# Patient Record
Sex: Female | Born: 1960
Health system: Southern US, Community
[De-identification: ages and names within clinical notes are randomized; demographics above are authoritative.]

## PROBLEM LIST (undated history)

## (undated) ENCOUNTER — Emergency Department (HOSPITAL_COMMUNITY): Payer: Self-pay | Source: Home / Self Care

## (undated) DIAGNOSIS — I471 Supraventricular tachycardia, unspecified: Secondary | ICD-10-CM

## (undated) DIAGNOSIS — M5090 Cervical disc disorder, unspecified, unspecified cervical region: Secondary | ICD-10-CM

## (undated) DIAGNOSIS — Z9889 Other specified postprocedural states: Secondary | ICD-10-CM

## (undated) DIAGNOSIS — G4730 Sleep apnea, unspecified: Secondary | ICD-10-CM

## (undated) DIAGNOSIS — G629 Polyneuropathy, unspecified: Secondary | ICD-10-CM

## (undated) DIAGNOSIS — E119 Type 2 diabetes mellitus without complications: Secondary | ICD-10-CM

## (undated) DIAGNOSIS — I252 Old myocardial infarction: Secondary | ICD-10-CM

## (undated) DIAGNOSIS — S3210XA Unspecified fracture of sacrum, initial encounter for closed fracture: Secondary | ICD-10-CM

## (undated) DIAGNOSIS — M4800 Spinal stenosis, site unspecified: Secondary | ICD-10-CM

## (undated) DIAGNOSIS — D649 Anemia, unspecified: Secondary | ICD-10-CM

## (undated) DIAGNOSIS — S2239XA Fracture of one rib, unspecified side, initial encounter for closed fracture: Secondary | ICD-10-CM

## (undated) DIAGNOSIS — I10 Essential (primary) hypertension: Secondary | ICD-10-CM

## (undated) DIAGNOSIS — I509 Heart failure, unspecified: Secondary | ICD-10-CM

## (undated) DIAGNOSIS — I890 Lymphedema, not elsewhere classified: Secondary | ICD-10-CM

## (undated) DIAGNOSIS — J449 Chronic obstructive pulmonary disease, unspecified: Secondary | ICD-10-CM

## (undated) DIAGNOSIS — K219 Gastro-esophageal reflux disease without esophagitis: Secondary | ICD-10-CM

## (undated) HISTORY — DX: Polyneuropathy, unspecified: G62.9

## (undated) HISTORY — DX: Gastro-esophageal reflux disease without esophagitis: K21.9

## (undated) HISTORY — DX: Cervical disc disorder, unspecified, unspecified cervical region: M50.90

## (undated) HISTORY — DX: Spinal stenosis, site unspecified: M48.00

## (undated) HISTORY — DX: Other specified postprocedural states: Z98.890

## (undated) HISTORY — DX: Essential (primary) hypertension: I10

## (undated) HISTORY — DX: Supraventricular tachycardia: I47.1

## (undated) HISTORY — DX: Supraventricular tachycardia, unspecified: I47.10

## (undated) HISTORY — DX: Old myocardial infarction: I25.2

---

## 2000-12-20 ENCOUNTER — Emergency Department (HOSPITAL_COMMUNITY): Admission: EM | Admit: 2000-12-20 | Discharge: 2000-12-20 | Payer: Self-pay | Admitting: *Deleted

## 2000-12-20 ENCOUNTER — Encounter: Payer: Self-pay | Admitting: *Deleted

## 2001-11-22 ENCOUNTER — Emergency Department (HOSPITAL_COMMUNITY): Admission: EM | Admit: 2001-11-22 | Discharge: 2001-11-23 | Payer: Self-pay

## 2002-12-03 ENCOUNTER — Encounter: Payer: Self-pay | Admitting: *Deleted

## 2002-12-03 ENCOUNTER — Emergency Department (HOSPITAL_COMMUNITY): Admission: EM | Admit: 2002-12-03 | Discharge: 2002-12-03 | Payer: Self-pay | Admitting: Emergency Medicine

## 2007-05-29 ENCOUNTER — Emergency Department (HOSPITAL_COMMUNITY): Admission: EM | Admit: 2007-05-29 | Discharge: 2007-05-29 | Payer: Self-pay | Admitting: Emergency Medicine

## 2009-07-29 ENCOUNTER — Emergency Department (HOSPITAL_COMMUNITY): Admission: EM | Admit: 2009-07-29 | Discharge: 2009-07-29 | Payer: Self-pay | Admitting: Emergency Medicine

## 2009-09-05 ENCOUNTER — Emergency Department (HOSPITAL_COMMUNITY): Admission: EM | Admit: 2009-09-05 | Discharge: 2009-09-05 | Payer: Self-pay | Admitting: Emergency Medicine

## 2010-04-25 ENCOUNTER — Encounter: Payer: Self-pay | Admitting: General Surgery

## 2010-06-21 LAB — BASIC METABOLIC PANEL
BUN: 9 mg/dL (ref 6–23)
CO2: 26 mEq/L (ref 19–32)
Calcium: 9.9 mg/dL (ref 8.4–10.5)
Chloride: 100 mEq/L (ref 96–112)
Creatinine, Ser: 0.63 mg/dL (ref 0.4–1.2)
GFR calc Af Amer: >60 mL/min (ref >60)
GFR calc non Af Amer: >60 mL/min (ref >60)
Glucose, Bld: 111 mg/dL — ABNORMAL HIGH (ref 70–99)
Potassium: 3 mEq/L — ABNORMAL LOW (ref 3.5–5.1)
Sodium: 136 mEq/L (ref 135–145)

## 2010-06-21 LAB — DIFFERENTIAL
Basophils Absolute: 0.1 10*3/uL (ref 0.0–0.1)
Basophils Relative: 1 % (ref 0–1)
Eosinophils Absolute: 0.5 10*3/uL (ref 0.0–0.7)
Eosinophils Relative: 5 % (ref 0–5)
Lymphocytes Relative: 23 % (ref 12–46)
Lymphs Abs: 2.1 10*3/uL (ref 0.7–4.0)
Monocytes Absolute: 0.7 10*3/uL (ref 0.1–1.0)
Monocytes Relative: 7 % (ref 3–12)
Neutro Abs: 5.9 10*3/uL (ref 1.7–7.7)
Neutrophils Relative %: 65 % (ref 43–77)

## 2010-06-21 LAB — CK TOTAL AND CKMB (NOT AT ARMC)
CK, MB: 0.7 ng/mL (ref 0.3–4.0)
Relative Index: INVALID (ref 0.0–2.5)
Total CK: 51 U/L (ref 7–177)

## 2010-06-21 LAB — CBC
HCT: 40.4 % (ref 36.0–46.0)
Hemoglobin: 14.2 g/dL (ref 12.0–15.0)
MCHC: 35.1 g/dL (ref 30.0–36.0)
MCV: 98.8 fL (ref 78.0–100.0)
Platelets: 283 10*3/uL (ref 150–400)
RBC: 4.09 MIL/uL (ref 3.87–5.11)
RDW: 13.5 % (ref 11.5–15.5)
WBC: 9.1 10*3/uL (ref 4.0–10.5)

## 2010-06-21 LAB — POCT CARDIAC MARKERS
CKMB, poc: <1 ng/mL — ABNORMAL LOW (ref 1.0–8.0)
Myoglobin, poc: 43.4 ng/mL (ref 12–200)
Troponin i, poc: <0.05 ng/mL (ref 0.00–0.09)

## 2010-06-21 LAB — D-DIMER, QUANTITATIVE: D-Dimer, Quant: 0.51 ug/mL-FEU — ABNORMAL HIGH (ref 0.00–0.48)

## 2010-06-21 LAB — TROPONIN I: Troponin I: <0.01 ng/mL (ref 0.00–0.06)

## 2010-06-22 LAB — DIFFERENTIAL
Basophils Absolute: 0 10*3/uL (ref 0.0–0.1)
Basophils Relative: 0 % (ref 0–1)
Eosinophils Absolute: 0.5 10*3/uL (ref 0.0–0.7)
Eosinophils Relative: 4 % (ref 0–5)
Lymphocytes Relative: 30 % (ref 12–46)
Lymphs Abs: 3.4 10*3/uL (ref 0.7–4.0)
Monocytes Absolute: 0.6 10*3/uL (ref 0.1–1.0)
Monocytes Relative: 5 % (ref 3–12)
Neutro Abs: 6.8 10*3/uL (ref 1.7–7.7)
Neutrophils Relative %: 61 % (ref 43–77)

## 2010-06-22 LAB — URINALYSIS, ROUTINE W REFLEX MICROSCOPIC
Bilirubin Urine: NEGATIVE
Glucose, UA: NEGATIVE mg/dL
Ketones, ur: NEGATIVE mg/dL
Leukocytes, UA: NEGATIVE
Nitrite: NEGATIVE
Protein, ur: NEGATIVE mg/dL
Specific Gravity, Urine: 1.02 (ref 1.005–1.030)
Urobilinogen, UA: 1 mg/dL (ref 0.0–1.0)
pH: 7 (ref 5.0–8.0)

## 2010-06-22 LAB — COMPREHENSIVE METABOLIC PANEL
ALT: 14 U/L (ref 0–35)
AST: 19 U/L (ref 0–37)
Albumin: 4.4 g/dL (ref 3.5–5.2)
Alkaline Phosphatase: 43 U/L (ref 39–117)
BUN: 9 mg/dL (ref 6–23)
CO2: 22 mEq/L (ref 19–32)
Calcium: 9.7 mg/dL (ref 8.4–10.5)
Chloride: 106 mEq/L (ref 96–112)
Creatinine, Ser: 0.62 mg/dL (ref 0.4–1.2)
GFR calc Af Amer: >60 mL/min (ref >60)
GFR calc non Af Amer: >60 mL/min (ref >60)
Glucose, Bld: 107 mg/dL — ABNORMAL HIGH (ref 70–99)
Potassium: 3.7 mEq/L (ref 3.5–5.1)
Sodium: 138 mEq/L (ref 135–145)
Total Bilirubin: 1 mg/dL (ref 0.3–1.2)
Total Protein: 7.1 g/dL (ref 6.0–8.3)

## 2010-06-22 LAB — URINE MICROSCOPIC-ADD ON

## 2010-06-22 LAB — CBC
HCT: 40.7 % (ref 36.0–46.0)
Hemoglobin: 14.4 g/dL (ref 12.0–15.0)
MCHC: 35.4 g/dL (ref 30.0–36.0)
MCV: 97 fL (ref 78.0–100.0)
Platelets: 219 10*3/uL (ref 150–400)
RBC: 4.2 MIL/uL (ref 3.87–5.11)
RDW: 13.3 % (ref 11.5–15.5)
WBC: 11.2 10*3/uL — ABNORMAL HIGH (ref 4.0–10.5)

## 2010-06-22 LAB — SEDIMENTATION RATE: Sed Rate: 3 mm/hr (ref 0–22)

## 2011-06-01 ENCOUNTER — Encounter (HOSPITAL_COMMUNITY): Payer: Self-pay | Admitting: Emergency Medicine

## 2011-06-01 ENCOUNTER — Emergency Department (HOSPITAL_COMMUNITY)
Admission: EM | Admit: 2011-06-01 | Discharge: 2011-06-01 | Disposition: A | Discharge status: Home / Self Care | Payer: Self-pay | Attending: Emergency Medicine | Admitting: Emergency Medicine

## 2011-06-01 ENCOUNTER — Other Ambulatory Visit: Payer: Self-pay

## 2011-06-01 ENCOUNTER — Emergency Department (HOSPITAL_COMMUNITY): Payer: Self-pay

## 2011-06-01 DIAGNOSIS — Z87891 Personal history of nicotine dependence: Secondary | ICD-10-CM | POA: Insufficient documentation

## 2011-06-01 DIAGNOSIS — I498 Other specified cardiac arrhythmias: Secondary | ICD-10-CM | POA: Insufficient documentation

## 2011-06-01 DIAGNOSIS — I10 Essential (primary) hypertension: Secondary | ICD-10-CM | POA: Insufficient documentation

## 2011-06-01 DIAGNOSIS — J4489 Other specified chronic obstructive pulmonary disease: Secondary | ICD-10-CM | POA: Insufficient documentation

## 2011-06-01 DIAGNOSIS — R072 Precordial pain: Secondary | ICD-10-CM | POA: Insufficient documentation

## 2011-06-01 DIAGNOSIS — J449 Chronic obstructive pulmonary disease, unspecified: Secondary | ICD-10-CM | POA: Insufficient documentation

## 2011-06-01 HISTORY — DX: Chronic obstructive pulmonary disease, unspecified: J44.9

## 2011-06-01 LAB — LIPASE, BLOOD: Lipase: 45 U/L (ref 11–59)

## 2011-06-01 LAB — COMPREHENSIVE METABOLIC PANEL
ALT: 18 U/L (ref 0–35)
AST: 18 U/L (ref 0–37)
Albumin: 4.4 g/dL (ref 3.5–5.2)
Alkaline Phosphatase: 59 U/L (ref 39–117)
BUN: 11 mg/dL (ref 6–23)
CO2: 21 mEq/L (ref 19–32)
Calcium: 9.6 mg/dL (ref 8.4–10.5)
Chloride: 102 mEq/L (ref 96–112)
Creatinine, Ser: 0.68 mg/dL (ref 0.50–1.10)
GFR calc Af Amer: >90 mL/min (ref >90)
GFR calc non Af Amer: >90 mL/min (ref >90)
Glucose, Bld: 101 mg/dL — ABNORMAL HIGH (ref 70–99)
Potassium: 3.3 mEq/L — ABNORMAL LOW (ref 3.5–5.1)
Sodium: 138 mEq/L (ref 135–145)
Total Bilirubin: 0.6 mg/dL (ref 0.3–1.2)
Total Protein: 7.9 g/dL (ref 6.0–8.3)

## 2011-06-01 LAB — CBC
HCT: 40.8 % (ref 36.0–46.0)
Hemoglobin: 14.1 g/dL (ref 12.0–15.0)
MCH: 32.7 pg (ref 26.0–34.0)
MCHC: 34.6 g/dL (ref 30.0–36.0)
MCV: 94.7 fL (ref 78.0–100.0)
Platelets: 262 10*3/uL (ref 150–400)
RBC: 4.31 MIL/uL (ref 3.87–5.11)
RDW: 12.4 % (ref 11.5–15.5)
WBC: 9.3 10*3/uL (ref 4.0–10.5)

## 2011-06-01 LAB — DIFFERENTIAL
Basophils Absolute: 0 10*3/uL (ref 0.0–0.1)
Basophils Relative: 0 % (ref 0–1)
Eosinophils Absolute: 0.1 10*3/uL (ref 0.0–0.7)
Eosinophils Relative: 1 % (ref 0–5)
Lymphocytes Relative: 26 % (ref 12–46)
Lymphs Abs: 2.4 10*3/uL (ref 0.7–4.0)
Monocytes Absolute: 0.7 10*3/uL (ref 0.1–1.0)
Monocytes Relative: 8 % (ref 3–12)
Neutro Abs: 6.1 10*3/uL (ref 1.7–7.7)
Neutrophils Relative %: 66 % (ref 43–77)

## 2011-06-01 LAB — D-DIMER, QUANTITATIVE: D-Dimer, Quant: 0.38 ug/mL-FEU (ref 0.00–0.48)

## 2011-06-01 MED ORDER — LORAZEPAM 2 MG/ML IJ SOLN
1.0000 mg | Freq: Once | INTRAMUSCULAR | Status: AC
  Administered 2011-06-01: 1 mg via INTRAVENOUS
  Filled 2011-06-01: qty 1

## 2011-06-01 MED ORDER — SODIUM CHLORIDE 0.9 % IV SOLN
Freq: Once | INTRAVENOUS | Status: AC
  Administered 2011-06-01: 1000 mL via INTRAVENOUS

## 2011-06-01 MED ORDER — ASPIRIN 325 MG PO TABS
325.0000 mg | ORAL_TABLET | Freq: Once | ORAL | Status: AC
  Administered 2011-06-01: 325 mg via ORAL
  Filled 2011-06-01: qty 1

## 2011-06-01 NOTE — ED Notes (Signed)
Patient with no complaints at this time. Respirations even and unlabored. Skin warm/dry. Discharge instructions reviewed with patient at this time. Patient given opportunity to voice concerns/ask questions. IV removed per policy and band-aid applied to site. Patient discharged at this time and left Emergency Department with steady gait.  

## 2011-06-01 NOTE — ED Provider Notes (Signed)
History     CSN: [id number]  Arrival date & time 06/01/11  1647   First MD Initiated Contact with Patient 06/01/11 1700      Chief Complaint  Patient presents with  . Chest Pain    (Consider location/radiation/quality/duration/timing/severity/associated sxs/prior treatment) Patient is a 51 y.o. female presenting with chest pain. The history is provided by the patient.  Chest Pain    patient presents with chest burning and heaviness x3 days which is intermittent. Located substernal with radiation to her neck. Symptoms are worse with exertion and leaning forward made better with nothing. Patient states that she has increased anxiety. Denies any leg pain or swelling. Some cough without fever. History of similar symptoms in the past but has not sought medical attention for this. Does have a strong family history of premature coronary disease mother had her first MI at age 56. She also has a history of tobacco use, high cholesterol and hypertension.  Past Medical History  Diagnosis Date  . COPD (chronic obstructive pulmonary disease)   . Hypertension     Past Surgical History  Procedure Date  . Cesarean section     No family history on file.  History  Substance Use Topics  . Smoking status: Former Games developer  . Smokeless tobacco: Not on file  . Alcohol Use: Yes     occasionally    OB History    Grav Para Term Preterm Abortions TAB SAB Ect Mult Living                  Review of Systems  Cardiovascular: Positive for chest pain.  All other systems reviewed and are negative.    Allergies  Review of patient's allergies indicates no known allergies.  Home Medications  No current outpatient prescriptions on file.  BP 132/67  Pulse 123  Temp(Src) 97.9 F (36.6 C) (Oral)  Resp 26  Ht 5\' 1"  (1.549 m)  Wt 180 lb (81.647 kg)  BMI 34.01 kg/m2  SpO2 100%  LMP 05/16/2011  Physical Exam  Nursing note and vitals reviewed. Constitutional: She is oriented to person,  place, and time. She appears well-developed and well-nourished.  Non-toxic appearance. No distress.  HENT:  Head: Normocephalic and atraumatic.  Eyes: Conjunctivae, EOM and lids are normal. Pupils are equal, round, and reactive to light.  Neck: Normal range of motion. Neck supple. No tracheal deviation present. No mass present.  Cardiovascular: Regular rhythm and normal heart sounds.  Tachycardia present.  Exam reveals no gallop.   No murmur heard. Pulmonary/Chest: Effort normal and breath sounds normal. No stridor. No respiratory distress. She has no decreased breath sounds. She has no wheezes. She has no rhonchi. She has no rales.  Abdominal: Soft. Normal appearance and bowel sounds are normal. She exhibits no distension. There is no tenderness. There is no rebound and no CVA tenderness.  Musculoskeletal: Normal range of motion. She exhibits no edema and no tenderness.  Neurological: She is alert and oriented to person, place, and time. She has normal strength. No cranial nerve deficit or sensory deficit. GCS eye subscore is 4. GCS verbal subscore is 5. GCS motor subscore is 6.  Skin: Skin is warm and dry. No abrasion and no rash noted.  Psychiatric: Her speech is normal and behavior is normal. Her mood appears anxious.    ED Course  Procedures (including critical care time)   Labs Reviewed  CBC  DIFFERENTIAL  COMPREHENSIVE METABOLIC PANEL   No results found.   No diagnosis  found.    MDM   Date: 06/01/2011  Rate: 114  Rhythm: sinus tachycardia  QRS Axis: normal  Intervals: normal  ST/T Wave abnormalities: nonspecific ST changes  Conduction Disutrbances:none  Narrative Interpretation:   Old EKG Reviewed: unchanged 6:40 PM Patient offered admission and has deferred this time. Risk of sudden cardiac death explained to her and she understands this. She was given Ativan and feels better. Heart is improved. Cardiac enzymes and d-dimer both negative. Suspect that patient has  anxiety and some component of her current symptoms. However given her many risk factors I felt that she will need to be admitted. She states that she will followup with Dr. Carlena Sax and return if her symptoms become worse         Toy Baker, MD 06/01/11 [phone]

## 2011-06-01 NOTE — ED Notes (Signed)
Patient reports chest pain (burning/heaviness) x 3 days intermittently. States "my heart beats erratically at times" Patient appears anxious.

## 2011-06-02 LAB — POCT I-STAT TROPONIN I: Troponin i, poc: 0 ng/mL (ref 0.00–0.08)

## 2011-10-25 ENCOUNTER — Other Ambulatory Visit (HOSPITAL_COMMUNITY): Payer: Self-pay | Admitting: Nurse Practitioner

## 2011-10-25 DIAGNOSIS — Z139 Encounter for screening, unspecified: Secondary | ICD-10-CM

## 2011-10-31 ENCOUNTER — Ambulatory Visit (HOSPITAL_COMMUNITY): Payer: Self-pay

## 2011-11-07 ENCOUNTER — Ambulatory Visit (HOSPITAL_COMMUNITY)
Admission: RE | Admit: 2011-11-07 | Discharge: 2011-11-07 | Disposition: A | Discharge status: Home / Self Care | Payer: PRIVATE HEALTH INSURANCE | Source: Ambulatory Visit | Attending: Nurse Practitioner | Admitting: Nurse Practitioner

## 2011-11-07 DIAGNOSIS — Z139 Encounter for screening, unspecified: Secondary | ICD-10-CM

## 2011-11-07 DIAGNOSIS — Z1231 Encounter for screening mammogram for malignant neoplasm of breast: Secondary | ICD-10-CM | POA: Insufficient documentation

## 2011-11-10 ENCOUNTER — Other Ambulatory Visit: Payer: Self-pay | Admitting: Nurse Practitioner

## 2011-11-10 DIAGNOSIS — R928 Other abnormal and inconclusive findings on diagnostic imaging of breast: Secondary | ICD-10-CM

## 2011-11-15 ENCOUNTER — Other Ambulatory Visit (HOSPITAL_COMMUNITY): Payer: Self-pay | Admitting: Nurse Practitioner

## 2011-11-15 DIAGNOSIS — R928 Other abnormal and inconclusive findings on diagnostic imaging of breast: Secondary | ICD-10-CM

## 2011-11-16 ENCOUNTER — Ambulatory Visit (HOSPITAL_COMMUNITY)
Admission: RE | Admit: 2011-11-16 | Discharge: 2011-11-16 | Disposition: A | Discharge status: Home / Self Care | Payer: PRIVATE HEALTH INSURANCE | Source: Ambulatory Visit | Attending: Nurse Practitioner | Admitting: Nurse Practitioner

## 2011-11-16 DIAGNOSIS — R928 Other abnormal and inconclusive findings on diagnostic imaging of breast: Secondary | ICD-10-CM

## 2011-11-23 ENCOUNTER — Encounter (HOSPITAL_COMMUNITY): Payer: Self-pay

## 2012-08-08 ENCOUNTER — Other Ambulatory Visit (HOSPITAL_COMMUNITY): Payer: Self-pay | Admitting: Internal Medicine

## 2012-08-08 DIAGNOSIS — M4802 Spinal stenosis, cervical region: Secondary | ICD-10-CM

## 2012-08-10 ENCOUNTER — Ambulatory Visit (HOSPITAL_COMMUNITY)
Admission: RE | Admit: 2012-08-10 | Discharge: 2012-08-10 | Disposition: A | Discharge status: Home / Self Care | Payer: BC Managed Care – PPO | Source: Ambulatory Visit | Attending: Internal Medicine | Admitting: Internal Medicine

## 2012-08-10 DIAGNOSIS — M5030 Other cervical disc degeneration, unspecified cervical region: Secondary | ICD-10-CM | POA: Insufficient documentation

## 2012-08-10 DIAGNOSIS — M542 Cervicalgia: Secondary | ICD-10-CM | POA: Insufficient documentation

## 2012-08-10 DIAGNOSIS — M4802 Spinal stenosis, cervical region: Secondary | ICD-10-CM

## 2012-08-10 DIAGNOSIS — M5380 Other specified dorsopathies, site unspecified: Secondary | ICD-10-CM | POA: Insufficient documentation

## 2012-11-12 ENCOUNTER — Other Ambulatory Visit: Payer: Self-pay | Admitting: *Deleted

## 2012-11-12 ENCOUNTER — Other Ambulatory Visit (HOSPITAL_COMMUNITY): Payer: Self-pay | Admitting: Cardiovascular Disease

## 2012-11-12 DIAGNOSIS — R011 Cardiac murmur, unspecified: Secondary | ICD-10-CM

## 2012-11-12 DIAGNOSIS — R079 Chest pain, unspecified: Secondary | ICD-10-CM

## 2012-11-13 ENCOUNTER — Telehealth (HOSPITAL_COMMUNITY): Payer: Self-pay | Admitting: Cardiovascular Disease

## 2012-11-13 NOTE — Telephone Encounter (Signed)
Left message for patient to call back regarding scheduling appt

## 2012-11-14 ENCOUNTER — Encounter: Payer: Self-pay | Admitting: Cardiovascular Disease

## 2012-11-20 ENCOUNTER — Telehealth (HOSPITAL_COMMUNITY): Payer: Self-pay | Admitting: Cardiovascular Disease

## 2012-12-06 ENCOUNTER — Telehealth (HOSPITAL_COMMUNITY): Payer: Self-pay | Admitting: Cardiovascular Disease

## 2013-01-25 ENCOUNTER — Telehealth (HOSPITAL_COMMUNITY): Payer: Self-pay | Admitting: *Deleted

## 2013-05-31 ENCOUNTER — Emergency Department (HOSPITAL_COMMUNITY): Payer: BC Managed Care – PPO

## 2013-05-31 ENCOUNTER — Encounter (HOSPITAL_COMMUNITY): Payer: Self-pay | Admitting: Emergency Medicine

## 2013-05-31 ENCOUNTER — Inpatient Hospital Stay (HOSPITAL_COMMUNITY)
Admission: EM | Admit: 2013-05-31 | Discharge: 2013-06-03 | DRG: 287 | Disposition: A | Discharge status: Home / Self Care | Payer: BC Managed Care – PPO | Attending: Internal Medicine | Admitting: Internal Medicine

## 2013-05-31 DIAGNOSIS — K219 Gastro-esophageal reflux disease without esophagitis: Secondary | ICD-10-CM | POA: Diagnosis present

## 2013-05-31 DIAGNOSIS — G609 Hereditary and idiopathic neuropathy, unspecified: Secondary | ICD-10-CM | POA: Diagnosis present

## 2013-05-31 DIAGNOSIS — I471 Supraventricular tachycardia, unspecified: Secondary | ICD-10-CM | POA: Insufficient documentation

## 2013-05-31 DIAGNOSIS — I209 Angina pectoris, unspecified: Secondary | ICD-10-CM | POA: Diagnosis present

## 2013-05-31 DIAGNOSIS — Z8249 Family history of ischemic heart disease and other diseases of the circulatory system: Secondary | ICD-10-CM

## 2013-05-31 DIAGNOSIS — E669 Obesity, unspecified: Secondary | ICD-10-CM | POA: Diagnosis present

## 2013-05-31 DIAGNOSIS — J4489 Other specified chronic obstructive pulmonary disease: Secondary | ICD-10-CM

## 2013-05-31 DIAGNOSIS — I498 Other specified cardiac arrhythmias: Principal | ICD-10-CM

## 2013-05-31 DIAGNOSIS — F411 Generalized anxiety disorder: Secondary | ICD-10-CM | POA: Diagnosis present

## 2013-05-31 DIAGNOSIS — G629 Polyneuropathy, unspecified: Secondary | ICD-10-CM

## 2013-05-31 DIAGNOSIS — Z833 Family history of diabetes mellitus: Secondary | ICD-10-CM

## 2013-05-31 DIAGNOSIS — Z7982 Long term (current) use of aspirin: Secondary | ICD-10-CM

## 2013-05-31 DIAGNOSIS — I200 Unstable angina: Secondary | ICD-10-CM

## 2013-05-31 DIAGNOSIS — Z87891 Personal history of nicotine dependence: Secondary | ICD-10-CM

## 2013-05-31 DIAGNOSIS — E7800 Pure hypercholesterolemia, unspecified: Secondary | ICD-10-CM | POA: Diagnosis present

## 2013-05-31 DIAGNOSIS — J449 Chronic obstructive pulmonary disease, unspecified: Secondary | ICD-10-CM | POA: Insufficient documentation

## 2013-05-31 DIAGNOSIS — M4802 Spinal stenosis, cervical region: Secondary | ICD-10-CM | POA: Diagnosis present

## 2013-05-31 DIAGNOSIS — I10 Essential (primary) hypertension: Secondary | ICD-10-CM

## 2013-05-31 DIAGNOSIS — R748 Abnormal levels of other serum enzymes: Secondary | ICD-10-CM | POA: Diagnosis present

## 2013-05-31 DIAGNOSIS — E785 Hyperlipidemia, unspecified: Secondary | ICD-10-CM | POA: Diagnosis present

## 2013-05-31 DIAGNOSIS — Z79899 Other long term (current) drug therapy: Secondary | ICD-10-CM

## 2013-05-31 DIAGNOSIS — R079 Chest pain, unspecified: Secondary | ICD-10-CM | POA: Diagnosis present

## 2013-05-31 LAB — CBC WITH DIFFERENTIAL/PLATELET
Basophils Absolute: 0 10*3/uL (ref 0.0–0.1)
Basophils Relative: 0 % (ref 0–1)
Eosinophils Absolute: 0.2 10*3/uL (ref 0.0–0.7)
Eosinophils Relative: 2 % (ref 0–5)
HCT: 37.8 % (ref 36.0–46.0)
Hemoglobin: 12.9 g/dL (ref 12.0–15.0)
Lymphocytes Relative: 32 % (ref 12–46)
Lymphs Abs: 3.1 10*3/uL (ref 0.7–4.0)
MCH: 31.5 pg (ref 26.0–34.0)
MCHC: 34.1 g/dL (ref 30.0–36.0)
MCV: 92.4 fL (ref 78.0–100.0)
Monocytes Absolute: 0.9 10*3/uL (ref 0.1–1.0)
Monocytes Relative: 9 % (ref 3–12)
Neutro Abs: 5.4 10*3/uL (ref 1.7–7.7)
Neutrophils Relative %: 57 % (ref 43–77)
Platelets: 257 10*3/uL (ref 150–400)
RBC: 4.09 MIL/uL (ref 3.87–5.11)
RDW: 13.5 % (ref 11.5–15.5)
WBC: 9.5 10*3/uL (ref 4.0–10.5)

## 2013-05-31 LAB — BASIC METABOLIC PANEL
BUN: 16 mg/dL (ref 6–23)
CO2: 28 mEq/L (ref 19–32)
Calcium: 9.5 mg/dL (ref 8.4–10.5)
Chloride: 103 mEq/L (ref 96–112)
Creatinine, Ser: 0.85 mg/dL (ref 0.50–1.10)
GFR calc Af Amer: 90 mL/min — ABNORMAL LOW (ref >90)
GFR calc non Af Amer: 77 mL/min — ABNORMAL LOW (ref >90)
Glucose, Bld: 76 mg/dL (ref 70–99)
Potassium: 3.8 mEq/L (ref 3.7–5.3)
Sodium: 142 mEq/L (ref 137–147)

## 2013-05-31 LAB — TROPONIN I: Troponin I: 0.33 ng/mL (ref <0.30)

## 2013-05-31 MED ORDER — ASPIRIN 81 MG PO CHEW
81.0000 mg | CHEWABLE_TABLET | Freq: Once | ORAL | Status: AC
  Administered 2013-05-31: 81 mg via ORAL
  Filled 2013-05-31: qty 1

## 2013-05-31 MED ORDER — LORAZEPAM 2 MG/ML IJ SOLN
1.0000 mg | Freq: Once | INTRAMUSCULAR | Status: DC
  Filled 2013-05-31: qty 1

## 2013-05-31 MED ORDER — LORAZEPAM 2 MG/ML IJ SOLN
1.0000 mg | Freq: Once | INTRAMUSCULAR | Status: AC
  Administered 2013-05-31: 1 mg via INTRAVENOUS

## 2013-05-31 MED ORDER — METOPROLOL TARTRATE 25 MG PO TABS
25.0000 mg | ORAL_TABLET | Freq: Once | ORAL | Status: AC
  Administered 2013-06-01: 25 mg via ORAL
  Filled 2013-05-31: qty 1

## 2013-05-31 MED ORDER — DEXTROSE 5 % IV SOLN
5.0000 mg/h | INTRAVENOUS | Status: DC
  Filled 2013-05-31: qty 100

## 2013-05-31 MED ORDER — DILTIAZEM LOAD VIA INFUSION
10.0000 mg | Freq: Once | INTRAVENOUS | Status: DC
  Filled 2013-05-31: qty 10

## 2013-05-31 MED ORDER — ACETAMINOPHEN 500 MG PO TABS
1000.0000 mg | ORAL_TABLET | Freq: Once | ORAL | Status: AC
  Administered 2013-05-31: 1000 mg via ORAL
  Filled 2013-05-31: qty 2

## 2013-05-31 NOTE — ED Provider Notes (Signed)
CSN: [id number]     Arrival date & time 05/31/13  1638 History   First MD Initiated Contact with Patient 05/31/13 1650  This chart was scribed for [name], MD by Marica Otter, ED Scribe. This patient was seen in room [facility] and the patient's care was started at 4:52 PM.  Chief Complaint  Patient presents with  . Chest Pain   The history is provided by the patient. No language interpreter was used.   HPI Comments: Gina Bradley is a 53 y.o. female, with HTN and COPD, who presents to the Emergency Department complaining of rapid heart rate, with a HR as high as 200s, onset one hour ago. Pt also complains of associated chest pain, SOB, diaphoresis and "feeling hot." Pt describes the chest pain as "squeezing" and states this is the worst chest pain she has ever experienced. Pt reports she experienced similar, but less severe, episodes in the past and was referred to a cardiologist. However, pt did not f/u with the cardiologist. Pt states that her mother had a massive MI. Pt is a former smoker.   PCP- Dr. Sherwood Gambler  Past Medical History  Diagnosis Date  . COPD (chronic obstructive pulmonary disease)   . Hypertension    Past Surgical History  Procedure Laterality Date  . Cesarean section     No family history on file. History  Substance Use Topics  . Smoking status: Former Games developer  . Smokeless tobacco: Not on file  . Alcohol Use: Yes     Comment: occasionally   OB History   Grav Para Term Preterm Abortions TAB SAB Ect Mult Living                 Review of Systems  Constitutional: Positive for diaphoresis.       "Feeling Hot"   Respiratory: Positive for shortness of breath.   Cardiovascular: Positive for chest pain.       Rapid Heart Rate  All other systems reviewed and are negative.   Allergies  Review of patient's allergies indicates no known allergies.  Home Medications   Current Outpatient Rx  Name  Route  Sig  Dispense  Refill  . albuterol (PROVENTIL  HFA;VENTOLIN HFA) 108 (90 BASE) MCG/ACT inhaler   Inhalation   Inhale 2 puffs into the lungs every 6 (six) hours as needed for wheezing or shortness of breath.         . ALPRAZolam (XANAX) 1 MG tablet   Oral   Take 1 mg by mouth 3 (three) times daily as needed. Anxiety         . atorvastatin (LIPITOR) 20 MG tablet   Oral   Take 20 mg by mouth daily.         [name] gabapentin (NEURONTIN) 100 MG capsule   Oral   Take 100 mg by mouth 3 (three) times daily.         [name] HYDROcodone-acetaminophen (NORCO) 10-325 MG per tablet   Oral   Take 1 tablet by mouth every 6 (six) hours as needed. pain         . losartan (COZAAR) 100 MG tablet   Oral   Take 50 mg by mouth daily.         . pantoprazole (PROTONIX) 40 MG tablet   Oral   Take 40 mg by mouth 2 (two) times daily.          Vital Signs: BP 109/76  Pulse 182  Resp 36  Wt 185 lb (  83.915 kg)  SpO2 100%  LMP 05/05/2013 Physical Exam  Nursing note and vitals reviewed. Constitutional: She is oriented to person, place, and time. She appears well-developed and well-nourished.  anxious  HENT:  Head: Normocephalic and atraumatic.  Eyes: Conjunctivae and EOM are normal. Pupils are equal, round, and reactive to light.  Neck: Normal range of motion. Neck supple.  Cardiovascular: Regular rhythm and normal heart sounds.   tachycardic  Pulmonary/Chest: Effort normal and breath sounds normal.  Abdominal: Soft. Bowel sounds are normal.  Musculoskeletal: Normal range of motion.  Neurological: She is alert and oriented to person, place, and time.  Skin: Skin is warm and dry.  Psychiatric: She has a normal mood and affect. Her behavior is normal.    ED Course  Procedures (including critical care time) DIAGNOSTIC STUDIES: Oxygen Saturation is 100% on RA, normal by my interpretation.    COORDINATION OF CARE:  4:57 PM- Discussed treatment plan which includes administering cardizem. Also ordering labs, EKG, possible admission and  performed carotid massage. Pt agrees with plan.  Labs Review Labs Reviewed  BASIC METABOLIC PANEL - Abnormal; Notable for the following:    GFR calc non Af Amer 77 (*)    GFR calc Af Amer 90 (*)    All other components within normal limits  TROPONIN I - Abnormal; Notable for the following:    Troponin I 0.33 (*)    All other components within normal limits  CBC WITH DIFFERENTIAL   Imaging Review Dg Chest Portable 1 View  05/31/2013   CLINICAL DATA:  Chest pain for 2 days.  EXAM: PORTABLE CHEST - 1 VIEW  COMPARISON:  06/01/2011  FINDINGS: Normal heart, mediastinum hila. There is prominent cardiophrenic angle fat on the right, stable. Clear lungs. No pleural effusion or pneumothorax. The bony thorax is intact.  IMPRESSION: No active disease.   Electronically Signed   By: Amie Portlandavid  Ormond M.D.   On: 05/31/2013 17:42     EKG Interpretation   Date/Time:  Friday May 31 2013 17:31:58 EST Ventricular Rate:  107 PR Interval:  166 QRS Duration: 72 QT Interval:  342 QTC Calculation: 456 R Axis:   71 Text Interpretation:  Sinus tachycardia Possible Left atrial enlargement  Borderline ECG When compared with ECG of 31-May-2013 17:02, Vent. rate has  decreased BY  59 BPM QRS duration has decreased ST no longer depressed in  Inferior leads ST elevation has replaced ST depression in Lateral leads  Confirmed by Arron Mcnaught  MD, Escher Harr ([id number]) on 05/31/2013 6:55:03 PM     CRITICAL CARE Performed by: Donnetta HutchingOOK,Braeden Kennan Total critical care time: 30 Critical care time was exclusive of separately billable procedures and treating other patients. Critical care was necessary to treat or prevent imminent or life-threatening deterioration. Critical care was time spent personally by me on the following activities: development of treatment plan with patient and/or surrogate as well as nursing, discussions with consultants, evaluation of patient's response to treatment, examination of patient, obtaining history from  patient or surrogate, ordering and performing treatments and interventions, ordering and review of laboratory studies, ordering and review of radiographic studies, pulse oximetry and re-evaluation of patient's condition. MDM   Final diagnoses:  Supraventricular tachycardia    Patient presents with supraventricular tachycardia rate 150-190 c associated chest pain and dyspnea. Patient converted to normal sinus rhythm with carotid massage followed by placement of intravenous catheter. Troponin mildly elevated at 0.33    Discussed with hospitalist Dr. Lindwood QuaKrishanan and [facility] Tresa EndoKelly.  Will transfer to [facility] under the hospitalist service with cardiology consult.  I personally performed the services described in this documentation, which was scribed in my presence. The recorded information has been reviewed and is accurate.   [name], MD 05/31/13 2202

## 2013-05-31 NOTE — H&P (Addendum)
[facility] History and Physical  Gina Bradley:[id number] DOB: Dec 31, 1960 DOA: 05/31/2013   PCP: Glo Herring., MD  Specialists: Patient has been referred to cardiology in the past, but she's never kept those appointments  Chief Complaint: Heart was racing, along with chest pain  HPI: Gina Bradley is a 53 y.o. female with a past medical history of hypercholesterolemia, hypertension, COPD, peripheral neuropathy, cervical spine stenosis, who was in her usual state of health earlier today when she noticed that her heart was racing periodically. It would race for a few minutes and then subside on its own. And, then about 30 minutes prior to arrival to the emergency department her heart started racing again, but this time it did not resolve. She started getting dizzy. She started having chest pain, which was in the retrosternal area. Pain was a pressure-like sensation 9/10 in intensity with choking sensation. She got nauseas, and she started sweating. She felt hot. She then decided to come in to the hospital and almost had a passing out episode, although she didn't quite pass out. She has been short of breath as well. She also tells me that she does develop chest discomfort and a suffocating feeling with exertion, which has been ongoing for the last many months. Denies any fever or chills. No sick contacts recently. Has noted leg swelling, which has been ongoing for many months as well. She's never had a cardiac stress test or a cardiac catheterization in the past.  Home Medications: Prior to Admission medications   Medication Sig Start Date End Date Taking? Authorizing Provider  albuterol (PROVENTIL HFA;VENTOLIN HFA) 108 (90 BASE) MCG/ACT inhaler Inhale 2 puffs into the lungs every 6 (six) hours as needed for wheezing or shortness of breath.   Yes Historical Provider, MD  ALPRAZolam Duanne Moron) 1 MG tablet Take 1 mg by mouth 3 (three) times daily as needed. Anxiety 05/03/13  Yes  Historical Provider, MD  atorvastatin (LIPITOR) 20 MG tablet Take 20 mg by mouth daily. 03/04/13  Yes Historical Provider, MD  gabapentin (NEURONTIN) 100 MG capsule Take 100 mg by mouth 3 (three) times daily. 05/25/13  Yes Historical Provider, MD  HYDROcodone-acetaminophen (NORCO) 10-325 MG per tablet Take 1 tablet by mouth every 6 (six) hours as needed. pain 05/03/13  Yes Historical Provider, MD  losartan (COZAAR) 100 MG tablet Take 50 mg by mouth daily. 04/18/13  Yes Historical Provider, MD  pantoprazole (PROTONIX) 40 MG tablet Take 40 mg by mouth 2 (two) times daily. 05/25/13  Yes Historical Provider, MD    Allergies: No Known Allergies  Past Medical History: Past Medical History  Diagnosis Date  . COPD (chronic obstructive pulmonary disease)   . Hypertension     Past Surgical History  Procedure Laterality Date  . Cesarean section      Social History: Patient lives with her husband and son. Quit smoking in 2010. Has about 25 pack year history of smoking. She works in home health care. No illicit drug use, usually independent with daily activities.  Family History:  Family History  Problem Relation Age of Onset  . Heart attack Mother   . Diabetes Mother   . Diabetes Father   . Suicidality Sister      Review of Systems - History obtained from the patient General ROS: positive for  - fatigue Psychological ROS: positive for - anxiety Ophthalmic ROS: negative ENT ROS: negative Allergy and Immunology ROS: negative Hematological and Lymphatic ROS: negative Endocrine ROS: negative Respiratory ROS: as in  hpi Cardiovascular ROS: as in hpi Gastrointestinal ROS: no abdominal pain, change in bowel habits, or black or bloody stools Genito-Urinary ROS: no dysuria, trouble voiding, or hematuria Musculoskeletal ROS: negative Neurological ROS: positive for - neuropathy Dermatological ROS: negative  Physical Examination  Filed Vitals:   05/31/13 2000 05/31/13 2030 05/31/13 2100  05/31/13 2130  BP: 93/76 94/70 111/88 129/56  Pulse: 84  83 89  Resp: 17 16 16 15   Weight:      SpO2: 97%  98% 98%    BP 129/56  Pulse 89  Resp 15  Wt 83.915 kg (185 lb)  SpO2 98%  LMP 05/05/2013  General appearance: alert, cooperative, appears stated age, no distress and moderately obese Head: Normocephalic, without obvious abnormality, atraumatic Eyes: conjunctivae/corneas clear. PERRL, EOM's intact. Throat: lips, mucosa, and tongue normal; teeth and gums normal Neck: no adenopathy, no carotid bruit, no JVD, supple, symmetrical, trachea midline and thyroid not enlarged, symmetric, no tenderness/mass/nodules Resp: clear to auscultation bilaterally Cardio: regular rate and rhythm, S1, S2 normal, no murmur, click, rub or gallop GI: soft, non-tender; bowel sounds normal; no masses,  no organomegaly Extremities: extremities normal, atraumatic, no cyanosis. Minimal edema bil LE Pulses: 2+ and symmetric Skin: Skin color, texture, turgor normal. No rashes or lesions Lymph nodes: Cervical, supraclavicular, and axillary nodes normal. Neurologic: No focal deficits  Laboratory Data: Results for orders placed during the hospital encounter of 05/31/13 (from the past 48 hour(s))  BASIC METABOLIC PANEL     Status: Abnormal   Collection Time    05/31/13  6:58 PM      Result Value Ref Range   Sodium 142  137 - 147 mEq/L   Potassium 3.8  3.7 - 5.3 mEq/L   Chloride 103  96 - 112 mEq/L   CO2 28  19 - 32 mEq/L   Glucose, Bld 76  70 - 99 mg/dL   BUN 16  6 - 23 mg/dL   Creatinine, Ser 0.85  0.50 - 1.10 mg/dL   Calcium 9.5  8.4 - 10.5 mg/dL   GFR calc non Af Amer 77 (*) >90 mL/min   GFR calc Af Amer 90 (*) >90 mL/min   Comment: (NOTE)     The eGFR has been calculated using the CKD EPI equation.     This calculation has not been validated in all clinical situations.     eGFR's persistently <90 mL/min signify possible Chronic Kidney     Disease.  CBC WITH DIFFERENTIAL     Status: None    Collection Time    05/31/13  6:58 PM      Result Value Ref Range   WBC 9.5  4.0 - 10.5 K/uL   RBC 4.09  3.87 - 5.11 MIL/uL   Hemoglobin 12.9  12.0 - 15.0 g/dL   HCT 37.8  36.0 - 46.0 %   MCV 92.4  78.0 - 100.0 fL   MCH 31.5  26.0 - 34.0 pg   MCHC 34.1  30.0 - 36.0 g/dL   RDW 13.5  11.5 - 15.5 %   Platelets 257  150 - 400 K/uL   Neutrophils Relative % 57  43 - 77 %   Neutro Abs 5.4  1.7 - 7.7 K/uL   Lymphocytes Relative 32  12 - 46 %   Lymphs Abs 3.1  0.7 - 4.0 K/uL   Monocytes Relative 9  3 - 12 %   Monocytes Absolute 0.9  0.1 - 1.0 K/uL   Eosinophils Relative 2  0 - 5 %   Eosinophils Absolute 0.2  0.0 - 0.7 K/uL   Basophils Relative 0  0 - 1 %   Basophils Absolute 0.0  0.0 - 0.1 K/uL  TROPONIN I     Status: Abnormal   Collection Time    05/31/13  6:58 PM      Result Value Ref Range   Troponin I 0.33 (*) <0.30 ng/mL   Comment:            Due to the release kinetics of cTnI,     a negative result within the first hours     of the onset of symptoms does not rule out     myocardial infarction with certainty.     If myocardial infarction is still suspected,     repeat the test at appropriate intervals.     CRITICAL RESULT CALLED TO, READ BACK BY AND VERIFIED WITH:     JOHNSON,J ON 05/31/13 AT 1940 BY LOY,C    Radiology Reports: Dg Chest Portable 1 View  05/31/2013   CLINICAL DATA:  Chest pain for 2 days.  EXAM: PORTABLE CHEST - 1 VIEW  COMPARISON:  06/01/2011  FINDINGS: Normal heart, mediastinum hila. There is prominent cardiophrenic angle fat on the right, stable. Clear lungs. No pleural effusion or pneumothorax. The bony thorax is intact.  IMPRESSION: No active disease.   Electronically Signed   By: Lajean Manes M.D.   On: 05/31/2013 17:42    Electrocardiogram: 2 EKGs are available. The first one was done at 5:02 PM. Shows SVT at 166 beats per minute. Normal axis. ST depression noted in lateral leads and in V3 V4. No Q waves noted. Subsequent EKG about half a non-liters shows  sinus tachycardia at 107, with normal axis. No Q waves. QT interval noted to be normal. Nonspecific ST changes noted. ST depression, seen in previous EKG has resolved.  Problem List  Principal Problem:   Chest pain Active Problems:   SVT (supraventricular tachycardia)   HTN (hypertension), benign   Hypercholesteremia   COPD (chronic obstructive pulmonary disease)   Cervical stenosis of spine   Peripheral neuropathy   Assessment: This is a 53 year old, Caucasian female, who has hypertension, hypercholesterolemia, who is obese, was a former smoker comes in with the palpitations, and chest pain. She was noted to be in SVT, when she originally presented. Spontaneously resolved to sinus tachycardia. She has multiple risk factors for underlying coronary artery disease. She will require further evaluation. Initial troponin is mildly elevated.  Plan: #1 SVT: She'll be give Beta blocker. TSH will be checked. Echocardiogram will be ordered. Monitor on telemetry.  #2 chest pain with abnormal EKG: She has multiple risk factors for CAD. She'll be given aspirin, beta blocker. Troponin is mildly elevated, which could be from the SVT. However, we will go ahead and place her on intravenous heparin. Cardiology has been notified and they will consult on the patient when she arrives at [facility]. She will likely require cardiac testing including catheterization. Continue with statin as well.  #3 history of hypertension: Monitor blood pressure closely.  #4 history of COPD: She quit smoking in 2010. Albuterol as needed.  #5 history of peripheral neuropathy: Continue with Neurontin.  #6 history of hypercholesterolemia: Check lipid panel in the morning.   DVT Prophylaxis: She'll be on IV heparin Code Status: Full code Family Communication: Discussed with the patient  Disposition Plan: Transfer to [facility]   Further management decisions will depend on results of  further testing and patient's response to  treatment.  [facility]  [facility] Pager [phone]  If 7PM-7AM, please contact night-coverage [email or web address] Password [email or web address]  05/31/2013, 11:13 PM

## 2013-05-31 NOTE — ED Notes (Signed)
Pt took her own protonix 40 mg po

## 2013-05-31 NOTE — ED Notes (Signed)
Pt's HR has decreased to approximately 110 bmp. Pt appears less anxious and states "I feel a lot better now. My breathing is easier". Dr. Adriana Simasook has been notified. Dr. Adriana Simasook verbally ordered to hold medications and to continue monitoring pt's vitals.

## 2013-05-31 NOTE — ED Notes (Signed)
Pt becoming tearful when talking about probable admission. States she has anxiety disorder and could use something for it now. ERMD made aware

## 2013-05-31 NOTE — ED Notes (Addendum)
CRITICAL VALUE ALERT  Critical value received:  Troponin  Date of notification:  05/31/2013  Time of notification:  1940  Critical value read back: yes  Nurse who received alert:  Tiana LoftJanette Elecia Serafin, RN  MD notified:  Dr Adriana Simasook informed

## 2013-05-31 NOTE — ED Notes (Signed)
Pt reports sudden onset chest pain and SOB that began approximately 30 minutes pta. Pt states symptoms progressively worsened after onset. Pt denies any strenuous activity at time of onset. Upon arrival to ED pt's HR was greater than 180 bmp. Pt appeared anxious and diaphoretic. Shortly after pt was taken to room her HR decreased to approximately 110 bpm without intervention. Pt is now calm and relaxed in room. Pt states her "breathing is easier".

## 2013-05-31 NOTE — ED Notes (Signed)
Complain of chest pain and being SOB that started about 30 minutes ago. Pt hyperventilation on arrival to ED

## 2013-05-31 NOTE — ED Notes (Signed)
Attempted EKG twice at triage but machine malfunctioned

## 2013-06-01 ENCOUNTER — Encounter (HOSPITAL_COMMUNITY): Payer: Self-pay | Admitting: Emergency Medicine

## 2013-06-01 DIAGNOSIS — I10 Essential (primary) hypertension: Secondary | ICD-10-CM

## 2013-06-01 DIAGNOSIS — R079 Chest pain, unspecified: Secondary | ICD-10-CM

## 2013-06-01 DIAGNOSIS — I471 Supraventricular tachycardia: Secondary | ICD-10-CM

## 2013-06-01 DIAGNOSIS — I369 Nonrheumatic tricuspid valve disorder, unspecified: Secondary | ICD-10-CM

## 2013-06-01 DIAGNOSIS — G609 Hereditary and idiopathic neuropathy, unspecified: Secondary | ICD-10-CM

## 2013-06-01 DIAGNOSIS — E7800 Pure hypercholesterolemia, unspecified: Secondary | ICD-10-CM

## 2013-06-01 LAB — HEPARIN LEVEL (UNFRACTIONATED)
Heparin Unfractionated: 0.14 IU/mL — ABNORMAL LOW (ref 0.30–0.70)
Heparin Unfractionated: 0.34 IU/mL (ref 0.30–0.70)

## 2013-06-01 LAB — COMPREHENSIVE METABOLIC PANEL
ALT: 16 U/L (ref 0–35)
AST: 17 U/L (ref 0–37)
Albumin: 3.6 g/dL (ref 3.5–5.2)
Alkaline Phosphatase: 45 U/L (ref 39–117)
BUN: 12 mg/dL (ref 6–23)
CO2: 20 mEq/L (ref 19–32)
Calcium: 8.9 mg/dL (ref 8.4–10.5)
Chloride: 103 mEq/L (ref 96–112)
Creatinine, Ser: 0.67 mg/dL (ref 0.50–1.10)
GFR calc Af Amer: >90 mL/min (ref >90)
GFR calc non Af Amer: >90 mL/min (ref >90)
Glucose, Bld: 116 mg/dL — ABNORMAL HIGH (ref 70–99)
Potassium: 3.9 mEq/L (ref 3.7–5.3)
Sodium: 139 mEq/L (ref 137–147)
Total Bilirubin: 0.5 mg/dL (ref 0.3–1.2)
Total Protein: 6.9 g/dL (ref 6.0–8.3)

## 2013-06-01 LAB — CBC
HCT: 35.7 % — ABNORMAL LOW (ref 36.0–46.0)
Hemoglobin: 12.2 g/dL (ref 12.0–15.0)
MCH: 31.5 pg (ref 26.0–34.0)
MCHC: 34.2 g/dL (ref 30.0–36.0)
MCV: 92.2 fL (ref 78.0–100.0)
Platelets: 250 10*3/uL (ref 150–400)
RBC: 3.87 MIL/uL (ref 3.87–5.11)
RDW: 13.7 % (ref 11.5–15.5)
WBC: 9.4 10*3/uL (ref 4.0–10.5)

## 2013-06-01 LAB — TSH: TSH: 3.693 u[IU]/mL (ref 0.350–4.500)

## 2013-06-01 LAB — LIPID PANEL
Cholesterol: 204 mg/dL — ABNORMAL HIGH (ref 0–200)
HDL: 76 mg/dL (ref >39)
LDL Cholesterol: 116 mg/dL — ABNORMAL HIGH (ref 0–99)
Total CHOL/HDL Ratio: 2.7 RATIO
Triglycerides: 60 mg/dL (ref <150)
VLDL: 12 mg/dL (ref 0–40)

## 2013-06-01 LAB — HEMOGLOBIN A1C
Hgb A1c MFr Bld: 5.4 % (ref <5.7)
Mean Plasma Glucose: 108 mg/dL (ref <117)

## 2013-06-01 LAB — MRSA PCR SCREENING: MRSA by PCR: NEGATIVE

## 2013-06-01 LAB — PRO B NATRIURETIC PEPTIDE: Pro B Natriuretic peptide (BNP): 219.7 pg/mL — ABNORMAL HIGH (ref 0–125)

## 2013-06-01 LAB — TROPONIN I: Troponin I: <0.3 ng/mL (ref <0.30)

## 2013-06-01 MED ORDER — MORPHINE SULFATE 2 MG/ML IJ SOLN
1.0000 mg | INTRAMUSCULAR | Status: DC | PRN
  Administered 2013-06-01 (×2): 1 mg via INTRAVENOUS
  Filled 2013-06-01: qty 1

## 2013-06-01 MED ORDER — ATORVASTATIN CALCIUM 20 MG PO TABS
20.0000 mg | ORAL_TABLET | Freq: Every day | ORAL | Status: DC
  Filled 2013-06-01: qty 1

## 2013-06-01 MED ORDER — ONDANSETRON HCL 4 MG/2ML IJ SOLN
4.0000 mg | Freq: Four times a day (QID) | INTRAMUSCULAR | Status: DC | PRN
  Administered 2013-06-01: 4 mg via INTRAVENOUS

## 2013-06-01 MED ORDER — HEPARIN BOLUS VIA INFUSION: 4000.0000 [IU] | Freq: Once | INTRAVENOUS | Status: DC

## 2013-06-01 MED ORDER — HEPARIN BOLUS VIA INFUSION
2000.0000 [IU] | Freq: Once | INTRAVENOUS | Status: AC
  Administered 2013-06-01: 2000 [IU] via INTRAVENOUS
  Filled 2013-06-01: qty 2000

## 2013-06-01 MED ORDER — METOPROLOL TARTRATE 12.5 MG HALF TABLET
12.5000 mg | ORAL_TABLET | Freq: Two times a day (BID) | ORAL | Status: DC
  Administered 2013-06-01 – 2013-06-03 (×4): 12.5 mg via ORAL
  Filled 2013-06-01 (×6): qty 1

## 2013-06-01 MED ORDER — HYDROCODONE-ACETAMINOPHEN 5-325 MG PO TABS
1.0000 | ORAL_TABLET | ORAL | Status: DC | PRN
  Administered 2013-06-01 (×2): 1 via ORAL
  Administered 2013-06-02: 2 via ORAL
  Administered 2013-06-02 – 2013-06-03 (×3): 1 via ORAL
  Filled 2013-06-01 (×2): qty 1
  Filled 2013-06-01 (×2): qty 2
  Filled 2013-06-01 (×2): qty 1

## 2013-06-01 MED ORDER — METOPROLOL TARTRATE 25 MG PO TABS
25.0000 mg | ORAL_TABLET | Freq: Two times a day (BID) | ORAL | Status: DC
  Filled 2013-06-01: qty 1

## 2013-06-01 MED ORDER — ASPIRIN EC 81 MG PO TBEC
81.0000 mg | DELAYED_RELEASE_TABLET | Freq: Every day | ORAL | Status: DC
  Administered 2013-06-01 – 2013-06-03 (×3): 81 mg via ORAL
  Filled 2013-06-01 (×3): qty 1

## 2013-06-01 MED ORDER — HEPARIN (PORCINE) IN NACL 100-0.45 UNIT/ML-% IJ SOLN
1100.0000 [IU]/h | INTRAMUSCULAR | Status: DC
  Administered 2013-06-01: 1100 [IU]/h via INTRAVENOUS
  Administered 2013-06-01: 900 [IU]/h via INTRAVENOUS
  Administered 2013-06-01: 2000 [IU]/h via INTRAVENOUS
  Administered 2013-06-01 – 2013-06-02 (×2): 1100 [IU]/h via INTRAVENOUS
  Filled 2013-06-01 (×4): qty 250

## 2013-06-01 MED ORDER — PANTOPRAZOLE SODIUM 40 MG PO TBEC
40.0000 mg | DELAYED_RELEASE_TABLET | Freq: Two times a day (BID) | ORAL | Status: DC
  Administered 2013-06-01 – 2013-06-03 (×5): 40 mg via ORAL
  Filled 2013-06-01 (×5): qty 1

## 2013-06-01 MED ORDER — LOSARTAN POTASSIUM 50 MG PO TABS
50.0000 mg | ORAL_TABLET | Freq: Every day | ORAL | Status: DC
  Filled 2013-06-01: qty 1

## 2013-06-01 MED ORDER — GABAPENTIN 100 MG PO CAPS
100.0000 mg | ORAL_CAPSULE | Freq: Three times a day (TID) | ORAL | Status: DC
  Administered 2013-06-01 – 2013-06-03 (×7): 100 mg via ORAL
  Filled 2013-06-01 (×9): qty 1

## 2013-06-01 MED ORDER — HEPARIN (PORCINE) IN NACL 100-0.45 UNIT/ML-% IJ SOLN
INTRAMUSCULAR | Status: AC
  Filled 2013-06-01: qty 250

## 2013-06-01 MED ORDER — ONDANSETRON HCL 4 MG PO TABS: 4.0000 mg | ORAL_TABLET | Freq: Four times a day (QID) | ORAL | Status: DC | PRN

## 2013-06-01 MED ORDER — SODIUM CHLORIDE 0.9 % IV SOLN
INTRAVENOUS | Status: DC
  Administered 2013-06-01: 18:00:00 via INTRAVENOUS
  Administered 2013-06-02: 50 mL/h via INTRAVENOUS

## 2013-06-01 MED ORDER — ALUM & MAG HYDROXIDE-SIMETH 200-200-20 MG/5ML PO SUSP
30.0000 mL | ORAL | Status: DC | PRN
  Administered 2013-06-01: 30 mL via ORAL
  Filled 2013-06-01: qty 30

## 2013-06-01 MED ORDER — MORPHINE SULFATE 2 MG/ML IJ SOLN
INTRAMUSCULAR | Status: AC
  Administered 2013-06-01: 1 mg via INTRAVENOUS
  Filled 2013-06-01: qty 1

## 2013-06-01 MED ORDER — ACETAMINOPHEN 325 MG PO TABS: 650.0000 mg | ORAL_TABLET | Freq: Four times a day (QID) | ORAL | Status: DC | PRN

## 2013-06-01 MED ORDER — ATORVASTATIN CALCIUM 80 MG PO TABS
80.0000 mg | ORAL_TABLET | Freq: Every day | ORAL | Status: DC
  Administered 2013-06-01 – 2013-06-02 (×2): 80 mg via ORAL
  Filled 2013-06-01 (×3): qty 1

## 2013-06-01 MED ORDER — ALBUTEROL SULFATE (2.5 MG/3ML) 0.083% IN NEBU: 2.5000 mg | INHALATION_SOLUTION | RESPIRATORY_TRACT | Status: DC | PRN

## 2013-06-01 MED ORDER — ONDANSETRON HCL 4 MG/2ML IJ SOLN
INTRAMUSCULAR | Status: AC
  Filled 2013-06-01: qty 2

## 2013-06-01 MED ORDER — SODIUM CHLORIDE 0.9 % IJ SOLN: 3.0000 mL | Freq: Two times a day (BID) | INTRAMUSCULAR | Status: DC

## 2013-06-01 MED ORDER — ALPRAZOLAM 0.5 MG PO TABS
1.0000 mg | ORAL_TABLET | Freq: Three times a day (TID) | ORAL | Status: DC | PRN
  Administered 2013-06-01 – 2013-06-03 (×5): 1 mg via ORAL
  Filled 2013-06-01 (×6): qty 2

## 2013-06-01 MED ORDER — ACETAMINOPHEN 650 MG RE SUPP: 650.0000 mg | Freq: Four times a day (QID) | RECTAL | Status: DC | PRN

## 2013-06-01 NOTE — Progress Notes (Signed)
ANTICOAGULATION CONSULT NOTE - Initial Consult  Pharmacy Consult for Heparin Indication: ACS  No Known Allergies  Patient Measurements: Weight: 185 lb (83.915 kg)  Vital Signs: BP: 114/62 mmHg (02/28 0241) Pulse Rate: 80 (02/28 0241)  Labs:  Recent Labs  05/31/13 1858  HGB 12.9  HCT 37.8  PLT 257  CREATININE 0.85  TROPONINI 0.33*    The CrCl is unknown because both a height and weight (above a minimum accepted value) are required for this calculation.   Medical History: Past Medical History  Diagnosis Date  . COPD (chronic obstructive pulmonary disease)   . Hypertension    Assessment: 53yo female admitted with CP.  Asked to initiate IV heparin for ACS.   Goal of Therapy:  Heparin level 0.3-0.7 Monitor platelets by anticoagulation protocol: yes   Plan:  Heparin 4000 units bolus Heparin infusion at ~12 units/kg/hr hrparin level in 6-8 hrs then daily  Gina Bradley, Gina Bradley A 06/01/2013,2:48 AM

## 2013-06-01 NOTE — Consult Note (Signed)
Chief Complaint: CP and SVT Primary Cardiologist: none Reason for Consult:CP and SVT, mildly elevated troponin Referring Physician: [name] is an 53 y.o. female.  HPI: Gina Bradley is a 53 yo woman with prior tobacco use, quit 2010, COPD, hypertension, dyslipidemia, peripheral neuropathy and early family history of CAD ( mother age 28) and cervical stenosis who noted tachypalpitations earlier today intermittently revving up and down ultimately leading up to presentation with a prolonged episode that was caught on ECG at [facility]. She had some associated dizziness and chest pain - substernal, pressure-sensation as bad as 9/10 with some choking, nausea and diaphoresis and presyncope. She also endorses chest pain with exertion. No recent illnesses, no fever/chills. She's had tachypalpitations over the last several years that she previously attributed to anxiety. Given troponin of 0.33, the hospitalist at [facility] preferred her to be at [facility] and transferred her here to the hospitalist service for cardiac consultation. She received aspirin and heparin gtt and transferred here.    Past Medical History  Diagnosis Date  . COPD (chronic obstructive pulmonary disease)   . Hypertension     Past Surgical History  Procedure Laterality Date  . Cesarean section      Family History  Problem Relation Age of Onset  . Heart attack Mother   . Diabetes Mother   . Diabetes Father   . Suicidality Sister     Social History:  reports that she quit smoking about 5 years ago. She does not have any smokeless tobacco history on file. She reports that she drinks alcohol. She reports that she does not use illicit drugs.  Allergies: No Known Allergies  Medications: reviewed;  Current Facility-Administered Medications  Medication Dose Route Frequency Provider Last Rate Last Dose  . 0.9 %  sodium chloride infusion   Intravenous Continuous Bonnielee Haff, MD 50 mL/hr at 06/01/13 0400      . acetaminophen (TYLENOL) tablet 650 mg  650 mg Oral Q6H PRN Bonnielee Haff, MD       Or  . acetaminophen (TYLENOL) suppository 650 mg  650 mg Rectal Q6H PRN Bonnielee Haff, MD      . albuterol (PROVENTIL) (2.5 MG/3ML) 0.083% nebulizer solution 2.5 mg  2.5 mg Nebulization Q2H PRN Bonnielee Haff, MD      . ALPRAZolam Duanne Moron) tablet 1 mg  1 mg Oral TID PRN Bonnielee Haff, MD      . aspirin EC tablet 81 mg  81 mg Oral Daily Bonnielee Haff, MD      . atorvastatin (LIPITOR) tablet 20 mg  20 mg Oral Daily Bonnielee Haff, MD      . gabapentin (NEURONTIN) capsule 100 mg  100 mg Oral TID Bonnielee Haff, MD      . heparin ADULT infusion 100 units/mL (25000 units/250 mL)  900 Units/hr Intravenous Continuous Bonnielee Haff, MD 9 mL/hr at 06/01/13 0258 900 Units/hr at 06/01/13 0258  . heparin bolus via infusion 4,000 Units  4,000 Units Intravenous Once Bonnielee Haff, MD 9 mL/hr at 06/01/13 0400 900 Units at 06/01/13 0400  . HYDROcodone-acetaminophen (NORCO/VICODIN) 5-325 MG per tablet 1-2 tablet  1-2 tablet Oral Q4H PRN Bonnielee Haff, MD      . losartan (COZAAR) tablet 50 mg  50 mg Oral Daily Bonnielee Haff, MD      . metoprolol tartrate (LOPRESSOR) tablet 25 mg  25 mg Oral BID Bonnielee Haff, MD      . morphine 2 MG/ML injection 1 mg  1 mg Intravenous  Q3H PRN Bonnielee Haff, MD   1 mg at 06/01/13 0246  . ondansetron (ZOFRAN) tablet 4 mg  4 mg Oral Q6H PRN Bonnielee Haff, MD       Or  . ondansetron (ZOFRAN) injection 4 mg  4 mg Intravenous Q6H PRN Bonnielee Haff, MD   4 mg at 06/01/13 0247  . pantoprazole (PROTONIX) EC tablet 40 mg  40 mg Oral BID Bonnielee Haff, MD      . sodium chloride 0.9 % injection 3 mL  3 mL Intravenous Q12H Bonnielee Haff, MD         Results for orders placed during the hospital encounter of 05/31/13 (from the past 48 hour(s))  BASIC METABOLIC PANEL     Status: Abnormal   Collection Time    05/31/13  6:58 PM      Result Value Ref Range   Sodium 142  137 - 147 mEq/L    Potassium 3.8  3.7 - 5.3 mEq/L   Chloride 103  96 - 112 mEq/L   CO2 28  19 - 32 mEq/L   Glucose, Bld 76  70 - 99 mg/dL   BUN 16  6 - 23 mg/dL   Creatinine, Ser 0.85  0.50 - 1.10 mg/dL   Calcium 9.5  8.4 - 10.5 mg/dL   GFR calc non Af Amer 77 (*) >90 mL/min   GFR calc Af Amer 90 (*) >90 mL/min   Comment: (NOTE)     The eGFR has been calculated using the CKD EPI equation.     This calculation has not been validated in all clinical situations.     eGFR's persistently <90 mL/min signify possible Chronic Kidney     Disease.  CBC WITH DIFFERENTIAL     Status: None   Collection Time    05/31/13  6:58 PM      Result Value Ref Range   WBC 9.5  4.0 - 10.5 K/uL   RBC 4.09  3.87 - 5.11 MIL/uL   Hemoglobin 12.9  12.0 - 15.0 g/dL   HCT 37.8  36.0 - 46.0 %   MCV 92.4  78.0 - 100.0 fL   MCH 31.5  26.0 - 34.0 pg   MCHC 34.1  30.0 - 36.0 g/dL   RDW 13.5  11.5 - 15.5 %   Platelets 257  150 - 400 K/uL   Neutrophils Relative % 57  43 - 77 %   Neutro Abs 5.4  1.7 - 7.7 K/uL   Lymphocytes Relative 32  12 - 46 %   Lymphs Abs 3.1  0.7 - 4.0 K/uL   Monocytes Relative 9  3 - 12 %   Monocytes Absolute 0.9  0.1 - 1.0 K/uL   Eosinophils Relative 2  0 - 5 %   Eosinophils Absolute 0.2  0.0 - 0.7 K/uL   Basophils Relative 0  0 - 1 %   Basophils Absolute 0.0  0.0 - 0.1 K/uL  TROPONIN I     Status: Abnormal   Collection Time    05/31/13  6:58 PM      Result Value Ref Range   Troponin I 0.33 (*) <0.30 ng/mL   Comment:            Due to the release kinetics of cTnI,     a negative result within the first hours     of the onset of symptoms does not rule out     myocardial infarction with certainty.     If  myocardial infarction is still suspected,     repeat the test at appropriate intervals.     CRITICAL RESULT CALLED TO, READ BACK BY AND VERIFIED WITH:     JOHNSON,J ON 05/31/13 AT 1940 BY LOY,C  TROPONIN I     Status: None   Collection Time    06/01/13  2:36 AM      Result Value Ref Range   Troponin  I <0.30  <0.30 ng/mL   Comment:            Due to the release kinetics of cTnI,     a negative result within the first hours     of the onset of symptoms does not rule out     myocardial infarction with certainty.     If myocardial infarction is still suspected,     repeat the test at appropriate intervals.    Dg Chest Portable 1 View  05/31/2013   CLINICAL DATA:  Chest pain for 2 days.  EXAM: PORTABLE CHEST - 1 VIEW  COMPARISON:  06/01/2011  FINDINGS: Normal heart, mediastinum hila. There is prominent cardiophrenic angle fat on the right, stable. Clear lungs. No pleural effusion or pneumothorax. The bony thorax is intact.  IMPRESSION: No active disease.   Electronically Signed   By: Lajean Manes M.D.   On: 05/31/2013 17:42    Review of Systems  Constitutional: Positive for diaphoresis. Negative for fever and chills.  HENT: Negative for ear pain and tinnitus.   Eyes: Negative for double vision and pain.  Respiratory: Positive for shortness of breath. Negative for cough and hemoptysis.   Cardiovascular: Positive for chest pain, palpitations and leg swelling.  Gastrointestinal: Negative for nausea and vomiting.  Genitourinary: Negative for dysuria and urgency.  Musculoskeletal: Negative for falls and myalgias.  Neurological: Positive for dizziness. Negative for tingling, tremors and headaches.  Endo/Heme/Allergies: Negative for polydipsia. Does not bruise/bleed easily.  Psychiatric/Behavioral: Negative for depression, suicidal ideas and substance abuse.   Blood pressure 164/78, pulse 74, temperature 98.8 F (37.1 C), temperature source Oral, resp. rate 20, height 5' 1"  (1.549 m), weight 88.9 kg (195 lb 15.8 oz), last menstrual period 05/05/2013, SpO2 98.00%. Physical Exam  Nursing note and vitals reviewed. Constitutional: She is oriented to person, place, and time. She appears well-developed and well-nourished. No distress.  HENT:  Head: Normocephalic and atraumatic.  Nose: Nose  normal.  Mouth/Throat: Oropharynx is clear and moist. No oropharyngeal exudate.  Eyes: Conjunctivae and EOM are normal. Pupils are equal, round, and reactive to light. No scleral icterus.  Neck: Normal range of motion. Neck supple. No JVD present. No thyromegaly present.  Cardiovascular: Normal rate, regular rhythm, normal heart sounds and intact distal pulses.  Exam reveals no gallop.   No murmur heard. Respiratory: Effort normal and breath sounds normal. No respiratory distress. She has no wheezes.  GI: Soft. Bowel sounds are normal. She exhibits no distension. There is no tenderness. There is no rebound.  Musculoskeletal: Normal range of motion. She exhibits edema. She exhibits no tenderness.  Trace LEE bilaterally  Neurological: She is alert and oriented to person, place, and time. No cranial nerve deficit.  Skin: Skin is warm and dry. No rash noted. She is not diaphoretic. No erythema.  Psychiatric: She has a normal mood and affect. Her behavior is normal.   Labs reviewed; wbc 9.5, h/h 12.9/37.5, plt 257, na 142, K 3.8, bun/cr 16/0.85, glucose 76, troponin 0.33 --> <0.3 Chest x-ray: unrevealing ECG: SVT 160s Repeat ECG:  HR 107, sinus tachycardia 3rd ECG: HR 67, NSR, nl axis  Problem List SVT Chest Pain, isolated elevated troponin Hypertension Chart mention of COPD - unknown PFTs Dyslipidemia Premature cAD in mother Peripheral Neuropathy  Assessment/Plan: Gina Bradley is a 53 yo woman with PMH of SVT, hypertension, dyslipidemia, family history of early CAD, peripheral neuropathy and prior tobacco use who presents with SVT and found to have isolated elevated troponin of 0.33. Differential diagnosis for SVT is AVNRT, AVRT, atrial tachycardia. CP with exertion could be related to rhythm, atypical or anginal equivalent. At the very least Gina Bradley needs a stress imaging test; however, I'd favor cardiac catheterization. She's on heparin gtt, aspirin, metoprolol and atorvastatin. She may  also benefit from SVT ablation. Will continue to observe in stepdown, trend cardiac markers and re-evaluate in AM. I discussed my thoughts with her regarding stress testing vs. Cardiac catheterization and potential need for cardiac ablation after ischemic evaluation completed.  - trend cardiac markers, telemetry - aspirin 81 mg daily, heparin gtt, metoprolol low dose q12 - continue atorvastatin - add on TSH, BNP, lipid panel, hba1c  Tally Mckinnon 06/01/2013, 4:42 AM

## 2013-06-01 NOTE — Progress Notes (Signed)
ANTICOAGULATION CONSULT NOTE - Follow Up Consult  Pharmacy Consult for heparin Indication: chest pain/ACS  No Known Allergies  Patient Measurements: Height: 5\' 1"  (154.9 cm) Weight: 195 lb 15.8 oz (88.9 kg) IBW/kg (Calculated) : 47.8 Heparin Dosing Weight: 68.5kg  Vital Signs: Temp: 98.6 F (37 C) (02/28 1226) Temp src: Oral (02/28 1226) BP: 97/40 mmHg (02/28 1500) Pulse Rate: 73 (02/28 1500)  Labs:  Recent Labs  05/31/13 1858 06/01/13 0236 06/01/13 0438 06/01/13 1522  HGB 12.9  --  12.2  --   HCT 37.8  --  35.7*  --   PLT 257  --  250  --   HEPARINUNFRC  --   --   --  0.14*  CREATININE 0.85  --  0.67  --   TROPONINI 0.33* <0.30  --   --     Estimated Creatinine Clearance: 83.4 ml/min (by C-G formula based on Cr of 0.67).   Medications:  Infusions:  . sodium chloride 50 mL/hr at 06/01/13 1500  . heparin 900 Units/hr (06/01/13 1500)    Assessment: 52 yof presented to the hospital with CP and started on IV heparin. Initial heparin level is subtherapeutic at 0.14. No bleeding noted and no problems with the line per RN. Planning cath for Monday.   Goal of Therapy:  Heparin level 0.3-0.7 units/ml Monitor platelets by anticoagulation protocol: Yes   Plan:  1. Heparin bolus 2000 units IV x 1 2. Increase heparin gtt to 1100 units/hr 3. Check a 6 hour heparin level  Argel Pablo, Drake Leachachel Lynn 06/01/2013,4:30 PM

## 2013-06-01 NOTE — Progress Notes (Signed)
  Echocardiogram 2D Echocardiogram has been performed.  Gina Bradley, Gina Bradley 06/01/2013, 11:33 AM

## 2013-06-01 NOTE — Progress Notes (Signed)
[facility]                                                                              Patient Demographics  Gina Bradley, is a 53 y.o. female, DOB - 1960-08-27, ZOX:[id number]  Admit date - 05/31/2013   Admitting Physician Osvaldo Shipper, MD  Outpatient Primary MD for the patient is [name], MD  LOS - 1   Chief Complaint  Patient presents with  . Chest Pain        Assessment & Plan  SVT -Cardiology consulted, pending further recommendations, possible ablation -TSH pending -echocardiogram pending -Continue metoprolol 12.5mg  BID -Trop 0.33 --> 0.30  Chest Pain with abdnormal EKG -She has multiple risk factors for CAD, including family history, HTN, Hyperlipidemia, smoking -Cardiology consulted, possible stress test vs cardiac cath -Troponin was 0.33, however, subsequent trop < 0.30 (likely elevated secondary to SVT) -Continue aspirin, metoprolol, heparin drip    Hypertension -Losartan held, as patient is currently hypotensive -Continue metoprolol -Will continue to monitor  COPD -She quit smoking in 2010.  -Albuterol as needed.   Peripheral neuropathy -Continue with Neurontin.   Hypercholesterolemia -Continue atorvastatin -Lipid profile: 204/60/76/116  Anxiety -Continue xanax PRN  GERD -Continue PPI  Code Status: Full  Family Communication: None at bedised  Disposition Plan: Admitted  Time Spent in minutes   35 minutes  Procedures None  Consults  Cardiology  DVT Prophylaxis  Heparin drip  Lab Results  Component Value Date   PLT 250 06/01/2013    Medications  Scheduled Meds: . aspirin EC  81 mg Oral Daily  . atorvastatin  80 mg Oral Daily  . gabapentin  100 mg Oral TID  . heparin  4,000 Units Intravenous Once  . losartan  50 mg Oral Daily  . metoprolol tartrate  25 mg Oral BID  . pantoprazole  40 mg Oral BID  . sodium chloride  3 mL Intravenous Q12H   Continuous Infusions: . sodium chloride 50 mL/hr at 06/01/13  0400  . heparin 900 Units/hr (06/01/13 0258)   PRN Meds:.acetaminophen, acetaminophen, albuterol, ALPRAZolam, HYDROcodone-acetaminophen, morphine injection, ondansetron (ZOFRAN) IV, ondansetron  Antibiotics    Anti-infectives   None        Subjective:   Gina Bradley seen and examined today.  Patient is no longer having chest pain.  She is worried as she has a strong family history of heart disease.  She does feel a burning sensation midsternally.  She states she does not feel her heart pounding or racing.  Objective:   Filed Vitals:   06/01/13 0301 06/01/13 0400 06/01/13 0500 06/01/13 0600  BP: 164/78 108/55 107/60 80/44  Pulse: 74 68 71 67  Temp:  98.8 F (37.1 C)    TempSrc:  Oral    Resp: 20 11 15 16   Height:  5\' 1"  (1.549 m)    Weight:  88.9 kg (195 lb 15.8 oz)    SpO2: 98% 98%      Wt Readings from Last 3 Encounters:  06/01/13 88.9 kg (195 lb 15.8 oz)  06/01/11 81.647 kg (180 lb)     Intake/Output Summary (Last 24 hours) at 06/01/13 0734 Last data filed at 06/01/13 0600  Gross per 24 hour  Intake  180.8 ml  Output    350 ml  Net -169.2 ml    Exam  General: Well developed, well nourished, NAD, appears stated age  HEENT: NCAT, PERRLA, EOMI, Anicteic Sclera, mucous membranes moist.   Neck: Supple, no JVD, no masses  Cardiovascular: S1 S2 auscultated, no rubs, murmurs or gallops. Regular rate and rhythm.  Respiratory: Clear to auscultation bilaterally with equal chest rise  Abdomen: Soft, obese, nontender, nondistended, + bowel sounds  Extremities: warm dry without cyanosis clubbing. Trace edema  Neuro: AAOx3, cranial nerves grossly intact. Strength 5/5 in patient's upper and lower extremities bilaterally  Skin: Without rashes exudates or nodules  Psych: Normal affect and demeanor with intact judgement and insight, somewhat anxious  Data Review   Micro Results Recent Results (from the past 240 hour(s))  MRSA PCR SCREENING     Status: None    Collection Time    06/01/13  3:54 AM      Result Value Ref Range Status   MRSA by PCR NEGATIVE  NEGATIVE Final   Comment:            The GeneXpert MRSA Assay (FDA     approved for NASAL specimens     only), is one component of a     comprehensive MRSA colonization     surveillance program. It is not     intended to diagnose MRSA     infection nor to guide or     monitor treatment for     MRSA infections.    Radiology Reports Dg Chest Portable 1 View  05/31/2013   CLINICAL DATA:  Chest pain for 2 days.  EXAM: PORTABLE CHEST - 1 VIEW  COMPARISON:  06/01/2011  FINDINGS: Normal heart, mediastinum hila. There is prominent cardiophrenic angle fat on the right, stable. Clear lungs. No pleural effusion or pneumothorax. The bony thorax is intact.  IMPRESSION: No active disease.   Electronically Signed   By: Amie Portland M.D.   On: 05/31/2013 17:42    CBC  Recent Labs Lab 05/31/13 1858 06/01/13 0438  WBC 9.5 9.4  HGB 12.9 12.2  HCT 37.8 35.7*  PLT 257 250  MCV 92.4 92.2  MCH 31.5 31.5  MCHC 34.1 34.2  RDW 13.5 13.7  LYMPHSABS 3.1  --   MONOABS 0.9  --   EOSABS 0.2  --   BASOSABS 0.0  --     Chemistries   Recent Labs Lab 05/31/13 1858 06/01/13 0438  NA 142 139  K 3.8 3.9  CL 103 103  CO2 28 20  GLUCOSE 76 116*  BUN 16 12  CREATININE 0.85 0.67  CALCIUM 9.5 8.9  AST  --  17  ALT  --  16  ALKPHOS  --  45  BILITOT  --  0.5   ------------------------------------------------------------------------------------------------------------------ estimated creatinine clearance is 83.4 ml/min (by C-G formula based on Cr of 0.67). ------------------------------------------------------------------------------------------------------------------ No results found for this basename: HGBA1C,  in the last 72 hours ------------------------------------------------------------------------------------------------------------------  Recent Labs  06/01/13 0438  CHOL 204*  HDL 76    LDLCALC 116*  TRIG 60  CHOLHDL 2.7   ------------------------------------------------------------------------------------------------------------------ No results found for this basename: TSH, T4TOTAL, FREET3, T3FREE, THYROIDAB,  in the last 72 hours ------------------------------------------------------------------------------------------------------------------ No results found for this basename: VITAMINB12, FOLATE, FERRITIN, TIBC, IRON, RETICCTPCT,  in the last 72 hours  Coagulation profile No results found for this basename: INR, PROTIME,  in the last 168 hours  No  results found for this basename: DDIMER,  in the last 72 hours  Cardiac Enzymes  Recent Labs Lab 05/31/13 1858 06/01/13 0236  TROPONINI 0.33* <0.30   ------------------------------------------------------------------------------------------------------------------ No components found with this basename: POCBNP,     Revanth Neidig D.O. on 06/01/2013 at 7:34 AM  Between 7am to 7pm - Pager - [phone]  After 7pm go to [email or web address] - password [email or web address]  And look for the night coverage person covering for me after hours  [facility] Office  [phone]

## 2013-06-01 NOTE — ED Notes (Signed)
Patient called me in room stating that she feels nauseated and dizzy. States that the burning sensation is coming back. Vitals are within normal.

## 2013-06-01 NOTE — Progress Notes (Signed)
Patient ID: Gina Bradley, female   DOB: Feb 16, 1961, 53 y.o.   MRN: [id number]  Patient seen by fellow earlier this morning.  She had SVT (looks like AVNRT with pseudo-r' on ECG).  She has been in NSR here.  BP too low for metoprolol, getting IV fluid.  1 elevated troponin + CP with SVT.  She also reports chest pressure with exertion prior to this at home for at least a year.  Strong family history of premature CAD.  She will need cardiac cath, plan for Monday.  Would also consider AVNRT ablation (will be difficult with low BP to get her on a good dose of beta blocker).   Gina Bradley 06/01/2013 12:07 PM

## 2013-06-02 DIAGNOSIS — I200 Unstable angina: Secondary | ICD-10-CM

## 2013-06-02 LAB — CBC
HCT: 35.6 % — ABNORMAL LOW (ref 36.0–46.0)
Hemoglobin: 11.8 g/dL — ABNORMAL LOW (ref 12.0–15.0)
MCH: 31 pg (ref 26.0–34.0)
MCHC: 33.1 g/dL (ref 30.0–36.0)
MCV: 93.4 fL (ref 78.0–100.0)
Platelets: 225 10*3/uL (ref 150–400)
RBC: 3.81 MIL/uL — ABNORMAL LOW (ref 3.87–5.11)
RDW: 13.6 % (ref 11.5–15.5)
WBC: 9.1 10*3/uL (ref 4.0–10.5)

## 2013-06-02 LAB — BASIC METABOLIC PANEL
BUN: 12 mg/dL (ref 6–23)
CO2: 24 mEq/L (ref 19–32)
Calcium: 8.7 mg/dL (ref 8.4–10.5)
Chloride: 107 mEq/L (ref 96–112)
Creatinine, Ser: 0.82 mg/dL (ref 0.50–1.10)
GFR calc Af Amer: >90 mL/min (ref >90)
GFR calc non Af Amer: 81 mL/min — ABNORMAL LOW (ref >90)
Glucose, Bld: 109 mg/dL — ABNORMAL HIGH (ref 70–99)
Potassium: 4.1 mEq/L (ref 3.7–5.3)
Sodium: 144 mEq/L (ref 137–147)

## 2013-06-02 LAB — HEPARIN LEVEL (UNFRACTIONATED): Heparin Unfractionated: 0.38 IU/mL (ref 0.30–0.70)

## 2013-06-02 MED ORDER — ASPIRIN 81 MG PO CHEW
81.0000 mg | CHEWABLE_TABLET | ORAL | Status: AC
  Administered 2013-06-03: 81 mg via ORAL
  Filled 2013-06-02: qty 1

## 2013-06-02 MED ORDER — SODIUM CHLORIDE 0.9 % IJ SOLN
3.0000 mL | Freq: Two times a day (BID) | INTRAMUSCULAR | Status: DC
  Administered 2013-06-02: 3 mL via INTRAVENOUS

## 2013-06-02 MED ORDER — SODIUM CHLORIDE 0.9 % IV SOLN
1.0000 mL/kg/h | INTRAVENOUS | Status: DC
  Administered 2013-06-03: 1 mL/kg/h via INTRAVENOUS

## 2013-06-02 MED ORDER — SODIUM CHLORIDE 0.9 % IJ SOLN: 3.0000 mL | INTRAMUSCULAR | Status: DC | PRN

## 2013-06-02 MED ORDER — SODIUM CHLORIDE 0.9 % IV SOLN: 250.0000 mL | INTRAVENOUS | Status: DC | PRN

## 2013-06-02 NOTE — Progress Notes (Signed)
Patient ID: Gina Bradley, female   DOB: [birth year], 53 y.o.   MRN: [id number]   SUBJECTIVE: She had some chest pain yesterday afternoon, no further pain today.  Feels better.  No further SVT.  SBP continues to run on the low side, in the 90s-100s range.   Scheduled Meds: . aspirin EC  81 mg Oral Daily  . atorvastatin  80 mg Oral Daily  . gabapentin  100 mg Oral TID  . metoprolol tartrate  12.5 mg Oral BID  . pantoprazole  40 mg Oral BID  . sodium chloride  3 mL Intravenous Q12H   Continuous Infusions: . sodium chloride 50 mL/hr at 06/01/13 1900  . heparin 1,100 Units/hr (06/01/13 1900)   PRN Meds:.acetaminophen, acetaminophen, albuterol, ALPRAZolam, alum & mag hydroxide-simeth, HYDROcodone-acetaminophen, morphine injection, ondansetron (ZOFRAN) IV, ondansetron    Filed Vitals:   06/02/13 0000 06/02/13 0400 06/02/13 0803 06/02/13 1215  BP: 104/60 118/91 93/58 119/61  Pulse: 70 66    Temp:  97.3 F (36.3 C) 98.6 F (37 C) 98.5 F (36.9 C)  TempSrc:  Oral Oral Oral  Resp: 16  16   Height:      Weight:  89 kg (196 lb 3.4 oz)    SpO2:  100% 100% 100%    Intake/Output Summary (Last 24 hours) at 06/02/13 1222 Last data filed at 06/02/13 0800  Gross per 24 hour  Intake   1883 ml  Output   2450 ml  Net   -567 ml    LABS: Basic Metabolic Panel:  Recent Labs  78/29/56 0438 06/02/13 0330  NA 139 144  K 3.9 4.1  CL 103 107  CO2 20 24  GLUCOSE 116* 109*  BUN 12 12  CREATININE 0.67 0.82  CALCIUM 8.9 8.7   Liver Function Tests:  Recent Labs  06/01/13 0438  AST 17  ALT 16  ALKPHOS 45  BILITOT 0.5  PROT 6.9  ALBUMIN 3.6   No results found for this basename: LIPASE, AMYLASE,  in the last 72 hours CBC:  Recent Labs  05/31/13 1858 06/01/13 0438 06/02/13 0330  WBC 9.5 9.4 9.1  NEUTROABS 5.4  --   --   HGB 12.9 12.2 11.8*  HCT 37.8 35.7* 35.6*  MCV 92.4 92.2 93.4  PLT 257 250 225   Cardiac Enzymes:  Recent Labs  05/31/13 1858 06/01/13 0236    TROPONINI 0.33* <0.30   BNP: No components found with this basename: POCBNP,  D-Dimer: No results found for this basename: DDIMER,  in the last 72 hours Hemoglobin A1C:  Recent Labs  06/01/13 0438  HGBA1C 5.4   Fasting Lipid Panel:  Recent Labs  06/01/13 0438  CHOL 204*  HDL 76  LDLCALC 116*  TRIG 60  CHOLHDL 2.7   Thyroid Function Tests:  Recent Labs  06/01/13 0438  TSH 3.693   Anemia Panel: No results found for this basename: VITAMINB12, FOLATE, FERRITIN, TIBC, IRON, RETICCTPCT,  in the last 72 hours  RADIOLOGY: Dg Chest Portable 1 View  05/31/2013   CLINICAL DATA:  Chest pain for 2 days.  EXAM: PORTABLE CHEST - 1 VIEW  COMPARISON:  06/01/2011  FINDINGS: Normal heart, mediastinum hila. There is prominent cardiophrenic angle fat on the right, stable. Clear lungs. No pleural effusion or pneumothorax. The bony thorax is intact.  IMPRESSION: No active disease.   Electronically Signed   By: Amie Portland M.D.   On: 05/31/2013 17:42    PHYSICAL EXAM General: NAD Neck: No JVD,  no thyromegaly or thyroid nodule.  Lungs: Clear to auscultation bilaterally with normal respiratory effort. CV: Nondisplaced PMI.  Heart regular S1/S2, no S3/S4, no murmur.  No peripheral edema.  No carotid bruit.  Normal pedal pulses.  Abdomen: Soft, nontender, no hepatosplenomegaly, no distention.  Neurologic: Alert and oriented x 3.  Psych: Normal affect. Extremities: No clubbing or cyanosis.   TELEMETRY: Reviewed telemetry pt in NSR  ASSESSMENT AND PLAN: 53 yo presented with SVT and chest pain. The SVT looked like AVNRT with pseudo-r' on ECG. She has been in NSR here.  1 elevated troponin + CP with SVT. She also reports chest pressure with exertion prior to this at home for at least a year.  1. SVT: Suspect AVNRT with pseudo-r' on 12-lead ECG with SVT. BP runs on the low side and limits uptitration of metoprolol (now on 12.5 mg bid).  She has had periodic runs of symptoms consistent with  SVT, Friday's was the most prolonged and symptomatic.  Would have EP evaluate her, would consider AVNRT ablation given severe symptoms and difficulty with uptitration of beta blocker.  2. CAD: Patient has severe chest tightness with SVT and 1 elevated troponin (others normal).  This could be demand ischemia, but she has been having exertional chest tightness for up to a year now. She has a strong family history of premature CAD.  I think that she will need left heart catheterization.  Will arrange for this to be done tomorrow.  Continue ASA, statin, heparin gtt for now.   Marca AnconaDalton Ifeanyichukwu Wickham 06/02/2013 12:27 PM

## 2013-06-02 NOTE — Progress Notes (Signed)
ANTICOAGULATION CONSULT NOTE - Follow Up Consult  Pharmacy Consult for heparin Indication: chest pain/ACS  Labs:  Recent Labs  05/31/13 1858 06/01/13 0236 06/01/13 0438 06/01/13 1522 06/01/13 2235  HGB 12.9  --  12.2  --   --   HCT 37.8  --  35.7*  --   --   PLT 257  --  250  --   --   HEPARINUNFRC  --   --   --  0.14* 0.34  CREATININE 0.85  --  0.67  --   --   TROPONINI 0.33* <0.30  --   --   --     Assessment/Plan:  53yo female now therapeutic on heparin after rate increase.  Will continue gtt at current rate and confirm stable with am labs.  Vernard GamblesVeronda Brodee Mauritz, PharmD, BCPS  06/02/2013,12:34 AM

## 2013-06-02 NOTE — Progress Notes (Signed)
ANTICOAGULATION CONSULT NOTE - Follow Up Consult  Pharmacy Consult for heparin Indication: chest pain/ACS  No Known Allergies  Patient Measurements: Height: 5\' 1"  (154.9 cm) Weight: 196 lb 3.4 oz (89 kg) IBW/kg (Calculated) : 47.8 Heparin Dosing Weight: 68.5kg  Vital Signs: Temp: 98.6 F (37 C) (03/01 0803) Temp src: Oral (03/01 0803) BP: 93/58 mmHg (03/01 0803) Pulse Rate: 66 (03/01 0400)  Labs:  Recent Labs  05/31/13 1858 06/01/13 0236 06/01/13 0438 06/01/13 1522 06/01/13 2235 06/02/13 0330  HGB 12.9  --  12.2  --   --  11.8*  HCT 37.8  --  35.7*  --   --  35.6*  PLT 257  --  250  --   --  225  HEPARINUNFRC  --   --   --  0.14* 0.34 0.38  CREATININE 0.85  --  0.67  --   --  0.82  TROPONINI 0.33* <0.30  --   --   --   --     Estimated Creatinine Clearance: 81.5 ml/min (by C-G formula based on Cr of 0.82).   Medications:  Infusions:  . sodium chloride 50 mL/hr at 06/01/13 1900  . heparin 1,100 Units/hr (06/01/13 1900)    Assessment: 52 yof on heparin for ACS and plans for cath on 3/2. Patients HL is therapeutic and CBC stable. No signs of bleed noted.  Goal of Therapy:  Heparin level 0.3-0.7 units/ml Monitor platelets by anticoagulation protocol: Yes   Plan:  1. Continue heparin gtt to 1100 units/hr 2. Check HL and CBC daily 3. Monitor for signs of bleed  Dolphus Jennyatel, Adarius Tigges M 06/02/2013,11:01 AM

## 2013-06-02 NOTE — Progress Notes (Signed)
[facility]                                                                              Patient Demographics  Gina Bradley, is a 53 y.o. female, DOB - Nov 26, 1960, ZOX:[id number]  Admit date - 05/31/2013   Admitting Physician Osvaldo Shipper, MD  Outpatient Primary MD for the patient is [name], MD  LOS - 2   Chief Complaint  Patient presents with  . Chest Pain        Assessment & Plan  SVT -Cardiology consulted, pending further recommendations, possible ablation -TSH 3.693 -2D echo: EF 65-70%, Grade 1 diastolic dysfunction -Continue metoprolol 12.5mg  BID -Trop 0.33 --> 0.30  Chest Pain with abdnormal EKG -She has multiple risk factors for CAD, including family history, HTN, Hyperlipidemia, smoking -Cardiology consulted, Cardiac cath on 06/03/13 -Troponin was 0.33, however, subsequent trop < 0.30 (likely elevated secondary to SVT) -Continue aspirin, metoprolol, heparin drip    Hypertension -Losartan held, as patient is currently hypotensive -Continue metoprolol -Will continue to monitor  COPD -She quit smoking in 2010.  -Albuterol as needed.   Peripheral neuropathy -Continue with Neurontin.   Hypercholesterolemia -Continue atorvastatin -Lipid profile: 204/60/76/116  Anxiety -Continue xanax PRN  GERD -Continue PPI  Code Status: Full  Family Communication: None at bedised  Disposition Plan: Admitted, Cardiac cath on 06/03/13  Time Spent in minutes   25 minutes  Procedures  Echocardiogram 06/01/13 Study Conclusions - Left ventricle: The cavity size was normal. Wall thickness was normal. Systolic function was vigorous. The estimated ejection fraction was in the range of 65% to 70%. Wall motion was normal; there were no regional wall motion abnormalities. Doppler parameters are consistent with abnormal left ventricular relaxation (grade 1 diastolic dysfunction). - Right atrium: Central venous pressure: 8mm Hg (est). - Tricuspid valve: Mild  regurgitation. - Pulmonary arteries: PA peak pressure: 24mm Hg (S). - Pericardium, extracardiac: A prominent pericardial fat pad was present. Impressions: - Normal LV wall thickness with LVEF 65-70%, grade 1 diastolic dysfunction. Mild tricuspid regurgitation with PASP 24 mmHg. Prominent epicardial fat pad  Consults  Cardiology  DVT Prophylaxis  Heparin drip  Lab Results  Component Value Date   PLT 225 06/02/2013    Medications  Scheduled Meds: . aspirin EC  81 mg Oral Daily  . atorvastatin  80 mg Oral Daily  . gabapentin  100 mg Oral TID  . metoprolol tartrate  12.5 mg Oral BID  . pantoprazole  40 mg Oral BID  . sodium chloride  3 mL Intravenous Q12H   Continuous Infusions: . sodium chloride 50 mL/hr at 06/01/13 1900  . heparin 1,100 Units/hr (06/01/13 1900)   PRN Meds:.acetaminophen, acetaminophen, albuterol, ALPRAZolam, alum & mag hydroxide-simeth, HYDROcodone-acetaminophen, morphine injection, ondansetron (ZOFRAN) IV, ondansetron  Antibiotics    Anti-infectives   None        Subjective:   Gina Bradley seen and examined today.  Patient no longer feels any pain, or pounding in her chest.  She has no complaints of SOB.    Objective:   Filed Vitals:   06/01/13 2300 06/01/13 2350 06/02/13 0000 06/02/13 0400  BP: 116/68  104/60 118/91  Pulse: 77  70 66  Temp:  98.7 F (37.1 C)  97.3 F (36.3 C)  TempSrc:  Oral  Oral  Resp: 15  16   Height:      Weight:    89 kg (196 lb 3.4 oz)  SpO2:    100%    Wt Readings from Last 3 Encounters:  06/02/13 89 kg (196 lb 3.4 oz)  06/01/11 81.647 kg (180 lb)     Intake/Output Summary (Last 24 hours) at 06/02/13 0729 Last data filed at 06/02/13 0600  Gross per 24 hour  Intake   2616 ml  Output   2800 ml  Net   -184 ml    Exam  General: Well developed, well nourished, NAD, appears stated age  HEENT: NCAT, mucous membranes moist.   Neck: Supple, no JVD, no masses  Cardiovascular: S1 S2 auscultated, no rubs,  murmurs or gallops. Regular rate and rhythm.  Respiratory: Clear to auscultation bilaterally with equal chest rise  Abdomen: Soft, obese, nontender, nondistended, + bowel sounds  Extremities: warm dry without cyanosis clubbing. Trace edema  Neuro: AAOx3, cranial nerves grossly intact.  Skin: Without rashes exudates or nodules  Psych: Normal affect and demeanor with intact judgement and insight  Data Review   Micro Results Recent Results (from the past 240 hour(s))  MRSA PCR SCREENING     Status: None   Collection Time    06/01/13  3:54 AM      Result Value Ref Range Status   MRSA by PCR NEGATIVE  NEGATIVE Final   Comment:            The GeneXpert MRSA Assay (FDA     approved for NASAL specimens     only), is one component of a     comprehensive MRSA colonization     surveillance program. It is not     intended to diagnose MRSA     infection nor to guide or     monitor treatment for     MRSA infections.    Radiology Reports Dg Chest Portable 1 View  05/31/2013   CLINICAL DATA:  Chest pain for 2 days.  EXAM: PORTABLE CHEST - 1 VIEW  COMPARISON:  06/01/2011  FINDINGS: Normal heart, mediastinum hila. There is prominent cardiophrenic angle fat on the right, stable. Clear lungs. No pleural effusion or pneumothorax. The bony thorax is intact.  IMPRESSION: No active disease.   Electronically Signed   By: Amie Portlandavid  Ormond M.D.   On: 05/31/2013 17:42    CBC  Recent Labs Lab 05/31/13 1858 06/01/13 0438 06/02/13 0330  WBC 9.5 9.4 9.1  HGB 12.9 12.2 11.8*  HCT 37.8 35.7* 35.6*  PLT 257 250 225  MCV 92.4 92.2 93.4  MCH 31.5 31.5 31.0  MCHC 34.1 34.2 33.1  RDW 13.5 13.7 13.6  LYMPHSABS 3.1  --   --   MONOABS 0.9  --   --   EOSABS 0.2  --   --   BASOSABS 0.0  --   --     Chemistries   Recent Labs Lab 05/31/13 1858 06/01/13 0438 06/02/13 0330  NA 142 139 144  K 3.8 3.9 4.1  CL 103 103 107  CO2 28 20 24   GLUCOSE 76 116* 109*  BUN 16 12 12   CREATININE 0.85 0.67  0.82  CALCIUM 9.5 8.9 8.7  AST  --  17  --   ALT  --  16  --   ALKPHOS  --  45  --   BILITOT  --  0.5  --    ------------------------------------------------------------------------------------------------------------------ estimated creatinine clearance is 81.5 ml/min (by C-G formula based on Cr of 0.82). ------------------------------------------------------------------------------------------------------------------  Recent Labs  06/01/13 0438  HGBA1C 5.4   ------------------------------------------------------------------------------------------------------------------  Recent Labs  06/01/13 0438  CHOL 204*  HDL 76  LDLCALC 116*  TRIG 60  CHOLHDL 2.7   ------------------------------------------------------------------------------------------------------------------  Recent Labs  06/01/13 0438  TSH 3.693   ------------------------------------------------------------------------------------------------------------------ No results found for this basename: VITAMINB12, FOLATE, FERRITIN, TIBC, IRON, RETICCTPCT,  in the last 72 hours  Coagulation profile No results found for this basename: INR, PROTIME,  in the last 168 hours  No results found for this basename: DDIMER,  in the last 72 hours  Cardiac Enzymes  Recent Labs Lab 05/31/13 1858 06/01/13 0236  TROPONINI 0.33* <0.30   ------------------------------------------------------------------------------------------------------------------ No components found with this basename: POCBNP,     Mckay Tegtmeyer D.O. on 06/02/2013 at 7:29 AM  Between 7am to 7pm - Pager - [phone]  After 7pm go to [email or web address] - password [email or web address]  And look for the night coverage person covering for me after hours  [facility] Office  [phone]

## 2013-06-03 ENCOUNTER — Encounter (HOSPITAL_COMMUNITY)
Admission: EM | Disposition: A | Discharge status: Home / Self Care | Payer: Self-pay | Source: Home / Self Care | Attending: Internal Medicine

## 2013-06-03 DIAGNOSIS — I471 Supraventricular tachycardia, unspecified: Secondary | ICD-10-CM

## 2013-06-03 HISTORY — PX: LEFT HEART CATHETERIZATION WITH CORONARY ANGIOGRAM: SHX5451

## 2013-06-03 LAB — BASIC METABOLIC PANEL
BUN: 14 mg/dL (ref 6–23)
CO2: 23 mEq/L (ref 19–32)
Calcium: 8.8 mg/dL (ref 8.4–10.5)
Chloride: 104 mEq/L (ref 96–112)
Creatinine, Ser: 0.76 mg/dL (ref 0.50–1.10)
GFR calc Af Amer: >90 mL/min (ref >90)
GFR calc non Af Amer: >90 mL/min (ref >90)
Glucose, Bld: 107 mg/dL — ABNORMAL HIGH (ref 70–99)
Potassium: 4.1 mEq/L (ref 3.7–5.3)
Sodium: 138 mEq/L (ref 137–147)

## 2013-06-03 LAB — CBC
HCT: 34.5 % — ABNORMAL LOW (ref 36.0–46.0)
Hemoglobin: 11.7 g/dL — ABNORMAL LOW (ref 12.0–15.0)
MCH: 31.5 pg (ref 26.0–34.0)
MCHC: 33.9 g/dL (ref 30.0–36.0)
MCV: 92.7 fL (ref 78.0–100.0)
Platelets: 208 10*3/uL (ref 150–400)
RBC: 3.72 MIL/uL — ABNORMAL LOW (ref 3.87–5.11)
RDW: 13.4 % (ref 11.5–15.5)
WBC: 8.8 10*3/uL (ref 4.0–10.5)

## 2013-06-03 LAB — PROTIME-INR
INR: 0.95 (ref 0.00–1.49)
Prothrombin Time: 12.5 seconds (ref 11.6–15.2)

## 2013-06-03 LAB — HEPARIN LEVEL (UNFRACTIONATED): Heparin Unfractionated: 0.42 IU/mL (ref 0.30–0.70)

## 2013-06-03 LAB — PREGNANCY, URINE: Preg Test, Ur: NEGATIVE

## 2013-06-03 SURGERY — LEFT HEART CATHETERIZATION WITH CORONARY ANGIOGRAM
Anesthesia: LOCAL

## 2013-06-03 MED ORDER — HEPARIN (PORCINE) IN NACL 2-0.9 UNIT/ML-% IJ SOLN
INTRAMUSCULAR | Status: AC
  Filled 2013-06-03: qty 1500

## 2013-06-03 MED ORDER — LEVALBUTEROL TARTRATE 45 MCG/ACT IN AERO: 2.0000 | INHALATION_SPRAY | Freq: Four times a day (QID) | RESPIRATORY_TRACT | Status: DC | PRN

## 2013-06-03 MED ORDER — HEPARIN SODIUM (PORCINE) 1000 UNIT/ML IJ SOLN
INTRAMUSCULAR | Status: AC
  Filled 2013-06-03: qty 1

## 2013-06-03 MED ORDER — SODIUM CHLORIDE 0.9 % IV SOLN
INTRAVENOUS | Status: AC
  Administered 2013-06-03: 11:00:00 via INTRAVENOUS

## 2013-06-03 MED ORDER — METOPROLOL TARTRATE 25 MG PO TABS: 12.5000 mg | ORAL_TABLET | Freq: Two times a day (BID) | ORAL | Status: DC

## 2013-06-03 MED ORDER — NITROGLYCERIN 0.2 MG/ML ON CALL CATH LAB
INTRAVENOUS | Status: AC
  Filled 2013-06-03: qty 1

## 2013-06-03 MED ORDER — OXYCODONE-ACETAMINOPHEN 5-325 MG PO TABS: 1.0000 | ORAL_TABLET | ORAL | Status: DC | PRN

## 2013-06-03 MED ORDER — FENTANYL CITRATE 0.05 MG/ML IJ SOLN
INTRAMUSCULAR | Status: AC
  Filled 2013-06-03: qty 2

## 2013-06-03 MED ORDER — VERAPAMIL HCL 2.5 MG/ML IV SOLN
INTRAVENOUS | Status: AC
  Filled 2013-06-03: qty 2

## 2013-06-03 MED ORDER — ASPIRIN 81 MG PO TBEC: 81.0000 mg | DELAYED_RELEASE_TABLET | Freq: Every day | ORAL | Status: DC

## 2013-06-03 MED ORDER — LIDOCAINE HCL (PF) 1 % IJ SOLN
INTRAMUSCULAR | Status: AC
  Filled 2013-06-03: qty 30

## 2013-06-03 MED ORDER — MIDAZOLAM HCL 2 MG/2ML IJ SOLN
INTRAMUSCULAR | Status: AC
  Filled 2013-06-03: qty 2

## 2013-06-03 MED ORDER — ALBUTEROL SULFATE (2.5 MG/3ML) 0.083% IN NEBU: 3.0000 mL | INHALATION_SOLUTION | Freq: Four times a day (QID) | RESPIRATORY_TRACT | Status: DC | PRN

## 2013-06-03 MED ORDER — LEVALBUTEROL HCL 0.63 MG/3ML IN NEBU
0.6300 mg | INHALATION_SOLUTION | Freq: Four times a day (QID) | RESPIRATORY_TRACT | Status: DC | PRN
  Filled 2013-06-03: qty 3

## 2013-06-03 NOTE — Discharge Summary (Signed)
DISCHARGE SUMMARY  Gina Bradley  MR#: [id number]  DOB:Oct 09, 1960  Date of Admission: 05/31/2013 Date of Discharge: 06/03/2013  Attending Physician:Gina Bradley T  Patient's MWN:UUVOZ,DGUYQIHK J., MD  Consults: Cardiology - [facility] EP   Disposition: D/C home at pt request   Follow-up Appts:     Follow-up Information   Follow up with Gina Smiles., MD. Schedule an appointment as soon as possible for a visit in 1 week.   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 female with a past medical history of hypercholesterolemia, hypertension, COPD, peripheral neuropathy, cervical spine stenosis, who was in her usual state of health when she noticed that her heart was racing periodically. It would race for a few minutes and then subside on its own. And, then about 30 minutes prior to arrival to the emergency department her heart started racing again, but this time it did not resolve. She started getting dizzy. She started having chest pain, which was in the retrosternal area. Pain was a pressure-like sensation 9/10 in intensity with choking sensation. She got nauseas, and she started sweating. She felt hot. She then decided to come in to the hospital and almost had a passing out episode, although she didn't quite pass out. She had been short of breath as well. She also reported that she does develop chest discomfort and a suffocating feeling with exertion, which had been ongoing for many months.   Hospital Course:  SVT  - Probable AVNRT -EP consulted and "discussed treatment options including rate controlling drugs and catheter ablation" - has chosen to take medical therapy which in  her is limited by hypotension - she would like to followup with her PCP and not cardiology as per her discussion w/ Gina Bradley -TSH 3.693  -2D echo: EF 65-70%, Grade 1 diastolic dysfunction  -Continue metoprolol 12.5mg  BID at d/c  - D/C home at request of pt   Chest Pain with abdnormal EKG  -She has multiple risk factors for CAD, including family history, HTN, Hyperlipidemia, smoking  -Cardiology consulted, Cardiac cath on 06/03/13 revealed normal coronaries  -Troponin was 0.33, however, subsequent trop < 0.30 (likely elevated secondary to SVT)  -Continue aspirin, metoprolol  Hypertension  -Losartan held, as patient was mildly hypotensive  -Continue metoprolol instead at time of d/c   COPD  -quit smoking in 2010 - will provide w/ Xopenex MDI for prn use instead of albuterol given SVT  Peripheral neuropathy  -Continue with Neurontin  Hypercholesterolemia  -Continue atorvastatin  -Lipid profile: 204/60/76/116   Anxiety  -Continue xanax PRN   GERD  -Continue PPI  Echocardiogram 06/01/13  - Left ventricle: The cavity size was normal. Wall thickness was normal. Systolic function was vigorous. The estimated ejection fraction was in the range of 65% to 70%. Wall motion was normal; there were no regional wall motion abnormalities. Doppler parameters are consistent with abnormal left ventricular relaxation (grade 1 diastolic dysfunction). - Right atrium: Central venous pressure: 8mm Hg (est). - Tricuspid valve: Mild regurgitation. - Pulmonary arteries: PA peak pressure: 24mm Hg (S). - Pericardium, extracardiac: A prominent pericardial fat pad was present. Impressions: - Normal LV wall thickness with LVEF 65-70%, grade 1 diastolic dysfunction. Mild tricuspid regurgitation with PASP  24 mmHg. Prominent epicardial fat pad    Medication List    STOP taking these medications       albuterol 108 (90 BASE) MCG/ACT inhaler  Commonly known as:  PROVENTIL HFA;VENTOLIN HFA     losartan 100  MG tablet  Commonly known as:  COZAAR      TAKE these medications       ALPRAZolam 1 MG tablet  Commonly known as:  XANAX  Take 1 mg by mouth 3 (three) times daily as needed. Anxiety     aspirin 81 MG EC tablet  Take 1 tablet (81 mg total) by mouth daily.     atorvastatin 20 MG tablet  Commonly known as:  LIPITOR  Take 20 mg by mouth daily.     gabapentin 100 MG capsule  Commonly known as:  NEURONTIN  Take 100 mg by mouth 3 (three) times daily.     HYDROcodone-acetaminophen 10-325 MG per tablet  Commonly known as:  NORCO  Take 1 tablet by mouth every 6 (six) hours as needed. pain     levalbuterol 45 MCG/ACT inhaler  Commonly known as:  XOPENEX HFA  Inhale 2 puffs into the lungs every 6 (six) hours as needed for wheezing.     metoprolol tartrate 25 MG tablet  Commonly known as:  LOPRESSOR  Take 0.5 tablets (12.5 mg total) by mouth 2 (two) times daily.     pantoprazole 40 MG tablet  Commonly known as:  PROTONIX  Take 40 mg by mouth 2 (two) times daily.       Day of Discharge BP 109/51  Pulse 75  Temp(Src) 97.5 F (36.4 C) (Oral)  Resp 20  Ht 5\' 1"  (1.549 m)  Wt 90.7 kg (199 lb 15.3 oz)  BMI 37.80 kg/m2  SpO2 96%  LMP 05/05/2013  Physical Exam: General: No acute respiratory distress - alert and conversant  Lungs: Clear to auscultation bilaterally without wheezes or crackles Cardiovascular: Regular rate and rhythm without murmur gallop or rub Abdomen: Nontender, nondistended, soft, bowel sounds positive, no rebound, no ascites, no appreciable mass Extremities: No significant cyanosis, clubbing, or edema bilateral lower extremities  Results for orders placed during the hospital encounter of 05/31/13 (from the past 24 hour(s))  CBC     Status: Abnormal   Collection Time    06/03/13  3:10 AM      Result Value Ref Range   WBC 8.8  4.0 - 10.5 K/uL   RBC 3.72 (*) 3.87 - 5.11 MIL/uL   Hemoglobin 11.7 (*) 12.0 - 15.0 g/dL   HCT 24.434.5 (*) 01.036.0 - 27.246.0 %   MCV 92.7   78.0 - 100.0 fL   MCH 31.5  26.0 - 34.0 pg   MCHC 33.9  30.0 - 36.0 g/dL   RDW 53.613.4  64.411.5 - 03.415.5 %   Platelets 208  150 - 400 K/uL  BASIC METABOLIC PANEL     Status: Abnormal   Collection Time    06/03/13  3:10 AM      Result Value Ref Range   Sodium 138  137 - 147 mEq/L   Potassium 4.1  3.7 - 5.3 mEq/L   Chloride 104  96 - 112 mEq/L   CO2 23  19 - 32 mEq/L   Glucose, Bld 107 (*) 70 - 99 mg/dL   BUN 14  6 - 23 mg/dL   Creatinine, Ser 7.420.76  0.50 - 1.10 mg/dL   Calcium 8.8  8.4 - 59.510.5 mg/dL  GFR calc non Af Amer >90  >90 mL/min   GFR calc Af Amer >90  >90 mL/min   Time spent in discharge (includes decision making & examination of pt): 30 minutes  06/03/2013, 5:06 PM   Lonia Blood, MD [facility] Office  [phone] Pager [phone]  On-Call/Text Page:      [email or web address]      password [facility]

## 2013-06-03 NOTE — Discharge Instructions (Signed)
Supraventricular Tachycardia °Supraventricular tachycardia (SVT) is an abnormal heart rhythm (arrhythmia) that causes the heart to beat very fast (tachycardia). This kind of fast heartbeat originates in the upper chambers of the heart (atria). SVT can cause the heart to beat greater than 100 beats per minute. SVT can have a rapid burst of heartbeats. This can start and stop suddenly without warning and is called nonsustained. SVT can also be sustained, in which the heart beats at a continuous fast rate.  °CAUSES  °There can be different causes of SVT. Some of these include: °· Heart valve problems such as mitral valve prolapse. °· An enlarged heart (hypertrophic cardiomyopathy). °· Congenital heart problems. °· Heart inflammation (pericarditis). °· Hyperthyroidism. °· Low potassium or magnesium levels. °· Caffeine. °· Drug use such as cocaine, methamphetamines, or stimulants. °· Some over-the-counter medicines such as: °· Decongestants. °· Diet medicines. °· Herbal medicines. °SYMPTOMS  °Symptoms of SVT can vary. Symptoms depend on whether the SVT is sustained or nonsustained. You may experience: °· No symptoms (asymptomatic). °· An awareness of your heart beating rapidly (palpitations). °· Shortness of breath. °· Chest pain or pressure. °If your blood pressure drops because of the SVT, you may experience: °· Fainting or near fainting. °· Weakness. °· Dizziness. °DIAGNOSIS  °Different tests can be performed to diagnose SVT, such as: °· An electrocardiogram (EKG). This is a painless test that records the electrical activity of your heart. °· Holter monitor. This is a 24 hour recording of your heart rhythm. You will be given a diary. Write down all symptoms that you have and what you were doing at the time you experienced symptoms. °· Arrhythmia monitor. This is a small device that your wear for several weeks. It records the heart rhythm when you have symptoms. °· Echocardiogram. This is an imaging test to help detect  abnormal heart structure such as congenital abnormalities, heart valve problems, or heart enlargement. °· Stress test. This test can help determine if the SVT is related to exercise. °· Electrophysiology study (EPS). This is a procedure that evaluates your heart's electrical system and can help your caregiver find the cause of your SVT. °TREATMENT  °Treatment of SVT depends on the symptoms, how often it recurs, and whether there are any underlying heart problems.  °· If symptoms are rare and no other cardiac disease is present, no treatment may be needed. °· Blood work may be done to check potassium, magnesium, and thyroid hormone levels to see if they are abnormal. If these levels are abnormal, treatment to correct the problems will occur. °Medicines °Your caregiver may use oral medicines to treat SVT. These medicines are given for long-term control of SVT. Medicines may be used alone or in combination with other treatments. These medicines work to slow nerve impulses in the heart muscle. These medicines can also be used to treat high blood pressure. Some of these medicines may include: °· Calcium channel blockers. °· Beta blockers. °· Digoxin. °Nonsurgical procedures °Nonsurgical techniques may be used if oral medicines do not work. Some examples include: °· Cardioversion. This technique uses either drugs or an electrical shock to restore a normal heart rhythm. °· Cardioversion drugs may be given through an intravenous (IV) line to help "reset" the heart rhythm. °· In electrical cardioversion, the caregiver shocks your heart to stop its beat for a split second. This helps to reset the heart to a normal rhythm. °· Ablation. This procedure is done under mild sedation. High frequency radio wave energy is used to   destroy the area of heart tissue responsible for the SVT. °HOME CARE INSTRUCTIONS  °· Do not smoke. °· Only take medicines prescribed by your caregiver. Check with your caregiver before using over-the-counter  medicines. °· Check with your caregiver about how much alcohol and caffeine (coffee, tea, colas, or chocolate) you may have. °· It is very important to keep all follow-up referrals and appointments in order to properly manage this problem. °SEEK IMMEDIATE MEDICAL CARE IF: °· You have dizziness. °· You faint or nearly faint. °· You have shortness of breath. °· You have chest pain or pressure. °· You have sudden nausea or vomiting. °· You have profuse sweating. °· You are concerned about how long your symptoms last. °· You are concerned about the frequency of your SVT episodes. °If you have the above symptoms, call your local emergency services (911 in [place] immediately. Do not drive yourself to the hospital. °MAKE SURE YOU:  °· Understand these instructions. °· Will watch your condition. °· Will get help right away if you are not doing well or get worse. °Document Released: 03/21/2005 Document Revised: 06/13/2011 Document Reviewed: 07/03/2008 °[employer]® Patient Information ©2014 [employer]. ° °

## 2013-06-03 NOTE — Consult Note (Signed)
ELECTROPHYSIOLOGY CONSULT NOTE  Patient ID: Gina Bradley, MRN: [id number], DOB/AGE: [birth year] 53 y.o. Admit date: 05/31/2013 Date of Consult: 06/03/2013  Primary Physician: [name], MD Primary Cardiologist:  New  Chief Complaint:  SVT   HPI Gina Bradley is a 53 y.o. female  Seen for recurrent SVT  These spells have been going on for somewhere between 2 and 30 yrs ( shes not sure) They can happen many times a week and are abrupt in onset and offset, perhaps triggered by caffeine or stress.  They are assoc with sob, presyncope and tachypalp  They may be frog positive, but not clearly associated with diuresis  This episode was more severe, lastiang perhaps 45 min with prog SOB and LH  It was terminated by adenosine in the ER.  She also had CP and tn+ so underwent cath today with normal CA and normal LVEF  She has COPD and chronic DOE; HTN  She stopped smoking about 5 yrs ago  In hospital she has had modest blood pressure     Past Medical History  Diagnosis Date  . COPD (chronic obstructive pulmonary disease)   . Hypertension       Surgical History:  Past Surgical History  Procedure Laterality Date  . Cesarean section       Home Meds: Prior to Admission medications   Medication Sig Start Date End Date Taking? Authorizing Provider  albuterol (PROVENTIL HFA;VENTOLIN HFA) 108 (90 BASE) MCG/ACT inhaler Inhale 2 puffs into the lungs every 6 (six) hours as needed for wheezing or shortness of breath.   Yes Historical Provider, MD  ALPRAZolam Prudy Feeler) 1 MG tablet Take 1 mg by mouth 3 (three) times daily as needed. Anxiety 05/03/13  Yes Historical Provider, MD  atorvastatin (LIPITOR) 20 MG tablet Take 20 mg by mouth daily. 03/04/13  Yes Historical Provider, MD  gabapentin (NEURONTIN) 100 MG capsule Take 100 mg by mouth 3 (three) times daily. 05/25/13  Yes Historical Provider, MD  HYDROcodone-acetaminophen (NORCO) 10-325 MG per tablet Take 1 tablet by mouth every 6 (six)  hours as needed. pain 05/03/13  Yes Historical Provider, MD  losartan (COZAAR) 100 MG tablet Take 50 mg by mouth daily. 04/18/13  Yes Historical Provider, MD  pantoprazole (PROTONIX) 40 MG tablet Take 40 mg by mouth 2 (two) times daily. 05/25/13  Yes Historical Provider, MD    Inpatient Medications:  . aspirin EC  81 mg Oral Daily  . gabapentin  100 mg Oral TID  . metoprolol tartrate  12.5 mg Oral BID  . pantoprazole  40 mg Oral BID      Allergies: No Known Allergies  History   Social History  . Marital Status: Married    Spouse Name: N/A    Number of Children: N/A  . Years of Education: N/A   Occupational History  . Not on file.   Social History Main Topics  . Smoking status: Former Smoker -- 1.00 packs/day for 20 years    Quit date: 05/31/2008  . Smokeless tobacco: Not on file  . Alcohol Use: Yes     Comment: occasionally  . Drug Use: No  . Sexual Activity: Not on file   Other Topics Concern  . Not on file   Social History Narrative  . No narrative on file     Family History  Problem Relation Age of Onset  . Heart attack Mother   . Diabetes Mother   . Diabetes Father   . Suicidality Sister  ROS:  Please see the history of present illness.     All other systems reviewed and negative.    Physical Exam:   Blood pressure 112/29, pulse 60, temperature 98.8 F (37.1 C), temperature source Oral, resp. rate 18, height 5\' 1"  (1.549 m), weight 199 lb 15.3 oz (90.7 kg), last menstrual period 05/05/2013, SpO2 96.00%. General: Well developed, well nourished female in no acute distress. Head: Normocephalic, atraumatic, sclera non-icteric, no xanthomas, nares are without discharge. EENT: normal Lymph Nodes:  none Back: without scoliosis/kyphosisno CVA tendersness Neck: Negative for carotid bruits. JVD not elevated. Lungs: Clear bilaterally to auscultation without wheezes, rales, or rhonchi. Breathing is unlabored. Heart: RRR with S1 S2.  2/6 systolic murmur , rubs,  or gallops appreciated. Abdomen: Soft, non-tender, non-distended with normoactive bowel sounds. No hepatomegaly. No rebound/guarding. No obvious abdominal masses. Msk:  Strength and tone appear normal for age. Extremities: No clubbing or cyanosis. No edema.  Distal pedal pulses are 2+ and equal bilaterally. Skin: Warm and Dry Neuro: Alert and oriented X 3. CN III-XII intact Grossly normal sensory and motor function . Psych:  Responds to questions appropriately with a normal affect.      Labs: Cardiac Enzymes  Recent Labs  05/31/13 1858 06/01/13 0236  TROPONINI 0.33* <0.30   CBC Lab Results  Component Value Date   WBC 8.8 06/03/2013   HGB 11.7* 06/03/2013   HCT 34.5* 06/03/2013   MCV 92.7 06/03/2013   PLT 208 06/03/2013   PROTIME:  Recent Labs  06/03/13 0310  LABPROT 12.5  INR 0.95   Chemistry  Recent Labs Lab 06/01/13 0438  06/03/13 0310  NA 139  < > 138  K 3.9  < > 4.1  CL 103  < > 104  CO2 20  < > 23  BUN 12  < > 14  CREATININE 0.67  < > 0.76  CALCIUM 8.9  < > 8.8  PROT 6.9  --   --   BILITOT 0.5  --   --   ALKPHOS 45  --   --   ALT 16  --   --   AST 17  --   --   GLUCOSE 116*  < > 107*  < > = values in this interval not displayed. Lipids Lab Results  Component Value Date   CHOL 204* 06/01/2013   HDL 76 06/01/2013   LDLCALC 116* 06/01/2013   TRIG 60 06/01/2013   BNP Pro B Natriuretic peptide (BNP)  Date/Time Value Ref Range Status  06/01/2013  4:38 AM 219.7* 0 - 125 pg/mL Final   Miscellaneous Lab Results  Component Value Date   DDIMER 0.38 06/01/2011    Radiology/Studies:  Dg Chest Portable 1 View  05/31/2013   CLINICAL DATA:  Chest pain for 2 days.  EXAM: PORTABLE CHEST - 1 VIEW  COMPARISON:  06/01/2011  FINDINGS: Normal heart, mediastinum hila. There is prominent cardiophrenic angle fat on the right, stable. Clear lungs. No pleural effusion or pneumothorax. The bony thorax is intact.  IMPRESSION: No active disease.   Electronically Signed   By: Amie Portlandavid   Ormond M.D.   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 She lives in [place] area  Discharge her on low dose bb     Sherryl Manges

## 2013-06-03 NOTE — H&P (View-Only) (Signed)
Patient ID: Gina Bradley, female   DOB: [birth year], 53 y.o.   MRN: [id number]   SUBJECTIVE: She had some chest pain yesterday afternoon, no further pain today.  Feels better.  No further SVT.  SBP continues to run on the low side, in the 90s-100s range.   Scheduled Meds: . aspirin EC  81 mg Oral Daily  . atorvastatin  80 mg Oral Daily  . gabapentin  100 mg Oral TID  . metoprolol tartrate  12.5 mg Oral BID  . pantoprazole  40 mg Oral BID  . sodium chloride  3 mL Intravenous Q12H   Continuous Infusions: . sodium chloride 50 mL/hr at 06/01/13 1900  . heparin 1,100 Units/hr (06/01/13 1900)   PRN Meds:.acetaminophen, acetaminophen, albuterol, ALPRAZolam, alum & mag hydroxide-simeth, HYDROcodone-acetaminophen, morphine injection, ondansetron (ZOFRAN) IV, ondansetron    Filed Vitals:   06/02/13 0000 06/02/13 0400 06/02/13 0803 06/02/13 1215  BP: 104/60 118/91 93/58 119/61  Pulse: 70 66    Temp:  97.3 F (36.3 C) 98.6 F (37 C) 98.5 F (36.9 C)  TempSrc:  Oral Oral Oral  Resp: 16  16   Height:      Weight:  89 kg (196 lb 3.4 oz)    SpO2:  100% 100% 100%    Intake/Output Summary (Last 24 hours) at 06/02/13 1222 Last data filed at 06/02/13 0800  Gross per 24 hour  Intake   1883 ml  Output   2450 ml  Net   -567 ml    LABS: Basic Metabolic Panel:  Recent Labs  78/29/56 0438 06/02/13 0330  NA 139 144  K 3.9 4.1  CL 103 107  CO2 20 24  GLUCOSE 116* 109*  BUN 12 12  CREATININE 0.67 0.82  CALCIUM 8.9 8.7   Liver Function Tests:  Recent Labs  06/01/13 0438  AST 17  ALT 16  ALKPHOS 45  BILITOT 0.5  PROT 6.9  ALBUMIN 3.6   No results found for this basename: LIPASE, AMYLASE,  in the last 72 hours CBC:  Recent Labs  05/31/13 1858 06/01/13 0438 06/02/13 0330  WBC 9.5 9.4 9.1  NEUTROABS 5.4  --   --   HGB 12.9 12.2 11.8*  HCT 37.8 35.7* 35.6*  MCV 92.4 92.2 93.4  PLT 257 250 225   Cardiac Enzymes:  Recent Labs  05/31/13 1858 06/01/13 0236    TROPONINI 0.33* <0.30   BNP: No components found with this basename: POCBNP,  D-Dimer: No results found for this basename: DDIMER,  in the last 72 hours Hemoglobin A1C:  Recent Labs  06/01/13 0438  HGBA1C 5.4   Fasting Lipid Panel:  Recent Labs  06/01/13 0438  CHOL 204*  HDL 76  LDLCALC 116*  TRIG 60  CHOLHDL 2.7   Thyroid Function Tests:  Recent Labs  06/01/13 0438  TSH 3.693   Anemia Panel: No results found for this basename: VITAMINB12, FOLATE, FERRITIN, TIBC, IRON, RETICCTPCT,  in the last 72 hours  RADIOLOGY: Dg Chest Portable 1 View  05/31/2013   CLINICAL DATA:  Chest pain for 2 days.  EXAM: PORTABLE CHEST - 1 VIEW  COMPARISON:  06/01/2011  FINDINGS: Normal heart, mediastinum hila. There is prominent cardiophrenic angle fat on the right, stable. Clear lungs. No pleural effusion or pneumothorax. The bony thorax is intact.  IMPRESSION: No active disease.   Electronically Signed   By: Amie Portland M.D.   On: 05/31/2013 17:42    PHYSICAL EXAM General: NAD Neck: No JVD,  no thyromegaly or thyroid nodule.  Lungs: Clear to auscultation bilaterally with normal respiratory effort. CV: Nondisplaced PMI.  Heart regular S1/S2, no S3/S4, no murmur.  No peripheral edema.  No carotid bruit.  Normal pedal pulses.  Abdomen: Soft, nontender, no hepatosplenomegaly, no distention.  Neurologic: Alert and oriented x 3.  Psych: Normal affect. Extremities: No clubbing or cyanosis.   TELEMETRY: Reviewed telemetry pt in NSR  ASSESSMENT AND PLAN: 53 yo presented with SVT and chest pain. The SVT looked like AVNRT with pseudo-r' on ECG. She has been in NSR here.  1 elevated troponin + CP with SVT. She also reports chest pressure with exertion prior to this at home for at least a year.  1. SVT: Suspect AVNRT with pseudo-r' on 12-lead ECG with SVT. BP runs on the low side and limits uptitration of metoprolol (now on 12.5 mg bid).  She has had periodic runs of symptoms consistent with  SVT, Friday's was the most prolonged and symptomatic.  Would have EP evaluate her, would consider AVNRT ablation given severe symptoms and difficulty with uptitration of beta blocker.  2. CAD: Patient has severe chest tightness with SVT and 1 elevated troponin (others normal).  This could be demand ischemia, but she has been having exertional chest tightness for up to a year now. She has a strong family history of premature CAD.  I think that she will need left heart catheterization.  Will arrange for this to be done tomorrow.  Continue ASA, statin, heparin gtt for now.   Marca AnconaDalton Quasim Doyon 06/02/2013 12:27 PM

## 2013-06-03 NOTE — Progress Notes (Signed)
TR BAND REMOVAL  LOCATION:    right radial  DEFLATED PER PROTOCOL:    yes  TIME BAND OFF / DRESSING APPLIED:    1300   SITE UPON ARRIVAL:    Level 0  SITE AFTER BAND REMOVAL:    Level 0  REVERSE ALLEN'S TEST:     positive  CIRCULATION SENSATION AND MOVEMENT:    Within Normal Limits   yes  COMMENTS:   Gauze dressing applied secured with tegaderm, Radial site rechecked at 1330 and assessment without change, dressing dry and intact, CSMs wnls, radial and ulnar pulses +2 and no complaints of tenderness, level 0.

## 2013-06-03 NOTE — CV Procedure (Signed)
     Left Heart Catheterization with Coronary Angiography  Report  Gina Bradley  53 y.o.  female 10/28/1960  Procedure Date: 06/03/2013 Referring Physician: Golden Circle. McLean, MD Primary Cardiologist: Golden Circle. McLean  INDICATIONS: Angina during tachycardia and trace elevated troponin biomarker  PROCEDURE: 1. Left heart catheterization; 2. Coronary angiography; 3. Left ventriculography  CONSENT:  The risks, benefits, and details of the procedure were explained in detail to the patient. Risks including death, stroke, heart attack, kidney injury, allergy, limb ischemia, bleeding and radiation injury were discussed.  The patient verbalized understanding and wanted to proceed.  Informed written consent was obtained.  PROCEDURE TECHNIQUE:  After Xylocaine anesthesia a 5 French sheath was placed in the right radial artery with a single anterior and posterior wall stick utilizing an Angiocath and the Seldinger technique..  Coronary angiography was done using a 5 [place] JL 3.5 and JR 4 diagnostic catheters.  Left ventriculography was done using the 5 [place] JR 4 catheter and hand injection.   Hemostasis was achieved with a wristband.  MEDICATIONS: IV Versed 4 mg; fentanyl 75 mcg  CONTRAST:  Total of 45 cc.  COMPLICATIONS:  None   HEMODYNAMICS:  Aortic pressure 110/73 mmHg; LV pressure 110/12 mmHg; LVEDP 17 mm mercury  ANGIOGRAPHIC DATA:   The left main coronary artery is normal.  The left anterior descending artery is normal.  The left circumflex artery is normal.  The right coronary artery is normal.   LEFT VENTRICULOGRAM:  Left ventricular angiogram was done in the 30 RAO projection and revealed normal global contractility with LVEF 60%   IMPRESSIONS:  1. Normal coronary arteries  2. Normal left ventricular systolic function, LVEF 60%   RECOMMENDATION:  EP consultation to consider ablation, per Dr. Shirlee LatchMcLean  Eligible for discharge later today..[name]

## 2013-06-03 NOTE — Interval H&P Note (Signed)
History and Physical Interval Note:  06/03/2013 9:42 AM Patient interviewed. History obtained. Procedure and risks discussed in detail including stroke, death, MI, allergy, and kidney injury.Cath Lab Visit (complete for each Cath Lab visit)  Clinical Evaluation Leading to the Procedure:   ACS: yes  Non-ACS:    Anginal Classification: CCS II  Anti-ischemic medical therapy: Minimal Therapy (1 class of medications)  Non-Invasive Test Results: No non-invasive testing performed  Prior CABG: No previous CABG       Gina Bradley  has presented today for surgery, with the diagnosis of cp  The various methods of treatment have been discussed with the patient and family. After consideration of risks, benefits and other options for treatment, the patient has consented to  Procedure(s): LEFT HEART CATHETERIZATION WITH CORONARY ANGIOGRAM (N/A) as a surgical intervention .  The patient's history has been reviewed, patient examined, no change in status, stable for surgery.  I have reviewed the patient's chart and labs.  Questions were answered to the patient's satisfaction.     Gina Bradley,Gina Bradley

## 2013-06-03 NOTE — Care Management Note (Signed)
    Page 1 of 1   06/03/2013     8:25:43 AM   CARE MANAGEMENT NOTE 06/03/2013  Patient:  Gina Bradley,Gina Bradley   Account Number:  [id number]  Date Initiated:  06/03/2013  Documentation initiated by:  Junius CreamerWELL,DEBBIE  Subjective/Objective Assessment:   adm w svt     Action/Plan:   lives w husband, pcp dr Lyman Bishoplawrence fusco   Anticipated DC Date:     Anticipated DC Plan:        DC Planning Services  CM consult      Choice offered to / List presented to:             Status of service:   Medicare Important Message given?   (If response is "NO", the following Medicare IM given date fields will be blank) Date Medicare IM given:   Date Additional Medicare IM given:    Discharge Disposition:    Per UR Regulation:  Reviewed for med. necessity/level of care/duration of stay  If discussed at Long Length of Stay Meetings, dates discussed:    Comments:

## 2013-06-03 NOTE — Interval H&P Note (Signed)
History and Physical Interval Note:  06/03/2013 10:49 AM  Gina DinningAngela J Stopa  has presented today for surgery, with the diagnosis of cp  The various methods of treatment have been discussed with the patient and family. After consideration of risks, benefits and other options for treatment, the patient has consented to  Procedure(s): LEFT HEART CATHETERIZATION WITH CORONARY ANGIOGRAM (N/A) as a surgical intervention .  The patient's history has been reviewed, patient examined, no change in status, stable for surgery.  I have reviewed the patient's chart and labs.  Questions were answered to the patient's satisfaction.     [name],HENRY W

## 2013-06-04 ENCOUNTER — Ambulatory Visit (HOSPITAL_COMMUNITY)
Admission: RE | Admit: 2013-06-04 | Payer: BC Managed Care – PPO | Source: Ambulatory Visit | Admitting: Internal Medicine

## 2013-06-04 ENCOUNTER — Encounter (HOSPITAL_COMMUNITY): Admission: RE | Payer: Self-pay | Source: Ambulatory Visit

## 2013-06-04 SURGERY — SUPRAVENTRICULAR TACHYCARDIA ABLATION
Anesthesia: LOCAL

## 2013-07-04 ENCOUNTER — Encounter: Payer: BC Managed Care – PPO | Admitting: Internal Medicine

## 2013-07-12 ENCOUNTER — Ambulatory Visit (INDEPENDENT_AMBULATORY_CARE_PROVIDER_SITE_OTHER): Payer: BC Managed Care – PPO | Admitting: Internal Medicine

## 2013-07-12 ENCOUNTER — Encounter: Payer: Self-pay | Admitting: Internal Medicine

## 2013-07-12 VITALS — BP 104/61 | HR 67 | Ht 61.0 in | Wt 201.1 lb

## 2013-07-12 DIAGNOSIS — I498 Other specified cardiac arrhythmias: Secondary | ICD-10-CM

## 2013-07-12 DIAGNOSIS — I471 Supraventricular tachycardia: Secondary | ICD-10-CM

## 2013-07-12 NOTE — Patient Instructions (Signed)
Your physician recommends that you schedule a follow-up appointment in: 6 months with Dr Court Joyaylor You will receive a reminder letter two months in advance reminding you to call and schedule your appointment. If you don't receive this letter, please contact our office.   When you decide on the ablation, please call our office to schedule.

## 2013-07-14 ENCOUNTER — Encounter: Payer: Self-pay | Admitting: Internal Medicine

## 2013-07-14 NOTE — Progress Notes (Signed)
HPI Gina Bradley is referred today for evaluation of SVT. She is a pleasant middle aged woman with a h/o tachypalpitations and documented SVT when she presented with incessant SVT and ruled in for a NSTEMI. She elected not to undergo catheter ablation at that time. She has not had recurrent symptoms since discharge from the hospital. In the interim, she denies chest pain. She has been on beta blockers at low dose. She did have chest pain with her SVT but did not have syncope.  No Known Allergies   Current Outpatient Prescriptions  Medication Sig Dispense Refill  . ALPRAZolam (XANAX) 1 MG tablet Take 1 mg by mouth 3 (three) times daily as needed. Anxiety      . aspirin EC 81 MG EC tablet Take 1 tablet (81 mg total) by mouth daily.      [name] atorvastatin (LIPITOR) 20 MG tablet Take 20 mg by mouth daily.      [name] gabapentin (NEURONTIN) 100 MG capsule Take 100 mg by mouth 3 (three) times daily.      [name] levalbuterol (XOPENEX HFA) 45 MCG/ACT inhaler Inhale 2 puffs into the lungs every 6 (six) hours as needed for wheezing.  1 Inhaler  0  . metoprolol tartrate (LOPRESSOR) 25 MG tablet Take 0.5 tablets (12.5 mg total) by mouth 2 (two) times daily.  30 tablet  0  . pantoprazole (PROTONIX) 40 MG tablet Take 40 mg by mouth 2 (two) times daily.      [name] HYDROcodone-acetaminophen (NORCO) 10-325 MG per tablet Take 1 tablet by mouth every 6 (six) hours as needed. pain       No current facility-administered medications for this visit.     Past Medical History  Diagnosis Date  . COPD (chronic obstructive pulmonary disease)   . Hypertension     ROS:   All systems reviewed and negative except as noted in the HPI.   Past Surgical History  Procedure Laterality Date  . Cesarean section       Family History  Problem Relation Age of Onset  . Heart attack Mother   . Diabetes Mother   . Diabetes Father   . Suicidality Sister      History   Social History  . Marital Status: Married    Spouse  Name: N/A    Number of Children: N/A  . Years of Education: N/A   Occupational History  . Not on file.   Social History Main Topics  . Smoking status: Former Smoker -- 1.00 packs/day for 20 years    Quit date: 05/31/2008  . Smokeless tobacco: Not on file  . Alcohol Use: Yes     Comment: occasionally  . Drug Use: No  . Sexual Activity: Not on file   Other Topics Concern  . Not on file   Social History Narrative  . No narrative on file     BP 104/61  Pulse 67  Ht 5\' 1"  (1.549 m)  Wt 201 lb 1.9 oz (91.227 kg)  BMI 38.02 kg/m2  SpO2 100%  Physical Exam:  Well appearing middle aged woman, NAD HEENT: Unremarkable Neck:  No JVD, no thyromegally Back:  No CVA tenderness Lungs:  Clear with no wheezes HEART:  Regular rate rhythm, no murmurs, no rubs, no clicks Abd:  soft, positive bowel sounds, no organomegally, no rebound, no guarding Ext:  2 plus pulses, no edema, no cyanosis, no clubbing Skin:  No rashes no nodules Neuro:  CN II through XII  intact, motor grossly intact  EKG - nsr with no pre-excitation   Assess/Plan:

## 2013-07-14 NOTE — Assessment & Plan Note (Addendum)
I have discussed the treatment options with the patient. The risks/benefits/goals/expectations of catheter ablation have been discussed and she wishes to consider her options. For now she would like to continue medical therapy, reserving ablation for recurrent SVT.

## 2013-07-22 ENCOUNTER — Emergency Department (HOSPITAL_COMMUNITY)
Admission: EM | Admit: 2013-07-22 | Discharge: 2013-07-22 | Disposition: A | Discharge status: Home / Self Care | Payer: BC Managed Care – PPO | Attending: Emergency Medicine | Admitting: Emergency Medicine

## 2013-07-22 ENCOUNTER — Encounter (HOSPITAL_COMMUNITY): Payer: Self-pay | Admitting: Emergency Medicine

## 2013-07-22 DIAGNOSIS — J4489 Other specified chronic obstructive pulmonary disease: Secondary | ICD-10-CM | POA: Insufficient documentation

## 2013-07-22 DIAGNOSIS — I509 Heart failure, unspecified: Secondary | ICD-10-CM | POA: Insufficient documentation

## 2013-07-22 DIAGNOSIS — Y9389 Activity, other specified: Secondary | ICD-10-CM | POA: Insufficient documentation

## 2013-07-22 DIAGNOSIS — T6391XA Toxic effect of contact with unspecified venomous animal, accidental (unintentional), initial encounter: Secondary | ICD-10-CM | POA: Insufficient documentation

## 2013-07-22 DIAGNOSIS — Z7982 Long term (current) use of aspirin: Secondary | ICD-10-CM | POA: Insufficient documentation

## 2013-07-22 DIAGNOSIS — IMO0001 Reserved for inherently not codable concepts without codable children: Secondary | ICD-10-CM | POA: Insufficient documentation

## 2013-07-22 DIAGNOSIS — I10 Essential (primary) hypertension: Secondary | ICD-10-CM | POA: Insufficient documentation

## 2013-07-22 DIAGNOSIS — I252 Old myocardial infarction: Secondary | ICD-10-CM | POA: Insufficient documentation

## 2013-07-22 DIAGNOSIS — Y9210 Unspecified residential institution as the place of occurrence of the external cause: Secondary | ICD-10-CM | POA: Insufficient documentation

## 2013-07-22 DIAGNOSIS — J449 Chronic obstructive pulmonary disease, unspecified: Secondary | ICD-10-CM | POA: Insufficient documentation

## 2013-07-22 DIAGNOSIS — R21 Rash and other nonspecific skin eruption: Secondary | ICD-10-CM | POA: Insufficient documentation

## 2013-07-22 DIAGNOSIS — Z79899 Other long term (current) drug therapy: Secondary | ICD-10-CM | POA: Insufficient documentation

## 2013-07-22 DIAGNOSIS — Z87891 Personal history of nicotine dependence: Secondary | ICD-10-CM | POA: Insufficient documentation

## 2013-07-22 DIAGNOSIS — T63481A Toxic effect of venom of other arthropod, accidental (unintentional), initial encounter: Secondary | ICD-10-CM

## 2013-07-22 DIAGNOSIS — G5790 Unspecified mononeuropathy of unspecified lower limb: Secondary | ICD-10-CM | POA: Insufficient documentation

## 2013-07-22 MED ORDER — PREDNISONE 20 MG PO TABS: ORAL_TABLET | ORAL | Status: DC

## 2013-07-22 MED ORDER — FAMOTIDINE 20 MG PO TABS: 20.0000 mg | ORAL_TABLET | Freq: Two times a day (BID) | ORAL | Status: DC

## 2013-07-22 MED ORDER — KETOROLAC TROMETHAMINE 30 MG/ML IJ SOLN
30.0000 mg | Freq: Once | INTRAMUSCULAR | Status: AC
  Administered 2013-07-22: 30 mg via INTRAVENOUS
  Filled 2013-07-22: qty 1

## 2013-07-22 MED ORDER — FAMOTIDINE IN NACL 20-0.9 MG/50ML-% IV SOLN
20.0000 mg | Freq: Once | INTRAVENOUS | Status: AC
  Administered 2013-07-22: 20 mg via INTRAVENOUS
  Filled 2013-07-22: qty 50

## 2013-07-22 MED ORDER — METHYLPREDNISOLONE SODIUM SUCC 125 MG IJ SOLR
125.0000 mg | Freq: Once | INTRAMUSCULAR | Status: AC
  Administered 2013-07-22: 125 mg via INTRAVENOUS
  Filled 2013-07-22: qty 2

## 2013-07-22 NOTE — ED Notes (Signed)
Red area marked & dated.

## 2013-07-22 NOTE — ED Notes (Signed)
Pt alert & oriented x4, stable gait. Patient given discharge instructions, paperwork & prescription(s). Patient  instructed to stop at the registration desk to finish any additional paperwork. Patient verbalized understanding. Pt left department w/ no further questions. 

## 2013-07-22 NOTE — ED Notes (Signed)
?  insect bite to rt thigh, Now lg amt of redness and swelling present.  Took benadryl 50 mg at 1 pm, sl nausea, and dry mouth

## 2013-07-22 NOTE — ED Provider Notes (Signed)
CSN: [id number]     Arrival date & time 07/22/13  1910 History   This chart was scribed for [name], ED Scribe. This patient was seen in room APA12/APA12 and the patient's care was started at .8:03 PM    Chief Complaint  Patient presents with  . Insect Bite    The history is provided by the patient. No language interpreter was used.    HPI Comments: Gina Bradley is a 53 y.o. female who presents to the Emergency Department complaining of an area of burning pain to her right posterior medial thigh onset 8 hours ago. She reports some associated itching to the area. Patient states that she is having associated burning, redness and itchiness on her anterior neck onset yesterday. She believes that she may have been bitten by an insect, however, she never saw an insect. Patient states that she was in her truck when the suspected bite occurred and she was wearing her work uniform. 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 Patient states that she is short of breath at baseline due to her COPD but that it has not gotten worse since the possible insect bite. Patient states that after the suspected bite occurred she took Benadryl and applied an ice pack to help with the symptoms. She states that this provided mild relief but the symptoms persisted. Patient denies any tongue swelling or dysphagia. Patient reports that the symptoms she is experiencing are similar, but more severe to prior bee stings she has had. She states that she was stung by a wasp on her left arm last week. She states that she occasionally has some burning and tingling in her bilateral hands and feet associated with her neuropathy diagnosis but states that her current symptoms do not feel similar. She denies having a history of DM. Patient states that he has history of multiple abscesses.  PCP Dr Sherwood GamblerFusco  Past Medical History  Diagnosis Date  . COPD (chronic obstructive pulmonary disease)   .  Hypertension   . Myocardial infarct   . CHF (congestive heart failure)    Past Surgical History  Procedure Laterality Date  . Cesarean section     Family History  Problem Relation Age of Onset  . Heart attack Mother   . Diabetes Mother   . Diabetes Father   . Suicidality Sister    History  Substance Use Topics  . Smoking status: Former Smoker -- 1.00 packs/day for 20 years    Quit date: 05/31/2008  . Smokeless tobacco: Not on file  . Alcohol Use: Yes     Comment: occasionally  Patient states that she is former smoker.  She is a LawyerCNA at [employer]   OB History   Grav Para Term Preterm Abortions TAB SAB Ect Mult Living                 Review of Systems  Skin: Positive for rash.  All other systems reviewed and are negative.  Allergies  Review of patient's allergies indicates no known allergies.  Home Medications   Prior to Admission medications   Medication Sig Start Date End Date Taking? Authorizing Provider  ALPRAZolam Prudy Feeler(XANAX) 1 MG tablet Take 1 mg by mouth 3 (three) times daily as needed. Anxiety 05/03/13   Historical Provider, MD  aspirin EC 81 MG EC tablet Take 1 tablet (81 mg total) by mouth daily. 06/03/13   Lonia BloodJeffrey T McClung, MD  atorvastatin (LIPITOR) 20 MG  tablet Take 20 mg by mouth daily. 03/04/13   Historical Provider, MD  gabapentin (NEURONTIN) 100 MG capsule Take 100 mg by mouth 3 (three) times daily. 05/25/13   Historical Provider, MD  HYDROcodone-acetaminophen (NORCO) 10-325 MG per tablet Take 1 tablet by mouth every 6 (six) hours as needed. pain 05/03/13   Historical Provider, MD  levalbuterol [facility] HFA) 45 MCG/ACT inhaler Inhale 2 puffs into the lungs every 6 (six) hours as needed for wheezing. 06/03/13   Lonia Blood, MD  metoprolol tartrate (LOPRESSOR) 25 MG tablet Take 0.5 tablets (12.5 mg total) by mouth 2 (two) times daily. 06/03/13   Lonia Blood, MD  pantoprazole (PROTONIX) 40 MG tablet Take 40 mg by mouth 2 (two) times daily.  05/25/13   Historical Provider, MD   Triage Vitals: BP 132/76  Pulse 83  Temp(Src) 97.9 F (36.6 C) (Oral)  Resp 20  Ht 5\' 1"  (1.549 m)  Wt 190 lb (86.183 kg)  BMI 35.92 kg/m2  SpO2 100%  LMP 06/11/2013  Vital signs normal    Physical Exam  Nursing note and vitals reviewed. Constitutional: She is oriented to person, place, and time. She appears well-developed and well-nourished.  Non-toxic appearance. She does not appear ill. No distress.  HENT:  Head: Normocephalic and atraumatic.  Right Ear: External ear normal.  Left Ear: External ear normal.  Nose: Nose normal. No mucosal edema or rhinorrhea.  Mouth/Throat: Oropharynx is clear and moist and mucous membranes are normal. No dental abscesses or uvula swelling.  Eyes: Conjunctivae and EOM are normal. Pupils are equal, round, and reactive to light.  Neck: Normal range of motion and full passive range of motion without pain. Neck supple.  Cardiovascular: Normal rate, regular rhythm and normal heart sounds.  Exam reveals no gallop and no friction rub.   No murmur heard. Pulmonary/Chest: Effort normal and breath sounds normal. No respiratory distress. She has no wheezes. She has no rhonchi. She has no rales. She exhibits no tenderness and no crepitus.  Abdominal: Soft. Normal appearance and bowel sounds are normal. She exhibits no distension. There is no tenderness. There is no rebound and no guarding.  Musculoskeletal: Normal range of motion. She exhibits no edema and no tenderness.  Moves all extremities well.   Neurological: She is alert and oriented to person, place, and time. She has normal strength. No cranial nerve deficit.  Skin: Skin is warm, dry and intact. No rash noted. There is erythema. No pallor.  Patient has large area of redness and warmth of her right medial thigh, so please see photo. There is one small area that looks like a small pustule consistent with an insect sting.  Psychiatric: She has a normal mood and  affect. Her speech is normal and behavior is normal. Her mood appears not anxious.         ED Course  Procedures (including critical care time)  Medications  methylPREDNISolone sodium succinate (SOLU-MEDROL) 125 mg/2 mL injection 125 mg (125 mg Intravenous Given 07/22/13 2033)  famotidine (PEPCID) IVPB 20 mg (0 mg Intravenous Stopped 07/22/13 2110)  ketorolac (TORADOL) 30 MG/ML injection 30 mg (30 mg Intravenous Given 07/22/13 2226)   DIAGNOSTIC STUDIES: Oxygen Saturation is 100% on RA, normal by my interpretation.    COORDINATION OF CARE: 8:03 PM- Discussed plan to order medications. Pt advised of plan for treatment and pt agrees.  Patient not given Benadryl because she has to drive herself home. She also are taken from earlier today.  Patient was rechecked prior to discharge. Please see second photo. The redness and warmth was much improved. Patient feels improved enough to go home.  Lab Review Labs Reviewed - No data to display  Imaging Review No results found.   EKG Interpretation None      MDM   Final diagnoses:  Allergic reaction to insect sting    Discharge Medication List as of 07/22/2013 10:16 PM    START taking these medications   Details  famotidine (PEPCID) 20 MG tablet Take 1 tablet (20 mg total) by mouth 2 (two) times daily., Starting 07/22/2013, Until Discontinued, Print    predniSONE (DELTASONE) 20 MG tablet Take 3 po QD x 2d starting tomorrow, then 2 po QD x 3d then 1 po QD x 3d, Print        Plan discharge   Devoria AlbeIva Camiyah Friberg, MD, FACEP   I personally performed the services described in this documentation, which was scribed in my presence. The recorded information has been reviewed and considered.  Devoria AlbeIva Jameria Bradway, MD, FACEP     Ward GivensIva L Lacye Mccarn, MD 07/23/13 0040

## 2013-07-22 NOTE — Discharge Instructions (Signed)
Continue benadryl 50 mg every 4-6 hrs for swelling, redness, or itching. Take the prednisone and pepcid until gone. Use cool compresses or ice packs to the area. Recheck if you get a fever, chils, have difficulty breathing or swallowing or you seem to be getting worse instead of better.

## 2013-07-30 ENCOUNTER — Other Ambulatory Visit (HOSPITAL_COMMUNITY): Payer: Self-pay | Admitting: Internal Medicine

## 2013-07-30 DIAGNOSIS — R109 Unspecified abdominal pain: Secondary | ICD-10-CM

## 2013-08-01 ENCOUNTER — Ambulatory Visit (HOSPITAL_COMMUNITY): Payer: BC Managed Care – PPO

## 2013-08-28 ENCOUNTER — Ambulatory Visit (HOSPITAL_COMMUNITY): Payer: BC Managed Care – PPO

## 2013-08-28 ENCOUNTER — Other Ambulatory Visit (HOSPITAL_COMMUNITY): Payer: BC Managed Care – PPO

## 2013-08-29 ENCOUNTER — Ambulatory Visit (HOSPITAL_COMMUNITY)
Admission: RE | Admit: 2013-08-29 | Discharge: 2013-08-29 | Disposition: A | Discharge status: Home / Self Care | Payer: BC Managed Care – PPO | Source: Ambulatory Visit | Attending: Internal Medicine | Admitting: Internal Medicine

## 2013-08-29 DIAGNOSIS — R109 Unspecified abdominal pain: Secondary | ICD-10-CM

## 2013-08-29 DIAGNOSIS — Q619 Cystic kidney disease, unspecified: Secondary | ICD-10-CM | POA: Insufficient documentation

## 2013-09-09 ENCOUNTER — Emergency Department (HOSPITAL_COMMUNITY)
Admission: EM | Admit: 2013-09-09 | Discharge: 2013-09-09 | Disposition: A | Discharge status: Home / Self Care | Payer: BC Managed Care – PPO | Attending: Emergency Medicine | Admitting: Emergency Medicine

## 2013-09-09 ENCOUNTER — Encounter (HOSPITAL_COMMUNITY): Payer: Self-pay | Admitting: Emergency Medicine

## 2013-09-09 DIAGNOSIS — I10 Essential (primary) hypertension: Secondary | ICD-10-CM | POA: Insufficient documentation

## 2013-09-09 DIAGNOSIS — Z792 Long term (current) use of antibiotics: Secondary | ICD-10-CM | POA: Insufficient documentation

## 2013-09-09 DIAGNOSIS — S71109A Unspecified open wound, unspecified thigh, initial encounter: Principal | ICD-10-CM | POA: Insufficient documentation

## 2013-09-09 DIAGNOSIS — Z7982 Long term (current) use of aspirin: Secondary | ICD-10-CM | POA: Insufficient documentation

## 2013-09-09 DIAGNOSIS — J4489 Other specified chronic obstructive pulmonary disease: Secondary | ICD-10-CM | POA: Insufficient documentation

## 2013-09-09 DIAGNOSIS — I509 Heart failure, unspecified: Secondary | ICD-10-CM | POA: Insufficient documentation

## 2013-09-09 DIAGNOSIS — Y9389 Activity, other specified: Secondary | ICD-10-CM | POA: Insufficient documentation

## 2013-09-09 DIAGNOSIS — Z79899 Other long term (current) drug therapy: Secondary | ICD-10-CM | POA: Insufficient documentation

## 2013-09-09 DIAGNOSIS — J449 Chronic obstructive pulmonary disease, unspecified: Secondary | ICD-10-CM | POA: Insufficient documentation

## 2013-09-09 DIAGNOSIS — Y929 Unspecified place or not applicable: Secondary | ICD-10-CM | POA: Insufficient documentation

## 2013-09-09 DIAGNOSIS — I252 Old myocardial infarction: Secondary | ICD-10-CM | POA: Insufficient documentation

## 2013-09-09 DIAGNOSIS — Z23 Encounter for immunization: Secondary | ICD-10-CM | POA: Insufficient documentation

## 2013-09-09 DIAGNOSIS — S71009A Unspecified open wound, unspecified hip, initial encounter: Secondary | ICD-10-CM | POA: Insufficient documentation

## 2013-09-09 DIAGNOSIS — S71119A Laceration without foreign body, unspecified thigh, initial encounter: Secondary | ICD-10-CM

## 2013-09-09 DIAGNOSIS — W268XXA Contact with other sharp object(s), not elsewhere classified, initial encounter: Secondary | ICD-10-CM | POA: Insufficient documentation

## 2013-09-09 DIAGNOSIS — Z87891 Personal history of nicotine dependence: Secondary | ICD-10-CM | POA: Insufficient documentation

## 2013-09-09 MED ORDER — TETANUS-DIPHTH-ACELL PERTUSSIS 5-2.5-18.5 LF-MCG/0.5 IM SUSP
0.5000 mL | Freq: Once | INTRAMUSCULAR | Status: AC
  Administered 2013-09-09: 0.5 mL via INTRAMUSCULAR
  Filled 2013-09-09: qty 0.5

## 2013-09-09 NOTE — ED Notes (Signed)
Laceration to rt  Thigh, superficial.  Cut on a "rusty nail"

## 2013-09-09 NOTE — ED Notes (Signed)
Pt was moving some stuff today when a "rusty nail" fell out of a box and ran down the side of her leg cutting her in the process. Pt concerned because she "has a lot of health issues and is immunocompromised" and she is "worried about getting a staph infection". Pt does not remember the last time she had a tetanus shot as well.

## 2013-09-11 ENCOUNTER — Encounter (HOSPITAL_COMMUNITY): Payer: Self-pay | Admitting: Emergency Medicine

## 2013-09-11 ENCOUNTER — Emergency Department (HOSPITAL_COMMUNITY)
Admission: EM | Admit: 2013-09-11 | Discharge: 2013-09-11 | Disposition: A | Discharge status: Home / Self Care | Payer: BC Managed Care – PPO | Attending: Emergency Medicine | Admitting: Emergency Medicine

## 2013-09-11 DIAGNOSIS — L989 Disorder of the skin and subcutaneous tissue, unspecified: Secondary | ICD-10-CM | POA: Insufficient documentation

## 2013-09-11 DIAGNOSIS — Z87891 Personal history of nicotine dependence: Secondary | ICD-10-CM | POA: Insufficient documentation

## 2013-09-11 DIAGNOSIS — IMO0002 Reserved for concepts with insufficient information to code with codable children: Secondary | ICD-10-CM | POA: Insufficient documentation

## 2013-09-11 DIAGNOSIS — I509 Heart failure, unspecified: Secondary | ICD-10-CM | POA: Insufficient documentation

## 2013-09-11 DIAGNOSIS — Z79899 Other long term (current) drug therapy: Secondary | ICD-10-CM | POA: Insufficient documentation

## 2013-09-11 DIAGNOSIS — I252 Old myocardial infarction: Secondary | ICD-10-CM | POA: Insufficient documentation

## 2013-09-11 DIAGNOSIS — L988 Other specified disorders of the skin and subcutaneous tissue: Secondary | ICD-10-CM | POA: Insufficient documentation

## 2013-09-11 DIAGNOSIS — L089 Local infection of the skin and subcutaneous tissue, unspecified: Secondary | ICD-10-CM

## 2013-09-11 DIAGNOSIS — Z7982 Long term (current) use of aspirin: Secondary | ICD-10-CM | POA: Insufficient documentation

## 2013-09-11 DIAGNOSIS — I10 Essential (primary) hypertension: Secondary | ICD-10-CM | POA: Insufficient documentation

## 2013-09-11 DIAGNOSIS — J441 Chronic obstructive pulmonary disease with (acute) exacerbation: Secondary | ICD-10-CM | POA: Insufficient documentation

## 2013-09-11 MED ORDER — DOXYCYCLINE HYCLATE 100 MG PO TABS
100.0000 mg | ORAL_TABLET | Freq: Once | ORAL | Status: AC
  Administered 2013-09-11: 100 mg via ORAL
  Filled 2013-09-11: qty 1

## 2013-09-11 MED ORDER — DOXYCYCLINE HYCLATE 100 MG PO CAPS: 100.0000 mg | ORAL_CAPSULE | Freq: Two times a day (BID) | ORAL | Status: DC

## 2013-09-11 NOTE — ED Notes (Signed)
Wounds covered with bandages.

## 2013-09-11 NOTE — ED Provider Notes (Signed)
CSN: [id number]     Arrival date & time 09/11/13  1535 History   First MD Initiated Contact with Patient 09/11/13 1554     Chief Complaint  Patient presents with  . Foot Pain     (Consider location/radiation/quality/duration/timing/severity/associated sxs/prior Treatment) Patient is a 53 y.o. female presenting with lower extremity pain. The history is provided by the patient.  Foot Pain This is a new problem. The current episode started in the past 7 days. The problem occurs constantly. The problem has been gradually worsening. Pertinent negatives include no abdominal pain, arthralgias, chest pain, chills, coughing, fever, nausea, neck pain or weakness. The symptoms are aggravated by standing and walking. She has tried nothing for the symptoms. The treatment provided no relief.    Past Medical History  Diagnosis Date  . COPD (chronic obstructive pulmonary disease)   . Hypertension   . Myocardial infarct   . CHF (congestive heart failure)    Past Surgical History  Procedure Laterality Date  . Cesarean section     Family History  Problem Relation Age of Onset  . Heart attack Mother   . Diabetes Mother   . Diabetes Father   . Suicidality Sister    History  Substance Use Topics  . Smoking status: Former Smoker -- 1.00 packs/day for 20 years    Quit date: 05/31/2008  . Smokeless tobacco: Not on file  . Alcohol Use: Yes     Comment: occasionally   OB History   Grav Para Term Preterm Abortions TAB SAB Ect Mult Living                 Review of Systems  Constitutional: Negative for fever, chills and activity change.       All ROS Neg except as noted in HPI  Eyes: Negative for photophobia and discharge.  Respiratory: Positive for shortness of breath. Negative for cough and wheezing.   Cardiovascular: Negative for chest pain and palpitations.  Gastrointestinal: Negative for nausea, abdominal pain and blood in stool.  Genitourinary: Negative for dysuria, frequency and  hematuria.  Musculoskeletal: Negative for arthralgias, back pain and neck pain.  Skin: Positive for wound.  Neurological: Negative for dizziness, seizures, speech difficulty and weakness.  Psychiatric/Behavioral: Negative for hallucinations and confusion.      Allergies  Review of patient's allergies indicates no known allergies.  Home Medications   Prior to Admission medications   Medication Sig Start Date End Date Taking? Authorizing Provider  ALPRAZolam Prudy Feeler) 1 MG tablet Take 1 mg by mouth 3 (three) times daily as needed. Anxiety 05/03/13   Historical Provider, MD  aspirin EC 81 MG EC tablet Take 1 tablet (81 mg total) by mouth daily. 06/03/13   Lonia Blood, MD  atorvastatin (LIPITOR) 20 MG tablet Take 20 mg by mouth daily. 03/04/13   Historical Provider, MD  cyclobenzaprine (FLEXERIL) 10 MG tablet  07/19/13   Historical Provider, MD  famotidine (PEPCID) 20 MG tablet Take 1 tablet (20 mg total) by mouth 2 (two) times daily. 07/22/13   Ward Givens, MD  fluticasone (CUTIVATE) 0.05 % cream  06/24/13   Historical Provider, MD  furosemide (LASIX) 20 MG tablet Take 20 mg by mouth daily. 07/30/13   Historical Provider, MD  gabapentin (NEURONTIN) 100 MG capsule Take 100 mg by mouth 3 (three) times daily. 05/25/13   Historical Provider, MD  HYDROcodone-acetaminophen (NORCO) 10-325 MG per tablet Take 1 tablet by mouth every 6 (six) hours as needed. pain 05/03/13  Historical Provider, MD  levalbuterol [facility](XOPENEX HFA) 45 MCG/ACT inhaler Inhale 2 puffs into the lungs every 6 (six) hours as needed for wheezing. 06/03/13   Lonia BloodJeffrey T McClung, MD  losartan (COZAAR) 100 MG tablet  04/18/13   Historical Provider, MD  metoprolol tartrate (LOPRESSOR) 25 MG tablet Take 0.5 tablets (12.5 mg total) by mouth 2 (two) times daily. 06/03/13   Lonia BloodJeffrey T McClung, MD  pantoprazole (PROTONIX) 40 MG tablet Take 40 mg by mouth 2 (two) times daily. 05/25/13   Historical Provider, MD  predniSONE (DELTASONE) 20 MG tablet Take 3  po QD x 2d starting tomorrow, then 2 po QD x 3d then 1 po QD x 3d 07/22/13   Ward GivensIva L Knapp, MD   BP 127/74  Pulse 86  Temp(Src) 98.2 F (36.8 C) (Oral)  Resp 20  Ht 5\' 1"  (1.549 m)  Wt 190 lb (86.183 kg)  BMI 35.92 kg/m2  SpO2 97%  LMP 08/27/2013 Physical Exam  Nursing note and vitals reviewed. Constitutional: She is oriented to person, place, and time. She appears well-developed and well-nourished.  Non-toxic appearance.  HENT:  Head: Normocephalic.  Right Ear: Tympanic membrane and external ear normal.  Left Ear: Tympanic membrane and external ear normal.  Eyes: EOM and lids are normal. Pupils are equal, round, and reactive to light.  Neck: Normal range of motion. Neck supple. Carotid bruit is not present.  Cardiovascular: Normal rate, regular rhythm, normal heart sounds, intact distal pulses and normal pulses.   Pulmonary/Chest: Breath sounds normal. No respiratory distress.  Abdominal: Soft. Bowel sounds are normal. There is no tenderness. There is no guarding.  Musculoskeletal: Normal range of motion.  There is a small crusting lesion of the upper anterior thigh without red streaking or increased redness. There is also a small crusting blister lesion of the inner aspect of the right thigh. There is a blister noted of the right third toe. No redness appreciated no lesions noted between the toes. There is no puncture wound of the plantar surface no temperature changes of the right or left lower extremities.  Mild puffiness of the lower extremities. No pitting edema.  Lymphadenopathy:       Head (right side): No submandibular adenopathy present.       Head (left side): No submandibular adenopathy present.    She has no cervical adenopathy.  Neurological: She is alert and oriented to person, place, and time. She has normal strength. No cranial nerve deficit or sensory deficit.  Skin: Skin is warm and dry.  Psychiatric: She has a normal mood and affect. Her speech is normal.    ED  Course  Procedures (including critical care time) Labs Review Labs Reviewed - No data to display  Imaging Review No results found.   EKG Interpretation None      MDM The patient has 2 lesions of the upper thigh and one blister of the right third toe. Vital signs are well within normal limits. Pulse oximetry is 97% on room air. There is no vascular compromise  noted of the lower extremities. Plan - Rx for doxycycline and elevation as often as possible.   Final diagnoses:  None    *I have reviewed nursing notes, vital signs, and all appropriate lab and imaging results for this patient.    Kathie DikeHobson M Necie Wilcoxson, PA-C 09/11/13 1646  Kathie DikeHobson M Dutchess Crosland, PA-C 09/14/13 1747

## 2013-09-11 NOTE — ED Provider Notes (Signed)
CSN: [id number]     Arrival date & time 09/09/13  1524 History   First MD Initiated Contact with Patient 09/09/13 1608     Chief Complaint  Patient presents with  . Laceration     (Consider location/radiation/quality/duration/timing/severity/associated sxs/prior Treatment) Patient is a 53 y.o. female presenting with skin laceration. The history is provided by the patient.  Laceration Location:  Leg Leg laceration location:  R upper leg Length (cm):  5 Depth:  Cutaneous Quality: straight   Bleeding: controlled   Laceration mechanism:  Nail Pain details:    Quality:  Dull   Severity:  Mild   Timing:  Constant   Progression:  Unchanged Foreign body present:  No foreign bodies Relieved by:  Nothing Worsened by:  Nothing tried Ineffective treatments:  None tried Tetanus status:  Out of date   Past Medical History  Diagnosis Date  . COPD (chronic obstructive pulmonary disease)   . Hypertension   . Myocardial infarct   . CHF (congestive heart failure)    Past Surgical History  Procedure Laterality Date  . Cesarean section     Family History  Problem Relation Age of Onset  . Heart attack Mother   . Diabetes Mother   . Diabetes Father   . Suicidality Sister    History  Substance Use Topics  . Smoking status: Former Smoker -- 1.00 packs/day for 20 years    Quit date: 05/31/2008  . Smokeless tobacco: Not on file  . Alcohol Use: Yes     Comment: occasionally   OB History   Grav Para Term Preterm Abortions TAB SAB Ect Mult Living                 Review of Systems  Constitutional: Negative for fever and chills.  Musculoskeletal: Negative for arthralgias, back pain and joint swelling.  Skin: Positive for wound.       Laceration   Neurological: Negative for dizziness, weakness and numbness.  Hematological: Does not bruise/bleed easily.  All other systems reviewed and are negative.     Allergies  Review of patient's allergies indicates no known  allergies.  Home Medications   Prior to Admission medications   Medication Sig Start Date End Date Taking? Authorizing Provider  ALPRAZolam Prudy Feeler) 1 MG tablet Take 1 mg by mouth at bedtime as needed for anxiety or sleep. Anxiety 05/03/13   Historical Provider, MD  aspirin EC 81 MG EC tablet Take 1 tablet (81 mg total) by mouth daily. 06/03/13   Lonia Blood, MD  atorvastatin (LIPITOR) 20 MG tablet Take 20 mg by mouth every evening.  03/04/13   Historical Provider, MD  cyclobenzaprine (FLEXERIL) 10 MG tablet Take 10 mg by mouth daily as needed for muscle spasms.  07/19/13   Historical Provider, MD  diphenhydrAMINE (BENADRYL) 25 MG tablet Take 50 mg by mouth 2 (two) times daily as needed for itching or allergies.    Historical Provider, MD  doxycycline (VIBRAMYCIN) 100 MG capsule Take 1 capsule (100 mg total) by mouth 2 (two) times daily. 09/11/13   Kathie Dike, PA-C  famotidine (PEPCID) 20 MG tablet Take 1 tablet (20 mg total) by mouth 2 (two) times daily. 07/22/13   Ward Givens, MD  fluticasone (CUTIVATE) 0.05 % cream Apply 1 application topically 2 (two) times a week.  06/24/13   Historical Provider, MD  furosemide (LASIX) 20 MG tablet Take 20 mg by mouth every evening.  07/30/13   Historical Provider, MD  gabapentin (  NEURONTIN) 100 MG capsule Take 100 mg by mouth 3 (three) times daily. 05/25/13   Historical Provider, MD  HYDROcodone-acetaminophen (NORCO) 10-325 MG per tablet Take 1 tablet by mouth 4 (four) times daily. pain 05/03/13   Historical Provider, MD  levalbuterol [facility](XOPENEX HFA) 45 MCG/ACT inhaler Inhale 2 puffs into the lungs every 6 (six) hours as needed for wheezing. 06/03/13   Lonia BloodJeffrey T McClung, MD  metoprolol tartrate (LOPRESSOR) 25 MG tablet Take 0.5 tablets (12.5 mg total) by mouth 2 (two) times daily. 06/03/13   Lonia BloodJeffrey T McClung, MD  pantoprazole (PROTONIX) 40 MG tablet Take 40 mg by mouth 2 (two) times daily. 05/25/13   Historical Provider, MD   BP 139/72  Pulse 83  Temp(Src) 98.3  F (36.8 C) (Oral)  Resp 20  Ht 5\' 1"  (1.549 m)  Wt 195 lb (88.451 kg)  BMI 36.86 kg/m2  SpO2 98%  LMP 08/27/2013 Physical Exam  Nursing note and vitals reviewed. Constitutional: She is oriented to person, place, and time. She appears well-developed and well-nourished. No distress.  HENT:  Head: Normocephalic and atraumatic.  Cardiovascular: Normal rate, regular rhythm, normal heart sounds and intact distal pulses.   No murmur heard. Pulmonary/Chest: Effort normal and breath sounds normal. No respiratory distress.  Musculoskeletal: She exhibits no edema and no tenderness.  Neurological: She is alert and oriented to person, place, and time. She exhibits normal muscle tone. Coordination normal.  Skin: Skin is warm. Laceration noted.  Laceration to the lateral right upper thigh.  Lac is very superificial.  No edema or bleeding.     ED Course  Procedures (including critical care time) Labs Review Labs Reviewed - No data to display  Imaging Review No results found.   EKG Interpretation None      MDM   Final diagnoses:  Laceration of thigh without complication    Pt has very superficial lac to thigh.  NV intact.  Td updated.  Wound cleaned and bandaged.  She agrees to return here if any signs of infection.  Ambulates with steady gait.      Ruthel Martine L. Kaylamarie Swickard, PA-C 09/11/13 2315

## 2013-09-11 NOTE — Discharge Instructions (Signed)
Please cleanse the wounds gently with soap and water, please apply a Band-Aid to these areas. Please use doxycycline 2 times daily with food until all taken. Please follow up with your primary physician for recheck of these infected lesions. Please elevate your feet above your waist when sitting or lying please.

## 2013-09-11 NOTE — ED Notes (Signed)
Pt with right foot/toes pain and burning for 2 days, blister noted to third toe of right foot, pt with two sores to right thigh as well

## 2013-09-13 NOTE — ED Provider Notes (Signed)
Medical screening examination/treatment/procedure(s) were performed by non-physician practitioner and as supervising physician I was immediately available for consultation/collaboration.   EKG Interpretation None       Adrena Nakamura, MD 09/13/13 1942 

## 2013-09-16 NOTE — ED Provider Notes (Signed)
Medical screening examination/treatment/procedure(s) were performed by non-physician practitioner and as supervising physician I was immediately available for consultation/collaboration.   EKG Interpretation None        Joya Gaskinsonald W Ulis Kaps, MD 09/16/13 1524

## 2013-11-18 ENCOUNTER — Telehealth: Payer: Self-pay | Admitting: *Deleted

## 2013-11-18 NOTE — Telephone Encounter (Signed)
Pt is calling to see if being anemic has anything to do with her heart condition

## 2013-11-18 NOTE — Telephone Encounter (Signed)
lmtcb 4:58 pm

## 2013-11-19 NOTE — Telephone Encounter (Signed)
Pt is going to see a GI doc(Rourk) and several other docs to evaluate causes of anemia

## 2013-12-04 ENCOUNTER — Encounter (HOSPITAL_COMMUNITY): Payer: Self-pay | Admitting: Emergency Medicine

## 2013-12-04 ENCOUNTER — Emergency Department (HOSPITAL_COMMUNITY)
Admission: EM | Admit: 2013-12-04 | Discharge: 2013-12-04 | Disposition: A | Discharge status: Home / Self Care | Payer: BC Managed Care – PPO | Attending: Emergency Medicine | Admitting: Emergency Medicine

## 2013-12-04 ENCOUNTER — Emergency Department (HOSPITAL_COMMUNITY): Payer: BC Managed Care – PPO

## 2013-12-04 DIAGNOSIS — Z792 Long term (current) use of antibiotics: Secondary | ICD-10-CM | POA: Insufficient documentation

## 2013-12-04 DIAGNOSIS — M1710 Unilateral primary osteoarthritis, unspecified knee: Secondary | ICD-10-CM | POA: Insufficient documentation

## 2013-12-04 DIAGNOSIS — Z87891 Personal history of nicotine dependence: Secondary | ICD-10-CM | POA: Insufficient documentation

## 2013-12-04 DIAGNOSIS — M1712 Unilateral primary osteoarthritis, left knee: Secondary | ICD-10-CM

## 2013-12-04 DIAGNOSIS — M25562 Pain in left knee: Secondary | ICD-10-CM

## 2013-12-04 DIAGNOSIS — M549 Dorsalgia, unspecified: Secondary | ICD-10-CM | POA: Diagnosis not present

## 2013-12-04 DIAGNOSIS — I10 Essential (primary) hypertension: Secondary | ICD-10-CM | POA: Diagnosis not present

## 2013-12-04 DIAGNOSIS — M25569 Pain in unspecified knee: Secondary | ICD-10-CM | POA: Insufficient documentation

## 2013-12-04 DIAGNOSIS — I252 Old myocardial infarction: Secondary | ICD-10-CM | POA: Diagnosis not present

## 2013-12-04 DIAGNOSIS — Z862 Personal history of diseases of the blood and blood-forming organs and certain disorders involving the immune mechanism: Secondary | ICD-10-CM | POA: Diagnosis not present

## 2013-12-04 DIAGNOSIS — Z7982 Long term (current) use of aspirin: Secondary | ICD-10-CM | POA: Insufficient documentation

## 2013-12-04 DIAGNOSIS — I509 Heart failure, unspecified: Secondary | ICD-10-CM | POA: Diagnosis not present

## 2013-12-04 DIAGNOSIS — Z79899 Other long term (current) drug therapy: Secondary | ICD-10-CM | POA: Insufficient documentation

## 2013-12-04 DIAGNOSIS — J441 Chronic obstructive pulmonary disease with (acute) exacerbation: Secondary | ICD-10-CM | POA: Diagnosis not present

## 2013-12-04 HISTORY — DX: Anemia, unspecified: D64.9

## 2013-12-04 MED ORDER — ACETAMINOPHEN 500 MG PO TABS
1000.0000 mg | ORAL_TABLET | Freq: Once | ORAL | Status: AC
  Administered 2013-12-04: 1000 mg via ORAL
  Filled 2013-12-04: qty 2

## 2013-12-04 MED ORDER — HYDROCODONE-ACETAMINOPHEN 5-325 MG PO TABS: 1.0000 | ORAL_TABLET | ORAL | Status: DC | PRN

## 2013-12-04 MED ORDER — DEXAMETHASONE SODIUM PHOSPHATE 4 MG/ML IJ SOLN
8.0000 mg | Freq: Once | INTRAMUSCULAR | Status: AC
  Administered 2013-12-04: 8 mg via INTRAMUSCULAR
  Filled 2013-12-04: qty 2

## 2013-12-04 MED ORDER — KETOROLAC TROMETHAMINE 10 MG PO TABS
10.0000 mg | ORAL_TABLET | Freq: Once | ORAL | Status: AC
  Administered 2013-12-04: 10 mg via ORAL
  Filled 2013-12-04: qty 1

## 2013-12-04 MED ORDER — CELECOXIB 200 MG PO CAPS: 200.0000 mg | ORAL_CAPSULE | Freq: Two times a day (BID) | ORAL | Status: DC

## 2013-12-04 NOTE — ED Notes (Signed)
nad noted prior to dc. Dc instructions reviewed and explained. Voiced understanding. 2 scripts given.  

## 2013-12-04 NOTE — ED Notes (Signed)
Left knee pain and swelling x 1 1/2 wks. Denies injury.

## 2013-12-04 NOTE — ED Provider Notes (Signed)
CSN: [id number]     Arrival date & time 12/04/13  1610 History   First MD Initiated Contact with Patient 12/04/13 1621     Chief Complaint  Patient presents with  . Knee Pain     (Consider location/radiation/quality/duration/timing/severity/associated sxs/prior Treatment) HPI Comments: The patient is a 53 year old female who presents to the emergency department with a complaint of left knee pain. The patient states that for the past one to one and a half weeks she's been having increasing pain and swelling involving the left knee. She has been diagnosed with arthritis involving several areas, but particularly in the knee area. She's not had any recent fall, accident, injury. His been no high fevers reported. The patient was evaluated by her primary physician approximately 2 weeks ago and was treated with a steroid injection. The patient states this helped some but did not completely take the pain away. The patient presents to the emergency department because the pain is getting worse. The patient has an appointment in another 2 weeks, but states she does not feel she can take the pain for that long.  Patient is a 53 y.o. female presenting with knee pain. The history is provided by the patient.  Knee Pain Associated symptoms: back pain   Associated symptoms: no neck pain     Past Medical History  Diagnosis Date  . COPD (chronic obstructive pulmonary disease)   . Hypertension   . Myocardial infarct   . CHF (congestive heart failure)   . Anemia    Past Surgical History  Procedure Laterality Date  . Cesarean section     Family History  Problem Relation Age of Onset  . Heart attack Mother   . Diabetes Mother   . Diabetes Father   . Suicidality Sister    History  Substance Use Topics  . Smoking status: Former Smoker -- 1.00 packs/day for 20 years    Quit date: 05/31/2008  . Smokeless tobacco: Not on file  . Alcohol Use: No   OB History   Grav Para Term Preterm Abortions TAB SAB  Ect Mult Living                 Review of Systems  Constitutional: Negative for activity change.       All ROS Neg except as noted in HPI  HENT: Negative for nosebleeds.   Eyes: Negative for photophobia and discharge.  Respiratory: Positive for cough and shortness of breath. Negative for wheezing.   Cardiovascular: Negative for chest pain and palpitations.  Gastrointestinal: Negative for abdominal pain and blood in stool.  Genitourinary: Negative for dysuria, frequency and hematuria.  Musculoskeletal: Positive for arthralgias, back pain and joint swelling. Negative for neck pain.  Skin: Negative.   Neurological: Negative for dizziness, seizures and speech difficulty.  Psychiatric/Behavioral: Negative for hallucinations and confusion.      Allergies  Bee venom and Tdap  Home Medications   Prior to Admission medications   Medication Sig Start Date End Date Taking? Authorizing Provider  ALPRAZolam Prudy Feeler) 1 MG tablet Take 1 mg by mouth at bedtime as needed for anxiety or sleep. Anxiety 05/03/13   Historical Provider, MD  aspirin EC 81 MG EC tablet Take 1 tablet (81 mg total) by mouth daily. 06/03/13   Lonia Blood, MD  atorvastatin (LIPITOR) 20 MG tablet Take 20 mg by mouth every evening.  03/04/13   Historical Provider, MD  cyclobenzaprine (FLEXERIL) 10 MG tablet Take 10 mg by mouth daily as needed for  muscle spasms.  07/19/13   Historical Provider, MD  diphenhydrAMINE (BENADRYL) 25 MG tablet Take 50 mg by mouth 2 (two) times daily as needed for itching or allergies.    Historical Provider, MD  doxycycline (VIBRAMYCIN) 100 MG capsule Take 1 capsule (100 mg total) by mouth 2 (two) times daily. 09/11/13   Kathie Dike, PA-C  famotidine (PEPCID) 20 MG tablet Take 1 tablet (20 mg total) by mouth 2 (two) times daily. 07/22/13   Ward Givens, MD  fluticasone (CUTIVATE) 0.05 % cream Apply 1 application topically 2 (two) times a week.  06/24/13   Historical Provider, MD  furosemide (LASIX)  20 MG tablet Take 20 mg by mouth every evening.  07/30/13   Historical Provider, MD  gabapentin (NEURONTIN) 100 MG capsule Take 100 mg by mouth 3 (three) times daily. 05/25/13   Historical Provider, MD  HYDROcodone-acetaminophen (NORCO) 10-325 MG per tablet Take 1 tablet by mouth 4 (four) times daily. pain 05/03/13   Historical Provider, MD  levalbuterol [facility] HFA) 45 MCG/ACT inhaler Inhale 2 puffs into the lungs every 6 (six) hours as needed for wheezing. 06/03/13   Lonia Blood, MD  metoprolol tartrate (LOPRESSOR) 25 MG tablet Take 0.5 tablets (12.5 mg total) by mouth 2 (two) times daily. 06/03/13   Lonia Blood, MD  pantoprazole (PROTONIX) 40 MG tablet Take 40 mg by mouth 2 (two) times daily. 05/25/13   Historical Provider, MD   BP 120/79  Pulse 77  Temp(Src) 99.4 F (37.4 C) (Oral)  Resp 16  Ht  (1.549 m)  Wt 215 lb (97.523 kg)  BMI 40.64 kg/m2  SpO2 100%  LMP 11/19/2013 Physical Exam  Nursing note and vitals reviewed. Constitutional: She is oriented to person, place, and time. She appears well-developed and well-nourished.  Non-toxic appearance.  HENT:  Head: Normocephalic.  Right Ear: Tympanic membrane and external ear normal.  Left Ear: Tympanic membrane and external ear normal.  Eyes: EOM and lids are normal. Pupils are equal, round, and reactive to light.  Neck: Normal range of motion. Neck supple. Carotid bruit is not present.  Cardiovascular: Normal rate, regular rhythm, normal heart sounds, intact distal pulses and normal pulses.   Pulmonary/Chest: Breath sounds normal. No respiratory distress.  Abdominal: Soft. Bowel sounds are normal. There is no tenderness. There is no guarding.  Musculoskeletal: Normal range of motion.  There is fair range of motion of the right and left hip. There is poor range of motion of the left knee. There is increased warmth of the left knee. There is degenerative deformity involving the left knee. There is no posterior mass appreciated,  but there is some soreness of the posterior portion of the knee. There is pain with any attempted flexion and extension of the knee. Patient would not allow for full examination. The dorsalis pedis pulse on the left is 2+ as well as on the right. The capillary refill is less than 2 seconds bilaterally.  Lymphadenopathy:       Head (right side): No submandibular adenopathy present.       Head (left side): No submandibular adenopathy present.    She has no cervical adenopathy.  Neurological: She is alert and oriented to person, place, and time. She has normal strength. No cranial nerve deficit or sensory deficit.  Skin: Skin is warm and dry.  Psychiatric: She has a normal mood and affect. Her speech is normal.    ED Course  Procedures (including critical care time) Labs  Review Labs Reviewed - No data to display  Imaging Review No results found.   EKG Interpretation None      MDM No hot joint or high fever or tachycardia. Doubt septic joint. No recent injury or trauma. Doubt fracture, examination does not endorse dislocation. Examination is consistent with exacerbation of degenerative joint disease involving the knee. The patient states she had some results from steroid injection. Patient will be treated with dexamethasone. Patient will also be given a short course of Celebrex and Norco. Patient is to follow up with her primary physician in one to 2 weeks.    Final diagnoses:  None    **I have reviewed nursing notes, vital signs, and all appropriate lab and imaging results for this patient.Kathie Dike, PA-C 12/04/13 [phone]

## 2013-12-04 NOTE — ED Notes (Signed)
Pt requesting knee immobilizer. Per H. Beverely Pace okay.

## 2013-12-04 NOTE — Discharge Instructions (Signed)
Knee Pain °The knee is the complex joint between your thigh and your lower leg. It is made up of bones, tendons, ligaments, and cartilage. The bones that make up the knee are: °· The femur in the thigh. °· The tibia and fibula in the lower leg. °· The patella or kneecap riding in the groove on the lower femur. °CAUSES  °Knee pain is a common complaint with many causes. A few of these causes are: °· Injury, such as: °· A ruptured ligament or tendon injury. °· Torn cartilage. °· Medical conditions, such as: °· Gout °· Arthritis °· Infections °· Overuse, over training, or overdoing a physical activity. °Knee pain can be minor or severe. Knee pain can accompany debilitating injury. Minor knee problems often respond well to self-care measures or get well on their own. More serious injuries may need medical intervention or even surgery. °SYMPTOMS °The knee is complex. Symptoms of knee problems can vary widely. Some of the problems are: °· Pain with movement and weight bearing. °· Swelling and tenderness. °· Buckling of the knee. °· Inability to straighten or extend your knee. °· Your knee locks and you cannot straighten it. °· Warmth and redness with pain and fever. °· Deformity or dislocation of the kneecap. °DIAGNOSIS  °Determining what is wrong may be very straight forward such as when there is an injury. It can also be challenging because of the complexity of the knee. Tests to make a diagnosis may include: °· Your caregiver taking a history and doing a physical exam. °· Routine X-rays can be used to rule out other problems. X-rays will not reveal a cartilage tear. Some injuries of the knee can be diagnosed by: °· Arthroscopy a surgical technique by which a small video camera is inserted through tiny incisions on the sides of the knee. This procedure is used to examine and repair internal knee joint problems. Tiny instruments can be used during arthroscopy to repair the torn knee cartilage (meniscus). °· Arthrography  is a radiology technique. A contrast liquid is directly injected into the knee joint. Internal structures of the knee joint then become visible on X-ray film. °· An MRI scan is a non X-ray radiology procedure in which magnetic fields and a computer produce two- or three-dimensional images of the inside of the knee. Cartilage tears are often visible using an MRI scanner. MRI scans have largely replaced arthrography in diagnosing cartilage tears of the knee. °· Blood work. °· Examination of the fluid that helps to lubricate the knee joint (synovial fluid). This is done by taking a sample out using a needle and a syringe. °TREATMENT °The treatment of knee problems depends on the cause. Some of these treatments are: °· Depending on the injury, proper casting, splinting, surgery, or physical therapy care will be needed. °· Give yourself adequate recovery time. Do not overuse your joints. If you begin to get sore during workout routines, back off. Slow down or do fewer repetitions. °· For repetitive activities such as cycling or running, maintain your strength and nutrition. °· Alternate muscle groups. For example, if you are a weight lifter, work the upper body on one day and the lower body the next. °· Either tight or weak muscles do not give the proper support for your knee. Tight or weak muscles do not absorb the stress placed on the knee joint. Keep the muscles surrounding the knee strong. °· Take care of mechanical problems. °¨ If you have flat feet, orthotics or special shoes may help.   See your caregiver if you need help. °¨ Arch supports, sometimes with wedges on the inner or outer aspect of the heel, can help. These can shift pressure away from the side of the knee most bothered by osteoarthritis. °¨ A brace called an "unloader" brace also may be used to help ease the pressure on the most arthritic side of the knee. °· If your caregiver has prescribed crutches, braces, wraps or ice, use as directed. The acronym  for this is PRICE. This means protection, rest, ice, compression, and elevation. °· Nonsteroidal anti-inflammatory drugs (NSAIDs), can help relieve pain. But if taken immediately after an injury, they may actually increase swelling. Take NSAIDs with food in your stomach. Stop them if you develop stomach problems. Do not take these if you have a history of ulcers, stomach pain, or bleeding from the bowel. Do not take without your caregiver's approval if you have problems with fluid retention, heart failure, or kidney problems. °· For ongoing knee problems, physical therapy may be helpful. °· Glucosamine and chondroitin are over-the-counter dietary supplements. Both may help relieve the pain of osteoarthritis in the knee. These medicines are different from the usual anti-inflammatory drugs. Glucosamine may decrease the rate of cartilage destruction. °· Injections of a corticosteroid drug into your knee joint may help reduce the symptoms of an arthritis flare-up. They may provide pain relief that lasts a few months. You may have to wait a few months between injections. The injections do have a small increased risk of infection, water retention, and elevated blood sugar levels. °· Hyaluronic acid injected into damaged joints may ease pain and provide lubrication. These injections may work by reducing inflammation. A series of shots may give relief for as long as 6 months. °· Topical painkillers. Applying certain ointments to your skin may help relieve the pain and stiffness of osteoarthritis. Ask your pharmacist for suggestions. Many over the-counter products are approved for temporary relief of arthritis pain. °· In some countries, doctors often prescribe topical NSAIDs for relief of chronic conditions such as arthritis and tendinitis. A review of treatment with NSAID creams found that they worked as well as oral medications but without the serious side effects. °PREVENTION °· Maintain a healthy weight. Extra pounds  put more strain on your joints. °· Get strong, stay limber. Weak muscles are a common cause of knee injuries. Stretching is important. Include flexibility exercises in your workouts. °· Be smart about exercise. If you have osteoarthritis, chronic knee pain or recurring injuries, you may need to change the way you exercise. This does not mean you have to stop being active. If your knees ache after jogging or playing basketball, consider switching to swimming, water aerobics, or other low-impact activities, at least for a few days a week. Sometimes limiting high-impact activities will provide relief. °· Make sure your shoes fit well. Choose footwear that is right for your sport. °· Protect your knees. Use the proper gear for knee-sensitive activities. Use kneepads when playing volleyball or laying carpet. Buckle your seat belt every time you drive. Most shattered kneecaps occur in car accidents. °· Rest when you are tired. °SEEK MEDICAL CARE IF:  °You have knee pain that is continual and does not seem to be getting better.  °SEEK IMMEDIATE MEDICAL CARE IF:  °Your knee joint feels hot to the touch and you have a high fever. °MAKE SURE YOU:  °· Understand these instructions. °· Will watch your condition. °· Will get help right away if you are not   doing well or get worse. °Document Released: 01/16/2007 Document Revised: 06/13/2011 Document Reviewed: 01/16/2007 °[employer]® Patient Information ©2015 [employer]. This information is not intended to replace advice given to you by your health care provider. Make sure you discuss any questions you have with your health care provider. ° °Arthritis, Nonspecific °Arthritis is inflammation of a joint. This usually means pain, redness, warmth or swelling are present. One or more joints may be involved. There are a number of types of arthritis. Your caregiver may not be able to tell what type of arthritis you have right away. °CAUSES  °The most common cause of arthritis is the wear  and tear on the joint (osteoarthritis). This causes damage to the cartilage, which can break down over time. The knees, hips, back and neck are most often affected by this type of arthritis. °Other types of arthritis and common causes of joint pain include: °· Sprains and other injuries near the joint. Sometimes minor sprains and injuries cause pain and swelling that develop hours later. °· Rheumatoid arthritis. This affects hands, feet and knees. It usually affects both sides of your body at the same time. It is often associated with chronic ailments, fever, weight loss and general weakness. °· Crystal arthritis. Gout and pseudo gout can cause occasional acute severe pain, redness and swelling in the foot, ankle, or knee. °· Infectious arthritis. Bacteria can get into a joint through a break in overlying skin. This can cause infection of the joint. Bacteria and viruses can also spread through the blood and affect your joints. °· Drug, infectious and allergy reactions. Sometimes joints can become mildly painful and slightly swollen with these types of illnesses. °SYMPTOMS  °· Pain is the main symptom. °· Your joint or joints can also be red, swollen and warm or hot to the touch. °· You may have a fever with certain types of arthritis, or even feel overall ill. °· The joint with arthritis will hurt with movement. Stiffness is present with some types of arthritis. °DIAGNOSIS  °Your caregiver will suspect arthritis based on your description of your symptoms and on your exam. Testing may be needed to find the type of arthritis: °· Blood and sometimes urine tests. °· X-ray tests and sometimes CT or MRI scans. °· Removal of fluid from the joint (arthrocentesis) is done to check for bacteria, crystals or other causes. Your caregiver (or a specialist) will numb the area over the joint with a local anesthetic, and use a needle to remove joint fluid for examination. This procedure is only minimally uncomfortable. °· Even with  these tests, your caregiver may not be able to tell what kind of arthritis you have. Consultation with a specialist (rheumatologist) may be helpful. °TREATMENT  °Your caregiver will discuss with you treatment specific to your type of arthritis. If the specific type cannot be determined, then the following general recommendations may apply. °Treatment of severe joint pain includes: °· Rest. °· Elevation. °· Anti-inflammatory medication (for example, ibuprofen) may be prescribed. Avoiding activities that cause increased pain. °· Only take over-the-counter or prescription medicines for pain and discomfort as recommended by your caregiver. °· Cold packs over an inflamed joint may be used for 10 to 15 minutes every hour. Hot packs sometimes feel better, but do not use overnight. Do not use hot packs if you are diabetic without your caregiver's permission. °· A cortisone shot into arthritic joints may help reduce pain and swelling. °· Any acute arthritis that gets worse over the next 1   to 2 days needs to be looked at to be sure there is no joint infection. °Long-term arthritis treatment involves modifying activities and lifestyle to reduce joint stress jarring. This can include weight loss. Also, exercise is needed to nourish the joint cartilage and remove waste. This helps keep the muscles around the joint strong. °HOME CARE INSTRUCTIONS  °· Do not take aspirin to relieve pain if gout is suspected. This elevates uric acid levels. °· Only take over-the-counter or prescription medicines for pain, discomfort or fever as directed by your caregiver. °· Rest the joint as much as possible. °· If your joint is swollen, keep it elevated. °· Use crutches if the painful joint is in your leg. °· Drinking plenty of fluids may help for certain types of arthritis. °· Follow your caregiver's dietary instructions. °· Try low-impact exercise such as: °¨ Swimming. °¨ Water aerobics. °¨ Biking. °¨ Walking. °· Morning stiffness is often  relieved by a warm shower. °· Put your joints through regular range-of-motion. °SEEK MEDICAL CARE IF:  °· You do not feel better in 24 hours or are getting worse. °· You have side effects to medications, or are not getting better with treatment. °SEEK IMMEDIATE MEDICAL CARE IF:  °· You have a fever. °· You develop severe joint pain, swelling or redness. °· Many joints are involved and become painful and swollen. °· There is severe back pain and/or leg weakness. °· You have loss of bowel or bladder control. °Document Released: 04/28/2004 Document Revised: 06/13/2011 Document Reviewed: 05/14/2008 °ExitCare® Patient Information ©2015 [employer]. This information is not intended to replace advice given to you by your health care provider. Make sure you discuss any questions you have with your health care provider. ° °

## 2013-12-05 NOTE — ED Provider Notes (Signed)
Medical screening examination/treatment/procedure(s) were performed by non-physician practitioner and as supervising physician I was immediately available for consultation/collaboration.   EKG Interpretation None        Adilyn Humes, MD 12/05/13 0023 

## 2013-12-23 ENCOUNTER — Telehealth: Payer: Self-pay

## 2013-12-23 NOTE — Telephone Encounter (Signed)
PATIENT CALLED STATING SHE NEEDED TO SCHEDULE A COLONOSCOPY AT [facility].

## 2013-12-23 NOTE — Telephone Encounter (Signed)
I called pt and she is having a lot of problems. Acid reflux, burning sensation, bloating, constipation and she is anemic. OV with Gerrit Halls, NP on 01/20/2014 at 2:00 PM. She is requesting Dr. Jena Gauss to do her procedure and I am routing to [facility] to nic that.

## 2013-12-23 NOTE — Telephone Encounter (Signed)
PUT APPOINTMENT NOTE IN THAT PATIENT WANTS [facility] FOR COLONOSCOPY

## 2014-01-20 ENCOUNTER — Encounter (INDEPENDENT_AMBULATORY_CARE_PROVIDER_SITE_OTHER): Payer: Self-pay

## 2014-01-20 ENCOUNTER — Encounter: Payer: Self-pay | Admitting: Gastroenterology

## 2014-01-20 ENCOUNTER — Other Ambulatory Visit: Payer: Self-pay

## 2014-01-20 ENCOUNTER — Ambulatory Visit (INDEPENDENT_AMBULATORY_CARE_PROVIDER_SITE_OTHER): Payer: BC Managed Care – PPO | Admitting: Gastroenterology

## 2014-01-20 VITALS — BP 116/76 | HR 78 | Temp 99.2°F | Resp 18 | Ht 61.0 in | Wt 214.0 lb

## 2014-01-20 DIAGNOSIS — Z1211 Encounter for screening for malignant neoplasm of colon: Secondary | ICD-10-CM

## 2014-01-20 DIAGNOSIS — K219 Gastro-esophageal reflux disease without esophagitis: Secondary | ICD-10-CM

## 2014-01-20 DIAGNOSIS — R1013 Epigastric pain: Secondary | ICD-10-CM

## 2014-01-20 DIAGNOSIS — K5900 Constipation, unspecified: Secondary | ICD-10-CM

## 2014-01-20 MED ORDER — LINACLOTIDE 145 MCG PO CAPS: 145.0000 ug | ORAL_CAPSULE | Freq: Every day | ORAL | Status: DC

## 2014-01-20 MED ORDER — PEG 3350-KCL-NA BICARB-NACL 420 G PO SOLR: 4000.0000 mL | ORAL | Status: DC

## 2014-01-20 NOTE — Progress Notes (Signed)
Primary Care Physician:  Cassell SmilesFUSCO,LAWRENCE J., MD Primary Gastroenterologist:  Dr. Jena Gaussourk  Chief Complaint  Patient presents with  . Colonoscopy  . Constipation  . Gastrophageal Reflux    HPI:   Gina Bradley presents today at the request of Dr. Sherwood GamblerFusco secondary to screening colonoscopy.   Feels like there is a knot in upper abdomen, just to right of epigastric region. Associated with eating. Lots of burning in esophagus. Protonix BID without much relief. Significant reflux at night. No NSAIDs. Baby aspirin once daily. No dysphagia. Lots of belching and burning. States she was placed on iron and had to have blood work for several months. Appears Hgb in July was 11.5, which is down slightly from a year prior at 12.3.   Trying to eat healthier. Low-volume hematochezia in the past with constipation. Tries to have BM every 2-3 days. Eats salads to help. Tried to take dulcolax but had cramping/abdominal pain with this. Wants to try correctol. Constipation since MI in Feb 2015.   Past Medical History  Diagnosis Date  . COPD (chronic obstructive pulmonary disease)   . Hypertension   . Myocardial infarct   . CHF (congestive heart failure)   . Anemia   . SVT (supraventricular tachycardia)   . Peripheral neuropathy     Past Surgical History  Procedure Laterality Date  . Cesarean section      Current Outpatient Prescriptions  Medication Sig Dispense Refill  . ALPRAZolam (XANAX) 1 MG tablet Take 1 mg by mouth at bedtime as needed for anxiety or sleep. Anxiety      . aspirin EC 81 MG EC tablet Take 1 tablet (81 mg total) by mouth daily.      [name]. atorvastatin (LIPITOR) 20 MG tablet Take 20 mg by mouth every evening.       . cyclobenzaprine (FLEXERIL) 10 MG tablet Take 10 mg by mouth daily as needed for muscle spasms.       . diphenhydrAMINE (BENADRYL) 25 MG tablet Take 50 mg by mouth 2 (two) times daily as needed for itching or allergies.      . fluticasone (CUTIVATE) 0.05 % cream  Apply 1 application topically 2 (two) times a week.       . furosemide (LASIX) 20 MG tablet Take 20 mg by mouth every evening.       . gabapentin (NEURONTIN) 100 MG capsule Take 100 mg by mouth 3 (three) times daily.      [name]. HYDROcodone-acetaminophen (NORCO) 10-325 MG per tablet Take 1 tablet by mouth 4 (four) times daily. pain      . HYDROcodone-acetaminophen (NORCO) 5-325 MG per tablet Take 1 tablet by mouth every 4 (four) hours as needed for moderate pain.  16 tablet  0  . levalbuterol (XOPENEX HFA) 45 MCG/ACT inhaler Inhale 2 puffs into the lungs every 6 (six) hours as needed for wheezing.  1 Inhaler  0  . metoprolol tartrate (LOPRESSOR) 25 MG tablet Take 0.5 tablets (12.5 mg total) by mouth 2 (two) times daily.  30 tablet  0  . pantoprazole (PROTONIX) 40 MG tablet Take 40 mg by mouth 2 (two) times daily.       No current facility-administered medications for this visit.    Allergies as of 01/20/2014 - Review Complete 01/20/2014  Allergen Reaction Noted  . Bee venom  12/04/2013  . Tdap [diphth-acell pertussis-tetanus]  12/04/2013    Family History  Problem Relation Age of Onset  . Heart attack Mother   .  Diabetes Mother   . Diabetes Father   . Suicidality Sister   . Colon cancer      possibly Dad     History   Social History  . Marital Status: Married    Spouse Name: N/A    Number of Children: N/A  . Years of Education: N/A   Occupational History  . [employer]    Social History Main Topics  . Smoking status: Former Smoker -- 1.00 packs/day for 20 years    Quit date: 05/31/2008  . Smokeless tobacco: Not on file  . Alcohol Use: No  . Drug Use: No  . Sexual Activity: Not on file   Other Topics Concern  . Not on file   Social History Narrative  . No narrative on file    Review of Systems: Gen: Denies any fever, chills, fatigue, weight loss, lack of appetite.  CV: +palpitations Resp: +DOE GI: see HPI GU : Denies urinary burning, urinary frequency, urinary  hesitancy MS: knee/feet pain Derm: Denies rash, itching, dry skin Psych: Denies depression, anxiety, memory loss, and confusion Heme: Denies bruising, bleeding, and enlarged lymph nodes.  Physical Exam: BP 116/76  Pulse 78  Temp(Src) 99.2 F (37.3 C) (Oral)  Resp 18  Ht 5\' 1"  (1.549 m)  Wt 214 lb (97.07 kg)  BMI 40.46 kg/m2 General:   Alert and oriented. Pleasant and cooperative. Well-nourished and well-developed.  Head:  Normocephalic and atraumatic. Eyes:  Without icterus, sclera clear and conjunctiva pink.  Ears:  Normal auditory acuity. Nose:  No deformity, discharge,  or lesions. Mouth:  No deformity or lesions, oral mucosa pink. Poor dentition.  Lungs:  Clear to auscultation bilaterally. No wheezes, rales, or rhonchi. No distress.  Heart:  S1, S2 present without murmurs appreciated.  Abdomen:  +BS, soft, mild TTP epigastric region and non-distended. No HSM noted. No guarding or rebound. No masses appreciated.  Rectal:  Deferred  Msk:  Symmetrical without gross deformities. Normal posture. Extremities:  Without clubbing or edema. Neurologic:  Alert and  oriented x4;  grossly normal neurologically. Skin:  Intact without significant lesions or rashes. Psych:  Alert and cooperative. Normal mood and affect.   US abdomen May 2015:  Gallbladder appears normal

## 2014-01-20 NOTE — Patient Instructions (Signed)
For constipation: start taking Linzess 1 capsule each morning, 30 minutes before breakfast. This may cause loose stool initially, but it should get better. If not, please call us.   We have scheduled you for a colonoscopy and upper endoscopy with Dr. Jena Gaussourk in the near future.  We may need to do further evaluation of your gallbladder if test findings are inconclusive.

## 2014-01-22 DIAGNOSIS — Z1211 Encounter for screening for malignant neoplasm of colon: Secondary | ICD-10-CM | POA: Insufficient documentation

## 2014-01-22 NOTE — Assessment & Plan Note (Signed)
Continue Protonix BID. May need trial of Dexilant.

## 2014-01-22 NOTE — Assessment & Plan Note (Signed)
Start Linzess 145 mcg daily. Voucher provided.  

## 2014-01-22 NOTE — Assessment & Plan Note (Signed)
53 year old female due for initial screening colonoscopy, with constipation since Feb 2015. Low-volume hematochezia likely benign in setting of constipation. Poor historian, with unclear family history regarding colon cancer; patient believes her father "may have had" colon cancer. Regardless, will need to proceed with initial lower GI evaluation for screening purposes.   Proceed with TCS with Dr. Jena Gaussourk in near future: the risks, benefits, and alternatives have been discussed with the patient in detail. The patient states understanding and desires to proceed. Phenergan 25 mg IV on call due to polypharmacy

## 2014-01-22 NOTE — Assessment & Plan Note (Signed)
Epigastric discomfort with eating in the setting of GERD refractory to BID PPi therapy. Denies NSAIDs, takes baby aspirin daily. Gallbladder remains in situ with normal ultrasound. Recommend EGD to evaluate for gastritis, PUD. HIDA scan recommended if negative EGD.   Proceed with upper endoscopy in the near future with Dr. Jena Gaussourk. The risks, benefits, and alternatives have been discussed in detail with patient. They have stated understanding and desire to proceed.  Phenergan 25 mg IV on call due to polypharmacy Continue Protonix BID until EGD completed.

## 2014-01-24 NOTE — Progress Notes (Signed)
cc'ed to pcp °

## 2014-01-28 ENCOUNTER — Encounter (HOSPITAL_COMMUNITY)
Admission: RE | Admit: 2014-01-28 | Discharge: 2014-01-28 | Disposition: A | Discharge status: Home / Self Care | Payer: BC Managed Care – PPO | Source: Ambulatory Visit | Attending: "Endocrinology | Admitting: "Endocrinology

## 2014-01-28 DIAGNOSIS — E2740 Unspecified adrenocortical insufficiency: Secondary | ICD-10-CM | POA: Insufficient documentation

## 2014-01-28 MED ORDER — COSYNTROPIN 0.25 MG IJ SOLR
INTRAMUSCULAR | Status: AC
  Filled 2014-01-28: qty 0.25

## 2014-01-28 MED ORDER — COSYNTROPIN 0.25 MG IJ SOLR
0.2500 mg | Freq: Once | INTRAMUSCULAR | Status: AC
  Administered 2014-01-28: 0.25 mg via INTRAVENOUS

## 2014-01-28 MED ORDER — SODIUM CHLORIDE 0.9 % IJ SOLN: 10.0000 mL | INTRAMUSCULAR | Status: DC | PRN

## 2014-01-28 NOTE — Progress Notes (Signed)
Here for ACTH testing. Dx adrenal insufficiency.

## 2014-01-29 LAB — ACTH STIMULATION, 3 TIME POINTS
Cortisol, 30 Min: 21.2 ug/dL (ref >20.0)
Cortisol, 60 Min: 25.2 ug/dL (ref >20)
Cortisol, Base: 20.1 ug/dL

## 2014-01-29 NOTE — Progress Notes (Signed)
Results for Beaulah DinningBARNES, Abigayl J (MRN [id number]) as of 01/29/2014 14:21 Results from the lab work from 01/28/2014   Ref. Range 01/28/2014 12:21  Cortisol, Base No range found 20.1  Cortisol, 30 Min Latest Range: >20.0 ug/dL 40.921.2  Cortisol, 60 Min Latest Range: >20 ug/dL 25.2

## 2014-02-06 ENCOUNTER — Other Ambulatory Visit: Payer: Self-pay

## 2014-02-06 ENCOUNTER — Telehealth: Payer: Self-pay | Admitting: Internal Medicine

## 2014-02-06 NOTE — Telephone Encounter (Signed)
Pt called today saying that she is scheduled for TCS/EGD on Monday and wants to cancel the TCS, but still do the EGD. Any questions she can be reached at [phone]

## 2014-02-06 NOTE — Telephone Encounter (Signed)
Please advise if this will be ok for her to do.

## 2014-02-07 ENCOUNTER — Telehealth: Payer: Self-pay | Admitting: *Deleted

## 2014-02-07 NOTE — Telephone Encounter (Signed)
Pt is scheduled to have a endo and colonoscopy done on Monday 02/10/14 and they would like her cleared before she has it done Dr Benard Rinkroark office will be doing it.

## 2014-02-07 NOTE — Telephone Encounter (Signed)
Tried to call with no answer  

## 2014-02-07 NOTE — Telephone Encounter (Signed)
Will forward to Dr. Taylor.  

## 2014-02-07 NOTE — Telephone Encounter (Signed)
Is there a reason she does not want to proceed with the colonoscopy? She has never had one, and there has been some question as to if her father had colon cancer or not. It would be recommended to continue with the colonoscopy to screen for colon cancer. However, it is ultimately up to her. Let me know what she decides.

## 2014-02-07 NOTE — OR Nursing (Signed)
Called and spoke with patient and she has decided to proceed with colonoscopy as well as the EGD.

## 2014-02-07 NOTE — Telephone Encounter (Signed)
Noted  

## 2014-02-07 NOTE — Telephone Encounter (Signed)
Shawna OrleansMelanie called and said the patient called them and said she would go ahead with both now on Monday.

## 2014-02-10 ENCOUNTER — Ambulatory Visit (HOSPITAL_COMMUNITY)
Admission: RE | Admit: 2014-02-10 | Discharge: 2014-02-10 | Disposition: A | Discharge status: Home / Self Care | Payer: BC Managed Care – PPO | Source: Ambulatory Visit | Attending: Internal Medicine | Admitting: Internal Medicine

## 2014-02-10 ENCOUNTER — Encounter (HOSPITAL_COMMUNITY)
Admission: RE | Disposition: A | Discharge status: Home / Self Care | Payer: Self-pay | Source: Ambulatory Visit | Attending: Internal Medicine

## 2014-02-10 ENCOUNTER — Encounter (HOSPITAL_COMMUNITY): Payer: Self-pay

## 2014-02-10 ENCOUNTER — Other Ambulatory Visit: Payer: Self-pay | Admitting: Internal Medicine

## 2014-02-10 DIAGNOSIS — I10 Essential (primary) hypertension: Secondary | ICD-10-CM | POA: Insufficient documentation

## 2014-02-10 DIAGNOSIS — J449 Chronic obstructive pulmonary disease, unspecified: Secondary | ICD-10-CM | POA: Diagnosis not present

## 2014-02-10 DIAGNOSIS — R1013 Epigastric pain: Secondary | ICD-10-CM

## 2014-02-10 DIAGNOSIS — K219 Gastro-esophageal reflux disease without esophagitis: Secondary | ICD-10-CM | POA: Insufficient documentation

## 2014-02-10 DIAGNOSIS — R1011 Right upper quadrant pain: Secondary | ICD-10-CM

## 2014-02-10 DIAGNOSIS — K257 Chronic gastric ulcer without hemorrhage or perforation: Secondary | ICD-10-CM

## 2014-02-10 DIAGNOSIS — Z79899 Other long term (current) drug therapy: Secondary | ICD-10-CM | POA: Diagnosis not present

## 2014-02-10 DIAGNOSIS — Z7982 Long term (current) use of aspirin: Secondary | ICD-10-CM | POA: Diagnosis not present

## 2014-02-10 DIAGNOSIS — K259 Gastric ulcer, unspecified as acute or chronic, without hemorrhage or perforation: Secondary | ICD-10-CM | POA: Insufficient documentation

## 2014-02-10 DIAGNOSIS — Z1211 Encounter for screening for malignant neoplasm of colon: Secondary | ICD-10-CM

## 2014-02-10 DIAGNOSIS — D649 Anemia, unspecified: Secondary | ICD-10-CM | POA: Diagnosis not present

## 2014-02-10 DIAGNOSIS — K30 Functional dyspepsia: Secondary | ICD-10-CM

## 2014-02-10 HISTORY — PX: ESOPHAGOGASTRODUODENOSCOPY: SHX5428

## 2014-02-10 SURGERY — EGD (ESOPHAGOGASTRODUODENOSCOPY)
Anesthesia: Moderate Sedation

## 2014-02-10 MED ORDER — ONDANSETRON HCL 4 MG/2ML IJ SOLN
INTRAMUSCULAR | Status: AC
  Filled 2014-02-10: qty 2

## 2014-02-10 MED ORDER — PROMETHAZINE HCL 25 MG/ML IJ SOLN
INTRAMUSCULAR | Status: AC
  Filled 2014-02-10: qty 1

## 2014-02-10 MED ORDER — SODIUM CHLORIDE 0.9 % IV SOLN
INTRAVENOUS | Status: DC
  Administered 2014-02-10: 13:00:00 via INTRAVENOUS

## 2014-02-10 MED ORDER — ONDANSETRON HCL 4 MG/2ML IJ SOLN
INTRAMUSCULAR | Status: DC | PRN
  Administered 2014-02-10: 4 mg via INTRAVENOUS

## 2014-02-10 MED ORDER — LIDOCAINE VISCOUS 2 % MT SOLN
OROMUCOSAL | Status: AC
  Filled 2014-02-10: qty 15

## 2014-02-10 MED ORDER — MEPERIDINE HCL 100 MG/ML IJ SOLN
INTRAMUSCULAR | Status: DC | PRN
  Administered 2014-02-10: 50 mg via INTRAVENOUS
  Administered 2014-02-10: 25 mg via INTRAVENOUS

## 2014-02-10 MED ORDER — STERILE WATER FOR IRRIGATION IR SOLN
Status: DC | PRN
  Administered 2014-02-10: 13:00:00

## 2014-02-10 MED ORDER — MIDAZOLAM HCL 5 MG/5ML IJ SOLN
INTRAMUSCULAR | Status: DC | PRN
  Administered 2014-02-10: 2 mg via INTRAVENOUS
  Administered 2014-02-10: 1 mg via INTRAVENOUS

## 2014-02-10 MED ORDER — PROMETHAZINE HCL 25 MG/ML IJ SOLN
25.0000 mg | Freq: Once | INTRAMUSCULAR | Status: AC
  Administered 2014-02-10: 25 mg via INTRAVENOUS

## 2014-02-10 MED ORDER — MEPERIDINE HCL 100 MG/ML IJ SOLN
INTRAMUSCULAR | Status: DC
Start: 2014-02-10 — End: 2014-02-10
  Filled 2014-02-10: qty 2

## 2014-02-10 MED ORDER — MIDAZOLAM HCL 5 MG/5ML IJ SOLN
INTRAMUSCULAR | Status: AC
  Filled 2014-02-10: qty 10

## 2014-02-10 MED ORDER — SODIUM CHLORIDE 0.9 % IJ SOLN
INTRAMUSCULAR | Status: AC
  Filled 2014-02-10: qty 10

## 2014-02-10 MED ORDER — LIDOCAINE VISCOUS 2 % MT SOLN
OROMUCOSAL | Status: DC | PRN
Start: 2014-02-10 — End: 2014-02-10
  Administered 2014-02-10: 4 mL via OROMUCOSAL

## 2014-02-10 NOTE — Interval H&P Note (Signed)
History and Physical Interval Note:  02/10/2014 1:11 PM  Gina Bradley  has presented today for surgery, with the diagnosis of dyspepsia, screening  The various methods of treatment have been discussed with the patient and family. After consideration of risks, benefits and other options for treatment, the patient has consented to  Procedure(s) with comments: ESOPHAGOGASTRODUODENOSCOPY (EGD) (N/A) - 1245 - moved to 1:00 - Ginger to notify pt COLONOSCOPY (N/A) as a surgical intervention .  The patient's history has been reviewed, patient examined, no change in status, stable for surgery.  I have reviewed the patient's chart and labs.  Questions were answered to the patient's satisfaction.     Gina Bradley  Patient decided not have a colonoscopy right now. Today, we were doing the EGD only. Otherwise, no change.The risks, benefits, limitations, alternatives and imponderables have been reviewed with the patient. Potential for esophageal dilation, biopsy, etc. have also been reviewed.  Questions have been answered. All parties agreeable.

## 2014-02-10 NOTE — Op Note (Signed)
[facility] [address] [place], [id number]   ENDOSCOPY PROCEDURE REPORT  PATIENT: Gina DinningBarnes, Krysti J  MR#: [id number] BIRTHDATE: 09/22/1960 , 53  yrs. old GENDER: female ENDOSCOPIST: R.  Roetta SessionsMichael Rourk, MD FACP FACG REFERRED BY:  Artis DelayLarry Fusco, M.D. PROCEDURE DATE:  02/10/2014 PROCEDURE:  EGD, diagnostic INDICATIONS:  dyspepsia; right upper quadrant abdominal pain. MEDICATIONS: Versed 3 mg IV and Demerol 75 mg IV in divided doses. Phenergan 25 mg IV.  Zofran 4 mg IV.  Xylocaine gel orally ASA CLASS:      Class III  CONSENT: The risks, benefits, limitations, alternatives and imponderables have been discussed.  The potential for biopsy, esophogeal dilation, etc. have also been reviewed.  Questions have been answered.  All parties agreeable.  Please see the history and physical in the medical record for more information.  DESCRIPTION OF PROCEDURE: After the risks benefits and alternatives of the procedure were thoroughly explained, informed consent was obtained.  The [id number] ([id number] endoscope was introduced through the mouth and advanced to the second portion of the duodenum , limited by Without limitations. The instrument was slowly withdrawn as the mucosa was fully examined.    Normal-appearing esophagus.  Stomach emptying.  Normal gastric mucosa aside from a single tiny antral erosion.  No ulcer or infiltrating process.  Patent pylorus.  Normal first and second portion of the duodenum.  Retroflexed views revealed as previously described.     The scope was then withdrawn from the patient and the procedure completed.  COMPLICATIONS: There were no immediate complications.  ENDOSCOPIC IMPRESSION: Single tiny antral erosion; otherwise normal EGD. No explanation for patient's symptoms. Gallbladder needs further evaluation.  RECOMMENDATIONS: Schedule HIDA with [facility]. Patient, at the last minute, decided not have a colonoscopy. I strongly urged her to reconsider  with her symptoms of bleeding and no prior colonoscopy. We'll schedule follow-up appointment in our office in 6 weeks to discuss.  REPEAT EXAM:  eSigned:  R. Roetta SessionsMichael Rourk, MD Jerrel IvoryFACP [facility] System[facility]ACG 02/10/2014 1:36 PM    CC:  CPT CODES: ICD CODES:  The ICD and CPT codes recommended by this software are interpretations from the data that the clinical staff has captured with the software.  The verification of the translation of this report to the ICD and CPT codes and modifiers is the sole responsibility of the health care institution and practicing physician where this report was generated.  [employer] will not be held responsible for the validity of the ICD and CPT codes included on this report.  AMA assumes no liability for data contained or not contained herein. CPT is a Publishing rights managerregistered trademark of the [employer].

## 2014-02-10 NOTE — Discharge Instructions (Addendum)
EGD Discharge instructions Please read the instructions outlined below and refer to this sheet in the next few weeks. These discharge instructions provide you with general information on caring for yourself after you leave the hospital. Your doctor may also give you specific instructions. While your treatment has been planned according to the most current medical practices available, unavoidable complications occasionally occur. If you have any problems or questions after discharge, please call your doctor. ACTIVITY  You may resume your regular activity but move at a slower pace for the next 24 hours.   Take frequent rest periods for the next 24 hours.   Walking will help expel (get rid of) the air and reduce the bloated feeling in your abdomen.   No driving for 24 hours (because of the anesthesia (medicine) used during the test).   You may shower.   Do not sign any important legal documents or operate any machinery for 24 hours (because of the anesthesia used during the test).  NUTRITION  Drink plenty of fluids.   You may resume your normal diet.   Begin with a light meal and progress to your normal diet.   Avoid alcoholic beverages for 24 hours or as instructed by your caregiver.  MEDICATIONS  You may resume your normal medications unless your caregiver tells you otherwise.  WHAT YOU CAN EXPECT TODAY  You may experience abdominal discomfort such as a feeling of fullness or gas pains.  FOLLOW-UP  Your doctor will discuss the results of your test with you.  SEEK IMMEDIATE MEDICAL ATTENTION IF ANY OF THE FOLLOWING OCCUR:  Excessive nausea (feeling sick to your stomach) and/or vomiting.   Severe abdominal pain and distention (swelling).   Trouble swallowing.   Temperature over 101 F (37.8 C).   Rectal bleeding or vomiting of blood.    Schedule HIDA with [facility] to further evaluate right upper quadrant abdominal pain  Continue linzess daily  Office visit in 6 weeks to  discuss rescheduling colonoscopy

## 2014-02-10 NOTE — H&P (View-Only) (Signed)
Primary Care Physician:  Cassell SmilesFUSCO,LAWRENCE J., MD Primary Gastroenterologist:  Dr. Jena Gaussourk  Chief Complaint  Patient presents with  . Colonoscopy  . Constipation  . Gastrophageal Reflux    HPI:   Gina Bradley presents today at the request of Dr. Sherwood GamblerFusco secondary to screening colonoscopy.   Feels like there is a knot in upper abdomen, just to right of epigastric region. Associated with eating. Lots of burning in esophagus. Protonix BID without much relief. Significant reflux at night. No NSAIDs. Baby aspirin once daily. No dysphagia. Lots of belching and burning. States she was placed on iron and had to have blood work for several months. Appears Hgb in July was 11.5, which is down slightly from a year prior at 12.3.   Trying to eat healthier. Low-volume hematochezia in the past with constipation. Tries to have BM every 2-3 days. Eats salads to help. Tried to take dulcolax but had cramping/abdominal pain with this. Wants to try correctol. Constipation since MI in Feb 2015.   Past Medical History  Diagnosis Date  . COPD (chronic obstructive pulmonary disease)   . Hypertension   . Myocardial infarct   . CHF (congestive heart failure)   . Anemia   . SVT (supraventricular tachycardia)   . Peripheral neuropathy     Past Surgical History  Procedure Laterality Date  . Cesarean section      Current Outpatient Prescriptions  Medication Sig Dispense Refill  . ALPRAZolam (XANAX) 1 MG tablet Take 1 mg by mouth at bedtime as needed for anxiety or sleep. Anxiety      . aspirin EC 81 MG EC tablet Take 1 tablet (81 mg total) by mouth daily.      [name]. atorvastatin (LIPITOR) 20 MG tablet Take 20 mg by mouth every evening.       . cyclobenzaprine (FLEXERIL) 10 MG tablet Take 10 mg by mouth daily as needed for muscle spasms.       . diphenhydrAMINE (BENADRYL) 25 MG tablet Take 50 mg by mouth 2 (two) times daily as needed for itching or allergies.      . fluticasone (CUTIVATE) 0.05 % cream  Apply 1 application topically 2 (two) times a week.       . furosemide (LASIX) 20 MG tablet Take 20 mg by mouth every evening.       . gabapentin (NEURONTIN) 100 MG capsule Take 100 mg by mouth 3 (three) times daily.      [name]. HYDROcodone-acetaminophen (NORCO) 10-325 MG per tablet Take 1 tablet by mouth 4 (four) times daily. pain      . HYDROcodone-acetaminophen (NORCO) 5-325 MG per tablet Take 1 tablet by mouth every 4 (four) hours as needed for moderate pain.  16 tablet  0  . levalbuterol (XOPENEX HFA) 45 MCG/ACT inhaler Inhale 2 puffs into the lungs every 6 (six) hours as needed for wheezing.  1 Inhaler  0  . metoprolol tartrate (LOPRESSOR) 25 MG tablet Take 0.5 tablets (12.5 mg total) by mouth 2 (two) times daily.  30 tablet  0  . pantoprazole (PROTONIX) 40 MG tablet Take 40 mg by mouth 2 (two) times daily.       No current facility-administered medications for this visit.    Allergies as of 01/20/2014 - Review Complete 01/20/2014  Allergen Reaction Noted  . Bee venom  12/04/2013  . Tdap [diphth-acell pertussis-tetanus]  12/04/2013    Family History  Problem Relation Age of Onset  . Heart attack Mother   .  Diabetes Mother   . Diabetes Father   . Suicidality Sister   . Colon cancer      possibly Dad     History   Social History  . Marital Status: Married    Spouse Name: N/A    Number of Children: N/A  . Years of Education: N/A   Occupational History  . [employer]    Social History Main Topics  . Smoking status: Former Smoker -- 1.00 packs/day for 20 years    Quit date: 05/31/2008  . Smokeless tobacco: Not on file  . Alcohol Use: No  . Drug Use: No  . Sexual Activity: Not on file   Other Topics Concern  . Not on file   Social History Narrative  . No narrative on file    Review of Systems: Gen: Denies any fever, chills, fatigue, weight loss, lack of appetite.  CV: +palpitations Resp: +DOE GI: see HPI GU : Denies urinary burning, urinary frequency, urinary  hesitancy MS: knee/feet pain Derm: Denies rash, itching, dry skin Psych: Denies depression, anxiety, memory loss, and confusion Heme: Denies bruising, bleeding, and enlarged lymph nodes.  Physical Exam: BP 116/76  Pulse 78  Temp(Src) 99.2 F (37.3 C) (Oral)  Resp 18  Ht 5\' 1"  (1.549 m)  Wt 214 lb (97.07 kg)  BMI 40.46 kg/m2 General:   Alert and oriented. Pleasant and cooperative. Well-nourished and well-developed.  Head:  Normocephalic and atraumatic. Eyes:  Without icterus, sclera clear and conjunctiva pink.  Ears:  Normal auditory acuity. Nose:  No deformity, discharge,  or lesions. Mouth:  No deformity or lesions, oral mucosa pink. Poor dentition.  Lungs:  Clear to auscultation bilaterally. No wheezes, rales, or rhonchi. No distress.  Heart:  S1, S2 present without murmurs appreciated.  Abdomen:  +BS, soft, mild TTP epigastric region and non-distended. No HSM noted. No guarding or rebound. No masses appreciated.  Rectal:  Deferred  Msk:  Symmetrical without gross deformities. Normal posture. Extremities:  Without clubbing or edema. Neurologic:  Alert and  oriented x4;  grossly normal neurologically. Skin:  Intact without significant lesions or rashes. Psych:  Alert and cooperative. Normal mood and affect.   US abdomen May 2015:  Gallbladder appears normal

## 2014-02-12 ENCOUNTER — Encounter (HOSPITAL_COMMUNITY): Payer: Self-pay | Admitting: Internal Medicine

## 2014-02-13 ENCOUNTER — Encounter (HOSPITAL_COMMUNITY): Payer: Self-pay

## 2014-02-13 ENCOUNTER — Ambulatory Visit (HOSPITAL_COMMUNITY)
Admission: RE | Admit: 2014-02-13 | Discharge: 2014-02-13 | Disposition: A | Discharge status: Home / Self Care | Payer: BC Managed Care – PPO | Source: Ambulatory Visit | Attending: Internal Medicine | Admitting: Internal Medicine

## 2014-02-13 DIAGNOSIS — R140 Abdominal distension (gaseous): Secondary | ICD-10-CM | POA: Diagnosis not present

## 2014-02-13 DIAGNOSIS — R1011 Right upper quadrant pain: Secondary | ICD-10-CM

## 2014-02-13 MED ORDER — SINCALIDE 5 MCG IJ SOLR
INTRAMUSCULAR | Status: AC
  Administered 2014-02-13: 1.95 ug
  Filled 2014-02-13: qty 5

## 2014-02-13 MED ORDER — STERILE WATER FOR INJECTION IJ SOLN
INTRAMUSCULAR | Status: AC
  Administered 2014-02-13: 1.95 mL
  Filled 2014-02-13: qty 10

## 2014-02-13 MED ORDER — TECHNETIUM TC 99M MEBROFENIN IV KIT
5.0000 | PACK | Freq: Once | INTRAVENOUS | Status: AC | PRN
  Administered 2014-02-13: 5 via INTRAVENOUS

## 2014-02-21 ENCOUNTER — Encounter: Payer: Self-pay | Admitting: Internal Medicine

## 2014-02-21 ENCOUNTER — Ambulatory Visit (INDEPENDENT_AMBULATORY_CARE_PROVIDER_SITE_OTHER): Payer: BC Managed Care – PPO | Admitting: Internal Medicine

## 2014-02-21 VITALS — BP 120/62 | HR 89 | Ht 61.0 in | Wt 217.0 lb

## 2014-02-21 DIAGNOSIS — I10 Essential (primary) hypertension: Secondary | ICD-10-CM

## 2014-02-21 DIAGNOSIS — I471 Supraventricular tachycardia, unspecified: Secondary | ICD-10-CM

## 2014-02-21 DIAGNOSIS — J410 Simple chronic bronchitis: Secondary | ICD-10-CM

## 2014-02-21 NOTE — Progress Notes (Signed)
HPI Ms. Gina Bradley returns today for ongoing evaluation of SVT. She is a pleasant middle aged woman with a h/o tachypalpitations and documented SVT when she presented with incessant SVT and ruled in for a NSTEMI. She elected not to undergo catheter ablation at that time several months ago. She has had some recurrent symptoms but she has also had some GI difficulties and may ultimately require Cholecystectomy. Her biggest complaint involves stress with her husband for whom she is in the process of separating from.   Allergies  Allergen Reactions  . Bee Venom   . Tdap [Diphth-Acell Pertussis-Tetanus]      Current Outpatient Prescriptions  Medication Sig Dispense Refill  . ALPRAZolam (XANAX) 1 MG tablet Take 1 mg by mouth at bedtime as needed for anxiety or sleep. Anxiety    . aspirin EC 81 MG EC tablet Take 1 tablet (81 mg total) by mouth daily.    [name]. atorvastatin (LIPITOR) 20 MG tablet Take 20 mg by mouth every evening.     . cyclobenzaprine (FLEXERIL) 10 MG tablet Take 10 mg by mouth daily as needed for muscle spasms.     . diphenhydrAMINE (BENADRYL) 25 MG tablet Take 50 mg by mouth 2 (two) times daily as needed for itching or allergies.    . fluticasone (CUTIVATE) 0.05 % cream Apply 1 application topically 2 (two) times a week.     . furosemide (LASIX) 20 MG tablet Take 20 mg by mouth every evening.     . gabapentin (NEURONTIN) 100 MG capsule Take 100 mg by mouth 3 (three) times daily.    [name]. HYDROcodone-acetaminophen (NORCO) 10-325 MG per tablet Take 1 tablet by mouth 4 (four) times daily. pain    . levalbuterol (XOPENEX HFA) 45 MCG/ACT inhaler Inhale 2 puffs into the lungs every 6 (six) hours as needed for wheezing. 1 Inhaler 0  . Linaclotide (LINZESS) 145 MCG CAPS capsule Take 1 capsule (145 mcg total) by mouth daily. 30 minutes before breakfast daily 30 capsule 5  . metoprolol tartrate (LOPRESSOR) 25 MG tablet Take 0.5 tablets (12.5 mg total) by mouth 2 (two) times daily. 30 tablet 0    . pantoprazole (PROTONIX) 40 MG tablet Take 40 mg by mouth 2 (two) times daily.    . polyethylene glycol-electrolytes (TRILYTE) 420 G solution Take 4,000 mLs by mouth as directed. 4000 mL 0   No current facility-administered medications for this visit.     Past Medical History  Diagnosis Date  . COPD (chronic obstructive pulmonary disease)   . Hypertension   . Myocardial infarct   . CHF (congestive heart failure)   . Anemia   . SVT (supraventricular tachycardia)   . Peripheral neuropathy     ROS:   All systems reviewed and negative except as noted in the HPI.   Past Surgical History  Procedure Laterality Date  . Cesarean section    . Esophagogastroduodenoscopy N/A 02/10/2014    Procedure: ESOPHAGOGASTRODUODENOSCOPY (EGD);  Surgeon: Corbin Adeobert M Rourk, MD;  Location: [facility];  Service: Endoscopy;  Laterality: N/A;  1245 - moved to 1:00 - Ginger to notify pt     Family History  Problem Relation Age of Onset  . Heart attack Mother   . Diabetes Mother   . Diabetes Father   . Suicidality Sister   . Colon cancer      possibly Dad      History   Social History  . Marital Status: Married    Spouse  Name: N/A    Number of Children: N/A  . Years of Education: N/A   Occupational History  . [employer]    Social History Main Topics  . Smoking status: Former Smoker -- 1.00 packs/day for 20 years    Quit date: 05/31/2008  . Smokeless tobacco: Not on file  . Alcohol Use: No  . Drug Use: No  . Sexual Activity: Not on file   Other Topics Concern  . Not on file   Social History Narrative     BP 120/62 mmHg  Pulse 89  Ht 5\' 1"  (1.549 m)  Wt 217 lb (98.431 kg)  BMI 41.02 kg/m2  SpO2 97%  LMP 02/03/2014  Physical Exam:  obese appearing middle aged woman, NAD HEENT: Unremarkable Neck:  No JVD, no thyromegally Back:  No CVA tenderness Lungs:  Clear with no wheezes HEART:  Regular rate rhythm, no murmurs, no rubs, no clicks Abd:  soft, obese, positive  bowel sounds, no organomegally, no rebound, no guarding Ext:  2 plus pulses, no edema, no cyanosis, no clubbing Skin:  No rashes no nodules Neuro:  CN II through XII intact, motor grossly intact    Assess/Plan:

## 2014-02-23 ENCOUNTER — Encounter: Payer: Self-pay | Admitting: Internal Medicine

## 2014-02-23 NOTE — Assessment & Plan Note (Signed)
Her blood pressure is well controlled on medical therapy. No change. She will maintain a low sodium diet.

## 2014-02-23 NOTE — Assessment & Plan Note (Signed)
I have discussed the treatment options with the patient. At this point she feels she is not symptomatic enough to recommend catheter ablation. She will undergo watchful waiting.

## 2014-02-23 NOTE — Assessment & Plan Note (Signed)
No cough. Her symptoms are well controlled. Continue current meds.

## 2014-02-26 NOTE — Addendum Note (Signed)
Addended by: Burman NievesASHWORTH, Baneen Wieseler T on: 02/26/2014 03:47 PM   Modules accepted: Level of Service

## 2014-02-26 NOTE — Patient Instructions (Addendum)
Your physician wants you to follow-up in: 6 MONTHS WITH DR Ladona RidgelAYLOR UNLESS YOUR HEART RATE WORSENS. You will receive a reminder letter in the mail two months in advance. If you don't receive a letter, please call our office to schedule the follow-up appointment.    Your physician recommends that you continue on your current medications as directed. Please refer to the Current Medication list given to you today.  Thank you for choosing [facility]!!

## 2014-03-10 ENCOUNTER — Telehealth: Payer: Self-pay | Admitting: Internal Medicine

## 2014-03-10 NOTE — Telephone Encounter (Signed)
Patient called to check on her results she had done on her gall bladder. Please advise [phone]

## 2014-03-12 NOTE — Telephone Encounter (Signed)
Tried to call pt this morning. LMOM to return call

## 2014-03-13 ENCOUNTER — Encounter (HOSPITAL_COMMUNITY): Payer: Self-pay | Admitting: Interventional Cardiology

## 2014-03-24 ENCOUNTER — Telehealth: Payer: Self-pay | Admitting: Internal Medicine

## 2014-03-24 ENCOUNTER — Encounter: Payer: Self-pay | Admitting: Gastroenterology

## 2014-03-24 ENCOUNTER — Ambulatory Visit: Payer: BC Managed Care – PPO | Admitting: Gastroenterology

## 2014-03-24 NOTE — Telephone Encounter (Signed)
PATIENT WAS A NO SHOW 03/24/14 AND LETTER SENT °

## 2014-10-30 ENCOUNTER — Encounter: Payer: Self-pay | Admitting: *Deleted

## 2014-11-19 ENCOUNTER — Telehealth: Payer: Self-pay | Admitting: Internal Medicine

## 2014-11-19 NOTE — Telephone Encounter (Signed)
Patient wants to know about her diagnosis' / tg

## 2014-11-19 NOTE — Telephone Encounter (Signed)
Pt set up overdue apt with Dr Ladona Ridgel.She needs information to assist her appeal her denial of disability

## 2014-12-19 ENCOUNTER — Telehealth: Payer: Self-pay | Admitting: *Deleted

## 2014-12-19 NOTE — Telephone Encounter (Signed)
Patient c/o Chest Pain, SOB, back pain, and Lt arm burning. Patient states that her sx started 3 days ago but are increasing getting worse. She states that her chest pain is the worse in the morning with she wakes. Patient reports that her home pulse ox is giving her a reading between 89% and 96%, and that she is unable to lay flat to sleep. Patient was encouraged to be evaluated in the ED.

## 2014-12-22 ENCOUNTER — Emergency Department (HOSPITAL_COMMUNITY)
Admission: EM | Admit: 2014-12-22 | Discharge: 2014-12-22 | Disposition: A | Discharge status: Home / Self Care | Payer: 59 | Attending: Emergency Medicine | Admitting: Emergency Medicine

## 2014-12-22 ENCOUNTER — Telehealth: Payer: Self-pay | Admitting: Internal Medicine

## 2014-12-22 ENCOUNTER — Encounter (HOSPITAL_COMMUNITY): Payer: Self-pay | Admitting: Emergency Medicine

## 2014-12-22 ENCOUNTER — Emergency Department (HOSPITAL_COMMUNITY): Payer: 59

## 2014-12-22 DIAGNOSIS — I10 Essential (primary) hypertension: Secondary | ICD-10-CM | POA: Diagnosis not present

## 2014-12-22 DIAGNOSIS — Z8739 Personal history of other diseases of the musculoskeletal system and connective tissue: Secondary | ICD-10-CM | POA: Insufficient documentation

## 2014-12-22 DIAGNOSIS — I509 Heart failure, unspecified: Secondary | ICD-10-CM | POA: Insufficient documentation

## 2014-12-22 DIAGNOSIS — I252 Old myocardial infarction: Secondary | ICD-10-CM | POA: Insufficient documentation

## 2014-12-22 DIAGNOSIS — F419 Anxiety disorder, unspecified: Secondary | ICD-10-CM | POA: Diagnosis not present

## 2014-12-22 DIAGNOSIS — G629 Polyneuropathy, unspecified: Secondary | ICD-10-CM | POA: Insufficient documentation

## 2014-12-22 DIAGNOSIS — K219 Gastro-esophageal reflux disease without esophagitis: Secondary | ICD-10-CM | POA: Insufficient documentation

## 2014-12-22 DIAGNOSIS — Z9889 Other specified postprocedural states: Secondary | ICD-10-CM | POA: Insufficient documentation

## 2014-12-22 DIAGNOSIS — I2510 Atherosclerotic heart disease of native coronary artery without angina pectoris: Secondary | ICD-10-CM | POA: Insufficient documentation

## 2014-12-22 DIAGNOSIS — Z7982 Long term (current) use of aspirin: Secondary | ICD-10-CM | POA: Insufficient documentation

## 2014-12-22 DIAGNOSIS — Z862 Personal history of diseases of the blood and blood-forming organs and certain disorders involving the immune mechanism: Secondary | ICD-10-CM | POA: Insufficient documentation

## 2014-12-22 DIAGNOSIS — Z79899 Other long term (current) drug therapy: Secondary | ICD-10-CM | POA: Diagnosis not present

## 2014-12-22 DIAGNOSIS — J441 Chronic obstructive pulmonary disease with (acute) exacerbation: Secondary | ICD-10-CM | POA: Diagnosis not present

## 2014-12-22 DIAGNOSIS — Z87891 Personal history of nicotine dependence: Secondary | ICD-10-CM | POA: Insufficient documentation

## 2014-12-22 DIAGNOSIS — R079 Chest pain, unspecified: Secondary | ICD-10-CM | POA: Diagnosis present

## 2014-12-22 LAB — BASIC METABOLIC PANEL
Anion gap: 8 (ref 5–15)
BUN: 8 mg/dL (ref 6–20)
CO2: 27 mmol/L (ref 22–32)
Calcium: 8.3 mg/dL — ABNORMAL LOW (ref 8.9–10.3)
Chloride: 102 mmol/L (ref 101–111)
Creatinine, Ser: 0.68 mg/dL (ref 0.44–1.00)
GFR calc Af Amer: >60 mL/min (ref >60)
GFR calc non Af Amer: >60 mL/min (ref >60)
Glucose, Bld: 96 mg/dL (ref 65–99)
Potassium: 3.3 mmol/L — ABNORMAL LOW (ref 3.5–5.1)
Sodium: 137 mmol/L (ref 135–145)

## 2014-12-22 LAB — CBC WITH DIFFERENTIAL/PLATELET
Basophils Absolute: 0 10*3/uL (ref 0.0–0.1)
Basophils Relative: 0 %
Eosinophils Absolute: 0.4 10*3/uL (ref 0.0–0.7)
Eosinophils Relative: 4 %
HCT: 35.8 % — ABNORMAL LOW (ref 36.0–46.0)
Hemoglobin: 12.2 g/dL (ref 12.0–15.0)
Lymphocytes Relative: 28 %
Lymphs Abs: 2.5 10*3/uL (ref 0.7–4.0)
MCH: 32.3 pg (ref 26.0–34.0)
MCHC: 34.1 g/dL (ref 30.0–36.0)
MCV: 94.7 fL (ref 78.0–100.0)
Monocytes Absolute: 0.6 10*3/uL (ref 0.1–1.0)
Monocytes Relative: 7 %
Neutro Abs: 5.5 10*3/uL (ref 1.7–7.7)
Neutrophils Relative %: 61 %
Platelets: 239 10*3/uL (ref 150–400)
RBC: 3.78 MIL/uL — ABNORMAL LOW (ref 3.87–5.11)
RDW: 12.9 % (ref 11.5–15.5)
WBC: 9.1 10*3/uL (ref 4.0–10.5)

## 2014-12-22 LAB — TROPONIN I: Troponin I: <0.03 ng/mL (ref <0.031)

## 2014-12-22 MED ORDER — NITROGLYCERIN 0.4 MG SL SUBL: 0.4000 mg | SUBLINGUAL_TABLET | SUBLINGUAL | Status: AC | PRN

## 2014-12-22 MED ORDER — ASPIRIN 81 MG PO CHEW
324.0000 mg | CHEWABLE_TABLET | Freq: Once | ORAL | Status: AC
  Administered 2014-12-22: 324 mg via ORAL
  Filled 2014-12-22: qty 4

## 2014-12-22 MED ORDER — NITROGLYCERIN 0.4 MG SL SUBL
0.4000 mg | SUBLINGUAL_TABLET | SUBLINGUAL | Status: DC | PRN
Start: 2014-12-22 — End: 2014-12-22
  Administered 2014-12-22 (×2): 0.4 mg via SUBLINGUAL
  Filled 2014-12-22: qty 1

## 2014-12-22 NOTE — Discharge Instructions (Signed)
You are leaving against the advice of Dr. Fayrene Fearing who is recommended to stay overnight for further testing and monitoring.  Chest Pain (Nonspecific) It is often hard to give a specific diagnosis for the cause of chest pain. There is always a chance that your pain could be related to something serious, such as a heart attack or a blood clot in the lungs. You need to follow up with your health care provider for further evaluation. CAUSES   Heartburn.  Pneumonia or bronchitis.  Anxiety or stress.  Inflammation around your heart (pericarditis) or lung (pleuritis or pleurisy).  A blood clot in the lung.  A collapsed lung (pneumothorax). It can develop suddenly on its own (spontaneous pneumothorax) or from trauma to the chest.  Shingles infection (herpes zoster virus). The chest wall is composed of bones, muscles, and cartilage. Any of these can be the source of the pain.  The bones can be bruised by injury.  The muscles or cartilage can be strained by coughing or overwork.  The cartilage can be affected by inflammation and become sore (costochondritis). DIAGNOSIS  Lab tests or other studies may be needed to find the cause of your pain. Your health care provider may have you take a test called an ambulatory electrocardiogram (ECG). An ECG records your heartbeat patterns over a 24-hour period. You may also have other tests, such as:  Transthoracic echocardiogram (TTE). During echocardiography, sound waves are used to evaluate how blood flows through your heart.  Transesophageal echocardiogram (TEE).  Cardiac monitoring. This allows your health care provider to monitor your heart rate and rhythm in real time.  Holter monitor. This is a portable device that records your heartbeat and can help diagnose heart arrhythmias. It allows your health care provider to track your heart activity for several days, if needed.  Stress tests by exercise or by giving medicine that makes the heart beat  faster. TREATMENT   Treatment depends on what may be causing your chest pain. Treatment may include:  Acid blockers for heartburn.  Anti-inflammatory medicine.  Pain medicine for inflammatory conditions.  Antibiotics if an infection is present.  You may be advised to change lifestyle habits. This includes stopping smoking and avoiding alcohol, caffeine, and chocolate.  You may be advised to keep your head raised (elevated) when sleeping. This reduces the chance of acid going backward from your stomach into your esophagus. Most of the time, nonspecific chest pain will improve within 2-3 days with rest and mild pain medicine.  HOME CARE INSTRUCTIONS   If antibiotics were prescribed, take them as directed. Finish them even if you start to feel better.  For the next few days, avoid physical activities that bring on chest pain. Continue physical activities as directed.  Do not use any tobacco products, including cigarettes, chewing tobacco, or electronic cigarettes.  Avoid drinking alcohol.  Only take medicine as directed by your health care provider.  Follow your health care provider's suggestions for further testing if your chest pain does not go away.  Keep any follow-up appointments you made. If you do not go to an appointment, you could develop lasting (chronic) problems with pain. If there is any problem keeping an appointment, call to reschedule. SEEK MEDICAL CARE IF:   Your chest pain does not go away, even after treatment.  You have a rash with blisters on your chest.  You have a fever. SEEK IMMEDIATE MEDICAL CARE IF:   You have increased chest pain or pain that spreads to your  arm, neck, jaw, back, or abdomen.  You have shortness of breath.  You have an increasing cough, or you cough up blood.  You have severe back or abdominal pain.  You feel nauseous or vomit.  You have severe weakness.  You faint.  You have chills. This is an emergency. Do not wait to  see if the pain will go away. Get medical help at once. Call your local emergency services (911 in [place] Do not drive yourself to the hospital. MAKE SURE YOU:   Understand these instructions.  Will watch your condition.  Will get help right away if you are not doing well or get worse. Document Released: 12/29/2004 Document Revised: 03/26/2013 Document Reviewed: 10/25/2007 [facility] Patient Information 2015 [place], [place]. This information is not intended to replace advice given to you by your health care provider. Make sure you discuss any questions you have with your health care provider.

## 2014-12-22 NOTE — ED Provider Notes (Signed)
CSN: [id number]     Arrival date & time 12/22/14  1631 History   First MD Initiated Contact with Patient 12/22/14 1654     Chief Complaint  Patient presents with  . Chest Pain      HPI  Patient returns for evaluation of chest pain persists in having intermittently over the last 3-4 days almost all day today. Occasionally it headed and short of breath. History of SVT. Encouraged to have an ablation but declined. Had normal nonocclusive heart cath in 2015 with Dr. Verdis Prime.  Past Medical History  Diagnosis Date  . COPD (chronic obstructive pulmonary disease)   . Hypertension   . Myocardial infarct   . CHF (congestive heart failure)   . Anemia   . SVT (supraventricular tachycardia)   . Peripheral neuropathy   . Lung disease   . Visual disorder   . GERD (gastroesophageal reflux disease)   . Coronary artery disease   . Cervical disc disease   . Spinal stenosis    Past Surgical History  Procedure Laterality Date  . Cesarean section    . Esophagogastroduodenoscopy N/A 02/10/2014    WUJ:[id number] tiny antral erosion; otherwise normal EGD. No explanation for patient's symptoms. Gallbladder needs further evaluation.  . Left heart catheterization with coronary angiogram N/A 06/03/2013    Procedure: LEFT HEART CATHETERIZATION WITH CORONARY ANGIOGRAM;  Surgeon: Lesleigh Noe, MD;  Location: [facility] CATH LAB;  Service: Cardiovascular;  Laterality: N/A;   Family History  Problem Relation Age of Onset  . Heart attack Mother   . Diabetes Mother   . Hypertension Mother   . Angina Mother   . Diabetes Father   . Hypertension Father   . Suicidality Sister   . Colon cancer      possibly Dad   . Heart failure Brother   . Stroke Maternal Grandmother   . Heart failure Maternal Grandmother   . Diabetes Maternal Grandfather    Social History  Substance Use Topics  . Smoking status: Former Smoker -- 1.00 packs/day for 20 years    Quit date: 05/31/2008  . Smokeless tobacco: None  . Alcohol  Use: No   OB History    No data available     Review of Systems  Constitutional: Negative for fever, chills, diaphoresis, appetite change and fatigue.  HENT: Negative for mouth sores, sore throat and trouble swallowing.   Eyes: Negative for visual disturbance.  Respiratory: Positive for chest tightness and shortness of breath. Negative for cough and wheezing.   Cardiovascular: Positive for chest pain.  Gastrointestinal: Negative for nausea, vomiting, abdominal pain, diarrhea and abdominal distention.  Endocrine: Negative for polydipsia, polyphagia and polyuria.  Genitourinary: Negative for dysuria, frequency and hematuria.  Musculoskeletal: Negative for gait problem.  Skin: Negative for color change, pallor and rash.  Neurological: Positive for light-headedness. Negative for dizziness, syncope and headaches.  Hematological: Does not bruise/bleed easily.  Psychiatric/Behavioral: Negative for behavioral problems and confusion.      Allergies  Bee venom and Tdap  Home Medications   Prior to Admission medications   Medication Sig Start Date End Date Taking? Authorizing Provider  ALPRAZolam Prudy Feeler) 1 MG tablet Take 1 mg by mouth at bedtime as needed for anxiety or sleep. Anxiety 05/03/13   Historical Provider, MD  aspirin EC 81 MG EC tablet Take 1 tablet (81 mg total) by mouth daily. 06/03/13   Lonia Blood, MD  atorvastatin (LIPITOR) 20 MG tablet Take 20 mg by mouth every evening.  03/04/13   Historical Provider, MD  cyclobenzaprine (FLEXERIL) 10 MG tablet Take 10 mg by mouth daily as needed for muscle spasms.  07/19/13   Historical Provider, MD  diphenhydrAMINE (BENADRYL) 25 MG tablet Take 50 mg by mouth 2 (two) times daily as needed for itching or allergies.    Historical Provider, MD  fluticasone (CUTIVATE) 0.05 % cream Apply 1 application topically 2 (two) times a week.  06/24/13   Historical Provider, MD  furosemide (LASIX) 20 MG tablet Take 20 mg by mouth every evening.  07/30/13    Historical Provider, MD  gabapentin (NEURONTIN) 100 MG capsule Take 100 mg by mouth 3 (three) times daily. 05/25/13   Historical Provider, MD  HYDROcodone-acetaminophen (NORCO) 10-325 MG per tablet Take 1 tablet by mouth 4 (four) times daily. pain 05/03/13   Historical Provider, MD  levalbuterol [facility] HFA) 45 MCG/ACT inhaler Inhale 2 puffs into the lungs every 6 (six) hours as needed for wheezing. 06/03/13   Lonia Blood, MD  Linaclotide [facility]) 145 MCG CAPS capsule Take 1 capsule (145 mcg total) by mouth daily. 30 minutes before breakfast daily 01/20/14   Nira Retort, NP  metoprolol tartrate (LOPRESSOR) 25 MG tablet Take 0.5 tablets (12.5 mg total) by mouth 2 (two) times daily. 06/03/13   Lonia Blood, MD  nitroGLYCERIN (NITROSTAT) 0.4 MG SL tablet Place 1 tablet (0.4 mg total) under the tongue every 5 (five) minutes as needed for chest pain. 12/22/14   Rolland Porter, MD  pantoprazole (PROTONIX) 40 MG tablet Take 40 mg by mouth 2 (two) times daily. 05/25/13   Historical Provider, MD  polyethylene glycol-electrolytes (TRILYTE) 420 G solution Take 4,000 mLs by mouth as directed. 01/20/14   Corbin Ade, MD   BP 109/59 mmHg  Pulse 72  Temp(Src) 98.1 F (36.7 C) (Oral)  Resp 16  Ht  (1.549 m)  Wt 215 lb (97.523 kg)  BMI 40.64 kg/m2  SpO2 98%  LMP 12/15/2014 Physical Exam  Constitutional: She is oriented to person, place, and time. She appears well-developed and well-nourished. No distress.  HENT:  Head: Normocephalic.  Eyes: Conjunctivae are normal. Pupils are equal, round, and reactive to light. No scleral icterus.  Neck: Normal range of motion. Neck supple. No thyromegaly present.  Cardiovascular: Normal rate and regular rhythm.  Exam reveals no gallop and no friction rub.   No murmur heard. Anxious.  No lower extremity edema or cording. Clear lungs. No JVD or bruits. Sinus rhythm.  Pulmonary/Chest: Effort normal and breath sounds normal. No respiratory distress. She has no  wheezes. She has no rales.  Abdominal: Soft. Bowel sounds are normal. She exhibits no distension. There is no tenderness. There is no rebound.  Musculoskeletal: Normal range of motion.  Neurological: She is alert and oriented to person, place, and time.  Skin: Skin is warm and dry. No rash noted.  Psychiatric: She has a normal mood and affect. Her behavior is normal.    ED Course  Procedures (including critical care time) Labs Review Labs Reviewed  CBC WITH DIFFERENTIAL/PLATELET - Abnormal; Notable for the following:    RBC 3.78 (*)    HCT 35.8 (*)    All other components within normal limits  BASIC METABOLIC PANEL - Abnormal; Notable for the following:    Potassium 3.3 (*)    Calcium 8.3 (*)    All other components within normal limits  TROPONIN I    Imaging Review Dg Chest Portable 1 View  12/22/2014  CLINICAL DATA:  54 year old with acute onset of mid chest pain earlier today. Current history of COPD, coronary artery disease with prior MI, and hypertension.  EXAM: PORTABLE CHEST - 1 VIEW  COMPARISON:  05/31/2013 and earlier, including CTA chest 09/05/2009.  FINDINGS: Cardiac silhouette upper normal in size for [facility] portable technique, unchanged. Prominent paracardiac fat pad on the right as noted on the prior CT. Lungs clear. Bronchovascular markings normal. Pulmonary vascularity normal. No visible pleural effusions. No pneumothorax.  IMPRESSION: No acute cardiopulmonary disease.   Electronically Signed   By: Hulan Saas M.D.   On: 12/22/2014 17:36   I have personally reviewed and evaluated these images and lab results as part of my medical decision-making.   EKG Interpretation   Date/Time:  Monday December 22 2014 16:37:54 EDT Ventricular Rate:  97 PR Interval:  166 QRS Duration: 72 QT Interval:  359 QTC Calculation: 456 R Axis:   72 Text Interpretation:  Sinus rhythm Low voltage, precordial leads  Borderline T abnormalities, anterior leads Baseline wander in lead(s)  II  III aVR aVF V2 V4 V5 V6 Confirmed by ZACKOWSKI  MD, SCOTT ([id number]) on  12/22/2014 4:39:55 PM      MDM   Final diagnoses:  Chest pain, unspecified chest pain type    Almost even before I could complete her evaluation patient states "well I can't stay, I have a son at home that needs me". She has been  having pain for 3-4 days and has an unchanged EKG and normal enzymes. She will not stay for a second enzyme. She will not allow admission. She is insistent upon leaving. I did write her prescription for nitroglycerin. Of assess her to sign out AMA as I strongly recommend she stay for further evaluation. It is reassuring that she did have a normal on occlusive heart cath in 2015. I asked her return at any time with worsening of symptoms. I will also asked her to call her primary cardiology physician Dr. Ladona Ridgel for follow-up.   Rolland Porter, MD 12/22/14 2329

## 2014-12-22 NOTE — Telephone Encounter (Signed)
Pt states she is having chest pain every morning and was wondering if she could get a Rx for nitro.

## 2014-12-22 NOTE — Telephone Encounter (Signed)
Sent to Dr. Taylor.

## 2014-12-22 NOTE — ED Notes (Signed)
Pt c/o cp x 1 week, worsening today. Sob/lightheaded/back pain. Pt anxious.

## 2014-12-24 NOTE — Telephone Encounter (Signed)
Pt went to ED, received NTG rx and left AMA

## 2015-01-05 ENCOUNTER — Ambulatory Visit (INDEPENDENT_AMBULATORY_CARE_PROVIDER_SITE_OTHER): Payer: 59 | Admitting: Internal Medicine

## 2015-01-05 ENCOUNTER — Encounter: Payer: Self-pay | Admitting: Internal Medicine

## 2015-01-05 VITALS — BP 112/74 | HR 91 | Ht 61.0 in | Wt 216.0 lb

## 2015-01-05 DIAGNOSIS — I10 Essential (primary) hypertension: Secondary | ICD-10-CM | POA: Diagnosis not present

## 2015-01-05 DIAGNOSIS — R0789 Other chest pain: Secondary | ICD-10-CM | POA: Diagnosis not present

## 2015-01-05 DIAGNOSIS — I471 Supraventricular tachycardia: Secondary | ICD-10-CM | POA: Diagnosis not present

## 2015-01-05 NOTE — Assessment & Plan Note (Signed)
I suggested we undergo stress testing. She had a slight troponin elevation when she presented to the ED. I have recommended exercise treadmill testing. She will call us if she is willing to proceed.

## 2015-01-05 NOTE — Addendum Note (Signed)
Addended by: Kerney Elbe on: 01/05/2015 03:29 PM   Modules accepted: Orders

## 2015-01-05 NOTE — Assessment & Plan Note (Signed)
Her blood pressure is well controlled. Will follow.  

## 2015-01-05 NOTE — Patient Instructions (Signed)
Your physician wants you to follow-up in: 6 months with Dr. Ladona Ridgel. You will receive a reminder letter in the mail two months in advance. If you don't receive a letter, please call our office to schedule the follow-up appointment.  Your physician recommends that you continue on your current medications as directed. Please refer to the Current Medication list given to you today.  Please call the office regarding you scheduling your Stress Test.  Thank you for choosing [facility]!

## 2015-01-05 NOTE — Assessment & Plan Note (Signed)
I have offered the patient catheter ablation. She would like to wait for now. She is trying to obtain disability.

## 2015-01-05 NOTE — Progress Notes (Signed)
HPI Gina Bradley returns today for ongoing evaluation of SVT and chest pain. She is a pleasant middle aged woman with a h/o tachypalpitations and documented SVT when she presented with incessant SVT and ruled in for a NSTEMI. She elected not to undergo catheter ablation at that time several months ago. In the interim, she has had recurrent chest pain and went to the ED where she left AMA. She was given a prescription for ntg. She has had chest pressure as well. No other complaints. She is anxious about her finances.    Allergies  Allergen Reactions  . Bee Venom   . Tdap [Diphth-Acell Pertussis-Tetanus]      Current Outpatient Prescriptions  Medication Sig Dispense Refill  . ALPRAZolam (XANAX) 1 MG tablet Take 1 mg by mouth at bedtime as needed for anxiety or sleep. Anxiety    . aspirin EC 81 MG EC tablet Take 1 tablet (81 mg total) by mouth daily.    [name] atorvastatin (LIPITOR) 20 MG tablet Take 20 mg by mouth every evening.     . cyclobenzaprine (FLEXERIL) 10 MG tablet Take 10 mg by mouth daily as needed for muscle spasms.     . diphenhydrAMINE (BENADRYL) 25 MG tablet Take 50 mg by mouth 2 (two) times daily as needed for itching or allergies.    . fluticasone (CUTIVATE) 0.05 % cream Apply 1 application topically 2 (two) times a week.     . furosemide (LASIX) 20 MG tablet Take 20 mg by mouth every evening.     . gabapentin (NEURONTIN) 100 MG capsule Take 100 mg by mouth 3 (three) times daily.    [name] HYDROcodone-acetaminophen (NORCO) 10-325 MG per tablet Take 1 tablet by mouth 4 (four) times daily. pain    . levalbuterol (XOPENEX HFA) 45 MCG/ACT inhaler Inhale 2 puffs into the lungs every 6 (six) hours as needed for wheezing. 1 Inhaler 0  . Linaclotide (LINZESS) 145 MCG CAPS capsule Take 1 capsule (145 mcg total) by mouth daily. 30 minutes before breakfast daily 30 capsule 5  . metoprolol tartrate (LOPRESSOR) 25 MG tablet Take 0.5 tablets (12.5 mg total) by mouth 2 (two) times daily. 30  tablet 0  . nitroGLYCERIN (NITROSTAT) 0.4 MG SL tablet Place 1 tablet (0.4 mg total) under the tongue every 5 (five) minutes as needed for chest pain. 10 tablet 0  . pantoprazole (PROTONIX) 40 MG tablet Take 40 mg by mouth 2 (two) times daily.     No current facility-administered medications for this visit.     Past Medical History  Diagnosis Date  . COPD (chronic obstructive pulmonary disease) (HCC)   . Hypertension   . Myocardial infarct (HCC)   . CHF (congestive heart failure) (HCC)   . Anemia   . SVT (supraventricular tachycardia) (HCC)   . Peripheral neuropathy (HCC)   . Lung disease   . Visual disorder   . GERD (gastroesophageal reflux disease)   . Coronary artery disease   . Cervical disc disease   . Spinal stenosis     ROS:   All systems reviewed and negative except as noted in the HPI.   Past Surgical History  Procedure Laterality Date  . Cesarean section    . Esophagogastroduodenoscopy N/A 02/10/2014    KKX:[id number] tiny antral erosion; otherwise normal EGD. No explanation for patient's symptoms. Gallbladder needs further evaluation.  . Left heart catheterization with coronary angiogram N/A 06/03/2013    Procedure: LEFT HEART CATHETERIZATION WITH CORONARY ANGIOGRAM;  Surgeon: Lesleigh Noe, MD;  Location: [facility] CATH LAB;  Service: Cardiovascular;  Laterality: N/A;     Family History  Problem Relation Age of Onset  . Heart attack Mother   . Diabetes Mother   . Hypertension Mother   . Angina Mother   . Diabetes Father   . Hypertension Father   . Suicidality Sister   . Colon cancer      possibly Dad   . Heart failure Brother   . Stroke Maternal Grandmother   . Heart failure Maternal Grandmother   . Diabetes Maternal Grandfather      Social History   Social History  . Marital Status: Married    Spouse Name: N/A  . Number of Children: N/A  . Years of Education: N/A   Occupational History  . [employer]    Social History Main Topics  . Smoking  status: Former Smoker -- 1.00 packs/day for 20 years    Quit date: 05/31/2008  . Smokeless tobacco: Not on file  . Alcohol Use: No  . Drug Use: No  . Sexual Activity: Not on file   Other Topics Concern  . Not on file   Social History Narrative     BP 112/74 mmHg  Pulse 91  Ht  (1.549 m)  Wt 216 lb (97.977 kg)  BMI 40.83 kg/m2  SpO2 98%  LMP 12/15/2014  Physical Exam:  obese appearing middle aged woman, NAD HEENT: Unremarkable Neck:  6 cm JVD, no thyromegally Back:  No CVA tenderness Lungs:  Clear with no wheezes HEART:  Regular rate rhythm, no murmurs, no rubs, no clicks Abd:  soft, obese, positive bowel sounds, no organomegally, no rebound, no guarding Ext:  2 plus pulses, no edema, no cyanosis, no clubbing Skin:  No rashes no nodules Neuro:  CN II through XII intact, motor grossly intact    Assess/Plan:

## 2015-01-12 ENCOUNTER — Telehealth: Payer: Self-pay | Admitting: Adult Health

## 2015-01-12 NOTE — Telephone Encounter (Signed)
Please call patient in regards to multiple complaints and still having a stress test / tg

## 2015-01-12 NOTE — Telephone Encounter (Signed)
LM on answering machine to call back-cc

## 2015-01-14 ENCOUNTER — Other Ambulatory Visit: Payer: Self-pay | Admitting: "Endocrinology

## 2015-01-14 DIAGNOSIS — E2740 Unspecified adrenocortical insufficiency: Secondary | ICD-10-CM

## 2015-01-14 DIAGNOSIS — E559 Vitamin D deficiency, unspecified: Secondary | ICD-10-CM

## 2015-01-15 ENCOUNTER — Encounter (HOSPITAL_COMMUNITY): Admission: RE | Admit: 2015-01-15 | Payer: 59 | Source: Ambulatory Visit

## 2015-01-15 ENCOUNTER — Encounter (HOSPITAL_COMMUNITY): Payer: 59

## 2015-01-15 ENCOUNTER — Inpatient Hospital Stay (HOSPITAL_COMMUNITY): Admission: RE | Admit: 2015-01-15 | Payer: 59 | Source: Ambulatory Visit

## 2015-01-15 NOTE — Telephone Encounter (Signed)
Pt never returned call and she cancelled nuclear test because she has shingles

## 2015-01-23 ENCOUNTER — Encounter (HOSPITAL_COMMUNITY)
Admission: RE | Admit: 2015-01-23 | Discharge: 2015-01-23 | Disposition: A | Discharge status: Home / Self Care | Payer: 59 | Source: Ambulatory Visit | Attending: Internal Medicine | Admitting: Internal Medicine

## 2015-01-23 ENCOUNTER — Encounter (HOSPITAL_COMMUNITY): Payer: Self-pay

## 2015-01-23 ENCOUNTER — Inpatient Hospital Stay (HOSPITAL_COMMUNITY): Admission: RE | Admit: 2015-01-23 | Payer: 59 | Source: Ambulatory Visit

## 2015-01-23 DIAGNOSIS — R0789 Other chest pain: Secondary | ICD-10-CM | POA: Insufficient documentation

## 2015-01-23 LAB — NM MYOCAR MULTI W/SPECT W/WALL MOTION / EF
Estimated workload: 3.5 METS
Exercise duration (min): 0 min
Exercise duration (sec): 49 s
LV dias vol: 54 mL
LV sys vol: 10 mL
MPHR: 166 {beats}/min
Peak HR: 127 {beats}/min
Percent HR: 76 %
RATE: 0.29
RPE: 17
Rest HR: 94 {beats}/min
SDS: 3
SRS: 6
SSS: 9
TID: 1.38

## 2015-01-23 MED ORDER — SODIUM CHLORIDE 0.9 % IJ SOLN
INTRAMUSCULAR | Status: AC
  Administered 2015-01-23: 10 mL via INTRAVENOUS
  Filled 2015-01-23: qty 3

## 2015-01-23 MED ORDER — TECHNETIUM TC 99M SESTAMIBI GENERIC - CARDIOLITE
10.0000 | Freq: Once | INTRAVENOUS | Status: AC | PRN
  Administered 2015-01-23: 10.4 via INTRAVENOUS

## 2015-01-23 MED ORDER — TECHNETIUM TC 99M SESTAMIBI - CARDIOLITE
30.0000 | Freq: Once | INTRAVENOUS | Status: AC | PRN
  Administered 2015-01-23: 11:00:00 30 via INTRAVENOUS

## 2015-01-23 MED ORDER — REGADENOSON 0.4 MG/5ML IV SOLN
INTRAVENOUS | Status: AC
  Administered 2015-01-23: 0.4 mg via INTRAVENOUS
  Filled 2015-01-23: qty 5

## 2015-03-02 ENCOUNTER — Encounter (HOSPITAL_COMMUNITY): Payer: Self-pay | Admitting: Emergency Medicine

## 2015-03-02 ENCOUNTER — Emergency Department (HOSPITAL_COMMUNITY)
Admission: EM | Admit: 2015-03-02 | Discharge: 2015-03-02 | Disposition: A | Discharge status: Home / Self Care | Payer: 59 | Attending: Emergency Medicine | Admitting: Emergency Medicine

## 2015-03-02 ENCOUNTER — Emergency Department (HOSPITAL_COMMUNITY): Payer: 59

## 2015-03-02 ENCOUNTER — Telehealth: Payer: Self-pay | Admitting: Internal Medicine

## 2015-03-02 DIAGNOSIS — Z79899 Other long term (current) drug therapy: Secondary | ICD-10-CM | POA: Insufficient documentation

## 2015-03-02 DIAGNOSIS — J449 Chronic obstructive pulmonary disease, unspecified: Secondary | ICD-10-CM | POA: Insufficient documentation

## 2015-03-02 DIAGNOSIS — G629 Polyneuropathy, unspecified: Secondary | ICD-10-CM | POA: Insufficient documentation

## 2015-03-02 DIAGNOSIS — R002 Palpitations: Secondary | ICD-10-CM | POA: Insufficient documentation

## 2015-03-02 DIAGNOSIS — I2510 Atherosclerotic heart disease of native coronary artery without angina pectoris: Secondary | ICD-10-CM | POA: Insufficient documentation

## 2015-03-02 DIAGNOSIS — I10 Essential (primary) hypertension: Secondary | ICD-10-CM | POA: Insufficient documentation

## 2015-03-02 DIAGNOSIS — I509 Heart failure, unspecified: Secondary | ICD-10-CM | POA: Insufficient documentation

## 2015-03-02 DIAGNOSIS — Z7952 Long term (current) use of systemic steroids: Secondary | ICD-10-CM | POA: Insufficient documentation

## 2015-03-02 DIAGNOSIS — K219 Gastro-esophageal reflux disease without esophagitis: Secondary | ICD-10-CM | POA: Insufficient documentation

## 2015-03-02 DIAGNOSIS — Z7982 Long term (current) use of aspirin: Secondary | ICD-10-CM | POA: Insufficient documentation

## 2015-03-02 DIAGNOSIS — Z8679 Personal history of other diseases of the circulatory system: Secondary | ICD-10-CM

## 2015-03-02 DIAGNOSIS — Z87891 Personal history of nicotine dependence: Secondary | ICD-10-CM | POA: Insufficient documentation

## 2015-03-02 DIAGNOSIS — I252 Old myocardial infarction: Secondary | ICD-10-CM | POA: Insufficient documentation

## 2015-03-02 DIAGNOSIS — Z8739 Personal history of other diseases of the musculoskeletal system and connective tissue: Secondary | ICD-10-CM | POA: Insufficient documentation

## 2015-03-02 DIAGNOSIS — Z862 Personal history of diseases of the blood and blood-forming organs and certain disorders involving the immune mechanism: Secondary | ICD-10-CM | POA: Insufficient documentation

## 2015-03-02 LAB — CBC WITH DIFFERENTIAL/PLATELET
Basophils Absolute: 0.1 10*3/uL (ref 0.0–0.1)
Basophils Relative: 1 %
Eosinophils Absolute: 0.2 10*3/uL (ref 0.0–0.7)
Eosinophils Relative: 2 %
HCT: 40.7 % (ref 36.0–46.0)
Hemoglobin: 13.6 g/dL (ref 12.0–15.0)
Lymphocytes Relative: 36 %
Lymphs Abs: 3 10*3/uL (ref 0.7–4.0)
MCH: 31.6 pg (ref 26.0–34.0)
MCHC: 33.4 g/dL (ref 30.0–36.0)
MCV: 94.4 fL (ref 78.0–100.0)
Monocytes Absolute: 0.4 10*3/uL (ref 0.1–1.0)
Monocytes Relative: 5 %
Neutro Abs: 4.5 10*3/uL (ref 1.7–7.7)
Neutrophils Relative %: 56 %
Platelets: 267 10*3/uL (ref 150–400)
RBC: 4.31 MIL/uL (ref 3.87–5.11)
RDW: 12.6 % (ref 11.5–15.5)
WBC: 8.2 10*3/uL (ref 4.0–10.5)

## 2015-03-02 LAB — BASIC METABOLIC PANEL
Anion gap: 9 (ref 5–15)
BUN: 12 mg/dL (ref 6–20)
CO2: 27 mmol/L (ref 22–32)
Calcium: 9 mg/dL (ref 8.9–10.3)
Chloride: 105 mmol/L (ref 101–111)
Creatinine, Ser: 0.81 mg/dL (ref 0.44–1.00)
GFR calc Af Amer: >60 mL/min (ref >60)
GFR calc non Af Amer: >60 mL/min (ref >60)
Glucose, Bld: 90 mg/dL (ref 65–99)
Potassium: 3.4 mmol/L — ABNORMAL LOW (ref 3.5–5.1)
Sodium: 141 mmol/L (ref 135–145)

## 2015-03-02 LAB — TROPONIN I: Troponin I: <0.03 ng/mL (ref <0.031)

## 2015-03-02 NOTE — ED Provider Notes (Signed)
CSN: [id number]     Arrival date & time 03/02/15  1709 History   First MD Initiated Contact with Patient 03/02/15 1725     Chief Complaint  Patient presents with  . Chest Pain     (Consider location/radiation/quality/duration/timing/severity/associated sxs/prior Treatment) HPI Comments: Patient is a 54 year old female with history of SVT, hypertension, COPD. She presents today for evaluation of a suspected heart attack. She states that she was checking her pulse oximetry 3 days ago when she felt a racing of her heart and tightness in her chest. She states that her heart rate got up to 200 for several minutes, then slowly return to normal. She hesitated on coming to the emergency department has her symptoms that completely resolved. She called the cardiology clinic today to inform them she thought she had another heart attack and was referred here for workup. She denies to me she is experiencing any palpitations or discomfort at this time. She states this has all resolved since the last episode.  She reports a history of congestive heart failure and myocardial infarction, however my review of the chart reveals a heart cath with normal coronary arteries in 2015 and normal ejection fraction.  Patient is a 54 y.o. female presenting with chest pain. The history is provided by the patient.  Chest Pain Pain location:  Substernal area Pain quality: pressure   Pain radiates to:  Does not radiate Pain radiates to the back: no   Duration:  5 minutes Timing:  Constant Progression:  Resolved Relieved by:  Nothing Worsened by:  Nothing tried Ineffective treatments:  None tried   Past Medical History  Diagnosis Date  . COPD (chronic obstructive pulmonary disease) (HCC)   . Hypertension   . Myocardial infarct (HCC)   . CHF (congestive heart failure) (HCC)   . Anemia   . SVT (supraventricular tachycardia) (HCC)   . Peripheral neuropathy (HCC)   . Lung disease   . Visual disorder   . GERD  (gastroesophageal reflux disease)   . Coronary artery disease   . Cervical disc disease   . Spinal stenosis    Past Surgical History  Procedure Laterality Date  . Cesarean section    . Esophagogastroduodenoscopy N/A 02/10/2014    WUJ:[id number] tiny antral erosion; otherwise normal EGD. No explanation for patient's symptoms. Gallbladder needs further evaluation.  . Left heart catheterization with coronary angiogram N/A 06/03/2013    Procedure: LEFT HEART CATHETERIZATION WITH CORONARY ANGIOGRAM;  Surgeon: Lesleigh Noe, MD;  Location: [facility] CATH LAB;  Service: Cardiovascular;  Laterality: N/A;   Family History  Problem Relation Age of Onset  . Heart attack Mother   . Diabetes Mother   . Hypertension Mother   . Angina Mother   . Diabetes Father   . Hypertension Father   . Suicidality Sister   . Colon cancer      possibly Dad   . Heart failure Brother   . Stroke Maternal Grandmother   . Heart failure Maternal Grandmother   . Diabetes Maternal Grandfather    Social History  Substance Use Topics  . Smoking status: Former Smoker -- 1.00 packs/day for 20 years    Quit date: 05/31/2008  . Smokeless tobacco: None  . Alcohol Use: No   OB History    No data available     Review of Systems  Cardiovascular: Positive for chest pain.  All other systems reviewed and are negative.     Allergies  Bee venom and Tdap  Home  Medications   Prior to Admission medications   Medication Sig Start Date End Date Taking? Authorizing Provider  ALPRAZolam Prudy Feeler(XANAX) 1 MG tablet Take 1 mg by mouth at bedtime as needed for anxiety or sleep. Anxiety 05/03/13   Historical Provider, MD  aspirin EC 81 MG EC tablet Take 1 tablet (81 mg total) by mouth daily. 06/03/13   Lonia BloodJeffrey T McClung, MD  atorvastatin (LIPITOR) 20 MG tablet Take 20 mg by mouth every evening.  03/04/13   Historical Provider, MD  cyclobenzaprine (FLEXERIL) 10 MG tablet Take 10 mg by mouth daily as needed for muscle spasms.  07/19/13    Historical Provider, MD  diphenhydrAMINE (BENADRYL) 25 MG tablet Take 50 mg by mouth 2 (two) times daily as needed for itching or allergies.    Historical Provider, MD  fluticasone (CUTIVATE) 0.05 % cream Apply 1 application topically 2 (two) times a week.  06/24/13   Historical Provider, MD  furosemide (LASIX) 20 MG tablet Take 20 mg by mouth every evening.  07/30/13   Historical Provider, MD  gabapentin (NEURONTIN) 100 MG capsule Take 100 mg by mouth 3 (three) times daily. 05/25/13   Historical Provider, MD  HYDROcodone-acetaminophen (NORCO) 10-325 MG per tablet Take 1 tablet by mouth 4 (four) times daily. pain 05/03/13   Historical Provider, MD  levalbuterol [facility](XOPENEX HFA) 45 MCG/ACT inhaler Inhale 2 puffs into the lungs every 6 (six) hours as needed for wheezing. 06/03/13   Lonia BloodJeffrey T McClung, MD  Linaclotide [facility](LINZESS) 145 MCG CAPS capsule Take 1 capsule (145 mcg total) by mouth daily. 30 minutes before breakfast daily 01/20/14   Nira RetortAnna W Sams, NP  metoprolol tartrate (LOPRESSOR) 25 MG tablet Take 0.5 tablets (12.5 mg total) by mouth 2 (two) times daily. 06/03/13   Lonia BloodJeffrey T McClung, MD  nitroGLYCERIN (NITROSTAT) 0.4 MG SL tablet Place 1 tablet (0.4 mg total) under the tongue every 5 (five) minutes as needed for chest pain. 12/22/14   Rolland PorterMark James, MD  pantoprazole (PROTONIX) 40 MG tablet Take 40 mg by mouth 2 (two) times daily. 05/25/13   Historical Provider, MD   BP 130/88 mmHg  Pulse 87  Temp(Src) 97.6 F (36.4 C) (Oral)  Resp 22  Ht 5\' 1"  (1.549 m)  Wt 223 lb (101.152 kg)  BMI 42.16 kg/m2  SpO2 100%  LMP 01/05/2015 Physical Exam  Constitutional: She is oriented to person, place, and time. She appears well-developed and well-nourished. No distress.  HENT:  Head: Normocephalic and atraumatic.  Neck: Normal range of motion. Neck supple.  Cardiovascular: Normal rate and regular rhythm.  Exam reveals no gallop and no friction rub.   No murmur heard. Pulmonary/Chest: Effort normal and breath sounds  normal. No respiratory distress. She has no wheezes.  Abdominal: Soft. Bowel sounds are normal. She exhibits no distension. There is no tenderness.  Musculoskeletal: Normal range of motion.  Neurological: She is alert and oriented to person, place, and time.  Skin: Skin is warm and dry. She is not diaphoretic.  Nursing note and vitals reviewed.   ED Course  Procedures (including critical care time) Labs Review Labs Reviewed  CBC WITH DIFFERENTIAL/PLATELET  TROPONIN I  BASIC METABOLIC PANEL    Imaging Review No results found. I have personally reviewed and evaluated these images and lab results as part of my medical decision-making.  ED ECG REPORT   Date: 03/02/2015  Rate: 93  Rhythm: normal sinus rhythm  QRS Axis: normal  Intervals: normal  ST/T Wave abnormalities: normal  Conduction Disutrbances:none  Narrative Interpretation:   Old EKG Reviewed: none available  I have personally reviewed the EKG tracing and agree with the computerized printout as noted.   MDM   Final diagnoses:  None    Patient is a 54 year old female who presents with complaints of palpitations that occurred 3 days ago. She reports a heart rate of 200 for several minutes which subsequently resolved spontaneously. She called the cardiology office and was told to come here. She is concerned that she had a heart attack. I see no evidence of this on her EKG and her troponin is negative. Upon reviewing her record, I have found a normal heart cath in 2015 along with a normal ejection fraction. It has been 3 days since her episode of palpitations, and her troponin is negative. She also reports to me she never experienced any chest pain or difficulty breathing. I highly doubt any emergent cardiac problems. I feel as though she is appropriate for discharge. I will advise her to follow-up with her cardiologist if these episodes of palpitations persist. It sounds as though she has been offered an ablation in the  past and she may well benefit from this if her symptoms continue.    Geoffery Lyons, MD 03/02/15 [id number]

## 2015-03-02 NOTE — Telephone Encounter (Signed)
New message     Patient calling wanted Dr. Ladona Ridgelaylor to know she had another Heart Attack . Patient stated she called the [place] office [facility]

## 2015-03-02 NOTE — Discharge Instructions (Signed)
Call your cardiologist tomorrow to arrange a follow-up appointment.   Return to the ER if your symptoms significantly worsen or change.   Palpitations A palpitation is the feeling that your heartbeat is irregular or is faster than normal. It may feel like your heart is fluttering or skipping a beat. Palpitations are usually not a serious problem. However, in some cases, you may need further medical evaluation. CAUSES  Palpitations can be caused by:  Smoking.  Caffeine or other stimulants, such as diet pills or energy drinks.  Alcohol.  Stress and anxiety.  Strenuous physical activity.  Fatigue.  Certain medicines.  Heart disease, especially if you have a history of irregular heart rhythms (arrhythmias), such as atrial fibrillation, atrial flutter, or supraventricular tachycardia.  An improperly working pacemaker or defibrillator. DIAGNOSIS  To find the cause of your palpitations, your health care provider will take your medical history and perform a physical exam. Your health care provider may also have you take a test called an ambulatory electrocardiogram (ECG). An ECG records your heartbeat patterns over a 24-hour period. You may also have other tests, such as:  Transthoracic echocardiogram (TTE). During echocardiography, sound waves are used to evaluate how blood flows through your heart.  Transesophageal echocardiogram (TEE).  Cardiac monitoring. This allows your health care provider to monitor your heart rate and rhythm in real time.  Holter monitor. This is a portable device that records your heartbeat and can help diagnose heart arrhythmias. It allows your health care provider to track your heart activity for several days, if needed.  Stress tests by exercise or by giving medicine that makes the heart beat faster. TREATMENT  Treatment of palpitations depends on the cause of your symptoms and can vary greatly. Most cases of palpitations do not require any treatment  other than time, relaxation, and monitoring your symptoms. Other causes, such as atrial fibrillation, atrial flutter, or supraventricular tachycardia, usually require further treatment. HOME CARE INSTRUCTIONS   Avoid:  Caffeinated coffee, tea, soft drinks, diet pills, and energy drinks.  Chocolate.  Alcohol.  Stop smoking if you smoke.  Reduce your stress and anxiety. Things that can help you relax include:  A method of controlling things in your body, such as your heartbeats, with your mind (biofeedback).  Yoga.  Meditation.  Physical activity such as swimming, jogging, or walking.  Get plenty of rest and sleep. SEEK MEDICAL CARE IF:   You continue to have a fast or irregular heartbeat beyond 24 hours.  Your palpitations occur more often. SEEK IMMEDIATE MEDICAL CARE IF:  You have chest pain or shortness of breath.  You have a severe headache.  You feel dizzy or you faint. MAKE SURE YOU:  Understand these instructions.  Will watch your condition.  Will get help right away if you are not doing well or get worse.   This information is not intended to replace advice given to you by your health care provider. Make sure you discuss any questions you have with your health care provider.   Document Released: 03/18/2000 Document Revised: 03/26/2013 Document Reviewed: 05/20/2011 Elsevier Interactive Patient Education [employer]2016 [employer]

## 2015-03-02 NOTE — ED Notes (Signed)
Pt states "I had another heart attack a few days ago".  States that she knows this because she had a pulse ox on her finger and her heart rate went up in the 200s.  Pt denies pain.  States this was several days ago and she just wanted to "ride it out".

## 2015-03-10 ENCOUNTER — Emergency Department (HOSPITAL_COMMUNITY): Admission: EM | Admit: 2015-03-10 | Discharge: 2015-03-10 | Discharge status: Absent without leave | Payer: 59

## 2015-04-20 ENCOUNTER — Other Ambulatory Visit (HOSPITAL_COMMUNITY): Payer: Self-pay | Admitting: Internal Medicine

## 2015-04-20 DIAGNOSIS — Z1231 Encounter for screening mammogram for malignant neoplasm of breast: Secondary | ICD-10-CM

## 2015-04-21 ENCOUNTER — Ambulatory Visit (INDEPENDENT_AMBULATORY_CARE_PROVIDER_SITE_OTHER): Payer: BLUE CROSS/BLUE SHIELD | Admitting: Cardiology

## 2015-04-21 ENCOUNTER — Encounter: Payer: Self-pay | Admitting: Cardiology

## 2015-04-21 VITALS — BP 146/88 | HR 80 | Ht 61.0 in | Wt 212.0 lb

## 2015-04-21 DIAGNOSIS — Z0389 Encounter for observation for other suspected diseases and conditions ruled out: Secondary | ICD-10-CM | POA: Diagnosis not present

## 2015-04-21 DIAGNOSIS — I471 Supraventricular tachycardia: Secondary | ICD-10-CM | POA: Diagnosis not present

## 2015-04-21 DIAGNOSIS — I10 Essential (primary) hypertension: Secondary | ICD-10-CM | POA: Diagnosis not present

## 2015-04-21 DIAGNOSIS — IMO0001 Reserved for inherently not codable concepts without codable children: Secondary | ICD-10-CM

## 2015-04-21 MED ORDER — METOPROLOL TARTRATE 25 MG PO TABS: 25.0000 mg | ORAL_TABLET | Freq: Two times a day (BID) | ORAL | Status: DC

## 2015-04-21 NOTE — Progress Notes (Signed)
Cardiology Office Note  Date: 04/21/2015   ID: MITSUYE SCHRODT, DOB 02-16-1961, MRN [id number]  PCP: [name], MD  Primary Cardiologist: Lewayne Bunting M.D.  Chief Complaint  Patient presents with  . Discuss cardiac history  . History of SVT    History of Present Illness: SAMEEN LEAS is a 55 y.o. female patient of Dr. Ladona Ridgel, placed on my schedule today for evaluation. This is my first meeting with her. I reviewed her records and updated the chart. She last saw Dr. Ladona Ridgel in October 2016. History includes well-documented PSVT, episode of minor troponin I elevation to 0.33 back in February 2015 at which time she had documentation of normal coronary arteries by cardiac catheterization. She has been followed on medical therapy, and RFA has also been offered by Dr. Ladona Ridgel.  She came in today mainly to ask questions about her "angina, heart attack, and heart failure." I reviewed her records in detail and discussed the results with her. She did not have a heart attack during her presentation back in February 2015, rather myocardial strain in the setting of SVT. She had normal coronary arteries at that time, we discussed the results of her cardiac catheterization. She also had normal LVEF with no major valvular abnormalities by echocardiogram, we discussed these results as well.  She complains of intermittent feelings of rapid heart rate, sometimes associated with upper chest and left shoulder discomfort. These symptoms are sporadic. She is on very low-dose Lopressor. She has not had syncope associated with these symptoms recently.  Past Medical History  Diagnosis Date  . COPD (chronic obstructive pulmonary disease) (HCC)   . Essential hypertension   . Anemia   . SVT (supraventricular tachycardia) (HCC)   . Peripheral neuropathy (HCC)   . GERD (gastroesophageal reflux disease)   . Cervical disc disease   . Spinal stenosis   . History of cardiac catheterization     Normal  coronaries March 2015  . History of non-ST elevation myocardial infarction (NSTEMI)     Secondary to SVT    Past Surgical History  Procedure Laterality Date  . Cesarean section    . Esophagogastroduodenoscopy N/A 02/10/2014    WUJ:WJXBJY tiny antral erosion; otherwise normal EGD. No explanation for patient's symptoms. Gallbladder needs further evaluation.  . Left heart catheterization with coronary angiogram N/A 06/03/2013    Procedure: LEFT HEART CATHETERIZATION WITH CORONARY ANGIOGRAM;  Surgeon: Lesleigh Noe, MD;  Location: [facility] CATH LAB;  Service: Cardiovascular;  Laterality: N/A;    Current Outpatient Prescriptions  Medication Sig Dispense Refill  . ALPRAZolam (XANAX) 1 MG tablet Take 1 mg by mouth at bedtime as needed for anxiety or sleep. Anxiety    . aspirin EC 81 MG EC tablet Take 1 tablet (81 mg total) by mouth daily.    [name] atorvastatin (LIPITOR) 20 MG tablet Take 20 mg by mouth every evening.     . cyclobenzaprine (FLEXERIL) 10 MG tablet Take 10 mg by mouth daily as needed for muscle spasms.     . diphenhydrAMINE (BENADRYL) 25 MG tablet Take 50 mg by mouth 2 (two) times daily as needed for itching or allergies.    . fluticasone (CUTIVATE) 0.05 % cream Apply 1 application topically 2 (two) times a week.     . furosemide (LASIX) 20 MG tablet Take 20 mg by mouth 2 (two) times daily.     [name] gabapentin (NEURONTIN) 100 MG capsule Take 100 mg by mouth 3 (three) times daily.    [name]  HYDROcodone-acetaminophen (NORCO) 10-325 MG per tablet Take 1 tablet by mouth 4 (four) times daily. pain    . levalbuterol (XOPENEX HFA) 45 MCG/ACT inhaler Inhale 2 puffs into the lungs every 6 (six) hours as needed for wheezing. 1 Inhaler 0  . nitroGLYCERIN (NITROSTAT) 0.4 MG SL tablet Place 1 tablet (0.4 mg total) under the tongue every 5 (five) minutes as needed for chest pain. 10 tablet 0  . pantoprazole (PROTONIX) 40 MG tablet Take 40 mg by mouth 2 (two) times daily.    . Linaclotide (LINZESS) 145 MCG CAPS  capsule Take 1 capsule (145 mcg total) by mouth daily. 30 minutes before breakfast daily (Patient not taking: Reported on 04/21/2015) 30 capsule 5  . metoprolol tartrate (LOPRESSOR) 25 MG tablet Take 1 tablet (25 mg total) by mouth 2 (two) times daily. 180 tablet 3   No current facility-administered medications for this visit.   Allergies:  Bee venom and Tdap   Social History: The patient  reports that she quit smoking about 6 years ago. Her smoking use included Cigarettes. She has a 20 pack-year smoking history. She does not have any smokeless tobacco history on file. She reports that she does not drink alcohol or use illicit drugs.   ROS:  Please see the history of present illness. Otherwise, complete review of systems is positive for intermittent rashes and other skin changes followed by her primary care provider, anxiety.  All other systems are reviewed and negative.   Physical Exam: VS:  BP 146/88 mmHg  Pulse 80  Ht  (1.549 m)  Wt 212 lb (96.163 kg)  BMI 40.08 kg/m2  SpO2 98%  LMP 01/05/2015, BMI Body mass index is 40.08 kg/(m^2).  Wt Readings from Last 3 Encounters:  04/21/15 212 lb (96.163 kg)  03/02/15 223 lb (101.152 kg)  01/05/15 216 lb (97.977 kg)    General: Overweight woman, appears comfortable at rest. HEENT: Conjunctiva and lids normal, oropharynx clear with poor dentition. Neck: Supple, no elevated JVP or carotid bruits, no thyromegaly. Lungs: Clear to auscultation, nonlabored breathing at rest. Cardiac: Regular rate and rhythm, no S3 or significant systolic murmur, no pericardial rub. Abdomen: Soft, nontender, bowel sounds present. Extremities: No pitting edema, distal pulses 2+. Skin: Warm and dry. Musculoskeletal: No kyphosis. Neuropsychiatric: Alert and oriented x3, affect grossly appropriate.  ECG: ECG is not ordered today.  Recent Labwork: 03/02/2015: BUN 12; Creatinine, Ser 0.81; Hemoglobin 13.6; Platelets 267; Potassium 3.4*; Sodium 141       Component Value Date/Time   CHOL 204* 06/01/2013 0438   TRIG 60 06/01/2013 0438   HDL 76 06/01/2013 0438   CHOLHDL 2.7 06/01/2013 0438   VLDL 12 06/01/2013 0438   LDLCALC 116* 06/01/2013 0438    Other Studies Reviewed Today:  Echocardiogram 06/01/2013: Study Conclusions  - Left ventricle: The cavity size was normal. Wall thickness was normal. Systolic function was vigorous. The estimated ejection fraction was in the range of 65% to 70%. Wall motion was normal; there were no regional wall motion abnormalities. Doppler parameters are consistent with abnormal left ventricular relaxation (grade 1 diastolic dysfunction). - Right atrium: Central venous pressure: 8mm Hg (est). - Tricuspid valve: Mild regurgitation. - Pulmonary arteries: PA peak pressure: 24mm Hg (S). - Pericardium, extracardiac: A prominent pericardial fat pad was present. Impressions:  - Normal LV wall thickness with LVEF 65-70%, grade 1 diastolic dysfunction. Mild tricuspid regurgitation with PASP 24 mmHg. Prominent epicardial fat pad.  Cardiac catheterization 06/03/2013: HEMODYNAMICS: Aortic pressure 110/73 mmHg; LV  pressure 110/12 mmHg; LVEDP 17 mm mercury  ANGIOGRAPHIC DATA: The left main coronary artery is normal.  The left anterior descending artery is normal.  The left circumflex artery is normal.  The right coronary artery is normal.   LEFT VENTRICULOGRAM: Left ventricular angiogram was done in the 30 RAO projection and revealed normal global contractility with LVEF 60%   IMPRESSIONS: 1. Normal coronary arteries  2. Normal left ventricular systolic function, LVEF 60%  Assessment and Plan:  1. History of recurrent, symptomatic PSVT. She is followed by Dr. Ladona Ridgel. She has already been offered RFA, however continues to prefer holding off on this. I have recommended that she increase her Lopressor to 25 mg twice daily and she will follow-up with Dr. Ladona Ridgel in the next 3  months.  2. History of normal coronary arteries documented at cardiac catheterization in March 2015. This procedure was performed following presentation with SVT and minor increase in troponin I which reflected myocardial strain rather than true ACS. I discussed these records and results with the patient in detail today.  3. History of normal LVEF, 65-70% with grade 1 diastolic dysfunction in the setting of hypertension. No major valvular abnormalities by echocardiogram in February 2015. I discussed these results with patient as well.  4. Essential hypertension, keep follow-up with Dr. Sherwood Gambler.  Current medicines were reviewed with the patient today.   Disposition: FU with Dr. Ladona Ridgel in 3 months.   Signed, Jonelle Sidle, MD, [facility] 04/21/2015 11:04 AM    [facility] at [facility] [address] [place], [place] [address] Phone: [phone]; Fax: [phone]

## 2015-04-21 NOTE — Patient Instructions (Signed)
Medication Instructions:  Increase lopressor to 25 mg two times daily  Labwork: none  Testing/Procedures: none  Follow-Up: Your physician recommends that you schedule a follow-up appointment in: 3 months with Dr. Ladona Ridgel   Any Other Special Instructions Will Be Listed Below (If Applicable).     If you need a refill on your cardiac medications before your next appointment, please call your pharmacy.

## 2015-04-27 ENCOUNTER — Ambulatory Visit (HOSPITAL_COMMUNITY): Payer: Self-pay

## 2015-05-21 ENCOUNTER — Ambulatory Visit (HOSPITAL_COMMUNITY): Payer: Self-pay

## 2015-06-02 ENCOUNTER — Ambulatory Visit (INDEPENDENT_AMBULATORY_CARE_PROVIDER_SITE_OTHER): Payer: BLUE CROSS/BLUE SHIELD

## 2015-06-02 ENCOUNTER — Ambulatory Visit (INDEPENDENT_AMBULATORY_CARE_PROVIDER_SITE_OTHER): Payer: BLUE CROSS/BLUE SHIELD | Admitting: Orthopaedic Surgery

## 2015-06-02 VITALS — BP 118/72 | HR 73 | Temp 97.7°F | Ht 61.0 in | Wt 206.0 lb

## 2015-06-02 DIAGNOSIS — M25562 Pain in left knee: Secondary | ICD-10-CM

## 2015-06-02 NOTE — Progress Notes (Addendum)
CC:  Left knee pain  Subjective:    Patient ID: Gina Bradley, female    DOB: 01-11-1961, 55 y.o.   MRN: [id number]  Knee Pain  There was no injury mechanism. The pain is present in the left knee. The quality of the pain is described as aching and burning. The pain is at a severity of 5/10. The pain is moderate. The pain has been worsening since onset. Associated symptoms include an inability to bear weight and a loss of motion. Pertinent negatives include no loss of sensation, muscle weakness, numbness or tingling. The symptoms are aggravated by weight bearing and movement. She has tried acetaminophen, ice, immobilization, non-weight bearing and rest for the symptoms. The treatment provided mild relief.      Review of Systems  Constitutional: Positive for fatigue.       She has no diabetes. She has hypertension She has COPD She has no current smoking history.  HENT: Positive for tinnitus.   Eyes: Positive for visual disturbance.  Respiratory: Positive for shortness of breath.   Cardiovascular: Positive for palpitations.  Gastrointestinal: Positive for abdominal pain and abdominal distention.  Musculoskeletal: Positive for myalgias, back pain, joint swelling, arthralgias and gait problem.  Neurological: Positive for dizziness and weakness. Negative for tingling and numbness.  Psychiatric/Behavioral: The patient is nervous/anxious.    Social History   Social History  . Marital Status: Married    Spouse Name: N/A  . Number of Children: N/A  . Years of Education: N/A   Occupational History  . [employer]    Social History Main Topics  . Smoking status: Former Smoker -- 1.00 packs/day for 20 years    Types: Cigarettes    Quit date: 05/31/2008  . Smokeless tobacco: Not on file  . Alcohol Use: No  . Drug Use: No  . Sexual Activity: Not on file   Other Topics Concern  . Not on file   Social History Narrative  The patient has a family history of hypertension and  diabetes Past Surgical History  Procedure Laterality Date  . Cesarean section    . Esophagogastroduodenoscopy N/A 02/10/2014    NWG:NFAOZH tiny antral erosion; otherwise normal EGD. No explanation for patient's symptoms. Gallbladder needs further evaluation.  . Left heart catheterization with coronary angiogram N/A 06/03/2013    Procedure: LEFT HEART CATHETERIZATION WITH CORONARY ANGIOGRAM;  Surgeon: Lesleigh Noe, MD;  Location: [facility] CATH LAB;  Service: Cardiovascular;  Laterality: N/A;     BP 118/72 mmHg  Pulse 73  Temp(Src) 97.7 F (36.5 C)  Ht  (1.549 m)  Wt 206 lb (93.441 kg)  BMI 38.94 kg/m2  LMP 01/05/2015     Objective:   Physical Exam  Constitutional: She is oriented to person, place, and time. She appears well-developed and well-nourished.  HENT:  Head: Normocephalic and atraumatic.  Eyes: Conjunctivae and EOM are normal. Pupils are equal, round, and reactive to light.  Neck: Normal range of motion. Neck supple.  Cardiovascular: Normal rate, regular rhythm, normal heart sounds and intact distal pulses.   Pulmonary/Chest: Effort normal and breath sounds normal.  Abdominal: Soft.  Musculoskeletal: She exhibits tenderness (pain left knee with swelling and popping.  Decreased ROM).       Left knee: She exhibits decreased range of motion, swelling and effusion. Tenderness found. Medial joint line tenderness noted.       Legs: Neurological: She is alert and oriented to person, place, and time. She has normal reflexes. She displays normal  reflexes. No cranial nerve deficit. She exhibits normal muscle tone. Coordination normal.  Skin: Skin is warm and dry.  Psychiatric: She has a normal mood and affect. Her behavior is normal. Judgment and thought content normal.   The left lower extremity is examined:  Inspection:  Thigh:  Non-tender and no defects  Knee has swelling 1+ effusion.                        Joint tenderness is present                        Patient is  tender over the medial joint line  Lower Leg:  Has normal appearance and no tenderness or defects  Ankle:  Non-tender and no defects  Foot:  Non-tender and no defects Range of Motion:  Knee:  Range of motion is: 0 to 100                        Crepitus is  present  Ankle:  Range of motion is normal. Strength and Tone:  The left lower extremity has normal strength and tone. Stability:  Knee:  The knee is stable.  Ankle:  The ankle is stable.  The patient request injection, verbal consent was obtained.  The left knee was prepped appropriately after time out was performed.   Sterile technique was observed and injection of 1 cc of Depo-Medrol 40 mg with several cc's of plain xylocaine. Anesthesia was provided by ethyl chloride and a 20-gauge needle was used to inject the knee area. The injection was tolerated well.  A band aid dressing was applied.  The patient was advised to apply ice later today and tomorrow to the injection sight as needed.  I have told her I feel she needs a MRI of the right knee.  Her insurance company requires six weeks of conservative treatment before a MRI can be done.  She needs to use crutches or walker as needed, and precautions given.  I have talked to her about her hypertension.  She has no distal edema.  She needs to continue her medicine and diet.  She needs to be as active as she can be without significant pain of the knee.  Encounter Diagnosis  Name Primary?  . Left knee pain Yes        Assessment & Plan:  Take medicine, use walker or crutches.  Return in one month.  Call if any problem.

## 2015-06-30 ENCOUNTER — Ambulatory Visit: Payer: BLUE CROSS/BLUE SHIELD | Admitting: Orthopaedic Surgery

## 2015-06-30 ENCOUNTER — Encounter: Payer: Self-pay | Admitting: Orthopaedic Surgery

## 2015-07-01 NOTE — Addendum Note (Signed)
Addended by: Earnstine RegalKEELING, JOHN W on: 07/01/2015 09:38 PM   Modules accepted: Kipp BroodSmartSet

## 2015-10-21 ENCOUNTER — Encounter (HOSPITAL_COMMUNITY): Payer: Self-pay | Admitting: Emergency Medicine

## 2015-10-21 ENCOUNTER — Emergency Department (HOSPITAL_COMMUNITY)
Admission: EM | Admit: 2015-10-21 | Discharge: 2015-10-21 | Disposition: A | Discharge status: Home / Self Care | Payer: BLUE CROSS/BLUE SHIELD | Attending: Emergency Medicine | Admitting: Emergency Medicine

## 2015-10-21 DIAGNOSIS — T7840XA Allergy, unspecified, initial encounter: Secondary | ICD-10-CM | POA: Diagnosis not present

## 2015-10-21 DIAGNOSIS — I10 Essential (primary) hypertension: Secondary | ICD-10-CM | POA: Diagnosis not present

## 2015-10-21 DIAGNOSIS — Z87891 Personal history of nicotine dependence: Secondary | ICD-10-CM | POA: Diagnosis not present

## 2015-10-21 DIAGNOSIS — Z7982 Long term (current) use of aspirin: Secondary | ICD-10-CM | POA: Insufficient documentation

## 2015-10-21 DIAGNOSIS — Z79899 Other long term (current) drug therapy: Secondary | ICD-10-CM | POA: Insufficient documentation

## 2015-10-21 DIAGNOSIS — J449 Chronic obstructive pulmonary disease, unspecified: Secondary | ICD-10-CM | POA: Diagnosis not present

## 2015-10-21 MED ORDER — EPINEPHRINE 0.3 MG/0.3ML IJ SOAJ
INTRAMUSCULAR | Status: AC
  Filled 2015-10-21: qty 0.3

## 2015-10-21 MED ORDER — FAMOTIDINE IN NACL 20-0.9 MG/50ML-% IV SOLN
20.0000 mg | INTRAVENOUS | Status: AC
  Administered 2015-10-21: 20 mg via INTRAVENOUS
  Filled 2015-10-21: qty 50

## 2015-10-21 MED ORDER — METHYLPREDNISOLONE SODIUM SUCC 125 MG IJ SOLR
125.0000 mg | Freq: Once | INTRAMUSCULAR | Status: AC
  Administered 2015-10-21: 125 mg via INTRAVENOUS
  Filled 2015-10-21: qty 2

## 2015-10-21 MED ORDER — PREDNISONE 20 MG PO TABS: 40.0000 mg | ORAL_TABLET | Freq: Every day | ORAL | Status: DC

## 2015-10-21 MED ORDER — DIPHENHYDRAMINE HCL 50 MG/ML IJ SOLN
50.0000 mg | Freq: Once | INTRAMUSCULAR | Status: AC
  Administered 2015-10-21: 50 mg via INTRAVENOUS
  Filled 2015-10-21: qty 1

## 2015-10-21 MED ORDER — FAMOTIDINE 20 MG PO TABS: 20.0000 mg | ORAL_TABLET | Freq: Two times a day (BID) | ORAL | Status: DC

## 2015-10-21 MED ORDER — DIPHENHYDRAMINE HCL 25 MG PO TABS: 25.0000 mg | ORAL_TABLET | Freq: Four times a day (QID) | ORAL | Status: DC | PRN

## 2015-10-21 MED ORDER — ALBUTEROL SULFATE (2.5 MG/3ML) 0.083% IN NEBU
5.0000 mg | INHALATION_SOLUTION | Freq: Once | RESPIRATORY_TRACT | Status: AC
  Administered 2015-10-21: 5 mg via RESPIRATORY_TRACT
  Filled 2015-10-21: qty 6

## 2015-10-21 MED ORDER — EPINEPHRINE 0.3 MG/0.3ML IJ SOAJ: 0.3000 mg | Freq: Once | INTRAMUSCULAR | Status: AC | PRN

## 2015-10-21 NOTE — ED Notes (Signed)
Pt ambulatory to bathroom and back to room 

## 2015-10-21 NOTE — ED Notes (Signed)
Pt brought in by family stating she was having an allergic reaction to a bee sting she sustained prior to arrival.

## 2015-10-21 NOTE — ED Provider Notes (Signed)
CSN: [id number]     Arrival date & time 10/21/15  1957 History   First MD Initiated Contact with Patient 10/21/15 2004     Chief Complaint  Patient presents with  . Allergic Reaction     (Consider location/radiation/quality/duration/timing/severity/associated sxs/prior Treatment) HPI  The patient is a 55 year old female, she has a history of COPD, supraventricular tachycardia and a history of normal coronary arteries as of March 2015. She presents to the hospital after having a bee sting that occurred just prior to arrival on her right temple. She states that she has had a severe allergy to this in the past and has required steroids but states that she does not want a EpiPen because of her history of SVT. The symptoms were acute in onset, severe, persistent, not associated with difficulty breathing swelling of the throat or any rashes or itching other than the local reaction on her right temple.  Past Medical History  Diagnosis Date  . COPD (chronic obstructive pulmonary disease) (HCC)   . Essential hypertension   . Anemia   . SVT (supraventricular tachycardia) (HCC)   . Peripheral neuropathy (HCC)   . GERD (gastroesophageal reflux disease)   . Cervical disc disease   . Spinal stenosis   . History of cardiac catheterization     Normal coronaries March 2015  . History of non-ST elevation myocardial infarction (NSTEMI)     Secondary to SVT   Past Surgical History  Procedure Laterality Date  . Cesarean section    . Esophagogastroduodenoscopy N/A 02/10/2014    WUJ:WJXBJY tiny antral erosion; otherwise normal EGD. No explanation for patient's symptoms. Gallbladder needs further evaluation.  . Left heart catheterization with coronary angiogram N/A 06/03/2013    Procedure: LEFT HEART CATHETERIZATION WITH CORONARY ANGIOGRAM;  Surgeon: Lesleigh Noe, MD;  Location: [facility] CATH LAB;  Service: Cardiovascular;  Laterality: N/A;   Family History  Problem Relation Age of Onset  . Heart attack  Mother   . Diabetes Mother   . Hypertension Mother   . Angina Mother   . Diabetes Father   . Hypertension Father   . Suicidality Sister   . Colon cancer      Possibly Dad   . Heart failure Brother   . Stroke Maternal Grandmother   . Heart failure Maternal Grandmother   . Diabetes Maternal Grandfather    Social History  Substance Use Topics  . Smoking status: Former Smoker -- 1.00 packs/day for 20 years    Types: Cigarettes    Quit date: 05/31/2008  . Smokeless tobacco: None  . Alcohol Use: No   OB History    No data available     Review of Systems  All other systems reviewed and are negative.     Allergies  Bee venom and Tdap  Home Medications   Prior to Admission medications   Medication Sig Start Date End Date Taking? Authorizing Provider  ALPRAZolam Prudy Feeler) 1 MG tablet Take 1 mg by mouth at bedtime as needed for anxiety or sleep. Anxiety 05/03/13   Historical Provider, MD  aspirin EC 81 MG EC tablet Take 1 tablet (81 mg total) by mouth daily. 06/03/13   Lonia Blood, MD  atorvastatin (LIPITOR) 20 MG tablet Take 20 mg by mouth every evening.  03/04/13   Historical Provider, MD  cyclobenzaprine (FLEXERIL) 10 MG tablet Take 10 mg by mouth daily as needed for muscle spasms.  07/19/13   Historical Provider, MD  diphenhydrAMINE (BENADRYL) 25 MG tablet Take  1 tablet (25 mg total) by mouth every 6 (six) hours as needed for itching (Rash). 10/21/15   Eber Hong, MD  EPINEPHrine (EPIPEN 2-PAK) 0.3 mg/0.3 mL IJ SOAJ injection Inject 0.3 mLs (0.3 mg total) into the muscle once as needed (for severe allergic reaction). CAll 911 immediately if you have to use this medicine 10/21/15   Eber Hong, MD  famotidine (PEPCID) 20 MG tablet Take 1 tablet (20 mg total) by mouth 2 (two) times daily. 10/21/15   Eber Hong, MD  fluticasone (CUTIVATE) 0.05 % cream Apply 1 application topically 2 (two) times a week.  06/24/13   Historical Provider, MD  furosemide (LASIX) 20 MG tablet Take 20  mg by mouth 2 (two) times daily.  07/30/13   Historical Provider, MD  gabapentin (NEURONTIN) 100 MG capsule Take 100 mg by mouth 3 (three) times daily. 05/25/13   Historical Provider, MD  HYDROcodone-acetaminophen (NORCO) 10-325 MG per tablet Take 1 tablet by mouth 4 (four) times daily. pain 05/03/13   Historical Provider, MD  levalbuterol [facility] HFA) 45 MCG/ACT inhaler Inhale 2 puffs into the lungs every 6 (six) hours as needed for wheezing. 06/03/13   Lonia Blood, MD  Linaclotide [facility]) 145 MCG CAPS capsule Take 1 capsule (145 mcg total) by mouth daily. 30 minutes before breakfast daily 01/20/14   Nira Retort, NP  metoprolol tartrate (LOPRESSOR) 25 MG tablet Take 1 tablet (25 mg total) by mouth 2 (two) times daily. 04/21/15   Jonelle Sidle, MD  nitroGLYCERIN (NITROSTAT) 0.4 MG SL tablet Place 1 tablet (0.4 mg total) under the tongue every 5 (five) minutes as needed for chest pain. 12/22/14   Rolland Porter, MD  pantoprazole (PROTONIX) 40 MG tablet Take 40 mg by mouth 2 (two) times daily. 05/25/13   Historical Provider, MD  predniSONE (DELTASONE) 20 MG tablet Take 2 tablets (40 mg total) by mouth daily. 10/21/15   Eber Hong, MD  tolterodine (DETROL) 1 MG tablet  04/17/15   Historical Provider, MD  triamcinolone cream (KENALOG) 0.1 %  04/28/15   Historical Provider, MD   BP 147/92 mmHg  Pulse 114  Temp(Src) 98.4 F (36.9 C) (Oral)  Resp 18  Ht 5\' 1"  (1.549 m)  Wt 190 lb (86.183 kg)  BMI 35.92 kg/m2  SpO2 97%  LMP 01/05/2015 Physical Exam  Constitutional: She appears well-developed and well-nourished. No distress.  HENT:  Head: Normocephalic and atraumatic.  Mouth/Throat: Oropharynx is clear and moist. No oropharyngeal exudate.  Urticarial lesion to the right temple. Oropharynx is clear and moist, no uvular swelling, no oropharyngeal swelling, normal phonation  Eyes: Conjunctivae and EOM are normal. Pupils are equal, round, and reactive to light. Right eye exhibits no discharge. Left  eye exhibits no discharge. No scleral icterus.  Neck: Normal range of motion. Neck supple. No JVD present. No thyromegaly present.  Cardiovascular: Regular rhythm, normal heart sounds and intact distal pulses.  Exam reveals no gallop and no friction rub.   No murmur heard. Tachycardia to 110, normal pulses  Pulmonary/Chest: Effort normal and breath sounds normal. No respiratory distress. She has no wheezes. She has no rales.  No wheezing, mild tachypnea  Abdominal: Soft. Bowel sounds are normal. She exhibits no distension and no mass. There is no tenderness.  Musculoskeletal: Normal range of motion. She exhibits no edema or tenderness.  Lymphadenopathy:    She has no cervical adenopathy.  Neurological: She is alert. Coordination normal.  Skin: Skin is warm and dry. No rash noted.  No erythema.  Psychiatric: She has a normal mood and affect. Her behavior is normal.  Nursing note and vitals reviewed.   ED Course  Procedures (including critical care time) Labs Review Labs Reviewed - No data to display  Imaging Review No results found. I have personally reviewed and evaluated these images and lab results as part of my medical decision-making.   EKG Interpretation   Date/Time:  Wednesday October 21 2015 20:12:15 EDT Ventricular Rate:  99 PR Interval:    QRS Duration: 86 QT Interval:  350 QTC Calculation: 450 R Axis:   46 Text Interpretation:  Sinus rhythm Probable left atrial enlargement  Abnormal R-wave progression, early transition since last tracing no  significant change Confirmed by Hyacinth MeekerMILLER  MD, Kaleia Longhi ([id number]) on 10/21/2015  8:26:52 PM      MDM   Final diagnoses:  Allergic reaction, initial encounter    The patient has no signs of anaphylaxis at this time. She is mildly tachycardic and is severely anxious concerned that she will have recurrent anaphylaxis. She will need a slight albuterol nebulizer as she does have a history of COPD and feel short of breath though she is not  wheezing, we'll give Solu-Medrol, Pepcid and Benadryl, I do not see the need for epinephrine on arrival, she will need frequent re-evaluations and observation in the emergency department. I do not think she is in SVT, EKG pending  Rash has resolved, meds given, pulse at 105, goes higher when people come in the room and talk to her - she becomes anxious as well - no Sob, no wheezing, no oral sx and no rash.  Stable for discharge without needing epinephrine this evening, she will be given a prescription for this however and will need to follow-up with her doctor. EKG shows sinus rhythm with mild tachycardia, no SVT  Eber HongBrian Graeme Menees, MD 10/21/15 2325

## 2015-10-21 NOTE — Discharge Instructions (Signed)
You have been diagnosed with an allergic reaction. Usually allergic reactions like this are caused by exposures to something that you either ate or touched or smelled.  It may be related to a number of different exposures including a new perfume, topical creams, soaps, detergents, linens, clothing, medications. Occasionally we do not find an answer for why there is an allergic reaction. These are treated the same way including Benadryl as needed for itching and rash. (This can be used up to 50 mg every 6 hours as needed).  Pepcid 20 mg every night and prednisone once a day for 5 days. Please do not take the Benadryl and drive or take care of children or other imported duties as the Benadryl can make you sleepy.   ° °If you should develop severe or worsening symptoms including difficulty breathing, difficulty swallowing, wheezing or increased coughing or a rash that developed on the inside of your mouth or a worsening rash on your skin, return to the hospital immediately for a recheck. Please call your Dr. in the morning for a recheck in 2 days if you are still having symptoms. If you do not have a Dr. see the list below.  If we have identified the source of your allergic reaction, please avoid this at all costs. This means stopping the medication if it is a new medication or a voiding topical exposures such as creams lotions body soaps or deodorants if this is the source. ° °Allergic Reaction, Mild to Moderate °Allergies may happen from anything your body is sensitive to. This may be food, medications, pollens, chemicals, and nearly anything around you in everyday life that produces allergens. An allergen is anything that causes an allergy producing substance. Allergens cause your body to release allergic antibodies. Through a chain of events, they cause a release of histamine into the blood stream. Histamines are meant to protect you, but they also cause your discomfort. This is why antihistamines are often used  for allergies. Heredity is often a factor in causing allergic reactions. This means you may have some of the same allergies as your parents. °Allergies happen in all age groups. You may have some idea of what caused your reaction. There are many allergens around us. It may be difficult to know what caused your reaction. If this is a first time event, it may never happen again. Allergies cannot be cured but can be controlled with medications. °SYMPTOMS  °You may get some or all of the following problems from allergies. °· Swelling and itching in and around the mouth.  °· Tearing, itchy eyes.  °· Nasal congestion and runny nose.  °· Sneezing and coughing.  °· An itchy red rash or hives.  °· Vomiting or diarrhea.  °· Difficulty breathing.  °Seasonal allergies occur in all age groups. They are seasonal because they usually occur during the same season every year. They may be a reaction to molds, grass pollens, or tree pollens. Other causes of allergies are house dust mite allergens, pet dander and mold spores. These are just a common few of the thousands of allergens around us. All of the symptoms listed above happen when you come in contact with pollens and other allergens. Seasonal allergies are usually not life threatening. They are generally more of a nuisance that can often be handled using medications. °Hay fever is a combination of all or some of the above listed allergy problems. It may often be treated with simple over-the-counter medications such as diphenhydramine. Take medication as   directed. Check with your caregiver or package insert for child dosages. °TREATMENT AND HOME CARE INSTRUCTIONS °If hives or rash are present: °· Take medications as directed.  °· You may use an over-the-counter antihistamine (diphenhydramine) for hives and itching as needed. Do not drive or drink alcohol until medications used to treat the reaction have worn off. Antihistamines tend to make people sleepy.  °· Apply cold cloths  (compresses) to the skin or take baths in cool water. This will help itching. Avoid hot baths or showers. Heat will make a rash and itching worse.  °· If your allergies persist and become more severe, and over the counter medications are not effective, there are many new medications your caretaker can prescribe. Immunotherapy or desensitizing injections can be used if all else fails. Follow up with your caregiver if problems continue.  °SEEK MEDICAL CARE IF:  °· Your allergies are becoming progressively more troublesome.  °· You suspect a food allergy. Symptoms generally happen within 30 minutes of eating a food.  °· Your symptoms have not gone away within 2 days or are getting worse.  °· You develop new symptoms.  °· You want to retest yourself or your child with a food or drink you think causes an allergic reaction. Never test yourself or your child of a suspected allergy without being under the watchful eye of your caregivers. A second exposure to an allergen may be life-threatening.  °SEEK IMMEDIATE MEDICAL CARE IF: °· You develop difficulty breathing or wheezing, or have a tight feeling in your chest or throat.  °· You develop a swollen mouth, hives, swelling, or itching all over your body.  °A severe reaction with any of the above problems should be considered life-threatening. If you suddenly develop difficulty breathing call for local emergency medical help. THIS IS AN EMERGENCY. °MAKE SURE YOU:  °· Understand these instructions.  °· Will watch your condition.  °· Will get help right away if you are not doing well or get worse.  °Document Released: 01/16/2007 Document Revised: 03/10/2011 Document Reviewed: 01/16/2007 °ExitCare® Patient Information ©2012 [employer]. ° °RESOURCE GUIDE ° °Chronic Pain Problems: °Contact  [facility]  297-2271 °Patients need to be referred by their primary care doctor. ° °Insufficient Money for Medicine: °Contact [employer] Way:  call "211" or [facility] 271-5999. ° °No Primary Care Doctor: °- Call [facility]  832-8000 - can help you locate a primary care doctor that  accepts your insurance, provides certain services, etc. °- Physician Referral Service- 1-800-533-3463 ° °Agencies that provide inexpensive medical care: °- [facility]  [phone] °- [facility]  [phone] °- [facility]  [phone] °- [facility]  832-4777 °- [facility]  373-0678 °- [facility]  [phone] ° °Medicaid-accepting [place] Providers: °- [facility]- [address] ° [phone], Mon-Fri 9am-7pm, Sat 9am-1pm °- [facility]- [address], [address] ° [phone] °- [facility]- [address], [address] ° [phone] °- [facility]- [address] [place] [address] ° [phone] °- [facility]- [address], [phone] ° Only accepts [place] Access Medicaid patients after they have their name  applied to their card ° °Self Pay (no insurance) in [place]: °- Sickle Cell Patients: Dr Eric Dean, [facility] ° [address], [phone] °- [facility]- [address] °   [phone] °      -     [facility]- [address] °      -     [facility]- see information above (Speak to Pam H if you do not have insurance) °      -  [facility]- [address], [phone] °      -  [facility] [place]- [address],  [phone] °      -  [facility]- [address] [place] [address], [phone] °      -  Dr Osei-Bonsu-  [address], [place], [phone] °      -  [facility]- [address], [phone] °      -  [facility]- [address] [place] [address], [phone], also [address], [phone] °      -    [facility]- [address], [phone], 1st & 3rd Saturday   every month,  10am-1pm ° °1) Find a Doctor and Pay Out of Pocket °Although you won't have to find out who is covered by your insurance plan, it is a good idea to ask around and get recommendations. You will then need to call the office and see if the doctor you have chosen will accept you as a new patient and what types of options they offer for patients who are self-pay. Some doctors offer discounts or will set up payment plans for their patients who do not have insurance, but you will need to ask so you aren't surprised when you get to your appointment. ° °2) Contact Your Local Health Department °Not all health departments have doctors that can see patients for sick visits, but many do, so it is worth a call to see if yours does. If you don't know where your local health department is, you can check in your phone book. The CDC also has a tool to help you locate your state's health department, and many state websites also have listings of all of their local health departments. ° °3) Find a Walk-in Clinic °If your illness is not likely to be very severe or complicated, you may want to try a walk in clinic. These are popping up all over the country in pharmacies, drugstores, and shopping centers. They're usually staffed by nurse practitioners or physician assistants that have been trained to treat common illnesses and complaints. They're usually fairly quick and inexpensive. However, if you have serious medical issues or chronic medical problems, these are probably not your best option ° °STD Testing °- [place] [facility], [facility], [address], [place], phone [phone] or 1-877-539-9860.  Monday - Friday, call for an appointment. °- [place] [place], [facility] Clinic, [address], [place], phone [phone] or 1-877-539-9860.  Monday - Friday, call for an appointment. ° °Abuse/Neglect: °- [place] Child Abuse Hotline (336)  641-3795 °- [place] Child Abuse Hotline 800-378-5315 (After Hours) ° °Emergency Shelter:  [facility] (336) 271-5985 ° °Maternity Homes: °- [facility] (336) 275-9566 °- [facility] (704) 372-4663 ° °MRSA Hotline #:   832-7006 ° °[place] Resources ° °[facility] [place]  [employer] [place] [facility] °[address]                   [address]         [address]  °[place]                                                [place]                              [place] °Phone:  349-3220                                  Phone:  342-7768                   Phone:  342-8140 ° °[facility], 342-8316 °- [place] [facility] - [facility]- 1-888-581-9988 °      -     [facility] in [place], [address],                                  336-349-4454, Insurance ° °[place] Child Abuse Hotline °(336) 342-1394 or (336) 342-3537 (After Hours) ° ° °Behavioral Health Services ° °Substance Abuse Resources: °- [facility]  336-882-2125 °- [facility] 336-784-9470 °- [facility] 336-285-9073 °- [facility] 336-845-3988 °- Residential & Outpatient Substance Abuse Program  800-659-3381 ° °Psychological Services: °- [facility]  832-9600 °- [facility]  378-7881 °- [facility], [address], [place], ACCESS LINE: 1-800-853-5163 or 336-641-4981, Http://www.[facility]/services/adult.htm ° °Dental Assistance ° °If unable to pay or uninsured, contact:  [facility] or [facility] to become qualified for the adult dental clinic. ° °Patients with Medicaid: [facility] °[address], [phone] °[address], [phone] ° °If unable to pay, or uninsured, contact [facility] (271-5999) or [place] [facility] (641-3152 in [place], 842-7733 in [place]) to become qualified for the adult dental clinic ° °Other Low-Cost Community Dental Services: °- [facility]- [address], [place], [place], [address], 723-1848, [address]. [address], 2nd and 4th Thursday of the month at 6:30am.  10 clients each day by appointment, can sometimes see walk-in patients if someone does not show for an appointment. °- [facility]- [address], [place], [place], [address], 723-7904 °- [facility]- [address], [place], , [address], [phone] °- [facility] [facility]- 342-8273 °- [facility]- 703-3100 °- [facility]- 570-6415 ° ° ° ° ° °

## 2015-10-28 ENCOUNTER — Ambulatory Visit: Payer: BLUE CROSS/BLUE SHIELD | Admitting: Physician Assistant

## 2015-12-08 ENCOUNTER — Other Ambulatory Visit (HOSPITAL_COMMUNITY): Payer: Self-pay | Admitting: Respiratory Therapy

## 2015-12-08 DIAGNOSIS — J441 Chronic obstructive pulmonary disease with (acute) exacerbation: Secondary | ICD-10-CM

## 2015-12-09 ENCOUNTER — Other Ambulatory Visit (HOSPITAL_COMMUNITY): Payer: Self-pay | Admitting: Internal Medicine

## 2015-12-09 DIAGNOSIS — Z1231 Encounter for screening mammogram for malignant neoplasm of breast: Secondary | ICD-10-CM

## 2015-12-11 ENCOUNTER — Ambulatory Visit (HOSPITAL_COMMUNITY): Payer: Commercial Managed Care - PPO

## 2015-12-11 ENCOUNTER — Ambulatory Visit (HOSPITAL_COMMUNITY)
Admission: RE | Admit: 2015-12-11 | Discharge: 2015-12-11 | Disposition: A | Discharge status: Home / Self Care | Payer: BLUE CROSS/BLUE SHIELD | Source: Ambulatory Visit | Attending: Pulmonary Disease | Admitting: Pulmonary Disease

## 2015-12-11 DIAGNOSIS — J441 Chronic obstructive pulmonary disease with (acute) exacerbation: Secondary | ICD-10-CM | POA: Insufficient documentation

## 2015-12-11 DIAGNOSIS — J988 Other specified respiratory disorders: Secondary | ICD-10-CM | POA: Diagnosis not present

## 2015-12-11 MED ORDER — ALBUTEROL SULFATE (2.5 MG/3ML) 0.083% IN NEBU: 2.5000 mg | INHALATION_SOLUTION | Freq: Once | RESPIRATORY_TRACT | Status: DC

## 2015-12-11 MED ORDER — LEVALBUTEROL HCL 0.63 MG/3ML IN NEBU
0.6300 mg | INHALATION_SOLUTION | Freq: Once | RESPIRATORY_TRACT | Status: AC
  Administered 2015-12-11: 0.63 mg via RESPIRATORY_TRACT
  Filled 2015-12-11: qty 3

## 2015-12-13 LAB — PULMONARY FUNCTION TEST
DL/VA % pred: 0 %
DL/VA: 0 ml/min/mmHg/L
DLCO unc % pred: 0 %
DLCO unc: 0 ml/min/mmHg
FEF 25-75 Post: 1.45 L/sec
FEF 25-75 Pre: 1.58 L/sec
FEF2575-%Change-Post: -8 %
FEF2575-%Pred-Post: 62 %
FEF2575-%Pred-Pre: 67 %
FEV1-%Change-Post: -4 %
FEV1-%Pred-Post: 58 %
FEV1-%Pred-Pre: 61 %
FEV1-Post: 1.35 L
FEV1-Pre: 1.41 L
FEV1FVC-%Change-Post: 3 %
FEV1FVC-%Pred-Pre: 106 %
FEV6-%Change-Post: -7 %
FEV6-%Pred-Post: 54 %
FEV6-%Pred-Pre: 58 %
FEV6-Post: 1.55 L
FEV6-Pre: 1.68 L
FEV6FVC-%Pred-Post: 103 %
FEV6FVC-%Pred-Pre: 103 %
FVC-%Change-Post: -7 %
FVC-%Pred-Post: 52 %
FVC-%Pred-Pre: 56 %
FVC-Post: 1.55 L
FVC-Pre: 1.68 L
Post FEV1/FVC ratio: 87 %
Post FEV6/FVC ratio: 100 %
Pre FEV1/FVC ratio: 84 %
Pre FEV6/FVC Ratio: 100 %
RV % pred: 161 %
RV: 2.72 L
TLC % pred: 102 %
TLC: 4.58 L

## 2015-12-21 ENCOUNTER — Other Ambulatory Visit (HOSPITAL_COMMUNITY): Payer: Self-pay | Admitting: Internal Medicine

## 2015-12-21 ENCOUNTER — Ambulatory Visit (HOSPITAL_COMMUNITY)
Admission: RE | Admit: 2015-12-21 | Discharge: 2015-12-21 | Disposition: A | Discharge status: Home / Self Care | Payer: BLUE CROSS/BLUE SHIELD | Source: Ambulatory Visit | Attending: Internal Medicine | Admitting: Internal Medicine

## 2015-12-21 DIAGNOSIS — M7989 Other specified soft tissue disorders: Secondary | ICD-10-CM | POA: Insufficient documentation

## 2015-12-21 DIAGNOSIS — M79604 Pain in right leg: Secondary | ICD-10-CM | POA: Diagnosis present

## 2016-04-04 DIAGNOSIS — I509 Heart failure, unspecified: Secondary | ICD-10-CM

## 2016-04-04 HISTORY — DX: Heart failure, unspecified: I50.9

## 2016-05-02 ENCOUNTER — Other Ambulatory Visit: Payer: Self-pay | Admitting: Cardiology

## 2016-05-26 ENCOUNTER — Ambulatory Visit (INDEPENDENT_AMBULATORY_CARE_PROVIDER_SITE_OTHER): Payer: BLUE CROSS/BLUE SHIELD | Admitting: Internal Medicine

## 2016-05-26 ENCOUNTER — Encounter: Payer: Self-pay | Admitting: Internal Medicine

## 2016-05-26 VITALS — BP 135/81 | HR 94 | Ht 64.0 in | Wt 194.0 lb

## 2016-05-26 DIAGNOSIS — I471 Supraventricular tachycardia: Secondary | ICD-10-CM | POA: Diagnosis not present

## 2016-05-26 MED ORDER — METOPROLOL TARTRATE 25 MG PO TABS: ORAL_TABLET | ORAL | 3 refills | Status: DC

## 2016-05-26 MED ORDER — FUROSEMIDE 20 MG PO TABS: 20.0000 mg | ORAL_TABLET | Freq: Two times a day (BID) | ORAL | 11 refills | Status: DC

## 2016-05-26 NOTE — Progress Notes (Signed)
HPI Ms. Haliburton returns today for ongoing evaluation of SVT and chest pain. She is a pleasant middle aged woman with a h/o tachypalpitations and documented SVT. She has returned to work and overall feels better. She is still having palpitations but they are better since starting her beta blockers back. When she goes into SVT she will sometimes take a xanax. No syncope.     Allergies  Allergen Reactions  . Bee Venom   . Tdap [Diphth-Acell Pertussis-Tetanus]      Current Outpatient Prescriptions  Medication Sig Dispense Refill  . ALPRAZolam (XANAX) 1 MG tablet Take 1 mg by mouth at bedtime as needed for anxiety or sleep. Anxiety    . aspirin EC 81 MG EC tablet Take 1 tablet (81 mg total) by mouth daily.    [name] atorvastatin (LIPITOR) 20 MG tablet Take 20 mg by mouth every evening.     . cyclobenzaprine (FLEXERIL) 10 MG tablet Take 10 mg by mouth daily as needed for muscle spasms.     . diphenhydrAMINE (BENADRYL) 25 MG tablet Take 1 tablet (25 mg total) by mouth every 6 (six) hours as needed for itching (Rash). 30 tablet 0  . EPINEPHrine (EPIPEN 2-PAK) 0.3 mg/0.3 mL IJ SOAJ injection Inject 0.3 mLs (0.3 mg total) into the muscle once as needed (for severe allergic reaction). CAll 911 immediately if you have to use this medicine 2 Device 1  . furosemide (LASIX) 20 MG tablet Take 1 tablet (20 mg total) by mouth 2 (two) times daily. 60 tablet 11  . gabapentin (NEURONTIN) 100 MG capsule Take 100 mg by mouth 3 (three) times daily.    [name] HYDROcodone-acetaminophen (NORCO) 10-325 MG per tablet Take 1 tablet by mouth 4 (four) times daily. pain    . metoprolol tartrate (LOPRESSOR) 25 MG tablet TAKE (1) TABLET BY MOUTH TWICE DAILY. 180 tablet 3  . nitroGLYCERIN (NITROSTAT) 0.4 MG SL tablet Place 1 tablet (0.4 mg total) under the tongue every 5 (five) minutes as needed for chest pain. 10 tablet 0  . pantoprazole (PROTONIX) 40 MG tablet Take 40 mg by mouth 2 (two) times daily.    [name] tolterodine (DETROL) 1  MG tablet   10  . triamcinolone cream (KENALOG) 0.1 %   2  . fluticasone (CUTIVATE) 0.05 % cream Apply 1 application topically 2 (two) times a week.     . levalbuterol (XOPENEX HFA) 45 MCG/ACT inhaler Inhale 2 puffs into the lungs every 6 (six) hours as needed for wheezing. 1 Inhaler 0   No current facility-administered medications for this visit.      Past Medical History:  Diagnosis Date  . Anemia   . Cervical disc disease   . COPD (chronic obstructive pulmonary disease) (HCC)   . Essential hypertension   . GERD (gastroesophageal reflux disease)   . History of cardiac catheterization    Normal coronaries March 2015  . History of non-ST elevation myocardial infarction (NSTEMI)    Secondary to SVT  . Peripheral neuropathy (HCC)   . Spinal stenosis   . SVT (supraventricular tachycardia) (HCC)     ROS:   All systems reviewed and negative except as noted in the HPI.   Past Surgical History:  Procedure Laterality Date  . CESAREAN SECTION    . ESOPHAGOGASTRODUODENOSCOPY N/A 02/10/2014   MVH:QIONGE tiny antral erosion; otherwise normal EGD. No explanation for patient's symptoms. Gallbladder needs further evaluation.  [name] LEFT HEART CATHETERIZATION WITH CORONARY ANGIOGRAM N/A 06/03/2013  Procedure: LEFT HEART CATHETERIZATION WITH CORONARY ANGIOGRAM;  Surgeon: Lesleigh NoeHenry W Smith III, MD;  [facility];  Service: Cardiovascular;  Laterality: N/A;     Family History  Problem Relation Age of Onset  . Heart attack Mother   . Diabetes Mother   . Hypertension Mother   . Angina Mother   . Diabetes Father   . Hypertension Father   . Heart failure Brother   . Suicidality Sister   . Colon cancer      Possibly Dad   . Stroke Maternal Grandmother   . Heart failure Maternal Grandmother   . Diabetes Maternal Grandfather      Social History   Social History  . Marital status: Married    Spouse name: N/A  . Number of children: N/A  . Years of education: N/A   Occupational  History  . [employer]   Social History Main Topics  . Smoking status: Former Smoker    Packs/day: 1.00    Years: 20.00    Types: Cigarettes    Quit date: 05/31/2008  . Smokeless tobacco: Never Used  . Alcohol use No  . Drug use: No  . Sexual activity: Not on file   Other Topics Concern  . Not on file   Social History Narrative  . No narrative on file     BP 135/81   Pulse 94   Ht 5\' 4"  (1.626 m)   Wt 194 lb (88 kg)   LMP 01/05/2015   SpO2 97%   BMI 33.30 kg/m   Physical Exam:  obese appearing middle aged woman, NAD HEENT: Unremarkable Neck:  6 cm JVD, no thyromegally Back:  No CVA tenderness Lungs:  Clear with no wheezes HEART:  Regular rate rhythm, no murmurs, no rubs, no clicks Abd:  soft, obese, positive bowel sounds, no organomegally, no rebound, no guarding Ext:  2 plus pulses, no edema, no cyanosis, no clubbing Skin:  No rashes no nodules Neuro:  CN II through XII intact, motor grossly intact    Assess/Plan: 1. SVT - we discussed the treatment options. Because she feels fairly well and her SVT is reasonably well controlled, she will continue watchful waiting. No indication for ablation at this point. 2. HTN - her blood pressure is reasonably well controlled. Will follow. 3. Obesity - she has lost 20 lbs since going back to work. Hopefully this will continue.  Leonia ReevesGregg Taylor,M.D.

## 2016-05-26 NOTE — Patient Instructions (Signed)
Your physician wants you to follow-up in: 1 Year with Dr. Taylor. You will receive a reminder letter in the mail two months in advance. If you don't receive a letter, please call our office to schedule the follow-up appointment.  Your physician recommends that you continue on your current medications as directed. Please refer to the Current Medication list given to you today.  If you need a refill on your cardiac medications before your next appointment, please call your pharmacy.  Thank you for choosing [facility]!    

## 2016-06-22 ENCOUNTER — Other Ambulatory Visit (HOSPITAL_COMMUNITY)
Admission: AD | Admit: 2016-06-22 | Discharge: 2016-06-22 | Disposition: A | Discharge status: Home / Self Care | Payer: BLUE CROSS/BLUE SHIELD | Source: Skilled Nursing Facility | Attending: Urology | Admitting: Urology

## 2016-06-22 ENCOUNTER — Ambulatory Visit (INDEPENDENT_AMBULATORY_CARE_PROVIDER_SITE_OTHER): Payer: BLUE CROSS/BLUE SHIELD | Admitting: Urology

## 2016-06-22 DIAGNOSIS — R319 Hematuria, unspecified: Secondary | ICD-10-CM | POA: Insufficient documentation

## 2016-06-22 DIAGNOSIS — R3129 Other microscopic hematuria: Secondary | ICD-10-CM | POA: Diagnosis not present

## 2016-06-22 DIAGNOSIS — R3915 Urgency of urination: Secondary | ICD-10-CM | POA: Diagnosis not present

## 2016-06-22 LAB — URINALYSIS, ROUTINE W REFLEX MICROSCOPIC
Bilirubin Urine: NEGATIVE
Glucose, UA: NEGATIVE mg/dL
Ketones, ur: NEGATIVE mg/dL
Leukocytes, UA: NEGATIVE
Nitrite: NEGATIVE
Protein, ur: NEGATIVE mg/dL
Specific Gravity, Urine: 1.006 (ref 1.005–1.030)
pH: 7 (ref 5.0–8.0)

## 2016-06-28 ENCOUNTER — Other Ambulatory Visit: Payer: Self-pay | Admitting: Urology

## 2016-06-28 DIAGNOSIS — R3129 Other microscopic hematuria: Secondary | ICD-10-CM

## 2016-06-29 ENCOUNTER — Ambulatory Visit (HOSPITAL_COMMUNITY): Payer: BLUE CROSS/BLUE SHIELD

## 2016-07-16 ENCOUNTER — Encounter (HOSPITAL_COMMUNITY): Payer: Self-pay | Admitting: *Deleted

## 2016-07-16 ENCOUNTER — Emergency Department (HOSPITAL_COMMUNITY)
Admission: EM | Admit: 2016-07-16 | Discharge: 2016-07-16 | Disposition: A | Discharge status: Home / Self Care | Payer: BLUE CROSS/BLUE SHIELD | Attending: Emergency Medicine | Admitting: Emergency Medicine

## 2016-07-16 DIAGNOSIS — I10 Essential (primary) hypertension: Secondary | ICD-10-CM | POA: Insufficient documentation

## 2016-07-16 DIAGNOSIS — J449 Chronic obstructive pulmonary disease, unspecified: Secondary | ICD-10-CM | POA: Insufficient documentation

## 2016-07-16 DIAGNOSIS — Z79899 Other long term (current) drug therapy: Secondary | ICD-10-CM | POA: Insufficient documentation

## 2016-07-16 DIAGNOSIS — Z7982 Long term (current) use of aspirin: Secondary | ICD-10-CM | POA: Insufficient documentation

## 2016-07-16 DIAGNOSIS — R21 Rash and other nonspecific skin eruption: Secondary | ICD-10-CM | POA: Diagnosis present

## 2016-07-16 DIAGNOSIS — L237 Allergic contact dermatitis due to plants, except food: Secondary | ICD-10-CM | POA: Insufficient documentation

## 2016-07-16 DIAGNOSIS — Z87891 Personal history of nicotine dependence: Secondary | ICD-10-CM | POA: Insufficient documentation

## 2016-07-16 MED ORDER — PREDNISONE 10 MG PO TABS: ORAL_TABLET | ORAL | 0 refills | Status: DC

## 2016-07-16 MED ORDER — PREDNISONE 10 MG PO TABS
60.0000 mg | ORAL_TABLET | Freq: Once | ORAL | Status: AC
  Administered 2016-07-16: 60 mg via ORAL
  Filled 2016-07-16: qty 1

## 2016-07-16 NOTE — ED Triage Notes (Addendum)
Pt c/o red rash to posterior aspect of neck that wraps around and on left side of face underneath eye x 1 week. Pt has some scabbed over vesicles. Some areas are draining.

## 2016-07-16 NOTE — ED Provider Notes (Signed)
[facility] DEPT Provider Note   CSN: [id number] Arrival date & time: 07/16/16  1743     History   Chief Complaint Chief Complaint  Patient presents with  . Rash    HPI ADAN BEAL is a 56 y.o. female.  She presents for evaluation of rash on neck for several days, following working side around her rosebushes, about 7 days ago.  He has a history of shingles so is worried that that had recurred.  She complains of a rash on her bilateral posterior neck, and her left face.  She denies eye pain, blurred vision, fever, chills, nausea or vomiting.  She describes the rash as "itchy".  There are no other known modifying factors.  HPI  Past Medical History:  Diagnosis Date  . Anemia   . Cervical disc disease   . COPD (chronic obstructive pulmonary disease) (HCC)   . Essential hypertension   . GERD (gastroesophageal reflux disease)   . History of cardiac catheterization    Normal coronaries March 2015  . History of non-ST elevation myocardial infarction (NSTEMI)    Secondary to SVT  . Peripheral neuropathy   . Spinal stenosis   . SVT (supraventricular tachycardia) [facility])     Patient Active Problem List   Diagnosis Date Noted  . Chest pressure 01/05/2015  . Gastric erosion   . Encounter for screening colonoscopy 01/22/2014  . Constipation 01/20/2014  . GERD (gastroesophageal reflux disease) 01/20/2014  . Dyspepsia 01/20/2014  . SVT (supraventricular tachycardia) (HCC) 05/31/2013  . HTN (hypertension), benign 05/31/2013  . Hypercholesteremia 05/31/2013  . COPD (chronic obstructive pulmonary disease) (HCC) 05/31/2013  . Cervical stenosis of spine 05/31/2013  . Peripheral neuropathy 05/31/2013    Past Surgical History:  Procedure Laterality Date  . CESAREAN SECTION    . ESOPHAGOGASTRODUODENOSCOPY N/A 02/10/2014   [id number] tiny antral erosion; otherwise normal EGD. No explanation for patient's symptoms. Gallbladder needs further evaluation.  [name] LEFT HEART  CATHETERIZATION WITH CORONARY ANGIOGRAM N/A 06/03/2013   Procedure: LEFT HEART CATHETERIZATION WITH CORONARY ANGIOGRAM;  Surgeon: Lesleigh Noe, MD;  Location: [facility] CATH LAB;  Service: Cardiovascular;  Laterality: N/A;    OB History    No data available       Home Medications    Prior to Admission medications   Medication Sig Start Date End Date Taking? Authorizing Provider  ALPRAZolam Prudy Feeler) 1 MG tablet Take 1 mg by mouth at bedtime as needed for anxiety or sleep. Anxiety 05/03/13   Historical Provider, MD  aspirin EC 81 MG EC tablet Take 1 tablet (81 mg total) by mouth daily. 06/03/13   Lonia Blood, MD  atorvastatin (LIPITOR) 20 MG tablet Take 20 mg by mouth every evening.  03/04/13   Historical Provider, MD  cyclobenzaprine (FLEXERIL) 10 MG tablet Take 10 mg by mouth daily as needed for muscle spasms.  07/19/13   Historical Provider, MD  diphenhydrAMINE (BENADRYL) 25 MG tablet Take 1 tablet (25 mg total) by mouth every 6 (six) hours as needed for itching (Rash). 10/21/15   Eber Hong, MD  EPINEPHrine (EPIPEN 2-PAK) 0.3 mg/0.3 mL IJ SOAJ injection Inject 0.3 mLs (0.3 mg total) into the muscle once as needed (for severe allergic reaction). CAll 911 immediately if you have to use this medicine 10/21/15   Eber Hong, MD  fluticasone (CUTIVATE) 0.05 % cream Apply 1 application topically 2 (two) times a week.  06/24/13   Historical Provider, MD  furosemide (LASIX) 20 MG tablet Take 1 tablet (20  mg total) by mouth 2 (two) times daily. 05/26/16   Marinus Maw, MD  gabapentin (NEURONTIN) 100 MG capsule Take 100 mg by mouth 3 (three) times daily. 05/25/13   Historical Provider, MD  HYDROcodone-acetaminophen (NORCO) 10-325 MG per tablet Take 1 tablet by mouth 4 (four) times daily. pain 05/03/13   Historical Provider, MD  levalbuterol [facility] HFA) 45 MCG/ACT inhaler Inhale 2 puffs into the lungs every 6 (six) hours as needed for wheezing. 06/03/13   Lonia Blood, MD  metoprolol tartrate  (LOPRESSOR) 25 MG tablet TAKE (1) TABLET BY MOUTH TWICE DAILY. 05/26/16   Marinus Maw, MD  nitroGLYCERIN (NITROSTAT) 0.4 MG SL tablet Place 1 tablet (0.4 mg total) under the tongue every 5 (five) minutes as needed for chest pain. 12/22/14   Rolland Porter, MD  pantoprazole (PROTONIX) 40 MG tablet Take 40 mg by mouth 2 (two) times daily. 05/25/13   Historical Provider, MD  predniSONE (DELTASONE) 10 MG tablet Take q day 6,5,4,3,2,1 07/16/16   Mancel Bale, MD  tolterodine (DETROL) 1 MG tablet  04/17/15   Historical Provider, MD  triamcinolone cream (KENALOG) 0.1 %  04/28/15   Historical Provider, MD    Family History Family History  Problem Relation Age of Onset  . Heart attack Mother   . Diabetes Mother   . Hypertension Mother   . Angina Mother   . Diabetes Father   . Hypertension Father   . Heart failure Brother   . Suicidality Sister   . Colon cancer      Possibly Dad   . Stroke Maternal Grandmother   . Heart failure Maternal Grandmother   . Diabetes Maternal Grandfather     Social History Social History  Substance Use Topics  . Smoking status: Former Smoker    Packs/day: 1.00    Years: 20.00    Types: Cigarettes    Quit date: 05/31/2008  . Smokeless tobacco: Never Used  . Alcohol use No     Allergies   Bee venom and Tdap [diphth-acell pertussis-tetanus]   Review of Systems Review of Systems  All other systems reviewed and are negative.    Physical Exam Updated Vital Signs BP (!) 155/74   Pulse 71   Temp 98.1 F (36.7 C) (Oral)   Resp 18   Ht  (1.549 m)   Wt 192 lb (87.1 kg)   LMP 01/05/2015   SpO2 98%   BMI 36.28 kg/m   Physical Exam  Constitutional: She is oriented to person, place, and time. She appears well-developed and well-nourished.  HENT:  Head: Normocephalic and atraumatic.  Eyes: Conjunctivae and EOM are normal. Pupils are equal, round, and reactive to light.  Neck: Normal range of motion and phonation normal. Neck supple.    Cardiovascular: Normal rate.   Pulmonary/Chest: Effort normal. She exhibits no tenderness.  Musculoskeletal: Normal range of motion.  Neurological: She is alert and oriented to person, place, and time. She exhibits normal muscle tone.  Skin: Skin is warm and dry. Rash (Red rash base of neck bilaterally posteriorly, and similar rash left cheek.  These are in areas of external exposure, consistent with rhus dermatitis, exhibiting some weepiness, without areas of associated petechiae, or vesicles.) noted.  Psychiatric: She has a normal mood and affect. Her behavior is normal. Judgment and thought content normal.  Nursing note and vitals reviewed.    ED Treatments / Results  Labs (all labs ordered are listed, but only abnormal results are displayed) Labs Reviewed -  No data to display  EKG  EKG Interpretation None       Radiology No results found.  Procedures Procedures (including critical care time)  Medications Ordered in ED Medications  predniSONE (DELTASONE) tablet 60 mg (60 mg Oral Given 07/16/16 1816)     Initial Impression / Assessment and Plan / ED Course  I have reviewed the triage vital signs and the nursing notes.  Pertinent labs & imaging results that were available during my care of the patient were reviewed by me and considered in my medical decision making (see chart for details).     Medications  predniSONE (DELTASONE) tablet 60 mg (60 mg Oral Given 07/16/16 1816)    Patient Vitals for the past 24 hrs:  BP Temp Temp src Pulse Resp SpO2 Height Weight  07/16/16 1752 - - - - - -  (1.549 m) 192 lb (87.1 kg)  07/16/16 1751 (!) 155/74 98.1 F (36.7 C) Oral 71 18 98 % - -    At DC- reevaluation with update and discussion. After initial assessment and treatment, an updated evaluation reveals no further complaints, findings discussed with patient and all questions were answered. Jennings Corado L    Final Clinical Impressions(s) / ED Diagnoses   Final  diagnoses:  Poison ivy   Evaluation consistent with contact dermatitis.  Doubt shingles, or serious bacterial infection.  Nursing Notes Reviewed/ Care Coordinated Applicable Imaging Reviewed Interpretation of Laboratory Data incorporated into ED treatment  The patient appears reasonably screened and/or stabilized for discharge and I doubt any other medical condition or other [facility] requiring further screening, evaluation, or treatment in the ED at this time prior to discharge.  Plan: Home Medications-OTC antihistamine as needed itching; Home Treatments-rest, fluids; return here if the recommended treatment, does not improve the symptoms; Recommended follow up-PCP, as needed   New Prescriptions Discharge Medication List as of 07/16/2016  6:11 PM    START taking these medications   Details  predniSONE (DELTASONE) 10 MG tablet Take q day 6,5,4,3,2,1, Print         Mancel Bale, MD 07/16/16 2200

## 2016-07-19 ENCOUNTER — Ambulatory Visit (HOSPITAL_COMMUNITY)
Admission: RE | Admit: 2016-07-19 | Discharge: 2016-07-19 | Disposition: A | Discharge status: Home / Self Care | Payer: BLUE CROSS/BLUE SHIELD | Source: Ambulatory Visit | Attending: Urology | Admitting: Urology

## 2016-07-19 ENCOUNTER — Encounter (HOSPITAL_COMMUNITY): Payer: Self-pay

## 2016-08-03 ENCOUNTER — Ambulatory Visit: Payer: BLUE CROSS/BLUE SHIELD | Admitting: Urology

## 2016-08-05 ENCOUNTER — Ambulatory Visit (HOSPITAL_COMMUNITY)
Admission: RE | Admit: 2016-08-05 | Discharge: 2016-08-05 | Disposition: A | Discharge status: Home / Self Care | Payer: BLUE CROSS/BLUE SHIELD | Source: Ambulatory Visit | Attending: Urology | Admitting: Urology

## 2016-08-05 DIAGNOSIS — N281 Cyst of kidney, acquired: Secondary | ICD-10-CM | POA: Diagnosis not present

## 2016-08-05 DIAGNOSIS — R938 Abnormal findings on diagnostic imaging of other specified body structures: Secondary | ICD-10-CM | POA: Insufficient documentation

## 2016-08-05 DIAGNOSIS — R3129 Other microscopic hematuria: Secondary | ICD-10-CM | POA: Diagnosis not present

## 2016-08-05 LAB — POCT I-STAT CREATININE: Creatinine, Ser: 0.7 mg/dL (ref 0.44–1.00)

## 2016-08-05 MED ORDER — SODIUM CHLORIDE 0.9 % IV SOLN
INTRAVENOUS | Status: AC
  Filled 2016-08-05: qty 250

## 2016-08-05 MED ORDER — IOPAMIDOL (ISOVUE-300) INJECTION 61%
125.0000 mL | Freq: Once | INTRAVENOUS | Status: AC | PRN
  Administered 2016-08-05: 125 mL via INTRAVENOUS

## 2016-08-10 ENCOUNTER — Ambulatory Visit: Payer: BLUE CROSS/BLUE SHIELD | Admitting: Urology

## 2016-09-07 ENCOUNTER — Encounter: Payer: Self-pay | Admitting: *Deleted

## 2016-09-12 ENCOUNTER — Other Ambulatory Visit (HOSPITAL_COMMUNITY): Payer: Self-pay | Admitting: Internal Medicine

## 2016-09-12 DIAGNOSIS — Z1231 Encounter for screening mammogram for malignant neoplasm of breast: Secondary | ICD-10-CM

## 2016-09-19 ENCOUNTER — Ambulatory Visit (HOSPITAL_COMMUNITY)
Admission: RE | Admit: 2016-09-19 | Discharge: 2016-09-19 | Disposition: A | Discharge status: Home / Self Care | Payer: BLUE CROSS/BLUE SHIELD | Source: Ambulatory Visit | Attending: Internal Medicine | Admitting: Internal Medicine

## 2016-09-19 ENCOUNTER — Encounter: Payer: Self-pay | Admitting: Obstetrics and Gynecology

## 2016-09-19 DIAGNOSIS — Z1231 Encounter for screening mammogram for malignant neoplasm of breast: Secondary | ICD-10-CM

## 2016-09-20 ENCOUNTER — Ambulatory Visit: Payer: BLUE CROSS/BLUE SHIELD | Admitting: Orthopaedic Surgery

## 2016-09-21 ENCOUNTER — Ambulatory Visit (INDEPENDENT_AMBULATORY_CARE_PROVIDER_SITE_OTHER): Payer: BLUE CROSS/BLUE SHIELD | Admitting: Urology

## 2016-09-21 DIAGNOSIS — R3915 Urgency of urination: Secondary | ICD-10-CM

## 2016-09-21 DIAGNOSIS — R3129 Other microscopic hematuria: Secondary | ICD-10-CM | POA: Diagnosis not present

## 2016-10-06 ENCOUNTER — Ambulatory Visit (INDEPENDENT_AMBULATORY_CARE_PROVIDER_SITE_OTHER): Payer: BLUE CROSS/BLUE SHIELD | Admitting: Obstetrics and Gynecology

## 2016-10-06 ENCOUNTER — Encounter: Payer: Self-pay | Admitting: Obstetrics and Gynecology

## 2016-10-06 DIAGNOSIS — N950 Postmenopausal bleeding: Secondary | ICD-10-CM | POA: Diagnosis not present

## 2016-10-06 NOTE — Progress Notes (Signed)
[facility]  @DATE @            Patient name: Gina Bradley MRN [id number]  Date of birth: 06-Mar-1961  CC & HPI:  Gina Bradley is a 56 y.o. female presenting today forSingle episode heavy bleeding occurring 1 year postmenopausal. She was referred here by Dr. Sherwood GamblerFusco. Her last period she bled for 2 weeks then stopped after which she bled again. [facility]   She also reports decreased sexual interest/drive over the last few years. She is interested in hormone replacement therapy to aid with this.   ROS:  ROS +heavy and prolonged periods  +low sex drive  Pertinent History Reviewed:   Reviewed: no GYN surgeries  Medical         Past Medical History:  Diagnosis Date  . Anemia   . Cervical disc disease   . COPD (chronic obstructive pulmonary disease) (HCC)   . Essential hypertension   . GERD (gastroesophageal reflux disease)   . History of cardiac catheterization    Normal coronaries March 2015  . History of non-ST elevation myocardial infarction (NSTEMI)    Secondary to SVT  . Peripheral neuropathy   . Spinal stenosis   . SVT (supraventricular tachycardia) (HCC)                               Surgical Hx:    Past Surgical History:  Procedure Laterality Date  . CESAREAN SECTION    . ESOPHAGOGASTRODUODENOSCOPY N/A 02/10/2014   [id number]:Single tiny antral erosion; otherwise normal EGD. No explanation for patient's symptoms. Gallbladder needs further evaluation.  [name]. LEFT HEART CATHETERIZATION WITH CORONARY ANGIOGRAM N/A 06/03/2013   Procedure: LEFT HEART CATHETERIZATION WITH CORONARY ANGIOGRAM;  Surgeon: Lesleigh NoeHenry W Smith III, MD;  [facility];  Service: Cardiovascular;  Laterality: N/A;   Medications: Reviewed & Updated - see associated section                       Current Outpatient Prescriptions:  .  albuterol (PROVENTIL HFA;VENTOLIN HFA) 108 (90 Base) MCG/ACT inhaler, Inhale into the lungs every 6 (six) hours as needed  for wheezing or shortness of breath., Disp: , Rfl:  .  ALPRAZolam (XANAX) 1 MG tablet, Take 1 mg by mouth at bedtime as needed for anxiety or sleep. Anxiety, Disp: , Rfl:  .  ANORO ELLIPTA 62.5-25 MCG/INH AEPB, Inhale 1 puff into the lungs daily. , Disp: , Rfl: 11 .  aspirin EC 81 MG EC tablet, Take 1 tablet (81 mg total) by mouth daily., Disp: , Rfl:  .  atorvastatin (LIPITOR) 20 MG tablet, Take 20 mg by mouth every evening. , Disp: , Rfl:  .  co-enzyme Q-10 30 MG capsule, Take 100 mg by mouth daily., Disp: , Rfl:  .  cyclobenzaprine (FLEXERIL) 10 MG tablet, Take 10 mg by mouth as needed. , Disp: , Rfl: 3 .  EPINEPHrine (EPIPEN 2-PAK) 0.3 mg/0.3 mL IJ SOAJ injection, Inject 0.3 mLs (0.3 mg total) into the muscle once as needed (for severe allergic reaction). CAll 911 immediately if you have to use this medicine, Disp: 2 Device, Rfl: 1 .  furosemide (LASIX) 20 MG tablet, Take 1 tablet (20 mg total) by mouth 2 (two) times daily., Disp: 60 tablet, Rfl: 11 .  gabapentin (NEURONTIN) 100 MG capsule, Take 100 mg by mouth 3 (three)  times daily., Disp: , Rfl:  .  HYDROcodone-acetaminophen (NORCO) 10-325 MG per tablet, Take 1 tablet by mouth 4 (four) times daily. pain, Disp: , Rfl:  .  metoprolol tartrate (LOPRESSOR) 25 MG tablet, TAKE (1) TABLET BY MOUTH TWICE DAILY., Disp: 180 tablet, Rfl: 3 .  nitroGLYCERIN (NITROSTAT) 0.4 MG SL tablet, Place 1 tablet (0.4 mg total) under the tongue every 5 (five) minutes as needed for chest pain., Disp: 10 tablet, Rfl: 0 .  Oxycodone HCl 10 MG TABS, 10 mg as needed. , Disp: , Rfl: 0 .  pantoprazole (PROTONIX) 40 MG tablet, Take 40 mg by mouth 2 (two) times daily., Disp: , Rfl:  .  promethazine (PHENERGAN) 25 MG tablet, Take 25 mg by mouth every 6 (six) hours as needed for nausea or vomiting., Disp: , Rfl:  .  TOVIAZ 4 MG TB24 tablet, Take 4 mg by mouth daily. , Disp: , Rfl: 10 .  triamcinolone cream (KENALOG) 0.1 %, Apply 1 application topically as needed. , Disp: , Rfl:  2 .  VIT B6-VIT B12-OMEGA 3 ACIDS PO, Take 500 mg by mouth daily., Disp: , Rfl:    Social History: Reviewed -  reports that she has been smoking Cigarettes.  She has a 9.00 pack-year smoking history. She has never used smokeless tobacco.  Objective Findings:  Vitals: Blood pressure (!) 150/82, pulse 90, height 5\' 1"  (1.549 m), weight 197 lb 12.8 oz (89.7 kg), last menstrual period 01/05/2015.  Physical Examination: General appearance - alert, well appearing, and in no distress Mental status - alert, oriented to person, place, and time Pelvic -  VULVA: normal appearing vulva with no masses, tenderness or lesions,  VAGINA: normal appearing vagina with normal color and discharge, no lesions,  CERVIX: tiny cervix, light blood noted UTERUS: uterus is normal size, shape, consistency and nontender, anteverted,  ADNEXA: normal adnexa in size, nontender and no masses   Assessment & Plan:   A:  1. Postmenopausal bleeding  P:  1. Transvaginal US in 1 week  2. Follow up appt to discuss results     By signing my name below, I, Sonum Patel, attest that this documentation has been prepared under the direction and in the presence of Tilda Burrow, MD. Electronically Signed: Sonum Patel, Neurosurgeon. 10/06/16. 3:43 PM.  I personally performed the services described in this documentation, which was SCRIBED in my presence. The recorded information has been reviewed and considered accurate. It has been edited as necessary during review. Tilda Burrow, MD

## 2016-10-17 ENCOUNTER — Ambulatory Visit (INDEPENDENT_AMBULATORY_CARE_PROVIDER_SITE_OTHER): Payer: BLUE CROSS/BLUE SHIELD | Admitting: Obstetrics and Gynecology

## 2016-10-17 ENCOUNTER — Encounter: Payer: Self-pay | Admitting: Obstetrics and Gynecology

## 2016-10-17 ENCOUNTER — Ambulatory Visit (INDEPENDENT_AMBULATORY_CARE_PROVIDER_SITE_OTHER): Payer: BLUE CROSS/BLUE SHIELD

## 2016-10-17 ENCOUNTER — Other Ambulatory Visit: Payer: Self-pay | Admitting: Obstetrics and Gynecology

## 2016-10-17 VITALS — BP 130/84 | HR 74 | Ht 61.5 in | Wt 196.0 lb

## 2016-10-17 DIAGNOSIS — N950 Postmenopausal bleeding: Secondary | ICD-10-CM

## 2016-10-17 DIAGNOSIS — Z029 Encounter for administrative examinations, unspecified: Secondary | ICD-10-CM

## 2016-10-17 DIAGNOSIS — N840 Polyp of corpus uteri: Secondary | ICD-10-CM

## 2016-10-17 NOTE — Progress Notes (Signed)
[facility] Visit  10/17/2016            Patient name: Gina Dinningngela J Vitug MRN [id number]  Date of birth: [birth year]  CC & HPI:  Gina Bradley is a 56 y.o. female presenting today for an U/S referencing her single episode PMB. Pt has no other complaints or symptoms. She wants to discuss hormone therapy for her declining sex drive and mood irritation.  ROS:  ROS -fever -abdominal pain +mood irritation All systems are negative except as noted in the HPI and PMH.    Pertinent History Reviewed:   Reviewed: no GYN surgeries Medical         Past Medical History:  Diagnosis Date  . Anemia   . Cervical disc disease   . COPD (chronic obstructive pulmonary disease) (HCC)   . Essential hypertension   . GERD (gastroesophageal reflux disease)   . History of cardiac catheterization    Normal coronaries March 2015  . History of non-ST elevation myocardial infarction (NSTEMI)    Secondary to SVT  . Peripheral neuropathy   . Spinal stenosis   . SVT (supraventricular tachycardia) (HCC)                               Surgical Hx:    Past Surgical History:  Procedure Laterality Date  . CESAREAN SECTION    . ESOPHAGOGASTRODUODENOSCOPY N/A 02/10/2014   [id number]:Single tiny antral erosion; otherwise normal EGD. No explanation for patient's symptoms. Gallbladder needs further evaluation.  [name]. LEFT HEART CATHETERIZATION WITH CORONARY ANGIOGRAM N/A 06/03/2013   Procedure: LEFT HEART CATHETERIZATION WITH CORONARY ANGIOGRAM;  Surgeon: Lesleigh NoeHenry W Smith III, MD;  [facility];  Service: Cardiovascular;  Laterality: N/A;   Medications: Reviewed & Updated - see associated section                       Current Outpatient Prescriptions:  .  albuterol (PROVENTIL HFA;VENTOLIN HFA) 108 (90 Base) MCG/ACT inhaler, Inhale into the lungs every 6 (six) hours as needed for wheezing or shortness of breath., Disp: , Rfl:  .  ALPRAZolam (XANAX) 1 MG tablet, Take 1 mg by mouth at bedtime as needed for anxiety or  sleep. Anxiety, Disp: , Rfl:  .  ANORO ELLIPTA 62.5-25 MCG/INH AEPB, Inhale 1 puff into the lungs daily. , Disp: , Rfl: 11 .  aspirin EC 81 MG EC tablet, Take 1 tablet (81 mg total) by mouth daily., Disp: , Rfl:  .  atorvastatin (LIPITOR) 20 MG tablet, Take 20 mg by mouth every evening. , Disp: , Rfl:  .  co-enzyme Q-10 30 MG capsule, Take 100 mg by mouth daily., Disp: , Rfl:  .  cyclobenzaprine (FLEXERIL) 10 MG tablet, Take 10 mg by mouth as needed. , Disp: , Rfl: 3 .  EPINEPHrine (EPIPEN 2-PAK) 0.3 mg/0.3 mL IJ SOAJ injection, Inject 0.3 mLs (0.3 mg total) into the muscle once as needed (for severe allergic reaction). CAll 911 immediately if you have to use this medicine, Disp: 2 Device, Rfl: 1 .  furosemide (LASIX) 20 MG tablet, Take 1 tablet (20 mg total) by mouth 2 (two) times daily., Disp: 60 tablet, Rfl: 11 .  gabapentin (NEURONTIN) 100 MG capsule, Take 100 mg by mouth 3 (three) times daily., Disp: , Rfl:  .  HYDROcodone-acetaminophen (NORCO) 10-325 MG per tablet, Take 1 tablet by mouth 4 (four) times daily. pain, Disp: , Rfl:  .  metoprolol tartrate (LOPRESSOR) 25 MG tablet, TAKE (1) TABLET BY MOUTH TWICE DAILY., Disp: 180 tablet, Rfl: 3 .  nitroGLYCERIN (NITROSTAT) 0.4 MG SL tablet, Place 1 tablet (0.4 mg total) under the tongue every 5 (five) minutes as needed for chest pain., Disp: 10 tablet, Rfl: 0 .  Oxycodone HCl 10 MG TABS, 10 mg as needed. , Disp: , Rfl: 0 .  pantoprazole (PROTONIX) 40 MG tablet, Take 40 mg by mouth 2 (two) times daily., Disp: , Rfl:  .  promethazine (PHENERGAN) 25 MG tablet, Take 25 mg by mouth every 6 (six) hours as needed for nausea or vomiting., Disp: , Rfl:  .  TOVIAZ 4 MG TB24 tablet, Take 4 mg by mouth daily. , Disp: , Rfl: 10 .  triamcinolone cream (KENALOG) 0.1 %, Apply 1 application topically as needed. , Disp: , Rfl: 2 .  VIT B6-VIT B12-OMEGA 3 ACIDS PO, Take 500 mg by mouth daily., Disp: , Rfl:    Social History: Reviewed -  reports that she has been  smoking Cigarettes.  She has a 9.00 pack-year smoking history. She has never used smokeless tobacco.  Objective Findings:  Vitals: Last menstrual period 01/05/2015.  Physical Examination:  General appearance - alert, well appearing, and in no distress Mental status - alert, oriented to person, place, and time Chest - clear to auscultation, no wheezes, rales or rhonchi, symmetric air entry Heart - normal rate, regular rhythm, normal S1, S2, no murmurs, rubs, clicks or gallops Pelvic - not indicated   Assessment & Plan:   A:  1. Endometrial polyp  P:  1. Hysteroscopy, dilation and curettage, removal of endometrial polyp Through day surgery and [facility] within a month, probably early August     By signing my name below, I, Redge Gainer, attest that this documentation has been prepared under the direction and in the presence of Tilda Burrow, MD. Electronically Signed: Redge Gainer, ED Scribe. 10/17/16. 2:48 PM.  I personally performed the services described in this documentation, which was SCRIBED in my presence. The recorded information has been reviewed and considered accurate. It has been edited as necessary during review.

## 2016-10-17 NOTE — Progress Notes (Signed)
PELVIC US TA/TV: heterogeneous anteverted uterus,thickened complex fluid filled endometrium w/color flow,EEC 6.7 mm,normal ovaries bilat,no free fluid,limited view of endometrium and ovaries,mult.nabothian cysts

## 2016-10-17 NOTE — Progress Notes (Addendum)
[facility] Visit  10/17/2016            Patient name: Gina Dinningngela J Demelo MRN [id number]  Date of birth: 1960/11/21  CC & HPI:  Gina Bradley is a 56 y.o. female presenting today for an U/S referencing her single episode PMB. Pt has no other complaints or symptoms. She wants to discuss hormone therapy for her declining sex drive and mood irritation.  ROS:  ROS -fever -abdominal pain +mood irritation All systems are negative except as noted in the HPI and PMH.    Pertinent History Reviewed:   Reviewed: no GYN surgeries Medical         Past Medical History:  Diagnosis Date   Anemia    Cervical disc disease    COPD (chronic obstructive pulmonary disease) (HCC)    Essential hypertension    GERD (gastroesophageal reflux disease)    History of cardiac catheterization    Normal coronaries March 2015   History of non-ST elevation myocardial infarction (NSTEMI)    Secondary to SVT   Peripheral neuropathy    Spinal stenosis    SVT (supraventricular tachycardia) (HCC)                               Surgical Hx:    Past Surgical History:  Procedure Laterality Date   CESAREAN SECTION     ESOPHAGOGASTRODUODENOSCOPY N/A 02/10/2014   XLK:GMWNUURMR:Single tiny antral erosion; otherwise normal EGD. No explanation for patient's symptoms. Gallbladder needs further evaluation.   LEFT HEART CATHETERIZATION WITH CORONARY ANGIOGRAM N/A 06/03/2013   Procedure: LEFT HEART CATHETERIZATION WITH CORONARY ANGIOGRAM;  Surgeon: Lesleigh NoeHenry W Smith III, MD;  [facility];  Service: Cardiovascular;  Laterality: N/A;   Medications: Reviewed & Updated - see associated section                       Current Outpatient Prescriptions:    albuterol (PROVENTIL HFA;VENTOLIN HFA) 108 (90 Base) MCG/ACT inhaler, Inhale into the lungs every 6 (six) hours as needed for wheezing or shortness of breath., Disp: , Rfl:    ALPRAZolam (XANAX) 1 MG tablet, Take 1 mg by mouth at bedtime as needed for anxiety or  sleep. Anxiety, Disp: , Rfl:    ANORO ELLIPTA 62.5-25 MCG/INH AEPB, Inhale 1 puff into the lungs daily. , Disp: , Rfl: 11   aspirin EC 81 MG EC tablet, Take 1 tablet (81 mg total) by mouth daily., Disp: , Rfl:    atorvastatin (LIPITOR) 20 MG tablet, Take 20 mg by mouth every evening. , Disp: , Rfl:    co-enzyme Q-10 30 MG capsule, Take 100 mg by mouth daily., Disp: , Rfl:    cyclobenzaprine (FLEXERIL) 10 MG tablet, Take 10 mg by mouth as needed. , Disp: , Rfl: 3   EPINEPHrine (EPIPEN 2-PAK) 0.3 mg/0.3 mL IJ SOAJ injection, Inject 0.3 mLs (0.3 mg total) into the muscle once as needed (for severe allergic reaction). CAll 911 immediately if you have to use this medicine, Disp: 2 Device, Rfl: 1   furosemide (LASIX) 20 MG tablet, Take 1 tablet (20 mg total) by mouth 2 (two) times daily., Disp: 60 tablet, Rfl: 11   gabapentin (NEURONTIN) 100 MG capsule, Take 100 mg by mouth 3 (three) times daily., Disp: , Rfl:    HYDROcodone-acetaminophen (NORCO) 10-325 MG per tablet, Take 1 tablet by mouth 4 (four) times daily. pain, Disp: ,  Rfl:    metoprolol tartrate (LOPRESSOR) 25 MG tablet, TAKE (1) TABLET BY MOUTH TWICE DAILY., Disp: 180 tablet, Rfl: 3   nitroGLYCERIN (NITROSTAT) 0.4 MG SL tablet, Place 1 tablet (0.4 mg total) under the tongue every 5 (five) minutes as needed for chest pain., Disp: 10 tablet, Rfl: 0   Oxycodone HCl 10 MG TABS, 10 mg as needed. , Disp: , Rfl: 0   pantoprazole (PROTONIX) 40 MG tablet, Take 40 mg by mouth 2 (two) times daily., Disp: , Rfl:    promethazine (PHENERGAN) 25 MG tablet, Take 25 mg by mouth every 6 (six) hours as needed for nausea or vomiting., Disp: , Rfl:    TOVIAZ 4 MG TB24 tablet, Take 4 mg by mouth daily. , Disp: , Rfl: 10   triamcinolone cream (KENALOG) 0.1 %, Apply 1 application topically as needed. , Disp: , Rfl: 2   VIT B6-VIT B12-OMEGA 3 ACIDS PO, Take 500 mg by mouth daily., Disp: , Rfl:    Social History: Reviewed -  reports that she has been  smoking Cigarettes.  She has a 9.00 pack-year smoking history. She has never used smokeless tobacco.  Objective Findings:  Vitals: Last menstrual period 01/05/2015.  Physical Examination:  General appearance - alert, well appearing, and in no distress Mental status - alert, oriented to person, place, and time Chest - clear to auscultation, no wheezes, rales or rhonchi, symmetric air entry Heart - normal rate, regular rhythm, normal S1, S2, no murmurs, rubs, clicks or gallops Pelvic - not indicated   Assessment & Plan:   A:  1. Endometrial polyp  P:  1. Hysteroscopy, dilation and curettage, removal of endometrial polyp Through day surgery and [facility] within a month, probably early August     By signing my name below, I, Redge Gainer, attest that this documentation has been prepared under the direction and in the presence of Tilda Burrow, MD. Electronically Signed: Redge Gainer, ED Scribe. 10/17/16. 2:48 PM.  I personally performed the services described in this documentation, which was SCRIBED in my presence. The recorded information has been reviewed and considered accurate. It has been edited as necessary during review. Tilda Burrow, MD

## 2016-10-18 ENCOUNTER — Telehealth: Payer: Self-pay

## 2016-10-18 NOTE — Telephone Encounter (Signed)
Pt is calling to see if she would be able to be seen sooner because she is having trouble going to the bathroom. She is also wanting to about having some Linzess called into the pharmacy. Please advise

## 2016-10-18 NOTE — Telephone Encounter (Signed)
When did the constipation start? Is she having any bowel movements? Any abdominal pain? Has she tried anything to help her go?  The problem with just sending in Linzess is we haven't seen her in about 3 years and we need to make sure there's not something else going on before just giving her an Rx for Linzess.

## 2016-10-18 NOTE — Telephone Encounter (Addendum)
She said that the constipation stared on July 4th. The only way she has a bowel movement is if she takes a laxative. She is having some abd pain in the top of her stomach.  She said that she can't remember the does of Linzess that she use to take because it has been a couple of year since she has had it. She is having dis-en pack herself at times.

## 2016-10-19 ENCOUNTER — Encounter: Payer: Self-pay | Admitting: Internal Medicine

## 2016-10-19 NOTE — Telephone Encounter (Signed)
Unfortunately it's difficult to Rx any medications when it's been so long since we've seen a patient. This is for safety reasons due to if something else is going on other than simple constipation. We can put her on cancellation list to see if any opening comes up sooner (although not sure if she prefers RMR only?). She can try OTC stuff. She can also call her PCP to see if they would be able to see her sooner before seeing RMR.

## 2016-10-20 NOTE — Telephone Encounter (Signed)
Tried to call with no answer  

## 2016-10-21 NOTE — Telephone Encounter (Signed)
LMOM to call and that we close at noon today.  

## 2016-10-24 NOTE — Telephone Encounter (Signed)
PT left VM that she was returning our call. I called her @ [phone] and [facility]

## 2016-10-24 NOTE — Telephone Encounter (Signed)
Pt is aware and she will try to call her PCP

## 2016-10-27 ENCOUNTER — Ambulatory Visit (INDEPENDENT_AMBULATORY_CARE_PROVIDER_SITE_OTHER): Payer: BLUE CROSS/BLUE SHIELD | Admitting: Otolaryngology

## 2016-10-27 DIAGNOSIS — Z029 Encounter for administrative examinations, unspecified: Secondary | ICD-10-CM

## 2016-10-31 NOTE — Patient Instructions (Signed)
Gina Bradley  10/31/2016     @PREFPERIOPPHARMACY @   Your procedure is scheduled on  11/08/2016   Report to [facility] at  1010  A.M.  Call this number if you have problems the morning of surgery:  [phone]   Remember:  Do not eat food or drink liquids after midnight.  Take these medicines the morning of surgery with A SIP OF WATER  Flexaril, gabapentin, hydrocodone or oxycodone, metoprolol, protonix, phenergan, toviaz. Use your inhalers before you come.   Do not wear jewelry, make-up or nail polish.  Do not wear lotions, powders, or perfumes, or deoderant.  Do not shave 48 hours prior to surgery.  Men may shave face and neck.  Do not bring valuables to the hospital.  [facility] not [facility] any belongings or valuables.  Contacts, dentures or bridgework may not be worn into surgery.  Leave your suitcase in the car.  After surgery it may be brought to your room.  For patients admitted to the hospital, discharge time will be determined by your treatment team.  Patients discharged the day of surgery will not be allowed to drive home.   Name and phone number of your driver:   family Special instructions:  family  Please read over the following fact sheets that you were given. Anesthesia Post-op Instructions and Care and Recovery After Surgery      Hysteroscopy Hysteroscopy is a procedure used for looking inside the womb (uterus). It may be done for various reasons, including:  To evaluate abnormal bleeding, fibroid (benign, noncancerous) tumors, polyps, scar tissue (adhesions), and possibly cancer of the uterus.  To look for lumps (tumors) and other uterine growths.  To look for causes of why a woman cannot get pregnant (infertility), causes of recurrent loss of pregnancy (miscarriages), or a lost intrauterine device (IUD).  To perform a sterilization by blocking the fallopian tubes from inside the uterus.  In this procedure, a thin,  flexible tube with a tiny light and camera on the end of it (hysteroscope) is used to look inside the uterus. A hysteroscopy should be done right after a menstrual period to be sure you are not pregnant. [facility] KNOW ABOUT:  Any allergies you have.  All medicines you are taking, including vitamins, herbs, eye drops, creams, and over-the-counter medicines.  Previous problems you or members of your family have had with the use of anesthetics.  Any blood disorders you have.  Previous surgeries you have had.  Medical conditions you have. RISKS AND COMPLICATIONS Generally, this is a safe procedure. However, as with any procedure, complications can occur. Possible complications include:  Putting a hole in the uterus.  Excessive bleeding.  Infection.  Damage to the cervix.  Injury to other organs.  Allergic reaction to medicines.  Too much fluid used in the uterus for the procedure.  BEFORE THE PROCEDURE  Ask your health care provider about changing or stopping any regular medicines.  Do not take aspirin or blood thinners for 1 week before the procedure, or as directed by your health care provider. These can cause bleeding.  If you smoke, do not smoke for 2 weeks before the procedure.  In some cases, a medicine is placed in the cervix the day before the procedure. This medicine makes the cervix have a larger opening (dilate). This makes it easier for the instrument to be inserted into  the uterus during the procedure.  Do not eat or drink anything for at least 8 hours before the surgery.  Arrange for someone to take you home after the procedure. PROCEDURE  You may be given a medicine to relax you (sedative). You may also be given one of the following: ? A medicine that numbs the area around the cervix (local anesthetic). ? A medicine that makes you sleep through the procedure (general anesthetic).  The hysteroscope is inserted through the vagina into  the uterus. The camera on the hysteroscope sends a picture to a TV screen. This gives the surgeon a good view inside the uterus.  During the procedure, air or a liquid is put into the uterus, which allows the surgeon to see better.  Sometimes, tissue is gently scraped from inside the uterus. These tissue samples are sent to a lab for testing. What to expect after the procedure  If you had a general anesthetic, you may be groggy for a couple hours after the procedure.  If you had a local anesthetic, you will be able to go home as soon as you are stable and feel ready.  You may have some cramping. This normally lasts for a couple days.  You may have bleeding, which varies from light spotting for a few days to menstrual-like bleeding for 3-7 days. This is normal.  If your test results are not back during the visit, make an appointment with your health care provider to find out the results. This information is not intended to replace advice given to you by your health care provider. Make sure you discuss any questions you have with your health care provider. Document Released: 06/27/2000 Document Revised: 08/27/2015 Document Reviewed: 10/18/2012 Elsevier Interactive Patient Education  2017 [employer]. Hysteroscopy, Care After Refer to this sheet in the next few weeks. These instructions provide you with information on caring for yourself after your procedure. Your health care provider may also give you more specific instructions. Your treatment has been planned according to current medical practices, but problems sometimes occur. Call your health care provider if you have any problems or questions after your procedure. What can I expect after the procedure? After your procedure, it is typical to have the following:  You may have some cramping. This normally lasts for a couple days.  You may have bleeding. This can vary from light spotting for a few days to menstrual-like bleeding for 3-7  days.  Follow these instructions at home:  Rest for the first 1-2 days after the procedure.  Only take over-the-counter or prescription medicines as directed by your health care provider. Do not take aspirin. It can increase the chances of bleeding.  Take showers instead of baths for 2 weeks or as directed by your health care provider.  Do not drive for 24 hours or as directed.  Do not drink alcohol while taking pain medicine.  Do not use tampons, douche, or have sexual intercourse for 2 weeks or until your health care provider says it is okay.  Take your temperature twice a day for 4-5 days. Write it down each time.  Follow your health care provider's advice about diet, exercise, and lifting.  If you develop constipation, you may: ? Take a mild laxative if your health care provider approves. ? Add bran foods to your diet. ? Drink enough fluids to keep your urine clear or pale yellow.  Try to have someone with you or available to you for the first 24-48 hours,  especially if you were given a general anesthetic.  Follow up with your health care provider as directed. Contact a health care provider if:  You feel dizzy or lightheaded.  You feel sick to your stomach (nauseous).  You have abnormal vaginal discharge.  You have a rash.  You have pain that is not controlled with medicine. Get help right away if:  You have bleeding that is heavier than a normal menstrual period.  You have a fever.  You have increasing cramps or pain, not controlled with medicine.  You have new belly (abdominal) pain.  You pass out.  You have pain in the tops of your shoulders (shoulder strap areas).  You have shortness of breath. This information is not intended to replace advice given to you by your health care provider. Make sure you discuss any questions you have with your health care provider. Document Released: 01/09/2013 Document Revised: 08/27/2015 Document Reviewed:  10/18/2012 Elsevier Interactive Patient Education  2017 Elsevier Inc.  Dilation and Curettage or Vacuum Curettage Dilation and curettage (D&C) and vacuum curettage are minor procedures. A D&C involves stretching (dilation) the cervix and scraping (curettage) the inside lining of the uterus (endometrium). During a D&C, tissue is gently scraped from the endometrium, starting from the top portion of the uterus down to the lowest part of the uterus (cervix). During a vacuum curettage, the lining and tissue in the uterus are removed with the use of gentle suction. Curettage may be performed to either diagnose or treat a problem. As a diagnostic procedure, curettage is performed to examine tissues from the uterus. A diagnostic curettage may be done if you have:  Irregular bleeding in the uterus.  Bleeding with the development of clots.  Spotting between menstrual periods.  Prolonged menstrual periods or other abnormal bleeding.  Bleeding after menopause.  No menstrual period (amenorrhea).  A change in size and shape of the uterus.  Abnormal endometrial cells discovered during a Pap test.  As a treatment procedure, curettage may be performed for the following reasons:  Removal of an IUD (intrauterine device).  Removal of retained placenta after giving birth.  Abortion.  Miscarriage.  Removal of endometrial polyps.  Removal of uncommon types of noncancerous lumps (fibroids).  Tell a health care provider about:  Any allergies you have, including allergies to prescribed medicine or latex.  All medicines you are taking, including vitamins, herbs, eye drops, creams, and over-the-counter medicines. This is especially important if you take any blood-thinning medicine. Bring a list of all of your medicines to your appointment.  Any problems you or family members have had with anesthetic medicines.  Any blood disorders you have.  Any surgeries you have had.  Your medical history and  any medical conditions you have.  Whether you are pregnant or may be pregnant.  Recent vaginal infections you have had.  Recent menstrual periods, bleeding problems you have had, and what form of birth control (contraception) you use. What are the risks? Generally, this is a safe procedure. However, problems may occur, including:  Infection.  Heavy vaginal bleeding.  Allergic reactions to medicines.  Damage to the cervix or other structures or organs.  Development of scar tissue (adhesions) inside the uterus, which can cause abnormal amounts of menstrual bleeding. This may make it harder to get pregnant in the future.  A hole (perforation) or puncture in the uterine wall. This is rare.  What happens before the procedure? Staying hydrated Follow instructions from your health care provider about hydration,  which may include:  Up to 2 hours before the procedure - you may continue to drink clear liquids, such as water, clear fruit juice, black coffee, and plain tea.  Eating and drinking restrictions Follow instructions from your health care provider about eating and drinking, which may include:  8 hours before the procedure - stop eating heavy meals or foods such as meat, fried foods, or fatty foods.  6 hours before the procedure - stop eating light meals or foods, such as toast or cereal.  6 hours before the procedure - stop drinking milk or drinks that contain milk.  2 hours before the procedure - stop drinking clear liquids. If your health care provider told you to take your medicine(s) on the day of your procedure, take them with only a sip of water.  Medicines  Ask your health care provider about: ? Changing or stopping your regular medicines. This is especially important if you are taking diabetes medicines or blood thinners. ? Taking medicines such as aspirin and ibuprofen. These medicines can thin your blood. Do not take these medicines before your procedure if your  health care provider instructs you not to.  You may be given antibiotic medicine to help prevent infection. General instructions  For 24 hours before your procedure, do not: ? Douche. ? Use tampons. ? Use medicines, creams, or suppositories in the vagina. ? Have sexual intercourse.  You may be given a pregnancy test on the day of the procedure.  Plan to have someone take you home from the hospital or clinic.  You may have a blood or urine sample taken.  If you will be going home right after the procedure, plan to have someone with you for 24 hours. What happens during the procedure?  To reduce your risk of infection: ? Your health care team will wash or sanitize their hands. ? Your skin will be washed with soap.  An IV tube will be inserted into one of your veins.  You will be given one of the following: ? A medicine that numbs the area in and around the cervix (local anesthetic). ? A medicine to make you fall asleep (general anesthetic).  You will lie down on your back, with your feet in foot rests (stirrups).  The size and position of your uterus will be checked.  A lubricated instrument (speculum or Sims retractor) will be inserted into the back side of your vagina. The speculum will be used to hold apart the walls of your vagina so your health care provider can see your cervix.  A tool (tenaculum) will be attached to the lip of the cervix to stabilize it.  Your cervix will be softened and dilated. This may be done by: ? Taking a medicine. ? Having tapered dilators or thin rods (laminaria) or gradual widening instruments (tapered dilators) inserted into your cervix.  A small, sharp, curved instrument (curette) will be used to scrape a small amount of tissue or cells from the endometrium or cervical canal. In some cases, gentle suction is applied with the curette. The curette will then be removed. The cells will be taken to a lab for testing. The procedure may vary among  health care providers and hospitals. What happens after the procedure?  You may have mild cramping, backache, pain, and light bleeding or spotting. You may pass small blood clots from your vagina.  You may have to wear compression stockings. These stockings help to prevent blood clots and reduce swelling in your legs.  Your blood pressure, heart rate, breathing rate, and blood oxygen level will be monitored until the medicines you were given have worn off. Summary  Dilation and curettage (D&C) involves stretching (dilation) the cervix and scraping (curettage) the inside lining of the uterus (endometrium).  After the procedure, you may have mild cramping, backache, pain, and light bleeding or spotting. You may pass small blood clots from your vagina.  Plan to have someone take you home from the hospital or clinic. This information is not intended to replace advice given to you by your health care provider. Make sure you discuss any questions you have with your health care provider. Document Released: 03/21/2005 Document Revised: 12/06/2015 Document Reviewed: 12/06/2015 Elsevier Interactive Patient Education  2018 [employer]  Dilation and Curettage or Vacuum Curettage, Care After These instructions give you information about caring for yourself after your procedure. Your doctor may also give you more specific instructions. Call your doctor if you have any problems or questions after your procedure. Follow these instructions at home: Activity  Do not drive or use heavy machinery while taking prescription pain medicine.  For 24 hours after your procedure, avoid driving.  Take short walks often, followed by rest periods. Ask your doctor what activities are safe for you. After one or two days, you may be able to return to your normal activities.  Do not lift anything that is heavier than 10 lb (4.5 kg) until your doctor approves.  For at least 2 weeks, or as long as told by your  doctor: ? Do not douche. ? Do not use tampons. ? Do not have sex. General instructions  Take over-the-counter and prescription medicines only as told by your doctor. This is very important if you take blood thinning medicine.  Do not take baths, swim, or use a hot tub until your doctor approves. Take showers instead of baths.  Wear compression stockings as told by your doctor.  It is up to you to get the results of your procedure. Ask your doctor when your results will be ready.  Keep all follow-up visits as told by your doctor. This is important. Contact a doctor if:  You have very bad cramps that get worse or do not get better with medicine.  You have very bad pain in your belly (abdomen).  You cannot drink fluids without throwing up (vomiting).  You get pain in a different part of the area between your belly and thighs (pelvis).  You have bad-smelling discharge from your vagina.  You have a rash. Get help right away if:  You are bleeding a lot from your vagina. A lot of bleeding means soaking more than one sanitary pad in an hour, for 2 hours in a row.  You have clumps of blood (blood clots) coming from your vagina.  You have a fever or chills.  Your belly feels very tender or hard.  You have chest pain.  You have trouble breathing.  You cough up blood.  You feel dizzy.  You feel light-headed.  You pass out (faint).  You have pain in your neck or shoulder area. Summary  Take short walks often, followed by rest periods. Ask your doctor what activities are safe for you. After one or two days, you may be able to return to your normal activities.  Do not lift anything that is heavier than 10 lb (4.5 kg) until your doctor approves.  Do not take baths, swim, or use a hot tub until your doctor approves.  Take showers instead of baths.  Contact your doctor if you have any symptoms of infection, like bad-smelling discharge from your vagina. This information is not  intended to replace advice given to you by your health care provider. Make sure you discuss any questions you have with your health care provider. Document Released: 12/29/2007 Document Revised: 12/07/2015 Document Reviewed: 12/07/2015 Elsevier Interactive Patient Education  2017 Elsevier Inc.  General Anesthesia, Adult General anesthesia is the use of medicines to make a person "go to sleep" (be unconscious) for a medical procedure. General anesthesia is often recommended when a procedure:  Is long.  Requires you to be still or in an unusual position.  Is major and can cause you to lose blood.  Is impossible to do without general anesthesia.  The medicines used for general anesthesia are called general anesthetics. In addition to making you sleep, the medicines:  Prevent pain.  Control your blood pressure.  Relax your muscles.  Tell a health care provider about:  Any allergies you have.  All medicines you are taking, including vitamins, herbs, eye drops, creams, and over-the-counter medicines.  Any problems you or family members have had with anesthetic medicines.  Types of anesthetics you have had in the past.  Any bleeding disorders you have.  Any surgeries you have had.  Any medical conditions you have.  Any history of heart or lung conditions, such as heart failure, sleep apnea, or chronic obstructive pulmonary disease (COPD).  Whether you are pregnant or may be pregnant.  Whether you use tobacco, alcohol, marijuana, or street drugs.  Any history of Financial planner.  Any history of depression or anxiety. What are the risks? Generally, this is a safe procedure. However, problems may occur, including:  Allergic reaction to anesthetics.  Lung and heart problems.  Inhaling food or liquids from your stomach into your lungs (aspiration).  Injury to nerves.  Waking up during your procedure and being unable to move (rare).  Extreme agitation or a state of  mental confusion (delirium) when you wake up from the anesthetic.  Air in the bloodstream, which can lead to stroke.  These problems are more likely to develop if you are having a major surgery or if you have an advanced medical condition. You can prevent some of these complications by answering all of your health care provider's questions thoroughly and by following all pre-procedure instructions. General anesthesia can cause side effects, including:  Nausea or vomiting  A sore throat from the breathing tube.  Feeling cold or shivery.  Feeling tired, washed out, or achy.  Sleepiness or drowsiness.  Confusion or agitation.  What happens before the procedure? Staying hydrated Follow instructions from your health care provider about hydration, which may include:  Up to 2 hours before the procedure - you may continue to drink clear liquids, such as water, clear fruit juice, black coffee, and plain tea.  Eating and drinking restrictions Follow instructions from your health care provider about eating and drinking, which may include:  8 hours before the procedure - stop eating heavy meals or foods such as meat, fried foods, or fatty foods.  6 hours before the procedure - stop eating light meals or foods, such as toast or cereal.  6 hours before the procedure - stop drinking milk or drinks that contain milk.  2 hours before the procedure - stop drinking clear liquids.  Medicines  Ask your health care provider about: ? Changing or stopping your regular medicines. This is especially important  if you are taking diabetes medicines or blood thinners. ? Taking medicines such as aspirin and ibuprofen. These medicines can thin your blood. Do not take these medicines before your procedure if your health care provider instructs you not to. ? Taking new dietary supplements or medicines. Do not take these during the week before your procedure unless your health care provider approves  them.  If you are told to take a medicine or to continue taking a medicine on the day of the procedure, take the medicine with sips of water. General instructions   Ask if you will be going home the same day, the following day, or after a longer hospital stay. ? Plan to have someone take you home. ? Plan to have someone stay with you for the first 24 hours after you leave the hospital or clinic.  For 3-6 weeks before the procedure, try not to use any tobacco products, such as cigarettes, chewing tobacco, and e-cigarettes.  You may brush your teeth on the morning of the procedure, but make sure to spit out the toothpaste. What happens during the procedure?  You will be given anesthetics through a mask and through an IV tube in one of your veins.  You may receive medicine to help you relax (sedative).  As soon as you are asleep, a breathing tube may be used to help you breathe.  An anesthesia specialist will stay with you throughout the procedure. He or she will help keep you comfortable and safe by continuing to give you medicines and adjusting the amount of medicine that you get. He or she will also watch your blood pressure, pulse, and oxygen levels to make sure that the anesthetics do not cause any problems.  If a breathing tube was used to help you breathe, it will be removed before you wake up. The procedure may vary among health care providers and hospitals. What happens after the procedure?  You will wake up, often slowly, after the procedure is complete, usually in a recovery area.  Your blood pressure, heart rate, breathing rate, and blood oxygen level will be monitored until the medicines you were given have worn off.  You may be given medicine to help you calm down if you feel anxious or agitated.  If you will be going home the same day, your health care provider may check to make sure you can stand, drink, and urinate.  Your health care providers will treat your pain and  side effects before you go home.  Do not drive for 24 hours if you received a sedative.  You may: ? Feel nauseous and vomit. ? Have a sore throat. ? Have mental slowness. ? Feel cold or shivery. ? Feel sleepy. ? Feel tired. ? Feel sore or achy, even in parts of your body where you did not have surgery. This information is not intended to replace advice given to you by your health care provider. Make sure you discuss any questions you have with your health care provider. Document Released: 06/28/2007 Document Revised: 09/01/2015 Document Reviewed: 03/05/2015 Elsevier Interactive Patient Education  2018 [employer]. General Anesthesia, Adult, Care After These instructions provide you with information about caring for yourself after your procedure. Your health care provider may also give you more specific instructions. Your treatment has been planned according to current medical practices, but problems sometimes occur. Call your health care provider if you have any problems or questions after your procedure. What can I expect after the procedure? After the  procedure, it is common to have:  Vomiting.  A sore throat.  Mental slowness.  It is common to feel:  Nauseous.  Cold or shivery.  Sleepy.  Tired.  Sore or achy, even in parts of your body where you did not have surgery.  Follow these instructions at home: For at least 24 hours after the procedure:  Do not: ? Participate in activities where you could fall or become injured. ? Drive. ? Use heavy machinery. ? Drink alcohol. ? Take sleeping pills or medicines that cause drowsiness. ? Make important decisions or sign legal documents. ? Take care of children on your own.  Rest. Eating and drinking  If you vomit, drink water, juice, or soup when you can drink without vomiting.  Drink enough fluid to keep your urine clear or pale yellow.  Make sure you have little or no nausea before eating solid foods.  Follow  the diet recommended by your health care provider. General instructions  Have a responsible adult stay with you until you are awake and alert.  Return to your normal activities as told by your health care provider. Ask your health care provider what activities are safe for you.  Take over-the-counter and prescription medicines only as told by your health care provider.  If you smoke, do not smoke without supervision.  Keep all follow-up visits as told by your health care provider. This is important. Contact a health care provider if:  You continue to have nausea or vomiting at home, and medicines are not helpful.  You cannot drink fluids or start eating again.  You cannot urinate after 8-12 hours.  You develop a skin rash.  You have fever.  You have increasing redness at the site of your procedure. Get help right away if:  You have difficulty breathing.  You have chest pain.  You have unexpected bleeding.  You feel that you are having a life-threatening or urgent problem. This information is not intended to replace advice given to you by your health care provider. Make sure you discuss any questions you have with your health care provider. Document Released: 06/27/2000 Document Revised: 08/24/2015 Document Reviewed: 03/05/2015 Elsevier Interactive Patient Education  [employer].

## 2016-11-01 ENCOUNTER — Encounter (HOSPITAL_COMMUNITY): Payer: Self-pay | Admitting: Emergency Medicine

## 2016-11-01 ENCOUNTER — Emergency Department (HOSPITAL_COMMUNITY)
Admission: EM | Admit: 2016-11-01 | Discharge: 2016-11-01 | Disposition: A | Discharge status: Home / Self Care | Payer: BLUE CROSS/BLUE SHIELD | Attending: Emergency Medicine | Admitting: Emergency Medicine

## 2016-11-01 DIAGNOSIS — E1165 Type 2 diabetes mellitus with hyperglycemia: Secondary | ICD-10-CM | POA: Diagnosis not present

## 2016-11-01 DIAGNOSIS — F17210 Nicotine dependence, cigarettes, uncomplicated: Secondary | ICD-10-CM | POA: Diagnosis not present

## 2016-11-01 DIAGNOSIS — E1140 Type 2 diabetes mellitus with diabetic neuropathy, unspecified: Secondary | ICD-10-CM | POA: Insufficient documentation

## 2016-11-01 DIAGNOSIS — Z79899 Other long term (current) drug therapy: Secondary | ICD-10-CM | POA: Insufficient documentation

## 2016-11-01 DIAGNOSIS — J449 Chronic obstructive pulmonary disease, unspecified: Secondary | ICD-10-CM | POA: Insufficient documentation

## 2016-11-01 DIAGNOSIS — Z7982 Long term (current) use of aspirin: Secondary | ICD-10-CM | POA: Diagnosis not present

## 2016-11-01 DIAGNOSIS — R739 Hyperglycemia, unspecified: Secondary | ICD-10-CM

## 2016-11-01 HISTORY — DX: Type 2 diabetes mellitus without complications: E11.9

## 2016-11-01 LAB — BASIC METABOLIC PANEL
Anion gap: 9 (ref 5–15)
BUN: 15 mg/dL (ref 6–20)
CO2: 28 mmol/L (ref 22–32)
Calcium: 9.9 mg/dL (ref 8.9–10.3)
Chloride: 108 mmol/L (ref 101–111)
Creatinine, Ser: 0.71 mg/dL (ref 0.44–1.00)
GFR calc Af Amer: >60 mL/min (ref >60)
GFR calc non Af Amer: >60 mL/min (ref >60)
Glucose, Bld: 116 mg/dL — ABNORMAL HIGH (ref 65–99)
Potassium: 4 mmol/L (ref 3.5–5.1)
Sodium: 145 mmol/L (ref 135–145)

## 2016-11-01 LAB — CBC WITH DIFFERENTIAL/PLATELET
Basophils Absolute: 0 10*3/uL (ref 0.0–0.1)
Basophils Relative: 0 %
Eosinophils Absolute: 0 10*3/uL (ref 0.0–0.7)
Eosinophils Relative: 0 %
HCT: 42.5 % (ref 36.0–46.0)
Hemoglobin: 14.1 g/dL (ref 12.0–15.0)
Lymphocytes Relative: 14 %
Lymphs Abs: 1.7 10*3/uL (ref 0.7–4.0)
MCH: 32.9 pg (ref 26.0–34.0)
MCHC: 33.2 g/dL (ref 30.0–36.0)
MCV: 99.1 fL (ref 78.0–100.0)
Monocytes Absolute: 0.6 10*3/uL (ref 0.1–1.0)
Monocytes Relative: 5 %
Neutro Abs: 9.8 10*3/uL — ABNORMAL HIGH (ref 1.7–7.7)
Neutrophils Relative %: 81 %
Platelets: 282 10*3/uL (ref 150–400)
RBC: 4.29 MIL/uL (ref 3.87–5.11)
RDW: 13.9 % (ref 11.5–15.5)
WBC: 12.1 10*3/uL — ABNORMAL HIGH (ref 4.0–10.5)

## 2016-11-01 LAB — CBG MONITORING, ED: Glucose-Capillary: 124 mg/dL — ABNORMAL HIGH (ref 65–99)

## 2016-11-01 NOTE — ED Provider Notes (Signed)
[facility]-EM[facility]Y DEPT Provider Note   CSN: [id number] Arrival date & time: 11/01/16  1739     History   Chief Complaint Chief Complaint  Patient presents with  . Hyperglycemia    HPI Gina Bradley is a 56 y.o. female.  Patient states that her sugar has been running high lately. She's been getting sugars in the 200s. She is supposedly prediabetic.   The history is provided by the patient.  Hyperglycemia  Blood sugar level PTA:  200 Severity:  Mild Onset quality:  Sudden Timing:  Constant Progression:  Waxing and waning Chronicity:  New Diabetes status:  Non-diabetic Associated symptoms: no abdominal pain, no chest pain and no fatigue     Past Medical History:  Diagnosis Date  . Anemia   . Cervical disc disease   . COPD (chronic obstructive pulmonary disease) (HCC)   . Diabetes mellitus without complication (HCC)   . Essential hypertension   . GERD (gastroesophageal reflux disease)   . History of cardiac catheterization    Normal coronaries March 2015  . History of non-ST elevation myocardial infarction (NSTEMI)    Secondary to SVT  . Peripheral neuropathy   . Spinal stenosis   . SVT (supraventricular tachycardia) [facility](HCC)     Patient Active Problem List   Diagnosis Date Noted  . PMB (postmenopausal bleeding) 10/06/2016  . Chest pressure 01/05/2015  . Gastric erosion   . Encounter for screening colonoscopy 01/22/2014  . Constipation 01/20/2014  . GERD (gastroesophageal reflux disease) 01/20/2014  . Dyspepsia 01/20/2014  . SVT (supraventricular tachycardia) (HCC) 05/31/2013  . HTN (hypertension), benign 05/31/2013  . Hypercholesteremia 05/31/2013  . COPD (chronic obstructive pulmonary disease) (HCC) 05/31/2013  . Cervical stenosis of spine 05/31/2013  . Peripheral neuropathy 05/31/2013    Past Surgical History:  Procedure Laterality Date  . CESAREAN SECTION    . ESOPHAGOGASTRODUODENOSCOPY N/A 02/10/2014   MVH:QIONGERMR:Single tiny antral erosion; otherwise normal  EGD. No explanation for patient's symptoms. Gallbladder needs further evaluation.  [name]. LEFT HEART CATHETERIZATION WITH CORONARY ANGIOGRAM N/A 06/03/2013   Procedure: LEFT HEART CATHETERIZATION WITH CORONARY ANGIOGRAM;  Surgeon: Lesleigh NoeHenry W Smith III, MD;  [facility];  Service: Cardiovascular;  Laterality: N/A;    OB History    Gravida Para Term Preterm AB Living   2 2 2     2    SAB TAB Ectopic Multiple Live Births           2       Home Medications    Prior to Admission medications   Medication Sig Start Date End Date Taking? Authorizing Provider  albuterol (PROVENTIL HFA;VENTOLIN HFA) 108 (90 Base) MCG/ACT inhaler Inhale into the lungs every 6 (six) hours as needed for wheezing or shortness of breath.   Yes [provider]  albuterol (PROVENTIL) (2.5 MG/3ML) 0.083% nebulizer solution Take 2.5 mg by nebulization 4 (four) times daily.   Yes [provider]  ALPRAZolam Prudy Feeler(XANAX) 1 MG tablet Take 1 mg by mouth 3 (three) times daily as needed for anxiety or sleep. Anxiety 05/03/13  Yes [provider]  ANORO ELLIPTA 62.5-25 MCG/INH AEPB Inhale 1 puff into the lungs daily.  09/09/16  Yes [provider]  aspirin EC 81 MG EC tablet Take 1 tablet (81 mg total) by mouth daily. 06/03/13  Yes Lonia BloodMcClung, Jeffrey T, MD  atorvastatin (LIPITOR) 20 MG tablet Take 20 mg by mouth every evening.  03/04/13  Yes [provider]  co-enzyme Q-10 30 MG capsule Take 100  mg by mouth daily.   Yes [provider]  cyclobenzaprine (FLEXERIL) 10 MG tablet Take 10 mg by mouth at bedtime as needed.  08/30/16  Yes [provider]  EPINEPHrine (EPIPEN 2-PAK) 0.3 mg/0.3 mL IJ SOAJ injection Inject 0.3 mLs (0.3 mg total) into the muscle once as needed (for severe allergic reaction). CAll 911 immediately if you have to use this medicine 10/21/15  Yes Eber Hong, MD  furosemide (LASIX) 20 MG tablet Take 1 tablet (20 mg total) by mouth 2 (two) times daily. Patient  taking differently: Take 20 mg by mouth every evening.  05/26/16  Yes Marinus Maw, MD  gabapentin (NEURONTIN) 100 MG capsule Take 100 mg by mouth 3 (three) times daily. 05/25/13  Yes [provider]  HYDROcodone-acetaminophen (NORCO) 10-325 MG per tablet Take 1 tablet by mouth 4 (four) times daily. pain 05/03/13  Yes [provider]  metoprolol tartrate (LOPRESSOR) 25 MG tablet TAKE (1) TABLET BY MOUTH TWICE DAILY. Patient taking differently: Take 25 mg by mouth 3 (three) times daily.  05/26/16  Yes Marinus Maw, MD  nitroGLYCERIN (NITROSTAT) 0.4 MG SL tablet Place 1 tablet (0.4 mg total) under the tongue every 5 (five) minutes as needed for chest pain. 12/22/14  Yes Rolland Porter, MD  Oxycodone HCl 10 MG TABS Take 5-10 mg by mouth daily as needed (for pain).  09/06/16  Yes [provider]  pantoprazole (PROTONIX) 40 MG tablet Take 40 mg by mouth 2 (two) times daily. 05/25/13  Yes [provider]  promethazine (PHENERGAN) 25 MG tablet Take 25 mg by mouth every 6 (six) hours as needed for nausea or vomiting.   Yes [provider]  TOVIAZ 4 MG TB24 tablet Take 4 mg by mouth daily.  09/21/16  Yes [provider]  triamcinolone cream (KENALOG) 0.1 % Apply 1 application topically as needed (ITCHING/IRRITATION).  04/28/15  Yes [provider]  VIT B6-VIT B12-OMEGA 3 ACIDS PO Take 500 mg by mouth daily.   Yes [provider]    Family History Family History  Problem Relation Age of Onset  . Heart attack Mother   . Diabetes Mother   . Hypertension Mother   . Angina Mother   . Diabetes Father   . Hypertension Father   . Heart failure Brother   . Suicidality Sister   . Colon cancer Unknown        Possibly Dad   . Stroke Maternal Grandmother   . Heart failure Maternal Grandmother   . Diabetes Maternal Grandfather   . Hypertension Daughter   . Hypertension Son     Social History Social History  Substance Use Topics  . Smoking  status: Current Every Day Smoker    Packs/day: 0.50    Years: 18.00    Types: Cigarettes  . Smokeless tobacco: Never Used  . Alcohol use 0.0 oz/week     Comment: rarely     Allergies   Bee venom and Tdap [diphth-acell pertussis-tetanus]   Review of Systems Review of Systems  Constitutional: Negative for appetite change and fatigue.  HENT: Negative for congestion, ear discharge and sinus pressure.   Eyes: Negative for discharge.  Respiratory: Negative for cough.   Cardiovascular: Negative for chest pain.  Gastrointestinal: Negative for abdominal pain and diarrhea.  Genitourinary: Negative for frequency and hematuria.  Musculoskeletal: Negative for back pain.  Skin: Negative for rash.  Neurological: Negative for seizures and headaches.  Psychiatric/Behavioral: Negative for hallucinations.  Physical Exam Updated Vital Signs BP (!) 141/77 (BP Location: Left Arm)   Pulse 87   Temp 98.3 F (36.8 C) (Oral)   Resp 18   Ht 5\' 1"  (1.549 m)   Wt 88.9 kg (196 lb)   LMP 01/05/2015   SpO2 100%   BMI 37.03 kg/m   Physical Exam  Constitutional: She is oriented to person, place, and time. She appears well-developed.  HENT:  Head: Normocephalic.  Eyes: Conjunctivae and EOM are normal. No scleral icterus.  Neck: Neck supple. No thyromegaly present.  Cardiovascular: Normal rate and regular rhythm.  Exam reveals no gallop and no friction rub.   No murmur heard. Pulmonary/Chest: No stridor. She has no wheezes. She has no rales. She exhibits no tenderness.  Abdominal: She exhibits no distension. There is no tenderness. There is no rebound.  Musculoskeletal: Normal range of motion. She exhibits no edema.  Lymphadenopathy:    She has no cervical adenopathy.  Neurological: She is oriented to person, place, and time. She exhibits normal muscle tone. Coordination normal.  Skin: No rash noted. No erythema.  Psychiatric: She has a normal mood and affect. Her behavior is normal.      ED Treatments / Results  Labs (all labs ordered are listed, but only abnormal results are displayed) Labs Reviewed  CBC WITH DIFFERENTIAL/PLATELET - Abnormal; Notable for the following:       Result Value   WBC 12.1 (*)    Neutro Abs 9.8 (*)    All other components within normal limits  BASIC METABOLIC PANEL - Abnormal; Notable for the following:    Glucose, Bld 116 (*)    All other components within normal limits  CBG MONITORING, ED - Abnormal; Notable for the following:    Glucose-Capillary 124 (*)    All other components within normal limits  HEMOGLOBIN A1C    EKG  EKG Interpretation None       Radiology No results found.  Procedures Procedures (including critical care time)  Medications Ordered in ED Medications - No data to display   Initial Impression / Assessment and Plan / ED Course  I have reviewed the triage vital signs and the nursing notes.  Pertinent labs & imaging results that were available during my care of the patient were reviewed by me and considered in my medical decision making (see chart for details).    Patient's blood glucose was 116 here. We have gotten a hemoglobin A1c on her  Final Clinical Impressions(s) / ED Diagnoses   Final diagnoses:  Hyperglycemia    New Prescriptions New Prescriptions   No medications on file     Bethann BerkshireZammit, Rochelle Nephew, MD 11/01/16 2014

## 2016-11-01 NOTE — Discharge Instructions (Signed)
Follow up with your md as scheduled this week. °

## 2016-11-01 NOTE — ED Notes (Signed)
Pt left prior to signing dc papers  

## 2016-11-01 NOTE — ED Triage Notes (Signed)
Pt states was borderline dm. Checked bs earlier and was 253. C/o being shaky and sweaty and confused/dizzy today. Dry mouth per pt. C/o gen weakness.

## 2016-11-02 ENCOUNTER — Encounter (HOSPITAL_COMMUNITY)
Admission: RE | Admit: 2016-11-02 | Discharge: 2016-11-02 | Disposition: A | Discharge status: Home / Self Care | Payer: BLUE CROSS/BLUE SHIELD | Source: Ambulatory Visit | Attending: Obstetrics and Gynecology | Admitting: Obstetrics and Gynecology

## 2016-11-02 ENCOUNTER — Other Ambulatory Visit: Payer: Self-pay | Admitting: Obstetrics and Gynecology

## 2016-11-02 LAB — HEMOGLOBIN A1C
Hgb A1c MFr Bld: 6 % — ABNORMAL HIGH (ref 4.8–5.6)
Mean Plasma Glucose: 126 mg/dL

## 2016-11-03 ENCOUNTER — Encounter (HOSPITAL_COMMUNITY): Payer: Self-pay

## 2016-11-03 ENCOUNTER — Encounter (HOSPITAL_COMMUNITY)
Admission: RE | Admit: 2016-11-03 | Discharge: 2016-11-03 | Disposition: A | Discharge status: Home / Self Care | Payer: BLUE CROSS/BLUE SHIELD | Source: Ambulatory Visit | Attending: Obstetrics and Gynecology | Admitting: Obstetrics and Gynecology

## 2016-11-03 ENCOUNTER — Other Ambulatory Visit: Payer: Self-pay

## 2016-11-03 DIAGNOSIS — N950 Postmenopausal bleeding: Secondary | ICD-10-CM | POA: Diagnosis not present

## 2016-11-03 DIAGNOSIS — N840 Polyp of corpus uteri: Secondary | ICD-10-CM | POA: Insufficient documentation

## 2016-11-03 LAB — PREGNANCY, URINE: Preg Test, Ur: NEGATIVE

## 2016-11-04 ENCOUNTER — Ambulatory Visit: Payer: BLUE CROSS/BLUE SHIELD | Admitting: Internal Medicine

## 2016-11-07 ENCOUNTER — Other Ambulatory Visit (HOSPITAL_COMMUNITY): Payer: Self-pay | Admitting: Internal Medicine

## 2016-11-07 ENCOUNTER — Ambulatory Visit (HOSPITAL_COMMUNITY)
Admission: RE | Admit: 2016-11-07 | Discharge: 2016-11-07 | Disposition: A | Discharge status: Home / Self Care | Payer: BLUE CROSS/BLUE SHIELD | Source: Ambulatory Visit | Attending: Internal Medicine | Admitting: Internal Medicine

## 2016-11-07 ENCOUNTER — Encounter (HOSPITAL_COMMUNITY): Payer: Self-pay | Admitting: Anesthesiology

## 2016-11-07 DIAGNOSIS — R079 Chest pain, unspecified: Secondary | ICD-10-CM | POA: Insufficient documentation

## 2016-11-08 ENCOUNTER — Encounter (HOSPITAL_COMMUNITY): Admission: RE | Payer: Self-pay | Source: Ambulatory Visit

## 2016-11-08 ENCOUNTER — Telehealth: Payer: Self-pay | Admitting: Obstetrics and Gynecology

## 2016-11-08 ENCOUNTER — Ambulatory Visit (HOSPITAL_COMMUNITY)
Admission: RE | Admit: 2016-11-08 | Payer: BLUE CROSS/BLUE SHIELD | Source: Ambulatory Visit | Admitting: Obstetrics and Gynecology

## 2016-11-08 SURGERY — DILATATION AND CURETTAGE /HYSTEROSCOPY
Anesthesia: General

## 2016-11-08 MED ORDER — BUPIVACAINE-EPINEPHRINE (PF) 0.5% -1:200000 IJ SOLN
INTRAMUSCULAR | Status: AC
  Filled 2016-11-08: qty 30

## 2016-11-08 NOTE — H&P (Signed)
CC & HPI:  Gina Bradley is a 56 y.o. female presenting today for an U/S referencing her single episode PMB. Pt has no other complaints or symptoms. She wants to discuss hormone therapy for her declining sex drive and mood irritation.  ROS:  ROS -fever -abdominal pain +mood irritation All systems are negative except as noted in the HPI and PMH.    Pertinent History Reviewed:   Reviewed: no GYN surgeries Medical             Past Medical History:  Diagnosis Date  . Anemia   . Cervical disc disease   . COPD (chronic obstructive pulmonary disease) (HCC)   . Essential hypertension   . GERD (gastroesophageal reflux disease)   . History of cardiac catheterization    Normal coronaries March 2015  . History of non-ST elevation myocardial infarction (NSTEMI)    Secondary to SVT  . Peripheral neuropathy   . Spinal stenosis   . SVT (supraventricular tachycardia) (HCC)                               Surgical Hx:         Past Surgical History:  Procedure Laterality Date  . CESAREAN SECTION    . ESOPHAGOGASTRODUODENOSCOPY N/A 02/10/2014   ZOX:[id number]:Single tiny antral erosion; otherwise normal EGD. No explanation for patient's symptoms. Gallbladder needs further evaluation.  [name]. LEFT HEART CATHETERIZATION WITH CORONARY ANGIOGRAM N/A 06/03/2013   Procedure: LEFT HEART CATHETERIZATION WITH CORONARY ANGIOGRAM;  Surgeon: Lesleigh NoeHenry W Smith III, MD;  [facility];  Service: Cardiovascular;  Laterality: N/A;   Medications: Reviewed & Updated - see associated section                       Current Outpatient Prescriptions:  .  albuterol (PROVENTIL HFA;VENTOLIN HFA) 108 (90 Base) MCG/ACT inhaler, Inhale into the lungs every 6 (six) hours as needed for wheezing or shortness of breath., Disp: , Rfl:  .  ALPRAZolam (XANAX) 1 MG tablet, Take 1 mg by mouth at bedtime as needed for anxiety or sleep. Anxiety, Disp: , Rfl:  .  ANORO ELLIPTA 62.5-25 MCG/INH AEPB, Inhale 1 puff into the  lungs daily. , Disp: , Rfl: 11 .  aspirin EC 81 MG EC tablet, Take 1 tablet (81 mg total) by mouth daily., Disp: , Rfl:  .  atorvastatin (LIPITOR) 20 MG tablet, Take 20 mg by mouth every evening. , Disp: , Rfl:  .  co-enzyme Q-10 30 MG capsule, Take 100 mg by mouth daily., Disp: , Rfl:  .  cyclobenzaprine (FLEXERIL) 10 MG tablet, Take 10 mg by mouth as needed. , Disp: , Rfl: 3 .  EPINEPHrine (EPIPEN 2-PAK) 0.3 mg/0.3 mL IJ SOAJ injection, Inject 0.3 mLs (0.3 mg total) into the muscle once as needed (for severe allergic reaction). CAll 911 immediately if you have to use this medicine, Disp: 2 Device, Rfl: 1 .  furosemide (LASIX) 20 MG tablet, Take 1 tablet (20 mg total) by mouth 2 (two) times daily., Disp: 60 tablet, Rfl: 11 .  gabapentin (NEURONTIN) 100 MG capsule, Take 100 mg by mouth 3 (three) times daily., Disp: , Rfl:  .  HYDROcodone-acetaminophen (NORCO) 10-325 MG per tablet, Take 1 tablet by mouth 4 (four) times daily. pain, Disp: , Rfl:  .  metoprolol tartrate (LOPRESSOR) 25 MG tablet, TAKE (1) TABLET BY MOUTH TWICE DAILY., Disp: 180 tablet, Rfl: 3 .  nitroGLYCERIN (NITROSTAT) 0.4 MG SL tablet, Place 1 tablet (0.4 mg total) under the tongue every 5 (five) minutes as needed for chest pain., Disp: 10 tablet, Rfl: 0 .  Oxycodone HCl 10 MG TABS, 10 mg as needed. , Disp: , Rfl: 0 .  pantoprazole (PROTONIX) 40 MG tablet, Take 40 mg by mouth 2 (two) times daily., Disp: , Rfl:  .  promethazine (PHENERGAN) 25 MG tablet, Take 25 mg by mouth every 6 (six) hours as needed for nausea or vomiting., Disp: , Rfl:  .  TOVIAZ 4 MG TB24 tablet, Take 4 mg by mouth daily. , Disp: , Rfl: 10 .  triamcinolone cream (KENALOG) 0.1 %, Apply 1 application topically as needed. , Disp: , Rfl: 2 .  VIT B6-VIT B12-OMEGA 3 ACIDS PO, Take 500 mg by mouth daily., Disp: , Rfl:    Social History: Reviewed -  reports that she has been smoking Cigarettes.  She has a 9.00 pack-year smoking history. She has never used smokeless  tobacco.  Objective Findings:  Vitals: Last menstrual period 01/05/2015.  Physical Examination:  General appearance - alert, well appearing, and in no distress Mental status - alert, oriented to person, place, and time Chest - clear to auscultation, no wheezes, rales or rhonchi, symmetric air entry Heart - normal rate, regular rhythm, normal S1, S2, no murmurs, rubs, clicks or gallops Pelvic - not indicated GYNECOLOGIC SONOGRAM   Gina Bradley is a 56 y.o. G2P2002 she is here for a pelvic sonogram for postmenopausal bleeding.  Uterus                      8.5 x 4.6 x 5.1 cm, heterogeneous anteverted uterus  Endometrium          6.7 mm, symmetrical, thickened ,complex fluid filled endometrium w/color flow  Right ovary             2.7 x 2.4 x 1.8 cm, wnl limited view   Left ovary                2.7 x 1.9 x 2.1 cm, wnl limited view  No free fluid,mult nabothian cysts  Technician Comments:  PELVIC US TA/TV: heterogeneous anteverted uterus,thickened, complex fluid filled endometrium w/color flow,EEC 6.7 mm,normal ovaries bilat,no free fluid,limited view of endometrium and ovaries,mult.nabothian cysts    Karie Chimera 10/17/2016 2:56 PM  Clinical Impression and recommendations:  I have reviewed the sonogram results above.  Combined with the patient's current clinical course, below are my impressions and any appropriate recommendations for management based on the sonographic findings:  1. Small uterus with slightly thickened endomtrium, and heterogenous endometrial tissue character  2 Endometrial biopsy recommended. 3 normal ovaries bilateral.  Wray Goehring V  Assessment & Plan:   A:  1. Endometrial polyp  P:  1. Hysteroscopy, dilation and curettage, removal of endometrial polyp Through day surgery and [facility]     By signing my name below, I, Redge Gainer, attest that this documentation has been prepared under the direction and in  the presence of Tilda Burrow, MD. Electronically Signed: Redge Gainer, ED Scribe. 10/17/16. 2:48 PM.  I personally performed the services described in this documentation, which was SCRIBED in my presence. The recorded information has been reviewed and considered accurate. It has been edited as necessary during review.    Progress Notes by Redge Gainer at 10/17/2016 2:45 PM

## 2016-11-08 NOTE — Telephone Encounter (Signed)
Patient called stating that Dr. Emelda FearFerguson was suppose to call her in some medication to her pharmacy. I let patient know that he is not in the office today and wont be in until tomorrow. Please contact pt

## 2016-11-08 NOTE — OR Nursing (Signed)
Patient was on schedule to have surgery on 11/08/2016. Developed change in health status after preop visit. Complaints of numbness to both arms . States she had a mass to pop up on chest area. Dr. Sherwood GamblerFusco ordered  A CXR that was performed on 11/07/2016. Dr. Emelda FearFerguson and Dr. Jayme CloudGonzalez notified.  Surgery cancelled for now. I called office spoke with Noralee StainAngie Petty, she is aware of cancellation and reason and expecting more information from Dr. Emelda FearFerguson. Also called patient to let her no that her surgery has been cancelled for today and that the office will be contacting her for further instructions.

## 2016-11-10 ENCOUNTER — Other Ambulatory Visit (HOSPITAL_BASED_OUTPATIENT_CLINIC_OR_DEPARTMENT_OTHER): Payer: Self-pay

## 2016-11-10 ENCOUNTER — Telehealth: Payer: Self-pay | Admitting: Internal Medicine

## 2016-11-10 DIAGNOSIS — R5383 Other fatigue: Secondary | ICD-10-CM

## 2016-11-10 DIAGNOSIS — G4733 Obstructive sleep apnea (adult) (pediatric): Secondary | ICD-10-CM

## 2016-11-10 NOTE — Telephone Encounter (Signed)
error 

## 2016-11-14 ENCOUNTER — Telehealth: Payer: Self-pay | Admitting: Obstetrics and Gynecology

## 2016-11-15 NOTE — Telephone Encounter (Signed)
Pt having cardiology workup and will make a f/u appt with me after that is completed.

## 2016-11-16 ENCOUNTER — Encounter: Payer: BLUE CROSS/BLUE SHIELD | Admitting: Obstetrics and Gynecology

## 2016-11-25 ENCOUNTER — Ambulatory Visit (INDEPENDENT_AMBULATORY_CARE_PROVIDER_SITE_OTHER): Payer: BLUE CROSS/BLUE SHIELD | Admitting: Adult Health

## 2016-11-25 ENCOUNTER — Encounter: Payer: Self-pay | Admitting: Adult Health

## 2016-11-25 VITALS — BP 136/74 | HR 98 | Ht 61.0 in | Wt 202.4 lb

## 2016-11-25 DIAGNOSIS — I10 Essential (primary) hypertension: Secondary | ICD-10-CM | POA: Diagnosis not present

## 2016-11-25 DIAGNOSIS — I471 Supraventricular tachycardia: Secondary | ICD-10-CM

## 2016-11-25 DIAGNOSIS — E7800 Pure hypercholesterolemia, unspecified: Secondary | ICD-10-CM

## 2016-11-25 DIAGNOSIS — R109 Unspecified abdominal pain: Secondary | ICD-10-CM

## 2016-11-25 MED ORDER — METOPROLOL SUCCINATE ER 100 MG PO TB24
100.0000 mg | ORAL_TABLET | Freq: Every day | ORAL | 11 refills | Status: DC
Start: 2016-11-25 — End: 2017-10-28

## 2016-11-25 NOTE — Patient Instructions (Signed)
Medication Instructions:  Your physician recommends that you continue on your current medications as directed. Please refer to the Current Medication list given to you today.   Labwork: NONE  Testing/Procedures: Your physician has requested that you have an echocardiogram. Echocardiography is a painless test that uses sound waves to create images of your heart. It provides your doctor with information about the size and shape of your heart and how well your heart's chambers and valves are working. This procedure takes approximately one hour. There are no restrictions for this procedure.    Follow-Up: Your physician recommends that you schedule a follow-up appointment in: 1 Month   Any Other Special Instructions Will Be Listed Below (If Applicable).  Take Gas-X With Every Meal    If you need a refill on your cardiac medications before your next appointment, please call your pharmacy.  Thank you for choosing [facility]!

## 2016-11-25 NOTE — Progress Notes (Signed)
Cardiology Office Note   Date:  11/25/2016   ID:  Gina Bradley, DOB Aug 15, 1960, MRN [id number]  PCP:  Elfredia Nevins, MD  Cardiologist:  [place] No chief complaint on file.     History of Present Illness: Gina Bradley is a 56 y.o. female who presents for ongoing assessment and management for PSVT, with most recent office visit with Dr. Diona Browner on 04/21/2015 at which time she was added to his schedule for discussion about chest pain, heart failure, and angina. At that time she complained of intermittent feelings of rapid heart rhythm associated with upper chest and left shoulder discomfort occurring sporadically.  Patient's other history includes COPD, hypertension, anemia, GERD, and peripheral neuropathy. She had a normal cardiac catheterization in 2015. On that office visit she was recommended to increase Lopressor to 25 mg twice a day and follow-up with Dr. Ladona Ridgel. She was seen by Dr. Ladona Ridgel in 05/26/2016 and it had good response to increasing metoprolol dose. She still complained of having rapid palpitations on occasion and was advised to takes Xanax. At the time of that office visit she had no indication for  RFA, and therefore this was not planned.  She comes today with multiple somatic complaints. Numbness and tingling in her arms, abdominal pain and distention, dyspnea, she thinks that she needs stents. She is recently lost her job and has been having a lot of anxiety, she also is complaining of rapid heart rhythm. She is to be on metoprolol 25 mg twice a day but has been taking it 3 times a day because she feels her heart rate go up. She states that she often has to take an extra dose in the middle of the day. She also complaints of recurrent chest pain. She has been taking isosorbide 10 mg daily which is not on her medication list and had been provided to her in the past by her primary care physician.  She is due to follow-up with GI next month, and is also been referred to  [facility] further testing by primary care physician.   Past Medical History:  Diagnosis Date  . Anemia   . Cervical disc disease   . COPD (chronic obstructive pulmonary disease) (HCC)   . Diabetes mellitus without complication (HCC)   . Essential hypertension   . GERD (gastroesophageal reflux disease)   . History of cardiac catheterization    Normal coronaries March 2015  . History of non-ST elevation myocardial infarction (NSTEMI)    Secondary to SVT  . Peripheral neuropathy   . Spinal stenosis   . SVT (supraventricular tachycardia) (HCC)     Past Surgical History:  Procedure Laterality Date  . CESAREAN SECTION    . ESOPHAGOGASTRODUODENOSCOPY N/A 02/10/2014   WUJ:[id number] tiny antral erosion; otherwise normal EGD. No explanation for patient's symptoms. Gallbladder needs further evaluation.  [name] LEFT HEART CATHETERIZATION WITH CORONARY ANGIOGRAM N/A 06/03/2013   Procedure: LEFT HEART CATHETERIZATION WITH CORONARY ANGIOGRAM;  Surgeon: Lesleigh Noe, MD;  Location: [facility] CATH LAB;  Service: Cardiovascular;  Laterality: N/A;     Current Outpatient Prescriptions  Medication Sig Dispense Refill  . albuterol (PROVENTIL HFA;VENTOLIN HFA) 108 (90 Base) MCG/ACT inhaler Inhale into the lungs every 6 (six) hours as needed for wheezing or shortness of breath.    [name] albuterol (PROVENTIL) (2.5 MG/3ML) 0.083% nebulizer solution Take 2.5 mg by nebulization 4 (four) times daily.    [name] ALPRAZolam (XANAX) 1 MG tablet Take 1 mg by mouth 3 (three) times daily  as needed for anxiety or sleep. Anxiety    . ANORO ELLIPTA 62.5-25 MCG/INH AEPB Inhale 1 puff into the lungs daily.   11  . aspirin EC 81 MG EC tablet Take 1 tablet (81 mg total) by mouth daily.    [name] atorvastatin (LIPITOR) 20 MG tablet Take 20 mg by mouth every evening.     [name] co-enzyme Q-10 30 MG capsule Take 100 mg by mouth daily.    . cyclobenzaprine (FLEXERIL) 10 MG tablet Take 10 mg by mouth at bedtime as needed.   3  . EPINEPHrine (EPIPEN  2-PAK) 0.3 mg/0.3 mL IJ SOAJ injection Inject 0.3 mLs (0.3 mg total) into the muscle once as needed (for severe allergic reaction). CAll 911 immediately if you have to use this medicine 2 Device 1  . furosemide (LASIX) 20 MG tablet Take 1 tablet (20 mg total) by mouth 2 (two) times daily. (Patient taking differently: Take 20 mg by mouth every evening. ) 60 tablet 11  . gabapentin (NEURONTIN) 100 MG capsule Take 100 mg by mouth 3 (three) times daily.    [name] HYDROcodone-acetaminophen (NORCO) 10-325 MG per tablet Take 1 tablet by mouth 4 (four) times daily. pain    . metoprolol tartrate (LOPRESSOR) 25 MG tablet TAKE (1) TABLET BY MOUTH TWICE DAILY. (Patient taking differently: Take 25 mg by mouth 3 (three) times daily. ) 180 tablet 3  . nitroGLYCERIN (NITROSTAT) 0.4 MG SL tablet Place 1 tablet (0.4 mg total) under the tongue every 5 (five) minutes as needed for chest pain. 10 tablet 0  . Oxycodone HCl 10 MG TABS Take 5-10 mg by mouth daily as needed (for pain).   0  . pantoprazole (PROTONIX) 40 MG tablet Take 40 mg by mouth 2 (two) times daily.    . promethazine (PHENERGAN) 25 MG tablet Take 25 mg by mouth every 6 (six) hours as needed for nausea or vomiting.    . TOVIAZ 4 MG TB24 tablet Take 4 mg by mouth daily.   10  . triamcinolone cream (KENALOG) 0.1 % Apply 1 application topically as needed (ITCHING/IRRITATION).   2  . VIT B6-VIT B12-OMEGA 3 ACIDS PO Take 500 mg by mouth daily.     No current facility-administered medications for this visit.     Allergies:   Bee venom and Tdap [diphth-acell pertussis-tetanus]    Social History:  The patient  reports that she has been smoking Cigarettes.  She has a 9.00 pack-year smoking history. She has never used smokeless tobacco. She reports that she drinks alcohol. She reports that she does not use drugs.   Family History:  The patient's family history includes Angina in her mother; Colon cancer in her unknown relative; Diabetes in her father, maternal  grandfather, and mother; Heart attack in her mother; Heart failure in her brother and maternal grandmother; Hypertension in her daughter, father, mother, and son; Stroke in her maternal grandmother; Suicidality in her sister.    ROS: All other systems are reviewed and negative. Unless otherwise mentioned in H&P    PHYSICAL EXAM: VS:  LMP 01/05/2015  , BMI There is no height or weight on file to calculate BMI. GEN: Well nourished, well developed, in no acute distress  HEENT: normal  Neck: no JVD, carotid bruits, or masses Cardiac: RRR, tachycardic; no murmurs, rubs, or gallops,no edema  Respiratory:  clear to auscultation bilaterally, normal work of breathing GI: soft, obese, nontender, nondistended, + BS MS: no deformity or atrophy  Skin: warm and dry,  no rash Neuro:  Strength and sensation are intact Psych: euthymic mood, full affect   Recent Labs: 11/01/2016: BUN 15; Creatinine, Ser 0.71; Hemoglobin 14.1; Platelets 282; Potassium 4.0; Sodium 145    Lipid Panel    Component Value Date/Time   CHOL 204 (H) 06/01/2013 0438   TRIG 60 06/01/2013 0438   HDL 76 06/01/2013 0438   CHOLHDL 2.7 06/01/2013 0438   VLDL 12 06/01/2013 0438   LDLCALC 116 (H) 06/01/2013 0438      Wt Readings from Last 3 Encounters:  11/01/16 196 lb (88.9 kg)  10/17/16 196 lb (88.9 kg)  10/06/16 197 lb 12.8 oz (89.7 kg)      Other studies Reviewed:  NM Stress Test  Study Result    There was no ST segment deviation noted during stress.  The study is normal.  This is a low risk study.  The left ventricular ejection fraction is hyperdynamic (>65%).   Echocardiogram 06/01/2013 Study Conclusions  - Left ventricle: The cavity size was normal. Wall thickness was normal. Systolic function was vigorous. The estimated ejection fraction was in the range of 65% to 70%. Wall motion was normal; there were no regional wall motion abnormalities. Doppler parameters are consistent with abnormal  left ventricular relaxation (grade 1 diastolic dysfunction). - Right atrium: Central venous pressure: 8mm Hg (est). - Tricuspid valve: Mild regurgitation. - Pulmonary arteries: PA peak pressure: 24mm Hg (S). - Pericardium, extracardiac: A prominent pericardial fat pad was present. Impressions:  - Normal LV wall thickness with LVEF 65-70%, grade 1 diastolic dysfunction. Mild tricuspid regurgitation with PASP 24 mmHg. Prominent epicardial fat pad, Transthoracic echocardiography. M-mode, complete 2D, spectral Doppler, and color Doppler. Height: Height: 154.9cm. Height: 61in. Weight: Weight: 88.5kg. Weight: 194.6lb. Body mass index: BMI: 36.8kg/m^2. Body surface area:  BSA: 1.104m^2. Blood pressure:   80/44. Patient status: Inpatient. Location: ICU/CCU  ASSESSMENT AND PLAN:  1. Complaints of heart racing: She has been taking extra doses of metoprolol and is now up to 25 mg 3 times a day. I'm going to change the metoprolol 25 mg twice a day to long-acting metoprolol 100 mg which she is to take at night. This will hopefully help with breakthrough rapid heart rhythm which she continues to experience. She had been referred to EP for ablation but has refused. Going to see if titrating beta blocker for long acting duration would be helpful to her.  I will get an echocardiogram for changes in LV systolic function. The patient has been followed by primary care in his recently had a lot of labs drawn. I will request those records.  2. Hypercholesterolemia: Remains on statin therapy.  3. Neuralgia: She continues to have numbness and tingling in her legs neck shoulders and arms. She is to follow with primary care  4. Abdominal pain and distention; this occurs mostly around eating. I suggested that she take simethicone prior to each meal. Follow-up with GI for ongoing management as already directed.   Current medicines are reviewed at length with the patient today.    Labs/  tests ordered today include:  Bettey Mare. Liborio Nixon, ANP, AACC   11/25/2016 7:06 AM    [facility] [address], [place], [place] [address] Phone: [phone]; Fax: [phone]

## 2016-11-29 ENCOUNTER — Ambulatory Visit (HOSPITAL_COMMUNITY)
Admission: RE | Admit: 2016-11-29 | Discharge: 2016-11-29 | Disposition: A | Discharge status: Home / Self Care | Payer: BLUE CROSS/BLUE SHIELD | Source: Ambulatory Visit | Attending: Adult Health | Admitting: Adult Health

## 2016-11-29 DIAGNOSIS — I10 Essential (primary) hypertension: Secondary | ICD-10-CM

## 2016-11-29 DIAGNOSIS — E7800 Pure hypercholesterolemia, unspecified: Secondary | ICD-10-CM | POA: Insufficient documentation

## 2016-11-29 DIAGNOSIS — J449 Chronic obstructive pulmonary disease, unspecified: Secondary | ICD-10-CM | POA: Diagnosis not present

## 2016-11-29 DIAGNOSIS — K219 Gastro-esophageal reflux disease without esophagitis: Secondary | ICD-10-CM | POA: Diagnosis not present

## 2016-11-29 LAB — ECHOCARDIOGRAM COMPLETE
FS: 52 % — AB (ref 28–44)
IVS/LV PW RATIO, ED: 1.04
LA ID, A-P, ES: 31 mm
LA diam end sys: 31 mm
LA diam index: 1.52 cm/m2
LA vol A4C: 44.6 ml
LA vol index: 21.7 mL/m2
LA vol: 44.2 mL
LV PW d: 9.85 mm — AB (ref 0.6–1.1)
LV dias vol index: 25 mL/m2
LV dias vol: 52 mL (ref 46–106)
LV e' LATERAL: 8.7 cm/s
LV sys vol index: 9 mL/m2
LV sys vol: 18 mL
LVOT SV: 73 mL
LVOT VTI: 23.4 cm
LVOT area: 3.14 cm2
LVOT diameter: 20 mm
LVOT peak grad rest: 6 mmHg
LVOT peak vel: 118 cm/s
Lateral S' vel: 15.8 cm/s
RV sys press: 29 mmHg
Reg peak vel: 227 cm/s
Simpson's disk: 66
Stroke v: 34 ml
TAPSE: 23.6 mm
TDI e' lateral: 8.7
TDI e' medial: 7.29
TR max vel: 227 cm/s

## 2016-11-29 NOTE — Progress Notes (Signed)
*  PRELIMINARY RESULTS* Echocardiogram 2D Echocardiogram has been performed.  Stacey Drain 11/29/2016, 3:06 PM

## 2016-12-09 ENCOUNTER — Telehealth: Payer: Self-pay | Admitting: Adult Health

## 2016-12-09 NOTE — Telephone Encounter (Signed)
Has a question abut how much Nitro she can take, states she was having chest pain this morning, but not now. Please give her a call @ [phone]

## 2016-12-09 NOTE — Telephone Encounter (Signed)
Patient has multiple complaints, her hands and face are cold,her vision is worsening, she strangling sensation in her chest, states her GERD is under control.Says she is doing a home sleep study tonight. She did not take any NTG but we talked about the rule of 3 NTG ,5 minutes apart . She said she felt like bees were stinging her chest earlier today, but this passed. I told her that she can always got to the ED if after 3 NTG she still has chst pain.   We confirmed her 1 month follow up K.Lyman BishopLawrence NP

## 2016-12-16 ENCOUNTER — Other Ambulatory Visit (HOSPITAL_BASED_OUTPATIENT_CLINIC_OR_DEPARTMENT_OTHER): Payer: Self-pay

## 2016-12-16 DIAGNOSIS — G4733 Obstructive sleep apnea (adult) (pediatric): Secondary | ICD-10-CM

## 2016-12-16 DIAGNOSIS — R5383 Other fatigue: Secondary | ICD-10-CM

## 2016-12-17 ENCOUNTER — Encounter (HOSPITAL_COMMUNITY): Payer: Self-pay | Admitting: Emergency Medicine

## 2016-12-17 ENCOUNTER — Emergency Department (HOSPITAL_COMMUNITY): Payer: BLUE CROSS/BLUE SHIELD

## 2016-12-17 ENCOUNTER — Emergency Department (HOSPITAL_COMMUNITY)
Admission: EM | Admit: 2016-12-17 | Discharge: 2016-12-17 | Disposition: A | Discharge status: Home / Self Care | Payer: BLUE CROSS/BLUE SHIELD | Attending: Emergency Medicine | Admitting: Emergency Medicine

## 2016-12-17 DIAGNOSIS — I110 Hypertensive heart disease with heart failure: Secondary | ICD-10-CM | POA: Diagnosis not present

## 2016-12-17 DIAGNOSIS — Z79891 Long term (current) use of opiate analgesic: Secondary | ICD-10-CM | POA: Insufficient documentation

## 2016-12-17 DIAGNOSIS — F17210 Nicotine dependence, cigarettes, uncomplicated: Secondary | ICD-10-CM | POA: Insufficient documentation

## 2016-12-17 DIAGNOSIS — Z7982 Long term (current) use of aspirin: Secondary | ICD-10-CM | POA: Diagnosis not present

## 2016-12-17 DIAGNOSIS — Y929 Unspecified place or not applicable: Secondary | ICD-10-CM | POA: Diagnosis not present

## 2016-12-17 DIAGNOSIS — E119 Type 2 diabetes mellitus without complications: Secondary | ICD-10-CM | POA: Diagnosis not present

## 2016-12-17 DIAGNOSIS — T1490XA Injury, unspecified, initial encounter: Secondary | ICD-10-CM

## 2016-12-17 DIAGNOSIS — Y939 Activity, unspecified: Secondary | ICD-10-CM | POA: Insufficient documentation

## 2016-12-17 DIAGNOSIS — S90111A Contusion of right great toe without damage to nail, initial encounter: Secondary | ICD-10-CM | POA: Diagnosis not present

## 2016-12-17 DIAGNOSIS — J449 Chronic obstructive pulmonary disease, unspecified: Secondary | ICD-10-CM | POA: Insufficient documentation

## 2016-12-17 DIAGNOSIS — I509 Heart failure, unspecified: Secondary | ICD-10-CM | POA: Diagnosis not present

## 2016-12-17 DIAGNOSIS — W230XXA Caught, crushed, jammed, or pinched between moving objects, initial encounter: Secondary | ICD-10-CM | POA: Insufficient documentation

## 2016-12-17 DIAGNOSIS — Z79899 Other long term (current) drug therapy: Secondary | ICD-10-CM | POA: Diagnosis not present

## 2016-12-17 DIAGNOSIS — Y999 Unspecified external cause status: Secondary | ICD-10-CM | POA: Insufficient documentation

## 2016-12-17 DIAGNOSIS — S99921A Unspecified injury of right foot, initial encounter: Secondary | ICD-10-CM | POA: Diagnosis present

## 2016-12-17 HISTORY — DX: Heart failure, unspecified: I50.9

## 2016-12-17 LAB — URINALYSIS, ROUTINE W REFLEX MICROSCOPIC
Bilirubin Urine: NEGATIVE
Glucose, UA: NEGATIVE mg/dL
Ketones, ur: NEGATIVE mg/dL
Leukocytes, UA: NEGATIVE
Nitrite: NEGATIVE
Protein, ur: NEGATIVE mg/dL
Specific Gravity, Urine: 1.005 (ref 1.005–1.030)
pH: 7 (ref 5.0–8.0)

## 2016-12-17 LAB — CBG MONITORING, ED
Glucose-Capillary: 151 mg/dL — ABNORMAL HIGH (ref 65–99)
Glucose-Capillary: 139 mg/dL — ABNORMAL HIGH (ref 65–99)

## 2016-12-17 MED ORDER — AMOXICILLIN-POT CLAVULANATE 875-125 MG PO TABS: 1.0000 | ORAL_TABLET | Freq: Two times a day (BID) | ORAL | 0 refills | Status: DC

## 2016-12-17 MED ORDER — AMOXICILLIN-POT CLAVULANATE 875-125 MG PO TABS
1.0000 | ORAL_TABLET | Freq: Once | ORAL | Status: AC
  Administered 2016-12-17: 1 via ORAL
  Filled 2016-12-17: qty 1

## 2016-12-17 NOTE — ED Provider Notes (Signed)
[facility]-EMERGENCY DEPT Provider Note   CSN: [id number] Arrival date & time: 12/17/16  1410     History   Chief Complaint Chief Complaint  Patient presents with  . Toe Injury    HPI Gina Bradley is a 56 y.o. female.Chief complaint is toe pain.  HPI 56 y/o female. History of insulin-dependent diabetes. Dropped a frozen water bottle on her right great toe 2 days ago. It is swollen and ecchymotic. She walks with a limp. Has a blister. States "I didn't like to get infected".  Past Medical History:  Diagnosis Date  . Anemia   . Cervical disc disease   . COPD (chronic obstructive pulmonary disease) (HCC)   . Diabetes mellitus without complication (HCC)   . Essential hypertension   . GERD (gastroesophageal reflux disease)   . Heart failure (HCC) 2018  . History of cardiac catheterization    Normal coronaries March 2015  . History of non-ST elevation myocardial infarction (NSTEMI)    Secondary to SVT  . Peripheral neuropathy   . Spinal stenosis   . SVT (supraventricular tachycardia) [facility])     Patient Active Problem List   Diagnosis Date Noted  . PMB (postmenopausal bleeding) 10/06/2016  . Chest pressure 01/05/2015  . Gastric erosion   . Encounter for screening colonoscopy 01/22/2014  . Constipation 01/20/2014  . GERD (gastroesophageal reflux disease) 01/20/2014  . Dyspepsia 01/20/2014  . SVT (supraventricular tachycardia) (HCC) 05/31/2013  . HTN (hypertension), benign 05/31/2013  . Hypercholesteremia 05/31/2013  . COPD (chronic obstructive pulmonary disease) (HCC) 05/31/2013  . Cervical stenosis of spine 05/31/2013  . Peripheral neuropathy 05/31/2013    Past Surgical History:  Procedure Laterality Date  . CESAREAN SECTION    . ESOPHAGOGASTRODUODENOSCOPY N/A 02/10/2014   QMV:HQIONG tiny antral erosion; otherwise normal EGD. No explanation for patient's symptoms. Gallbladder needs further evaluation.  [name] LEFT HEART CATHETERIZATION WITH CORONARY ANGIOGRAM N/A 06/03/2013   Procedure: LEFT HEART CATHETERIZATION WITH CORONARY ANGIOGRAM;  Surgeon: Lesleigh Noe, MD;  Location: [facility] CATH LAB;  Service: Cardiovascular;  Laterality: N/A;    OB History    Gravida Para Term Preterm AB Living   SAB TAB Ectopic Multiple Live Births           2       Home Medications    Prior to Admission medications   Medication Sig Start Date End Date Taking? Authorizing Provider  albuterol (PROVENTIL HFA;VENTOLIN HFA) 108 (90 Base) MCG/ACT inhaler Inhale into the lungs every 6 (six) hours as needed for wheezing or shortness of breath.    [provider]  albuterol (PROVENTIL) (2.5 MG/3ML) 0.083% nebulizer solution Take 2.5 mg by nebulization 4 (four) times daily.    [provider]  ALPRAZolam Prudy Feeler) 1 MG tablet Take 1 mg by mouth 3 (three) times daily as needed for anxiety or sleep. Anxiety 05/03/13   [provider]  amoxicillin-clavulanate (AUGMENTIN) 875-125 MG tablet Take 1 tablet by mouth 2 (two) times daily. 12/17/16   Rolland Porter, MD  ANORO ELLIPTA 62.5-25 MCG/INH AEPB Inhale 1 puff into the lungs daily.  09/09/16   [provider]  aspirin EC 81 MG EC tablet Take 1 tablet (81 mg total) by mouth daily. 06/03/13   Lonia Blood, MD  atorvastatin (LIPITOR) 20 MG tablet Take 20 mg by mouth every evening.  03/04/13   [provider]  co-enzyme Q-10 30 MG capsule Take 100 mg by mouth  daily.    [provider]  cyclobenzaprine (FLEXERIL) 10 MG tablet Take 10 mg by mouth at bedtime as needed.  08/30/16   [provider]  EPINEPHrine (EPIPEN 2-PAK) 0.3 mg/0.3 mL IJ SOAJ injection Inject 0.3 mLs (0.3 mg total) into the muscle once as needed (for severe allergic reaction). CAll 911 immediately if you have to use this medicine 10/21/15   Eber Hong, MD  furosemide (LASIX) 20 MG tablet Take 20 mg by mouth 2 (two) times daily.    [provider]  gabapentin (NEURONTIN) 100 MG capsule Take 100 mg by  mouth 3 (three) times daily. 05/25/13   [provider]  HYDROcodone-acetaminophen (NORCO) 10-325 MG per tablet Take 1 tablet by mouth 4 (four) times daily. pain 05/03/13   [provider]  metoprolol succinate (TOPROL-XL) 100 MG 24 hr tablet Take 1 tablet (100 mg total) by mouth daily. 11/25/16   Jodelle Gross, NP  nitroGLYCERIN (NITROSTAT) 0.4 MG SL tablet Place 1 tablet (0.4 mg total) under the tongue every 5 (five) minutes as needed for chest pain. 12/22/14   Rolland Porter, MD  Oxycodone HCl 10 MG TABS Take 5-10 mg by mouth daily as needed (for pain).  09/06/16   [provider]  pantoprazole (PROTONIX) 40 MG tablet Take 40 mg by mouth 2 (two) times daily. 05/25/13   [provider]  promethazine (PHENERGAN) 25 MG tablet Take 25 mg by mouth every 6 (six) hours as needed for nausea or vomiting.    [provider]  TOVIAZ 4 MG TB24 tablet Take 4 mg by mouth daily.  09/21/16   [provider]  triamcinolone cream (KENALOG) 0.1 % Apply 1 application topically as needed (ITCHING/IRRITATION).  04/28/15   [provider]  VIT B6-VIT B12-OMEGA 3 ACIDS PO Take 500 mg by mouth daily.    [provider]    Family History Family History  Problem Relation Age of Onset  . Heart attack Mother   . Diabetes Mother   . Hypertension Mother   . Angina Mother   . Diabetes Father   . Hypertension Father   . Heart failure Brother   . Suicidality Sister   . Colon cancer Unknown        Possibly Dad   . Stroke Maternal Grandmother   . Heart failure Maternal Grandmother   . Diabetes Maternal Grandfather   . Hypertension Daughter   . Hypertension Son     Social History Social History  Substance Use Topics  . Smoking status: Current Every Day Smoker    Packs/day: 0.50    Years: 18.00    Types: Cigarettes  . Smokeless tobacco: Never Used  . Alcohol use No     Allergies   Bee venom and Tdap [diphth-acell  pertussis-tetanus]   Review of Systems Review of Systems  Constitutional: Negative for appetite change, chills, diaphoresis, fatigue and fever.  HENT: Negative for mouth sores, sore throat and trouble swallowing.   Eyes: Negative for visual disturbance.  Respiratory: Negative for cough, chest tightness, shortness of breath and wheezing.   Cardiovascular: Negative for chest pain.  Gastrointestinal: Negative for abdominal distention, abdominal pain, diarrhea, nausea and vomiting.  Endocrine: Negative for polydipsia, polyphagia and polyuria.  Genitourinary: Negative for dysuria, frequency and hematuria.  Musculoskeletal: Negative for gait problem.       Toe pain  Skin: Negative for color change, pallor and rash.  Neurological: Negative for dizziness, syncope, light-headedness and headaches.  Hematological: Does not  bruise/bleed easily.  Psychiatric/Behavioral: Negative for behavioral problems and confusion.     Physical Exam Updated Vital Signs BP 118/88 (BP Location: Left Arm)   Pulse 72   Resp 18   Ht  (1.549 m)   Wt 90.7 kg (200 lb)   LMP 01/05/2015   SpO2 100%   BMI 37.79 kg/m   Physical Exam  Constitutional: She is oriented to person, place, and time. She appears well-developed and well-nourished. No distress.  HENT:  Head: Normocephalic.  Eyes: Pupils are equal, round, and reactive to light. Conjunctivae are normal. No scleral icterus.  Neck: Normal range of motion. Neck supple. No thyromegaly present.  Cardiovascular: Normal rate and regular rhythm.  Exam reveals no gallop and no friction rub.   No murmur heard. Pulmonary/Chest: Effort normal and breath sounds normal. No respiratory distress. She has no wheezes. She has no rales.  Abdominal: Soft. Bowel sounds are normal. She exhibits no distension. There is no tenderness. There is no rebound.  Musculoskeletal: Normal range of motion.  Eccymosis and STS of right great toe with superficial bullae.  Neurological:  She is alert and oriented to person, place, and time.  Skin: Skin is warm and dry. No rash noted.  Psychiatric: She has a normal mood and affect. Her behavior is normal.     ED Treatments / Results  Labs (all labs ordered are listed, but only abnormal results are displayed) Labs Reviewed  URINALYSIS, ROUTINE W REFLEX MICROSCOPIC - Abnormal; Notable for the following:       Result Value   Color, Urine STRAW (*)    Hgb urine dipstick MODERATE (*)    Bacteria, UA RARE (*)    Squamous Epithelial / LPF 0-5 (*)    All other components within normal limits  CBG MONITORING, ED - Abnormal; Notable for the following:    Glucose-Capillary 151 (*)    All other components within normal limits  CBG MONITORING, ED - Abnormal; Notable for the following:    Glucose-Capillary 139 (*)    All other components within normal limits    EKG  EKG Interpretation None       Radiology Dg Foot Complete Right  Result Date: 12/17/2016 CLINICAL DATA:  Dropped frozen drink bottle on right great toe. EXAM: RIGHT FOOT COMPLETE - 3+ VIEW COMPARISON:  None FINDINGS: There is no evidence of fracture or dislocation. There is no evidence of arthropathy or other focal bone abnormality. There is diffuse soft tissue swelling involving the forefoot. IMPRESSION: 1. No acute bone abnormality. 2. Soft tissue swelling. Electronically Signed   By: Signa Kell M.D.   On: 12/17/2016 16:32    Procedures Procedures (including critical care time)  Medications Ordered in ED Medications  amoxicillin-clavulanate (AUGMENTIN) 875-125 MG per tablet 1 tablet (1 tablet Oral Given 12/17/16 1824)     Initial Impression / Assessment and Plan / ED Course  I have reviewed the triage vital signs and the nursing notes.  Pertinent labs & imaging results that were available during my care of the patient were reviewed by me and considered in my medical decision making (see chart for details).     Negative x-ray. Will place on  Augmentin for wound prophylaxis considering her diabetes.  Final Clinical Impressions(s) / ED Diagnoses   Final diagnoses:  Contusion of right great toe without damage to nail, initial encounter  Type 2 diabetes mellitus without complication, without long-term current use of insulin (HCC)    New Prescriptions New Prescriptions  AMOXICILLIN-CLAVULANATE (AUGMENTIN) 875-125 MG TABLET    Take 1 tablet by mouth 2 (two) times daily.     Rolland Porter, MD 12/17/16 [id number]

## 2016-12-17 NOTE — ED Triage Notes (Signed)
Pt states dropping a frozen water bottle on right great toe yesterday. Toe bruised. Pt is a diabetic.

## 2016-12-17 NOTE — ED Notes (Signed)
Pt reports chest tightness after walking to rest room and back.  Pt used inhaler and says chest tightness went away.  Pt says doesn't want to stay in ed any longer for evaluation.  Informed pt that I would notify the EDP but she says she has to leave.

## 2016-12-17 NOTE — Discharge Instructions (Signed)
Keep toe clean and dry.  Augmentin as prescribed.  Recheck with any worsening

## 2016-12-17 NOTE — ED Notes (Signed)
Pt c/o feeling as if her sugar was low. Pt reports symptoms of dizziness and shakiness. CBG 139.

## 2016-12-23 ENCOUNTER — Ambulatory Visit: Payer: BLUE CROSS/BLUE SHIELD | Admitting: Internal Medicine

## 2016-12-26 ENCOUNTER — Ambulatory Visit (INDEPENDENT_AMBULATORY_CARE_PROVIDER_SITE_OTHER): Payer: BLUE CROSS/BLUE SHIELD | Admitting: Otolaryngology

## 2016-12-26 ENCOUNTER — Ambulatory Visit: Payer: BLUE CROSS/BLUE SHIELD | Admitting: Adult Health

## 2016-12-26 DIAGNOSIS — H903 Sensorineural hearing loss, bilateral: Secondary | ICD-10-CM | POA: Diagnosis not present

## 2016-12-26 DIAGNOSIS — H9313 Tinnitus, bilateral: Secondary | ICD-10-CM | POA: Diagnosis not present

## 2016-12-26 NOTE — Progress Notes (Deleted)
Cardiology Office Note   Date:  12/26/2016   ID:  Gina Bradley, DOB 04-16-1960, MRN [id number]  PCP:  Elfredia Nevins, MD  Cardiologist: Diona Browner Electrophysiologist: Sharrell Ku, M.D. No chief complaint on file.     History of Present Illness: Gina Bradley is a 56 y.o. female who presents for ongoing assessment and management of PSVT, with other history to include hypertension, anemia, GERD, and COPD along with peripheral neuropathy. She had a normal catheterization in 2015. She is also followed by Dr. Ladona Ridgel electrophysiology, due to PSVT. He increased her metoprolol dose. Patient also has a long history of anxiety.  On last office visit, metoprolol was changed to 100 mg XL, advised to take it at at bedtime to avoid daytime fatigue. Echocardiogram was ordered for evaluation of changes in LV function. She was to continue with primary care for ongoing somatic complaints.  Echocardiogram: 11/29/2016 Left ventricle: The cavity size was normal. Wall thickness was   normal. Systolic function was vigorous. The estimated ejection   fraction was in the range of 65% to 70%. Left ventricular   diastolic function parameters were normal.  Past Medical History:  Diagnosis Date  . Anemia   . Cervical disc disease   . COPD (chronic obstructive pulmonary disease) (HCC)   . Diabetes mellitus without complication (HCC)   . Essential hypertension   . GERD (gastroesophageal reflux disease)   . Heart failure (HCC) 2018  . History of cardiac catheterization    Normal coronaries March 2015  . History of non-ST elevation myocardial infarction (NSTEMI)    Secondary to SVT  . Peripheral neuropathy   . Spinal stenosis   . SVT (supraventricular tachycardia) (HCC)     Past Surgical History:  Procedure Laterality Date  . CESAREAN SECTION    . ESOPHAGOGASTRODUODENOSCOPY N/A 02/10/2014   WUJ:[id number] tiny antral erosion; otherwise normal EGD. No explanation for patient's symptoms. Gallbladder needs  further evaluation.  [name] LEFT HEART CATHETERIZATION WITH CORONARY ANGIOGRAM N/A 06/03/2013   Procedure: LEFT HEART CATHETERIZATION WITH CORONARY ANGIOGRAM;  Surgeon: Lesleigh Noe, MD;  Location: [facility] CATH LAB;  Service: Cardiovascular;  Laterality: N/A;     Current Outpatient Prescriptions  Medication Sig Dispense Refill  . albuterol (PROVENTIL HFA;VENTOLIN HFA) 108 (90 Base) MCG/ACT inhaler Inhale into the lungs every 6 (six) hours as needed for wheezing or shortness of breath.    [name] albuterol (PROVENTIL) (2.5 MG/3ML) 0.083% nebulizer solution Take 2.5 mg by nebulization 4 (four) times daily.    [name] ALPRAZolam (XANAX) 1 MG tablet Take 1 mg by mouth 3 (three) times daily as needed for anxiety or sleep. Anxiety    . amoxicillin-clavulanate (AUGMENTIN) 875-125 MG tablet Take 1 tablet by mouth 2 (two) times daily. 14 tablet 0  . ANORO ELLIPTA 62.5-25 MCG/INH AEPB Inhale 1 puff into the lungs daily.   11  . aspirin EC 81 MG EC tablet Take 1 tablet (81 mg total) by mouth daily.    [name] atorvastatin (LIPITOR) 20 MG tablet Take 20 mg by mouth every evening.     [name] co-enzyme Q-10 30 MG capsule Take 100 mg by mouth daily.    . cyclobenzaprine (FLEXERIL) 10 MG tablet Take 10 mg by mouth at bedtime as needed.   3  . EPINEPHrine (EPIPEN 2-PAK) 0.3 mg/0.3 mL IJ SOAJ injection Inject 0.3 mLs (0.3 mg total) into the muscle once as needed (for severe allergic reaction). CAll 911 immediately if you have to use this medicine 2 Device 1  .  furosemide (LASIX) 20 MG tablet Take 20 mg by mouth 2 (two) times daily.    [name] gabapentin (NEURONTIN) 100 MG capsule Take 100 mg by mouth 3 (three) times daily.    [name] HYDROcodone-acetaminophen (NORCO) 10-325 MG per tablet Take 1 tablet by mouth 4 (four) times daily. pain    . metoprolol succinate (TOPROL-XL) 100 MG 24 hr tablet Take 1 tablet (100 mg total) by mouth daily. 30 tablet 11  . nitroGLYCERIN (NITROSTAT) 0.4 MG SL tablet Place 1 tablet (0.4 mg total) under the tongue every 5  (five) minutes as needed for chest pain. 10 tablet 0  . Oxycodone HCl 10 MG TABS Take 5-10 mg by mouth daily as needed (for pain).   0  . pantoprazole (PROTONIX) 40 MG tablet Take 40 mg by mouth 2 (two) times daily.    . promethazine (PHENERGAN) 25 MG tablet Take 25 mg by mouth every 6 (six) hours as needed for nausea or vomiting.    . TOVIAZ 4 MG TB24 tablet Take 4 mg by mouth daily.   10  . triamcinolone cream (KENALOG) 0.1 % Apply 1 application topically as needed (ITCHING/IRRITATION).   2  . VIT B6-VIT B12-OMEGA 3 ACIDS PO Take 500 mg by mouth daily.     No current facility-administered medications for this visit.     Allergies:   Bee venom and Tdap [diphth-acell pertussis-tetanus]    Social History:  The patient  reports that she has been smoking Cigarettes.  She has a 9.00 pack-year smoking history. She has never used smokeless tobacco. She reports that she does not drink alcohol or use drugs.   Family History:  The patient's family history includes Angina in her mother; Colon cancer in her unknown relative; Diabetes in her father, maternal grandfather, and mother; Heart attack in her mother; Heart failure in her brother and maternal grandmother; Hypertension in her daughter, father, mother, and son; Stroke in her maternal grandmother; Suicidality in her sister.    ROS: All other systems are reviewed and negative. Unless otherwise mentioned in H&P    PHYSICAL EXAM: VS:  LMP 01/05/2015  , BMI There is no height or weight on file to calculate BMI. GEN: Well nourished, well developed, in no acute distress HEENT: normal Neck: no JVD, carotid bruits, or masses Cardiac: ***RRR; no murmurs, rubs, or gallops,no edema  Respiratory:  clear to auscultation bilaterally, normal work of breathing GI: soft, nontender, nondistended, + BS MS: no deformity or atrophy Skin: warm and dry, no rash Neuro:  Strength and sensation are intact Psych: euthymic mood, full affect   EKG:  EKG {ACTION;  IS/IS [id number]} ordered today. The ekg ordered today demonstrates ***   Recent Labs: 11/01/2016: BUN 15; Creatinine, Ser 0.71; Hemoglobin 14.1; Platelets 282; Potassium 4.0; Sodium 145    Lipid Panel    Component Value Date/Time   CHOL 204 (H) 06/01/2013 0438   TRIG 60 06/01/2013 0438   HDL 76 06/01/2013 0438   CHOLHDL 2.7 06/01/2013 0438   VLDL 12 06/01/2013 0438   LDLCALC 116 (H) 06/01/2013 0438      Wt Readings from Last 3 Encounters:  12/17/16 200 lb (90.7 kg)  11/25/16 202 lb 6.4 oz (91.8 kg)  11/01/16 196 lb (88.9 kg)      Other studies Reviewed: Additional studies/ records that were reviewed today include: ***. Review of the above records demonstrates: ***   ASSESSMENT AND PLAN:  1.  ***   Current medicines are reviewed at length with  the patient today.    Labs/ tests ordered today include: *** Bettey Mare. Liborio Nixon, ANP, AACC   12/26/2016 7:17 AM    [facility] [address], [place], [place] [address] Phone: [phone]; Fax: [phone]

## 2016-12-27 ENCOUNTER — Other Ambulatory Visit: Payer: Self-pay

## 2016-12-27 ENCOUNTER — Encounter: Payer: Self-pay | Admitting: Internal Medicine

## 2016-12-27 ENCOUNTER — Ambulatory Visit (INDEPENDENT_AMBULATORY_CARE_PROVIDER_SITE_OTHER): Payer: BLUE CROSS/BLUE SHIELD | Admitting: Internal Medicine

## 2016-12-27 VITALS — BP 107/74 | HR 77 | Temp 97.0°F | Ht 61.0 in | Wt 202.4 lb

## 2016-12-27 DIAGNOSIS — Z1211 Encounter for screening for malignant neoplasm of colon: Secondary | ICD-10-CM

## 2016-12-27 DIAGNOSIS — K219 Gastro-esophageal reflux disease without esophagitis: Secondary | ICD-10-CM

## 2016-12-27 DIAGNOSIS — Z800 Family history of malignant neoplasm of digestive organs: Secondary | ICD-10-CM

## 2016-12-27 DIAGNOSIS — R1319 Other dysphagia: Secondary | ICD-10-CM

## 2016-12-27 DIAGNOSIS — R1310 Dysphagia, unspecified: Secondary | ICD-10-CM | POA: Diagnosis not present

## 2016-12-27 DIAGNOSIS — R140 Abdominal distension (gaseous): Secondary | ICD-10-CM | POA: Diagnosis not present

## 2016-12-27 DIAGNOSIS — Z1159 Encounter for screening for other viral diseases: Secondary | ICD-10-CM

## 2016-12-27 DIAGNOSIS — K5909 Other constipation: Secondary | ICD-10-CM | POA: Diagnosis not present

## 2016-12-27 MED ORDER — PEG 3350-KCL-NA BICARB-NACL 420 G PO SOLR: 4000.0000 mL | ORAL | 0 refills | Status: DC

## 2016-12-27 NOTE — Patient Instructions (Addendum)
Schedule diagnostic EGD with possible esophageal dilation-refractory GERD and dysphagia and  high risk screening colonoscopy-first-ever examination.  Both cases at the same time with propofol  Hold metformin the day before the procedure.  We'll check a hepatic function profile and hepatitis C antibody at patient's request.

## 2016-12-27 NOTE — Progress Notes (Signed)
done

## 2016-12-27 NOTE — Progress Notes (Signed)
Primary Care Physician:  Elfredia Nevins, MD Primary Gastroenterologist:  Dr. Jena Gauss  Pre-Procedure History & Physical: HPI:  Gina Bradley is a 56 y.o. female here for here for further evaluation of abdominal bloating and constipation. Notes bowel movement only every couple of days when she takes mineral oil. Has not had any bleeding. No nausea or vomiting. Has reflux symptoms all the time; she describes heartburn lessened significantly with Protonix 40 mg twice daily. However, still has symptoms frequently on this regimen. Also notes intermittent esophageal dysphagia to solids. Last EGD 2015 demonstrated antral erosions. She was scheduled to have her first ever high risk screening colonoscopy that that time but became apprehensive about having it and canceled. She's never had a colonoscopy. Both her younger sister and father said to have colon cancer. She has multiple medical problems as outlined below. She is on multiple medications.  Past Medical History:  Diagnosis Date  . Anemia   . Cervical disc disease   . COPD (chronic obstructive pulmonary disease) (HCC)   . Diabetes mellitus without complication (HCC)   . Essential hypertension   . GERD (gastroesophageal reflux disease)   . Heart failure (HCC) 2018  . History of cardiac catheterization    Normal coronaries March 2015  . History of non-ST elevation myocardial infarction (NSTEMI)    Secondary to SVT  . Peripheral neuropathy   . Spinal stenosis   . SVT (supraventricular tachycardia) (HCC)     Past Surgical History:  Procedure Laterality Date  . CESAREAN SECTION    . ESOPHAGOGASTRODUODENOSCOPY N/A 02/10/2014   ONG:EXBMWU tiny antral erosion; otherwise normal EGD. No explanation for patient's symptoms. Gallbladder needs further evaluation.  [name] LEFT HEART CATHETERIZATION WITH CORONARY ANGIOGRAM N/A 06/03/2013   Procedure: LEFT HEART CATHETERIZATION WITH CORONARY ANGIOGRAM;  Surgeon: Lesleigh Noe, MD;  Location: [facility] CATH LAB;   Service: Cardiovascular;  Laterality: N/A;    Prior to Admission medications   Medication Sig Start Date End Date Taking? Authorizing Provider  albuterol (PROVENTIL HFA;VENTOLIN HFA) 108 (90 Base) MCG/ACT inhaler Inhale into the lungs every 6 (six) hours as needed for wheezing or shortness of breath.   Yes [provider]  albuterol (PROVENTIL) (2.5 MG/3ML) 0.083% nebulizer solution Take 2.5 mg by nebulization 4 (four) times daily.   Yes [provider]  ALPRAZolam Prudy Feeler) 1 MG tablet Take 1 mg by mouth 3 (three) times daily as needed for anxiety or sleep. Anxiety 05/03/13  Yes [provider]  ANORO ELLIPTA 62.5-25 MCG/INH AEPB Inhale 1 puff into the lungs daily.  09/09/16  Yes [provider]  aspirin EC 81 MG EC tablet Take 1 tablet (81 mg total) by mouth daily. 06/03/13  Yes Lonia Blood, MD  atorvastatin (LIPITOR) 20 MG tablet Take 20 mg by mouth every evening.  03/04/13  Yes [provider]  co-enzyme Q-10 30 MG capsule Take 100 mg by mouth daily.   Yes [provider]  cyclobenzaprine (FLEXERIL) 10 MG tablet Take 10 mg by mouth at bedtime as needed.  08/30/16  Yes [provider]  doxycycline (VIBRAMYCIN) 100 MG capsule Take 100 mg by mouth at bedtime. 12/15/16  Yes [provider]  EPINEPHrine (EPIPEN 2-PAK) 0.3 mg/0.3 mL IJ SOAJ injection Inject 0.3 mLs (0.3 mg total) into the muscle once as needed (for severe allergic reaction). CAll 911 immediately if you have to use this medicine 10/21/15  Yes Eber Hong, MD  furosemide (LASIX) 20 MG tablet Take 20 mg  by mouth 2 (two) times daily.   Yes [provider]  gabapentin (NEURONTIN) 100 MG capsule Take 100 mg by mouth 3 (three) times daily. 05/25/13  Yes [provider]  HYDROcodone-acetaminophen (NORCO) 10-325 MG per tablet Take 1 tablet by mouth every 6 (six) hours as needed. pain 05/03/13  Yes [provider]  Investigational vitamin D 600  UNITS capsule SWOG [id number] Take 600 Units by mouth daily. Take with food.   Yes [provider]  metFORMIN (GLUCOPHAGE) 500 MG tablet Take 500 mg by mouth as needed. 11/30/16  Yes [provider]  metoprolol succinate (TOPROL-XL) 100 MG 24 hr tablet Take 1 tablet (100 mg total) by mouth daily. 11/25/16  Yes Jodelle Gross, NP  nitroGLYCERIN (NITROSTAT) 0.4 MG SL tablet Place 1 tablet (0.4 mg total) under the tongue every 5 (five) minutes as needed for chest pain. 12/22/14  Yes Rolland Porter, MD  Oxycodone HCl 10 MG TABS Take 5-10 mg by mouth daily as needed (for pain).  09/06/16  Yes [provider]  pantoprazole (PROTONIX) 40 MG tablet Take 40 mg by mouth 2 (two) times daily. 05/25/13  Yes [provider]  promethazine (PHENERGAN) 25 MG tablet Take 25 mg by mouth every 6 (six) hours as needed for nausea or vomiting.   Yes [provider]  TOVIAZ 4 MG TB24 tablet Take 4 mg by mouth daily.  09/21/16  Yes [provider]  triamcinolone cream (KENALOG) 0.1 % Apply 1 application topically as needed (ITCHING/IRRITATION).  04/28/15  Yes [provider]  VIT B6-VIT B12-OMEGA 3 ACIDS PO Take 500 mg by mouth daily.   Yes [provider]  amoxicillin-clavulanate (AUGMENTIN) 875-125 MG tablet Take 1 tablet by mouth 2 (two) times daily. Patient not taking: Reported on 12/27/2016 12/17/16   Rolland Porter, MD    Allergies as of 12/27/2016 - Review Complete 12/27/2016  Allergen Reaction Noted  . Bee venom  12/04/2013  . Tdap [diphth-acell pertussis-tetanus]  12/04/2013    Family History  Problem Relation Age of Onset  . Heart attack Mother   . Diabetes Mother   . Hypertension Mother   . Angina Mother   . Diabetes Father   . Hypertension Father   . Heart failure Brother   . Suicidality Sister   . Colon cancer Unknown        Possibly Dad   . Stroke Maternal Grandmother   . Heart failure Maternal Grandmother   . Diabetes Maternal  Grandfather   . Hypertension Daughter   . Hypertension Son     Social History   Social History  . Marital status: Married    Spouse name: N/A  . Number of children: N/A  . Years of education: N/A   Occupational History  . [employer]   Social History Main Topics  . Smoking status: Current Every Day Smoker    Packs/day: 0.50    Years: 18.00    Types: Cigarettes  . Smokeless tobacco: Never Used  . Alcohol use No  . Drug use: No  . Sexual activity: Not Currently    Birth control/ protection: Post-menopausal   Other Topics Concern  . Not on file   Social History Narrative  . No narrative on file    Review of Systems: See HPI, otherwise negative ROS  Physical Exam: BP 107/74   Pulse 77   Temp (!) 97 F (36.1 C) (Oral)   Ht  (1.549 m)   Wt  202 lb 6.4 oz (91.8 kg)   LMP 01/05/2015   BMI 38.24 kg/m  General:   Alert,  , pleasant and cooperative in NAD Skin:  Intact without significant lesions or rashes. Neck:  Supple; no masses or thyromegaly. No significant cervical adenopathy. Lungs:  Clear throughout to auscultation.   No wheezes, crackles, or rhonchi. No acute distress. Heart:  Regular rate and rhythm; no murmurs, clicks, rubs,  or gallops. Abdomen: Non-distended, normal bowel sounds.  Soft and nontender without appreciable mass or hepatosplenomegaly.  Pulses:  Normal pulses noted. Extremities:  Without clubbing or edema.  Impression:  Pleasant 56 year old lady with multiple GI complaints including dysphagia, refractory GERD, abdominal bloating in the setting of constipation. Positive family history of colon cancer in 2 first-degree relatives. Recommendations: I have offered the patient an EGD with esophageal dilation and first ever high risk screening colonoscopy.  Given comorbidities and polypharmacy, will utilize propofol.   The risks, benefits, limitations, imponderables and alternatives regarding both EGD and colonoscopy have been reviewed  with the patient. Questions have been answered. All parties agreeable. .          Notice: This dictation was prepared with Dragon dictation along with smaller phrase technology. Any transcriptional errors that result from this process are unintentional and may not be corrected upon review.

## 2016-12-28 ENCOUNTER — Ambulatory Visit (INDEPENDENT_AMBULATORY_CARE_PROVIDER_SITE_OTHER): Payer: BLUE CROSS/BLUE SHIELD | Admitting: Urology

## 2016-12-28 DIAGNOSIS — M549 Dorsalgia, unspecified: Secondary | ICD-10-CM | POA: Diagnosis not present

## 2016-12-28 DIAGNOSIS — R3915 Urgency of urination: Secondary | ICD-10-CM | POA: Diagnosis not present

## 2016-12-29 ENCOUNTER — Encounter: Payer: Self-pay | Admitting: Adult Health

## 2016-12-29 ENCOUNTER — Ambulatory Visit (INDEPENDENT_AMBULATORY_CARE_PROVIDER_SITE_OTHER): Payer: BLUE CROSS/BLUE SHIELD | Admitting: Adult Health

## 2016-12-29 VITALS — BP 118/64 | HR 92 | Ht 61.0 in | Wt 202.0 lb

## 2016-12-29 DIAGNOSIS — Z720 Tobacco use: Secondary | ICD-10-CM | POA: Diagnosis not present

## 2016-12-29 DIAGNOSIS — I471 Supraventricular tachycardia, unspecified: Secondary | ICD-10-CM

## 2016-12-29 DIAGNOSIS — I10 Essential (primary) hypertension: Secondary | ICD-10-CM

## 2016-12-29 NOTE — Patient Instructions (Signed)
Medication Instructions:  Your physician recommends that you continue on your current medications as directed. Please refer to the Current Medication list given to you today.   Labwork: NONE  Testing/Procedures: NONE  Follow-Up: Your physician wants you to follow-up in: 6 Months with Dr. McDowell. You will receive a reminder letter in the mail two months in advance. If you don't receive a letter, please call our office to schedule the follow-up appointment.   Any Other Special Instructions Will Be Listed Below (If Applicable).     If you need a refill on your cardiac medications before your next appointment, please call your pharmacy. Thank you for choosing [facility]!    

## 2016-12-29 NOTE — Progress Notes (Signed)
In her clinic in a day weight 21 lesion left greater although both boys Cardiology Office Note   Date:  12/29/2016   ID:  Gina Bradley, DOB [birth year], MRN [id number]  PCP:  Elfredia Nevins, MD  Cardiologist:  Diona Browner EP: Gina Bunting, MD Chief Complaint  Patient presents with  . Palpitations  . Chest Pain  . Hypertension    History of Present Illness: Gina Bradley is a 56 y.o. female who presents for ongoing assessment and management for PSVT, recurrent chest pain, CHF, hypertension, history of anemia GERD and COPD. The patient had a normal cardiac catheterization in 2015. She was last seen in the office on 11/25/2016. At that time she was undergoing a lot of psychosocial issues to including loss of job. She had multiple somatic complaints. Her main complaint was heart racing taking extra doses of metoprolol, 25 mg 3 times a day. On that office visit she was changed to long-acting metoprolol 100 mg which she is to take at at bedtime. Echocardiogram was ordered.  Echocardiogram 11/27/2016  - Left ventricle: The cavity size was normal. Wall thickness was   normal. Systolic function was vigorous. The estimated ejection   fraction was in the range of 65% to 70%. Left ventricular   diastolic function parameters were normal.  She returns to the office today feeling much better with medication changes. She states her energy level has improved with the long-acting metoprolol versus divided doses. She's has no further complaints of chest pain. The patient's continuing to be treated with antibiotics for infected foot ulcer which is healing nicely. She unfortunately continues to smoke.   Past Medical History:  Diagnosis Date  . Anemia   . Cervical disc disease   . COPD (chronic obstructive pulmonary disease) (HCC)   . Diabetes mellitus without complication (HCC)   . Essential hypertension   . GERD (gastroesophageal reflux disease)   . Heart failure (HCC) 2018  . History of cardiac  catheterization    Normal coronaries March 2015  . History of non-ST elevation myocardial infarction (NSTEMI)    Secondary to SVT  . Peripheral neuropathy   . Spinal stenosis   . SVT (supraventricular tachycardia) (HCC)     Past Surgical History:  Procedure Laterality Date  . CESAREAN SECTION    . ESOPHAGOGASTRODUODENOSCOPY N/A 02/10/2014   GEX:BMWUXL tiny antral erosion; otherwise normal EGD. No explanation for patient's symptoms. Gallbladder needs further evaluation.  [name] LEFT HEART CATHETERIZATION WITH CORONARY ANGIOGRAM N/A 06/03/2013   Procedure: LEFT HEART CATHETERIZATION WITH CORONARY ANGIOGRAM;  Surgeon: Lesleigh Noe, MD;  Location: [facility] CATH LAB;  Service: Cardiovascular;  Laterality: N/A;     Current Outpatient Prescriptions  Medication Sig Dispense Refill  . albuterol (PROVENTIL HFA;VENTOLIN HFA) 108 (90 Base) MCG/ACT inhaler Inhale into the lungs every 6 (six) hours as needed for wheezing or shortness of breath.    [name] albuterol (PROVENTIL) (2.5 MG/3ML) 0.083% nebulizer solution Take 2.5 mg by nebulization 4 (four) times daily.    [name] ALPRAZolam (XANAX) 1 MG tablet Take 1 mg by mouth 3 (three) times daily as needed for anxiety or sleep. Anxiety    . amoxicillin-clavulanate (AUGMENTIN) 875-125 MG tablet Take 1 tablet by mouth 2 (two) times daily. 14 tablet 0  . ANORO ELLIPTA 62.5-25 MCG/INH AEPB Inhale 1 puff into the lungs daily.   11  . aspirin EC 81 MG EC tablet Take 1 tablet (81 mg total) by mouth daily.    [name] atorvastatin (LIPITOR) 20 MG  tablet Take 20 mg by mouth every evening.     [name] co-enzyme Q-10 30 MG capsule Take 100 mg by mouth daily.    . cyclobenzaprine (FLEXERIL) 10 MG tablet Take 10 mg by mouth at bedtime as needed.   3  . doxycycline (VIBRAMYCIN) 100 MG capsule Take 100 mg by mouth at bedtime.  1  . EPINEPHrine (EPIPEN 2-PAK) 0.3 mg/0.3 mL IJ SOAJ injection Inject 0.3 mLs (0.3 mg total) into the muscle once as needed (for severe allergic reaction). CAll 911  immediately if you have to use this medicine 2 Device 1  . furosemide (LASIX) 20 MG tablet Take 20 mg by mouth 2 (two) times daily.    [name] gabapentin (NEURONTIN) 100 MG capsule Take 100 mg by mouth 3 (three) times daily.    [name] HYDROcodone-acetaminophen (NORCO) 10-325 MG per tablet Take 1 tablet by mouth every 6 (six) hours as needed. pain    . Investigational vitamin D 600 UNITS capsule SWOG S0812 Take 600 Units by mouth daily. Take with food.    . metFORMIN (GLUCOPHAGE) 500 MG tablet Take 500 mg by mouth as needed.  10  . metoprolol succinate (TOPROL-XL) 100 MG 24 hr tablet Take 1 tablet (100 mg total) by mouth daily. 30 tablet 11  . nitroGLYCERIN (NITROSTAT) 0.4 MG SL tablet Place 1 tablet (0.4 mg total) under the tongue every 5 (five) minutes as needed for chest pain. 10 tablet 0  . Oxycodone HCl 10 MG TABS Take 5-10 mg by mouth daily as needed (for pain).   0  . pantoprazole (PROTONIX) 40 MG tablet Take 40 mg by mouth 2 (two) times daily.    . polyethylene glycol-electrolytes (TRILYTE) 420 g solution Take 4,000 mLs by mouth as directed. 4000 mL 0  . promethazine (PHENERGAN) 25 MG tablet Take 25 mg by mouth every 6 (six) hours as needed for nausea or vomiting.    . TOVIAZ 4 MG TB24 tablet Take 4 mg by mouth daily.   10  . triamcinolone cream (KENALOG) 0.1 % Apply 1 application topically as needed (ITCHING/IRRITATION).   2  . VIT B6-VIT B12-OMEGA 3 ACIDS PO Take 500 mg by mouth daily.     No current facility-administered medications for this visit.     Allergies:   Bee venom and Tdap [diphth-acell pertussis-tetanus]    Social History:  The patient  reports that she has been smoking Cigarettes.  She has a 9.00 pack-year smoking history. She has never used smokeless tobacco. She reports that she does not drink alcohol or use drugs.   Family History:  The patient's family history includes Angina in her mother; Colon cancer in her unknown relative; Diabetes in her father, maternal grandfather, and  mother; Heart attack in her mother; Heart failure in her brother and maternal grandmother; Hypertension in her daughter, father, mother, and son; Stroke in her maternal grandmother; Suicidality in her sister.    ROS: All other systems are reviewed and negative. Unless otherwise mentioned in H&P    PHYSICAL EXAM: VS:  BP 118/64   Pulse 92   Ht  (1.549 m)   Wt 202 lb (91.6 kg)   LMP 01/05/2015   SpO2 95%   BMI 38.17 kg/m  , BMI Body mass index is 38.17 kg/m. GEN: Well nourished, well developed, in no acute distress  HEENT: normal  Neck: no JVD, carotid bruits, or masses Cardiac: RRR; no murmurs, rubs, or gallops,no edema  Respiratory:  clear to auscultation bilaterally, normal  work of breathing, no evidence of wheezing. GI: soft, nontender, nondistended, + BS MS: no deformity or atrophy Healing right great toe foot ulcer. Skin: warm and dry, no rash Neuro:  Strength and sensation are intact Psych: euthymic mood, full affect  Recent Labs: 11/01/2016: BUN 15; Creatinine, Ser 0.71; Hemoglobin 14.1; Platelets 282; Potassium 4.0; Sodium 145    Lipid Panel    Component Value Date/Time   CHOL 204 (H) 06/01/2013 0438   TRIG 60 06/01/2013 0438   HDL 76 06/01/2013 0438   CHOLHDL 2.7 06/01/2013 0438   VLDL 12 06/01/2013 0438   LDLCALC 116 (H) 06/01/2013 0438      Wt Readings from Last 3 Encounters:  12/29/16 202 lb (91.6 kg)  12/27/16 202 lb 6.4 oz (91.8 kg)  12/17/16 200 lb (90.7 kg)      Other studies Reviewed:  Echocardiogram 11/29/2016  - Left ventricle: The cavity size was normal. Wall thickness was   normal. Systolic function was vigorous. The estimated ejection   fraction was in the range of 65% to 70%. Left ventricular   diastolic function parameters were normal.   ASSESSMENT AND PLAN:  1. PSVT: The patient has had no further complaints of rapid heart rhythm, associated dyspnea, or dizziness. She is tolerating the long-acting metoprolol much better than  the divided doses. The patient states that her energy level is beginning to improve some. I will continue her on current medication regimen. Will not make any further adjustments at this time. Can consider changing it to nighttime dose if she Has recurrent fatigue but she appears to be doing well with daytime dosing.  2. Hypertension: Blood pressure is currently well-controlled.  3. Ongoing tobacco abuse: I counseled her on smoking cessation, especially in light of COPD, chronic infections, and dyspnea. She states she's going to try and quit again.  4. Hypercholesterolemia: Patient will continue statin therapy as directed.   Current medicines are reviewed at length with the patient today.    Labs/ tests ordered today include: None   Bettey Mare. Liborio Nixon, ANP, AACC   12/29/2016 4:21 PM    [facility] [address], [place], [place] [address] Phone: [phone]; Fax: [phone]

## 2017-01-02 DIAGNOSIS — G4733 Obstructive sleep apnea (adult) (pediatric): Secondary | ICD-10-CM | POA: Diagnosis not present

## 2017-01-23 NOTE — Patient Instructions (Signed)
Gina Bradley  01/23/2017     @PREFPERIOPPHARMACY @   Your procedure is scheduled on  01/30/2017 .  Report to [facility] at  1200  P.M.  Call this number if you have problems the morning of surgery:  [phone]   Remember:  Do not eat food or drink liquids after midnight.  Take these medicines the morning of surgery with A SIP OF WATER  Xanax, neurontin, hydrocodone or oxycodone, metoprolol, protonix, toviaz. Use your inhaler and your nebulizer before you come. DO NOT take any medications for diabetes the morning of your procedure.   Do not wear jewelry, make-up or nail polish.  Do not wear lotions, powders, or perfumes, or deoderant.  Do not shave 48 hours prior to surgery.  Men may shave face and neck.  Do not bring valuables to the hospital.  [facility] is not responsible for any belongings or valuables.  Contacts, dentures or bridgework may not be worn into surgery.  Leave your suitcase in the car.  After surgery it may be brought to your room.  For patients admitted to the hospital, discharge time will be determined by your treatment team.  Patients discharged the day of surgery will not be allowed to drive home.   Name and phone number of your driver:   family Special instructions:  Follow the diet and prep instructions given to you by Dr Luvenia Starch office.  Please read over the following fact sheets that you were given. Anesthesia Post-op Instructions and Care and Recovery After Surgery       Esophagogastroduodenoscopy Esophagogastroduodenoscopy (EGD) is a procedure to examine the lining of the esophagus, stomach, and first part of the small intestine (duodenum). This procedure is done to check for problems such as inflammation, bleeding, ulcers, or growths. During this procedure, a long, flexible, lighted tube with a camera attached (endoscope) is inserted down the throat. Tell a health care provider about:  Any allergies you  have.  All medicines you are taking, including vitamins, herbs, eye drops, creams, and over-the-counter medicines.  Any problems you or family members have had with anesthetic medicines.  Any blood disorders you have.  Any surgeries you have had.  Any medical conditions you have.  Whether you are pregnant or may be pregnant. What are the risks? Generally, this is a safe procedure. However, problems may occur, including:  Infection.  Bleeding.  A tear (perforation) in the esophagus, stomach, or duodenum.  Trouble breathing.  Excessive sweating.  Spasms of the larynx.  A slowed heartbeat.  Low blood pressure.  What happens before the procedure?  Follow instructions from your health care provider about eating or drinking restrictions.  Ask your health care provider about: ? Changing or stopping your regular medicines. This is especially important if you are taking diabetes medicines or blood thinners. ? Taking medicines such as aspirin and ibuprofen. These medicines can thin your blood. Do not take these medicines before your procedure if your health care provider instructs you not to.  Plan to have someone take you home after the procedure.  If you wear dentures, be ready to remove them before the procedure. What happens during the procedure?  To reduce your risk of infection, your health care team will wash or sanitize their hands.  An IV tube will be put in a vein in your hand or arm. You will get medicines and fluids through this tube.  You will be given one or more of the following: ? A medicine to help you relax (sedative). ? A medicine to numb the area (local anesthetic). This medicine may be sprayed into your throat. It will make you feel more comfortable and keep you from gagging or coughing during the procedure. ? A medicine for pain.  A mouth guard may be placed in your mouth to protect your teeth and to keep you from biting on the endoscope.  You will  be asked to lie on your left side.  The endoscope will be lowered down your throat into your esophagus, stomach, and duodenum.  Air will be put into the endoscope. This will help your health care provider see better.  The lining of your esophagus, stomach, and duodenum will be examined.  Your health care provider may: ? Take a tissue sample so it can be looked at in a lab (biopsy). ? Remove growths. ? Remove objects (foreign bodies) that are stuck. ? Treat any bleeding with medicines or other devices that stop tissue from bleeding. ? Widen (dilate) or stretch narrowed areas of your esophagus and stomach.  The endoscope will be taken out. The procedure may vary among health care providers and hospitals. What happens after the procedure?  Your blood pressure, heart rate, breathing rate, and blood oxygen level will be monitored often until the medicines you were given have worn off.  Do not eat or drink anything until the numbing medicine has worn off and your gag reflex has returned. This information is not intended to replace advice given to you by your health care provider. Make sure you discuss any questions you have with your health care provider. Document Released: 07/22/2004 Document Revised: 08/27/2015 Document Reviewed: 02/12/2015 Elsevier Interactive Patient Education  2018 [employer] Esophagogastroduodenoscopy, Care After Refer to this sheet in the next few weeks. These instructions provide you with information about caring for yourself after your procedure. Your health care provider may also give you more specific instructions. Your treatment has been planned according to current medical practices, but problems sometimes occur. Call your health care provider if you have any problems or questions after your procedure. What can I expect after the procedure? After the procedure, it is common to have:  A sore throat.  Nausea.  Bloating.  Dizziness.  Fatigue.  Follow  these instructions at home:  Do not eat or drink anything until the numbing medicine (local anesthetic) has worn off and your gag reflex has returned. You will know that the local anesthetic has worn off when you can swallow comfortably.  Do not drive for 24 hours if you received a medicine to help you relax (sedative).  If your health care provider took a tissue sample for testing during the procedure, make sure to get your test results. This is your responsibility. Ask your health care provider or the department performing the test when your results will be ready.  Keep all follow-up visits as told by your health care provider. This is important. Contact a health care provider if:  You cannot stop coughing.  You are not urinating.  You are urinating less than usual. Get help right away if:  You have trouble swallowing.  You cannot eat or drink.  You have throat or chest pain that gets worse.  You are dizzy or light-headed.  You faint.  You have nausea or vomiting.  You have chills.  You have a fever.  You have severe abdominal pain.  You have  black, tarry, or bloody stools. This information is not intended to replace advice given to you by your health care provider. Make sure you discuss any questions you have with your health care provider. Document Released: 03/07/2012 Document Revised: 08/27/2015 Document Reviewed: 02/12/2015 Elsevier Interactive Patient Education  2018 [employer].  Esophageal Dilatation Esophageal dilatation is a procedure to open a blocked or narrowed part of the esophagus. The esophagus is the long tube in your throat that carries food and liquid from your mouth to your stomach. The procedure is also called esophageal dilation. You may need this procedure if you have a buildup of scar tissue in your esophagus that makes it difficult, painful, or even impossible to swallow. This can be caused by gastroesophageal reflux disease (GERD). In rare  cases, people need this procedure because they have cancer of the esophagus or a problem with the way food moves through the esophagus. Sometimes you may need to have another dilatation to enlarge the opening of the esophagus gradually. Tell a health care provider about:  Any allergies you have.  All medicines you are taking, including vitamins, herbs, eye drops, creams, and over-the-counter medicines.  Any problems you or family members have had with anesthetic medicines.  Any blood disorders you have.  Any surgeries you have had.  Any medical conditions you have.  Any antibiotic medicines you are required to take before dental procedures. What are the risks? Generally, this is a safe procedure. However, problems can occur and include:  Bleeding from a tear in the lining of the esophagus.  A hole (perforation) in the esophagus.  What happens before the procedure?  Do not eat or drink anything after midnight on the night before the procedure or as directed by your health care provider.  Ask your health care provider about changing or stopping your regular medicines. This is especially important if you are taking diabetes medicines or blood thinners.  Plan to have someone take you home after the procedure. What happens during the procedure?  You will be given a medicine that makes you relaxed and sleepy (sedative).  A medicine may be sprayed or gargled to numb the back of the throat.  Your health care provider can use various instruments to do an esophageal dilatation. During the procedure, the instrument used will be placed in your mouth and passed down into your esophagus. Options include: ? Simple dilators. This instrument is carefully placed in the esophagus to stretch it. ? Guided wire bougies. In this method, a flexible tube (endoscope) is used to insert a wire into the esophagus. The dilator is passed over this wire to enlarge the esophagus. Then the wire is  removed. ? Balloon dilators. An endoscope with a small balloon at the end is passed down into the esophagus. Inflating the balloon gently stretches the esophagus and opens it up. What happens after the procedure?  Your blood pressure, heart rate, breathing rate, and blood oxygen level will be monitored often until the medicines you were given have worn off.  Your throat may feel slightly sore and will probably still feel numb. This will improve slowly over time.  You will not be allowed to eat or drink until the throat numbness has resolved.  If this is a same-day procedure, you may be allowed to go home once you have been able to drink, urinate, and sit on the edge of the bed without nausea or dizziness.  If this is a same-day procedure, you should have a friend  or family member with you for the next 24 hours after the procedure. This information is not intended to replace advice given to you by your health care provider. Make sure you discuss any questions you have with your health care provider. Document Released: 05/12/2005 Document Revised: 08/27/2015 Document Reviewed: 07/31/2013 Elsevier Interactive Patient Education  2017 [employer]  Colonoscopy, Adult A colonoscopy is an exam to look at the entire large intestine. During the exam, a lubricated, bendable tube is inserted into the anus and then passed into the rectum, colon, and other parts of the large intestine. A colonoscopy is often done as a part of normal colorectal screening or in response to certain symptoms, such as anemia, persistent diarrhea, abdominal pain, and blood in the stool. The exam can help screen for and diagnose medical problems, including:  Tumors.  Polyps.  Inflammation.  Areas of bleeding.  Tell a health care provider about:  Any allergies you have.  All medicines you are taking, including vitamins, herbs, eye drops, creams, and over-the-counter medicines.  Any problems you or family members have  had with anesthetic medicines.  Any blood disorders you have.  Any surgeries you have had.  Any medical conditions you have.  Any problems you have had passing stool. What are the risks? Generally, this is a safe procedure. However, problems may occur, including:  Bleeding.  A tear in the intestine.  A reaction to medicines given during the exam.  Infection (rare).  What happens before the procedure? Eating and drinking restrictions Follow instructions from your health care provider about eating and drinking, which may include:  A few days before the procedure - follow a low-fiber diet. Avoid nuts, seeds, dried fruit, raw fruits, and vegetables.  1-3 days before the procedure - follow a clear liquid diet. Drink only clear liquids, such as clear broth or bouillon, black coffee or tea, clear juice, clear soft drinks or sports drinks, gelatin dessert, and popsicles. Avoid any liquids that contain red or purple dye.  On the day of the procedure - do not eat or drink anything during the 2 hours before the procedure, or within the time period that your health care provider recommends.  Bowel prep If you were prescribed an oral bowel prep to clean out your colon:  Take it as told by your health care provider. Starting the day before your procedure, you will need to drink a large amount of medicated liquid. The liquid will cause you to have multiple loose stools until your stool is almost clear or light green.  If your skin or anus gets irritated from diarrhea, you may use these to relieve the irritation: ? Medicated wipes, such as adult wet wipes with aloe and vitamin E. ? A skin soothing-product like petroleum jelly.  If you vomit while drinking the bowel prep, take a break for up to 60 minutes and then begin the bowel prep again. If vomiting continues and you cannot take the bowel prep without vomiting, call your health care provider.  General instructions  Ask your health care  provider about changing or stopping your regular medicines. This is especially important if you are taking diabetes medicines or blood thinners.  Plan to have someone take you home from the hospital or clinic. What happens during the procedure?  An IV tube may be inserted into one of your veins.  You will be given medicine to help you relax (sedative).  To reduce your risk of infection: ? Your health care team  will wash or sanitize their hands. ? Your anal area will be washed with soap.  You will be asked to lie on your side with your knees bent.  Your health care provider will lubricate a long, thin, flexible tube. The tube will have a camera and a light on the end.  The tube will be inserted into your anus.  The tube will be gently eased through your rectum and colon.  Air will be delivered into your colon to keep it open. You may feel some pressure or cramping.  The camera will be used to take images during the procedure.  A small tissue sample may be removed from your body to be examined under a microscope (biopsy). If any potential problems are found, the tissue will be sent to a lab for testing.  If small polyps are found, your health care provider may remove them and have them checked for cancer cells.  The tube that was inserted into your anus will be slowly removed. The procedure may vary among health care providers and hospitals. What happens after the procedure?  Your blood pressure, heart rate, breathing rate, and blood oxygen level will be monitored until the medicines you were given have worn off.  Do not drive for 24 hours after the exam.  You may have a small amount of blood in your stool.  You may pass gas and have mild abdominal cramping or bloating due to the air that was used to inflate your colon during the exam.  It is up to you to get the results of your procedure. Ask your health care provider, or the department performing the procedure, when your  results will be ready. This information is not intended to replace advice given to you by your health care provider. Make sure you discuss any questions you have with your health care provider. Document Released: 03/18/2000 Document Revised: 01/20/2016 Document Reviewed: 06/02/2015 Elsevier Interactive Patient Education  2018 [employer].  Colonoscopy, Adult, Care After This sheet gives you information about how to care for yourself after your procedure. Your health care provider may also give you more specific instructions. If you have problems or questions, contact your health care provider. What can I expect after the procedure? After the procedure, it is common to have:  A small amount of blood in your stool for 24 hours after the procedure.  Some gas.  Mild abdominal cramping or bloating.  Follow these instructions at home: General instructions   For the first 24 hours after the procedure: ? Do not drive or use machinery. ? Do not sign important documents. ? Do not drink alcohol. ? Do your regular daily activities at a slower pace than normal. ? Eat soft, easy-to-digest foods. ? Rest often.  Take over-the-counter or prescription medicines only as told by your health care provider.  It is up to you to get the results of your procedure. Ask your health care provider, or the department performing the procedure, when your results will be ready. Relieving cramping and bloating  Try walking around when you have cramps or feel bloated.  Apply heat to your abdomen as told by your health care provider. Use a heat source that your health care provider recommends, such as a moist heat pack or a heating pad. ? Place a towel between your skin and the heat source. ? Leave the heat on for 20-30 minutes. ? Remove the heat if your skin turns bright red. This is especially important if you are  unable to feel pain, heat, or cold. You may have a greater risk of getting burned. Eating and  drinking  Drink enough fluid to keep your urine clear or pale yellow.  Resume your normal diet as instructed by your health care provider. Avoid heavy or fried foods that are hard to digest.  Avoid drinking alcohol for as long as instructed by your health care provider. Contact a health care provider if:  You have blood in your stool 2-3 days after the procedure. Get help right away if:  You have more than a small spotting of blood in your stool.  You pass large blood clots in your stool.  Your abdomen is swollen.  You have nausea or vomiting.  You have a fever.  You have increasing abdominal pain that is not relieved with medicine. This information is not intended to replace advice given to you by your health care provider. Make sure you discuss any questions you have with your health care provider. Document Released: 11/03/2003 Document Revised: 12/14/2015 Document Reviewed: 06/02/2015 Elsevier Interactive Patient Education  2018 [employer]  Monitored Anesthesia Care Anesthesia is a term that refers to techniques, procedures, and medicines that help a person stay safe and comfortable during a medical procedure. Monitored anesthesia care, or sedation, is one type of anesthesia. Your anesthesia specialist may recommend sedation if you will be having a procedure that does not require you to be unconscious, such as:  Cataract surgery.  A dental procedure.  A biopsy.  A colonoscopy.  During the procedure, you may receive a medicine to help you relax (sedative). There are three levels of sedation:  Mild sedation. At this level, you may feel awake and relaxed. You will be able to follow directions.  Moderate sedation. At this level, you will be sleepy. You may not remember the procedure.  Deep sedation. At this level, you will be asleep. You will not remember the procedure.  The more medicine you are given, the deeper your level of sedation will be. Depending on how you  respond to the procedure, the anesthesia specialist may change your level of sedation or the type of anesthesia to fit your needs. An anesthesia specialist will monitor you closely during the procedure. Let your health care provider know about:  Any allergies you have.  All medicines you are taking, including vitamins, herbs, eye drops, creams, and over-the-counter medicines.  Any use of steroids (by mouth or as a cream).  Any problems you or family members have had with sedatives and anesthetic medicines.  Any blood disorders you have.  Any surgeries you have had.  Any medical conditions you have, such as sleep apnea.  Whether you are pregnant or may be pregnant.  Any use of cigarettes, alcohol, or street drugs. What are the risks? Generally, this is a safe procedure. However, problems may occur, including:  Getting too much medicine (oversedation).  Nausea.  Allergic reaction to medicines.  Trouble breathing. If this happens, a breathing tube may be used to help with breathing. It will be removed when you are awake and breathing on your own.  Heart trouble.  Lung trouble.  Before the procedure Staying hydrated Follow instructions from your health care provider about hydration, which may include:  Up to 2 hours before the procedure - you may continue to drink clear liquids, such as water, clear fruit juice, black coffee, and plain tea.  Eating and drinking restrictions Follow instructions from your health care provider about eating and drinking, which  may include:  8 hours before the procedure - stop eating heavy meals or foods such as meat, fried foods, or fatty foods.  6 hours before the procedure - stop eating light meals or foods, such as toast or cereal.  6 hours before the procedure - stop drinking milk or drinks that contain milk.  2 hours before the procedure - stop drinking clear liquids.  Medicines Ask your health care provider about:  Changing or  stopping your regular medicines. This is especially important if you are taking diabetes medicines or blood thinners.  Taking medicines such as aspirin and ibuprofen. These medicines can thin your blood. Do not take these medicines before your procedure if your health care provider instructs you not to.  Tests and exams  You will have a physical exam.  You may have blood tests done to show: ? How well your kidneys and liver are working. ? How well your blood can clot.  General instructions  Plan to have someone take you home from the hospital or clinic.  If you will be going home right after the procedure, plan to have someone with you for 24 hours.  What happens during the procedure?  Your blood pressure, heart rate, breathing, level of pain and overall condition will be monitored.  An IV tube will be inserted into one of your veins.  Your anesthesia specialist will give you medicines as needed to keep you comfortable during the procedure. This may mean changing the level of sedation.  The procedure will be performed. After the procedure  Your blood pressure, heart rate, breathing rate, and blood oxygen level will be monitored until the medicines you were given have worn off.  Do not drive for 24 hours if you received a sedative.  You may: ? Feel sleepy, clumsy, or nauseous. ? Feel forgetful about what happened after the procedure. ? Have a sore throat if you had a breathing tube during the procedure. ? Vomit. This information is not intended to replace advice given to you by your health care provider. Make sure you discuss any questions you have with your health care provider. Document Released: 12/15/2004 Document Revised: 08/28/2015 Document Reviewed: 07/12/2015 Elsevier Interactive Patient Education  2018 [employer] Monitored Anesthesia Care, Care After These instructions provide you with information about caring for yourself after your procedure. Your health care  provider may also give you more specific instructions. Your treatment has been planned according to current medical practices, but problems sometimes occur. Call your health care provider if you have any problems or questions after your procedure. What can I expect after the procedure? After your procedure, it is common to:  Feel sleepy for several hours.  Feel clumsy and have poor balance for several hours.  Feel forgetful about what happened after the procedure.  Have poor judgment for several hours.  Feel nauseous or vomit.  Have a sore throat if you had a breathing tube during the procedure.  Follow these instructions at home: For at least 24 hours after the procedure:   Do not: ? Participate in activities in which you could fall or become injured. ? Drive. ? Use heavy machinery. ? Drink alcohol. ? Take sleeping pills or medicines that cause drowsiness. ? Make important decisions or sign legal documents. ? Take care of children on your own.  Rest. Eating and drinking  Follow the diet that is recommended by your health care provider.  If you vomit, drink water, juice, or soup when you can  drink without vomiting.  Make sure you have little or no nausea before eating solid foods. General instructions  Have a responsible adult stay with you until you are awake and alert.  Take over-the-counter and prescription medicines only as told by your health care provider.  If you smoke, do not smoke without supervision.  Keep all follow-up visits as told by your health care provider. This is important. Contact a health care provider if:  You keep feeling nauseous or you keep vomiting.  You feel light-headed.  You develop a rash.  You have a fever. Get help right away if:  You have trouble breathing. This information is not intended to replace advice given to you by your health care provider. Make sure you discuss any questions you have with your health care  provider. Document Released: 07/12/2015 Document Revised: 11/11/2015 Document Reviewed: 07/12/2015 Elsevier Interactive Patient Education  [employer].

## 2017-01-25 ENCOUNTER — Telehealth: Payer: Self-pay

## 2017-01-25 NOTE — Telephone Encounter (Signed)
Pt called office this afternoon to cancel her colonoscopy that was for 01/30/17. She stated she had a tragedy in her family (son-in-law died and her brother had a stroke). She also has no one to take her for the colonoscopy. Called and informed Endo scheduler, procedure canceled. She is aware that she will need OV to reschedule colonoscopy (polypharmacy).  Routing to Dr. Jena Gaussourk as Lorain ChildesFYI.

## 2017-01-26 ENCOUNTER — Encounter (HOSPITAL_COMMUNITY): Payer: Self-pay

## 2017-01-26 ENCOUNTER — Encounter (HOSPITAL_COMMUNITY)
Admission: RE | Admit: 2017-01-26 | Discharge: 2017-01-26 | Disposition: A | Discharge status: Home / Self Care | Payer: BLUE CROSS/BLUE SHIELD | Source: Ambulatory Visit | Attending: Internal Medicine | Admitting: Internal Medicine

## 2017-01-26 NOTE — Telephone Encounter (Signed)
Communication noted. Reschedule her office visit for January 2019

## 2017-01-26 NOTE — Telephone Encounter (Signed)
Patient on recall for jan office visit

## 2017-01-26 NOTE — Telephone Encounter (Signed)
Routing to Stacey to schedule OV. 

## 2017-01-27 ENCOUNTER — Emergency Department (HOSPITAL_COMMUNITY)
Admission: EM | Admit: 2017-01-27 | Discharge: 2017-01-27 | Disposition: A | Discharge status: Home / Self Care | Payer: Medicare Other | Attending: Emergency Medicine | Admitting: Emergency Medicine

## 2017-01-27 ENCOUNTER — Encounter (HOSPITAL_COMMUNITY): Payer: Self-pay

## 2017-01-27 DIAGNOSIS — Z7982 Long term (current) use of aspirin: Secondary | ICD-10-CM | POA: Diagnosis not present

## 2017-01-27 DIAGNOSIS — I10 Essential (primary) hypertension: Secondary | ICD-10-CM | POA: Insufficient documentation

## 2017-01-27 DIAGNOSIS — J069 Acute upper respiratory infection, unspecified: Secondary | ICD-10-CM | POA: Diagnosis not present

## 2017-01-27 DIAGNOSIS — Z7984 Long term (current) use of oral hypoglycemic drugs: Secondary | ICD-10-CM | POA: Insufficient documentation

## 2017-01-27 DIAGNOSIS — J029 Acute pharyngitis, unspecified: Secondary | ICD-10-CM | POA: Diagnosis not present

## 2017-01-27 DIAGNOSIS — F17210 Nicotine dependence, cigarettes, uncomplicated: Secondary | ICD-10-CM | POA: Insufficient documentation

## 2017-01-27 DIAGNOSIS — J449 Chronic obstructive pulmonary disease, unspecified: Secondary | ICD-10-CM | POA: Insufficient documentation

## 2017-01-27 DIAGNOSIS — Z79899 Other long term (current) drug therapy: Secondary | ICD-10-CM | POA: Diagnosis not present

## 2017-01-27 DIAGNOSIS — E119 Type 2 diabetes mellitus without complications: Secondary | ICD-10-CM | POA: Insufficient documentation

## 2017-01-27 LAB — RAPID STREP SCREEN (MED CTR MEBANE ONLY): Streptococcus, Group A Screen (Direct): NEGATIVE

## 2017-01-27 NOTE — ED Provider Notes (Signed)
Parents in grand.  She had to drag a year while they were taking the last breath now.  She has go ahead and get this checked [facility] EMERGENCY DEPARTMENT Provider Note   CSN: [id number] Arrival date & time: 01/27/17  1406     History   Chief Complaint Chief Complaint  Patient presents with  . Sore Throat    HPI Gina Bradley is a 56 y.o. female.  Patient is a 56 year old female who presents to the emergency department with a complaint of sore throat and cough.  The patient states that 5 days ago she started having problems with sore throat.  She noted some small white blisters around the edge of her mouth, and she complains of sensation of irritation in the back of her throat.  She also complains of some postnasal drip and cough.  She says she feels in her chest like she may be developing some bronchitis.  No hemoptysis reported.  No high fever reported.   The history is provided by the patient.  Sore Throat  Pertinent negatives include no chest pain, no abdominal pain and no shortness of breath.    Past Medical History:  Diagnosis Date  . Anemia   . Cervical disc disease   . COPD (chronic obstructive pulmonary disease) (HCC)   . Diabetes mellitus without complication (HCC)   . Essential hypertension   . GERD (gastroesophageal reflux disease)   . Heart failure (HCC) 2018  . History of cardiac catheterization    Normal coronaries March 2015  . History of non-ST elevation myocardial infarction (NSTEMI)    Secondary to SVT  . Peripheral neuropathy   . Spinal stenosis   . SVT (supraventricular tachycardia) [facility])     Patient Active Problem List   Diagnosis Date Noted  . PMB (postmenopausal bleeding) 10/06/2016  . Chest pressure 01/05/2015  . Gastric erosion   . Encounter for screening colonoscopy 01/22/2014  . Constipation 01/20/2014  . GERD (gastroesophageal reflux disease) 01/20/2014  . Dyspepsia 01/20/2014  . SVT (supraventricular tachycardia) (HCC)  05/31/2013  . HTN (hypertension), benign 05/31/2013  . Hypercholesteremia 05/31/2013  . COPD (chronic obstructive pulmonary disease) (HCC) 05/31/2013  . Cervical stenosis of spine 05/31/2013  . Peripheral neuropathy 05/31/2013    Past Surgical History:  Procedure Laterality Date  . CESAREAN SECTION    . ESOPHAGOGASTRODUODENOSCOPY N/A 02/10/2014   WUJ:[id number] tiny antral erosion; otherwise normal EGD. No explanation for patient's symptoms. Gallbladder needs further evaluation.  [name] LEFT HEART CATHETERIZATION WITH CORONARY ANGIOGRAM N/A 06/03/2013   Procedure: LEFT HEART CATHETERIZATION WITH CORONARY ANGIOGRAM;  Surgeon: Lesleigh Noe, MD;  Location: [facility] CATH LAB;  Service: Cardiovascular;  Laterality: N/A;    OB History    Gravida Para Term Preterm AB Living   2 2 2     2    SAB TAB Ectopic Multiple Live Births           2       Home Medications    Prior to Admission medications   Medication Sig Start Date End Date Taking? Authorizing Provider  albuterol (PROVENTIL HFA;VENTOLIN HFA) 108 (90 Base) MCG/ACT inhaler Inhale into the lungs every 6 (six) hours as needed for wheezing or shortness of breath.    [provider]  albuterol (PROVENTIL) (2.5 MG/3ML) 0.083% nebulizer solution Take 2.5 mg by nebulization 4 (four) times daily.    [provider]  ALPRAZolam Prudy Feeler) 1 MG tablet Take 1 mg by mouth 3 (three) times daily  as needed for anxiety or sleep. Anxiety 05/03/13   [provider]  amoxicillin-clavulanate (AUGMENTIN) 875-125 MG tablet Take 1 tablet by mouth 2 (two) times daily. 12/17/16   Rolland Porter, MD  ANORO ELLIPTA 62.5-25 MCG/INH AEPB Inhale 1 puff into the lungs daily.  09/09/16   [provider]  aspirin EC 81 MG EC tablet Take 1 tablet (81 mg total) by mouth daily. 06/03/13   Lonia Blood, MD  atorvastatin (LIPITOR) 20 MG tablet Take 20 mg by mouth every evening.  03/04/13   [provider]  co-enzyme Q-10 30 MG capsule Take 100  mg by mouth daily.    [provider]  cyclobenzaprine (FLEXERIL) 10 MG tablet Take 10 mg by mouth at bedtime as needed.  08/30/16   [provider]  doxycycline (VIBRAMYCIN) 100 MG capsule Take 100 mg by mouth at bedtime. 12/15/16   [provider]  EPINEPHrine (EPIPEN 2-PAK) 0.3 mg/0.3 mL IJ SOAJ injection Inject 0.3 mLs (0.3 mg total) into the muscle once as needed (for severe allergic reaction). CAll 911 immediately if you have to use this medicine 10/21/15   Eber Hong, MD  furosemide (LASIX) 20 MG tablet Take 20 mg by mouth 2 (two) times daily.    [provider]  gabapentin (NEURONTIN) 100 MG capsule Take 100 mg by mouth 3 (three) times daily. 05/25/13   [provider]  HYDROcodone-acetaminophen (NORCO) 10-325 MG per tablet Take 1 tablet by mouth every 6 (six) hours as needed. pain 05/03/13   [provider]  Investigational vitamin D 600 UNITS capsule SWOG [id number] Take 600 Units by mouth daily. Take with food.    [provider]  metFORMIN (GLUCOPHAGE) 500 MG tablet Take 500 mg by mouth as needed. 11/30/16   [provider]  metoprolol succinate (TOPROL-XL) 100 MG 24 hr tablet Take 1 tablet (100 mg total) by mouth daily. 11/25/16   Jodelle Gross, NP  nitroGLYCERIN (NITROSTAT) 0.4 MG SL tablet Place 1 tablet (0.4 mg total) under the tongue every 5 (five) minutes as needed for chest pain. 12/22/14   Rolland Porter, MD  Oxycodone HCl 10 MG TABS Take 5-10 mg by mouth daily as needed (for pain).  09/06/16   [provider]  pantoprazole (PROTONIX) 40 MG tablet Take 40 mg by mouth 2 (two) times daily. 05/25/13   [provider]  polyethylene glycol-electrolytes (TRILYTE) 420 g solution Take 4,000 mLs by mouth as directed. 12/27/16   Rourk, Gerrit Friends, MD  promethazine (PHENERGAN) 25 MG tablet Take 25 mg by mouth every 6 (six) hours as needed for nausea or vomiting.    [provider]  TOVIAZ 4 MG TB24 tablet  Take 4 mg by mouth daily.  09/21/16   [provider]  triamcinolone cream (KENALOG) 0.1 % Apply 1 application topically as needed (ITCHING/IRRITATION).  04/28/15   [provider]  VIT B6-VIT B12-OMEGA 3 ACIDS PO Take 500 mg by mouth daily.    [provider]    Family History Family History  Problem Relation Age of Onset  . Heart attack Mother   . Diabetes Mother   . Hypertension Mother   . Angina Mother   . Diabetes Father   . Hypertension Father   . Heart failure Brother   . Suicidality Sister   . Colon cancer Unknown        Possibly Dad   . Stroke Maternal Grandmother   . Heart failure Maternal Grandmother   .  Diabetes Maternal Grandfather   . Hypertension Daughter   . Hypertension Son     Social History Social History  Substance Use Topics  . Smoking status: Current Every Day Smoker    Packs/day: 0.50    Years: 18.00    Types: Cigarettes  . Smokeless tobacco: Never Used  . Alcohol use No     Allergies   Bee venom and Tdap [diphth-acell pertussis-tetanus]   Review of Systems Review of Systems  Constitutional: Negative for activity change.       All ROS Neg except as noted in HPI  HENT: Positive for congestion and sore throat. Negative for nosebleeds.   Eyes: Negative for photophobia and discharge.  Respiratory: Positive for cough. Negative for shortness of breath and wheezing.   Cardiovascular: Negative for chest pain and palpitations.  Gastrointestinal: Negative for abdominal pain and blood in stool.  Genitourinary: Negative for dysuria, frequency and hematuria.  Musculoskeletal: Negative for arthralgias, back pain and neck pain.  Skin: Negative.   Neurological: Negative for dizziness, seizures and speech difficulty.  Psychiatric/Behavioral: Negative for confusion and hallucinations.     Physical Exam Updated Vital Signs BP 139/84 (BP Location: Left Arm)   Pulse 83   Temp 98.9 F (37.2 C) (Oral)   Resp 20   Ht 5\' 1"   (1.549 m)   Wt 90.7 kg (200 lb)   LMP 01/05/2015   SpO2 98%   BMI 37.79 kg/m   Physical Exam  Constitutional: She is oriented to person, place, and time. She appears well-developed and well-nourished.  Non-toxic appearance.  HENT:  Head: Normocephalic.  Right Ear: Tympanic membrane and external ear normal.  Left Ear: Tympanic membrane and external ear normal.  Mouth/Throat: Uvula is midline. Posterior oropharyngeal erythema present. No oropharyngeal exudate, posterior oropharyngeal edema or tonsillar abscesses.  Nasal congestion present. No trismus noted. Small raw area at the corner of the right and left lower lip area.  Eyes: Pupils are equal, round, and reactive to light. EOM and lids are normal.  Neck: Normal range of motion. Neck supple. Carotid bruit is not present.  Cardiovascular: Normal rate, regular rhythm, normal heart sounds, intact distal pulses and normal pulses.   Pulmonary/Chest: Breath sounds normal. No respiratory distress.  Abdominal: Soft. Bowel sounds are normal. There is no tenderness. There is no guarding.  Musculoskeletal: Normal range of motion.  Lymphadenopathy:       Head (right side): No submandibular adenopathy present.       Head (left side): No submandibular adenopathy present.    She has no cervical adenopathy.  Neurological: She is alert and oriented to person, place, and time. She has normal strength. No cranial nerve deficit or sensory deficit.  Skin: Skin is warm and dry. No rash noted.  Psychiatric: She has a normal mood and affect. Her speech is normal.  Nursing note and vitals reviewed.    ED Treatments / Results  Labs (all labs ordered are listed, but only abnormal results are displayed) Labs Reviewed  RAPID STREP SCREEN (NOT AT [facility])    EKG  EKG Interpretation None       Radiology No results found.  Procedures Procedures (including critical care time)  Medications Ordered in ED Medications - No data to  display   Initial Impression / Assessment and Plan / ED Course  I have reviewed the triage vital signs and the nursing notes.  Pertinent labs & imaging results that were available during my care of the patient were reviewed by me  and considered in my medical decision making (see chart for details).       Final Clinical Impressions(s) / ED Diagnoses MDM Vital signs within normal limits.  Patient speaks clearly.  There is no trismus noted. No rash noted.  Patient in no distress.  Patient speaks clearly without problem.  Strep test is negative.  I discussed the findings with the patient in terms of which she understands.  She requested a shot of penicillin, I explained to the patient that this would not be effective against a viral illness.  I have asked the patient to use salt water gargles, Chloraseptic spray, Tylenol, and ibuprofen.  I have asked her to increase fluids and to use good handwashing.  We discussed the contagious nature of this problem.   Final diagnoses:  None    New Prescriptions New Prescriptions   No medications on file     Ivery Quale, Cordelia Poche 01/27/17 1550    Samuel Jester, DO 01/28/17 1009

## 2017-01-27 NOTE — Discharge Instructions (Signed)
Your vital signs are within normal limits.  Your oxygen level is 98% on room air.  Your strep test is negative.  Blood please use salt water gargles and Chloraseptic spray for your discomfort.  Please use Tylenol every 4 hours or ibuprofen every 6 hours for fever, chills, or aching.  Please see Dr.Fusco or return to the emergency department if any changes, problems, or concerns.

## 2017-01-27 NOTE — ED Triage Notes (Signed)
Sore throat x5 days with small white blisters around edge of mouth.

## 2017-01-30 ENCOUNTER — Ambulatory Visit (HOSPITAL_COMMUNITY)
Admission: RE | Admit: 2017-01-30 | Payer: BLUE CROSS/BLUE SHIELD | Source: Ambulatory Visit | Admitting: Internal Medicine

## 2017-01-30 ENCOUNTER — Encounter (HOSPITAL_COMMUNITY): Admission: RE | Payer: Self-pay | Source: Ambulatory Visit

## 2017-01-30 LAB — CULTURE, GROUP A STREP (THRC)

## 2017-01-30 SURGERY — COLONOSCOPY WITH PROPOFOL
Anesthesia: Monitor Anesthesia Care

## 2017-02-03 DIAGNOSIS — G894 Chronic pain syndrome: Secondary | ICD-10-CM | POA: Diagnosis not present

## 2017-02-03 DIAGNOSIS — J449 Chronic obstructive pulmonary disease, unspecified: Secondary | ICD-10-CM | POA: Diagnosis not present

## 2017-02-03 DIAGNOSIS — F419 Anxiety disorder, unspecified: Secondary | ICD-10-CM | POA: Diagnosis not present

## 2017-02-03 DIAGNOSIS — Z1389 Encounter for screening for other disorder: Secondary | ICD-10-CM | POA: Diagnosis not present

## 2017-02-03 DIAGNOSIS — Z6839 Body mass index (BMI) 39.0-39.9, adult: Secondary | ICD-10-CM | POA: Diagnosis not present

## 2017-02-03 DIAGNOSIS — E669 Obesity, unspecified: Secondary | ICD-10-CM | POA: Diagnosis not present

## 2017-02-03 DIAGNOSIS — K219 Gastro-esophageal reflux disease without esophagitis: Secondary | ICD-10-CM | POA: Diagnosis not present

## 2017-02-03 DIAGNOSIS — J042 Acute laryngotracheitis: Secondary | ICD-10-CM | POA: Diagnosis not present

## 2017-02-03 DIAGNOSIS — T783XXA Angioneurotic edema, initial encounter: Secondary | ICD-10-CM | POA: Diagnosis not present

## 2017-03-16 ENCOUNTER — Encounter: Payer: Self-pay | Admitting: Internal Medicine

## 2017-03-16 DIAGNOSIS — K130 Diseases of lips: Secondary | ICD-10-CM | POA: Diagnosis not present

## 2017-03-16 DIAGNOSIS — Z6839 Body mass index (BMI) 39.0-39.9, adult: Secondary | ICD-10-CM | POA: Diagnosis not present

## 2017-03-16 DIAGNOSIS — E119 Type 2 diabetes mellitus without complications: Secondary | ICD-10-CM | POA: Diagnosis not present

## 2017-03-16 DIAGNOSIS — J329 Chronic sinusitis, unspecified: Secondary | ICD-10-CM | POA: Diagnosis not present

## 2017-03-16 DIAGNOSIS — G894 Chronic pain syndrome: Secondary | ICD-10-CM | POA: Diagnosis not present

## 2017-03-16 DIAGNOSIS — B370 Candidal stomatitis: Secondary | ICD-10-CM | POA: Diagnosis not present

## 2017-03-16 DIAGNOSIS — T50905A Adverse effect of unspecified drugs, medicaments and biological substances, initial encounter: Secondary | ICD-10-CM | POA: Diagnosis not present

## 2017-03-16 DIAGNOSIS — I10 Essential (primary) hypertension: Secondary | ICD-10-CM | POA: Diagnosis not present

## 2017-04-13 DIAGNOSIS — Z6841 Body Mass Index (BMI) 40.0 and over, adult: Secondary | ICD-10-CM | POA: Diagnosis not present

## 2017-04-13 DIAGNOSIS — E119 Type 2 diabetes mellitus without complications: Secondary | ICD-10-CM | POA: Diagnosis not present

## 2017-04-13 DIAGNOSIS — J449 Chronic obstructive pulmonary disease, unspecified: Secondary | ICD-10-CM | POA: Diagnosis not present

## 2017-04-13 DIAGNOSIS — J441 Chronic obstructive pulmonary disease with (acute) exacerbation: Secondary | ICD-10-CM | POA: Diagnosis not present

## 2017-04-19 DIAGNOSIS — I10 Essential (primary) hypertension: Secondary | ICD-10-CM | POA: Diagnosis not present

## 2017-04-19 DIAGNOSIS — F419 Anxiety disorder, unspecified: Secondary | ICD-10-CM | POA: Diagnosis not present

## 2017-04-19 DIAGNOSIS — J441 Chronic obstructive pulmonary disease with (acute) exacerbation: Secondary | ICD-10-CM | POA: Diagnosis not present

## 2017-04-19 DIAGNOSIS — K219 Gastro-esophageal reflux disease without esophagitis: Secondary | ICD-10-CM | POA: Diagnosis not present

## 2017-04-25 DIAGNOSIS — J45901 Unspecified asthma with (acute) exacerbation: Secondary | ICD-10-CM | POA: Diagnosis not present

## 2017-04-25 DIAGNOSIS — J9801 Acute bronchospasm: Secondary | ICD-10-CM | POA: Diagnosis not present

## 2017-04-25 DIAGNOSIS — E1140 Type 2 diabetes mellitus with diabetic neuropathy, unspecified: Secondary | ICD-10-CM | POA: Diagnosis not present

## 2017-04-25 DIAGNOSIS — F329 Major depressive disorder, single episode, unspecified: Secondary | ICD-10-CM | POA: Diagnosis not present

## 2017-04-25 DIAGNOSIS — Z6841 Body Mass Index (BMI) 40.0 and over, adult: Secondary | ICD-10-CM | POA: Diagnosis not present

## 2017-04-25 DIAGNOSIS — K219 Gastro-esophageal reflux disease without esophagitis: Secondary | ICD-10-CM | POA: Diagnosis not present

## 2017-04-25 DIAGNOSIS — R5383 Other fatigue: Secondary | ICD-10-CM | POA: Diagnosis not present

## 2017-04-25 DIAGNOSIS — F419 Anxiety disorder, unspecified: Secondary | ICD-10-CM | POA: Diagnosis not present

## 2017-04-25 DIAGNOSIS — J329 Chronic sinusitis, unspecified: Secondary | ICD-10-CM | POA: Diagnosis not present

## 2017-04-25 DIAGNOSIS — E119 Type 2 diabetes mellitus without complications: Secondary | ICD-10-CM | POA: Diagnosis not present

## 2017-04-25 DIAGNOSIS — G894 Chronic pain syndrome: Secondary | ICD-10-CM | POA: Diagnosis not present

## 2017-04-25 DIAGNOSIS — I10 Essential (primary) hypertension: Secondary | ICD-10-CM | POA: Diagnosis not present

## 2017-04-25 DIAGNOSIS — Z1389 Encounter for screening for other disorder: Secondary | ICD-10-CM | POA: Diagnosis not present

## 2017-04-26 DIAGNOSIS — E1140 Type 2 diabetes mellitus with diabetic neuropathy, unspecified: Secondary | ICD-10-CM | POA: Diagnosis not present

## 2017-05-04 DIAGNOSIS — I10 Essential (primary) hypertension: Secondary | ICD-10-CM | POA: Diagnosis not present

## 2017-05-04 DIAGNOSIS — K219 Gastro-esophageal reflux disease without esophagitis: Secondary | ICD-10-CM | POA: Diagnosis not present

## 2017-05-04 DIAGNOSIS — J441 Chronic obstructive pulmonary disease with (acute) exacerbation: Secondary | ICD-10-CM | POA: Diagnosis not present

## 2017-05-04 DIAGNOSIS — E669 Obesity, unspecified: Secondary | ICD-10-CM | POA: Diagnosis not present

## 2017-05-09 DIAGNOSIS — G4733 Obstructive sleep apnea (adult) (pediatric): Secondary | ICD-10-CM | POA: Diagnosis not present

## 2017-05-12 ENCOUNTER — Encounter: Payer: Self-pay | Admitting: Internal Medicine

## 2017-05-12 DIAGNOSIS — J45909 Unspecified asthma, uncomplicated: Secondary | ICD-10-CM | POA: Diagnosis not present

## 2017-05-12 DIAGNOSIS — J449 Chronic obstructive pulmonary disease, unspecified: Secondary | ICD-10-CM | POA: Diagnosis not present

## 2017-05-12 DIAGNOSIS — G4733 Obstructive sleep apnea (adult) (pediatric): Secondary | ICD-10-CM | POA: Diagnosis not present

## 2017-05-29 ENCOUNTER — Ambulatory Visit: Payer: BLUE CROSS/BLUE SHIELD | Admitting: Internal Medicine

## 2017-05-31 ENCOUNTER — Encounter: Payer: Self-pay | Admitting: Internal Medicine

## 2017-05-31 ENCOUNTER — Telehealth: Payer: Self-pay | Admitting: Internal Medicine

## 2017-05-31 ENCOUNTER — Ambulatory Visit (INDEPENDENT_AMBULATORY_CARE_PROVIDER_SITE_OTHER): Payer: BLUE CROSS/BLUE SHIELD | Admitting: Internal Medicine

## 2017-05-31 VITALS — BP 108/68 | HR 98 | Ht 61.0 in | Wt 216.8 lb

## 2017-05-31 DIAGNOSIS — I890 Lymphedema, not elsewhere classified: Secondary | ICD-10-CM

## 2017-05-31 DIAGNOSIS — I10 Essential (primary) hypertension: Secondary | ICD-10-CM | POA: Diagnosis not present

## 2017-05-31 MED ORDER — COMPRESSION BANDAGES MISC: 1.0000 | 0 refills | Status: DC

## 2017-05-31 NOTE — Progress Notes (Signed)
HPI Ms. Glaus returns today for ongoing evaluation of SVT and chest pain. She is a pleasant middle aged woman with a h/o tachypalpitations and documented SVT. Over the last year she has had increased stress as her daughter has moved in to her house and she is helping care for her adult son. She is no longer working and she has gained 22 lbs. She admits to dietary indiscretion. Allergies  Allergen Reactions  . Bee Venom Other (See Comments)    Unknown  . Tdap [Diphth-Acell Pertussis-Tetanus] Other (See Comments)    Unknown     Current Outpatient Medications  Medication Sig Dispense Refill  . albuterol (PROVENTIL HFA;VENTOLIN HFA) 108 (90 Base) MCG/ACT inhaler Inhale into the lungs every 6 (six) hours as needed for wheezing or shortness of breath.    [name] albuterol (PROVENTIL) (2.5 MG/3ML) 0.083% nebulizer solution Take 2.5 mg by nebulization 4 (four) times daily.    [name] ALPRAZolam (XANAX) 1 MG tablet Take 1 mg by mouth 3 (three) times daily as needed for anxiety or sleep. Anxiety    . ANORO ELLIPTA 62.5-25 MCG/INH AEPB Inhale 1 puff into the lungs daily.   11  . aspirin EC 81 MG EC tablet Take 1 tablet (81 mg total) by mouth daily.    [name] atorvastatin (LIPITOR) 20 MG tablet Take 20 mg by mouth every evening.     [name] co-enzyme Q-10 30 MG capsule Take 100 mg by mouth daily.    . cyclobenzaprine (FLEXERIL) 10 MG tablet Take 10 mg by mouth at bedtime as needed.   3  . EPINEPHrine (EPIPEN 2-PAK) 0.3 mg/0.3 mL IJ SOAJ injection Inject 0.3 mLs (0.3 mg total) into the muscle once as needed (for severe allergic reaction). CAll 911 immediately if you have to use this medicine 2 Device 1  . furosemide (LASIX) 20 MG tablet Take 20 mg by mouth 2 (two) times daily.    [name] gabapentin (NEURONTIN) 100 MG capsule Take 100 mg by mouth 3 (three) times daily.    [name] HYDROcodone-acetaminophen (NORCO) 10-325 MG per tablet Take 1 tablet by mouth every 6 (six) hours as needed. pain    . Investigational vitamin D 600  UNITS capsule SWOG S0812 Take 600 Units by mouth daily. Take with food.    . metFORMIN (GLUCOPHAGE) 500 MG tablet Take 500 mg by mouth as needed.  10  . metoprolol succinate (TOPROL-XL) 100 MG 24 hr tablet Take 1 tablet (100 mg total) by mouth daily. 30 tablet 11  . nitroGLYCERIN (NITROSTAT) 0.4 MG SL tablet Place 1 tablet (0.4 mg total) under the tongue every 5 (five) minutes as needed for chest pain. 10 tablet 0  . Oxycodone HCl 10 MG TABS Take 5-10 mg by mouth daily as needed (for pain).   0  . pantoprazole (PROTONIX) 40 MG tablet Take 40 mg by mouth 2 (two) times daily.    . promethazine (PHENERGAN) 25 MG tablet Take 25 mg by mouth every 6 (six) hours as needed for nausea or vomiting.    . TOVIAZ 4 MG TB24 tablet Take 4 mg by mouth daily.   10  . triamcinolone cream (KENALOG) 0.1 % Apply 1 application topically as needed (ITCHING/IRRITATION).   2  . VIT B6-VIT B12-OMEGA 3 ACIDS PO Take 500 mg by mouth daily.    . polyethylene glycol-electrolytes (TRILYTE) 420 g solution Take 4,000 mLs by mouth as directed. 4000 mL 0   No current facility-administered medications for this visit.  Past Medical History:  Diagnosis Date  . Anemia   . Cervical disc disease   . COPD (chronic obstructive pulmonary disease) (HCC)   . Diabetes mellitus without complication (HCC)   . Essential hypertension   . GERD (gastroesophageal reflux disease)   . Heart failure (HCC) 2018  . History of cardiac catheterization    Normal coronaries March 2015  . History of non-ST elevation myocardial infarction (NSTEMI)    Secondary to SVT  . Peripheral neuropathy   . Spinal stenosis   . SVT (supraventricular tachycardia) (HCC)     ROS:   All systems reviewed and negative except as noted in the HPI.   Past Surgical History:  Procedure Laterality Date  . CESAREAN SECTION    . ESOPHAGOGASTRODUODENOSCOPY N/A 02/10/2014   RUE:[id number]:Single tiny antral erosion; otherwise normal EGD. No explanation for patient's  symptoms. Gallbladder needs further evaluation.  [name]. LEFT HEART CATHETERIZATION WITH CORONARY ANGIOGRAM N/A 06/03/2013   Procedure: LEFT HEART CATHETERIZATION WITH CORONARY ANGIOGRAM;  Surgeon: Lesleigh NoeHenry W Smith III, MD;  [facility];  Service: Cardiovascular;  Laterality: N/A;     Family History  Problem Relation Age of Onset  . Heart attack Mother   . Diabetes Mother   . Hypertension Mother   . Angina Mother   . Diabetes Father   . Hypertension Father   . Heart failure Brother   . Suicidality Sister   . Colon cancer Unknown        Possibly Dad   . Stroke Maternal Grandmother   . Heart failure Maternal Grandmother   . Diabetes Maternal Grandfather   . Hypertension Daughter   . Hypertension Son      Social History   Socioeconomic History  . Marital status: Married    Spouse name: Not on file  . Number of children: Not on file  . Years of education: Not on file  . Highest education level: Not on file  Social Needs  . Financial resource strain: Not on file  . Food insecurity - worry: Not on file  . Food insecurity - inability: Not on file  . Transportation needs - medical: Not on file  . Transportation needs - non-medical: Not on file  Occupational History  . Occupation: [employer]    Employer: [employer]  Tobacco Use  . Smoking status: Current Every Day Smoker    Packs/day: 0.50    Years: 18.00    Pack years: 9.00    Types: Cigarettes  . Smokeless tobacco: Never Used  Substance and Sexual Activity  . Alcohol use: No    Alcohol/week: 0.0 oz  . Drug use: No  . Sexual activity: Not Currently    Birth control/protection: Post-menopausal  Other Topics Concern  . Not on file  Social History Narrative  . Not on file     BP 108/68   Pulse 98   Ht 5\' 1"  (1.549 m)   Wt 216 lb 12.8 oz (98.3 kg)   LMP 01/05/2015   SpO2 96%   BMI 40.96 kg/m   Physical Exam:  Well appearing 57 yo woman, NAD HEENT: Unremarkable Neck:  6 cm JVD, no  thyromegally Lymphatics:  No adenopathy Back:  No CVA tenderness Lungs:  Clear with no wheezes HEART:  Regular rate rhythm, no murmurs, no rubs, no clicks Abd:  soft, positive bowel sounds, no organomegally, no rebound, no guarding Ext:  2 plus pulses, no edema, no cyanosis, no clubbing Skin:  No rashes no nodules Neuro:  CN II through XII intact, motor grossly intact  EKG - nsr   Assess/Plan: 1. SVT - she is mostly maintaining NSR and her SVT is controlled on medical therapy. 2. Obesity -  I have strongly encouraged the patient to lose weight.  3. HTN - her blood pressure is well controlled. She is encouraged to lose weight. 4. Diastolic heart failure - her symptoms are class. I have asked the patient to start exercising.   Leonia Reeves.D.

## 2017-05-31 NOTE — Patient Instructions (Signed)
Medication Instructions:  Your physician recommends that you continue on your current medications as directed. Please refer to the Current Medication list given to you today.   Labwork: NONE   Testing/Procedures: NONE   Follow-Up: Your physician wants you to follow-up in: 1 Year with Dr. Ladona Ridgelaylor. You will receive a reminder letter in the mail two months in advance. If you don't receive a letter, please call our office to schedule the follow-up appointment.   Any Other Special Instructions Will Be Listed Below (If Applicable). Support Hose  Weight loss Low Sodium Diet    If you need a refill on your cardiac medications before your next appointment, please call your pharmacy. Thank you for choosing [facility]!   Exercising to Lose Weight Exercising can help you to lose weight. In order to lose weight through exercise, you need to do vigorous-intensity exercise. You can tell that you are exercising with vigorous intensity if you are breathing very hard and fast and cannot hold a conversation while exercising. Moderate-intensity exercise helps to maintain your current weight. You can tell that you are exercising at a moderate level if you have a higher heart rate and faster breathing, but you are still able to hold a conversation. How often should I exercise? Choose an activity that you enjoy and set realistic goals. Your health care provider can help you to make an activity plan that works for you. Exercise regularly as directed by your health care provider. This may include:  Doing resistance training twice each week, such as: ? Push-ups. ? Sit-ups. ? Lifting weights. ? Using resistance bands.  Doing a given intensity of exercise for a given amount of time. Choose from these options: ? 150 minutes of moderate-intensity exercise every week. ? 75 minutes of vigorous-intensity exercise every week. ? A mix of moderate-intensity and vigorous-intensity exercise every  week.  Children, pregnant women, people who are out of shape, people who are overweight, and older adults may need to consult a health care provider for individual recommendations. If you have any sort of medical condition, be sure to consult your health care provider before starting a new exercise program. What are some activities that can help me to lose weight?  Walking at a rate of at least 4.5 miles an hour.  Jogging or running at a rate of 5 miles per hour.  Biking at a rate of at least 10 miles per hour.  Lap swimming.  Roller-skating or in-line skating.  Cross-country skiing.  Vigorous competitive sports, such as football, basketball, and soccer.  Jumping rope.  Aerobic dancing. How can I be more active in my day-to-day activities?  Use the stairs instead of the elevator.  Take a walk during your lunch break.  If you drive, park your car farther away from work or school.  If you take public transportation, get off one stop early and walk the rest of the way.  Make all of your phone calls while standing up and walking around.  Get up, stretch, and walk around every 30 minutes throughout the day. What guidelines should I follow while exercising?  Do not exercise so much that you hurt yourself, feel dizzy, or get very short of breath.  Consult your health care provider prior to starting a new exercise program.  Wear comfortable clothes and shoes with good support.  Drink plenty of water while you exercise to prevent dehydration or heat stroke. Body water is lost during exercise and must be replaced.  Work  out until you breathe faster and your heart beats faster. This information is not intended to replace advice given to you by your health care provider. Make sure you discuss any questions you have with your health care provider. Document Released: 04/23/2010 Document Revised: 08/27/2015 Document Reviewed: 08/22/2013 Elsevier Interactive Patient Education  2018  Elsevier Inc[employer]  Low-Sodium Eating Plan Sodium, which is an element that makes up salt, helps you maintain a healthy balance of fluids in your body. Too much sodium can increase your blood pressure and cause fluid and waste to be held in your body. Your health care provider or dietitian may recommend following this plan if you have high blood pressure (hypertension), kidney disease, liver disease, or heart failure. Eating less sodium can help lower your blood pressure, reduce swelling, and protect your heart, liver, and kidneys. What are tips for following this plan? General guidelines  Most people on this plan should limit their sodium intake to 1,500-2,000 mg (milligrams) of sodium each day. Reading food labels  The Nutrition Facts label lists the amount of sodium in one serving of the food. If you eat more than one serving, you must multiply the listed amount of sodium by the number of servings.  Choose foods with less than 140 mg of sodium per serving.  Avoid foods with 300 mg of sodium or more per serving. Shopping  Look for lower-sodium products, often labeled as "low-sodium" or "no salt added."  Always check the sodium content even if foods are labeled as "unsalted" or "no salt added".  Buy fresh foods. ? Avoid canned foods and premade or frozen meals. ? Avoid canned, cured, or processed meats  Buy breads that have less than 80 mg of sodium per slice. Cooking  Eat more home-cooked food and less restaurant, buffet, and fast food.  Avoid adding salt when cooking. Use salt-free seasonings or herbs instead of table salt or sea salt. Check with your health care provider or pharmacist before using salt substitutes.  Cook with plant-based oils, such as canola, sunflower, or olive oil. Meal planning  When eating at a restaurant, ask that your food be prepared with less salt or no salt, if possible.  Avoid foods that contain MSG (monosodium glutamate). MSG is sometimes added to  [place] food, bouillon, and some canned foods. What foods are recommended? The items listed may not be a complete list. Talk with your dietitian about what dietary choices are best for you. Grains Low-sodium cereals, including oats, puffed wheat and rice, and shredded wheat. Low-sodium crackers. Unsalted rice. Unsalted pasta. Low-sodium bread. Whole-grain breads and whole-grain pasta. Vegetables Fresh or frozen vegetables. "No salt added" canned vegetables. "No salt added" tomato sauce and paste. Low-sodium or reduced-sodium tomato and vegetable juice. Fruits Fresh, frozen, or canned fruit. Fruit juice. Meats and other protein foods Fresh or frozen (no salt added) meat, poultry, seafood, and fish. Low-sodium canned tuna and salmon. Unsalted nuts. Dried peas, beans, and lentils without added salt. Unsalted canned beans. Eggs. Unsalted nut butters. Dairy Milk. Soy milk. Cheese that is naturally low in sodium, such as ricotta cheese, fresh mozzarella, or Swiss cheese Low-sodium or reduced-sodium cheese. Cream cheese. Yogurt. Fats and oils Unsalted butter. Unsalted margarine with no trans fat. Vegetable oils such as canola or olive oils. Seasonings and other foods Fresh and dried herbs and spices. Salt-free seasonings. Low-sodium mustard and ketchup. Sodium-free salad dressing. Sodium-free light mayonnaise. Fresh or refrigerated horseradish. Lemon juice. Vinegar. Homemade, reduced-sodium, or low-sodium soups. Unsalted popcorn and pretzels.  Low-salt or salt-free chips. What foods are not recommended? The items listed may not be a complete list. Talk with your dietitian about what dietary choices are best for you. Grains Instant hot cereals. Bread stuffing, pancake, and biscuit mixes. Croutons. Seasoned rice or pasta mixes. Noodle soup cups. Boxed or frozen macaroni and cheese. Regular salted crackers. Self-rising flour. Vegetables Sauerkraut, pickled vegetables, and relishes. Olives. [place] fries.  Onion rings. Regular canned vegetables (not low-sodium or reduced-sodium). Regular canned tomato sauce and paste (not low-sodium or reduced-sodium). Regular tomato and vegetable juice (not low-sodium or reduced-sodium). Frozen vegetables in sauces. Meats and other protein foods Meat or fish that is salted, canned, smoked, spiced, or pickled. Bacon, ham, sausage, hotdogs, corned beef, chipped beef, packaged lunch meats, salt pork, jerky, pickled herring, anchovies, regular canned tuna, sardines, salted nuts. Dairy Processed cheese and cheese spreads. Cheese curds. Blue cheese. Feta cheese. String cheese. Regular cottage cheese. Buttermilk. Canned milk. Fats and oils Salted butter. Regular margarine. Ghee. Bacon fat. Seasonings and other foods Onion salt, garlic salt, seasoned salt, table salt, and sea salt. Canned and packaged gravies. Worcestershire sauce. Tartar sauce. Barbecue sauce. Teriyaki sauce. Soy sauce, including reduced-sodium. Steak sauce. Fish sauce. Oyster sauce. Cocktail sauce. Horseradish that you find on the shelf. Regular ketchup and mustard. Meat flavorings and tenderizers. Bouillon cubes. Hot sauce and Tabasco sauce. Premade or packaged marinades. Premade or packaged taco seasonings. Relishes. Regular salad dressings. Salsa. Potato and tortilla chips. Corn chips and puffs. Salted popcorn and pretzels. Canned or dried soups. Pizza. Frozen entrees and pot pies. Summary  Eating less sodium can help lower your blood pressure, reduce swelling, and protect your heart, liver, and kidneys.  Most people on this plan should limit their sodium intake to 1,500-2,000 mg (milligrams) of sodium each day.  Canned, boxed, and frozen foods are high in sodium. Restaurant foods, fast foods, and pizza are also very high in sodium. You also get sodium by adding salt to food.  Try to cook at home, eat more fresh fruits and vegetables, and eat less fast food, canned, processed, or prepared foods. This  information is not intended to replace advice given to you by your health care provider. Make sure you discuss any questions you have with your health care provider. Document Released: 09/10/2001 Document Revised: 03/14/2016 Document Reviewed: 03/14/2016 Elsevier Interactive Patient Education  [employer].

## 2017-05-31 NOTE — Telephone Encounter (Signed)
Would like a referral to the lymphedema clinic

## 2017-06-01 NOTE — Telephone Encounter (Signed)
Will forward to Dr. Taylor.  

## 2017-06-02 NOTE — Telephone Encounter (Signed)
I did not know we had a lymphadema clinic. Ok to send. GT

## 2017-06-06 DIAGNOSIS — G4733 Obstructive sleep apnea (adult) (pediatric): Secondary | ICD-10-CM | POA: Diagnosis not present

## 2017-06-06 NOTE — Addendum Note (Signed)
Addended by: Kerney ElbePINNIX, Tramond Slinker G on: 06/06/2017 09:09 AM   Modules accepted: Orders

## 2017-06-15 ENCOUNTER — Encounter (HOSPITAL_COMMUNITY): Payer: Self-pay | Admitting: Physical Therapy

## 2017-06-15 ENCOUNTER — Other Ambulatory Visit: Payer: Self-pay

## 2017-06-15 ENCOUNTER — Ambulatory Visit (HOSPITAL_COMMUNITY): Payer: Medicare Other | Attending: Internal Medicine | Admitting: Physical Therapy

## 2017-06-15 DIAGNOSIS — X58XXXD Exposure to other specified factors, subsequent encounter: Secondary | ICD-10-CM | POA: Insufficient documentation

## 2017-06-15 DIAGNOSIS — S91001D Unspecified open wound, right ankle, subsequent encounter: Secondary | ICD-10-CM | POA: Insufficient documentation

## 2017-06-15 DIAGNOSIS — S81802S Unspecified open wound, left lower leg, sequela: Secondary | ICD-10-CM

## 2017-06-15 DIAGNOSIS — R262 Difficulty in walking, not elsewhere classified: Secondary | ICD-10-CM | POA: Diagnosis not present

## 2017-06-15 DIAGNOSIS — S81001D Unspecified open wound, right knee, subsequent encounter: Secondary | ICD-10-CM

## 2017-06-15 DIAGNOSIS — S81802D Unspecified open wound, left lower leg, subsequent encounter: Secondary | ICD-10-CM | POA: Diagnosis not present

## 2017-06-15 DIAGNOSIS — I890 Lymphedema, not elsewhere classified: Secondary | ICD-10-CM | POA: Diagnosis not present

## 2017-06-15 DIAGNOSIS — S81801D Unspecified open wound, right lower leg, subsequent encounter: Secondary | ICD-10-CM | POA: Insufficient documentation

## 2017-06-15 NOTE — Addendum Note (Signed)
Addended by: Bella KennedyUSSELL, Latorria Zeoli J on: 06/15/2017 05:04 PM   Modules accepted: Orders

## 2017-06-15 NOTE — Therapy (Signed)
[facility] [address] [place], [place], [address] Phone: [phone]   Fax:  [phone]  Physical Therapy Evaluation  Patient Details  Name: Gina Bradley MRN: [id number] Date of Birth: [birth year] Referring Provider: Lewayne Bunting   Encounter Date: 06/15/2017  PT End of Session - 06/15/17 1513    Visit Number  1    Number of Visits  18    Date for PT Re-Evaluation  07/31/17 Reassess weekly on Wed.     Authorization - Visit Number  1    Authorization - Number of Visits  18    PT Start Time  1330 Pt late went the the hospital     PT Stop Time  1500    PT Time Calculation (min)  90 min    Activity Tolerance  Patient tolerated treatment well    Behavior During Therapy  WFL for tasks assessed/performed       Past Medical History:  Diagnosis Date  . Anemia   . Cervical disc disease   . COPD (chronic obstructive pulmonary disease) (HCC)   . Diabetes mellitus without complication (HCC)   . Essential hypertension   . GERD (gastroesophageal reflux disease)   . Heart failure (HCC) 2018  . History of cardiac catheterization    Normal coronaries March 2015  . History of non-ST elevation myocardial infarction (NSTEMI)    Secondary to SVT  . Peripheral neuropathy   . Spinal stenosis   . SVT (supraventricular tachycardia) (HCC)     Past Surgical History:  Procedure Laterality Date  . CESAREAN SECTION    . ESOPHAGOGASTRODUODENOSCOPY N/A 02/10/2014   ONG:EXBMWU tiny antral erosion; otherwise normal EGD. No explanation for patient's symptoms. Gallbladder needs further evaluation.  [name] LEFT HEART CATHETERIZATION WITH CORONARY ANGIOGRAM N/A 06/03/2013   Procedure: LEFT HEART CATHETERIZATION WITH CORONARY ANGIOGRAM;  Surgeon: Lesleigh Noe, MD;  Location: [facility] CATH LAB;  Service: Cardiovascular;  Laterality: N/A;    There were no vitals filed for this visit.   Subjective Assessment - 06/15/17 1338    Subjective  Ms. Kleckley states that  she was diagnosed with lymphedema approximately three weeks ago from her cardiologist who referred her to physical therapy.   She has been wearing compression garments from [facility] for approximately three days.She feels as though they have really given her relief but they are digging into her and she believes they may have caused a wound. .   She states that she has increased swelling in her neck as well but is on prednisone intermittently for her COPD.[name]     Pertinent History  COPD, DM, HTN,     How long can you walk comfortably?  walking with a rollator for a month now;  longest she can walk is less than five minutes.      Currently in Pain?  No/denies occasionally has pain in her heels          [facility] PT Assessment - 06/15/17 0001      Assessment   Medical Diagnosis  B LE Lymphedema; head and neck lymphedema     Referring Provider  Lewayne Bunting    Next MD Visit  06/03/2018    Prior Therapy  none       Precautions   Precautions  None        LYMPHEDEMA/ONCOLOGY QUESTIONNAIRE - 06/15/17 1357      What other symptoms do you have   Are you Having Heaviness or Tightness  Yes  (Pended)     Are you having Pain  Yes  (Pended)     Are you having pitting edema  Yes  (Pended)     Is it Hard or Difficult finding clothes that fit  Yes  (Pended)     Do you have infections  No  (Pended)     Stemmer Sign  No  (Pended)       Lymphedema Stage   Stage  STAGE 2 SPONTANEOUSLY IRREVERSIBLE  (Pended)       Lymphedema Assessments   Lymphedema Assessments  Lower extremities  (Pended)       Right Lower Extremity Lymphedema   20 cm Proximal to Suprapatella  71.8 cm  (Pended)     10 cm Proximal to Suprapatella  59.5 cm  (Pended)     At Midpatella/Popliteal Crease  45.3 cm  (Pended)     30 cm Proximal to Floor at Lateral Plantar Foot  41.2 cm  (Pended)     20 cm Proximal to Floor at Lateral Plantar Foot  34 1  (Pended)     10 cm Proximal to Floor at Lateral Malleoli  30.3 cm  (Pended)      Circumference of ankle/heel  35.2 cm.  (Pended)     5 cm Proximal to 1st MTP Joint  28.2 cm  (Pended)     Across MTP Joint  25.7 cm  (Pended)       Left Lower Extremity Lymphedema   20 cm Proximal to Suprapatella  72 cm  (Pended)     10 cm Proximal to Suprapatella  59.5 cm  (Pended)     At Midpatella/Popliteal Crease  43.8 cm  (Pended)     30 cm Proximal to Floor at Lateral Plantar Foot  41 cm  (Pended)     20 cm Proximal to Floor at Lateral Plantar Foot  34.3 cm  (Pended)     10 cm Proximal to Floor at Lateral Malleoli  28.8 cm  (Pended)     Circumference of ankle/heel  34.5 cm.  (Pended)     5 cm Proximal to 1st MTP Joint  27.3 cm  (Pended)     Across MTP Joint  24.8 cm  (Pended)     Other  wound inside of LT ankle .6cm diameter draining  (Pended)           Objective measurements completed on examination: See above findings.      [facility] Adult PT Treatment/Exercise - 06/15/17 0001      Manual Therapy   Manual Therapy  Compression Bandaging    Manual therapy comments  done seperate from all other aspects of treatment.    Compression Bandaging  All wounds were dressed with honey and 2x2 followed by multilayer compression bandaging using profore with extra cotton used for proper shaping, Pt instructed on self  manual for thigh area due to fluid being pushed to thigh.              PT Education - 06/15/17 1514    Education Details  Lymphedema how it is managed not cured, self massage and exercises, do not get profore wet and take off if pain increases     Person(s) Educated  Patient    Methods  Explanation    Comprehension  Verbalized understanding;Returned demonstration;Need further instruction       PT Short Term Goals - 06/15/17 1647      PT SHORT TERM GOAL #1   Title  PT woundson B LE  to have stopped weeping to decrease risk of infection     Time  1    Period  Weeks    Status  New    Target Date  06/22/17      PT SHORT TERM GOAL #2   Title  PT wounds on B LE   to be 100% granulated     Time  2    Period  Weeks    Status  New    Target Date  06/29/17      PT SHORT TERM GOAL #3   Title  PT volume to be decreased by 1.5 cm to allow pt to lift legs onto the beds with greater ease.     Time  2    Period  Weeks    Status  New        PT Long Term Goals - 06/15/17 1649      PT LONG TERM GOAL #1   Title  Pt volume to be decreased by 3 cm to allow pt to don and doff clothes easier.     Time  6    Period  Weeks    Status  New    Target Date  07/27/17      PT LONG TERM GOAL #2   Title  PT wounds to be healed to decrease risk of infection     Time  4    Period  Weeks    Status  New    Target Date  07/13/17      PT LONG TERM GOAL #3   Title  Pt pain in LE to be no greater than a 3/10 to allow walking to be easier ( pt to be able to walk for five-10 minute without leg pain) . Pt is also limited due to her COPd             Plan - 06/15/17 1636    Clinical Impression Statement  Ms. Fyfe is a 57 yo female who was diagnosed with lymphedema about three weeks ago although she states that she has been having issues with her leg swelling for years.  Her lymphedema appears to be hereditary as she states that her mother, aunt and daughter all had swelling.  She read on line that compression garments would help therefore she got a prescription and has purchased them.  She currently has them donned , however, due to having dorsal humps they are ill fitting and have caused wounds on both of her legs with the wound on her right ankle weeping.  All wounds have slough and an order for wound care has been sent to the MD.  Ms. Barns will benefit from skilled physical therapy to improve her knowledge of lymphedema, and how to manage it as well as compression bandaging and decongestive techniques to decrease the pt volume prior to donning compression garments so they will fit correctly.  She will also benefit from debridement to her wounds to create a healing  environment for her wounds.     History and Personal Factors relevant to plan of care:  COPD which she is on prednisone for intermittently; + smoker.     Clinical Presentation  Evolving    Clinical Decision Making  Moderate    Rehab Potential  Good    PT Frequency  3x / week    PT Duration  6 weeks    PT Treatment/Interventions  Patient/family education;Manual techniques;Manual lymph drainage;Compression bandaging  PT Next Visit Plan  Check on order for wound care begin debridment if it has been recieved.  Begin manual techniques for B LE decongestion; Once order has been recieved get pt ht and wt and [facility]  Once wounds have stopped draining and short stretch bandages have been recieved cut foam and begin multilayer short stretch bandaging to thigh level B.     Consulted and Agree with Plan of Care  Patient       Patient will benefit from skilled therapeutic intervention in order to improve the following deficits and impairments:  Cardiopulmonary status limiting activity, Decreased activity tolerance, Decreased strength, Difficulty walking, Pain, Increased edema, Other (comment)(open wounds)  Visit Diagnosis: Lymphedema, not elsewhere classified  Difficulty in walking, not elsewhere classified  Open wound of right leg, and ankle with complication, subsequent encounter  Open wound of left lower leg with complication, sequela     Problem List Patient Active Problem List   Diagnosis Date Noted  . PMB (postmenopausal bleeding) 10/06/2016  . Chest pressure 01/05/2015  . Gastric erosion   . Encounter for screening colonoscopy 01/22/2014  . Constipation 01/20/2014  . GERD (gastroesophageal reflux disease) 01/20/2014  . Dyspepsia 01/20/2014  . SVT (supraventricular tachycardia) (HCC) 05/31/2013  . HTN (hypertension), benign 05/31/2013  . Hypercholesteremia 05/31/2013  . COPD (chronic obstructive pulmonary disease) (HCC) 05/31/2013  . Cervical  stenosis of spine 05/31/2013  . Peripheral neuropathy 05/31/2013   Virgina Organynthia Russell, PT CLT [phone] 06/15/2017, 4:58 PM  [facility] [address] [place], [place], [address] Phone: [phone]   Fax:  [phone]  Name: Beaulah Dinningngela J Matlin MRN: [id number] Date of Birth: 02-25-1961

## 2017-06-16 ENCOUNTER — Telehealth (HOSPITAL_COMMUNITY): Payer: Self-pay | Admitting: Physical Therapy

## 2017-06-16 ENCOUNTER — Other Ambulatory Visit: Payer: Self-pay | Admitting: Internal Medicine

## 2017-06-16 NOTE — Telephone Encounter (Signed)
Rep. Stephannie Petersabatha, Ref.# [id number] Spoke to Elly ModenaJodie Brand DSS-she states patient has [employer] and Medicaid. Copy A[employer] in computer Jodie will give pt new Medcaid Card to bring here. Medicaid #[id number] O.Verlene MayerGave Mandy information to put in compute - It was late at 5pm when I got the call and a lot was going on at the front and I did not get it in epic. NF 06/15/17

## 2017-06-19 ENCOUNTER — Ambulatory Visit (HOSPITAL_COMMUNITY): Payer: Medicare Other | Admitting: Physical Therapy

## 2017-06-19 ENCOUNTER — Telehealth (HOSPITAL_COMMUNITY): Payer: Self-pay | Admitting: Internal Medicine

## 2017-06-19 DIAGNOSIS — Z6841 Body Mass Index (BMI) 40.0 and over, adult: Secondary | ICD-10-CM | POA: Diagnosis not present

## 2017-06-19 DIAGNOSIS — G894 Chronic pain syndrome: Secondary | ICD-10-CM | POA: Diagnosis not present

## 2017-06-19 DIAGNOSIS — J449 Chronic obstructive pulmonary disease, unspecified: Secondary | ICD-10-CM | POA: Diagnosis not present

## 2017-06-19 DIAGNOSIS — I10 Essential (primary) hypertension: Secondary | ICD-10-CM | POA: Diagnosis not present

## 2017-06-19 DIAGNOSIS — I890 Lymphedema, not elsewhere classified: Secondary | ICD-10-CM | POA: Diagnosis not present

## 2017-06-19 DIAGNOSIS — E669 Obesity, unspecified: Secondary | ICD-10-CM | POA: Diagnosis not present

## 2017-06-19 NOTE — Telephone Encounter (Signed)
06/19/17  pt was on the schedule for today and tomorrow and I called to see which she was coming too.  she CX today because she had so many other drs appts

## 2017-06-20 ENCOUNTER — Telehealth (HOSPITAL_COMMUNITY): Payer: Self-pay | Admitting: Physical Therapy

## 2017-06-20 ENCOUNTER — Ambulatory Visit (HOSPITAL_COMMUNITY): Payer: Medicare Other | Admitting: Physical Therapy

## 2017-06-20 ENCOUNTER — Telehealth (HOSPITAL_COMMUNITY): Payer: Self-pay | Admitting: Internal Medicine

## 2017-06-20 NOTE — Telephone Encounter (Signed)
06/20/17  Pt left a message that she had to take her daughter on an emergency run and couldn't come this morning.  She wanted to come this afternoon if we had any times available.

## 2017-06-20 NOTE — Telephone Encounter (Addendum)
Pt left voicemail to cancel today's appt due to conflicting event with her daughter.  Called and spoke to patient to remind her of her appt tomorrow at 1pm.  Pt stated she could not make early appts.  Let Angelica ChessmanMandy know this.   Signed MD Order received for woundcare/debridement, garments and pump. Obtained height and weight from the chart and [facility] 11:15 am 06/20/17. Lurena NidaAmy B Aurore Redinger, PTA/CLT [phone]

## 2017-06-21 ENCOUNTER — Ambulatory Visit: Payer: Medicare Other | Admitting: Urology

## 2017-06-21 ENCOUNTER — Ambulatory Visit (HOSPITAL_COMMUNITY): Payer: Medicare Other | Admitting: Physical Therapy

## 2017-06-21 DIAGNOSIS — I890 Lymphedema, not elsewhere classified: Secondary | ICD-10-CM

## 2017-06-21 DIAGNOSIS — S81802D Unspecified open wound, left lower leg, subsequent encounter: Secondary | ICD-10-CM | POA: Diagnosis not present

## 2017-06-21 DIAGNOSIS — S81001D Unspecified open wound, right knee, subsequent encounter: Secondary | ICD-10-CM | POA: Diagnosis not present

## 2017-06-21 DIAGNOSIS — R262 Difficulty in walking, not elsewhere classified: Secondary | ICD-10-CM | POA: Diagnosis not present

## 2017-06-21 DIAGNOSIS — S91001D Unspecified open wound, right ankle, subsequent encounter: Secondary | ICD-10-CM | POA: Diagnosis not present

## 2017-06-21 DIAGNOSIS — S81801D Unspecified open wound, right lower leg, subsequent encounter: Secondary | ICD-10-CM

## 2017-06-21 NOTE — Therapy (Addendum)
[facility] [address] [place], [place], [address] Phone: [phone]   Fax:  [phone]  Physical Therapy Treatment  Patient Details  Name: Gina Bradley MRN: [id number] Date of Birth: December 26, 1960 Referring Provider: Lewayne Bunting   Encounter Date: 06/21/2017  PT End of Session - 06/21/17 1507    Visit Number  2    Number of Visits  18    Date for PT Re-Evaluation  07/31/17 Reassess weekly on Wed.     Authorization - Visit Number  1    Authorization - Number of Visits  18    PT Start Time  1320    PT Stop Time  1425    PT Time Calculation (min)  65 min    Activity Tolerance  Patient tolerated treatment well    Behavior During Therapy  WFL for tasks assessed/performed       Past Medical History:  Diagnosis Date  . Anemia   . Cervical disc disease   . COPD (chronic obstructive pulmonary disease) (HCC)   . Diabetes mellitus without complication (HCC)   . Essential hypertension   . GERD (gastroesophageal reflux disease)   . Heart failure (HCC) 2018  . History of cardiac catheterization    Normal coronaries March 2015  . History of non-ST elevation myocardial infarction (NSTEMI)    Secondary to SVT  . Peripheral neuropathy   . Spinal stenosis   . SVT (supraventricular tachycardia) (HCC)     Past Surgical History:  Procedure Laterality Date  . CESAREAN SECTION    . ESOPHAGOGASTRODUODENOSCOPY N/A 02/10/2014   ONG:EXBMWU tiny antral erosion; otherwise normal EGD. No explanation for patient's symptoms. Gallbladder needs further evaluation.  [name] LEFT HEART CATHETERIZATION WITH CORONARY ANGIOGRAM N/A 06/03/2013   Procedure: LEFT HEART CATHETERIZATION WITH CORONARY ANGIOGRAM;  Surgeon: Lesleigh Noe, MD;  Location: [facility] CATH LAB;  Service: Cardiovascular;  Laterality: N/A;    There were no vitals filed for this visit.  Subjective Assessment - 06/21/17 1443    Subjective  Pt was 20 minutes late for appt.  STates her legs already  feel better and she is grateful she has found [place].  States she has been urinating alot more since her first visit and her legs aren't hurting like they were.     Currently in Pain?  No/denies            LYMPHEDEMA/ONCOLOGY QUESTIONNAIRE - 06/21/17 1454      Lymphedema Assessments   Lymphedema Assessments  Lower extremities      Right Lower Extremity Lymphedema   20 cm Proximal to Suprapatella  71 cm was 71.8    10 cm Proximal to Suprapatella  63 cm was 59.5    At Midpatella/Popliteal Crease  44 cm was 45.3    30 cm Proximal to Floor at Lateral Plantar Foot  42 cm was 41.2    20 cm Proximal to Floor at Lateral Plantar Foot  33.5 1 was 34    10 cm Proximal to Floor at Lateral Malleoli  23.5 cm was 30.3    Circumference of ankle/heel  31 cm. was 35.2    5 cm Proximal to 1st MTP Joint  26 cm was 28.2    Across MTP Joint  24 cm was 25.7    Around Proximal Great Toe  9 cm      Left Lower Extremity Lymphedema   20 cm Proximal to Suprapatella  68.4 cm was 72    10  cm Proximal to Suprapatella  63.7 cm was 59.5    At Midpatella/Popliteal Crease  46 cm was 43.8    30 cm Proximal to Floor at Lateral Plantar Foot  44 cm was 41    20 cm Proximal to Floor at Lateral Plantar Foot  34.5 cm was 34.3    10 cm Proximal to Floor at Lateral Malleoli  23.7 cm 28.8    Circumference of ankle/heel  30 cm. was 34.5    5 cm Proximal to 1st MTP Joint  24.7 cm was 27.3    Across MTP Joint  23.2 cm was 24.8    Around Proximal Great Toe  8 cm               [facility] Adult PT Treatment/Exercise - 06/21/17 0001      Manual Therapy   Manual Therapy  Compression Bandaging;Other (comment)    Manual therapy comments  done seperate from all other aspects of treatment.    Compression Bandaging  profore bilaterally following cleansing and moisturizing LE's    Other Manual Therapy  Rt Posterior LE wound:  0.5cm@ 50% slough, 50% granulated, minimal serosanginous drainage no odor.  Dressed with medihoney gel  and gauze              PT Education - 06/21/17 1507    Education provided  Yes    Education Details  reviewed goals per intitial evaluation and discussed treatment plan going forward.  margie wood from [facility] also educated on compression pump.     Person(s) Educated  Patient    Methods  Explanation;Demonstration;Tactile cues;Verbal cues;Handout    Comprehension  Verbalized understanding;Returned demonstration       PT Short Term Goals - 06/21/17 1508      PT SHORT TERM GOAL #1   Title  PT woundson B LE  to have stopped weeping to decrease risk of infection     Time  1    Period  Weeks    Status  On-going      PT SHORT TERM GOAL #2   Title  PT wounds on B LE  to be 100% granulated     Time  2    Period  Weeks    Status  On-going      PT SHORT TERM GOAL #3   Title  PT volume to be decreased by 1.5 cm to allow pt to lift legs onto the beds with greater ease.     Time  2    Period  Weeks    Status  On-going        PT Long Term Goals - 06/21/17 1508      PT LONG TERM GOAL #1   Title  Pt volume to be decreased by 3 cm to allow pt to don and doff clothes easier.     Time  6    Period  Weeks    Status  On-going      PT LONG TERM GOAL #2   Title  PT wounds to be healed to decrease risk of infection     Time  4    Period  Weeks    Status  On-going      PT LONG TERM GOAL #3   Title  Pt pain in LE to be no greater than a 3/10 to allow walking to be easier ( pt to be able to walk for five-10 minute without leg pain) . Pt is also limited due  to her COPd    Status  On-going            Plan - 06/21/17 1611    Clinical Impression Statement  signed order received to begin woundcare/debridement as well as compression garment and pump order.  Reviewed goals per initial evaluation and discussed general lymphedema POC and questions. Pt given copy of intial evaluation and also the order for her compression garments.   Delma PostMargie Wood from [employer] was here with  pateint's pump and gave her a 10 minute instruction and ensured sleeves fit her LE's well.  Unable to begin manual techniques this session as patient was late and had rep here for pump.  Did measure LE's with noted improvement/reduction around ankles 5-7 cm.  the knee and 10cm below/above had increased induration and edema today per measurements.  Explained to pateint she will likely need treatment into thighs as well.  Educated on asset or spanx she could purchase to help with her abdominal region.  Pt verbalized understanding.  All wounds are now healed except small wound approx 0.5cm2 posterior Rt LE.  continued with medihoney gel to this wound following cleansing and application of lotion.  Profore used bilaterallly from compression with use of cotton to create cone shape for compression.      Rehab Potential  Good    PT Frequency  3x / week    PT Duration  6 weeks    PT Treatment/Interventions  Patient/family education;Manual techniques;Manual lymph drainage;Compression bandaging    PT Next Visit Plan  Begin manual techniques for B LE decongestion.  Once short stretch has been received, begin multilayer short stretch bandaging to thigh level B.  Next session cut foam.      Consulted and Agree with Plan of Care  Patient       Patient will benefit from skilled therapeutic intervention in order to improve the following deficits and impairments:  Cardiopulmonary status limiting activity, Decreased activity tolerance, Decreased strength, Difficulty walking, Pain, Increased edema, Other (comment)(open wounds)  Visit Diagnosis: Lymphedema, not elsewhere classified  Difficulty in walking, not elsewhere classified  Open wound of right leg, and ankle with complication, subsequent encounter       Problem List Patient Active Problem List   Diagnosis Date Noted  . PMB (postmenopausal bleeding) 10/06/2016  . Chest pressure 01/05/2015  . Gastric erosion   . Encounter for screening colonoscopy  01/22/2014  . Constipation 01/20/2014  . GERD (gastroesophageal reflux disease) 01/20/2014  . Dyspepsia 01/20/2014  . SVT (supraventricular tachycardia) (HCC) 05/31/2013  . HTN (hypertension), benign 05/31/2013  . Hypercholesteremia 05/31/2013  . COPD (chronic obstructive pulmonary disease) (HCC) 05/31/2013  . Cervical stenosis of spine 05/31/2013  . Peripheral neuropathy 05/31/2013   Lurena NidaAmy B Frazier, PTA/CLT [phone]  Lurena NidaFrazier, Amy B 06/21/2017, 4:22 PM  [facility] [address] [place], [place], [address] Phone: [phone]   Fax:  [phone]  Name: Gina Bradley MRN: [id number] Date of Birth: Oct 25, 1960

## 2017-06-22 ENCOUNTER — Telehealth (HOSPITAL_COMMUNITY): Payer: Self-pay | Admitting: Internal Medicine

## 2017-06-22 NOTE — Telephone Encounter (Signed)
06/22/17  LEFT A MESSAGE ASKING IF PATIENT WOULD COME IN April 16 AT 9AM INSTEAD OF April 15 SINCE THERAPIST WILL BE ON JURY DUTY

## 2017-06-23 ENCOUNTER — Ambulatory Visit (HOSPITAL_COMMUNITY): Payer: Medicare Other | Admitting: Physical Therapy

## 2017-06-23 ENCOUNTER — Encounter (HOSPITAL_COMMUNITY): Payer: Self-pay | Admitting: Physical Therapy

## 2017-06-23 DIAGNOSIS — S91001D Unspecified open wound, right ankle, subsequent encounter: Secondary | ICD-10-CM | POA: Diagnosis not present

## 2017-06-23 DIAGNOSIS — S81001D Unspecified open wound, right knee, subsequent encounter: Secondary | ICD-10-CM | POA: Diagnosis not present

## 2017-06-23 DIAGNOSIS — R262 Difficulty in walking, not elsewhere classified: Secondary | ICD-10-CM

## 2017-06-23 DIAGNOSIS — S81802S Unspecified open wound, left lower leg, sequela: Secondary | ICD-10-CM

## 2017-06-23 DIAGNOSIS — S81802D Unspecified open wound, left lower leg, subsequent encounter: Secondary | ICD-10-CM | POA: Diagnosis not present

## 2017-06-23 DIAGNOSIS — S81801D Unspecified open wound, right lower leg, subsequent encounter: Secondary | ICD-10-CM

## 2017-06-23 DIAGNOSIS — I890 Lymphedema, not elsewhere classified: Secondary | ICD-10-CM | POA: Diagnosis not present

## 2017-06-23 NOTE — Therapy (Signed)
[facility] [address] [place], [place], [address] Phone: [phone]   Fax:  [phone]  Physical Therapy Treatment  Patient Details  Name: Gina Bradley MRN: [id number] Date of Birth: May 15, 1960 Referring Provider: Lewayne Buntingaylor Gregg   Encounter Date: 06/23/2017  PT End of Session - 06/23/17 1440    Visit Number  3    Number of Visits  18    Date for PT Re-Evaluation  07/31/17 Reassess weekly on Wed.     Authorization - Visit Number  3    Authorization - Number of Visits  18    PT Start Time  1325    PT Stop Time  1430    PT Time Calculation (min)  65 min    Activity Tolerance  Patient tolerated treatment well    Behavior During Therapy  WFL for tasks assessed/performed       Past Medical History:  Diagnosis Date  . Anemia   . Cervical disc disease   . COPD (chronic obstructive pulmonary disease) (HCC)   . Diabetes mellitus without complication (HCC)   . Essential hypertension   . GERD (gastroesophageal reflux disease)   . Heart failure (HCC) 2018  . History of cardiac catheterization    Normal coronaries March 2015  . History of non-ST elevation myocardial infarction (NSTEMI)    Secondary to SVT  . Peripheral neuropathy   . Spinal stenosis   . SVT (supraventricular tachycardia) (HCC)     Past Surgical History:  Procedure Laterality Date  . CESAREAN SECTION    . ESOPHAGOGASTRODUODENOSCOPY N/A 02/10/2014   GUY:QIHKVQRMR:Single tiny antral erosion; otherwise normal EGD. No explanation for patient's symptoms. Gallbladder needs further evaluation.  [name]. LEFT HEART CATHETERIZATION WITH CORONARY ANGIOGRAM N/A 06/03/2013   Procedure: LEFT HEART CATHETERIZATION WITH CORONARY ANGIOGRAM;  Surgeon: Lesleigh NoeHenry W Smith III, MD;  [facility];  Service: Cardiovascular;  Laterality: N/A;    There were no vitals filed for this visit.  Subjective Assessment - 06/23/17 1430    Subjective  Pt over 20 minutes late for appointment.  States that the  inside of her left ankle is bothering her     Currently in Pain?  Yes    Pain Score  3     Pain Location  Ankle    Pain Orientation  Left;Medial    Pain Descriptors / Indicators  Discomfort    Pain Type  Acute pain    Pain Onset  1 to 4 weeks ago    Aggravating Factors   nothing     Pain Relieving Factors  nothing                 No data recorded       [facility] Adult PT Treatment/Exercise - 06/23/17 0001      Manual Therapy   Manual Therapy  Manual Lymphatic Drainage (MLD);Compression Bandaging    Manual therapy comments  done seperate from all other aspects of treatment.    Manual Lymphatic Drainage (MLD)  to include supraclavicular, deep and superfical abdominal; B inguinal/axillary anastomosis and thigh area .  Manual done anteriorly only due to time restraints.     Compression Bandaging  B profore with toe wraps and extra cotton.  Wound on Lt medial ankle and Rt posterior LE bladed followed by honey 2x2 prior to bandaging              PT Education - 06/23/17 1439    Education provided  Yes  Education Details  To perform pumping on lateral thighs from knee to hips     Person(s) Educated  Patient    Methods  Explanation;Demonstration    Comprehension  Verbalized understanding       PT Short Term Goals - 06/21/17 1508      PT SHORT TERM GOAL #1   Title  PT woundson B LE  to have stopped weeping to decrease risk of infection     Time  1    Period  Weeks    Status  On-going      PT SHORT TERM GOAL #2   Title  PT wounds on B LE  to be 100% granulated     Time  2    Period  Weeks    Status  On-going      PT SHORT TERM GOAL #3   Title  PT volume to be decreased by 1.5 cm to allow pt to lift legs onto the beds with greater ease.     Time  2    Period  Weeks    Status  On-going        PT Long Term Goals - 06/21/17 1508      PT LONG TERM GOAL #1   Title  Pt volume to be decreased by 3 cm to allow pt to don and doff clothes easier.     Time  6     Period  Weeks    Status  On-going      PT LONG TERM GOAL #2   Title  PT wounds to be healed to decrease risk of infection     Time  4    Period  Weeks    Status  On-going      PT LONG TERM GOAL #3   Title  Pt pain in LE to be no greater than a 3/10 to allow walking to be easier ( pt to be able to walk for five-10 minute without leg pain) . Pt is also limited due to her COPd    Status  On-going            Plan - 06/23/17 1440    Clinical Impression Statement  Pt late for appointment. PT comes in walking as opposed to wheelchair at initial evaluation.  States her legs feel much better. Therapist stressed the importance of coming to her appointments on time.  Reporting that all wounds except Rt posterior wound was healed was in error. Pt still has wound on LT medial ankle which has a red halo around it this halo was present at eval and it has not increased.  Wound are covered with white eschar.  Both Lt ankle and Rt posterior calf wounds were bladed prior to applying honey, 2x2 and medipore tape.  PT toes were wrapped this session due to increased edema.  Foam was cut for B LE but not thighs due to time restraints.  Cut foam is in cabinet in lymphedema room.       Rehab Potential  Good    PT Frequency  3x / week    PT Duration  6 weeks    PT Treatment/Interventions  Patient/family education;Manual techniques;Manual lymph drainage;Compression bandaging    PT Next Visit Plan  Begin manual techniques for B LE decongestion on posterior aspect as well as anterior. .  Once short stretch has been received, begin multilayer short stretch bandaging to thigh level B.  Next session cut foam for thighs .  Consulted and Agree with Plan of Care  Patient       Patient will benefit from skilled therapeutic intervention in order to improve the following deficits and impairments:  Cardiopulmonary status limiting activity, Decreased activity tolerance, Decreased strength, Difficulty walking, Pain,  Increased edema, Other (comment)(open wounds)  Visit Diagnosis: Lymphedema, not elsewhere classified  Difficulty in walking, not elsewhere classified  Open wound of right leg, and ankle with complication, subsequent encounter  Open wound of left lower leg with complication, sequela     Problem List Patient Active Problem List   Diagnosis Date Noted  . PMB (postmenopausal bleeding) 10/06/2016  . Chest pressure 01/05/2015  . Gastric erosion   . Encounter for screening colonoscopy 01/22/2014  . Constipation 01/20/2014  . GERD (gastroesophageal reflux disease) 01/20/2014  . Dyspepsia 01/20/2014  . SVT (supraventricular tachycardia) (HCC) 05/31/2013  . HTN (hypertension), benign 05/31/2013  . Hypercholesteremia 05/31/2013  . COPD (chronic obstructive pulmonary disease) (HCC) 05/31/2013  . Cervical stenosis of spine 05/31/2013  . Peripheral neuropathy 05/31/2013   Virgina Organ, PT CLT [phone] 06/23/2017, 2:45 PM  [facility] [address] [place], [place], [address] Phone: [phone]   Fax:  [phone]  Name: Gina Bradley MRN: [id number] Date of Birth: 1960-07-25

## 2017-06-26 ENCOUNTER — Ambulatory Visit (HOSPITAL_COMMUNITY): Payer: Medicare Other | Admitting: Physical Therapy

## 2017-06-26 DIAGNOSIS — S81001D Unspecified open wound, right knee, subsequent encounter: Secondary | ICD-10-CM | POA: Diagnosis not present

## 2017-06-26 DIAGNOSIS — R262 Difficulty in walking, not elsewhere classified: Secondary | ICD-10-CM | POA: Diagnosis not present

## 2017-06-26 DIAGNOSIS — S81801D Unspecified open wound, right lower leg, subsequent encounter: Secondary | ICD-10-CM | POA: Diagnosis not present

## 2017-06-26 DIAGNOSIS — S81802S Unspecified open wound, left lower leg, sequela: Secondary | ICD-10-CM

## 2017-06-26 DIAGNOSIS — I890 Lymphedema, not elsewhere classified: Secondary | ICD-10-CM

## 2017-06-26 DIAGNOSIS — S91001D Unspecified open wound, right ankle, subsequent encounter: Secondary | ICD-10-CM

## 2017-06-26 DIAGNOSIS — S81802D Unspecified open wound, left lower leg, subsequent encounter: Secondary | ICD-10-CM | POA: Diagnosis not present

## 2017-06-26 NOTE — Therapy (Signed)
[facility] [address] [place], [place], [address] Phone: [phone]   Fax:  [phone]  Physical Therapy Treatment  Patient Details  Name: Gina Bradley MRN: [id number] Date of Birth: May 23, 1960 Referring Provider: Lewayne Bunting   Encounter Date: 06/26/2017  PT End of Session - 06/26/17 1550    Visit Number  4    Number of Visits  18    Date for PT Re-Evaluation  07/31/17 Reassess weekly on Wed.     Authorization - Visit Number  4    Authorization - Number of Visits  18    PT Start Time  1352    PT Stop Time  1505    PT Time Calculation (min)  73 min    Activity Tolerance  Patient tolerated treatment well    Behavior During Therapy  WFL for tasks assessed/performed       Past Medical History:  Diagnosis Date  . Anemia   . Cervical disc disease   . COPD (chronic obstructive pulmonary disease) (HCC)   . Diabetes mellitus without complication (HCC)   . Essential hypertension   . GERD (gastroesophageal reflux disease)   . Heart failure (HCC) 2018  . History of cardiac catheterization    Normal coronaries March 2015  . History of non-ST elevation myocardial infarction (NSTEMI)    Secondary to SVT  . Peripheral neuropathy   . Spinal stenosis   . SVT (supraventricular tachycardia) (HCC)     Past Surgical History:  Procedure Laterality Date  . CESAREAN SECTION    . ESOPHAGOGASTRODUODENOSCOPY N/A 02/10/2014   ONG:EXBMWU tiny antral erosion; otherwise normal EGD. No explanation for patient's symptoms. Gallbladder needs further evaluation.  [name] LEFT HEART CATHETERIZATION WITH CORONARY ANGIOGRAM N/A 06/03/2013   Procedure: LEFT HEART CATHETERIZATION WITH CORONARY ANGIOGRAM;  Surgeon: Lesleigh Noe, MD;  Location: [facility] CATH LAB;  Service: Cardiovascular;  Laterality: N/A;    There were no vitals filed for this visit.  Subjective Assessment - 06/26/17 1542    Subjective  Pt states her legs feel so much better.  Reports she has  some itching but no pain.    Currently in Pain?  No/denies                No data recorded       [facility] Adult PT Treatment/Exercise - 06/26/17 0001      Manual Therapy   Manual Therapy  Manual Lymphatic Drainage (MLD);Compression Bandaging    Manual therapy comments  done seperate from all other aspects of treatment.    Manual Lymphatic Drainage (MLD)  to include supraclavicular, deep and superfical abdominal; B inguinal/axillary anastomosis and thigh area .  Manual done anteriorly only due to time restraints.     Compression Bandaging  B profore with toe wraps and extra cotton.  Wound on Lt medial ankle and Rt posterior LE bladed followed by honey 2x2 prior to bandaging     Other Manual Therapy  Rt Posterior LE wound:  0.5cm@ 50% slough, 50% granulated, minimal serosanginous drainage no odor.  Dressed with medihoney gel and gauze . Lt medial ankle wound also debrided and  dressed, not measured               PT Short Term Goals - 06/21/17 1508      PT SHORT TERM GOAL #1   Title  PT woundson B LE  to have stopped weeping to decrease risk of infection     Time  1    Period  Weeks    Status  On-going      PT SHORT TERM GOAL #2   Title  PT wounds on B LE  to be 100% granulated     Time  2    Period  Weeks    Status  On-going      PT SHORT TERM GOAL #3   Title  PT volume to be decreased by 1.5 cm to allow pt to lift legs onto the beds with greater ease.     Time  2    Period  Weeks    Status  On-going        PT Long Term Goals - 06/21/17 1508      PT LONG TERM GOAL #1   Title  Pt volume to be decreased by 3 cm to allow pt to don and doff clothes easier.     Time  6    Period  Weeks    Status  On-going      PT LONG TERM GOAL #2   Title  PT wounds to be healed to decrease risk of infection     Time  4    Period  Weeks    Status  On-going      PT LONG TERM GOAL #3   Title  Pt pain in LE to be no greater than a 3/10 to allow walking to be easier ( pt  to be able to walk for five-10 minute without leg pain) . Pt is also limited due to her COPd    Status  On-going            Plan - 06/26/17 1630    Clinical Impression Statement  Pt presents today overall pleased with progress so far.  Pt arrived ontime for appt as well.  Wounds continue to improve on bilateral LE's and general LE skin integrity as well.  LE's cleansed and moisturized.  Wounds were debrided, cleansed and redressed with medihoney gel and gauze.  Manual lymph drainage was completed for bilateral LE's.  continued with toe bandages and profore for compression bilaterally.      Rehab Potential  Good    PT Frequency  3x / week    PT Duration  6 weeks    PT Treatment/Interventions  Patient/family education;Manual techniques;Manual lymph drainage;Compression bandaging    PT Next Visit Plan  Once short stretch has been received, begin multilayer short stretch bandaging progressing to thigh level bilaterally.      Consulted and Agree with Plan of Care  Patient       Patient will benefit from skilled therapeutic intervention in order to improve the following deficits and impairments:  Cardiopulmonary status limiting activity, Decreased activity tolerance, Decreased strength, Difficulty walking, Pain, Increased edema, Other (comment)(open wounds)  Visit Diagnosis: Lymphedema, not elsewhere classified  Difficulty in walking, not elsewhere classified  Open wound of right leg, and ankle with complication, subsequent encounter  Open wound of left lower leg with complication, sequela     Problem List Patient Active Problem List   Diagnosis Date Noted  . PMB (postmenopausal bleeding) 10/06/2016  . Chest pressure 01/05/2015  . Gastric erosion   . Encounter for screening colonoscopy 01/22/2014  . Constipation 01/20/2014  . GERD (gastroesophageal reflux disease) 01/20/2014  . Dyspepsia 01/20/2014  . SVT (supraventricular tachycardia) (HCC) 05/31/2013  . HTN (hypertension),  benign 05/31/2013  . Hypercholesteremia 05/31/2013  . COPD (chronic obstructive pulmonary disease) (HCC) 05/31/2013  .  Cervical stenosis of spine 05/31/2013  . Peripheral neuropathy 05/31/2013   Lurena NidaAmy B Chee Dimon, PTA/CLT [phone]  Lurena NidaFrazier, Mistie Adney B 06/26/2017, 4:34 PM   [facility] [address] [place], [place], [address] Phone: [phone]   Fax:  [phone]  Name: Gina Bradley MRN: [id number] Date of Birth: 09/07/1960

## 2017-06-28 ENCOUNTER — Ambulatory Visit (HOSPITAL_COMMUNITY): Payer: Medicare Other | Admitting: Physical Therapy

## 2017-06-28 ENCOUNTER — Encounter (HOSPITAL_COMMUNITY): Payer: Self-pay | Admitting: Physical Therapy

## 2017-06-28 DIAGNOSIS — R262 Difficulty in walking, not elsewhere classified: Secondary | ICD-10-CM | POA: Diagnosis not present

## 2017-06-28 DIAGNOSIS — S81801D Unspecified open wound, right lower leg, subsequent encounter: Secondary | ICD-10-CM

## 2017-06-28 DIAGNOSIS — S81802S Unspecified open wound, left lower leg, sequela: Secondary | ICD-10-CM

## 2017-06-28 DIAGNOSIS — S81001D Unspecified open wound, right knee, subsequent encounter: Secondary | ICD-10-CM | POA: Diagnosis not present

## 2017-06-28 DIAGNOSIS — I890 Lymphedema, not elsewhere classified: Secondary | ICD-10-CM

## 2017-06-28 DIAGNOSIS — S81802D Unspecified open wound, left lower leg, subsequent encounter: Secondary | ICD-10-CM | POA: Diagnosis not present

## 2017-06-28 DIAGNOSIS — S91001D Unspecified open wound, right ankle, subsequent encounter: Secondary | ICD-10-CM

## 2017-06-28 NOTE — Therapy (Signed)
[place], [place], [address] Phone: [phone]   Fax:  [phone]  Physical Therapy Treatment  Patient Details  Name: Gina Bradley MRN: [id number] Date of Birth: 03-Apr-1961 Referring Provider: Cristopher Peru   Encounter Date: 06/28/2017  PT End of Session - 06/28/17 1557    Visit Number  5    Number of Visits  18    Date for PT Re-Evaluation  07/31/17 Reassess weekly on Wed.     Authorization - Visit Number  5    Authorization - Number of Visits  18    PT Start Time  1440 began [phone] with Azucena Freed PT    PT Stop Time  1532    PT Time Calculation (min)  52 min    Activity Tolerance  Patient tolerated treatment well    Behavior During Therapy  [facility] for tasks assessed/performed       Past Medical History:  Diagnosis Date  . Anemia   . Cervical disc disease   . COPD (chronic obstructive pulmonary disease) ([place])   . Diabetes mellitus without complication ([place])   . Essential hypertension   . GERD (gastroesophageal reflux disease)   . Heart failure ([place]) 2018  . History of cardiac catheterization    Normal coronaries March 2015  . History of non-ST elevation myocardial infarction (NSTEMI)    Secondary to SVT  . Peripheral neuropathy   . Spinal stenosis   . SVT (supraventricular tachycardia) ([facility])     Past Surgical History:  Procedure Laterality Date  . CESAREAN SECTION    . ESOPHAGOGASTRODUODENOSCOPY N/A 02/10/2014   VQM:[id number] tiny antral erosion; otherwise normal EGD. No explanation for patient's symptoms. Gallbladder needs further evaluation.  [name] LEFT HEART CATHETERIZATION WITH CORONARY ANGIOGRAM N/A 06/03/2013   Procedure: LEFT HEART CATHETERIZATION WITH CORONARY ANGIOGRAM;  Surgeon: Sinclair Grooms, MD;  Location: [facility] CATH LAB;  Service: Cardiovascular;  Laterality: N/A;    There were no vitals filed for this visit.  Subjective Assessment - 06/28/17 1554    Subjective  Pt arrived 20  minutes late for appt.  States she is feeling good today.  Legs are feeling much better and she is so pleased.             LYMPHEDEMA/ONCOLOGY QUESTIONNAIRE - 06/28/17 1338      Right Lower Extremity Lymphedema   20 cm Proximal to Suprapatella  71 cm inital 71.8    10 cm Proximal to Suprapatella  58 cm 59.5    At Midpatella/Popliteal Crease  42.7 cm 45.3    30 cm Proximal to Floor at Lateral Plantar Foot  39 cm 41.2    20 cm Proximal to Floor at Lateral Plantar Foot  31.3 1 34    10 cm Proximal to Floor at Lateral Malleoli  22.6 cm 30.3    Circumference of ankle/heel  32 cm. 35.3    5 cm Proximal to 1st MTP Joint  23.5 cm 28.2    Across MTP Joint  22.8 cm 25.7    Around Proximal Great Toe  8.7 cm 9    Other  wound on posterior aspect of right leg. 1.6x.8x.2 30% granulated     Other  wounds bladed followed by honey and 2x2       Left Lower Extremity Lymphedema   20 cm Proximal to Suprapatella  68.5 cm was72    10 cm Proximal to Suprapatella  58 cm was59.5    At  Midpatella/Popliteal Crease  45 cm was 43.8    30 cm Proximal to Floor at Lateral Plantar Foot  39.4 cm was 41    20 cm Proximal to Floor at Lateral Plantar Foot  31.8 cm was34.3    10 cm Proximal to Floor at Lateral Malleoli  23 cm was28.8    Circumference of ankle/heel  30.8 cm. was 34.5    5 cm Proximal to 1st MTP Joint  23.3 cm was 27.3    Across MTP Joint  22.6 cm was 24.8    Around Proximal Great Toe  8.5 cm    Other  wound .2x.4x.2 30% granulated  wound tunnels slightly all over     Other  wound bladed followed by honey and 2x2          No data recorded       [facility] Adult PT Treatment/Exercise - 06/28/17 0001      Manual Therapy   Manual Therapy  Manual Lymphatic Drainage (MLD);Compression Bandaging    Manual therapy comments  done seperate from all other aspects of treatment.    Manual Lymphatic Drainage (MLD)  to include supraclavicular, deep and superfical abdominal; B inguinal/axillary anastomosis  and thigh area .  Manual done anteriorly only due to time restraints.     Compression Bandaging  B profore with toe wraps and extra cotton.  Wound on Lt medial ankle and Rt posterior LE bladed followed by honey 2x2 prior to bandaging     Other Manual Therapy  woundcare for B LE.  Updated measurements in lymphdema measurement section.               PT Short Term Goals - 06/21/17 1508      PT SHORT TERM GOAL #1   Title  PT woundson B LE  to have stopped weeping to decrease risk of infection     Time  1    Period  Weeks    Status  On-going      PT SHORT TERM GOAL #2   Title  PT wounds on B LE  to be 100% granulated     Time  2    Period  Weeks    Status  On-going      PT SHORT TERM GOAL #3   Title  PT volume to be decreased by 1.5 cm to allow pt to lift legs onto the beds with greater ease.     Time  2    Period  Weeks    Status  On-going        PT Long Term Goals - 06/21/17 1508      PT LONG TERM GOAL #1   Title  Pt volume to be decreased by 3 cm to allow pt to don and doff clothes easier.     Time  6    Period  Weeks    Status  On-going      PT LONG TERM GOAL #2   Title  PT wounds to be healed to decrease risk of infection     Time  4    Period  Weeks    Status  On-going      PT LONG TERM GOAL #3   Title  Pt pain in LE to be no greater than a 3/10 to allow walking to be easier ( pt to be able to walk for five-10 minute without leg pain) . Pt is also limited due to her COPd    Status  On-going            Plan - 06/28/17 1558    Clinical Impression Statement  Pt showed late for her appt and adjustments were made with scheduling to get treatment in for today.  Treatment began with Azucena Freed, CLT (13:25-14:10) with LE circumferential and wound measurements taken, wounds debrided and redressed with medihoney gel and gauze.  Treatment was then resumed and completed (14:4o-15:32) including manula lymph drainage, application of lotion and redressing wtih  profore compression system.  Measuremnts revealed overall progress with decrease in volume of LE's and also reduction in size of wounds.  Pt instructed to bring her compression garments from home next session to see if she is ready to transition to these.     Rehab Potential  Good    PT Frequency  3x / week    PT Duration  6 weeks    PT Treatment/Interventions  Patient/family education;Manual techniques;Manual lymph drainage;Compression bandaging    PT Next Visit Plan  Per evaluating therapist, pt to bring in her personal compression thigh highs next session to check if now fit better with lower volumes.  continue current POC including wounds until healed and goals met.      Consulted and Agree with Plan of Care  Patient       Patient will benefit from skilled therapeutic intervention in order to improve the following deficits and impairments:  Cardiopulmonary status limiting activity, Decreased activity tolerance, Decreased strength, Difficulty walking, Pain, Increased edema, Other (comment)(open wounds)  Visit Diagnosis: Lymphedema, not elsewhere classified  Difficulty in walking, not elsewhere classified  Open wound of left lower leg with complication, sequela  Open wound of right leg, and ankle with complication, subsequent encounter     Problem List Patient Active Problem List   Diagnosis Date Noted  . PMB (postmenopausal bleeding) 10/06/2016  . Chest pressure 01/05/2015  . Gastric erosion   . Encounter for screening colonoscopy 01/22/2014  . Constipation 01/20/2014  . GERD (gastroesophageal reflux disease) 01/20/2014  . Dyspepsia 01/20/2014  . SVT (supraventricular tachycardia) ([place]) 05/31/2013  . HTN (hypertension), benign 05/31/2013  . Hypercholesteremia 05/31/2013  . COPD (chronic obstructive pulmonary disease) ([place]) 05/31/2013  . Cervical stenosis of spine 05/31/2013  . Peripheral neuropathy 05/31/2013   Teena Irani, PTA/CLT [phone]  Teena Irani 06/28/2017, 4:05 PM  [place], [place], [address] Phone: [phone]   Fax:  [phone]  Name: Gina Bradley MRN: [id number] Date of Birth: 04/13/1960

## 2017-06-30 ENCOUNTER — Encounter (HOSPITAL_COMMUNITY): Payer: Medicare Other | Admitting: Physical Therapy

## 2017-06-30 ENCOUNTER — Telehealth (HOSPITAL_COMMUNITY): Payer: Self-pay | Admitting: Physical Therapy

## 2017-06-30 NOTE — Telephone Encounter (Signed)
Pt called@ 2:15pm  said she was having chest pain and took meds could not come in today. NF 06/30/17

## 2017-07-03 ENCOUNTER — Ambulatory Visit (HOSPITAL_COMMUNITY): Payer: Medicare Other | Attending: Internal Medicine | Admitting: Physical Therapy

## 2017-07-03 DIAGNOSIS — S81001D Unspecified open wound, right knee, subsequent encounter: Secondary | ICD-10-CM | POA: Insufficient documentation

## 2017-07-03 DIAGNOSIS — S81802S Unspecified open wound, left lower leg, sequela: Secondary | ICD-10-CM | POA: Insufficient documentation

## 2017-07-03 DIAGNOSIS — I890 Lymphedema, not elsewhere classified: Secondary | ICD-10-CM | POA: Insufficient documentation

## 2017-07-03 DIAGNOSIS — R262 Difficulty in walking, not elsewhere classified: Secondary | ICD-10-CM | POA: Insufficient documentation

## 2017-07-03 DIAGNOSIS — S91001D Unspecified open wound, right ankle, subsequent encounter: Secondary | ICD-10-CM | POA: Insufficient documentation

## 2017-07-03 DIAGNOSIS — S81801D Unspecified open wound, right lower leg, subsequent encounter: Secondary | ICD-10-CM | POA: Insufficient documentation

## 2017-07-03 NOTE — Telephone Encounter (Signed)
Called pt about missed appointment.  Pt thought that her appointment was this afternoon. Therapist advised pt that her next appointment was on 07/05/2017 at 1:00 pm.  Gina Bradley, PT CLT [phone]

## 2017-07-05 ENCOUNTER — Ambulatory Visit (HOSPITAL_COMMUNITY): Payer: Medicare Other | Admitting: Physical Therapy

## 2017-07-05 DIAGNOSIS — S81001D Unspecified open wound, right knee, subsequent encounter: Secondary | ICD-10-CM

## 2017-07-05 DIAGNOSIS — S81801D Unspecified open wound, right lower leg, subsequent encounter: Secondary | ICD-10-CM | POA: Diagnosis not present

## 2017-07-05 DIAGNOSIS — I890 Lymphedema, not elsewhere classified: Secondary | ICD-10-CM | POA: Diagnosis not present

## 2017-07-05 DIAGNOSIS — S91001D Unspecified open wound, right ankle, subsequent encounter: Secondary | ICD-10-CM | POA: Diagnosis not present

## 2017-07-05 DIAGNOSIS — S81802S Unspecified open wound, left lower leg, sequela: Secondary | ICD-10-CM

## 2017-07-05 DIAGNOSIS — R262 Difficulty in walking, not elsewhere classified: Secondary | ICD-10-CM | POA: Diagnosis not present

## 2017-07-05 NOTE — Therapy (Addendum)
[place], [place], [address] Phone: [phone]   Fax:  [phone]  Physical Therapy Treatment  Patient Details  Name: Gina Bradley MRN: [id number] Date of Birth: 05/03/1960 Referring Provider: Cristopher Peru   Encounter Date: 07/05/2017  PT End of Session - 07/05/17 1552    Visit Number  6    Number of Visits  18    Date for PT Re-Evaluation  07/31/17 Reassess weekly on Wed.     Authorization Type  cert period 1/69/45-0/38/88    Authorization - Visit Number  6    Authorization - Number of Visits  18    PT Start Time  1320 Pt was late for appt.    PT Stop Time  1430    PT Time Calculation (min)  70 min    Activity Tolerance  Patient tolerated treatment well    Behavior During Therapy  WFL for tasks assessed/performed       Past Medical History:  Diagnosis Date  . Anemia   . Cervical disc disease   . COPD (chronic obstructive pulmonary disease) ([place])   . Diabetes mellitus without complication ([place])   . Essential hypertension   . GERD (gastroesophageal reflux disease)   . Heart failure ([place]) 2018  . History of cardiac catheterization    Normal coronaries March 2015  . History of non-ST elevation myocardial infarction (NSTEMI)    Secondary to SVT  . Peripheral neuropathy   . Spinal stenosis   . SVT (supraventricular tachycardia) ([facility])     Past Surgical History:  Procedure Laterality Date  . CESAREAN SECTION    . ESOPHAGOGASTRODUODENOSCOPY N/A 02/10/2014   KCM:[id number] tiny antral erosion; otherwise normal EGD. No explanation for patient's symptoms. Gallbladder needs further evaluation.  [name] LEFT HEART CATHETERIZATION WITH CORONARY ANGIOGRAM N/A 06/03/2013   Procedure: LEFT HEART CATHETERIZATION WITH CORONARY ANGIOGRAM;  Surgeon: Sinclair Grooms, MD;  Location: [facility] CATH LAB;  Service: Cardiovascular;  Laterality: N/A;    There were no vitals filed for this visit.  Subjective Assessment - 07/05/17 1546     Subjective  Pt states she's been having alot of issues going on at home and has been difficult to get here.  Pt returns today wtih profores still in place from last week.  Pt forgot her compression hose.     Currently in Pain?  No/denies            LYMPHEDEMA/ONCOLOGY QUESTIONNAIRE - 07/05/17 1547      Right Lower Extremity Lymphedema   20 cm Proximal to Suprapatella  68 cm    10 cm Proximal to Suprapatella  56.2 cm    At Midpatella/Popliteal Crease  45.7 cm    30 cm Proximal to Floor at Lateral Plantar Foot  39.4 cm    20 cm Proximal to Floor at Lateral Plantar Foot  31._0 cm Proximal to Floor at Lateral Malleoli  22.3 cm    Circumference of ankle/heel  30.2 cm.    5 cm Proximal to 1st MTP Joint  23.7 cm    Across MTP Joint  22.6 cm    Around Proximal Great Toe  8 cm    Other  wound on posterior aspect of right leg. 1X0.7X0.1 cm 70% granulated (06/28/17 was 1.6x.8x.2 30% granulated)     Other  debrided borders to promote approximation and removed slough from central wound.  Dressed with medihoney gel and gauze  Left Lower Extremity Lymphedema   20 cm Proximal to Suprapatella  66.5 cm    10 cm Proximal to Suprapatella  58 cm    At Midpatella/Popliteal Crease  45.5 cm    30 cm Proximal to Floor at Lateral Plantar Foot  40 cm    20 cm Proximal to Floor at Lateral Plantar Foot  32 cm    10 cm Proximal to Floor at Lateral Malleoli  22.5 cm    Circumference of ankle/heel  30 cm.    5 cm Proximal to 1st MTP Joint  23 cm    Across MTP Joint  22 cm    Around Proximal Great Toe  8.5 cm    Other  wound on medial ankle 0.2X0.2X 0.2 cm and 85% granulated  (was 06/28/17:  0.2x0.4x0.2 30% granulated)     Other  debrided borders to promote approximation and removed slough from central wound.  Dressed with medihoney gel and gauze                [facility] Adult PT Treatment/Exercise - 07/05/17 0001      Manual Therapy   Manual Therapy  Manual Lymphatic Drainage  (MLD);Compression Bandaging    Manual therapy comments  done seperate from all other aspects of treatment.    Manual Lymphatic Drainage (MLD)  to include supraclavicular, deep and superfical abdominal; B inguinal/axillary anastomosis and thigh area .  Manual done anteriorly only due to time restraints.     Compression Bandaging  B profore with toe wraps and extra cotton.  Wound on Lt medial ankle and Rt posterior LE bladed followed by honey 2x2 prior to bandaging     Other Manual Therapy  woundcare for B LE.  Updated measurements in lymphdema measurement section.             PT Education - 07/05/17 1554    Education provided  Yes    Education Details  instructed to bring compression garments next session.    Person(s) Educated  Patient    Methods  Explanation    Comprehension  Verbalized understanding       PT Short Term Goals - 06/21/17 1508      PT SHORT TERM GOAL #1   Title  PT woundson B LE  to have stopped weeping to decrease risk of infection     Time  1    Period  Weeks    Status  On-going      PT SHORT TERM GOAL #2   Title  PT wounds on B LE  to be 100% granulated     Time  2    Period  Weeks    Status  On-going      PT SHORT TERM GOAL #3   Title  PT volume to be decreased by 1.5 cm to allow pt to lift legs onto the beds with greater ease.     Time  2    Period  Weeks    Status  On-going        PT Long Term Goals - 06/21/17 1508      PT LONG TERM GOAL #1   Title  Pt volume to be decreased by 3 cm to allow pt to don and doff clothes easier.     Time  6    Period  Weeks    Status  On-going      PT LONG TERM GOAL #2   Title  PT wounds to be healed to  decrease risk of infection     Time  4    Period  Weeks    Status  On-going      PT LONG TERM GOAL #3   Title  Pt pain in LE to be no greater than a 3/10 to allow walking to be easier ( pt to be able to walk for five-10 minute without leg pain) . Pt is also limited due to her COPd    Status  On-going             Plan - 07/05/17 1555    Clinical Impression Statement  PT was 20 minutes late for appt.  Has cancelled last 2 appt due to family issues and has been 1 week since last appt.  Tops of profores had slid down revealing increased edema in this area.  Bil LE's remeasured with overall reduction except midpatellar and measurement distally bilaterally.  Wounds also remeasured with overall approximation and increased granulation.  Instructed to bring compression garments next session as may be able to transition to these, especially on the Lt LE.  Completed manual, moisturized and redressed with profore to knee level.  Pt reported overall comfort with dressings.      Rehab Potential  Good    PT Frequency  3x / week    PT Duration  6 weeks    PT Treatment/Interventions  Patient/family education;Manual techniques;Manual lymph drainage;Compression bandaging    PT Next Visit Plan  Per evaluating therapist, pt to bring in her personal compression thigh highs next session to check if now fit better with lower volumes.  continue current POC including wounds until healed and goals met.      Consulted and Agree with Plan of Care  Patient       Patient will benefit from skilled therapeutic intervention in order to improve the following deficits and impairments:  Cardiopulmonary status limiting activity, Decreased activity tolerance, Decreased strength, Difficulty walking, Pain, Increased edema, Other (comment)(open wounds)  Visit Diagnosis: Lymphedema, not elsewhere classified  Difficulty in walking, not elsewhere classified  Open wound of left lower leg with complication, sequela  Open wound of right leg, and ankle with complication, subsequent encounter     Problem List Patient Active Problem List   Diagnosis Date Noted  . PMB (postmenopausal bleeding) 10/06/2016  . Chest pressure 01/05/2015  . Gastric erosion   . Encounter for screening colonoscopy 01/22/2014  . Constipation 01/20/2014   . GERD (gastroesophageal reflux disease) 01/20/2014  . Dyspepsia 01/20/2014  . SVT (supraventricular tachycardia) ([place]) 05/31/2013  . HTN (hypertension), benign 05/31/2013  . Hypercholesteremia 05/31/2013  . COPD (chronic obstructive pulmonary disease) ([place]) 05/31/2013  . Cervical stenosis of spine 05/31/2013  . Peripheral neuropathy 05/31/2013   Teena Irani, PTA/CLT [phone]  Teena Irani 07/05/2017, 4:23 PM  [place], [place], [address] Phone: [phone]   Fax:  [phone]  Name: HOORAIN KOZAKIEWICZ MRN: [id number] Date of Birth: 09-21-1960

## 2017-07-07 ENCOUNTER — Telehealth (HOSPITAL_COMMUNITY): Payer: Self-pay | Admitting: Physical Therapy

## 2017-07-07 ENCOUNTER — Ambulatory Visit (HOSPITAL_COMMUNITY): Payer: Medicare Other | Admitting: Physical Therapy

## 2017-07-07 DIAGNOSIS — G4733 Obstructive sleep apnea (adult) (pediatric): Secondary | ICD-10-CM | POA: Diagnosis not present

## 2017-07-07 NOTE — Telephone Encounter (Signed)
PT did not show for appointment.  Called pt and left  A message.  Explained that I realized she was having other issues but she has only been to treatment 4/9 visits which is not optimal.  If pt cancels or no shows again she will be discharged due to the fact that there is a waiting list  For this treatment and it is taking two treatment spots.  Pt was reminded of her treatment on Monday.  Virgina Organynthia Russell, PT CLT [phone]

## 2017-07-07 NOTE — Progress Notes (Signed)
Cardiology Office Note  Date: 07/10/2017   ID: Gina Bradley, DOB [birth year], MRN [id number]  PCP: Elfredia NevinsFusco, Lawrence, MD  Primary Cardiologist: Nona DellSamuel Hyun Marsalis, MD   Chief Complaint  Patient presents with  . PSVT    History of Present Illness: Gina Bradley is a 57 y.o. female was seen by Ms. Liborio NixonLawrence DNP in September 2018.  She also had a more recent follow-up visit with Dr. Ladona Ridgelaylor in February of this year in review of known PSVT.  She presents for a routine visit, reports occasional palpitations that are generally brief, not associated with syncope or chest pain.  She remains on Toprol-XL 100 mg daily.  He is following with physical therapy for management of lymphedema.  She states that this has gotten significantly better, her legs and feet are less than twice the size that they used to be and she can now wear her bedroom slippers.  I reviewed her current medications which are outlined below.  I also went over her recent ECG.  Past Medical History:  Diagnosis Date  . Anemia   . Cervical disc disease   . COPD (chronic obstructive pulmonary disease) (HCC)   . Diabetes mellitus without complication (HCC)   . Essential hypertension   . GERD (gastroesophageal reflux disease)   . Heart failure (HCC) 2018  . History of cardiac catheterization    Normal coronaries March 2015  . History of non-ST elevation myocardial infarction (NSTEMI)    Secondary to SVT  . Peripheral neuropathy   . Spinal stenosis   . SVT (supraventricular tachycardia) (HCC)     Past Surgical History:  Procedure Laterality Date  . CESAREAN SECTION    . ESOPHAGOGASTRODUODENOSCOPY N/A 02/10/2014   WUJ:[id number]:Single tiny antral erosion; otherwise normal EGD. No explanation for patient's symptoms. Gallbladder needs further evaluation.  [name]. LEFT HEART CATHETERIZATION WITH CORONARY ANGIOGRAM N/A 06/03/2013   Procedure: LEFT HEART CATHETERIZATION WITH CORONARY ANGIOGRAM;  Surgeon: Lesleigh NoeHenry W Smith III, MD;  [facility];  Service: Cardiovascular;  Laterality: N/A;    Current Outpatient Medications  Medication Sig Dispense Refill  . albuterol (PROVENTIL HFA;VENTOLIN HFA) 108 (90 Base) MCG/ACT inhaler Inhale into the lungs every 6 (six) hours as needed for wheezing or shortness of breath.    [name]. albuterol (PROVENTIL) (2.5 MG/3ML) 0.083% nebulizer solution Take 2.5 mg by nebulization 4 (four) times daily.    [name]. ALPRAZolam (XANAX) 1 MG tablet Take 1 mg by mouth 3 (three) times daily as needed for anxiety or sleep. Anxiety    . ANORO ELLIPTA 62.5-25 MCG/INH AEPB Inhale 1 puff into the lungs daily.   11  . aspirin EC 81 MG EC tablet Take 1 tablet (81 mg total) by mouth daily.    [name]. atorvastatin (LIPITOR) 20 MG tablet Take 20 mg by mouth every evening.     [name]. co-enzyme Q-10 30 MG capsule Take 100 mg by mouth daily.    . Compression Bandages MISC 1 each by Does not apply route as directed. 1 each 0  . cyclobenzaprine (FLEXERIL) 10 MG tablet Take 10 mg by mouth at bedtime as needed.   3  . EPINEPHrine (EPIPEN 2-PAK) 0.3 mg/0.3 mL IJ SOAJ injection Inject 0.3 mLs (0.3 mg total) into the muscle once as needed (for severe allergic reaction). CAll 911 immediately if you have to use this medicine 2 Device 1  . furosemide (LASIX) 20 MG tablet TAKE (1) TABLET BY MOUTH 2 TIMES DAILY AS NEEDED. 60 tablet 0  .  gabapentin (NEURONTIN) 100 MG capsule Take 100 mg by mouth 3 (three) times daily.    [name] HYDROcodone-acetaminophen (NORCO) 10-325 MG per tablet Take 1 tablet by mouth every 6 (six) hours as needed. pain    . Investigational vitamin D 600 UNITS capsule SWOG S0812 Take 600 Units by mouth daily. Take with food.    . metFORMIN (GLUCOPHAGE) 500 MG tablet Take 500 mg by mouth as needed.  10  . metoprolol succinate (TOPROL-XL) 100 MG 24 hr tablet Take 1 tablet (100 mg total) by mouth daily. 30 tablet 11  . nitroGLYCERIN (NITROSTAT) 0.4 MG SL tablet Place 1 tablet (0.4 mg total) under the tongue every 5 (five) minutes as needed for  chest pain. 10 tablet 0  . Oxycodone HCl 10 MG TABS Take 5-10 mg by mouth daily as needed (for pain).   0  . pantoprazole (PROTONIX) 40 MG tablet Take 40 mg by mouth 2 (two) times daily.    . polyethylene glycol-electrolytes (TRILYTE) 420 g solution Take 4,000 mLs by mouth as directed. 4000 mL 0  . promethazine (PHENERGAN) 25 MG tablet Take 25 mg by mouth every 6 (six) hours as needed for nausea or vomiting.    . TOVIAZ 4 MG TB24 tablet Take 4 mg by mouth daily.   10  . triamcinolone cream (KENALOG) 0.1 % Apply 1 application topically as needed (ITCHING/IRRITATION).   2  . VIT B6-VIT B12-OMEGA 3 ACIDS PO Take 500 mg by mouth daily.     No current facility-administered medications for this visit.    Allergies:  Bee venom and Tdap [tetanus-diphth-acell pertussis]   Social History: The patient  reports that she has been smoking cigarettes.  She has a 9.00 pack-year smoking history. She has never used smokeless tobacco. She reports that she does not drink alcohol or use drugs.   ROS:  Please see the history of present illness. Otherwise, complete review of systems is positive for chronic lymphedema.  All other systems are reviewed and negative.   Physical Exam: VS:  BP 108/78   Pulse 99   Ht 5\' 1"  (1.549 m)   Wt 215 lb (97.5 kg)   LMP 01/05/2015   SpO2 97%   BMI 40.62 kg/m , BMI Body mass index is 40.62 kg/m.  Wt Readings from Last 3 Encounters:  07/10/17 215 lb (97.5 kg)  05/31/17 216 lb 12.8 oz (98.3 kg)  01/27/17 200 lb (90.7 kg)    General: Patient appears comfortable at rest. HEENT: Conjunctiva and lids normal, oropharynx clear. Neck: Supple, no elevated JVP or carotid bruits, no thyromegaly. Lungs: Clear to auscultation, nonlabored breathing at rest. Cardiac: Regular rate and rhythm, no S3 or significant systolic murmur, no pericardial rub. Abdomen: Soft, nontender, bowel sounds present. Extremities: Bilateral lymphedema, distal pulses 1-2+. Skin: Warm and  dry. Musculoskeletal: No kyphosis. Neuropsychiatric: Alert and oriented x3, affect grossly appropriate.  ECG: I personally reviewed the tracing from 05/31/2017 which showed sinus rhythm.  Recent Labwork: 11/01/2016: BUN 15; Creatinine, Ser 0.71; Hemoglobin 14.1; Platelets 282; Potassium 4.0; Sodium 145   Other Studies Reviewed Today:  Echocardiogram 11/29/2016: Study Conclusions  - Left ventricle: The cavity size was normal. Wall thickness was   normal. Systolic function was vigorous. The estimated ejection   fraction was in the range of 65% to 70%. Left ventricular   diastolic function parameters were normal.  Assessment and Plan:  1.  PSVT, no major breakthrough episodes on Toprol-XL.  She has preferred to hold off on ablation  unless symptoms worsen, has been evaluated by Dr. Ladona Ridgel.  Reviewed her recent ECG which was normal.  2.  Lymphedema, significantly improved with physical therapy and compression methods.  3.  History of normal coronary arteries as of 2615.  No exertional chest pain.  4.  Essential hypertension, blood pressure is well controlled today.  Keep follow-up with Dr. Sherwood Gambler.  Current medicines were reviewed with the patient today.  Disposition: Follow-up in 1 year, sooner if needed.  Signed, Jonelle Sidle, MD, [facility] 07/10/2017 1:49 PM    [facility] at [facility] [address], [place], [place] [address] Phone: [phone]; Fax: [phone]

## 2017-07-10 ENCOUNTER — Ambulatory Visit (HOSPITAL_COMMUNITY): Payer: Medicare Other | Admitting: Physical Therapy

## 2017-07-10 ENCOUNTER — Encounter (HOSPITAL_COMMUNITY): Payer: Self-pay | Admitting: Physical Therapy

## 2017-07-10 ENCOUNTER — Ambulatory Visit (INDEPENDENT_AMBULATORY_CARE_PROVIDER_SITE_OTHER): Payer: Medicare Other | Admitting: Cardiology

## 2017-07-10 ENCOUNTER — Encounter: Payer: Self-pay | Admitting: Cardiology

## 2017-07-10 VITALS — BP 108/78 | HR 99 | Ht 61.0 in | Wt 215.0 lb

## 2017-07-10 DIAGNOSIS — S81802S Unspecified open wound, left lower leg, sequela: Secondary | ICD-10-CM

## 2017-07-10 DIAGNOSIS — S81001D Unspecified open wound, right knee, subsequent encounter: Secondary | ICD-10-CM

## 2017-07-10 DIAGNOSIS — S91001D Unspecified open wound, right ankle, subsequent encounter: Secondary | ICD-10-CM

## 2017-07-10 DIAGNOSIS — I890 Lymphedema, not elsewhere classified: Secondary | ICD-10-CM | POA: Diagnosis not present

## 2017-07-10 DIAGNOSIS — I10 Essential (primary) hypertension: Secondary | ICD-10-CM

## 2017-07-10 DIAGNOSIS — I471 Supraventricular tachycardia: Secondary | ICD-10-CM

## 2017-07-10 DIAGNOSIS — R262 Difficulty in walking, not elsewhere classified: Secondary | ICD-10-CM | POA: Diagnosis not present

## 2017-07-10 DIAGNOSIS — S81801D Unspecified open wound, right lower leg, subsequent encounter: Secondary | ICD-10-CM | POA: Diagnosis not present

## 2017-07-10 NOTE — Therapy (Signed)
[place], [place], [address] Phone: [phone]   Fax:  [phone]  Physical Therapy Treatment  Patient Details  Name: Gina Bradley MRN: [id number] Date of Birth: 09-12-1960 Referring Provider: Cristopher Peru   Encounter Date: 07/10/2017  PT End of Session - 07/10/17 1602    Visit Number  7    Number of Visits  12    Date for PT Re-Evaluation  07/31/17 Reassess weekly on Wed.     Authorization Type  cert period 5/63/87-5/64/33    Authorization - Visit Number  7    Authorization - Number of Visits  12    PT Start Time  2951    PT Stop Time  1552    PT Time Calculation (min)  71 min    Activity Tolerance  Patient tolerated treatment well    Behavior During Therapy  WFL for tasks assessed/performed       Past Medical History:  Diagnosis Date  . Anemia   . Cervical disc disease   . COPD (chronic obstructive pulmonary disease) ([place])   . Diabetes mellitus without complication ([place])   . Essential hypertension   . GERD (gastroesophageal reflux disease)   . Heart failure ([place]) 2018  . History of cardiac catheterization    Normal coronaries March 2015  . History of non-ST elevation myocardial infarction (NSTEMI)    Secondary to SVT  . Peripheral neuropathy   . Spinal stenosis   . SVT (supraventricular tachycardia) (HCC)     Past Surgical History:  Procedure Laterality Date  . CESAREAN SECTION    . ESOPHAGOGASTRODUODENOSCOPY N/A 02/10/2014   OAC:[id number] tiny antral erosion; otherwise normal EGD. No explanation for patient's symptoms. Gallbladder needs further evaluation.  [name] LEFT HEART CATHETERIZATION WITH CORONARY ANGIOGRAM N/A 06/03/2013   Procedure: LEFT HEART CATHETERIZATION WITH CORONARY ANGIOGRAM;  Surgeon: Sinclair Grooms, MD;  Location: [facility] CATH LAB;  Service: Cardiovascular;  Laterality: N/A;    There were no vitals filed for this visit.  Subjective Assessment - 07/10/17 1558    Subjective  Pt  states that she was just feeling bad last week and therefore she did not come for treatment.      How long can you sit comfortably?  no problem    How long can you stand comfortably?  10 minutes     How long can you walk comfortably?  15-20 minutes now    Currently in Pain?  No/denies                       [facility] Adult PT Treatment/Exercise - 07/10/17 0001      Manual Therapy   Manual Therapy  Manual Lymphatic Drainage (MLD);Compression Bandaging    Manual therapy comments  done seperate from all other aspects of treatment.    Manual Lymphatic Drainage (MLD)  to include supraclavicular, deep and superfical abdominal; B inguinal/axillary anastomosis and thigh area .  Manual done anteriorly and posteriorly; posterior done sidelying     Compression Bandaging  B profore with toe wraps and extra cotton.  Wound on Lt medial ankle and Rt posterior LE bladed followed by honey 2x2 prior to bandaging     Other Manual Therapy  woundcare to include debridement with forceps for B LE.  Medihoney x2x2 and medipore tape placed on wounds.  Wounds have scant drainage.                PT Short  Term Goals - 07/10/17 1610      PT SHORT TERM GOAL #1   Title  PT woundson B LE  to have stopped weeping to decrease risk of infection     Time  1    Period  Weeks    Status  Achieved      PT SHORT TERM GOAL #2   Title  PT wounds on B LE  to be 100% granulated     Time  2    Period  Weeks    Status  Partially Met      PT SHORT TERM GOAL #3   Title  PT volume to be decreased by 1.5 cm to allow pt to lift legs onto the beds with greater ease.     Time  2    Period  Weeks    Status  Achieved        PT Long Term Goals - 07/10/17 1610      PT LONG TERM GOAL #1   Title  Pt volume to be decreased by 3 cm to allow pt to don and doff clothes easier.     Time  6    Period  Weeks    Status  Achieved      PT LONG TERM GOAL #2   Title  PT wounds to be healed to decrease risk of infection      Time  4    Period  Weeks    Status  On-going      PT LONG TERM GOAL #3   Title  Pt pain in LE to be no greater than a 3/10 to allow walking to be easier ( pt to be able to walk for five-10 minute without leg pain) . Pt is also limited due to her COPd    Status  Achieved            Plan - 07/10/17 1605    Clinical Impression Statement  Pt forgot her compression stockings again today.  Explained to pt to bring them next visit so we can instruct on how to don doff garments.  Wounds continue to heal; both wounds had 50% granulation prior to debridement and 100% granulation after debridment.  Wounds have scant drainage at this point and can be covered by a bandaid prior to donning compression garment.     Rehab Potential  Good    PT Frequency  3x / week    PT Duration  6 weeks    PT Treatment/Interventions  Patient/family education;Manual techniques;Manual lymph drainage;Compression bandaging    PT Next Visit Plan  Measure wounds and volume.  Instruct in donning and doffing of compression garment.  Give self massaging sheet and make sure pt is comfortable using compression pump.  Pt  maybe ready for discharge early next week.      Consulted and Agree with Plan of Care  Patient       Patient will benefit from skilled therapeutic intervention in order to improve the following deficits and impairments:  Cardiopulmonary status limiting activity, Decreased activity tolerance, Decreased strength, Difficulty walking, Pain, Increased edema, Other (comment)(open wounds)  Visit Diagnosis: Lymphedema, not elsewhere classified  Difficulty in walking, not elsewhere classified  Open wound of left lower leg with complication, sequela  Open wound of right leg, and ankle with complication, subsequent encounter     Problem List Patient Active Problem List   Diagnosis Date Noted  . PMB (postmenopausal bleeding) 10/06/2016  . Chest pressure 01/05/2015  .  Gastric erosion   . Encounter for  screening colonoscopy 01/22/2014  . Constipation 01/20/2014  . GERD (gastroesophageal reflux disease) 01/20/2014  . Dyspepsia 01/20/2014  . SVT (supraventricular tachycardia) ([place]) 05/31/2013  . HTN (hypertension), benign 05/31/2013  . Hypercholesteremia 05/31/2013  . COPD (chronic obstructive pulmonary disease) ([place]) 05/31/2013  . Cervical stenosis of spine 05/31/2013  . Peripheral neuropathy 05/31/2013    Rayetta Humphrey, PT CLT [phone] 07/10/2017, 4:11 PM  [place], [place], [address] Phone: [phone]   Fax:  [phone]  Name: Gina Bradley MRN: [id number] Date of Birth: 1960-08-08

## 2017-07-10 NOTE — Patient Instructions (Signed)
Your physician wants you to follow-up in: 1 year with Dr.McDowell You will receive a reminder letter in the mail two months in advance. If you don't receive a letter, please call our office to schedule the follow-up appointment.    Your physician recommends that you continue on your current medications as directed. Please refer to the Current Medication list given to you today.    If you need a refill on your cardiac medications before your next appointment, please call your pharmacy.     No lab work or tests ordered today.      Thank you for choosing [facility] !        

## 2017-07-12 ENCOUNTER — Encounter (HOSPITAL_COMMUNITY): Payer: Self-pay | Admitting: Physical Therapy

## 2017-07-12 ENCOUNTER — Ambulatory Visit (HOSPITAL_COMMUNITY): Payer: Medicare Other | Admitting: Physical Therapy

## 2017-07-12 DIAGNOSIS — S81802S Unspecified open wound, left lower leg, sequela: Secondary | ICD-10-CM

## 2017-07-12 DIAGNOSIS — S81001D Unspecified open wound, right knee, subsequent encounter: Secondary | ICD-10-CM

## 2017-07-12 DIAGNOSIS — S91001D Unspecified open wound, right ankle, subsequent encounter: Secondary | ICD-10-CM | POA: Diagnosis not present

## 2017-07-12 DIAGNOSIS — R262 Difficulty in walking, not elsewhere classified: Secondary | ICD-10-CM | POA: Diagnosis not present

## 2017-07-12 DIAGNOSIS — S81801D Unspecified open wound, right lower leg, subsequent encounter: Secondary | ICD-10-CM | POA: Diagnosis not present

## 2017-07-12 DIAGNOSIS — I890 Lymphedema, not elsewhere classified: Secondary | ICD-10-CM

## 2017-07-12 NOTE — Therapy (Signed)
[place], [place], [address] Phone: [phone]   Fax:  [phone]  Physical Therapy Treatment  Patient Details  Name: Gina Bradley MRN: [id number] Date of Birth: 05-10-1960 Referring Provider: Cristopher Peru   Encounter Date: 07/12/2017  PT End of Session - 07/12/17 1435    Visit Number  8    Number of Visits  12    Date for PT Re-Evaluation  07/31/17 Reassess weekly on Wed.     Authorization Type  cert period 3/66/29-4/76/54    Authorization - Visit Number  8    Authorization - Number of Visits  12    PT Start Time  1320 PT late     PT Stop Time  1430    PT Time Calculation (min)  70 min    Activity Tolerance  Patient tolerated treatment well    Behavior During Therapy  WFL for tasks assessed/performed       Past Medical History:  Diagnosis Date  . Anemia   . Cervical disc disease   . COPD (chronic obstructive pulmonary disease) ([place])   . Diabetes mellitus without complication ([place])   . Essential hypertension   . GERD (gastroesophageal reflux disease)   . Heart failure ([place]) 2018  . History of cardiac catheterization    Normal coronaries March 2015  . History of non-ST elevation myocardial infarction (NSTEMI)    Secondary to SVT  . Peripheral neuropathy   . Spinal stenosis   . SVT (supraventricular tachycardia) (HCC)     Past Surgical History:  Procedure Laterality Date  . CESAREAN SECTION    . ESOPHAGOGASTRODUODENOSCOPY N/A 02/10/2014   YTK:PTWSFK tiny antral erosion; otherwise normal EGD. No explanation for patient's symptoms. Gallbladder needs further evaluation.  [name] LEFT HEART CATHETERIZATION WITH CORONARY ANGIOGRAM N/A 06/03/2013   Procedure: LEFT HEART CATHETERIZATION WITH CORONARY ANGIOGRAM;  Surgeon: Sinclair Grooms, MD;  Location: [facility] CATH LAB;  Service: Cardiovascular;  Laterality: N/A;    There were no vitals filed for this visit.  Subjective Assessment - 07/12/17 1318    Subjective  Pt states that her legs feel better. She only has pain every now and then now.  She has brought her compression garments in today.    How long can you sit comfortably?  no problem    How long can you stand comfortably?  10 minutes     How long can you walk comfortably?  15-20 minutes now    Currently in Pain?  No/denies            LYMPHEDEMA/ONCOLOGY QUESTIONNAIRE - 07/12/17 1330      Right Lower Extremity Lymphedema   20 cm Proximal to Suprapatella  70.3 cm was 71    10 cm Proximal to Suprapatella  59 cm was 63    At Midpatella/Popliteal Crease  43 cm was 44    30 cm Proximal to Floor at Lateral Plantar Foot  38.4 cm was 42    20 cm Proximal to Floor at Lateral Plantar Foot  31 1 eval 33.5    10 cm Proximal to Floor at Lateral Malleoli  22.5 cm eval 23.5    Circumference of ankle/heel  30.3 cm. 31 at eval    5 cm Proximal to 1st MTP Joint  23.7 cm eval 26    Across MTP Joint  22.7 cm was 24    Around Proximal Great Toe  8 cm was 9    Other  1.0 x .4 x .2 was 1.6x2x.3     Other  wound was debrided followed by honey 2x2 and tape.       Left Lower Extremity Lymphedema   At Groin Measure at Horizontal from Pubic Bone  0 cm    20 cm Proximal to Suprapatella  67 cm eval was 68.4    10 cm Proximal to Suprapatella  58 cm was 63.7    At Midpatella/Popliteal Crease  45 cm was 46    30 cm Proximal to Floor at Lateral Plantar Foot  39.5 cm eval 44    20 cm Proximal to Floor at Lateral Plantar Foot  30 cm was 34.5    10 cm Proximal to Floor at Lateral Malleoli  22.9 cm eval 23.7    Circumference of ankle/heel  29.7 cm.    5 cm Proximal to 1st MTP Joint  22.4 cm was 24.7    Across MTP Joint  22 cm was  23.2    Around Proximal Great Toe  8.5 cm was 8    Other  .2x.3x.3    Other  debrided base of wound an epiboled edges .Dressed with medihoney 2x2 and tape                 [facility] Adult PT Treatment/Exercise - 07/12/17 0001      Manual Therapy   Manual Therapy   Manual Lymphatic Drainage (MLD)    Manual therapy comments  done seperate from all other aspects of treatment.    Manual Lymphatic Drainage (MLD)  Instructed in self decongestive techniques for both Rt and LT LE,  Reviewed 2 x but will need reinforcement  .  Donning garment with Melina Copa    Compression Bandaging  compression garment              PT Education - 07/12/17 1434    Education provided  Yes    Education Details  self decongestive techniques and how to don compression garment with [employer]) Educated  Patient    Methods  Explanation;Demonstration    Comprehension  Verbalized understanding;Returned demonstration       PT Short Term Goals - 07/10/17 1610      PT SHORT TERM GOAL #1   Title  PT woundson B LE  to have stopped weeping to decrease risk of infection     Time  1    Period  Weeks    Status  Achieved      PT SHORT TERM GOAL #2   Title  PT wounds on B LE  to be 100% granulated     Time  2    Period  Weeks    Status  Partially Met      PT SHORT TERM GOAL #3   Title  PT volume to be decreased by 1.5 cm to allow pt to lift legs onto the beds with greater ease.     Time  2    Period  Weeks    Status  Achieved        PT Long Term Goals - 07/10/17 1610      PT LONG TERM GOAL #1   Title  Pt volume to be decreased by 3 cm to allow pt to don and doff clothes easier.     Time  6    Period  Weeks    Status  Achieved      PT LONG TERM GOAL #2   Title  PT wounds to be healed to decrease risk of infection     Time  4    Period  Weeks    Status  On-going      PT LONG TERM GOAL #3   Title  Pt pain in LE to be no greater than a 3/10 to allow walking to be easier ( pt to be able to walk for five-10 minute without leg pain) . Pt is also limited due to her COPd    Status  Achieved            Plan - 07/12/17 1625    Clinical Impression Statement  Treatment focused on education to get pt ready for discharge.  Pt volume and wounds measured with  reduction noted in wound size with increased granulation, volume of edema is decreased from evaluation but showing signs of stability at this time.  Pt instructed and able to demonstrate donning of compression garment with butler but pt caused a run in her garmet witn her nails.  Therapist explained that this ruins the hose and pt should cut her nails down short.  Pt and therapist also went over self massaging techniques.   Therapist explained that pt should begin using her compression pump for one hour a day at this time.  Pt was given a copy of the prescription for her compression garment and sheet with companies where she can purchase new garments as well as a copy of the self massaging.    Rehab Potential  Good    PT Frequency  3x / week    PT Duration  6 weeks    PT Treatment/Interventions  Patient/family education;Manual techniques;Manual lymph drainage;Compression bandaging    PT Next Visit Plan  Review self massaging techniques and donning and doffing of compression garments.  Continue with wound care and inquire whether pt is using her compression pump.  Pt should be ready for discharge by the end of next week.  Measure on Wednesdays.     Consulted and Agree with Plan of Care  Patient       Patient will benefit from skilled therapeutic intervention in order to improve the following deficits and impairments:  Cardiopulmonary status limiting activity, Decreased activity tolerance, Decreased strength, Difficulty walking, Pain, Increased edema, Other (comment)(open wounds)  Visit Diagnosis: Lymphedema, not elsewhere classified  Open wound of left lower leg with complication, sequela  Open wound of right leg, and ankle with complication, subsequent encounter     Problem List Patient Active Problem List   Diagnosis Date Noted  . PMB (postmenopausal bleeding) 10/06/2016  . Chest pressure 01/05/2015  . Gastric erosion   . Encounter for screening colonoscopy 01/22/2014  . Constipation  01/20/2014  . GERD (gastroesophageal reflux disease) 01/20/2014  . Dyspepsia 01/20/2014  . SVT (supraventricular tachycardia) ([place]) 05/31/2013  . HTN (hypertension), benign 05/31/2013  . Hypercholesteremia 05/31/2013  . COPD (chronic obstructive pulmonary disease) ([place]) 05/31/2013  . Cervical stenosis of spine 05/31/2013  . Peripheral neuropathy 05/31/2013    Rayetta Humphrey, PT CLT [phone] 07/12/2017, 4:32 PM  [place] [address] [place], [place], [address] Phone: [phone]   Fax:  [phone]  Name: Gina Bradley MRN: [id number] Date of Birth: 11-Apr-1960

## 2017-07-17 ENCOUNTER — Encounter (HOSPITAL_COMMUNITY): Payer: Medicare Other | Admitting: Physical Therapy

## 2017-07-18 ENCOUNTER — Ambulatory Visit (HOSPITAL_COMMUNITY): Payer: Medicare Other | Admitting: Physical Therapy

## 2017-07-19 ENCOUNTER — Encounter (HOSPITAL_COMMUNITY): Payer: Medicare Other | Admitting: Physical Therapy

## 2017-07-20 ENCOUNTER — Ambulatory Visit (HOSPITAL_COMMUNITY): Payer: Medicare Other | Admitting: Physical Therapy

## 2017-07-20 ENCOUNTER — Telehealth (HOSPITAL_COMMUNITY): Payer: Self-pay | Admitting: Internal Medicine

## 2017-07-20 NOTE — Telephone Encounter (Signed)
07/20/17  pt left a message to cx - her daughter was in a car accident and she has to go check on her

## 2017-07-21 ENCOUNTER — Encounter (HOSPITAL_COMMUNITY): Payer: Medicare Other | Admitting: Physical Therapy

## 2017-07-24 ENCOUNTER — Ambulatory Visit (HOSPITAL_COMMUNITY): Payer: Medicare Other | Admitting: Physical Therapy

## 2017-07-24 DIAGNOSIS — S81801D Unspecified open wound, right lower leg, subsequent encounter: Secondary | ICD-10-CM | POA: Diagnosis not present

## 2017-07-24 DIAGNOSIS — S81001D Unspecified open wound, right knee, subsequent encounter: Secondary | ICD-10-CM

## 2017-07-24 DIAGNOSIS — S91001D Unspecified open wound, right ankle, subsequent encounter: Secondary | ICD-10-CM | POA: Diagnosis not present

## 2017-07-24 DIAGNOSIS — S81802S Unspecified open wound, left lower leg, sequela: Secondary | ICD-10-CM

## 2017-07-24 DIAGNOSIS — R262 Difficulty in walking, not elsewhere classified: Secondary | ICD-10-CM | POA: Diagnosis not present

## 2017-07-24 DIAGNOSIS — I890 Lymphedema, not elsewhere classified: Secondary | ICD-10-CM | POA: Diagnosis not present

## 2017-07-24 NOTE — Therapy (Signed)
[place], [place], [address] Phone: [phone]   Fax:  [phone]  Physical Therapy Treatment  Patient Details  Name: Gina Bradley MRN: [id number] Date of Birth: 1961/02/09 Referring Provider: Cristopher Peru   Encounter Date: 07/24/2017  PT End of Session - 07/24/17 1615    Visit Number  9    Number of Visits  12    Date for PT Re-Evaluation  07/31/17 Reassess weekly on Wed.     Authorization Type  cert period 07/31/74-11/13/55    Authorization - Visit Number  9    Authorization - Number of Visits  12    PT Start Time  2620    PT Stop Time  1600    PT Time Calculation (min)  45 min    Activity Tolerance  Patient tolerated treatment well    Behavior During Therapy  WFL for tasks assessed/performed       Past Medical History:  Diagnosis Date  . Anemia   . Cervical disc disease   . COPD (chronic obstructive pulmonary disease) ([place])   . Diabetes mellitus without complication ([place])   . Essential hypertension   . GERD (gastroesophageal reflux disease)   . Heart failure ([place]) 2018  . History of cardiac catheterization    Normal coronaries March 2015  . History of non-ST elevation myocardial infarction (NSTEMI)    Secondary to SVT  . Peripheral neuropathy   . Spinal stenosis   . SVT (supraventricular tachycardia) ([facility])     Past Surgical History:  Procedure Laterality Date  . CESAREAN SECTION    . ESOPHAGOGASTRODUODENOSCOPY N/A 02/10/2014   BTD:HRCBUL tiny antral erosion; otherwise normal EGD. No explanation for patient's symptoms. Gallbladder needs further evaluation.  [name] LEFT HEART CATHETERIZATION WITH CORONARY ANGIOGRAM N/A 06/03/2013   Procedure: LEFT HEART CATHETERIZATION WITH CORONARY ANGIOGRAM;  Surgeon: Sinclair Grooms, MD;  Location: [facility] CATH LAB;  Service: Cardiovascular;  Laterality: N/A;    There were no vitals filed for this visit.  Subjective Assessment - 07/24/17 1630    Subjective  Pt  statess she's been having family issues and that is why she has missed all last week and showed late today for her appointment.  Comes today without compression or any dressings on her wounds.     Currently in Pain?  No/denies            LYMPHEDEMA/ONCOLOGY QUESTIONNAIRE - 07/24/17 1519      Right Lower Extremity Lymphedema   20 cm Proximal to Suprapatella  68.8 cm    10 cm Proximal to Suprapatella  58 cm    At Midpatella/Popliteal Crease  42.5 cm    30 cm Proximal to Floor at Lateral Plantar Foot  41 cm    20 cm Proximal to Floor at Lateral Plantar Foot  35 1    10 cm Proximal to Floor at Lateral Malleoli  25.4 cm    Circumference of ankle/heel  33.2 cm.    5 cm Proximal to 1st MTP Joint  26.2 cm    Across MTP Joint  24.3 cm    Around Proximal Great Toe  9 cm    Other  posterior LE wound: 1X0.4X.01      Left Lower Extremity Lymphedema   20 cm Proximal to Suprapatella  68.5 cm    10 cm Proximal to Suprapatella  61 cm    At Midpatella/Popliteal Crease  45 cm    30 cm Proximal to  Floor at Lateral Plantar Foot  40.7 cm    20 cm Proximal to Floor at Lateral Plantar Foot  35.3 cm    10 cm Proximal to Floor at Lateral Malleoli  26 cm    Circumference of ankle/heel  33.1 cm.    5 cm Proximal to 1st MTP Joint  26 cm    Across MTP Joint  25 cm    Around Proximal Great Toe  9.1 cm    Other  medial malleolis wound: 0.2X0.2X0.2 cm                          PT Short Term Goals - 07/10/17 1610      PT SHORT TERM GOAL #1   Title  PT woundson B LE  to have stopped weeping to decrease risk of infection     Time  1    Period  Weeks    Status  Achieved      PT SHORT TERM GOAL #2   Title  PT wounds on B LE  to be 100% granulated     Time  2    Period  Weeks    Status  Partially Met      PT SHORT TERM GOAL #3   Title  PT volume to be decreased by 1.5 cm to allow pt to lift legs onto the beds with greater ease.     Time  2    Period  Weeks    Status  Achieved         PT Long Term Goals - 07/10/17 1610      PT LONG TERM GOAL #1   Title  Pt volume to be decreased by 3 cm to allow pt to don and doff clothes easier.     Time  6    Period  Weeks    Status  Achieved      PT LONG TERM GOAL #2   Title  PT wounds to be healed to decrease risk of infection     Time  4    Period  Weeks    Status  On-going      PT LONG TERM GOAL #3   Title  Pt pain in LE to be no greater than a 3/10 to allow walking to be easier ( pt to be able to walk for five-10 minute without leg pain) . Pt is also limited due to her COPd    Status  Achieved            Plan - 07/24/17 1617    Clinical Impression Statement  Pt nearly 45 minutes late for her appointment.  comes today without compression garments on her LE's and noted increase in edema.  Pt states she has been wearing her old compression garments everyday.  Also comes  with box of new 20-40mHg thigh highs that she got last week but have not been worn.  Reiterated the importance of keeping appointments, showing on time and being compliant with compression garments.  LE's remeasured with increased edema, more than initial evaluation in distal aspect of LE's, about the same proximally.  Dressed wounds wtih xeroform and used profore today to help get edema back down before using the garments.  Instructed to bring garments and butler to each visit as she may be ready for them.     Rehab Potential  Good    PT Frequency  3x / week    PT  Duration  6 weeks    PT Treatment/Interventions  Patient/family education;Manual techniques;Manual lymph drainage;Compression bandaging    PT Next Visit Plan  Continue with wound care and lymph therepy.  Inquire whether pt is using her compression pump.  Reassess next session and remeasure.    Consulted and Agree with Plan of Care  Patient       Patient will benefit from skilled therapeutic intervention in order to improve the following deficits and impairments:  Cardiopulmonary status  limiting activity, Decreased activity tolerance, Decreased strength, Difficulty walking, Pain, Increased edema, Other (comment)(open wounds)  Visit Diagnosis: Lymphedema, not elsewhere classified  Open wound of left lower leg with complication, sequela  Open wound of right leg, and ankle with complication, subsequent encounter  Difficulty in walking, not elsewhere classified     Problem List Patient Active Problem List   Diagnosis Date Noted  . PMB (postmenopausal bleeding) 10/06/2016  . Chest pressure 01/05/2015  . Gastric erosion   . Encounter for screening colonoscopy 01/22/2014  . Constipation 01/20/2014  . GERD (gastroesophageal reflux disease) 01/20/2014  . Dyspepsia 01/20/2014  . SVT (supraventricular tachycardia) ([place]) 05/31/2013  . HTN (hypertension), benign 05/31/2013  . Hypercholesteremia 05/31/2013  . COPD (chronic obstructive pulmonary disease) ([place]) 05/31/2013  . Cervical stenosis of spine 05/31/2013  . Peripheral neuropathy 05/31/2013   Teena Irani, PTA/CLT [phone]  Teena Irani 07/24/2017, 4:30 PM  [place] [place] [address] [place], [place], [address] Phone: [phone]   Fax:  [phone]  Name: Gina Bradley MRN: [id number] Date of Birth: [birth year]

## 2017-07-26 ENCOUNTER — Encounter (HOSPITAL_COMMUNITY): Payer: Self-pay | Admitting: Physical Therapy

## 2017-07-26 ENCOUNTER — Ambulatory Visit (HOSPITAL_COMMUNITY): Payer: Medicare Other | Admitting: Physical Therapy

## 2017-07-26 DIAGNOSIS — S81801D Unspecified open wound, right lower leg, subsequent encounter: Secondary | ICD-10-CM

## 2017-07-26 DIAGNOSIS — S81001D Unspecified open wound, right knee, subsequent encounter: Secondary | ICD-10-CM

## 2017-07-26 DIAGNOSIS — S91001D Unspecified open wound, right ankle, subsequent encounter: Secondary | ICD-10-CM

## 2017-07-26 DIAGNOSIS — R262 Difficulty in walking, not elsewhere classified: Secondary | ICD-10-CM | POA: Diagnosis not present

## 2017-07-26 DIAGNOSIS — S81802S Unspecified open wound, left lower leg, sequela: Secondary | ICD-10-CM | POA: Diagnosis not present

## 2017-07-26 DIAGNOSIS — I890 Lymphedema, not elsewhere classified: Secondary | ICD-10-CM

## 2017-07-26 NOTE — Therapy (Signed)
[facility] [address] [place], [place], [address] Phone: [phone]   Fax:  [phone]  Physical Therapy Treatment  Patient Details  Name: Gina Bradley MRN: [id number] Date of Birth: Jan 30, 1961 Referring Provider: Cristopher Peru  Progress Note Reporting Period 06/15/2017 to 07/26/2017  See note below for Objective Data and Assessment of Progress/Goals.      Encounter Date: 07/26/2017  PT End of Session - 07/26/17 1432    Visit Number  10    Number of Visits  12    Date for PT Re-Evaluation  07/31/17 Reassess weekly on Wed.     Authorization Type  cert period 9/52/84-1/32/44    Authorization - Visit Number  10    Authorization - Number of Visits  12    PT Start Time  0102 pt late for appointment     PT Stop Time  1430    PT Time Calculation (min)  75 min    Activity Tolerance  Patient tolerated treatment well    Behavior During Therapy  WFL for tasks assessed/performed       Past Medical History:  Diagnosis Date  . Anemia   . Cervical disc disease   . COPD (chronic obstructive pulmonary disease) ([place])   . Diabetes mellitus without complication ([place])   . Essential hypertension   . GERD (gastroesophageal reflux disease)   . Heart failure ([place]) 2018  . History of cardiac catheterization    Normal coronaries March 2015  . History of non-ST elevation myocardial infarction (NSTEMI)    Secondary to SVT  . Peripheral neuropathy   . Spinal stenosis   . SVT (supraventricular tachycardia) ([facility])     Past Surgical History:  Procedure Laterality Date  . CESAREAN SECTION    . ESOPHAGOGASTRODUODENOSCOPY N/A 02/10/2014   VOZ:DGUYQI tiny antral erosion; otherwise normal EGD. No explanation for patient's symptoms. Gallbladder needs further evaluation.  [name] LEFT HEART CATHETERIZATION WITH CORONARY ANGIOGRAM N/A 06/03/2013   Procedure: LEFT HEART CATHETERIZATION WITH CORONARY ANGIOGRAM;  Surgeon: Sinclair Grooms, MD;  Location: [facility] CATH  LAB;  Service: Cardiovascular;  Laterality: N/A;    There were no vitals filed for this visit.  Subjective Assessment - 07/26/17 1326    Subjective  PT states that her toes were not wrapped and the next day she noticed blisters had appeared on her Rt foot and her Lt foot and toe     How long can you sit comfortably?  no problem    How long can you stand comfortably?  10 minutes     How long can you walk comfortably?  15-20 minutes now    Currently in Pain?  No/denies            LYMPHEDEMA/ONCOLOGY QUESTIONNAIRE - 07/26/17 1326      Right Lower Extremity Lymphedema   20 cm Proximal to Suprapatella  72 cm    10 cm Proximal to Suprapatella  63.3 cm    At Midpatella/Popliteal Crease  45.2 cm    30 cm Proximal to Floor at Lateral Plantar Foot  38.8 cm    20 cm Proximal to Floor at Lateral Plantar Foot  31.4 1    10  cm Proximal to Floor at Lateral Malleoli  24.2 cm    Circumference of ankle/heel  30.5 cm.    5 cm Proximal to 1st MTP Joint  23.9 cm    Across MTP Joint  24.2 cm    Around Proximal Great Toe  10 cm    Other  Blister at 2nd and 3rd MTP and on great toe       Left Lower Extremity Lymphedema   20 cm Proximal to Suprapatella  72.5 cm    10 cm Proximal to Suprapatella  61.2 cm    At Midpatella/Popliteal Crease  45.7 cm    30 cm Proximal to Floor at Lateral Plantar Foot  39.2 cm    20 cm Proximal to Floor at Lateral Plantar Foot  31.2 cm    10 cm Proximal to Floor at Lateral Malleoli  23 cm    Circumference of ankle/heel  32 cm.    5 cm Proximal to 1st MTP Joint  24.5 cm    Across MTP Joint  24.5 cm    Other  blister at 1st and 2nd MTP and 2nd blister at 4th MTP     Other  --                [facility] Adult PT Treatment/Exercise - 07/26/17 0001      Manual Therapy   Manual Therapy  Manual Lymphatic Drainage (MLD)    Manual therapy comments  done seperate from all other aspects of treatment.    Manual Lymphatic Drainage (MLD)  to include supraclavicular, deep  and superfical abdominal; B inguinal/axillary anastomosis and thigh area .  Manual done anteriorly and posteriorly; posterior done sidelying     Compression Bandaging  B profore with toe wraps and extra cotton.  Wound on Lt medial ankle and Rt posterior LE bladed followed by honey 2x2 prior to bandaging     Other Manual Therapy  blisters popped with xeroform placed over wounds prior to bandaging                PT Short Term Goals - 07/10/17 1610      PT SHORT TERM GOAL #1   Title  PT woundson B LE  to have stopped weeping to decrease risk of infection     Time  1    Period  Weeks    Status  Achieved      PT SHORT TERM GOAL #2   Title  PT wounds on B LE  to be 100% granulated     Time  2    Period  Weeks    Status  Partially Met      PT SHORT TERM GOAL #3   Title  PT volume to be decreased by 1.5 cm to allow pt to lift legs onto the beds with greater ease.     Time  2    Period  Weeks    Status  Achieved        PT Long Term Goals - 07/10/17 1610      PT LONG TERM GOAL #1   Title  Pt volume to be decreased by 3 cm to allow pt to don and doff clothes easier.     Time  6    Period  Weeks    Status  Achieved      PT LONG TERM GOAL #2   Title  PT wounds to be healed to decrease risk of infection     Time  4    Period  Weeks    Status  On-going      PT LONG TERM GOAL #3   Title  Pt pain in LE to be no greater than a 3/10 to allow walking to be easier ( pt to be  able to walk for five-10 minute without leg pain) . Pt is also limited due to her COPd    Status  Achieved            Plan - 07/26/17 1433    Clinical Impression Statement  Pt 15 minutes late for her appointment.  Toes were not wrapped last session and pt comes to the department with blisters on both feet; please see note.  Pt encouraged to acquire spandex for stomach and upper thigh to control edema.  Pt has ordered compression garment but it has not came in yet.  Pt remeasured.  Thigh area is up but LE  appears to have stabilized. Pt has her pump and has been instructed in self massage.  As soon as wounds have healed and pt has compression garment she will be ready for discharge.     Rehab Potential  Good    PT Frequency  3x / week    PT Duration  6 weeks    PT Treatment/Interventions  Patient/family education;Manual techniques;Manual lymph drainage;Compression bandaging    PT Next Visit Plan  Continue with wound care and lymph therepy.    Consulted and Agree with Plan of Care  Patient       Patient will benefit from skilled therapeutic intervention in order to improve the following deficits and impairments:  Cardiopulmonary status limiting activity, Decreased activity tolerance, Decreased strength, Difficulty walking, Pain, Increased edema, Other (comment)(open wounds)  Visit Diagnosis: Lymphedema, not elsewhere classified  Open wound of left lower leg with complication, sequela  Open wound of right leg, and ankle with complication, subsequent encounter     Problem List Patient Active Problem List   Diagnosis Date Noted  . PMB (postmenopausal bleeding) 10/06/2016  . Chest pressure 01/05/2015  . Gastric erosion   . Encounter for screening colonoscopy 01/22/2014  . Constipation 01/20/2014  . GERD (gastroesophageal reflux disease) 01/20/2014  . Dyspepsia 01/20/2014  . SVT (supraventricular tachycardia) ([place]) 05/31/2013  . HTN (hypertension), benign 05/31/2013  . Hypercholesteremia 05/31/2013  . COPD (chronic obstructive pulmonary disease) ([place]) 05/31/2013  . Cervical stenosis of spine 05/31/2013  . Peripheral neuropathy 05/31/2013    Rayetta Humphrey, PT CLT [phone]  07/26/2017, 2:36 PM  [place] [address] [place], [place], [address] Phone: [phone]   Fax:  [phone]  Name: SHANAY WOOLMAN MRN: [id number] Date of Birth: [birth year]

## 2017-07-27 ENCOUNTER — Ambulatory Visit (HOSPITAL_COMMUNITY): Payer: Medicare Other | Admitting: Physical Therapy

## 2017-07-28 ENCOUNTER — Ambulatory Visit (HOSPITAL_COMMUNITY): Payer: Medicare Other | Admitting: Physical Therapy

## 2017-07-28 ENCOUNTER — Encounter (HOSPITAL_COMMUNITY): Payer: Self-pay | Admitting: Physical Therapy

## 2017-07-28 DIAGNOSIS — S81802S Unspecified open wound, left lower leg, sequela: Secondary | ICD-10-CM | POA: Diagnosis not present

## 2017-07-28 DIAGNOSIS — S81801D Unspecified open wound, right lower leg, subsequent encounter: Secondary | ICD-10-CM | POA: Diagnosis not present

## 2017-07-28 DIAGNOSIS — R262 Difficulty in walking, not elsewhere classified: Secondary | ICD-10-CM | POA: Diagnosis not present

## 2017-07-28 DIAGNOSIS — S81001D Unspecified open wound, right knee, subsequent encounter: Secondary | ICD-10-CM | POA: Diagnosis not present

## 2017-07-28 DIAGNOSIS — I890 Lymphedema, not elsewhere classified: Secondary | ICD-10-CM

## 2017-07-28 DIAGNOSIS — S91001D Unspecified open wound, right ankle, subsequent encounter: Secondary | ICD-10-CM

## 2017-07-28 NOTE — Therapy (Signed)
[facility] [address] [place], [place], [address] Phone: [phone]   Fax:  [phone]  Physical Therapy Treatment  Patient Details  Name: Gina Bradley MRN: [id number] Date of Birth: 09/30/1960 Referring Provider: Lewayne Bunting   Encounter Date: 07/28/2017  PT End of Session - 07/28/17 1607    Visit Number  11    Number of Visits  12    Date for PT Re-Evaluation  07/31/17 Reassess weekly on Wed.     Authorization Type  cert period 6/96/29-08/30/39    Authorization - Visit Number  11    Authorization - Number of Visits  12    PT Start Time  1445 pt late for appointment    PT Stop Time  1600    PT Time Calculation (min)  75 min    Activity Tolerance  Patient tolerated treatment well    Behavior During Therapy  WFL for tasks assessed/performed       Past Medical History:  Diagnosis Date  . Anemia   . Cervical disc disease   . COPD (chronic obstructive pulmonary disease) ([facility])   . Diabetes mellitus without complication ([facility])   . Essential hypertension   . GERD (gastroesophageal reflux disease)   . Heart failure ([facility]) 2018  . History of cardiac catheterization    Normal coronaries March 2015  . History of non-ST elevation myocardial infarction (NSTEMI)    Secondary to SVT  . Peripheral neuropathy   . Spinal stenosis   . SVT (supraventricular tachycardia) ([facility])     Past Surgical History:  Procedure Laterality Date  . CESAREAN SECTION    . ESOPHAGOGASTRODUODENOSCOPY N/A 02/10/2014   LKG:MWNUUV tiny antral erosion; otherwise normal EGD. No explanation for patient's symptoms. Gallbladder needs further evaluation.  [name] LEFT HEART CATHETERIZATION WITH CORONARY ANGIOGRAM N/A 06/03/2013   Procedure: LEFT HEART CATHETERIZATION WITH CORONARY ANGIOGRAM;  Surgeon: Lesleigh Noe, MD;  Location: [facility] CATH LAB;  Service: Cardiovascular;  Laterality: N/A;    There were no vitals filed for this visit.  Subjective Assessment - 07/28/17  1604    Subjective  Pt has no complaints states that she is not in any pain     How long can you sit comfortably?  no problem    How long can you stand comfortably?  10 minutes     How long can you walk comfortably?  15-20 minutes now    Currently in Pain?  No/denies             [facility] Adult PT Treatment/Exercise - 07/28/17 0001      Manual Therapy   Manual Therapy  Manual Lymphatic Drainage (MLD)    Manual therapy comments  done seperate from all other aspects of treatment.    Manual Lymphatic Drainage (MLD)  to include supraclavicular, deep and superfical abdominal; B inguinal/axillary anastomosis and thigh area .  Manual done anteriorly and posteriorly; posterior done sidelying     Compression Bandaging  B profore with toe wraps honey    Other Manual Therapy  areas where blisters appeared are clean with no debridement needed but areas are not healed therefore theapist placed medihoney followed by 2x2 and medipore tape prior to donning  profore               PT Short Term Goals - 07/28/17 1613      PT SHORT TERM GOAL #1   Title  PT woundson B LE  to have stopped weeping to  decrease risk of infection     Time  1    Period  Weeks    Status  Achieved      PT SHORT TERM GOAL #2   Title  PT wounds on B LE  to be 100% granulated     Baseline  initial wound are healed but pt developed new wounds which have not totally healed.     Time  2    Period  Weeks    Status  Achieved      PT SHORT TERM GOAL #3   Title  PT volume to be decreased by 1.5 cm to allow pt to lift legs onto the beds with greater ease.     Time  2    Period  Weeks    Status  Achieved        PT Long Term Goals - 07/28/17 1615      PT LONG TERM GOAL #1   Title  Pt volume to be decreased by 3 cm to allow pt to don and doff clothes easier.     Time  6    Period  Weeks    Status  Achieved      PT LONG TERM GOAL #2   Title  PT wounds to be healed to decrease risk of infection     Baseline  as  stated before inital wounds have healed but pt has new wounds which developed the week of 07/23/2017    Time  4    Period  Weeks    Status  Achieved      PT LONG TERM GOAL #3   Title  Pt pain in LE to be no greater than a 3/10 to allow walking to be easier ( pt to be able to walk for five-10 minute without leg pain) . Pt is also limited due to her COPd    Status  Achieved            Plan - 07/28/17 1608    Clinical Impression Statement  Pt 15 minutes late for her appointment again.  Pt continues to have wounds where she developed blisters from not having toes wrapped but wounds are clean without drainage and anticipate them to be healed next week.  Pt will be ready for compression garment at this time as her volume in her legs have stabilizedl  Pt will need to acquired 20-30 mm hg vs. the 15-20 garments that she owns.     Rehab Potential  Good    PT Frequency  3x / week    PT Duration  6 weeks    PT Treatment/Interventions  Patient/family education;Manual techniques;Manual lymph drainage;Compression bandaging    PT Next Visit Plan  Anticipate discharge next week.  Next treatment measure volume if wounds are healed and volume stable may send pt directly to [facility] for measurement for compression garment.  Explain to pt that she needs to be wearing 20-30 mm hg.  If pt is not ready for discharge on 4/29 she will need new certification.     Consulted and Agree with Plan of Care  Patient       Patient will benefit from skilled therapeutic intervention in order to improve the following deficits and impairments:  Cardiopulmonary status limiting activity, Decreased activity tolerance, Decreased strength, Difficulty walking, Pain, Increased edema, Other (comment)(open wounds)  Visit Diagnosis: Lymphedema, not elsewhere classified  Open wound of left lower leg with complication, sequela  Open wound of right leg,  and ankle with complication, subsequent encounter     Problem  List Patient Active Problem List   Diagnosis Date Noted  . PMB (postmenopausal bleeding) 10/06/2016  . Chest pressure 01/05/2015  . Gastric erosion   . Encounter for screening colonoscopy 01/22/2014  . Constipation 01/20/2014  . GERD (gastroesophageal reflux disease) 01/20/2014  . Dyspepsia 01/20/2014  . SVT (supraventricular tachycardia) ([facility]) 05/31/2013  . HTN (hypertension), benign 05/31/2013  . Hypercholesteremia 05/31/2013  . COPD (chronic obstructive pulmonary disease) ([facility]) 05/31/2013  . Cervical stenosis of spine 05/31/2013  . Peripheral neuropathy 05/31/2013  Virgina Organynthia Charmel Pronovost, PT CLT [phone] 07/28/2017, 4:17 PM  [facility] [address] [place], [place], [address] Phone: [phone]   Fax:  [phone]  Name: Gina Bradley MRN: [id number] Date of Birth: [birth year]

## 2017-07-31 ENCOUNTER — Ambulatory Visit (HOSPITAL_COMMUNITY): Payer: Medicare Other | Admitting: Physical Therapy

## 2017-07-31 DIAGNOSIS — S91001D Unspecified open wound, right ankle, subsequent encounter: Secondary | ICD-10-CM

## 2017-07-31 DIAGNOSIS — R262 Difficulty in walking, not elsewhere classified: Secondary | ICD-10-CM | POA: Diagnosis not present

## 2017-07-31 DIAGNOSIS — S81001D Unspecified open wound, right knee, subsequent encounter: Secondary | ICD-10-CM

## 2017-07-31 DIAGNOSIS — I890 Lymphedema, not elsewhere classified: Secondary | ICD-10-CM

## 2017-07-31 DIAGNOSIS — S81802S Unspecified open wound, left lower leg, sequela: Secondary | ICD-10-CM

## 2017-07-31 DIAGNOSIS — S81801D Unspecified open wound, right lower leg, subsequent encounter: Secondary | ICD-10-CM | POA: Diagnosis not present

## 2017-07-31 NOTE — Therapy (Addendum)
[facility] [address] [place], [place], [address] Phone: [phone]   Fax:  [phone]  Physical Therapy Treatment  Patient Details  Name: Gina Bradley MRN: [id number] Date of Birth: [birth year] Referring Provider: Lewayne Bunting    Encounter Date: 07/31/2017  PT End of Session - 07/31/17 1606    Visit Number  12    Number of Visits  18    Date for PT Re-Evaluation  07/31/17 Reassess weekly on Wed.     Authorization Type  cert period 6/96/29-08/30/39; recert to 5/21     Authorization - Visit Number  11    Authorization - Number of Visits  18    PT Start Time  1500    PT Stop Time  1600    PT Time Calculation (min)  60 min    Activity Tolerance  Patient tolerated treatment well    Behavior During Therapy  WFL for tasks assessed/performed       Past Medical History:  Diagnosis Date  . Anemia   . Cervical disc disease   . COPD (chronic obstructive pulmonary disease) (HCC)   . Diabetes mellitus without complication (HCC)   . Essential hypertension   . GERD (gastroesophageal reflux disease)   . Heart failure (HCC) 2018  . History of cardiac catheterization    Normal coronaries March 2015  . History of non-ST elevation myocardial infarction (NSTEMI)    Secondary to SVT  . Peripheral neuropathy   . Spinal stenosis   . SVT (supraventricular tachycardia) (HCC)     Past Surgical History:  Procedure Laterality Date  . CESAREAN SECTION    . ESOPHAGOGASTRODUODENOSCOPY N/A 02/10/2014   LKG:MWNUUV tiny antral erosion; otherwise normal EGD. No explanation for patient's symptoms. Gallbladder needs further evaluation.  [name] LEFT HEART CATHETERIZATION WITH CORONARY ANGIOGRAM N/A 06/03/2013   Procedure: LEFT HEART CATHETERIZATION WITH CORONARY ANGIOGRAM;  Surgeon: Lesleigh Noe, MD;  Location: [facility] CATH LAB;  Service: Cardiovascular;  Laterality: N/A;    There were no vitals filed for this visit.  Subjective Assessment - 07/31/17 1601     Subjective  Pt voices no complaints.  Does not seem concerned that she is 30 minutes late for appointment.     How long can you sit comfortably?  no problem    How long can you stand comfortably?  10 minutes     How long can you walk comfortably?  15-20 minutes now    Currently in Pain?  No/denies         [facility] PT Assessment - 07/31/17 0001      Assessment   Medical Diagnosis  B LE Lymphedema; head and neck lymphedema     Referring Provider  Lewayne Bunting     Next MD Visit  06/03/2018    Prior Therapy  none       Precautions   Precautions  None        LYMPHEDEMA/ONCOLOGY QUESTIONNAIRE - 07/31/17 1609 measurements from 07/26/2017     Right Lower Extremity Lymphedema   20 cm Proximal to Suprapatella  72 cm    10 cm Proximal to Suprapatella  63.3 cm    At Midpatella/Popliteal Crease  45.2 cm    30 cm Proximal to Floor at Lateral Plantar Foot  38.8 cm    20 cm Proximal to Floor at Lateral Plantar Foot  31.4 1    10  cm Proximal to Floor at Lateral Malleoli  24.2 cm    Circumference  of ankle/heel  30.5 cm.    5 cm Proximal to 1st MTP Joint  23.9 cm    Across MTP Joint  24.2 cm    Around Proximal Great Toe  10 cm    Other  wound on dorsal foot 3.5x2; lateral wound 1.2 diameter     Left Lower Extremity Lymphedema   20 cm Proximal to Suprapatella  72.5 cm    10 cm Proximal to Suprapatella  61.2 cm    At Midpatella/Popliteal Crease  45.7 cm    30 cm Proximal to Floor at Lateral Plantar Foot  39.2 cm    20 cm Proximal to Floor at Lateral Plantar Foot  31.2 cm    10 cm Proximal to Floor at Lateral Malleoli  23 cm    Circumference of ankle/heel  32 cm.    5 cm Proximal to 1st MTP Joint  24.5 cm    Across MTP Joint  24.5 cm    Other   wound great toe proximal 2x.5; distal .5 cm; dorsal foot 2.x1 cm ;                    [facility] Adult PT Treatment/Exercise - 07/31/17 0001      Self-Care   Self-Care  Other Self-Care Comments    Other Self-Care Comments   self massage for  head and neck 1-8 ; Sheet given       Manual Therapy   Compression Bandaging  B profore with toe wraps honey , xeroform f/b 2x2 to all wounds.    Other Manual Therapy  PT wounds measured. They continue to be clean but have not healed.  Wounds had medihoney followed by xeroform ,2x2 and medipore tape.  Wound sizes as follows: LT dorsal 3.5 x 2 cm; 2nd wound just lateral to this 1.2 cm diameter clean no depth; Rt great toe proximal 2x.5 cm; distal great toe .5 cm ; dorsal foot 2x1 cm all wound no depth scant drainage.                PT Short Term Goals - 07/28/17 1613      PT SHORT TERM GOAL #1   Title  PT woundson B LE  to have stopped weeping to decrease risk of infection     Time  1    Period  Weeks    Status  Achieved      PT SHORT TERM GOAL #2   Title  PT wounds on B LE  to be 100% granulated     Baseline  initial wound are healed but pt developed new wounds which have not totally healed.     Time  2    Period  Weeks    Status  Achieved      PT SHORT TERM GOAL #3   Title  PT volume to be decreased by 1.5 cm to allow pt to lift legs onto the beds with greater ease.     Time  2    Period  Weeks    Status  Achieved        PT Long Term Goals - 07/28/17 1615      PT LONG TERM GOAL #1   Title  Pt volume to be decreased by 3 cm to allow pt to don and doff clothes easier.     Time  6    Period  Weeks    Status  Achieved      PT LONG TERM GOAL #  2   Title  PT wounds to be healed to decrease risk of infection     Baseline  as stated before inital wounds have healed but pt has new wounds which developed the week of 07/23/2017    Time  4    Period  Weeks    Status  Achieved      PT LONG TERM GOAL #3   Title  Pt pain in LE to be no greater than a 3/10 to allow walking to be easier ( pt to be able to walk for five-10 minute without leg pain) . Pt is also limited due to her COPd    Status  Achieved            Plan - 07/31/17 1614    Clinical Impression Statement   PT 30 minutes late for appointment.  Pt volume of LE appears to have stabilized but wounds have not healed.  Due to time restraints pt manual was not completed but compression bandaging including toe wraps and dressing of wounds were accomplished.  Began education on head and neck lymphedema instructing and giving pt handout on 1-8.      Rehab Potential  Good    PT Frequency  3x / week    PT Duration  6 weeks    PT Treatment/Interventions  Patient/family education;Manual techniques;Manual lymph drainage;Compression bandaging    PT Next Visit Plan  Volumes have stabilized but pt requests more visits for head and neck lymphedema.  Continue LE until wounds have healed then send to [facility] to get 20-30 mmHG compression,  Measure LE next visit may discontinue LE manual continue with compression bandaging until wounds are healed.  Measure for head and neck lymphedema continue with instruction to pt on how to self massage for head and neck once I may D/C     Consulted and Agree with Plan of Care  Patient       Patient will benefit from skilled therapeutic intervention in order to improve the following deficits and impairments:  Cardiopulmonary status limiting activity, Decreased activity tolerance, Decreased strength, Difficulty walking, Pain, Increased edema, Other (comment)(open wounds)  Visit Diagnosis: Lymphedema, not elsewhere classified  Open wound of left lower leg with complication, sequela  Open wound of right leg, and ankle with complication, subsequent encounter     Problem List Patient Active Problem List   Diagnosis Date Noted  . PMB (postmenopausal bleeding) 10/06/2016  . Chest pressure 01/05/2015  . Gastric erosion   . Encounter for screening colonoscopy 01/22/2014  . Constipation 01/20/2014  . GERD (gastroesophageal reflux disease) 01/20/2014  . Dyspepsia 01/20/2014  . SVT (supraventricular tachycardia) (HCC) 05/31/2013  . HTN (hypertension), benign 05/31/2013  .  Hypercholesteremia 05/31/2013  . COPD (chronic obstructive pulmonary disease) (HCC) 05/31/2013  . Cervical stenosis of spine 05/31/2013  . Peripheral neuropathy 05/31/2013   Virgina Organ, PT CLT [phone] 07/31/2017, 4:26 PM  [facility] [address] [place], [place], [address] Phone: [phone]   Fax:  [phone]  Name: Gina Bradley MRN: [id number] Date of Birth: 17-Jun-1960

## 2017-08-02 ENCOUNTER — Ambulatory Visit (HOSPITAL_COMMUNITY): Payer: Medicare Other | Attending: Internal Medicine | Admitting: Physical Therapy

## 2017-08-02 ENCOUNTER — Encounter (HOSPITAL_COMMUNITY): Payer: Self-pay | Admitting: Physical Therapy

## 2017-08-02 DIAGNOSIS — S81801D Unspecified open wound, right lower leg, subsequent encounter: Secondary | ICD-10-CM | POA: Diagnosis not present

## 2017-08-02 DIAGNOSIS — R262 Difficulty in walking, not elsewhere classified: Secondary | ICD-10-CM | POA: Diagnosis not present

## 2017-08-02 DIAGNOSIS — I890 Lymphedema, not elsewhere classified: Secondary | ICD-10-CM | POA: Insufficient documentation

## 2017-08-02 DIAGNOSIS — S91001D Unspecified open wound, right ankle, subsequent encounter: Secondary | ICD-10-CM | POA: Diagnosis not present

## 2017-08-02 DIAGNOSIS — K219 Gastro-esophageal reflux disease without esophagitis: Secondary | ICD-10-CM | POA: Diagnosis not present

## 2017-08-02 DIAGNOSIS — S81001D Unspecified open wound, right knee, subsequent encounter: Secondary | ICD-10-CM | POA: Insufficient documentation

## 2017-08-02 DIAGNOSIS — Z6841 Body Mass Index (BMI) 40.0 and over, adult: Secondary | ICD-10-CM | POA: Diagnosis not present

## 2017-08-02 DIAGNOSIS — X58XXXS Exposure to other specified factors, sequela: Secondary | ICD-10-CM | POA: Diagnosis not present

## 2017-08-02 DIAGNOSIS — J449 Chronic obstructive pulmonary disease, unspecified: Secondary | ICD-10-CM | POA: Diagnosis not present

## 2017-08-02 DIAGNOSIS — G894 Chronic pain syndrome: Secondary | ICD-10-CM | POA: Diagnosis not present

## 2017-08-02 DIAGNOSIS — X58XXXD Exposure to other specified factors, subsequent encounter: Secondary | ICD-10-CM | POA: Insufficient documentation

## 2017-08-02 DIAGNOSIS — I10 Essential (primary) hypertension: Secondary | ICD-10-CM | POA: Diagnosis not present

## 2017-08-02 DIAGNOSIS — Z1389 Encounter for screening for other disorder: Secondary | ICD-10-CM | POA: Diagnosis not present

## 2017-08-02 DIAGNOSIS — S81802S Unspecified open wound, left lower leg, sequela: Secondary | ICD-10-CM | POA: Diagnosis not present

## 2017-08-02 NOTE — Therapy (Signed)
[facility] [address] [place], [place], [address] Phone: [phone]   Fax:  [phone]  Physical Therapy Treatment  Patient Details  Name: Gina Bradley MRN: [id number] Date of Birth: 1960/05/03 Referring Provider: Lewayne Bunting    Encounter Date: 08/02/2017  PT End of Session - 08/02/17 1621    Visit Number  13    Number of Visits  18    Date for PT Re-Evaluation  07/31/17 Reassess weekly on Wed.     Authorization Type  cert period 4/74/25-9/56/38; recert to 5/21     Authorization - Visit Number  13    Authorization - Number of Visits  18    PT Start Time  1425    PT Stop Time  1500    PT Time Calculation (min)  35 min    Activity Tolerance  Patient tolerated treatment well    Behavior During Therapy  WFL for tasks assessed/performed       Past Medical History:  Diagnosis Date  . Anemia   . Cervical disc disease   . COPD (chronic obstructive pulmonary disease) (HCC)   . Diabetes mellitus without complication (HCC)   . Essential hypertension   . GERD (gastroesophageal reflux disease)   . Heart failure (HCC) 2018  . History of cardiac catheterization    Normal coronaries March 2015  . History of non-ST elevation myocardial infarction (NSTEMI)    Secondary to SVT  . Peripheral neuropathy   . Spinal stenosis   . SVT (supraventricular tachycardia) (HCC)     Past Surgical History:  Procedure Laterality Date  . CESAREAN SECTION    . ESOPHAGOGASTRODUODENOSCOPY N/A 02/10/2014   VFI:[id number] tiny antral erosion; otherwise normal EGD. No explanation for patient's symptoms. Gallbladder needs further evaluation.  [name] LEFT HEART CATHETERIZATION WITH CORONARY ANGIOGRAM N/A 06/03/2013   Procedure: LEFT HEART CATHETERIZATION WITH CORONARY ANGIOGRAM;  Surgeon: Lesleigh Noe, MD;  Location: [facility] CATH LAB;  Service: Cardiovascular;  Laterality: N/A;    There were no vitals filed for this visit.  Subjective Assessment - 08/02/17 1617     Subjective  Pt 25 minutes late for appointment and states that she needs to leave early for a MD appointment.     How long can you sit comfortably?  no problem    How long can you stand comfortably?  10 minutes     How long can you walk comfortably?  15-20 minutes now    Currently in Pain?  No/denies                       [facility] Adult PT Treatment/Exercise - 08/02/17 0001      Manual Therapy   Compression Bandaging  B profore with toe wraps.      Other Manual Therapy  Wounds covered with xeroform 2x2 and medipore tape prior to profore application                PT Short Term Goals - 07/28/17 1613      PT SHORT TERM GOAL #1   Title  PT woundson B LE  to have stopped weeping to decrease risk of infection     Time  1    Period  Weeks    Status  Achieved      PT SHORT TERM GOAL #2   Title  PT wounds on B LE  to be 100% granulated     Baseline  initial wound are  healed but pt developed new wounds which have not totally healed.     Time  2    Period  Weeks    Status  Achieved      PT SHORT TERM GOAL #3   Title  PT volume to be decreased by 1.5 cm to allow pt to lift legs onto the beds with greater ease.     Time  2    Period  Weeks    Status  Achieved        PT Long Term Goals - 07/28/17 1615      PT LONG TERM GOAL #1   Title  Pt volume to be decreased by 3 cm to allow pt to don and doff clothes easier.     Time  6    Period  Weeks    Status  Achieved      PT LONG TERM GOAL #2   Title  PT wounds to be healed to decrease risk of infection     Baseline  as stated before inital wounds have healed but pt has new wounds which developed the week of 07/23/2017    Time  4    Period  Weeks    Status  Achieved      PT LONG TERM GOAL #3   Title  Pt pain in LE to be no greater than a 3/10 to allow walking to be easier ( pt to be able to walk for five-10 minute without leg pain) . Pt is also limited due to her COPd    Status  Achieved             Plan - 08/02/17 1621    Clinical Impression Statement  Unfortunately due to pt being 25 minutes late and having to leave early there was no time to instruct pt on self massaging for her neck.  She did state that she has been completing the chest massaging and is doing well with no questions.  Wounds are mildly macerated therfore changed to xeroform instead of honey.  Forefoot of LT foot still slightly red and wound are not healing, they are not getting any worse either.  Pt is a diabetic therfore therapist asked pt to speak to MD to see if he wants her on an antibiotic.    Rehab Potential  Good    PT Frequency  3x / week    PT Duration  6 weeks    PT Treatment/Interventions  Patient/family education;Manual techniques;Manual lymph drainage;Compression bandaging    PT Next Visit Plan  See if pt got an antibiotic; begin instruction on neck self massaging to decrease lymph volume.     Consulted and Agree with Plan of Care  Patient       Patient will benefit from skilled therapeutic intervention in order to improve the following deficits and impairments:  Cardiopulmonary status limiting activity, Decreased activity tolerance, Decreased strength, Difficulty walking, Pain, Increased edema, Other (comment)(open wounds)  Visit Diagnosis: Lymphedema, not elsewhere classified  Open wound of left lower leg with complication, sequela  Open wound of right leg, and ankle with complication, subsequent encounter     Problem List Patient Active Problem List   Diagnosis Date Noted  . PMB (postmenopausal bleeding) 10/06/2016  . Chest pressure 01/05/2015  . Gastric erosion   . Encounter for screening colonoscopy 01/22/2014  . Constipation 01/20/2014  . GERD (gastroesophageal reflux disease) 01/20/2014  . Dyspepsia 01/20/2014  . SVT (supraventricular tachycardia) (HCC) 05/31/2013  . HTN (hypertension),  benign 05/31/2013  . Hypercholesteremia 05/31/2013  . COPD (chronic obstructive  pulmonary disease) (HCC) 05/31/2013  . Cervical stenosis of spine 05/31/2013  . Peripheral neuropathy 05/31/2013    Virgina Organ, PT CLT [phone] 08/02/2017, 4:26 PM  [facility] [address] [place], [place], [address] Phone: [phone]   Fax:  [phone]  Name: KEELEIGH TERRIS MRN: [id number] Date of Birth: 1960-04-09

## 2017-08-04 ENCOUNTER — Telehealth (HOSPITAL_COMMUNITY): Payer: Self-pay | Admitting: Physical Therapy

## 2017-08-04 ENCOUNTER — Ambulatory Visit (HOSPITAL_COMMUNITY): Payer: Medicare Other | Admitting: Physical Therapy

## 2017-08-04 NOTE — Telephone Encounter (Signed)
PT called re missed appointment.  Reminded pt that her next appointment is Monday at 2/30 PM.  Virgina Organ, PT CLT [phone]

## 2017-08-06 DIAGNOSIS — G4733 Obstructive sleep apnea (adult) (pediatric): Secondary | ICD-10-CM | POA: Diagnosis not present

## 2017-08-07 ENCOUNTER — Encounter

## 2017-08-07 ENCOUNTER — Ambulatory Visit (HOSPITAL_COMMUNITY): Payer: Medicare Other | Admitting: Physical Therapy

## 2017-08-07 ENCOUNTER — Encounter (HOSPITAL_COMMUNITY): Payer: Self-pay | Admitting: Physical Therapy

## 2017-08-07 DIAGNOSIS — R262 Difficulty in walking, not elsewhere classified: Secondary | ICD-10-CM | POA: Diagnosis not present

## 2017-08-07 DIAGNOSIS — S81001D Unspecified open wound, right knee, subsequent encounter: Secondary | ICD-10-CM | POA: Diagnosis not present

## 2017-08-07 DIAGNOSIS — S91001D Unspecified open wound, right ankle, subsequent encounter: Secondary | ICD-10-CM | POA: Diagnosis not present

## 2017-08-07 DIAGNOSIS — S81801D Unspecified open wound, right lower leg, subsequent encounter: Secondary | ICD-10-CM

## 2017-08-07 DIAGNOSIS — I890 Lymphedema, not elsewhere classified: Secondary | ICD-10-CM | POA: Diagnosis not present

## 2017-08-07 DIAGNOSIS — S81802S Unspecified open wound, left lower leg, sequela: Secondary | ICD-10-CM | POA: Diagnosis not present

## 2017-08-07 NOTE — Therapy (Signed)
[facility] [address] [place], [place], [address] Phone: [phone]   Fax:  [phone]  Physical Therapy Treatment  Patient Details  Name: Gina Bradley MRN: [id number] Date of Birth: 09/12/1960 Referring Provider: Lewayne Bunting    Encounter Date: 08/07/2017  PT End of Session - 08/07/17 1610    Visit Number  14    Number of Visits  18    Date for PT Re-Evaluation  07/31/17 Reassess weekly on Wed.     Authorization Type  cert period 4/40/34-7/42/59; recert to 5/21     Authorization - Visit Number  14    Authorization - Number of Visits  18    PT Start Time  1445    PT Stop Time  1600    PT Time Calculation (min)  75 min    Activity Tolerance  Patient tolerated treatment well    Behavior During Therapy  WFL for tasks assessed/performed       Past Medical History:  Diagnosis Date  . Anemia   . Cervical disc disease   . COPD (chronic obstructive pulmonary disease) (HCC)   . Diabetes mellitus without complication (HCC)   . Essential hypertension   . GERD (gastroesophageal reflux disease)   . Heart failure (HCC) 2018  . History of cardiac catheterization    Normal coronaries March 2015  . History of non-ST elevation myocardial infarction (NSTEMI)    Secondary to SVT  . Peripheral neuropathy   . Spinal stenosis   . SVT (supraventricular tachycardia) (HCC)     Past Surgical History:  Procedure Laterality Date  . CESAREAN SECTION    . ESOPHAGOGASTRODUODENOSCOPY N/A 02/10/2014   DGL:OVFIEP tiny antral erosion; otherwise normal EGD. No explanation for patient's symptoms. Gallbladder needs further evaluation.  [name] LEFT HEART CATHETERIZATION WITH CORONARY ANGIOGRAM N/A 06/03/2013   Procedure: LEFT HEART CATHETERIZATION WITH CORONARY ANGIOGRAM;  Surgeon: Lesleigh Noe, MD;  Location: [facility] CATH LAB;  Service: Cardiovascular;  Laterality: N/A;    There were no vitals filed for this visit.  Subjective Assessment - 08/07/17 1606     Subjective  PT has not been to appointment since 08/02/2017 stated that she did not think that she had an appointment on the third.     How long can you sit comfortably?  no problem    How long can you stand comfortably?  10 minutes     How long can you walk comfortably?  15-20 minutes now    Currently in Pain?  No/denies                       [facility] Adult PT Treatment/Exercise - 08/07/17 0001      Manual Therapy   Manual Therapy  Manual Lymphatic Drainage (MLD)    Manual therapy comments  done seperate from all other aspects of treatment.    Manual Lymphatic Drainage (MLD)  to include supraclavicular, deep and superfical abdominal; B inguinal/axillary anastomosis and LE B.  Manual done anteriorly only due to time constraints.  Begain chest and neck manual with education to pt.    Compression Bandaging  B profore with toe wraps.      Other Manual Therapy  Wounds covered with xeroform 2x2 and medipore tape prior to profore application              PT Education - 08/07/17 1609    Education provided  Yes    Education Details  PT had  been instructed in chest part of head/neck and chest manual decongestive technique last week.  Added neck today.     Person(s) Educated  Patient    Methods  Explanation;Handout;Demonstration    Comprehension  Returned demonstration;Need further instruction       PT Short Term Goals - 07/28/17 1613      PT SHORT TERM GOAL #1   Title  PT woundson B LE  to have stopped weeping to decrease risk of infection     Time  1    Period  Weeks    Status  Achieved      PT SHORT TERM GOAL #2   Title  PT wounds on B LE  to be 100% granulated     Baseline  initial wound are healed but pt developed new wounds which have not totally healed.     Time  2    Period  Weeks    Status  Achieved      PT SHORT TERM GOAL #3   Title  PT volume to be decreased by 1.5 cm to allow pt to lift legs onto the beds with greater ease.     Time  2    Period   Weeks    Status  Achieved        PT Long Term Goals - 07/28/17 1615      PT LONG TERM GOAL #1   Title  Pt volume to be decreased by 3 cm to allow pt to don and doff clothes easier.     Time  6    Period  Weeks    Status  Achieved      PT LONG TERM GOAL #2   Title  PT wounds to be healed to decrease risk of infection     Baseline  as stated before inital wounds have healed but pt has new wounds which developed the week of 07/23/2017    Time  4    Period  Weeks    Status  Achieved      PT LONG TERM GOAL #3   Title  Pt pain in LE to be no greater than a 3/10 to allow walking to be easier ( pt to be able to walk for five-10 minute without leg pain) . Pt is also limited due to her COPd    Status  Achieved            Plan - 08/07/17 1610    Clinical Impression Statement  Pt comes to departement with toe wraps unraveled and profore sliding down.  Upon removal pt has segmental swelling and areas of bruising where the profore constricted.  Therapist emphasized the importance of taking the bandaging off if she misses an appointment and they begin to slide down her leg.  Wounds are not healing well despite the fact that pt obtained an order of antibiotics.  Therapist asked pt what her blood sugars have been pt verbalized that she  has not taken her sugar for at least a month.      Rehab Potential  Good    PT Frequency  3x / week    PT Duration  6 weeks    PT Treatment/Interventions  Patient/family education;Manual techniques;Manual lymph drainage;Compression bandaging    PT Next Visit Plan  Progress instruction on chest/ neck self massaging to include face as well to decrease lymph volume.     Consulted and Agree with Plan of Care  Patient  Patient will benefit from skilled therapeutic intervention in order to improve the following deficits and impairments:  Cardiopulmonary status limiting activity, Decreased activity tolerance, Decreased strength, Difficulty walking, Pain,  Increased edema, Other (comment)(open wounds)  Visit Diagnosis: Lymphedema, not elsewhere classified  Open wound of left lower leg with complication, sequela  Open wound of right leg, and ankle with complication, subsequent encounter  Difficulty in walking, not elsewhere classified     Problem List Patient Active Problem List   Diagnosis Date Noted  . PMB (postmenopausal bleeding) 10/06/2016  . Chest pressure 01/05/2015  . Gastric erosion   . Encounter for screening colonoscopy 01/22/2014  . Constipation 01/20/2014  . GERD (gastroesophageal reflux disease) 01/20/2014  . Dyspepsia 01/20/2014  . SVT (supraventricular tachycardia) (HCC) 05/31/2013  . HTN (hypertension), benign 05/31/2013  . Hypercholesteremia 05/31/2013  . COPD (chronic obstructive pulmonary disease) (HCC) 05/31/2013  . Cervical stenosis of spine 05/31/2013  . Peripheral neuropathy 05/31/2013    Virgina Organ, PT CLT [phone] 08/07/2017, 4:15 PM  [facility] [address] [place], [place], [address] Phone: [phone]   Fax:  [phone]  Name: Gina Bradley MRN: [id number] Date of Birth: 07-20-1960

## 2017-08-09 ENCOUNTER — Ambulatory Visit (HOSPITAL_COMMUNITY): Payer: Medicare Other | Admitting: Physical Therapy

## 2017-08-09 ENCOUNTER — Encounter (HOSPITAL_COMMUNITY): Payer: Self-pay | Admitting: Physical Therapy

## 2017-08-09 DIAGNOSIS — S81001D Unspecified open wound, right knee, subsequent encounter: Secondary | ICD-10-CM | POA: Diagnosis not present

## 2017-08-09 DIAGNOSIS — S81801D Unspecified open wound, right lower leg, subsequent encounter: Secondary | ICD-10-CM

## 2017-08-09 DIAGNOSIS — I890 Lymphedema, not elsewhere classified: Secondary | ICD-10-CM

## 2017-08-09 DIAGNOSIS — S81802S Unspecified open wound, left lower leg, sequela: Secondary | ICD-10-CM | POA: Diagnosis not present

## 2017-08-09 DIAGNOSIS — S91001D Unspecified open wound, right ankle, subsequent encounter: Secondary | ICD-10-CM | POA: Diagnosis not present

## 2017-08-09 DIAGNOSIS — R262 Difficulty in walking, not elsewhere classified: Secondary | ICD-10-CM | POA: Diagnosis not present

## 2017-08-09 NOTE — Therapy (Signed)
[facility] [address] [place], [place], [address] Phone: [phone]   Fax:  [phone]  Physical Therapy Treatment  Patient Details  Name: Gina Bradley MRN: [id number] Date of Birth: [birth year] Referring Provider: Lewayne Bunting    Encounter Date: 08/09/2017  PT End of Session - 08/09/17 1603    Visit Number  15    Number of Visits  18    Date for PT Re-Evaluation  07/31/17 Reassess weekly on Wed.     Authorization Type  cert period 4/74/25-9/56/38; recert to 5/21     Authorization - Visit Number  15    Authorization - Number of Visits  18    PT Start Time  1445    PT Stop Time  1600    PT Time Calculation (min)  75 min    Activity Tolerance  Patient tolerated treatment well    Behavior During Therapy  WFL for tasks assessed/performed       Past Medical History:  Diagnosis Date  . Anemia   . Cervical disc disease   . COPD (chronic obstructive pulmonary disease) (HCC)   . Diabetes mellitus without complication (HCC)   . Essential hypertension   . GERD (gastroesophageal reflux disease)   . Heart failure (HCC) 2018  . History of cardiac catheterization    Normal coronaries March 2015  . History of non-ST elevation myocardial infarction (NSTEMI)    Secondary to SVT  . Peripheral neuropathy   . Spinal stenosis   . SVT (supraventricular tachycardia) (HCC)     Past Surgical History:  Procedure Laterality Date  . CESAREAN SECTION    . ESOPHAGOGASTRODUODENOSCOPY N/A 02/10/2014   VFI:EPPIRJ tiny antral erosion; otherwise normal EGD. No explanation for patient's symptoms. Gallbladder needs further evaluation.  [name] LEFT HEART CATHETERIZATION WITH CORONARY ANGIOGRAM N/A 06/03/2013   Procedure: LEFT HEART CATHETERIZATION WITH CORONARY ANGIOGRAM;  Surgeon: Lesleigh Noe, MD;  Location: [facility] CATH LAB;  Service: Cardiovascular;  Laterality: N/A;    There were no vitals filed for this visit.  Subjective Assessment - 08/09/17 1600     Subjective  Pt states that she has been working on her head and neck manual techniques. ( unfortunately pt is completing incorrectly)>    How long can you sit comfortably?  no problem    How long can you stand comfortably?  10 minutes     How long can you walk comfortably?  15-20 minutes now    Currently in Pain?  No/denies                       [facility] Adult PT Treatment/Exercise - 08/09/17 0001      Manual Therapy   Manual Therapy  Manual Lymphatic Drainage (MLD)    Manual therapy comments  done seperate from all other aspects of treatment.    Manual Lymphatic Drainage (MLD)  to include supraclavicular, deep and superfical abdominal; B inguinal/axillary anastomosis and LE B.  Manual  on LE done anteriorly only due to time constraints.  Reviewed head and neck manual repeatedly;( pt continues to want to push fluid in face down instead of towards ear.)   Compression Bandaging  B profore with toe wraps.      Other Manual Therapy  Wounds covered with xeroform 2x2 and medipore tape prior to profore application                PT Short Term Goals - 07/28/17 1884  PT SHORT TERM GOAL #1   Title  PT woundson B LE  to have stopped weeping to decrease risk of infection     Time  1    Period  Weeks    Status  Achieved      PT SHORT TERM GOAL #2   Title  PT wounds on B LE  to be 100% granulated     Baseline  initial wound are healed but pt developed new wounds which have not totally healed.     Time  2    Period  Weeks    Status  Achieved      PT SHORT TERM GOAL #3   Title  PT volume to be decreased by 1.5 cm to allow pt to lift legs onto the beds with greater ease.     Time  2    Period  Weeks    Status  Achieved        PT Long Term Goals - 07/28/17 1615      PT LONG TERM GOAL #1   Title  Pt volume to be decreased by 3 cm to allow pt to don and doff clothes easier.     Time  6    Period  Weeks    Status  Achieved      PT LONG TERM GOAL #2   Title   PT wounds to be healed to decrease risk of infection     Baseline  as stated before inital wounds have healed but pt has new wounds which developed the week of 07/23/2017    Time  4    Period  Weeks    Status  Achieved      PT LONG TERM GOAL #3   Title  Pt pain in LE to be no greater than a 3/10 to allow walking to be easier ( pt to be able to walk for five-10 minute without leg pain) . Pt is also limited due to her COPd    Status  Achieved            Plan - 08/09/17 1603    Clinical Impression Statement  PT brings her butler and new compression stockings in.  Therapist measured pt as pt was given extra large petite.  Per therapist measurement pt should be a large petite.  Pt states that she had purchased two pairs.  She has not worn this pair but the other pair kept falling down and that is why her leg got so big.  Therapist urged pt to take the pair that has not been worn back and exchange for the proper size.  Therapist reviewed proper technique for head and neck lymphedema multiple times with pt.     Rehab Potential  Good    PT Frequency  3x / week    PT Duration  6 weeks    PT Treatment/Interventions  Patient/family education;Manual techniques;Manual lymph drainage;Compression bandaging    PT Next Visit Plan  Remeasure wounds and volume, go over head and neck self manual techniques.  Begin wearing compression garment once wound are healed.     Consulted and Agree with Plan of Care  Patient       Patient will benefit from skilled therapeutic intervention in order to improve the following deficits and impairments:  Cardiopulmonary status limiting activity, Decreased activity tolerance, Decreased strength, Difficulty walking, Pain, Increased edema, Other (comment)(open wounds)  Visit Diagnosis: Lymphedema, not elsewhere classified  Open wound of left lower leg with  complication, sequela  Open wound of right leg, and ankle with complication, subsequent encounter     Problem  List Patient Active Problem List   Diagnosis Date Noted  . PMB (postmenopausal bleeding) 10/06/2016  . Chest pressure 01/05/2015  . Gastric erosion   . Encounter for screening colonoscopy 01/22/2014  . Constipation 01/20/2014  . GERD (gastroesophageal reflux disease) 01/20/2014  . Dyspepsia 01/20/2014  . SVT (supraventricular tachycardia) (HCC) 05/31/2013  . HTN (hypertension), benign 05/31/2013  . Hypercholesteremia 05/31/2013  . COPD (chronic obstructive pulmonary disease) (HCC) 05/31/2013  . Cervical stenosis of spine 05/31/2013  . Peripheral neuropathy 05/31/2013    Virgina Organ, PT CLT [phone] 08/09/2017, 4:08 PM  [facility] [address] [place], [place], [address] Phone: [phone]   Fax:  [phone]  Name: Gina Bradley MRN: [id number] Date of Birth: 01-Dec-1960

## 2017-08-11 ENCOUNTER — Ambulatory Visit (HOSPITAL_COMMUNITY): Payer: Medicare Other | Admitting: Physical Therapy

## 2017-08-11 ENCOUNTER — Telehealth (HOSPITAL_COMMUNITY): Payer: Self-pay | Admitting: Internal Medicine

## 2017-08-11 DIAGNOSIS — S81801D Unspecified open wound, right lower leg, subsequent encounter: Secondary | ICD-10-CM | POA: Diagnosis not present

## 2017-08-11 DIAGNOSIS — S81802S Unspecified open wound, left lower leg, sequela: Secondary | ICD-10-CM

## 2017-08-11 DIAGNOSIS — I890 Lymphedema, not elsewhere classified: Secondary | ICD-10-CM | POA: Diagnosis not present

## 2017-08-11 DIAGNOSIS — S91001D Unspecified open wound, right ankle, subsequent encounter: Secondary | ICD-10-CM | POA: Diagnosis not present

## 2017-08-11 DIAGNOSIS — S81001D Unspecified open wound, right knee, subsequent encounter: Secondary | ICD-10-CM | POA: Diagnosis not present

## 2017-08-11 DIAGNOSIS — R262 Difficulty in walking, not elsewhere classified: Secondary | ICD-10-CM | POA: Diagnosis not present

## 2017-08-11 NOTE — Telephone Encounter (Signed)
5/10  10:27am left a message asking patient if she could come in at 1:00 to get full 90 mins. session.  MM

## 2017-08-11 NOTE — Therapy (Signed)
[facility] [address] [place], [place], [address] Phone: [phone]   Fax:  [phone]  Physical Therapy Treatment  Patient Details  Name: Gina Bradley MRN: [id number] Date of Birth: 06-Jul-1960 Referring Provider: Lewayne Bunting    Encounter Date: 08/11/2017  PT End of Session - 08/11/17 1454    Visit Number  16    Number of Visits  21    Date for PT Re-Evaluation  07/31/17 Reassess weekly on Wed.     Authorization Type  cert period 6/96/29-08/30/39; recert to 5/21     Authorization - Visit Number  16    Authorization - Number of Visits  21    PT Start Time  1320    PT Stop Time  1430    PT Time Calculation (min)  70 min    Activity Tolerance  Patient tolerated treatment well    Behavior During Therapy  WFL for tasks assessed/performed       Past Medical History:  Diagnosis Date  . Anemia   . Cervical disc disease   . COPD (chronic obstructive pulmonary disease) (HCC)   . Diabetes mellitus without complication (HCC)   . Essential hypertension   . GERD (gastroesophageal reflux disease)   . Heart failure (HCC) 2018  . History of cardiac catheterization    Normal coronaries March 2015  . History of non-ST elevation myocardial infarction (NSTEMI)    Secondary to SVT  . Peripheral neuropathy   . Spinal stenosis   . SVT (supraventricular tachycardia) (HCC)     Past Surgical History:  Procedure Laterality Date  . CESAREAN SECTION    . ESOPHAGOGASTRODUODENOSCOPY N/A 02/10/2014   LKG:MWNUUV tiny antral erosion; otherwise normal EGD. No explanation for patient's symptoms. Gallbladder needs further evaluation.  [name] LEFT HEART CATHETERIZATION WITH CORONARY ANGIOGRAM N/A 06/03/2013   Procedure: LEFT HEART CATHETERIZATION WITH CORONARY ANGIOGRAM;  Surgeon: Lesleigh Noe, MD;  Location: [facility] CATH LAB;  Service: Cardiovascular;  Laterality: N/A;    There were no vitals filed for this visit.  Subjective Assessment - 08/11/17 1328     Subjective  Pt states that her Lt foot has been giving her some pain off and on for some reason .  States that it has been difficult getting in both antibiotics per day due to stomach issues.     How long can you sit comfortably?  no problem    How long can you stand comfortably?  10 minutes     How long can you walk comfortably?  15-20 minutes now    Currently in Pain?  Yes    Pain Score  8     Pain Location  Foot    Pain Orientation  Left    Pain Descriptors / Indicators  Aching            LYMPHEDEMA/ONCOLOGY QUESTIONNAIRE - 08/11/17 1329      Right Lower Extremity Lymphedema   20 cm Proximal to Suprapatella  69.5 cm    10 cm Proximal to Suprapatella  59.3 cm    At Midpatella/Popliteal Crease  45.7 cm    30 cm Proximal to Floor at Lateral Plantar Foot  39 cm    20 cm Proximal to Floor at Lateral Plantar Foot  30.5 1    10  cm Proximal to Floor at Lateral Malleoli  22.7 cm    Circumference of ankle/heel  30.8 cm.    5 cm Proximal to 1st MTP Joint  24  cm    Across MTP Joint  23 cm    Around Proximal Great Toe  8.5 cm    Other  wounds are healed      Left Lower Extremity Lymphedema   20 cm Proximal to Suprapatella  73 cm    10 cm Proximal to Suprapatella  62.7 cm    At Midpatella/Popliteal Crease  48 cm    30 cm Proximal to Floor at Lateral Plantar Foot  40 cm    20 cm Proximal to Floor at Lateral Plantar Foot  32 cm    10 cm Proximal to Floor at Lateral Malleoli  23 cm    Circumference of ankle/heel  31 cm.    5 cm Proximal to 1st MTP Joint  24 cm    Across MTP Joint  23.7 cm    Other  wound on dorsal foot 2x 1 cm no change; wound on great toe 2x.5 no change    Other  forefoot is red                 [facility] Adult PT Treatment/Exercise - 08/11/17 0001      Manual Therapy   Manual Therapy  Manual Lymphatic Drainage (MLD)    Manual therapy comments  done seperate from all other aspects of treatment.    Manual Lymphatic Drainage (MLD)  to include  supraclavicular, deep and superfical abdominal; B inguinal/axillary anastomosis and LE B.  Manual  on LE done anteriorly only due to time constraints.  Reviewed head and neck manual briefly ;( pt continues to want to push fluid in face down instead of towards ear.)tion to pt.    Compression Bandaging  B profore with toe wraps.      Other Manual Therapy  Wounds covered with xeroformx prior to profore application                PT Short Term Goals - 07/28/17 1613      PT SHORT TERM GOAL #1   Title  PT woundson B LE  to have stopped weeping to decrease risk of infection     Time  1    Period  Weeks    Status  Achieved      PT SHORT TERM GOAL #2   Title  PT wounds on B LE  to be 100% granulated     Baseline  initial wound are healed but pt developed new wounds which have not totally healed.     Time  2    Period  Weeks    Status  Achieved      PT SHORT TERM GOAL #3   Title  PT volume to be decreased by 1.5 cm to allow pt to lift legs onto the beds with greater ease.     Time  2    Period  Weeks    Status  Achieved        PT Long Term Goals - 07/28/17 1615      PT LONG TERM GOAL #1   Title  Pt volume to be decreased by 3 cm to allow pt to don and doff clothes easier.     Time  6    Period  Weeks    Status  Achieved      PT LONG TERM GOAL #2   Title  PT wounds to be healed to decrease risk of infection     Baseline  as stated before inital wounds have healed but pt has  new wounds which developed the week of 07/23/2017    Time  4    Period  Weeks    Status  Achieved      PT LONG TERM GOAL #3   Title  Pt pain in LE to be no greater than a 3/10 to allow walking to be easier ( pt to be able to walk for five-10 minute without leg pain) . Pt is also limited due to her COPd    Status  Achieved            Plan - 08/11/17 1455    Clinical Impression Statement  Pt urged to eat well and take antibiotic as prescibed.  Rt wounds have healed however Lt wounds have not  approximated at all and forefoot is now red again.  Pt LE measurements show decrease in volumes from knee down but thighs have increased. Emphasized to pt the importance of completing manual techniques to thigh area .  Briefly went over neck and head manual techniques due to time constraints.  Pt will need more  treatments to ensure that she is I in self massaging and using the compression pump.  Pt states that she is going to purchase new compression garments this weekend.     Rehab Potential  Good    PT Frequency  3x / week    PT Duration  6 weeks    PT Treatment/Interventions  Patient/family education;Manual techniques;Manual lymph drainage;Compression bandaging    PT Next Visit Plan  Continue to educate in self manual techniques for head and neck.    Begin wearing compression garment once wound are healed.     Consulted and Agree with Plan of Care  Patient       Patient will benefit from skilled therapeutic intervention in order to improve the following deficits and impairments:  Cardiopulmonary status limiting activity, Decreased activity tolerance, Decreased strength, Difficulty walking, Pain, Increased edema, Other (comment)(open wounds)  Visit Diagnosis: Lymphedema, not elsewhere classified  Open wound of left lower leg with complication, sequela     Problem List Patient Active Problem List   Diagnosis Date Noted  . PMB (postmenopausal bleeding) 10/06/2016  . Chest pressure 01/05/2015  . Gastric erosion   . Encounter for screening colonoscopy 01/22/2014  . Constipation 01/20/2014  . GERD (gastroesophageal reflux disease) 01/20/2014  . Dyspepsia 01/20/2014  . SVT (supraventricular tachycardia) (HCC) 05/31/2013  . HTN (hypertension), benign 05/31/2013  . Hypercholesteremia 05/31/2013  . COPD (chronic obstructive pulmonary disease) (HCC) 05/31/2013  . Cervical stenosis of spine 05/31/2013  . Peripheral neuropathy 05/31/2013    Virgina Organ, PT  CLT [phone] 08/11/2017, 3:00 PM  [facility] [address] [place], [place], [address] Phone: [phone]   Fax:  [phone]  Name: Gina Bradley MRN: [id number] Date of Birth: February 24, 1961

## 2017-08-14 ENCOUNTER — Encounter (HOSPITAL_COMMUNITY): Payer: Self-pay | Admitting: Physical Therapy

## 2017-08-14 ENCOUNTER — Ambulatory Visit (HOSPITAL_COMMUNITY): Payer: Medicare Other | Admitting: Physical Therapy

## 2017-08-14 DIAGNOSIS — S81802S Unspecified open wound, left lower leg, sequela: Secondary | ICD-10-CM | POA: Diagnosis not present

## 2017-08-14 DIAGNOSIS — I890 Lymphedema, not elsewhere classified: Secondary | ICD-10-CM

## 2017-08-14 DIAGNOSIS — S81801D Unspecified open wound, right lower leg, subsequent encounter: Secondary | ICD-10-CM | POA: Diagnosis not present

## 2017-08-14 DIAGNOSIS — S91001D Unspecified open wound, right ankle, subsequent encounter: Secondary | ICD-10-CM | POA: Diagnosis not present

## 2017-08-14 DIAGNOSIS — R262 Difficulty in walking, not elsewhere classified: Secondary | ICD-10-CM | POA: Diagnosis not present

## 2017-08-14 DIAGNOSIS — S81001D Unspecified open wound, right knee, subsequent encounter: Secondary | ICD-10-CM | POA: Diagnosis not present

## 2017-08-14 NOTE — Therapy (Signed)
[facility] [address] [name], [place], [address] Phone: [phone]   Fax:  [phone]  Physical Therapy Treatment  Patient Details  Name: Gina Bradley MRN: [id number] Date of Birth: 05-29-1960 Referring Provider: Lewayne Bunting    Encounter Date: 08/14/2017  PT End of Session - 08/14/17 1609    Visit Number  17    Number of Visits  21    Date for PT Re-Evaluation  07/31/17 Reassess weekly on Wed.     Authorization Type  cert period 5/63/87-5/64/33; recert to 5/21     Authorization - Visit Number  17    Authorization - Number of Visits  21    PT Start Time  1445 Pt late for appointment     PT Stop Time  1600    PT Time Calculation (min)  75 min    Activity Tolerance  Patient tolerated treatment well    Behavior During Therapy  [facility] for tasks assessed/performed       Past Medical History:  Diagnosis Date  . Anemia   . Cervical disc disease   . COPD (chronic obstructive pulmonary disease) (HCC)   . Diabetes mellitus without complication (HCC)   . Essential hypertension   . GERD (gastroesophageal reflux disease)   . Heart failure (HCC) 2018  . History of cardiac catheterization    Normal coronaries March 2015  . History of non-ST elevation myocardial infarction (NSTEMI)    Secondary to SVT  . Peripheral neuropathy   . Spinal stenosis   . SVT (supraventricular tachycardia) (HCC)     Past Surgical History:  Procedure Laterality Date  . CESAREAN SECTION    . ESOPHAGOGASTRODUODENOSCOPY N/A 02/10/2014   IRJ:JOACZY tiny antral erosion; otherwise normal EGD. No explanation for patient's symptoms. Gallbladder needs further evaluation.  [name] LEFT HEART CATHETERIZATION WITH CORONARY ANGIOGRAM N/A 06/03/2013   Procedure: LEFT HEART CATHETERIZATION WITH CORONARY ANGIOGRAM;  Surgeon: Lesleigh Noe, MD;  Location: [facility] CATH LAB;  Service: Cardiovascular;  Laterality: N/A;    There were no vitals filed for this visit.  Subjective  Assessment - 08/14/17 1606    Subjective  Pt states that her foot feels better.  She started taking her antibiotics as prescribed.     How long can you sit comfortably?  no problem    How long can you stand comfortably?  10 minutes     How long can you walk comfortably?  15-20 minutes now    Currently in Pain?  No/denies                       [facility] Adult PT Treatment/Exercise - 08/14/17 0001      Manual Therapy   Manual Therapy  Manual Lymphatic Drainage (MLD)    Manual therapy comments  done seperate from all other aspects of treatment.    Manual Lymphatic Drainage (MLD)  Head and neck to include facial followed by  supraclavicular, deep and superfical abdominal; B inguinal/axillary anastomosis and LE B.  Manual  on LE done anteriorland posterior with posterior on stomach.ewed head and neck manual briefly ;( pt continues to want to push fluid in face down instead of towards ear.)tion to pt.    Compression Bandaging  Therapist made compression for neck and chin to wear prior to completing manual for head and necki.  B profore with toe wraps.      Other Manual Therapy  Wounds covered with xeroformx prior to profore  application                PT Short Term Goals - 07/28/17 1613      PT SHORT TERM GOAL #1   Title  PT woundson B LE  to have stopped weeping to decrease risk of infection     Time  1    Period  Weeks    Status  Achieved      PT SHORT TERM GOAL #2   Title  PT wounds on B LE  to be 100% granulated     Baseline  initial wound are healed but pt developed new wounds which have not totally healed.     Time  2    Period  Weeks    Status  Achieved      PT SHORT TERM GOAL #3   Title  PT volume to be decreased by 1.5 cm to allow pt to lift legs onto the beds with greater ease.     Time  2    Period  Weeks    Status  Achieved        PT Long Term Goals - 07/28/17 1615      PT LONG TERM GOAL #1   Title  Pt volume to be decreased by 3 cm to allow pt  to don and doff clothes easier.     Time  6    Period  Weeks    Status  Achieved      PT LONG TERM GOAL #2   Title  PT wounds to be healed to decrease risk of infection     Baseline  as stated before inital wounds have healed but pt has new wounds which developed the week of 07/23/2017    Time  4    Period  Weeks    Status  Achieved      PT LONG TERM GOAL #3   Title  Pt pain in LE to be no greater than a 3/10 to allow walking to be easier ( pt to be able to walk for five-10 minute without leg pain) . Pt is also limited due to her COPd    Status  Achieved            Plan - 08/14/17 1609    Clinical Impression Statement  Redness on Lt forefoot has decreased considerably.  Wounds appear to have approximated.  PT has not acquired compression garments that are the right size yet; urged to do so     Rehab Potential  Good    PT Frequency  3x / week    PT Duration  6 weeks    PT Treatment/Interventions  Patient/family education;Manual techniques;Manual lymph drainage;Compression bandaging    PT Next Visit Plan  Continue to educate in self manual techniques for head and neck.    Begin wearing compression garment once wound are healed which might be this week.  Measure volume and wounds on Wednesday.     Consulted and Agree with Plan of Care  Patient       Patient will benefit from skilled therapeutic intervention in order to improve the following deficits and impairments:  Cardiopulmonary status limiting activity, Decreased activity tolerance, Decreased strength, Difficulty walking, Pain, Increased edema, Other (comment)(open wounds)  Visit Diagnosis: Lymphedema, not elsewhere classified  Open wound of left lower leg with complication, sequela     Problem List Patient Active Problem List   Diagnosis Date Noted  . PMB (postmenopausal bleeding) 10/06/2016  .  Chest pressure 01/05/2015  . Gastric erosion   . Encounter for screening colonoscopy 01/22/2014  . Constipation  01/20/2014  . GERD (gastroesophageal reflux disease) 01/20/2014  . Dyspepsia 01/20/2014  . SVT (supraventricular tachycardia) (HCC) 05/31/2013  . HTN (hypertension), benign 05/31/2013  . Hypercholesteremia 05/31/2013  . COPD (chronic obstructive pulmonary disease) (HCC) 05/31/2013  . Cervical stenosis of spine 05/31/2013  . Peripheral neuropathy 05/31/2013    Virgina Organ, PT CLT [phone] 08/14/2017, 4:12 PM  [facility] [address] [place], [place], [address] Phone: [phone]   Fax:  [phone]  Name: Gina Bradley MRN: [id number] Date of Birth: [birth year]

## 2017-08-16 ENCOUNTER — Encounter (HOSPITAL_COMMUNITY): Payer: Self-pay | Admitting: Physical Therapy

## 2017-08-16 ENCOUNTER — Ambulatory Visit (HOSPITAL_COMMUNITY): Payer: Medicare Other | Admitting: Physical Therapy

## 2017-08-16 DIAGNOSIS — S81001D Unspecified open wound, right knee, subsequent encounter: Secondary | ICD-10-CM | POA: Diagnosis not present

## 2017-08-16 DIAGNOSIS — R262 Difficulty in walking, not elsewhere classified: Secondary | ICD-10-CM | POA: Diagnosis not present

## 2017-08-16 DIAGNOSIS — S81801D Unspecified open wound, right lower leg, subsequent encounter: Secondary | ICD-10-CM | POA: Diagnosis not present

## 2017-08-16 DIAGNOSIS — S91001D Unspecified open wound, right ankle, subsequent encounter: Secondary | ICD-10-CM | POA: Diagnosis not present

## 2017-08-16 DIAGNOSIS — S81802S Unspecified open wound, left lower leg, sequela: Secondary | ICD-10-CM

## 2017-08-16 DIAGNOSIS — I890 Lymphedema, not elsewhere classified: Secondary | ICD-10-CM | POA: Diagnosis not present

## 2017-08-16 NOTE — Therapy (Signed)
[facility] [address] [place], [place], [address] Phone: [phone]   Fax:  [phone]  Physical Therapy Treatment  Patient Details  Name: Gina Bradley MRN: [id number] Date of Birth: 1960/04/14 Referring Provider: Lewayne Bunting    Encounter Date: 08/16/2017  PT End of Session - 08/16/17 1619    Visit Number  18    Number of Visits  21    Date for PT Re-Evaluation  07/31/17 Reassess weekly on Wed.     Authorization Type  cert period 6/96/29-08/30/39; recert to 5/21 visit limit 30    Authorization - Visit Number  17    Authorization - Number of Visits  21    PT Start Time  1445    PT Stop Time  1600    PT Time Calculation (min)  75 min    Activity Tolerance  Patient tolerated treatment well    Behavior During Therapy  WFL for tasks assessed/performed       Past Medical History:  Diagnosis Date  . Anemia   . Cervical disc disease   . COPD (chronic obstructive pulmonary disease) (HCC)   . Diabetes mellitus without complication (HCC)   . Essential hypertension   . GERD (gastroesophageal reflux disease)   . Heart failure (HCC) 2018  . History of cardiac catheterization    Normal coronaries March 2015  . History of non-ST elevation myocardial infarction (NSTEMI)    Secondary to SVT  . Peripheral neuropathy   . Spinal stenosis   . SVT (supraventricular tachycardia) (HCC)     Past Surgical History:  Procedure Laterality Date  . CESAREAN SECTION    . ESOPHAGOGASTRODUODENOSCOPY N/A 02/10/2014   LKG:MWNUUV tiny antral erosion; otherwise normal EGD. No explanation for patient's symptoms. Gallbladder needs further evaluation.  [name] LEFT HEART CATHETERIZATION WITH CORONARY ANGIOGRAM N/A 06/03/2013   Procedure: LEFT HEART CATHETERIZATION WITH CORONARY ANGIOGRAM;  Surgeon: Lesleigh Noe, MD;  Location: [facility] CATH LAB;  Service: Cardiovascular;  Laterality: N/A;    There were no vitals filed for this visit.  Subjective Assessment -  08/16/17 1446    Subjective  PT states that she lost the measurements that the therapist took therefore she did not get her stockings.     How long can you sit comfortably?  no problem    How long can you stand comfortably?  10 minutes     How long can you walk comfortably?  15-20 minutes now    Pain Score  5     Pain Location  Foot    Pain Orientation  Left    Pain Descriptors / Indicators  Aching    Pain Type  Acute pain    Pain Onset  1 to 4 weeks ago    Pain Frequency  Intermittent            LYMPHEDEMA/ONCOLOGY QUESTIONNAIRE - 08/16/17 1448      Right Lower Extremity Lymphedema   20 cm Proximal to Suprapatella  70 cm    10 cm Proximal to Suprapatella  62 cm    At Midpatella/Popliteal Crease  46 cm    30 cm Proximal to Floor at Lateral Plantar Foot  39 cm    20 cm Proximal to Floor at Lateral Plantar Foot  32 1    10 cm Proximal to Floor at Lateral Malleoli  22.5 cm    Circumference of ankle/heel  30.2 cm.    5 cm Proximal to 1st MTP Joint  23.8 cm    Across MTP Joint  24 cm    Around Proximal Great Toe  8.3 cm      Left Lower Extremity Lymphedema   20 cm Proximal to Suprapatella  72 cm    10 cm Proximal to Suprapatella  62.7 cm    At Midpatella/Popliteal Crease  47.5 cm    30 cm Proximal to Floor at Lateral Plantar Foot  39.8 cm    20 cm Proximal to Floor at Lateral Plantar Foot  32.8 cm    10 cm Proximal to Floor at Lateral Malleoli  22.2 cm    Circumference of ankle/heel  29.7 cm.    5 cm Proximal to 1st MTP Joint  23 cm    Across MTP Joint  24 cm    Other  wound on dorsal aspece 1.5x.5 was 2x1; to .2x .5 no change                [facility] Adult PT Treatment/Exercise - 08/16/17 0001      Manual Therapy   Manual Therapy  Manual Lymphatic Drainage (MLD)    Manual therapy comments  done seperate from all other aspects of treatment.    Manual Lymphatic Drainage (MLD)    supraclavicular, deep and superfical abdominal; B inguinal/axillary anastomosis and LE  B.  Manual  on LE done anteriorland posterior with posterior on stomach.     Compression Bandaging  B profore     Other Manual Therapy  Wounds covered with xeroformx prior to profore application                PT Short Term Goals - 07/28/17 1613      PT SHORT TERM GOAL #1   Title  PT woundson B LE  to have stopped weeping to decrease risk of infection     Time  1    Period  Weeks    Status  Achieved      PT SHORT TERM GOAL #2   Title  PT wounds on B LE  to be 100% granulated     Baseline  initial wound are healed but pt developed new wounds which have not totally healed.     Time  2    Period  Weeks    Status  Achieved      PT SHORT TERM GOAL #3   Title  PT volume to be decreased by 1.5 cm to allow pt to lift legs onto the beds with greater ease.     Time  2    Period  Weeks    Status  Achieved        PT Long Term Goals - 07/28/17 1615      PT LONG TERM GOAL #1   Title  Pt volume to be decreased by 3 cm to allow pt to don and doff clothes easier.     Time  6    Period  Weeks    Status  Achieved      PT LONG TERM GOAL #2   Title  PT wounds to be healed to decrease risk of infection     Baseline  as stated before inital wounds have healed but pt has new wounds which developed the week of 07/23/2017    Time  4    Period  Weeks    Status  Achieved      PT LONG TERM GOAL #3   Title  Pt pain in LE to be no  greater than a 3/10 to allow walking to be easier ( pt to be able to walk for five-10 minute without leg pain) . Pt is also limited due to her COPd    Status  Achieved            Plan - 08/16/17 1620    Clinical Impression Statement  Pt volumes appear to have stabilized.  Wound on LT dorsal aspect of foot is approximating.  Urged pt to acquire new compression so we can go over donning and doffing.  PT will be ready for discharge next week.     Rehab Potential  Good    PT Frequency  3x / week    PT Duration  6 weeks    PT Treatment/Interventions   Patient/family education;Manual techniques;Manual lymph drainage;Compression bandaging    PT Next Visit Plan     Begin wearing compression garment once aquired .  Measure volume and wounds on Wednesday.     Consulted and Agree with Plan of Care  Patient       Patient will benefit from skilled therapeutic intervention in order to improve the following deficits and impairments:  Cardiopulmonary status limiting activity, Decreased activity tolerance, Decreased strength, Difficulty walking, Pain, Increased edema, Other (comment)(open wounds)  Visit Diagnosis: Lymphedema, not elsewhere classified  Open wound of left lower leg with complication, sequela     Problem List Patient Active Problem List   Diagnosis Date Noted  . PMB (postmenopausal bleeding) 10/06/2016  . Chest pressure 01/05/2015  . Gastric erosion   . Encounter for screening colonoscopy 01/22/2014  . Constipation 01/20/2014  . GERD (gastroesophageal reflux disease) 01/20/2014  . Dyspepsia 01/20/2014  . SVT (supraventricular tachycardia) (HCC) 05/31/2013  . HTN (hypertension), benign 05/31/2013  . Hypercholesteremia 05/31/2013  . COPD (chronic obstructive pulmonary disease) (HCC) 05/31/2013  . Cervical stenosis of spine 05/31/2013  . Peripheral neuropathy 05/31/2013    Virgina Organ, PT CLT [phone] 08/16/2017, 4:22 PM  [facility] [address] [place], [place], [address] Phone: [phone]   Fax:  [phone]  Name: KADISHA GOODINE MRN: [id number] Date of Birth: Aug 23, 1960

## 2017-08-17 ENCOUNTER — Encounter (HOSPITAL_COMMUNITY): Payer: Self-pay | Admitting: Physical Therapy

## 2017-08-17 ENCOUNTER — Ambulatory Visit (HOSPITAL_COMMUNITY): Payer: Medicare Other | Admitting: Physical Therapy

## 2017-08-17 DIAGNOSIS — I890 Lymphedema, not elsewhere classified: Secondary | ICD-10-CM

## 2017-08-17 DIAGNOSIS — S81802S Unspecified open wound, left lower leg, sequela: Secondary | ICD-10-CM | POA: Diagnosis not present

## 2017-08-17 DIAGNOSIS — S81801D Unspecified open wound, right lower leg, subsequent encounter: Secondary | ICD-10-CM | POA: Diagnosis not present

## 2017-08-17 DIAGNOSIS — S91001D Unspecified open wound, right ankle, subsequent encounter: Secondary | ICD-10-CM | POA: Diagnosis not present

## 2017-08-17 DIAGNOSIS — S81001D Unspecified open wound, right knee, subsequent encounter: Secondary | ICD-10-CM | POA: Diagnosis not present

## 2017-08-17 DIAGNOSIS — R262 Difficulty in walking, not elsewhere classified: Secondary | ICD-10-CM | POA: Diagnosis not present

## 2017-08-17 NOTE — Therapy (Signed)
[facility] [address] [place], [place], [address] Phone: [phone]   Fax:  [phone]  Physical Therapy Treatment  Patient Details  Name: Gina Bradley MRN: [id number] Date of Birth: 1960/08/11 Referring Provider: Lewayne Bunting    Encounter Date: 08/17/2017  PT End of Session - 08/17/17 1430    Visit Number  19    Number of Visits  21    Date for PT Re-Evaluation  07/31/17 Reassess weekly on Wed.     Authorization Type  cert period 6/96/29-08/30/39; recert to 5/21 visit limit 30    Authorization - Visit Number  19    Authorization - Number of Visits  21    PT Start Time  1315    PT Stop Time  1428    PT Time Calculation (min)  73 min    Activity Tolerance  Patient tolerated treatment well    Behavior During Therapy  WFL for tasks assessed/performed       Past Medical History:  Diagnosis Date  . Anemia   . Cervical disc disease   . COPD (chronic obstructive pulmonary disease) (HCC)   . Diabetes mellitus without complication (HCC)   . Essential hypertension   . GERD (gastroesophageal reflux disease)   . Heart failure (HCC) 2018  . History of cardiac catheterization    Normal coronaries March 2015  . History of non-ST elevation myocardial infarction (NSTEMI)    Secondary to SVT  . Peripheral neuropathy   . Spinal stenosis   . SVT (supraventricular tachycardia) (HCC)     Past Surgical History:  Procedure Laterality Date  . CESAREAN SECTION    . ESOPHAGOGASTRODUODENOSCOPY N/A 02/10/2014   LKG:MWNUUV tiny antral erosion; otherwise normal EGD. No explanation for patient's symptoms. Gallbladder needs further evaluation.  [name] LEFT HEART CATHETERIZATION WITH CORONARY ANGIOGRAM N/A 06/03/2013   Procedure: LEFT HEART CATHETERIZATION WITH CORONARY ANGIOGRAM;  Surgeon: Lesleigh Noe, MD;  Location: [facility] CATH LAB;  Service: Cardiovascular;  Laterality: N/A;    There were no vitals filed for this visit.  Subjective Assessment -  08/17/17 1428    Subjective  PT states that she has made an appointment to get fitted for her compression garments for tomorrow.  She tried to get them today but they could not measure her.     Currently in Pain?  No/denies            LYMPHEDEMA/ONCOLOGY QUESTIONNAIRE - 08/16/17 1448      Right Lower Extremity Lymphedema   20 cm Proximal to Suprapatella  70 cm    10 cm Proximal to Suprapatella  62 cm    At Midpatella/Popliteal Crease  46 cm    30 cm Proximal to Floor at Lateral Plantar Foot  39 cm    20 cm Proximal to Floor at Lateral Plantar Foot  32 1    10 cm Proximal to Floor at Lateral Malleoli  22.5 cm    Circumference of ankle/heel  30.2 cm.    5 cm Proximal to 1st MTP Joint  23.8 cm    Across MTP Joint  24 cm    Around Proximal Great Toe  8.3 cm      Left Lower Extremity Lymphedema   20 cm Proximal to Suprapatella  72 cm    10 cm Proximal to Suprapatella  62.7 cm    At Midpatella/Popliteal Crease  47.5 cm    30 cm Proximal to Floor at Lateral Plantar Foot  39.8  cm    20 cm Proximal to Floor at Lateral Plantar Foot  32.8 cm    10 cm Proximal to Floor at Lateral Malleoli  22.2 cm    Circumference of ankle/heel  29.7 cm.    5 cm Proximal to 1st MTP Joint  23 cm    Across MTP Joint  24 cm    Other  wound on dorsal aspece 1.5x.5 was 2x1; to .2x .5 no change                [facility] Adult PT Treatment/Exercise - 08/17/17 0001      Manual Therapy   Manual Therapy  Manual Lymphatic Drainage (MLD)    Manual therapy comments  done seperate from all other aspects of treatment.    Manual Lymphatic Drainage (MLD)  to include supraclavicular, deep and superfical abdominal; B inguinal/axillary anastomosis and LE B.  Manual  on LE done anteriorly only due to time constraints.  Reviewed head and neck manual repeatedly;( pt continues to want to push fluid in face down instead of towards ear.)tion to pt.    Compression Bandaging  B profore with toe wraps.      Other Manual  Therapy  Wounds covered with xeroform prior to compression dressing.               PT Short Term Goals - 07/28/17 1613      PT SHORT TERM GOAL #1   Title  PT woundson B LE  to have stopped weeping to decrease risk of infection     Time  1    Period  Weeks    Status  Achieved      PT SHORT TERM GOAL #2   Title  PT wounds on B LE  to be 100% granulated     Baseline  initial wound are healed but pt developed new wounds which have not totally healed.     Time  2    Period  Weeks    Status  Achieved      PT SHORT TERM GOAL #3   Title  PT volume to be decreased by 1.5 cm to allow pt to lift legs onto the beds with greater ease.     Time  2    Period  Weeks    Status  Achieved        PT Long Term Goals - 07/28/17 1615      PT LONG TERM GOAL #1   Title  Pt volume to be decreased by 3 cm to allow pt to don and doff clothes easier.     Time  6    Period  Weeks    Status  Achieved      PT LONG TERM GOAL #2   Title  PT wounds to be healed to decrease risk of infection     Baseline  as stated before inital wounds have healed but pt has new wounds which developed the week of 07/23/2017    Time  4    Period  Weeks    Status  Achieved      PT LONG TERM GOAL #3   Title  Pt pain in LE to be no greater than a 3/10 to allow walking to be easier ( pt to be able to walk for five-10 minute without leg pain) . Pt is also limited due to her COPd    Status  Achieved  Plan - 08/17/17 1431    Clinical Impression Statement  PT wounds continue to approximate.  Went over head and neck lymphedema multiple times.  Pt urged to use compression pump everyday over the weekend since she is going to get her garments tomorrow.     Rehab Potential  Good    PT Frequency  3x / week    PT Duration  6 weeks    PT Treatment/Interventions  Patient/family education;Manual techniques;Manual lymph drainage;Compression bandaging    PT Next Visit Plan  Make sure pt is having no difficulty  donning and doffing her garments.     Consulted and Agree with Plan of Care  Patient       Patient will benefit from skilled therapeutic intervention in order to improve the following deficits and impairments:  Cardiopulmonary status limiting activity, Decreased activity tolerance, Decreased strength, Difficulty walking, Pain, Increased edema, Other (comment)(open wounds)  Visit Diagnosis: Lymphedema, not elsewhere classified  Open wound of left lower leg with complication, sequela     Problem List Patient Active Problem List   Diagnosis Date Noted  . PMB (postmenopausal bleeding) 10/06/2016  . Chest pressure 01/05/2015  . Gastric erosion   . Encounter for screening colonoscopy 01/22/2014  . Constipation 01/20/2014  . GERD (gastroesophageal reflux disease) 01/20/2014  . Dyspepsia 01/20/2014  . SVT (supraventricular tachycardia) (HCC) 05/31/2013  . HTN (hypertension), benign 05/31/2013  . Hypercholesteremia 05/31/2013  . COPD (chronic obstructive pulmonary disease) (HCC) 05/31/2013  . Cervical stenosis of spine 05/31/2013  . Peripheral neuropathy 05/31/2013    Virgina Organ, PT CLT [phone] 08/17/2017, 2:33 PM  [facility] [address] [place], [place], [address] Phone: [phone]   Fax:  [phone]  Name: SHERRILYNN GUDGEL MRN: [id number] Date of Birth: February 03, 1961

## 2017-08-18 ENCOUNTER — Encounter

## 2017-08-21 ENCOUNTER — Encounter (HOSPITAL_COMMUNITY): Payer: Medicare Other | Admitting: Physical Therapy

## 2017-08-21 DIAGNOSIS — S299XXA Unspecified injury of thorax, initial encounter: Secondary | ICD-10-CM | POA: Diagnosis not present

## 2017-08-22 ENCOUNTER — Ambulatory Visit (HOSPITAL_COMMUNITY): Payer: Medicare Other | Admitting: Physical Therapy

## 2017-08-22 DIAGNOSIS — S81001D Unspecified open wound, right knee, subsequent encounter: Secondary | ICD-10-CM | POA: Diagnosis not present

## 2017-08-22 DIAGNOSIS — S91001D Unspecified open wound, right ankle, subsequent encounter: Secondary | ICD-10-CM | POA: Diagnosis not present

## 2017-08-22 DIAGNOSIS — R262 Difficulty in walking, not elsewhere classified: Secondary | ICD-10-CM

## 2017-08-22 DIAGNOSIS — S81801D Unspecified open wound, right lower leg, subsequent encounter: Secondary | ICD-10-CM

## 2017-08-22 DIAGNOSIS — I890 Lymphedema, not elsewhere classified: Secondary | ICD-10-CM

## 2017-08-22 DIAGNOSIS — S81802S Unspecified open wound, left lower leg, sequela: Secondary | ICD-10-CM
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
## 2017-08-23 ENCOUNTER — Emergency Department (HOSPITAL_COMMUNITY): Payer: No Typology Code available for payment source

## 2017-08-23 ENCOUNTER — Emergency Department (HOSPITAL_COMMUNITY)
Admission: EM | Admit: 2017-08-23 | Discharge: 2017-08-23 | Disposition: A | Discharge status: Home / Self Care | Payer: No Typology Code available for payment source | Attending: Emergency Medicine | Admitting: Emergency Medicine

## 2017-08-23 ENCOUNTER — Other Ambulatory Visit: Payer: Self-pay

## 2017-08-23 ENCOUNTER — Ambulatory Visit (HOSPITAL_COMMUNITY): Payer: Medicare Other | Admitting: Physical Therapy

## 2017-08-23 ENCOUNTER — Encounter (HOSPITAL_COMMUNITY): Payer: Self-pay | Admitting: Emergency Medicine

## 2017-08-23 DIAGNOSIS — R51 Headache: Secondary | ICD-10-CM | POA: Insufficient documentation

## 2017-08-23 DIAGNOSIS — I10 Essential (primary) hypertension: Secondary | ICD-10-CM | POA: Diagnosis not present

## 2017-08-23 DIAGNOSIS — Z7984 Long term (current) use of oral hypoglycemic drugs: Secondary | ICD-10-CM | POA: Insufficient documentation

## 2017-08-23 DIAGNOSIS — M25561 Pain in right knee: Secondary | ICD-10-CM | POA: Insufficient documentation

## 2017-08-23 DIAGNOSIS — T07XXXA Unspecified multiple injuries, initial encounter: Secondary | ICD-10-CM | POA: Diagnosis present

## 2017-08-23 DIAGNOSIS — M549 Dorsalgia, unspecified: Secondary | ICD-10-CM | POA: Diagnosis not present

## 2017-08-23 DIAGNOSIS — Y92410 Unspecified street and highway as the place of occurrence of the external cause: Secondary | ICD-10-CM | POA: Diagnosis not present

## 2017-08-23 DIAGNOSIS — S199XXA Unspecified injury of neck, initial encounter: Secondary | ICD-10-CM | POA: Diagnosis not present

## 2017-08-23 DIAGNOSIS — R0789 Other chest pain: Secondary | ICD-10-CM | POA: Insufficient documentation

## 2017-08-23 DIAGNOSIS — S0083XA Contusion of other part of head, initial encounter: Secondary | ICD-10-CM | POA: Diagnosis not present

## 2017-08-23 DIAGNOSIS — Y998 Other external cause status: Secondary | ICD-10-CM | POA: Insufficient documentation

## 2017-08-23 DIAGNOSIS — Y9389 Activity, other specified: Secondary | ICD-10-CM | POA: Diagnosis not present

## 2017-08-23 DIAGNOSIS — S299XXA Unspecified injury of thorax, initial encounter: Secondary | ICD-10-CM | POA: Diagnosis not present

## 2017-08-23 DIAGNOSIS — E119 Type 2 diabetes mellitus without complications: Secondary | ICD-10-CM | POA: Insufficient documentation

## 2017-08-23 DIAGNOSIS — F17210 Nicotine dependence, cigarettes, uncomplicated: Secondary | ICD-10-CM | POA: Diagnosis not present

## 2017-08-23 DIAGNOSIS — S20219A Contusion of unspecified front wall of thorax, initial encounter: Secondary | ICD-10-CM | POA: Diagnosis not present

## 2017-08-23 DIAGNOSIS — S0990XA Unspecified injury of head, initial encounter: Secondary | ICD-10-CM | POA: Diagnosis not present

## 2017-08-23 DIAGNOSIS — R079 Chest pain, unspecified: Secondary | ICD-10-CM | POA: Diagnosis not present

## 2017-08-23 DIAGNOSIS — M545 Low back pain: Secondary | ICD-10-CM | POA: Diagnosis not present

## 2017-08-23 LAB — URINALYSIS, ROUTINE W REFLEX MICROSCOPIC
Bilirubin Urine: NEGATIVE
Glucose, UA: NEGATIVE mg/dL
Ketones, ur: NEGATIVE mg/dL
Leukocytes, UA: NEGATIVE
Nitrite: NEGATIVE
Protein, ur: NEGATIVE mg/dL
Specific Gravity, Urine: 1.017 (ref 1.005–1.030)
pH: 6 (ref 5.0–8.0)

## 2017-08-23 MED ORDER — HYDROCODONE-ACETAMINOPHEN 5-325 MG PO TABS
2.0000 | ORAL_TABLET | Freq: Once | ORAL | Status: AC
  Administered 2017-08-23: 2 via ORAL
  Filled 2017-08-23: qty 2

## 2017-08-23 MED ORDER — ONDANSETRON HCL 4 MG PO TABS
4.0000 mg | ORAL_TABLET | Freq: Once | ORAL | Status: AC
  Administered 2017-08-23: 4 mg via ORAL
  Filled 2017-08-23: qty 1

## 2017-08-23 MED ORDER — TRAMADOL HCL 50 MG PO TABS: ORAL_TABLET | ORAL | 0 refills | Status: DC

## 2017-08-23 MED ORDER — METHOCARBAMOL 500 MG PO TABS: 500.0000 mg | ORAL_TABLET | Freq: Three times a day (TID) | ORAL | 0 refills | Status: DC

## 2017-08-23 MED ORDER — IBUPROFEN 600 MG PO TABS: 600.0000 mg | ORAL_TABLET | Freq: Four times a day (QID) | ORAL | 0 refills | Status: DC

## 2017-08-23 MED ORDER — METHOCARBAMOL 500 MG PO TABS
500.0000 mg | ORAL_TABLET | Freq: Once | ORAL | Status: AC
  Administered 2017-08-23: 500 mg via ORAL
  Filled 2017-08-23: qty 1

## 2017-08-23 NOTE — ED Notes (Signed)
Large knot to central chest area

## 2017-08-23 NOTE — ED Notes (Signed)
CBG 127 
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
## 2017-08-23 NOTE — Discharge Instructions (Addendum)
Vital signs are within normal limits.  The CT scan of your head, neck, and chest are all negative for fractures or acute problems.  X-ray of your lumbar spine shows some arthritis and degenerative changes, but no fracture or dislocation.  Your examination suggests multiple areas of contusion.  Please use ibuprofen every 6 hours or with breakfast, lunch, dinner, and at bedtime.  Use Robaxin 3 times daily for spasm pain.  Use Ultram for more severe pain.  Ultram and Robaxin may cause drowsiness.  Please use these medications with caution.  Please see Dr. Sherwood Gambler for follow-up and recheck in the office.

## 2017-08-23 NOTE — ED Notes (Signed)
Pt is leaving with son. Son is driving

## 2017-08-23 NOTE — ED Notes (Signed)
Notified pt of urine sample.
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
## 2017-08-24 ENCOUNTER — Ambulatory Visit (HOSPITAL_COMMUNITY): Payer: Medicare Other | Admitting: Physical Therapy

## 2017-08-24 ENCOUNTER — Encounter (HOSPITAL_COMMUNITY): Payer: Self-pay | Admitting: Physical Therapy

## 2017-08-24 DIAGNOSIS — I890 Lymphedema, not elsewhere classified: Secondary | ICD-10-CM

## 2017-08-24 DIAGNOSIS — S81801D Unspecified open wound, right lower leg, subsequent encounter: Secondary | ICD-10-CM

## 2017-08-24 DIAGNOSIS — S91001D Unspecified open wound, right ankle, subsequent encounter: Secondary | ICD-10-CM | POA: Diagnosis not present

## 2017-08-24 DIAGNOSIS — R262 Difficulty in walking, not elsewhere classified: Secondary | ICD-10-CM | POA: Diagnosis not present

## 2017-08-24 DIAGNOSIS — S81802S Unspecified open wound, left lower leg, sequela: Secondary | ICD-10-CM | POA: Diagnosis not present

## 2017-08-24 DIAGNOSIS — S81001D Unspecified open wound, right knee, subsequent encounter: Secondary | ICD-10-CM

## 2017-08-24 LAB — CBG MONITORING, ED: Glucose-Capillary: 127 mg/dL — ABNORMAL HIGH (ref 65–99)

## 2017-08-24 NOTE — Therapy (Signed)
[facility] [address] [place], [place], [address] Phone: [phone]   Fax:  [phone]  Physical Therapy Treatment  Patient Details  Name: Gina Bradley MRN: [id number] Date of Birth: 1960/09/19 Referring Provider: Lewayne Bunting   Encounter Date: 08/24/2017  PT End of Session - 08/24/17 1528    Visit Number  21    Number of Visits  30    Date for PT Re-Evaluation  09/19/17    Authorization Type   recert to 6/30 visit limit 30    Authorization - Visit Number  21    Authorization - Number of Visits  30    Behavior During Therapy  [facility] for tasks assessed/performed       Past Medical History:  Diagnosis Date  . Anemia   . Cervical disc disease   . COPD (chronic obstructive pulmonary disease) (HCC)   . Diabetes mellitus without complication (HCC)   . Essential hypertension   . GERD (gastroesophageal reflux disease)   . Heart failure (HCC) 2018  . History of cardiac catheterization    Normal coronaries March 2015  . History of non-ST elevation myocardial infarction (NSTEMI)    Secondary to SVT  . Peripheral neuropathy   . Spinal stenosis   . SVT (supraventricular tachycardia) (HCC)     Past Surgical History:  Procedure Laterality Date  . CESAREAN SECTION    . ESOPHAGOGASTRODUODENOSCOPY N/A 02/10/2014   ONG:EXBMWU tiny antral erosion; otherwise normal EGD. No explanation for patient's symptoms. Gallbladder needs further evaluation.  [name] LEFT HEART CATHETERIZATION WITH CORONARY ANGIOGRAM N/A 06/03/2013   Procedure: LEFT HEART CATHETERIZATION WITH CORONARY ANGIOGRAM;  Surgeon: Lesleigh Noe, MD;  Location: [facility] CATH LAB;  Service: Cardiovascular;  Laterality: N/A;    There were no vitals filed for this visit.  Subjective Assessment - 08/24/17 1407    Subjective  Pt 25 minutes late for appointment                        [facility] Adult PT Treatment/Exercise - 08/24/17 0001      Manual Therapy   Manual Therapy   Compression Bandaging;Other (comment)    Manual therapy comments  done seperate from all other aspects of treatment.    Manual Lymphatic Drainage (MLD)  supraclavicular, deep and superfical abdominal; inguinal axillary andastomosis and thigh done anteriorly only bilaterally     Compression Bandaging  B profore with toe wraps.      Other Manual Therapy  Lt dorsal foot wound 0.4X1.2X 0.2 cm.  All other wounds are now healed.  Wound bladed followed by xeroform prior to profore application.               PT Short Term Goals - 08/22/17 1547      PT SHORT TERM GOAL #1   Title  PT woundson B LE  to have stopped weeping to decrease risk of infection     Time  1    Period  Weeks    Status  Achieved      PT SHORT TERM GOAL #2   Title  PT wounds on B LE  to be 100% granulated     Baseline  initial wound are healed but pt developed new wounds which have not totally healed.     Time  2    Period  Weeks    Status  Achieved      PT SHORT TERM GOAL #3  Title  PT volume to be decreased by 1.5 cm to allow pt to lift legs onto the beds with greater ease.     Time  2    Period  Weeks    Status  Achieved        PT Long Term Goals - 08/22/17 1548      PT LONG TERM GOAL #1   Title  Pt volume to be decreased by 3 cm to allow pt to don and doff clothes easier.     Time  6    Period  Weeks    Status  Achieved      PT LONG TERM GOAL #2   Title  PT wounds to be healed to decrease risk of infection     Baseline  as stated before inital wounds have healed but pt has new wounds which developed the week of 07/23/2017    Time  4    Period  Weeks    Status  Achieved      PT LONG TERM GOAL #3   Title  Pt pain in LE to be no greater than a 3/10 to allow walking to be easier ( pt to be able to walk for five-10 minute without leg pain) . Pt is also limited due to her COPd    Status  Achieved            Plan - 08/24/17 1524    Clinical Impression Statement  Pt late for appointment.  She  comes with the correct compression, 20-57mmhg but the wrong size (pt brings xxL and should be a large).  Pt Rt dorsal and Lt toe wounds have healed but LT dorsal wound remains.  Therapist bladed slough off the top followed by application of xeroform prior to placing compression bandaging in place.     Rehab Potential  Good    PT Frequency  3x / week    PT Duration  6 weeks    PT Treatment/Interventions  Patient/family education;Manual techniques;Manual lymph drainage;Compression bandaging    PT Next Visit Plan  Once correct size of compression hose is acquired begin donning and doffing on Rt side.  Lt side once wound is healed.  Once pt can don and doff compression she will be ready for discharge.     Consulted and Agree with Plan of Care  Patient       Patient will benefit from skilled therapeutic intervention in order to improve the following deficits and impairments:  Cardiopulmonary status limiting activity, Decreased activity tolerance, Decreased strength, Difficulty walking, Pain, Increased edema, Other (comment)(open wounds)  Visit Diagnosis: Lymphedema, not elsewhere classified  Open wound of right leg, and ankle with complication, subsequent encounter     Problem List Patient Active Problem List   Diagnosis Date Noted  . PMB (postmenopausal bleeding) 10/06/2016  . Chest pressure 01/05/2015  . Gastric erosion   . Encounter for screening colonoscopy 01/22/2014  . Constipation 01/20/2014  . GERD (gastroesophageal reflux disease) 01/20/2014  . Dyspepsia 01/20/2014  . SVT (supraventricular tachycardia) (HCC) 05/31/2013  . HTN (hypertension), benign 05/31/2013  . Hypercholesteremia 05/31/2013  . COPD (chronic obstructive pulmonary disease) (HCC) 05/31/2013  . Cervical stenosis of spine 05/31/2013  . Peripheral neuropathy 05/31/2013    RUSSELL,CINDY 08/24/2017, 3:29 PM  [facility] [address] [place], [place], [address] Phone:  [phone]   Fax:  [phone]  Name: KEVINA PILOTO MRN: [id number] Date of Birth: 02-Apr-1961

## 2017-08-29 ENCOUNTER — Ambulatory Visit (HOSPITAL_COMMUNITY): Payer: Medicare Other | Admitting: Physical Therapy

## 2017-08-29 ENCOUNTER — Telehealth (HOSPITAL_COMMUNITY): Payer: Self-pay | Admitting: Physical Therapy

## 2017-08-29 NOTE — Telephone Encounter (Signed)
Called re missed appointment.  Pt phone is full.    Virgina Organ, PT CLT [phone]

## 2017-08-30 ENCOUNTER — Encounter (HOSPITAL_COMMUNITY): Payer: Medicare Other | Admitting: Physical Therapy

## 2017-09-01 ENCOUNTER — Ambulatory Visit (HOSPITAL_COMMUNITY): Payer: Medicare Other | Admitting: Physical Therapy

## 2017-09-01 DIAGNOSIS — I890 Lymphedema, not elsewhere classified: Secondary | ICD-10-CM | POA: Diagnosis not present

## 2017-09-01 DIAGNOSIS — S91001D Unspecified open wound, right ankle, subsequent encounter: Secondary | ICD-10-CM | POA: Diagnosis not present

## 2017-09-01 DIAGNOSIS — S81001D Unspecified open wound, right knee, subsequent encounter: Secondary | ICD-10-CM | POA: Diagnosis not present

## 2017-09-01 DIAGNOSIS — R262 Difficulty in walking, not elsewhere classified: Secondary | ICD-10-CM | POA: Diagnosis not present

## 2017-09-01 DIAGNOSIS — S81802S Unspecified open wound, left lower leg, sequela: Secondary | ICD-10-CM | POA: Diagnosis not present

## 2017-09-01 DIAGNOSIS — S81801D Unspecified open wound, right lower leg, subsequent encounter: Secondary | ICD-10-CM | POA: Diagnosis not present

## 2017-09-01 NOTE — Therapy (Signed)
[facility] [address] [place], [place], [address] Phone: [phone]   Fax:  [phone]  Physical Therapy Treatment  Patient Details  Name: Gina Bradley MRN: [id number] Date of Birth: Sep 28, 1960 Referring Provider: Lewayne Bunting   Encounter Date: 09/01/2017  PT End of Session - 09/01/17 1536    Visit Number  22    Number of Visits  30    Date for PT Re-Evaluation  09/19/17    Authorization Type   recert to 6/30 visit limit 30    Authorization - Visit Number  22    Authorization - Number of Visits  30    PT Start Time  1325    PT Stop Time  1437    PT Time Calculation (min)  72 min    Behavior During Therapy  [facility] for tasks assessed/performed       Past Medical History:  Diagnosis Date  . Anemia   . Cervical disc disease   . COPD (chronic obstructive pulmonary disease) (HCC)   . Diabetes mellitus without complication (HCC)   . Essential hypertension   . GERD (gastroesophageal reflux disease)   . Heart failure (HCC) 2018  . History of cardiac catheterization    Normal coronaries March 2015  . History of non-ST elevation myocardial infarction (NSTEMI)    Secondary to SVT  . Peripheral neuropathy   . Spinal stenosis   . SVT (supraventricular tachycardia) (HCC)     Past Surgical History:  Procedure Laterality Date  . CESAREAN SECTION    . ESOPHAGOGASTRODUODENOSCOPY N/A 02/10/2014   XLK:[id number] tiny antral erosion; otherwise normal EGD. No explanation for patient's symptoms. Gallbladder needs further evaluation.  [name] LEFT HEART CATHETERIZATION WITH CORONARY ANGIOGRAM N/A 06/03/2013   Procedure: LEFT HEART CATHETERIZATION WITH CORONARY ANGIOGRAM;  Surgeon: Lesleigh Noe, MD;  Location: [facility] CATH LAB;  Service: Cardiovascular;  Laterality: N/A;    There were no vitals filed for this visit.  Subjective Assessment - 09/01/17 1526    Subjective  Pt 25 minutes late for appointment .  She missed last appointment stating that she  had confused the times due to [facility].     Currently in Pain?  No/denies            LYMPHEDEMA/ONCOLOGY QUESTIONNAIRE - 09/01/17 1421      Right Lower Extremity Lymphedema   20 cm Proximal to Suprapatella  72 cm    10 cm Proximal to Suprapatella  61 cm    At Midpatella/Popliteal Crease  46.3 cm 5/21= 46    30 cm Proximal to Floor at Lateral Plantar Foot  40.5 cm 5/21/ was 39.8    20 cm Proximal to Floor at Lateral Plantar Foot  32 1 5/21 was 33/5    10 cm Proximal to Floor at Lateral Malleoli  22.8 cm 5/21 was 22.5    Circumference of ankle/heel  30.5 cm. 5/21 was 29.4    5 cm Proximal to 1st MTP Joint  24 cm 5/21 was 24.5     Across MTP Joint  23.3 cm 5/21 was 24.3    Around Proximal Great Toe  9 cm      Left Lower Extremity Lymphedema   20 cm Proximal to Suprapatella  70 cm was 68    10 cm Proximal to Suprapatella  59.3 cm was 59    At Midpatella/Popliteal Crease  47.5 cm was 48    30 cm Proximal to Floor at Lateral Plantar Foot  41 cm was 41.2    20 cm Proximal to Floor at Lateral Plantar Foot  33 cm was 33    10 cm Proximal to Floor at Lateral Malleoli  23 cm was 23    Circumference of ankle/heel  31 cm. was 30.2    5 cm Proximal to 1st MTP Joint  24 cm was 23    Across MTP Joint  23.8 cm was 24    Around Proximal Great Toe  9 cm was 9                [facility] Adult PT Treatment/Exercise - 09/01/17 0001      Manual Therapy   Manual Therapy  Manual Lymphatic Drainage (MLD)    Manual therapy comments  done seperate from all other aspects of treatment.    Manual Lymphatic Drainage (MLD)  supraclavicular, deep and superfical abdominal; inguinal axillary andastomosis and thigh done both anteriorl and posterior bilaterally      Compression Bandaging  compression hose     Other Manual Therapy  measured volume;Lt dorsal foot wound 0.4X.8 X 0.2 cm.  Wound bladed followed by xeroform prior to donning compression hose. [name]             PT Education - 09/01/17  1535    Education provided  Yes    Education Details  instructed pt how to don compression stockings both with and without butler.     Person(s) Educated  Patient    Methods  Explanation    Comprehension  Verbalized understanding       PT Short Term Goals - 08/22/17 1547      PT SHORT TERM GOAL #1   Title  PT woundson B LE  to have stopped weeping to decrease risk of infection     Time  1    Period  Weeks    Status  Achieved      PT SHORT TERM GOAL #2   Title  PT wounds on B LE  to be 100% granulated     Baseline  initial wound are healed but pt developed new wounds which have not totally healed.     Time  2    Period  Weeks    Status  Achieved      PT SHORT TERM GOAL #3   Title  PT volume to be decreased by 1.5 cm to allow pt to lift legs onto the beds with greater ease.     Time  2    Period  Weeks    Status  Achieved        PT Long Term Goals - 08/22/17 1548      PT LONG TERM GOAL #1   Title  Pt volume to be decreased by 3 cm to allow pt to don and doff clothes easier.     Time  6    Period  Weeks    Status  Achieved      PT LONG TERM GOAL #2   Title  PT wounds to be healed to decrease risk of infection     Baseline  as stated before inital wounds have healed but pt has new wounds which developed the week of 07/23/2017    Time  4    Period  Weeks    Status  Achieved      PT LONG TERM GOAL #3   Title  Pt pain in LE to be no greater than a 3/10 to allow walking  to be easier ( pt to be able to walk for five-10 minute without leg pain) . Pt is also limited due to her COPd    Status  Achieved            Plan - 09/01/17 1537    Clinical Impression Statement  Pt comes with correct size and compression with compression garment.  Pt measured and volumes are stable indicating pt is ready for discharge.  Pt was instructed how to don compression stockings with and without a butler.  Pt will do better with a petite size but is able to wear the normal; explained this  to pt so the next pair that she orders she will order a petites.  Therapist instructed pt to [facility] in the morning and doff at night and to use the compression pump for an hour everyday over the weekend.  Pt verbalized understanding.     Rehab Potential  Good    PT Frequency  3x / week    PT Duration  6 weeks    PT Treatment/Interventions  Patient/family education;Manual techniques;Manual lymph drainage;Compression bandaging    PT Next Visit Plan  Assess wound, assess how pt did with donning and doffing compression garment, assess how pt did with compression pump.  If all things look good have pt fill out life impact and may discharge.     Consulted and Agree with Plan of Care  Patient       Patient will benefit from skilled therapeutic intervention in order to improve the following deficits and impairments:  Cardiopulmonary status limiting activity, Decreased activity tolerance, Decreased strength, Difficulty walking, Pain, Increased edema, Other (comment)(open wounds)  Visit Diagnosis: No diagnosis found.     Problem List Patient Active Problem List   Diagnosis Date Noted  . PMB (postmenopausal bleeding) 10/06/2016  . Chest pressure 01/05/2015  . Gastric erosion   . Encounter for screening colonoscopy 01/22/2014  . Constipation 01/20/2014  . GERD (gastroesophageal reflux disease) 01/20/2014  . Dyspepsia 01/20/2014  . SVT (supraventricular tachycardia) (HCC) 05/31/2013  . HTN (hypertension), benign 05/31/2013  . Hypercholesteremia 05/31/2013  . COPD (chronic obstructive pulmonary disease) (HCC) 05/31/2013  . Cervical stenosis of spine 05/31/2013  . Peripheral neuropathy 05/31/2013    Virgina Organ, PT CLT [phone] 09/01/2017, 3:46 PM  [facility] [address] [place], [place], [address] Phone: [phone]   Fax:  [phone]  Name: Gina Bradley MRN: [id number] Date of Birth: 29-Aug-1960

## 2017-09-04 ENCOUNTER — Ambulatory Visit (HOSPITAL_COMMUNITY): Payer: Medicare Other | Attending: Internal Medicine | Admitting: Physical Therapy

## 2017-09-04 ENCOUNTER — Telehealth (HOSPITAL_COMMUNITY): Payer: Self-pay | Admitting: Physical Therapy

## 2017-09-04 DIAGNOSIS — J449 Chronic obstructive pulmonary disease, unspecified: Secondary | ICD-10-CM | POA: Insufficient documentation

## 2017-09-04 DIAGNOSIS — K219 Gastro-esophageal reflux disease without esophagitis: Secondary | ICD-10-CM | POA: Diagnosis not present

## 2017-09-04 DIAGNOSIS — Z9889 Other specified postprocedural states: Secondary | ICD-10-CM | POA: Insufficient documentation

## 2017-09-04 DIAGNOSIS — E119 Type 2 diabetes mellitus without complications: Secondary | ICD-10-CM | POA: Insufficient documentation

## 2017-09-04 DIAGNOSIS — I110 Hypertensive heart disease with heart failure: Secondary | ICD-10-CM | POA: Insufficient documentation

## 2017-09-04 DIAGNOSIS — S81802S Unspecified open wound, left lower leg, sequela: Secondary | ICD-10-CM | POA: Insufficient documentation

## 2017-09-04 DIAGNOSIS — I890 Lymphedema, not elsewhere classified: Secondary | ICD-10-CM | POA: Insufficient documentation

## 2017-09-04 DIAGNOSIS — I10 Essential (primary) hypertension: Secondary | ICD-10-CM | POA: Diagnosis not present

## 2017-09-04 DIAGNOSIS — E669 Obesity, unspecified: Secondary | ICD-10-CM | POA: Diagnosis not present

## 2017-09-04 DIAGNOSIS — I509 Heart failure, unspecified: Secondary | ICD-10-CM | POA: Insufficient documentation

## 2017-09-04 NOTE — Telephone Encounter (Signed)
PT called re no show. Reminded pt that her next appointment is Wed. At 2:30 pm.  Gina Bradley, PT CLT [phone]

## 2017-09-06 ENCOUNTER — Encounter (HOSPITAL_COMMUNITY): Payer: Self-pay | Admitting: Physical Therapy

## 2017-09-06 ENCOUNTER — Ambulatory Visit (HOSPITAL_COMMUNITY): Payer: Medicare Other | Admitting: Physical Therapy

## 2017-09-06 DIAGNOSIS — I890 Lymphedema, not elsewhere classified: Secondary | ICD-10-CM | POA: Diagnosis not present

## 2017-09-06 DIAGNOSIS — I110 Hypertensive heart disease with heart failure: Secondary | ICD-10-CM | POA: Diagnosis not present

## 2017-09-06 DIAGNOSIS — G4733 Obstructive sleep apnea (adult) (pediatric): Secondary | ICD-10-CM | POA: Diagnosis not present

## 2017-09-06 DIAGNOSIS — S81802S Unspecified open wound, left lower leg, sequela: Secondary | ICD-10-CM | POA: Diagnosis not present

## 2017-09-06 DIAGNOSIS — E119 Type 2 diabetes mellitus without complications: Secondary | ICD-10-CM | POA: Diagnosis not present

## 2017-09-06 DIAGNOSIS — Z9889 Other specified postprocedural states: Secondary | ICD-10-CM | POA: Diagnosis not present

## 2017-09-06 DIAGNOSIS — I509 Heart failure, unspecified: Secondary | ICD-10-CM | POA: Diagnosis not present

## 2017-09-06 DIAGNOSIS — J449 Chronic obstructive pulmonary disease, unspecified: Secondary | ICD-10-CM | POA: Diagnosis not present

## 2017-09-06 NOTE — Therapy (Signed)
[facility] [address] [place], [place], [address] Phone: [phone]   Fax:  [phone]  Physical Therapy Treatment  Patient Details  Name: Gina Bradley MRN: [id number] Date of Birth: 02-17-1961 Referring Provider: Lewayne Buntingaylor Gregg   Encounter Date: 09/06/2017  PT End of Session - 09/06/17 1616    Visit Number  23    Number of Visits  30    Date for PT Re-Evaluation  09/19/17    Authorization Type   recert to 6/30 visit limit 30    Authorization - Visit Number  23    Authorization - Number of Visits  30    PT Start Time  1455    PT Stop Time  1600    PT Time Calculation (min)  65 min    Behavior During Therapy  [facility] for tasks assessed/performed       Past Medical History:  Diagnosis Date  . Anemia   . Cervical disc disease   . COPD (chronic obstructive pulmonary disease) (HCC)   . Diabetes mellitus without complication (HCC)   . Essential hypertension   . GERD (gastroesophageal reflux disease)   . Heart failure (HCC) 2018  . History of cardiac catheterization    Normal coronaries March 2015  . History of non-ST elevation myocardial infarction (NSTEMI)    Secondary to SVT  . Peripheral neuropathy   . Spinal stenosis   . SVT (supraventricular tachycardia) (HCC)     Past Surgical History:  Procedure Laterality Date  . CESAREAN SECTION    . ESOPHAGOGASTRODUODENOSCOPY N/A 02/10/2014   RJJ:[id number]:Single tiny antral erosion; otherwise normal EGD. No explanation for patient's symptoms. Gallbladder needs further evaluation.  [name]. LEFT HEART CATHETERIZATION WITH CORONARY ANGIOGRAM N/A 06/03/2013   Procedure: LEFT HEART CATHETERIZATION WITH CORONARY ANGIOGRAM;  Surgeon: Lesleigh NoeHenry W Smith III, MD;  [facility];  Service: Cardiovascular;  Laterality: N/A;    There were no vitals filed for this visit.  Subjective Assessment - 09/06/17 1624    Subjective  Pt 25 minutes late for appointment .  She missed last appointmen.  PT states that  she walked longer than she has in a year over the weekend.     Currently in Pain?  No/denies         [facility] - 09/06/17 0001      Assessment   Medical Diagnosis  B LE Lymphedema; head and neck lymphedema     Next MD Visit  06/03/2018    Prior Therapy  none       Precautions   Precautions  None      Balance Screen   Has the patient fallen in the past 6 months  No    Has the patient had a decrease in activity level because of a fear of falling?   No    Is the patient reluctant to leave their home because of a fear of falling?   No        LYMPHEDEMA/ONCOLOGY QUESTIONNAIRE - 09/06/17 1458      Right Lower Extremity Lymphedema   20 cm Proximal to Suprapatella  72 cm was 72    10 cm Proximal to Suprapatella  61.2 cm was 61    At Midpatella/Popliteal Crease  44 cm was 46.3    30 cm Proximal to Floor at Lateral Plantar Foot  43 cm 5/21/ was 40.5    20 cm Proximal to Floor at Lateral Plantar Foot  37 1 was 32  10 cm Proximal to Floor at Lateral Malleoli  25 cm was 22.8    Circumference of ankle/heel  33.2 cm. was30.5    5 cm Proximal to 1st MTP Joint  27 cm was 24    Across MTP Joint  25 cm was23.3    Around Proximal Great Toe  9.2 cm      Left Lower Extremity Lymphedema   20 cm Proximal to Suprapatella  74 cm was 70    10 cm Proximal to Suprapatella  63.5 cm was 59.5    At Midpatella/Popliteal Crease  45.5 cm was 47.5    30 cm Proximal to Floor at Lateral Plantar Foot  44.5 cm was 41.    20 cm Proximal to Floor at Lateral Plantar Foot  36 cm was 33    10 cm Proximal to Floor at Lateral Malleoli  26 cm was 23    Circumference of ankle/heel  32.5 cm. was 30.2    5 cm Proximal to 1st MTP Joint  26 cm was 24    Across MTP Joint  24.5 cm was 23.8    Around Proximal Great Toe  9.5 cm was 9    Other  wound on dorsal foot .3x.5 was 1.5x.5                 [facility] Adult PT Treatment/Exercise - 09/06/17 0001      Manual Therapy   Manual Therapy  Manual Lymphatic  Drainage (MLD)    Manual therapy comments  done seperate from all other aspects of treatment.    Manual Lymphatic Drainage (MLD)  supraclavicular, deep and superfical abdominal; inguinal/axilarry  anastomosis and thigh done anteriorly only today due to time restraints    Compression Bandaging  compression hose     Other Manual Therapy  measured volume;Lt dorsal foot wound 0.4X.8 X 0.2 cm.  Wound bladed followed by xeroform prior to donning compression hose. [name]               PT Short Term Goals - 08/22/17 1547      PT SHORT TERM GOAL #1   Title  PT woundson B LE  to have stopped weeping to decrease risk of infection     Time  1    Period  Weeks    Status  Achieved      PT SHORT TERM GOAL #2   Title  PT wounds on B LE  to be 100% granulated     Baseline  initial wound are healed but pt developed new wounds which have not totally healed.     Time  2    Period  Weeks    Status  Achieved      PT SHORT TERM GOAL #3   Title  PT volume to be decreased by 1.5 cm to allow pt to lift legs onto the beds with greater ease.     Time  2    Period  Weeks    Status  Achieved        PT Long Term Goals - 08/22/17 1548      PT LONG TERM GOAL #1   Title  Pt volume to be decreased by 3 cm to allow pt to don and doff clothes easier.     Time  6    Period  Weeks    Status  Achieved      PT LONG TERM GOAL #2   Title  PT wounds to be healed to  decrease risk of infection     Baseline  as stated before inital wounds have healed but pt has new wounds which developed the week of 07/23/2017    Time  4    Period  Weeks    Status  Achieved      PT LONG TERM GOAL #3   Title  Pt pain in LE to be no greater than a 3/10 to allow walking to be easier ( pt to be able to walk for five-10 minute without leg pain) . Pt is also limited due to her COPd    Status  Achieved            Plan - 09/06/17 1618    Clinical Impression Statement  Pt states that she is able to get her compression hose on  now.  States she has not used her pump because she is afraid of it.  Urged pt to use the pump from now until she is seen on Monday.  Pt went without her compression garments to "see what would happen" and almost immediatley noted increased edema.  Therapist urged to wear compression garment as instructed; ie put on immediately when she gets up.  Take off right before showering in the night.  Pt remeasured and as expected volume has gone up due to going without compression.  Wound continues to approximate.  Pt LE measured to allow pt to call outlet center and order more compression garments.      Rehab Potential  Good    PT Frequency  3x / week    PT Duration  6 weeks    PT Treatment/Interventions  Patient/family education;Manual techniques;Manual lymph drainage;Compression bandaging    PT Next Visit Plan  Measure Monday to see if pump has benefited volume.  Answer any questions and discharge.      Consulted and Agree with Plan of Care  Patient       Patient will benefit from skilled therapeutic intervention in order to improve the following deficits and impairments:  Cardiopulmonary status limiting activity, Decreased activity tolerance, Decreased strength, Difficulty walking, Pain, Increased edema, Other (comment)(open wounds)  Visit Diagnosis: Lymphedema, not elsewhere classified  Open wound of left lower leg with complication, sequela     Problem List Patient Active Problem List   Diagnosis Date Noted  . PMB (postmenopausal bleeding) 10/06/2016  . Chest pressure 01/05/2015  . Gastric erosion   . Encounter for screening colonoscopy 01/22/2014  . Constipation 01/20/2014  . GERD (gastroesophageal reflux disease) 01/20/2014  . Dyspepsia 01/20/2014  . SVT (supraventricular tachycardia) (HCC) 05/31/2013  . HTN (hypertension), benign 05/31/2013  . Hypercholesteremia 05/31/2013  . COPD (chronic obstructive pulmonary disease) (HCC) 05/31/2013  . Cervical stenosis of spine 05/31/2013  .  Peripheral neuropathy 05/31/2013    Virgina Organ, PT CLT [phone] 09/06/2017, 4:25 PM   [facility] [address] [place], [place], [address] Phone: [phone]   Fax:  [phone]  Name: Gina Bradley MRN: [id number] Date of Birth: 11/06/1960

## 2017-09-07 ENCOUNTER — Other Ambulatory Visit: Payer: Self-pay | Admitting: Internal Medicine

## 2017-09-11 ENCOUNTER — Telehealth (HOSPITAL_COMMUNITY): Payer: Self-pay | Admitting: Internal Medicine

## 2017-09-11 ENCOUNTER — Ambulatory Visit (HOSPITAL_COMMUNITY): Payer: Medicare Other | Admitting: Physical Therapy

## 2017-09-11 ENCOUNTER — Telehealth (HOSPITAL_COMMUNITY): Payer: Self-pay | Admitting: Physical Therapy

## 2017-09-11 NOTE — Telephone Encounter (Signed)
Called pt re no show.  Pt states that she called and cancelled due to no transportation but I have no record of this.  Therapist cancelled all other treatments besides Wednesday per policy.  Therapist had been expecting to discharge pt this week anyhow.  Gina Bradley, PT CLT [phone]

## 2017-09-11 NOTE — Telephone Encounter (Signed)
09/11/17  Pt left a message at 11:11 that she thought she had an appointment today but couldn't come because of transportation

## 2017-09-13 ENCOUNTER — Ambulatory Visit (HOSPITAL_COMMUNITY): Payer: Medicare Other | Admitting: Physical Therapy

## 2017-09-13 DIAGNOSIS — Z9889 Other specified postprocedural states: Secondary | ICD-10-CM | POA: Diagnosis not present

## 2017-09-13 DIAGNOSIS — I890 Lymphedema, not elsewhere classified: Secondary | ICD-10-CM | POA: Diagnosis not present

## 2017-09-13 DIAGNOSIS — E119 Type 2 diabetes mellitus without complications: Secondary | ICD-10-CM | POA: Diagnosis not present

## 2017-09-13 DIAGNOSIS — S81802S Unspecified open wound, left lower leg, sequela: Secondary | ICD-10-CM

## 2017-09-13 DIAGNOSIS — I509 Heart failure, unspecified: Secondary | ICD-10-CM | POA: Diagnosis not present

## 2017-09-13 DIAGNOSIS — J449 Chronic obstructive pulmonary disease, unspecified: Secondary | ICD-10-CM | POA: Diagnosis not present

## 2017-09-13 DIAGNOSIS — I110 Hypertensive heart disease with heart failure: Secondary | ICD-10-CM | POA: Diagnosis not present

## 2017-09-13 NOTE — Therapy (Signed)
[place], [place], [address] Phone: [phone]   Fax:  [phone]  Physical Therapy Treatment  Patient Details  Name: Gina Bradley MRN: [id number] Date of Birth: [birth year] Referring Provider: Cristopher Peru   Encounter Date: 09/13/2017  PT End of Session - 09/13/17 1147    Visit Number  24    Number of Visits  24    Date for PT Re-Evaluation  09/19/17    Authorization Type   recert to 3/64 visit limit 30    Authorization - Visit Number  24    Authorization - Number of Visits  24    PT Start Time  1100    PT Stop Time  1145    PT Time Calculation (min)  45 min    Activity Tolerance  Patient tolerated treatment well    Behavior During Therapy  [facility] for tasks assessed/performed       Past Medical History:  Diagnosis Date  . Anemia   . Cervical disc disease   . COPD (chronic obstructive pulmonary disease) ([place])   . Diabetes mellitus without complication ([place])   . Essential hypertension   . GERD (gastroesophageal reflux disease)   . Heart failure ([place]) 2018  . History of cardiac catheterization    Normal coronaries March 2015  . History of non-ST elevation myocardial infarction (NSTEMI)    Secondary to SVT  . Peripheral neuropathy   . Spinal stenosis   . SVT (supraventricular tachycardia) ([facility])     Past Surgical History:  Procedure Laterality Date  . CESAREAN SECTION    . ESOPHAGOGASTRODUODENOSCOPY N/A 02/10/2014   WOE:[id number] tiny antral erosion; otherwise normal EGD. No explanation for patient's symptoms. Gallbladder needs further evaluation.  [name] LEFT HEART CATHETERIZATION WITH CORONARY ANGIOGRAM N/A 06/03/2013   Procedure: LEFT HEART CATHETERIZATION WITH CORONARY ANGIOGRAM;  Surgeon: Sinclair Grooms, MD;  Location: [facility] CATH LAB;  Service: Cardiovascular;  Laterality: N/A;    There were no vitals filed for this visit.  Subjective Assessment - 09/13/17 1105    Subjective  PT 30 minutes late to  appointment.  PT states that she has used her pump.  She has gotten her petite garments which fit her much better than the normal length.  She has found abdominal compression.       How long can you sit comfortably?  no problem    How long can you stand comfortably?  an hour     How long can you walk comfortably?  an hour     Currently in Pain?  No/denies         [facility] PT Assessment - 09/13/17 0001      Assessment   Medical Diagnosis  B LE Lymphedema; head and neck lymphedema     Referring Provider  Cristopher Peru    Next MD Visit  06/03/2018    Prior Therapy  none       Precautions   Precautions  None      Balance Screen   Has the patient fallen in the past 6 months  No    Has the patient had a decrease in activity level because of a fear of falling?   No    Is the patient reluctant to leave their home because of a fear of falling?   No        LYMPHEDEMA/ONCOLOGY QUESTIONNAIRE - 09/13/17 1114      Right Lower Extremity Lymphedema   20 cm  Proximal to Suprapatella  68.8 cm was 72    10 cm Proximal to Suprapatella  57 cm was 61.2    At Midpatella/Popliteal Crease  41 cm was 45    30 cm Proximal to Floor at Lateral Plantar Foot  42.8 cm was 43    20 cm Proximal to Floor at Lateral Plantar Foot  38 1 was 37    10 cm Proximal to Floor at Lateral Malleoli  26 cm 30.3    Circumference of ankle/heel  33 cm. was 35.2    5 cm Proximal to 1st MTP Joint  26.8 cm was 28.2    Across MTP Joint  23.5 cm was 25.7      Left Lower Extremity Lymphedema   20 cm Proximal to Suprapatella  71.8 cm was 74 at the highest     10 cm Proximal to Suprapatella  60.8 cm was 63.5 at the highest     At Midpatella/Popliteal Crease  44 cm was45.4    30 cm Proximal to Floor at Lateral Plantar Foot  43 cm was 44.5 at the highest    20 cm Proximal to Floor at Lateral Plantar Foot  36 cm    10 cm Proximal to Floor at Lateral Malleoli  27 cm was 28.8    Circumference of ankle/heel  33.2 cm. was34.5    5 cm  Proximal to 1st MTP Joint  26 cm was 27.3    Across MTP Joint  24.5 cm was 24.8    Other  wound .2 x.4 pt feels comfortable with self treatement.                 [place] Adult PT Treatment/Exercise - 09/13/17 0001      Manual Therapy   Other Manual Therapy  measured wond and volume;Lt dorsal foot wound 0.4X.8 X 0.2 cm.  Wound bladed followed by xeroform prior to donning compression hose. [name]             PT Education - 09/13/17 1146    Education provided  Yes    Education Details  cleanse wound daily, place medihoney and bandaid until it is healed, complete manual on neck/use compression pump on LE every day, wear compression garments everyday.    Person(s) Educated  Patient    Methods  Explanation    Comprehension  Verbalized understanding       PT Short Term Goals - 09/13/17 1149      PT SHORT TERM GOAL #1   Title  PT woundson B LE  to have stopped weeping to decrease risk of infection     Time  1    Period  Weeks    Status  Achieved      PT SHORT TERM GOAL #2   Title  PT wounds on B LE  to be 100% granulated     Baseline  initial wound are healed but pt developed new wounds which have not totally healed.     Time  2    Period  Weeks    Status  Partially Met initial wounds healed but pt had self inflicted wound on Lt dorsal forefoot now .2x.4 cm       PT SHORT TERM GOAL #3   Title  PT volume to be decreased by 1.5 cm to allow pt to lift legs onto the beds with greater ease.     Time  2    Period  Weeks    Status  Achieved  PT Long Term Goals - 09/13/17 1150      PT LONG TERM GOAL #1   Title  Pt volume to be decreased by 3 cm to allow pt to don and doff clothes easier.     Time  6    Period  Weeks    Status  Achieved      PT LONG TERM GOAL #2   Title  PT wounds to be healed to decrease risk of infection     Baseline  as stated before inital wounds have healed but pt has new wounds which developed the week of 07/23/2017    Time  4    Period   Weeks    Status  Partially Met      PT LONG TERM GOAL #3   Title  Pt pain in LE to be no greater than a 3/10 to allow walking to be easier ( pt to be able to walk for five-10 minute without leg pain) . Pt is also limited due to her COPd    Status  Achieved      PT LONG TERM GOAL #4   Title  PT to have and be using compression pump and garment    Time  8    Period  Weeks    Status  Achieved            Plan - 09/13/17 1148    Clinical Impression Statement  Pt comes into department with compression garments donned.  She has used and really likes the compression pump.  She has bought abdominal compression but has not recieved it yet.  Pt feels confident with wound care as well as donning and doffing her compression garments and is ready for discharge.    Rehab Potential  Good    PT Frequency  3x / week    PT Duration  6 weeks    PT Treatment/Interventions  Patient/family education;Manual techniques;Manual lymph drainage;Compression bandaging    PT Next Visit Plan  Discharge    Consulted and Agree with Plan of Care  Patient       Patient will benefit from skilled therapeutic intervention in order to improve the following deficits and impairments:  Cardiopulmonary status limiting activity, Decreased activity tolerance, Decreased strength, Difficulty walking, Pain, Increased edema, Other (comment)(open wounds)  Visit Diagnosis: Lymphedema, not elsewhere classified  Open wound of left lower leg with complication, sequela     Problem List Patient Active Problem List   Diagnosis Date Noted  . PMB (postmenopausal bleeding) 10/06/2016  . Chest pressure 01/05/2015  . Gastric erosion   . Encounter for screening colonoscopy 01/22/2014  . Constipation 01/20/2014  . GERD (gastroesophageal reflux disease) 01/20/2014  . Dyspepsia 01/20/2014  . SVT (supraventricular tachycardia) ([place]) 05/31/2013  . HTN (hypertension), benign 05/31/2013  . Hypercholesteremia 05/31/2013  . COPD  (chronic obstructive pulmonary disease) ([place]) 05/31/2013  . Cervical stenosis of spine 05/31/2013  . Peripheral neuropathy 05/31/2013    Rayetta Humphrey, PT CLT [phone] 09/13/2017, 11:52 AM  [place] [address] [place], [place], [address] Phone: [phone]   Fax:  [phone]  Name: Gina Bradley MRN: [id number] Date of Birth: Dec 28, 1960  PHYSICAL THERAPY DISCHARGE SUMMARY  Visits from Start of Care: 24  Current functional level related to goals / functional outcomes: See above    Remaining deficits: See above   Education / Equipment: Exercise, self manual, strategies to control lymphedema  Plan: Patient agrees to discharge.  Patient  goals were met. Patient is being discharged due to meeting the stated rehab goals.  ?????        Rayetta Humphrey, [place] CLT [phone]

## 2017-09-14 ENCOUNTER — Encounter (HOSPITAL_COMMUNITY): Payer: Medicare Other | Admitting: Physical Therapy

## 2017-09-18 ENCOUNTER — Encounter (HOSPITAL_COMMUNITY): Payer: Medicare Other | Admitting: Physical Therapy

## 2017-09-20 ENCOUNTER — Encounter (HOSPITAL_COMMUNITY): Payer: Medicare Other | Admitting: Physical Therapy

## 2017-09-21 ENCOUNTER — Encounter (HOSPITAL_COMMUNITY): Payer: Medicare Other | Admitting: Physical Therapy

## 2017-09-25 ENCOUNTER — Encounter (HOSPITAL_COMMUNITY): Payer: Medicare Other | Admitting: Physical Therapy

## 2017-09-27 ENCOUNTER — Encounter (HOSPITAL_COMMUNITY): Payer: Medicare Other | Admitting: Physical Therapy

## 2017-09-28 ENCOUNTER — Encounter (HOSPITAL_COMMUNITY): Payer: Medicare Other | Admitting: Physical Therapy

## 2017-10-06 DIAGNOSIS — G4733 Obstructive sleep apnea (adult) (pediatric): Secondary | ICD-10-CM | POA: Diagnosis not present

## 2017-10-25 ENCOUNTER — Ambulatory Visit: Payer: Medicare Other | Admitting: Urology

## 2017-10-26 DIAGNOSIS — E782 Mixed hyperlipidemia: Secondary | ICD-10-CM | POA: Diagnosis not present

## 2017-10-26 DIAGNOSIS — E119 Type 2 diabetes mellitus without complications: Secondary | ICD-10-CM | POA: Diagnosis not present

## 2017-10-26 DIAGNOSIS — Z0001 Encounter for general adult medical examination with abnormal findings: Secondary | ICD-10-CM | POA: Diagnosis not present

## 2017-10-26 DIAGNOSIS — I10 Essential (primary) hypertension: Secondary | ICD-10-CM | POA: Diagnosis not present

## 2017-10-26 DIAGNOSIS — L309 Dermatitis, unspecified: Secondary | ICD-10-CM | POA: Diagnosis not present

## 2017-10-28 ENCOUNTER — Other Ambulatory Visit: Payer: Self-pay | Admitting: Adult Health

## 2017-11-06 DIAGNOSIS — G4733 Obstructive sleep apnea (adult) (pediatric): Secondary | ICD-10-CM | POA: Diagnosis not present

## 2017-12-01 DIAGNOSIS — H524 Presbyopia: Secondary | ICD-10-CM | POA: Diagnosis not present

## 2017-12-01 DIAGNOSIS — H52223 Regular astigmatism, bilateral: Secondary | ICD-10-CM | POA: Diagnosis not present

## 2017-12-01 DIAGNOSIS — E119 Type 2 diabetes mellitus without complications: Secondary | ICD-10-CM | POA: Diagnosis not present

## 2017-12-07 ENCOUNTER — Other Ambulatory Visit (HOSPITAL_COMMUNITY): Payer: Self-pay | Admitting: Internal Medicine

## 2017-12-07 ENCOUNTER — Ambulatory Visit: Payer: BLUE CROSS/BLUE SHIELD | Admitting: Obstetrics and Gynecology

## 2017-12-07 DIAGNOSIS — Z1231 Encounter for screening mammogram for malignant neoplasm of breast: Secondary | ICD-10-CM

## 2017-12-07 DIAGNOSIS — G4733 Obstructive sleep apnea (adult) (pediatric): Secondary | ICD-10-CM | POA: Diagnosis not present

## 2017-12-14 ENCOUNTER — Ambulatory Visit (HOSPITAL_COMMUNITY): Payer: Medicare Other

## 2017-12-25 ENCOUNTER — Ambulatory Visit (INDEPENDENT_AMBULATORY_CARE_PROVIDER_SITE_OTHER): Payer: Medicare Other | Admitting: Otolaryngology

## 2018-01-04 DIAGNOSIS — I10 Essential (primary) hypertension: Secondary | ICD-10-CM | POA: Diagnosis not present

## 2018-01-04 DIAGNOSIS — Z1389 Encounter for screening for other disorder: Secondary | ICD-10-CM | POA: Diagnosis not present

## 2018-01-04 DIAGNOSIS — K219 Gastro-esophageal reflux disease without esophagitis: Secondary | ICD-10-CM | POA: Diagnosis not present

## 2018-01-04 DIAGNOSIS — G894 Chronic pain syndrome: Secondary | ICD-10-CM | POA: Diagnosis not present

## 2018-01-06 DIAGNOSIS — G4733 Obstructive sleep apnea (adult) (pediatric): Secondary | ICD-10-CM | POA: Diagnosis not present

## 2018-02-05 ENCOUNTER — Ambulatory Visit: Payer: Medicare Other | Admitting: Obstetrics and Gynecology

## 2018-02-06 DIAGNOSIS — G4733 Obstructive sleep apnea (adult) (pediatric): Secondary | ICD-10-CM | POA: Diagnosis not present

## 2018-02-07 ENCOUNTER — Other Ambulatory Visit: Payer: Self-pay

## 2018-02-07 ENCOUNTER — Emergency Department (HOSPITAL_COMMUNITY)
Admission: EM | Admit: 2018-02-07 | Discharge: 2018-02-07 | Disposition: A | Discharge status: Home / Self Care | Payer: Medicare Other | Attending: Emergency Medicine | Admitting: Emergency Medicine

## 2018-02-07 ENCOUNTER — Encounter (HOSPITAL_COMMUNITY): Payer: Self-pay | Admitting: Emergency Medicine

## 2018-02-07 DIAGNOSIS — F17210 Nicotine dependence, cigarettes, uncomplicated: Secondary | ICD-10-CM | POA: Insufficient documentation

## 2018-02-07 DIAGNOSIS — I10 Essential (primary) hypertension: Secondary | ICD-10-CM | POA: Diagnosis not present

## 2018-02-07 DIAGNOSIS — W268XXA Contact with other sharp object(s), not elsewhere classified, initial encounter: Secondary | ICD-10-CM | POA: Diagnosis not present

## 2018-02-07 DIAGNOSIS — Z79899 Other long term (current) drug therapy: Secondary | ICD-10-CM | POA: Insufficient documentation

## 2018-02-07 DIAGNOSIS — Y929 Unspecified place or not applicable: Secondary | ICD-10-CM | POA: Diagnosis not present

## 2018-02-07 DIAGNOSIS — J449 Chronic obstructive pulmonary disease, unspecified: Secondary | ICD-10-CM | POA: Diagnosis not present

## 2018-02-07 DIAGNOSIS — E119 Type 2 diabetes mellitus without complications: Secondary | ICD-10-CM | POA: Insufficient documentation

## 2018-02-07 DIAGNOSIS — Z7984 Long term (current) use of oral hypoglycemic drugs: Secondary | ICD-10-CM | POA: Diagnosis not present

## 2018-02-07 DIAGNOSIS — Y939 Activity, unspecified: Secondary | ICD-10-CM | POA: Insufficient documentation

## 2018-02-07 DIAGNOSIS — Y999 Unspecified external cause status: Secondary | ICD-10-CM | POA: Insufficient documentation

## 2018-02-07 DIAGNOSIS — Z7982 Long term (current) use of aspirin: Secondary | ICD-10-CM | POA: Insufficient documentation

## 2018-02-07 DIAGNOSIS — S81812A Laceration without foreign body, left lower leg, initial encounter: Secondary | ICD-10-CM | POA: Diagnosis not present

## 2018-02-07 HISTORY — DX: Lymphedema, not elsewhere classified: I89.0

## 2018-02-07 LAB — CBG MONITORING, ED: Glucose-Capillary: 99 mg/dL (ref 70–99)

## 2018-02-07 NOTE — ED Triage Notes (Signed)
Patient states she cut her left shin on a piece of wood today. Laceration noted to left lower leg.

## 2018-02-07 NOTE — ED Provider Notes (Signed)
[facility] EMERGENCY DEPARTMENT Provider Note   CSN: [id number] Arrival date & time: 02/07/18  1644     History   Chief Complaint Chief Complaint  Patient presents with  . Laceration    HPI Gina Bradley is a 57 y.o. female.  The history is provided by the patient. No language interpreter was used.  Laceration   The incident occurred less than 1 hour ago. The laceration is located on the left leg. The laceration is 2 cm in size. Injury mechanism: cut on a peice of wood. The pain is moderate. The pain has been constant since onset. She reports no foreign bodies present. Her tetanus status is UTD.    Past Medical History:  Diagnosis Date  . Anemia   . Cervical disc disease   . COPD (chronic obstructive pulmonary disease) ([facility])   . Diabetes mellitus without complication ([facility])   . Essential hypertension   . GERD (gastroesophageal reflux disease)   . Heart failure ([facility]) 2018  . History of cardiac catheterization    Normal coronaries March 2015  . History of non-ST elevation myocardial infarction (NSTEMI)    Secondary to SVT  . Lymphedema   . Peripheral neuropathy   . Spinal stenosis   . SVT (supraventricular tachycardia) [facility])     Patient Active Problem List   Diagnosis Date Noted  . PMB (postmenopausal bleeding) 10/06/2016  . Chest pressure 01/05/2015  . Gastric erosion   . Encounter for screening colonoscopy 01/22/2014  . Constipation 01/20/2014  . GERD (gastroesophageal reflux disease) 01/20/2014  . Dyspepsia 01/20/2014  . SVT (supraventricular tachycardia) (H[facility]) 05/31/2013  . HTN (hypertension), benign 05/31/2013  . Hypercholesteremia 05/31/2013  . COPD (chronic obstructive pulmonary disease) ([facility]) 05/31/2013  . Cervical stenosis of spine 05/31/2013  . Peripheral neuropathy 05/31/2013    Past Surgical History:  Procedure Laterality Date  . CESAREAN SECTION    . ESOPHAGOGASTRODUODENOSCOPY N/A 02/10/2014   GEX:BMWUXL tiny antral erosion; otherwise normal  EGD. No explanation for patient's symptoms. Gallbladder needs further evaluation.  [name] LEFT HEART CATHETERIZATION WITH CORONARY ANGIOGRAM N/A 06/03/2013   Procedure: LEFT HEART CATHETERIZATION WITH CORONARY ANGIOGRAM;  Surgeon: Lesleigh Noe, MD;  Location: [facility] CATH LAB;  Service: Cardiovascular;  Laterality: N/A;     OB History    Gravida  2   Para  2   Term  2   Preterm      AB      Living  2     SAB      TAB      Ectopic      Multiple      Live Births  2            Home Medications    Prior to Admission medications   Medication Sig Start Date End Date Taking? Authorizing Provider  albuterol (PROVENTIL HFA;VENTOLIN HFA) 108 (90 Base) MCG/ACT inhaler Inhale into the lungs every 6 (six) hours as needed for wheezing or shortness of breath.    [provider]  albuterol (PROVENTIL) (2.5 MG/3ML) 0.083% nebulizer solution Take 2.5 mg by nebulization 4 (four) times daily.    [provider]  ALPRAZolam Prudy Feeler) 1 MG tablet Take 1 mg by mouth 3 (three) times daily as needed for anxiety or sleep. Anxiety 05/03/13   [provider]  ANORO ELLIPTA 62.5-25 MCG/INH AEPB Inhale 1 puff into the lungs daily.  09/09/16   [provider]  aspirin EC 81 MG EC tablet Take 1 tablet (  81 mg total) by mouth daily. 06/03/13   Lonia Blood, MD  atorvastatin (LIPITOR) 20 MG tablet Take 20 mg by mouth every evening.  03/04/13   [provider]  co-enzyme Q-10 30 MG capsule Take 100 mg by mouth daily.    [provider]  Compression Bandages MISC 1 each by Does not apply route as directed. 05/31/17   Marinus Maw, MD  cyclobenzaprine (FLEXERIL) 10 MG tablet Take 10 mg by mouth at bedtime as needed.  08/30/16   [provider]  EPINEPHrine (EPIPEN 2-PAK) 0.3 mg/0.3 mL IJ SOAJ injection Inject 0.3 mLs (0.3 mg total) into the muscle once as needed (for severe allergic reaction). CAll 911 immediately if you have to use this medicine  10/21/15   Eber Hong, MD  furosemide (LASIX) 20 MG tablet TAKE (1) TABLET BY MOUTH 2 TIMES DAILY AS NEEDED. 09/07/17   Marinus Maw, MD  gabapentin (NEURONTIN) 100 MG capsule Take 100 mg by mouth 3 (three) times daily. 05/25/13   [provider]  HYDROcodone-acetaminophen (NORCO) 10-325 MG per tablet Take 1 tablet by mouth every 6 (six) hours as needed. pain 05/03/13   [provider]  ibuprofen (ADVIL,MOTRIN) 600 MG tablet Take 1 tablet (600 mg total) by mouth 4 (four) times daily. 08/23/17   Ivery Quale, PA-C  Investigational vitamin D 600 UNITS capsule SWOG [id number] Take 600 Units by mouth daily. Take with food.    [provider]  metFORMIN (GLUCOPHAGE) 500 MG tablet Take 500 mg by mouth as needed. 11/30/16   [provider]  methocarbamol (ROBAXIN) 500 MG tablet Take 1 tablet (500 mg total) by mouth 3 (three) times daily. 08/23/17   Ivery Quale, PA-C  metoprolol succinate (TOPROL-XL) 100 MG 24 hr tablet TAKE (1) TABLET BY MOUTH ONCE DAILY. 10/30/17   Jodelle Gross, NP  nitroGLYCERIN (NITROSTAT) 0.4 MG SL tablet Place 1 tablet (0.4 mg total) under the tongue every 5 (five) minutes as needed for chest pain. 12/22/14   Rolland Porter, MD  Oxycodone HCl 10 MG TABS Take 5-10 mg by mouth daily as needed (for pain).  09/06/16   [provider]  pantoprazole (PROTONIX) 40 MG tablet Take 40 mg by mouth 2 (two) times daily. 05/25/13   [provider]  polyethylene glycol-electrolytes (TRILYTE) 420 g solution Take 4,000 mLs by mouth as directed. 12/27/16   Rourk, Gerrit Friends, MD  promethazine (PHENERGAN) 25 MG tablet Take 25 mg by mouth every 6 (six) hours as needed for nausea or vomiting.    [provider]  TOVIAZ 4 MG TB24 tablet Take 4 mg by mouth daily.  09/21/16   [provider]  traMADol Janean Sark) 50 MG tablet 1 or 2 po q6h prn pain 08/23/17   Ivery Quale, PA-C  triamcinolone cream (KENALOG) 0.1 % Apply 1 application topically as  needed (ITCHING/IRRITATION).  04/28/15   [provider]  VIT B6-VIT B12-OMEGA 3 ACIDS PO Take 500 mg by mouth daily.    [provider]    Family History Family History  Problem Relation Age of Onset  . Heart attack Mother   . Diabetes Mother   . Hypertension Mother   . Angina Mother   . Diabetes Father   . Hypertension Father   . Heart failure Brother   . Suicidality Sister   . Colon cancer Unknown        Possibly Dad   . Stroke Maternal Grandmother   . Heart failure  Maternal Grandmother   . Diabetes Maternal Grandfather   . Hypertension Daughter   . Hypertension Son     Social History Social History   Tobacco Use  . Smoking status: Current Every Day Smoker    Packs/day: 0.50    Years: 18.00    Pack years: 9.00    Types: Cigarettes  . Smokeless tobacco: Never Used  Substance Use Topics  . Alcohol use: No    Alcohol/week: 0.0 standard drinks  . Drug use: No     Allergies   Bee venom and Tdap [tetanus-diphth-acell pertussis]   Review of Systems Review of Systems  All other systems reviewed and are negative.    Physical Exam Updated Vital Signs BP (!) 114/93 (BP Location: Right Arm)   Pulse 79   Temp (!) 97.4 F (36.3 C) (Oral)   Resp 16   Ht 5\' 1"  (1.549 m)   Wt 86.2 kg   LMP 01/05/2015   SpO2 96%   BMI 35.90 kg/m   Physical Exam  Constitutional: She appears well-developed and well-nourished.  HENT:  Head: Normocephalic.  Cardiovascular: Normal rate.  Pulmonary/Chest: Effort normal.  Musculoskeletal:  2cm skin tear left mid shin. Lymphadema  bilat legs   Neurological: She is alert.  Skin: Skin is warm.  Psychiatric: She has a normal mood and affect.  Nursing note and vitals reviewed.    ED Treatments / Results  Labs (all labs ordered are listed, but only abnormal results are displayed) Labs Reviewed  CBG MONITORING, ED    EKG None  Radiology No results found.  Procedures .Marland KitchenLaceration Repair Date/Time:  02/07/2018 11:31 PM Performed by: Elson Areas, PA-C Authorized by: Elson Areas, PA-C   Consent:    Consent obtained:  Verbal   Consent given by:  Patient   Risks discussed:  Infection and poor wound healing   Alternatives discussed:  No treatment Anesthesia (see MAR for exact dosages):    Anesthesia method:  None Laceration details:    Location:  Leg   Leg location:  L lower leg   Length (cm):  2 Repair type:    Repair type:  Simple Pre-procedure details:    Preparation:  Patient was prepped and draped in usual sterile fashion Treatment:    Area cleansed with:  Betadine   Amount of cleaning:  Standard   Irrigation solution:  Sterile saline Skin repair:    Repair method:  Tissue adhesive Approximation:    Approximation:  Close Post-procedure details:    Patient tolerance of procedure:  Tolerated well, no immediate complications   (including critical care time)  Medications Ordered in ED Medications - No data to display   Initial Impression / Assessment and Plan / ED Course  I have reviewed the triage vital signs and the nursing notes.  Pertinent labs & imaging results that were available during my care of the patient were reviewed by me and considered in my medical decision making (see chart for details).     MDM  Pt has thin skin with lymphadena,  I suspect sutures will pull through skin,   Dermabond used.  Pt advised to keep legs elevated and wrapped as usual.  Pt advised of risk wound will reopen  Final Clinical Impressions(s) / ED Diagnoses   Final diagnoses:  Laceration of left lower extremity, initial encounter    ED Discharge Orders    None    An After Visit Summary was printed and given to the patient.   [place],  Lonia Skinner, PA-C 02/07/18 2333    Bethann Berkshire, MD 02/08/18 0021

## 2018-02-07 NOTE — Discharge Instructions (Addendum)
Return if any problems.

## 2018-02-07 NOTE — ED Notes (Signed)
derma bond at bedside

## 2018-02-21 ENCOUNTER — Other Ambulatory Visit: Payer: Self-pay

## 2018-02-21 NOTE — Patient Outreach (Signed)
[facility] Care Management  02/21/2018  Gina Bradley 1960/12/12 [id number]   Medication Adherence call to Mrs. Gina Bradley left a message for patient to call back patient is due on Metformin 500 mg. Mrs. Gina Bradley is showing past due [facility].   Gina AbedAna Bradley CPhT Pharmacy Technician [facility] Care Management Direct Dial [phone]  Fax [phone] [email or web address]

## 2018-04-02 DIAGNOSIS — I10 Essential (primary) hypertension: Secondary | ICD-10-CM | POA: Diagnosis not present

## 2018-04-02 DIAGNOSIS — G894 Chronic pain syndrome: Secondary | ICD-10-CM | POA: Diagnosis not present

## 2018-04-02 DIAGNOSIS — J449 Chronic obstructive pulmonary disease, unspecified: Secondary | ICD-10-CM | POA: Diagnosis not present

## 2018-04-02 DIAGNOSIS — J329 Chronic sinusitis, unspecified: Secondary | ICD-10-CM | POA: Diagnosis not present

## 2018-04-02 DIAGNOSIS — B351 Tinea unguium: Secondary | ICD-10-CM | POA: Diagnosis not present

## 2018-04-16 DIAGNOSIS — I10 Essential (primary) hypertension: Secondary | ICD-10-CM | POA: Diagnosis not present

## 2018-04-16 DIAGNOSIS — J329 Chronic sinusitis, unspecified: Secondary | ICD-10-CM | POA: Diagnosis not present

## 2018-04-16 DIAGNOSIS — Z0001 Encounter for general adult medical examination with abnormal findings: Secondary | ICD-10-CM | POA: Diagnosis not present

## 2018-04-16 DIAGNOSIS — E1142 Type 2 diabetes mellitus with diabetic polyneuropathy: Secondary | ICD-10-CM | POA: Diagnosis not present

## 2018-04-16 DIAGNOSIS — J449 Chronic obstructive pulmonary disease, unspecified: Secondary | ICD-10-CM | POA: Diagnosis not present

## 2018-04-18 ENCOUNTER — Ambulatory Visit (HOSPITAL_COMMUNITY): Payer: Medicare Other

## 2018-04-18 DIAGNOSIS — K210 Gastro-esophageal reflux disease with esophagitis: Secondary | ICD-10-CM | POA: Diagnosis not present

## 2018-04-18 DIAGNOSIS — Z23 Encounter for immunization: Secondary | ICD-10-CM | POA: Diagnosis not present

## 2018-04-18 DIAGNOSIS — J449 Chronic obstructive pulmonary disease, unspecified: Secondary | ICD-10-CM | POA: Diagnosis not present

## 2018-04-18 DIAGNOSIS — I10 Essential (primary) hypertension: Secondary | ICD-10-CM | POA: Diagnosis not present

## 2018-04-30 ENCOUNTER — Ambulatory Visit (INDEPENDENT_AMBULATORY_CARE_PROVIDER_SITE_OTHER): Payer: Medicare Other | Admitting: Otolaryngology

## 2018-05-02 DIAGNOSIS — J441 Chronic obstructive pulmonary disease with (acute) exacerbation: Secondary | ICD-10-CM | POA: Diagnosis not present

## 2018-05-02 DIAGNOSIS — R079 Chest pain, unspecified: Secondary | ICD-10-CM | POA: Diagnosis not present

## 2018-05-02 DIAGNOSIS — I10 Essential (primary) hypertension: Secondary | ICD-10-CM | POA: Diagnosis not present

## 2018-05-03 ENCOUNTER — Ambulatory Visit (HOSPITAL_COMMUNITY): Payer: Medicare Other

## 2018-05-10 ENCOUNTER — Encounter: Payer: Self-pay | Admitting: Internal Medicine

## 2018-05-10 ENCOUNTER — Ambulatory Visit (INDEPENDENT_AMBULATORY_CARE_PROVIDER_SITE_OTHER): Payer: Medicare Other | Admitting: Internal Medicine

## 2018-05-10 VITALS — BP 128/84 | HR 70 | Ht 61.0 in | Wt 205.0 lb

## 2018-05-10 DIAGNOSIS — I471 Supraventricular tachycardia: Secondary | ICD-10-CM

## 2018-05-10 NOTE — Patient Instructions (Signed)
Medication Instructions:  Your physician recommends that you continue on your current medications as directed. Please refer to the Current Medication list given to you today.  If you need a refill on your cardiac medications before your next appointment, please call your pharmacy.   Lab work: NONE   If you have labs (blood work) drawn today and your tests are completely normal, you will receive your results only by: . MyChart Message (if you have MyChart) OR . A paper copy in the mail If you have any lab test that is abnormal or we need to change your treatment, we will call you to review the results.  Testing/Procedures: NONE   Follow-Up: At [facility], you and your health needs are our priority.  As part of our continuing mission to provide you with exceptional heart care, we have created designated Provider Care Teams.  These Care Teams include your primary Cardiologist (physician) and Advanced Practice Providers (APPs -  Physician Assistants and Nurse Practitioners) who all work together to provide you with the care you need, when you need it. You will need a follow up appointment in 1 years.  Please call our office 2 months in advance to schedule this appointment.  You may see Gregg Taylor, MD or one of the following Advanced Practice Providers on your designated Care Team:   Amber Seiler, NP . Renee Ursuy, PA-C  Any Other Special Instructions Will Be Listed Below (If Applicable). Thank you for choosing [facility]!     

## 2018-05-10 NOTE — Progress Notes (Signed)
HPI Ms. Minasyan returns today for ongoing evaluation of SVT and chest pain. She is a pleasant middle aged woman with a h/o tachypalpitations and documented SVT. When I saw her last she had developed some lymphedema. She has been wearing support stockings. She has been under increased stress because of her family living situation. She has lost some weight.  Allergies  Allergen Reactions  . Bee Venom Other (See Comments)    Unknown  . Tdap [Tetanus-Diphth-Acell Pertussis] Other (See Comments)    Unknown     Current Outpatient Medications  Medication Sig Dispense Refill  . albuterol (PROVENTIL HFA;VENTOLIN HFA) 108 (90 Base) MCG/ACT inhaler Inhale into the lungs every 6 (six) hours as needed for wheezing or shortness of breath.    [name] albuterol (PROVENTIL) (2.5 MG/3ML) 0.083% nebulizer solution Take 2.5 mg by nebulization 4 (four) times daily.    [name] ELLIPTA 62.5-25 MCG/INH AEPB Inhale 1 puff into the lungs daily.   11  . aspirin EC 81 MG EC tablet Take 1 tablet (81 mg total) by mouth daily.    [name] atorvastatin (LIPITOR) 20 MG tablet Take 20 mg by mouth every evening.     [name] co-enzyme Q-10 30 MG capsule Take 100 mg by mouth daily.    [name] EPINEPHrine (EPIPEN 2-PAK) 0.3 mg/0.3 mL IJ SOAJ injection Inject 0.3 mLs (0.3 mg total) into the muscle once as needed (for severe allergic reaction). CAll 911 immediately if you have to use this medicine 2 Device 1  . furosemide (LASIX) 20 MG tablet Take 20 mg by mouth daily as needed.    . gabapentin (NEURONTIN) 100 MG capsule Take 100 mg by mouth 3 (three) times daily.    [name] HYDROcodone-acetaminophen (NORCO) 10-325 MG per tablet Take 1 tablet by mouth every 6 (six) hours as needed. pain    . ibuprofen (ADVIL,MOTRIN) 600 MG tablet Take 1 tablet (600 mg total) by mouth 4 (four) times daily. 30 tablet 0  . metFORMIN (GLUCOPHAGE) 500 MG tablet Take 500 mg by mouth as needed.  10  . methocarbamol (ROBAXIN) 500 MG tablet Take 1 tablet (500 mg total) by mouth  3 (three) times daily. 21 tablet 0  . metoprolol succinate (TOPROL-XL) 100 MG 24 hr tablet TAKE (1) TABLET BY MOUTH ONCE DAILY. 30 tablet 9  . nitroGLYCERIN (NITROSTAT) 0.4 MG SL tablet Place 1 tablet (0.4 mg total) under the tongue every 5 (five) minutes as needed for chest pain. 10 tablet 0  . Oxycodone HCl 10 MG TABS Take 5-10 mg by mouth daily as needed (for pain).   0  . pantoprazole (PROTONIX) 40 MG tablet Take 40 mg by mouth 2 (two) times daily.    . promethazine (PHENERGAN) 25 MG tablet Take 25 mg by mouth every 6 (six) hours as needed for nausea or vomiting.    . TOVIAZ 4 MG TB24 tablet Take 4 mg by mouth daily.   10  . triamcinolone cream (KENALOG) 0.1 % Apply 1 application topically as needed (ITCHING/IRRITATION).   2   No current facility-administered medications for this visit.      Past Medical History:  Diagnosis Date  . Anemia   . Cervical disc disease   . COPD (chronic obstructive pulmonary disease) (HCC)   . Diabetes mellitus without complication (HCC)   . Essential hypertension   . GERD (gastroesophageal reflux disease)   . Heart failure (HCC) 2018  . History of cardiac catheterization    Normal coronaries March  2015  . History of non-ST elevation myocardial infarction (NSTEMI)    Secondary to SVT  . Lymphedema   . Peripheral neuropathy   . Spinal stenosis   . SVT (supraventricular tachycardia) (HCC)     ROS:   All systems reviewed and negative except as noted in the HPI.   Past Surgical History:  Procedure Laterality Date  . CESAREAN SECTION    . ESOPHAGOGASTRODUODENOSCOPY N/A 02/10/2014   YDX:AJOINO tiny antral erosion; otherwise normal EGD. No explanation for patient's symptoms. Gallbladder needs further evaluation.  [name] LEFT HEART CATHETERIZATION WITH CORONARY ANGIOGRAM N/A 06/03/2013   Procedure: LEFT HEART CATHETERIZATION WITH CORONARY ANGIOGRAM;  Surgeon: Lesleigh Noe, MD;  Location: [facility] CATH LAB;  Service: Cardiovascular;  Laterality: N/A;      Family History  Problem Relation Age of Onset  . Heart attack Mother   . Diabetes Mother   . Hypertension Mother   . Angina Mother   . Diabetes Father   . Hypertension Father   . Heart failure Brother   . Suicidality Sister   . Colon cancer Other        Possibly Dad   . Stroke Maternal Grandmother   . Heart failure Maternal Grandmother   . Diabetes Maternal Grandfather   . Hypertension Daughter   . Hypertension Son      Social History   Socioeconomic History  . Marital status: Married    Spouse name: Not on file  . Number of children: Not on file  . Years of education: Not on file  . Highest education level: Not on file  Occupational History  . Occupation: [employer]    Employer: [employer]  Social Needs  . Financial resource strain: Not on file  . Food insecurity:    Worry: Not on file    Inability: Not on file  . Transportation needs:    Medical: Not on file    Non-medical: Not on file  Tobacco Use  . Smoking status: Current Every Day Smoker    Packs/day: 0.50    Years: 18.00    Pack years: 9.00    Types: Cigarettes  . Smokeless tobacco: Never Used  Substance and Sexual Activity  . Alcohol use: No    Alcohol/week: 0.0 standard drinks  . Drug use: No  . Sexual activity: Not Currently    Birth control/protection: Post-menopausal  Lifestyle  . Physical activity:    Days per week: Not on file    Minutes per session: Not on file  . Stress: Not on file  Relationships  . Social connections:    Talks on phone: Not on file    Gets together: Not on file    Attends religious service: Not on file    Active member of club or organization: Not on file    Attends meetings of clubs or organizations: Not on file    Relationship status: Not on file  . Intimate partner violence:    Fear of current or ex partner: Not on file    Emotionally abused: Not on file    Physically abused: Not on file    Forced sexual activity: Not on file  Other Topics Concern  .  Not on file  Social History Narrative  . Not on file     BP 128/84   Pulse 70   Ht 5\' 1"  (1.549 m)   Wt 205 lb (93 kg)   LMP 01/05/2015   SpO2 97%   BMI 38.73 kg/m  Physical Exam:  Well appearing NAD HEENT: Unremarkable Neck:  No JVD, no thyromegally Lymphatics:  No adenopathy Back:  No CVA tenderness Lungs:  Clear HEART:  Regular rate rhythm, no murmurs, no rubs, no clicks Abd:  soft, positive bowel sounds, no organomegally, no rebound, no guarding Ext:  2 plus pulses, no edema, no cyanosis, no clubbing Skin:  No rashes no nodules Neuro:  CN II through XII intact, motor grossly intact  EKG - nsr   Assess/Plan: 1. SVT - she has had no prolonged episodes. She will continue her current meds. 2. Obesity - she has lost 10 lbs and I have encouraged her to lose more. 3. Lymphadema - she is doing well with support stockings. 4. Chest pain - her symptoms are controlled and non-exertional. She will undergo watchful waiting.  Leonia ReevesGregg Taylor,M.D.

## 2018-05-17 ENCOUNTER — Ambulatory Visit (INDEPENDENT_AMBULATORY_CARE_PROVIDER_SITE_OTHER): Payer: Medicare Other | Admitting: Otolaryngology

## 2018-05-17 DIAGNOSIS — J310 Chronic rhinitis: Secondary | ICD-10-CM | POA: Diagnosis not present

## 2018-05-17 DIAGNOSIS — J343 Hypertrophy of nasal turbinates: Secondary | ICD-10-CM

## 2018-05-21 ENCOUNTER — Other Ambulatory Visit (HOSPITAL_COMMUNITY): Payer: Self-pay | Admitting: Otolaryngology

## 2018-05-21 DIAGNOSIS — J320 Chronic maxillary sinusitis: Secondary | ICD-10-CM

## 2018-05-23 ENCOUNTER — Ambulatory Visit: Payer: Medicare Other | Admitting: Urology

## 2018-05-30 DIAGNOSIS — J449 Chronic obstructive pulmonary disease, unspecified: Secondary | ICD-10-CM | POA: Diagnosis not present

## 2018-05-30 DIAGNOSIS — I10 Essential (primary) hypertension: Secondary | ICD-10-CM | POA: Diagnosis not present

## 2018-05-30 DIAGNOSIS — G894 Chronic pain syndrome: Secondary | ICD-10-CM | POA: Diagnosis not present

## 2018-05-30 DIAGNOSIS — E119 Type 2 diabetes mellitus without complications: Secondary | ICD-10-CM | POA: Diagnosis not present

## 2018-06-05 ENCOUNTER — Other Ambulatory Visit (HOSPITAL_COMMUNITY): Payer: Self-pay | Admitting: Internal Medicine

## 2018-06-05 ENCOUNTER — Telehealth: Payer: Self-pay | Admitting: Internal Medicine

## 2018-06-05 ENCOUNTER — Ambulatory Visit (HOSPITAL_COMMUNITY)
Admission: RE | Admit: 2018-06-05 | Discharge: 2018-06-05 | Disposition: A | Discharge status: Home / Self Care | Payer: Medicare Other | Source: Ambulatory Visit | Attending: Otolaryngology | Admitting: Otolaryngology

## 2018-06-05 DIAGNOSIS — J320 Chronic maxillary sinusitis: Secondary | ICD-10-CM | POA: Insufficient documentation

## 2018-06-05 DIAGNOSIS — I890 Lymphedema, not elsewhere classified: Secondary | ICD-10-CM

## 2018-06-05 LAB — POCT I-STAT CREATININE: Creatinine, Ser: 0.9 mg/dL (ref 0.44–1.00)

## 2018-06-05 MED ORDER — IOHEXOL 300 MG/ML  SOLN
75.0000 mL | Freq: Once | INTRAMUSCULAR | Status: AC | PRN
  Administered 2018-06-05: 75 mL via INTRAVENOUS

## 2018-06-05 NOTE — Telephone Encounter (Signed)
Pt would like for Dr. Ladona Ridgel to please send an order to the lymphedema clinic so they can work on her arms. He sent one for her legs

## 2018-06-07 NOTE — Telephone Encounter (Signed)
Olegario Messier and [place],  Can you verbal order this for me. I am not familiar with this clinic.  GT

## 2018-06-08 NOTE — Addendum Note (Signed)
Addended by: Kerney Elbe on: 06/08/2018 10:59 AM   Modules accepted: Orders

## 2018-06-08 NOTE — Telephone Encounter (Signed)
Order placed

## 2018-06-11 ENCOUNTER — Other Ambulatory Visit: Payer: Self-pay

## 2018-06-11 NOTE — Patient Outreach (Signed)
[facility]) Care Management  06/11/2018  Gina Bradley Oct 08, 1960 [id number]   Medication Adherence call to Gina Bradley left a message for patient to call back patient is due on Metformin 500mg . Gina Bradley is showing past due under [place] Health care Ins. [facility] said patient last pick up was on 04/11/18 for a 30 days supply   Lillia Abed CPhT Pharmacy Technician [facility] Management Direct Dial [phone]  Fax [phone] [email or web address]

## 2018-06-14 ENCOUNTER — Ambulatory Visit (INDEPENDENT_AMBULATORY_CARE_PROVIDER_SITE_OTHER): Payer: Medicare Other | Admitting: Otolaryngology

## 2018-06-14 DIAGNOSIS — J343 Hypertrophy of nasal turbinates: Secondary | ICD-10-CM | POA: Diagnosis not present

## 2018-06-27 ENCOUNTER — Ambulatory Visit: Payer: Medicare Other | Admitting: Urology

## 2018-06-28 DIAGNOSIS — I471 Supraventricular tachycardia: Secondary | ICD-10-CM | POA: Diagnosis not present

## 2018-06-28 DIAGNOSIS — J449 Chronic obstructive pulmonary disease, unspecified: Secondary | ICD-10-CM | POA: Diagnosis not present

## 2018-06-28 DIAGNOSIS — I10 Essential (primary) hypertension: Secondary | ICD-10-CM | POA: Diagnosis not present

## 2018-07-03 DIAGNOSIS — E119 Type 2 diabetes mellitus without complications: Secondary | ICD-10-CM | POA: Diagnosis not present

## 2018-07-03 DIAGNOSIS — I10 Essential (primary) hypertension: Secondary | ICD-10-CM | POA: Diagnosis not present

## 2018-07-03 DIAGNOSIS — J449 Chronic obstructive pulmonary disease, unspecified: Secondary | ICD-10-CM | POA: Diagnosis not present

## 2018-07-03 DIAGNOSIS — G894 Chronic pain syndrome: Secondary | ICD-10-CM | POA: Diagnosis not present

## 2018-07-13 ENCOUNTER — Telehealth (HOSPITAL_COMMUNITY): Payer: Self-pay | Admitting: Physical Therapy

## 2018-07-13 ENCOUNTER — Telehealth (HOSPITAL_COMMUNITY): Payer: Self-pay | Admitting: Specialist

## 2018-07-13 NOTE — Telephone Encounter (Signed)
Called patient to discuss options for eval, as clinic is on restricted hours/patients due to COVID19 pandemic.  Requested patient call our clinic at [phone]. Shirlean Mylar, MHA, OTR/L [phone]

## 2018-07-13 NOTE — Telephone Encounter (Signed)
Pt called regarding the clinic being closed until June 1st.  There was no answer.  Therapist left message for pt to please call the clinic.  If LE are red, painful or weeping we will need to see her.   Virgina Organ, PT CLT [phone]

## 2018-07-16 ENCOUNTER — Ambulatory Visit: Payer: Medicare Other | Admitting: Cardiology

## 2018-07-24 ENCOUNTER — Telehealth (HOSPITAL_COMMUNITY): Payer: Self-pay | Admitting: Physical Therapy

## 2018-07-24 NOTE — Telephone Encounter (Signed)
Ask CR to review and advise if patient needed to be r/s since she was scheduled before due to convid-19

## 2018-07-31 ENCOUNTER — Telehealth (HOSPITAL_COMMUNITY): Payer: Self-pay | Admitting: Internal Medicine

## 2018-07-31 NOTE — Telephone Encounter (Signed)
07/31/18  Left patient a message to cx this appt because therapist fell and broke her wrist and we would call her at a later time to reschedule

## 2018-08-02 ENCOUNTER — Encounter

## 2018-08-06 ENCOUNTER — Ambulatory Visit (HOSPITAL_COMMUNITY): Payer: Medicare Other | Admitting: Physical Therapy

## 2018-08-09 ENCOUNTER — Other Ambulatory Visit: Payer: Self-pay

## 2018-08-09 NOTE — Patient Outreach (Signed)
[facility]) Care Management  08/09/2018  Gina Bradley 1960/11/09 [id number]   Medication Adherence call to [name] HIPPA Compliant Voice message left with a call back number. [name] is showing past due on Metformin 500 mg under United [employer] Care Ins.   Lillia Abed CPhT Pharmacy Technician [facility] Care Management Direct Dial [phone]  Fax [phone] [email or web address]

## 2018-08-21 DIAGNOSIS — L409 Psoriasis, unspecified: Secondary | ICD-10-CM | POA: Diagnosis not present

## 2018-08-21 DIAGNOSIS — I10 Essential (primary) hypertension: Secondary | ICD-10-CM | POA: Diagnosis not present

## 2018-08-21 DIAGNOSIS — J449 Chronic obstructive pulmonary disease, unspecified: Secondary | ICD-10-CM | POA: Diagnosis not present

## 2018-08-21 DIAGNOSIS — E119 Type 2 diabetes mellitus without complications: Secondary | ICD-10-CM | POA: Diagnosis not present

## 2018-08-21 DIAGNOSIS — G894 Chronic pain syndrome: Secondary | ICD-10-CM | POA: Diagnosis not present

## 2018-08-21 DIAGNOSIS — R201 Hypoesthesia of skin: Secondary | ICD-10-CM | POA: Diagnosis not present

## 2018-08-21 DIAGNOSIS — E1140 Type 2 diabetes mellitus with diabetic neuropathy, unspecified: Secondary | ICD-10-CM | POA: Diagnosis not present

## 2018-08-24 ENCOUNTER — Other Ambulatory Visit: Payer: Self-pay | Admitting: *Deleted

## 2018-08-24 NOTE — Patient Outreach (Signed)
[facility]) Care Management  08/24/2018  Gina Bradley 12-13-1960 [id number]   Telephone Screen  Referral Date:08/24/18 Referral Source: MD referral  Referral Reason: Patient having car issues can't make appointments missed mammogram. Also needs direction on where to get incontinence supplies- Franki Cabot Insurance: united health care medicare     Outreach attempt # 1 No answer. [facility] RN CM left HIPAA compliant voicemail message along with CM's contact info.   Plan: [facility] RN CM sent an unsuccessful outreach letter and scheduled this patient for another call attempt within 4 business days   Caysie Minnifield L. Noelle Penner, RN, BSN, CCM [facility] Telephonic Care Management Care Coordinator Office number [phone] Mobile number [phone]  Main [facility] number [phone] Fax number [phone]

## 2018-08-28 ENCOUNTER — Other Ambulatory Visit: Payer: Self-pay | Admitting: *Deleted

## 2018-08-28 NOTE — Patient Outreach (Signed)
[facility]) Care Management  08/28/2018  Gina Bradley [birth year] [id number]   Telephone Screen  Referral Date:08/24/18 Referral Source: MD referral   Referral Reason: Patient having car issues can't make appointments missed mammogram. Also needs direction on where to get incontinence supplies- Franki Cabot Insurance: united health care medicare     Outreach attempt # 2 No answer. [facility] RN CM left HIPAA compliant voicemail message along with CM's contact info.   Plan: [facility] RN CM scheduled this patient for a final call attempt within 4 business days   Kimberlyann Hollar L. Noelle Penner, RN, BSN, CCM [facility] Telephonic Care Management Care Coordinator Office number [phone] Mobile number [phone]  Main [facility] number [phone] Fax number [phone]

## 2018-08-30 ENCOUNTER — Other Ambulatory Visit: Payer: Self-pay | Admitting: *Deleted

## 2018-08-30 ENCOUNTER — Other Ambulatory Visit: Payer: Self-pay

## 2018-08-30 NOTE — Patient Outreach (Signed)
[facility]) Care Management  08/30/2018  Gina Bradley [birth year] [id number]   Telephone Screen  Referral Date:08/24/18 Referral Source:MD referral Referral Reason:Patient having car issues can't make appointments missed mammogram. Also needs direction on where to get incontinence supplies- Gina Bradley Insurance:united health care medicare    Outreach attempt # 3 successful at her home/mobile number listed in Epic   Patient is able to verify HIPAA Reviewed and addressed MD referral to [facility] with patient  Gina Bradley confirms she has received [facility] CM' letter She confirms she is needing assistance with transportation to medical appointments and incontinence supplies. She reports her car does not have heat nor a defrost option and can not be used to transport her to medical appointments. She reports transport in her car is not tolerable with her COPD and causes increased signs and symptoms. She is still needing to get her car fixed. She confirms she needs to reschedule her missed mammogram  She states she called a number "from off the tv" about incontinence supplies and was informed she would be charged "twenty three dollars" She states she could use some pull ups. [facility] RN CM inquired if she was eligible for united healthcare OTC benefit quarterly program She is not sure. Cm offered her the [place] [facility] OTC contact number to call about incontinence supplies.   During the call Gina Bradley stated she did not know if her mobile number "would run out of minutes" therefore she attempted to find her land line number without success. She states she could only recall the last four digits of [phone]. She con   [facility] RN CM called her Cardiology office Gina Bradley) to get the date of her next appointment Gina Bradley reports Gina Bradley is not scheduled to be seen by Gina Bradley She was scheduled to be seen on  July 16 2018 but she was noted per staff to have called to  cancel but not reschedule. Gina Bradley reports that all visits now are tele- conference calls not visits   [facility] RN CM called Gina Bradley office spoke with Gina Bradley to find out pt is not scheduled to see a nephrologist or for a mammogram, only referral recently has been for an ENT referral  [facility] RN CM returned a call to Pt to find out who her nephrologist is and had to leave a voice message for her  [facility] RN CM recalled Gina Bradley stating during today's call to her that she may be at her mobile phone minute maximum, therefore, CM called her husband's listed number He has a voice mail box that is not set up, therefore, CM is unable to leave a message  [facility] RN CM made attempts to call [place]'s local nephrology office ([place]) but there was no answer [facility] RN CM received a return call from Gina Kristian Covey staff, Gina Bradley (after noticing a missed call) to updated CM that Gina Bradley was not a pt of Gina Kristian Covey  Social: Gina Bradley lives at home with her husband and son, Gina Bradley She reports she is a new grandmother of a 43 week old grandson. She reports being independent with her  Activities of daily living (ADLs)  but need some help now with  IADLs (instrumental Activities of Daily Living) as she has issues with her memory. 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 She reports prior to her father she had taking care of her mother, a brother and a sister. She reports now having questions about her father's  snf bills   Conditions: HTN, SVT, COPD, GERD, peripheral neuropathy, constipation, dyspepsia, postmenopausal bleeding,  Hypercholesteremia, cervical stenosis of spine, urinary incontinence, memory concerns, lymphedema, diabetes type 2, morbid obesity, anxiety, chronic pain syndrome  DME: cbg monitor   Medications: She denies concerns with  taking medications as prescribed, affording medications, side effects of medications and questions about medications  Appointments: She reports difficulty with keeping up with her medical appointment dates and times  Gina Bradley primary to be seen in "about a month" ?  08/21/18  Lymphedema clinic  Nephrologist to be seen  September 04 2018 Cardiologist for CHF f/u   Advance Directives: She has not completed advance directives at this time but has forms She prefers to call back for assistance [facility] for Advance directives prn at a later time   Consent: [facility] with patient. Patient gave verbal consent for services [facility] CM and [facility]  Plan: [facility] CM will refer Gina Gina Bradley for assistance with united healthcare medicare transportation or local community [facility]  transportation resources [facility] RN CM will follow up with Gina Gina Bradley within  7-10 business days to follow up on her (appointments, use of united healthcare OT benefits)  Gina Bradley L. Noelle PennerGibbs, RN, BSN, CCM [facility] Telephonic Care Management Care Coordinator Office number [phone] Mobile number [phone]  Main [facility] number [phone] Fax number [phone]

## 2018-08-31 ENCOUNTER — Other Ambulatory Visit: Payer: Self-pay

## 2018-08-31 ENCOUNTER — Telehealth (HOSPITAL_COMMUNITY): Payer: Self-pay | Admitting: Rehabilitation

## 2018-08-31 NOTE — Patient Outreach (Addendum)
[facility]) Care Management  08/31/2018  Gina Bradley 28-Jul-1960 [id number]   Unsuccessful outreach to patient regarding social work referral for resources for transportation and incontinence supplies. BSW left voicemail message.  Will attempt to reach again within four business days.  Addendum: BSW received return call from patient.   Patient reports that she received incontinence supplies yesterday that were previously ordered.  BSW and patient discussed use of UHC OTC Catalog and patient verbalized understanding of how to use this.  BSW informed patient that she should have transportation benefits with [facility] and provided her with contact information for [employer]. BSW also informed her that she may be eligible for transportation benefits through Medicaid but likely has to complete an assessment first.  Conducted three way call to [facility] DSS and left a voicemail message for her caseworker. In addition to transportation benefits, BSW encouraged patient to ask her caseworker about whether or not she can obtain incontinence supplies that will be covered by Medicaid.   BSW is closing case but did encourage patient to call if additional needs or questions arise.    Malachy Chamber, BSW Social Worker [phone]

## 2018-08-31 NOTE — Telephone Encounter (Signed)
Beth said to call Gina Bradley and schedule for 09-11-18. Gina Bradley agreed to come on this date

## 2018-08-31 NOTE — Telephone Encounter (Signed)
Could not complete reg-patient due to another MD office had the record opened . NF 08/31/2018@4 :18

## 2018-09-04 ENCOUNTER — Ambulatory Visit (INDEPENDENT_AMBULATORY_CARE_PROVIDER_SITE_OTHER): Payer: Medicare Other | Admitting: Urology

## 2018-09-04 ENCOUNTER — Other Ambulatory Visit: Payer: Self-pay | Admitting: *Deleted

## 2018-09-04 ENCOUNTER — Ambulatory Visit: Payer: Medicare Other

## 2018-09-04 ENCOUNTER — Other Ambulatory Visit: Payer: Self-pay

## 2018-09-04 DIAGNOSIS — M549 Dorsalgia, unspecified: Secondary | ICD-10-CM | POA: Diagnosis not present

## 2018-09-04 DIAGNOSIS — R3129 Other microscopic hematuria: Secondary | ICD-10-CM | POA: Diagnosis not present

## 2018-09-04 NOTE — Patient Outreach (Addendum)
[facility]) Care Management  09/04/2018  Gina Bradley [birth year] [id number]   Care coordination- f/u on transportation and incontinence supplies   Outreach attempt was initially unsuccessful  As [facility] RN CM was leaving a voice message for Gina Bradley, Gina Bradley called RN CM.  Patient is able to verify HIPAA Reviewed and addressed the reason for the return call (f/u on transportation and incontinence supplies) with patient  Consent: [facility] RN CM reviewed [facility] services with patient. Patient gave verbal consent for services [facility] telephonic RN CM.   Gina Bradley reports she had not resolved any of the goals to get connected with [employer] or united healthcare OTC benefit program She confirms a call from [facility] SW but was unsure of the date, day of the call and had difficulty recalling the SW call content.   [facility] RN CM reviewed [facility] SW notes with her to trigger her memory.  Gina Bradley reports to [facility] RN CM that "my memory is short", "I got a lot on my plate" She had to be reminded that she informed [facility] SW she received incontinence pads from united healthcare. She confirms she has on incontinence pad today from the ones sent to her.  She initially did not recall that [facility] SW gave her the contact information for [employer]. She did not recall initially that she and [facility] SW completed a conference call and left a message for DSS staff. Today she can not recall the name of her [facility] DSS case worker. She reports having "about fifteen voice messages on her phone", therefore she is unsure if the case worker has called her back.     She report poor support from her husband, son and daughter. She states her husband has medical issue of his own , like gout and her mother in law previously assisted him but is now deceased. She reports her son is on disability and she attempt to assist him. She reports her daughter "just can't do it" She reports her brother just got release (was incarcerated) She  reports difficulty remembering things even when she writes them down. She reports not recalling being tested or seeing provider for memory concerns. She states she saw Dr Sherwood Gambler "about two weeks ago"   Today she had and appointment with Dr Ronne Binning, urologist. She states she drove  er truck to this appointment vs using any transportation resources. She reports she has also been seen by Dr Suszanne Conners, urologist. She reports she was informed that she had some "blood in my kidneys" She reports this is related to her hx of kidney stones and MI.  [facility] RN CM discussed with her diet changes to prevent kidney stones but she is aware of the need to decrease sodium, "pepsi", coffee and "dark drinks"   She clarifies with [facility] RN CM that she has medicaid (disabilty) after CM reviewed [facility] SW note. She states previously she had medicaid and food stamps but she no longer has food stamps   Plan [facility] RN CM will follow up with Gina Bradley within 4-7 days when she is at home. She was encouraged to contact [facility] RN CM within the 4-7 days when she was home to review [employer] and [place] health care OTC benefit again prior to case closure   Azyiah Bo L. Noelle Penner, RN, BSN, CCM [facility] Telephonic Care Management Care Coordinator Office number [phone] Mobile number [phone]  Main [facility] number [phone] Fax number [phone]

## 2018-09-10 ENCOUNTER — Other Ambulatory Visit: Payer: Self-pay | Admitting: *Deleted

## 2018-09-10 ENCOUNTER — Other Ambulatory Visit: Payer: Self-pay

## 2018-09-10 NOTE — Patient Outreach (Addendum)
[place] [facility]) Care Management  09/10/2018  SHAVAWN STOBAUGH 1960-07-23 [id number]   Care coordination   Patient is able to verify HIPAA Reviewed the purpose of the call - to review transportation and incontinence supply resources plus case closure She is speaking with more clarity today and recalls on today speaking with [facility] SW and [facility] RN CM last week   Consent: [facility] CM reviewed [facility] services with patient. Patient gave verbal consent for services.  [facility] RN CM provided the number for [employer] [phone] and [facility] [phone]. She confirms she receives [place] health care catalog for supplies She was encouraged to use the first medical number an the catalog to obtain her incontinence supplies She informed CM she has a lymphedema clinic appointment only on 09/10/18 but she did not think she was going because it "is too early"  Cm unable to assist with transportation services as she was choosing not to go to scheduled appointment and it is not meeting [employer] 72 hour scheduling requirement.  She inquired about how to get a shower chair Cm discussed her calling Dr Gerarda Fraction to request an order to be sent to her choice of DME Company. She then informed CM, she had spoken to Dr Gerarda Fraction and he has completed this process in 2019 but united healthcare would not "pay for it" CM discussed with her medicare guidelines, united health care has requirements that need to be met and out of pocket if duplicate DME received within a required time frame She voiced understanding She reports she will speak with Dr Gerarda Fraction  Plan case closure   [place] L. Lavina Hamman, RN, BSN, [place] Coordinator Office number [phone] Mobile number [phone]  Main [facility] number [phone] Fax number [phone]

## 2018-09-11 ENCOUNTER — Telehealth (HOSPITAL_COMMUNITY): Payer: Self-pay | Admitting: Rehabilitation

## 2018-09-11 ENCOUNTER — Ambulatory Visit (HOSPITAL_COMMUNITY): Payer: Medicare Other | Admitting: Rehabilitation

## 2018-09-11 NOTE — Telephone Encounter (Signed)
Pt called and said she over slept and will have to r/s

## 2018-09-12 DIAGNOSIS — J449 Chronic obstructive pulmonary disease, unspecified: Secondary | ICD-10-CM | POA: Diagnosis not present

## 2018-09-12 DIAGNOSIS — G894 Chronic pain syndrome: Secondary | ICD-10-CM | POA: Diagnosis not present

## 2018-09-12 DIAGNOSIS — L0390 Cellulitis, unspecified: Secondary | ICD-10-CM | POA: Diagnosis not present

## 2018-09-20 ENCOUNTER — Ambulatory Visit (INDEPENDENT_AMBULATORY_CARE_PROVIDER_SITE_OTHER): Payer: Medicare Other | Admitting: Otolaryngology

## 2018-09-24 ENCOUNTER — Telehealth (HOSPITAL_COMMUNITY): Payer: Self-pay | Admitting: Rehabilitation

## 2018-09-24 NOTE — Telephone Encounter (Signed)
Pt called back and wanted to schedule Lymph eval. I will ask Beth tomorrow when this can be scheduled and call patient back with that information

## 2018-09-24 NOTE — Telephone Encounter (Signed)
Called pt offered 8 AM on Tuesday, 6/30-L/m that if patient did not take this appointment time they would have to wait until Jenny Reichmann returns in August-- per [facility].

## 2018-09-25 ENCOUNTER — Telehealth (HOSPITAL_COMMUNITY): Payer: Self-pay | Admitting: Rehabilitation

## 2018-09-25 NOTE — Telephone Encounter (Signed)
Patient called back and s/w Crystal -she doesn't want the 8:30am 10/02/18 appointement and will wait for Jenny Reichmann to return to make an appointment.

## 2018-10-15 ENCOUNTER — Other Ambulatory Visit: Payer: Medicare Other | Admitting: Obstetrics and Gynecology

## 2018-10-24 DIAGNOSIS — J449 Chronic obstructive pulmonary disease, unspecified: Secondary | ICD-10-CM | POA: Diagnosis not present

## 2018-10-24 DIAGNOSIS — G894 Chronic pain syndrome: Secondary | ICD-10-CM | POA: Diagnosis not present

## 2018-11-19 ENCOUNTER — Other Ambulatory Visit: Payer: Self-pay | Admitting: Internal Medicine

## 2018-11-20 ENCOUNTER — Ambulatory Visit (HOSPITAL_COMMUNITY): Payer: Medicare Other | Attending: Internal Medicine | Admitting: Physical Therapy

## 2018-11-20 ENCOUNTER — Encounter (HOSPITAL_COMMUNITY): Payer: Self-pay | Admitting: Physical Therapy

## 2018-11-20 ENCOUNTER — Other Ambulatory Visit: Payer: Self-pay

## 2018-11-20 DIAGNOSIS — I890 Lymphedema, not elsewhere classified: Secondary | ICD-10-CM

## 2018-11-20 NOTE — Therapy (Signed)
[facility] [address] [place], [place], [address] Phone: [phone]   Fax:  [phone]  Physical Therapy Evaluation  Patient Details  Name: Gina Bradley Mankey MRN: [id number] Date of Birth: 12-23-1960 Referring Provider (PT): Lewayne BuntingGregg Taylor   Encounter Date: 11/20/2018  PT End of Session - 11/20/18 1601    Visit Number  1    Number of Visits  12    Date for PT Re-Evaluation  12/20/18    Authorization Type  UHC MEdicare    Authorization - Visit Number  1    Authorization - Number of Visits  10    PT Start Time  1509    PT Stop Time  1602    PT Time Calculation (min)  53 min    Activity Tolerance  Patient tolerated treatment well    Behavior During Therapy  [facility] for tasks assessed/performed       Past Medical History:  Diagnosis Date  . Anemia   . Cervical disc disease   . COPD (chronic obstructive pulmonary disease) (HCC)   . Diabetes mellitus without complication (HCC)   . Essential hypertension   . GERD (gastroesophageal reflux disease)   . Heart failure (HCC) 2018  . History of cardiac catheterization    Normal coronaries March 2015  . History of non-ST elevation myocardial infarction (NSTEMI)    Secondary to SVT  . Lymphedema   . Peripheral neuropathy   . Spinal stenosis   . SVT (supraventricular tachycardia) (HCC)     Past Surgical History:  Procedure Laterality Date  . CESAREAN SECTION    . ESOPHAGOGASTRODUODENOSCOPY N/A 02/10/2014   ONG:EXBMWURMR:Single tiny antral erosion; otherwise normal EGD. No explanation for patient's symptoms. Gallbladder needs further evaluation.  [name]. LEFT HEART CATHETERIZATION WITH CORONARY ANGIOGRAM N/A 06/03/2013   Procedure: LEFT HEART CATHETERIZATION WITH CORONARY ANGIOGRAM;  Surgeon: Lesleigh NoeHenry W Smith III, MD;  [facility];  Service: Cardiovascular;  Laterality: N/A;    There were no vitals filed for this visit.   Subjective Assessment - 11/20/18 1456    Subjective  Ms. Zachery DauerBarnes states  that she is having some increased volume in her arms as well as in her neck and face.  She mentioned this to her MD who would like her to obtain information on self massaging and exercises to assist in decreasing her edema.  She would  like to know where she can get compression for her arms.    Pertinent History  Heart failure, COPD, DM, HTN.    How long can you sit comfortably?  no problem    How long can you stand comfortably?  20-30 minutes    Patient Stated Goals  To get some fluid out.    Currently in Pain?  No/denies         [facility] - 11/20/18 0001      Assessment   Medical Diagnosis  B UE/ head and neck lymphedema    Referring Provider (PT)  Lewayne BuntingGregg Taylor    Onset Date/Surgical Date  07/04/18    Next MD Visit  --   next year    Prior Therapy  for B LE lymphedema.       Precautions   Precautions  --   cellulitis     Restrictions   Weight Bearing Restrictions  No      Balance Screen   Has the patient fallen in the past 6 months  No    Has the patient  had a decrease in activity level because of a fear of falling?   No    Is the patient reluctant to leave their home because of a fear of falling?   No      Prior Function   Level of Independence  Independent      Cognition   Overall Cognitive Status  Within Functional Limits for tasks assessed      Observation/Other Assessments   Focus on Therapeutic Outcomes (FOTO)   --   Life impact:  43       LYMPHEDEMA/ONCOLOGY QUESTIONNAIRE - 11/20/18 1513      What other symptoms do you have   Are you Having Heaviness or Tightness  Yes    Are you having Pain  Yes    Are you having pitting edema  Yes    Body Site  LE    Is it Hard or Difficult finding clothes that fit  Yes    Do you have infections  No      Lymphedema Stage   Stage  STAGE 2 SPONTANEOUSLY IRREVERSIBLE      Lymphedema Assessments   Lymphedema Assessments  Upper extremities;Head and Neck      Right Upper Extremity Lymphedema   15 cm Proximal  to Olecranon Process  40.8 cm    10 cm Proximal to Olecranon Process  36.8 cm    Olecranon Process  29.5 cm    15 cm Proximal to Ulnar Styloid Process  29.1 cm    10 cm Proximal to Ulnar Styloid Process  25.9 cm    Just Proximal to Ulnar Styloid Process  18 cm    Across Hand at [employer]  19.3 cm    At [employer] of 2nd Digit  6 cm      Left Upper Extremity Lymphedema   15 cm Proximal to Olecranon Process  41 cm    10 cm Proximal to Olecranon Process  38.4 cm    Olecranon Process  30.2 cm    15 cm Proximal to Ulnar Styloid Process  28.9 cm    10 cm Proximal to Ulnar Styloid Process  27 cm    Just Proximal to Ulnar Styloid Process  17.8 cm    Across Hand at [employer]  19 cm    At [place] of 2nd Digit  6.1 cm      Head and Neck   Right Lateral Nostril at base of nose to medial tragus   11.1 cm    Left Lateral Nostril at base of nose to medial tragus   9.5 cm    Right Corner of mouth to where ear lobe meets face  10.2 cm    Left Corner of mouth to where ear lobe meets face  10.2 cm    4 cm superior to sternal notch around neck  43.8 cm    6 cm superior to sternal notch around neck  44.8 cm    8 cm superior to sternal notch around neck  46.3 cm             Objective measurements completed on examination: See above findings.     Foam was cut and given to pt for cervical edema.  Therapist explained not to allow foam to touch her skin as It would irritate as well as to attempt to wear compression for four hours per day.          PT Education - 11/20/18 1559    Education  Details  PT educated on the use of cervical compression made by therapist, UE exercises to increase lymphatic circulation.    Person(s) Educated  Patient    Methods  Explanation;Demonstration;Verbal cues;Handout    Comprehension  Verbalized understanding;Returned demonstration       PT Short Term Goals - 11/20/18 1610      PT SHORT TERM GOAL #1   Title  PT to have decreased volumes by 2 cm in  UE; 1 cm in cervical    Time  2    Period  Weeks    Status  New    Target Date  12/04/18        PT Long Term Goals - 11/20/18 1610      PT LONG TERM GOAL #1   Title  PT to be I in self manual techniques for chest, neck , face and UE.    Time  4    Period  Weeks    Status  New    Target Date  12/18/18      PT LONG TERM GOAL #2   Title  Pt to have obtained and to be able to don UE compression sleeves    Time  4    Period  Weeks    Status  New             Plan - 11/20/18 1452    Clinical Impression Statement  Ms. Walls is a known pt to this clinic who has been seen in the past for Bilateral LE lymphedema.  She is now being referred for B UE lymphedema.  She is also concerned about the swelling in her neck and face.   Ms. Augustine was educated that pushing the fluid from her arms could increase the edema in her LE>  It was agreed that we will try exercise and manual for the lymphedema in her chest, UE, neck and face.  We will use compression on the cervical but not on the UE.  She states that she has purchased a face mask for facial compression which I believe is a good idea. Ms. Alvidrez will benefit from skilled PT to educate pt on self manual techniques to increase lymph flow as well as manual decongestive techniques for her chest, cervical, head and B UE>    Personal Factors and Comorbidities  Comorbidity 3+;Fitness;Social Background;Transportation;Time since onset of injury/illness/exacerbation    Comorbidities  HEar failure, COPD, DM, HTN, B LE Lymphedema.    Examination-Participation Restrictions  Cleaning    Stability/Clinical Decision Making  Evolving/Moderate complexity    Clinical Decision Making  Moderate    Rehab Potential  Fair    PT Frequency  3x / week    PT Duration  4 weeks    PT Treatment/Interventions  ADLs/Self Care Home Management;Manual lymph drainage;Compression bandaging;Patient/family education;Therapeutic exercise    PT Next Visit Plan  check cervical  ROM, begin manual for head and neck as well as B UE.  Give pt self manual technique for head and neck.    PT Home Exercise Plan  UE ROM to improve lymphatic flow, using cervical compression made by the therapist.    Consulted and Agree with Plan of Care  Patient       Patient will benefit from skilled therapeutic intervention in order to improve the following deficits and impairments:  Increased edema, Decreased skin integrity, Pain  Visit Diagnosis: 1. Lymphedema, not elsewhere classified        Problem List Patient Active Problem List  Diagnosis Date Noted  . PMB (postmenopausal bleeding) 10/06/2016  . Chest pressure 01/05/2015  . Gastric erosion   . Encounter for screening colonoscopy 01/22/2014  . Constipation 01/20/2014  . GERD (gastroesophageal reflux disease) 01/20/2014  . Dyspepsia 01/20/2014  . SVT (supraventricular tachycardia) (HCC) 05/31/2013  . HTN (hypertension), benign 05/31/2013  . Hypercholesteremia 05/31/2013  . COPD (chronic obstructive pulmonary disease) (HCC) 05/31/2013  . Cervical stenosis of spine 05/31/2013  . Peripheral neuropathy 05/31/2013    Virgina Organynthia Aleksandra Raben, PT CLT [phone] 11/20/2018, 4:16 PM  [facility] [address] [place], [place], [address] Phone: [phone]   Fax:  [phone]  Name: Gina Bradley MRN: [id number] Date of Birth: 1960-05-18

## 2018-11-22 ENCOUNTER — Telehealth (HOSPITAL_COMMUNITY): Payer: Self-pay | Admitting: Physical Therapy

## 2018-11-22 ENCOUNTER — Encounter (HOSPITAL_COMMUNITY): Payer: Self-pay | Admitting: Physical Therapy

## 2018-11-22 ENCOUNTER — Ambulatory Visit (HOSPITAL_COMMUNITY): Payer: Medicare Other | Admitting: Physical Therapy

## 2018-11-22 ENCOUNTER — Other Ambulatory Visit: Payer: Self-pay

## 2018-11-22 DIAGNOSIS — I890 Lymphedema, not elsewhere classified: Secondary | ICD-10-CM

## 2018-11-22 NOTE — Telephone Encounter (Signed)
CR states Pt only needs 45 mins for all future appointments in Sept.  NF 11/22/2018

## 2018-11-22 NOTE — Therapy (Signed)
[place], [place], [address] Phone: [phone]   Fax:  [phone]  Physical Therapy Treatment  Patient Details  Name: Gina Bradley MRN: [id number] Date of Birth: 09-12-1960 Referring Provider (PT): Cristopher Peru   Encounter Date: 11/22/2018  PT End of Session - 11/22/18 1546    Visit Number  2    Number of Visits  12    Date for PT Re-Evaluation  12/20/18    Authorization Type  [employer]HC MEdicare    Authorization - Visit Number  2    Authorization - Number of Visits  10    PT Start Time  1440   PT late for session   PT Stop Time  1530    PT Time Calculation (min)  50 min    Activity Tolerance  Patient tolerated treatment well    Behavior During Therapy  [facility] for tasks assessed/performed       Past Medical History:  Diagnosis Date  . Anemia   . Cervical disc disease   . COPD (chronic obstructive pulmonary disease) ([place])   . Diabetes mellitus without complication ([place])   . Essential hypertension   . GERD (gastroesophageal reflux disease)   . Heart failure ([place]) 2018  . History of cardiac catheterization    Normal coronaries March 2015  . History of non-ST elevation myocardial infarction (NSTEMI)    Secondary to SVT  . Lymphedema   . Peripheral neuropathy   . Spinal stenosis   . SVT (supraventricular tachycardia) (HCC)     Past Surgical History:  Procedure Laterality Date  . CESAREAN SECTION    . ESOPHAGOGASTRODUODENOSCOPY N/A 02/10/2014   [facility]:WCBJSE tiny antral erosion; otherwise normal EGD. No explanation for patient's symptoms. Gallbladder needs further evaluation.  [name] LEFT HEART CATHETERIZATION WITH CORONARY ANGIOGRAM N/A 06/03/2013   Procedure: LEFT HEART CATHETERIZATION WITH CORONARY ANGIOGRAM;  Surgeon: Sinclair Grooms, MD;  Location[facility] C[facility] LAB;  Service: Cardiovascular;  Laterality: N/A;    There were no vitals filed for this visit.  Subjective Assessment - 11/22/18 1543    Subjective  Pt  states that she has been trying to use the compression that the therapist gave her but probably not as much as she should.    Pertinent History  Heart failure, COPD, DM, HTN.    How long can you sit comfortably?  no problem    How long can you stand comfortably?  20-30 minutes    Patient Stated Goals  To get some fluid out.           [place] Adult PT Treatment/Exercise - 11/22/18 0001      Manual Therapy   Manual Therapy  Manual Lymphatic Drainage (MLD)    Manual Lymphatic Drainage (MLD)  To include supraclavicular, deep and superfical abdominal , anterior UE B, chest head and neck.               PT Education - 11/22/18 1546    Education Details  1-8  for chest decongestion.    Person(s) Educated  Patient    Methods  Explanation;Demonstration;Tactile cues;Verbal cues;Handout    Comprehension  Verbalized understanding;Returned demonstration;Need further instruction       PT Short Term Goals - 11/22/18 1551      PT SHORT TERM GOAL #1   Title  PT to have decreased volumes by 2 cm in UE; 1 cm in cervical    Time  2    Period  Weeks  Status  On-going    Target Date  12/04/18        PT Long Term Goals - 11/22/18 1552      PT LONG TERM GOAL #1   Title  PT to be I in self manual techniques for chest, neck , face and UE.    Time  4    Period  Weeks    Status  On-going      PT LONG TERM GOAL #2   Title  Pt to have obtained and to be able to don UE compression sleeves    Time  4    Period  Weeks    Status  On-going            Plan - 11/22/18 1548    Clinical Impression Statement  Evaluation and Goals reviewed with pt.  Therapist began manual techniques with instruction to pt for chest, cervical and facial lymphedema as well as B UE.  PT appears to have decongested in her cervical area. Therapist urged pt to wear her compression for four hours every day even if she needs to break this down into two two hour increments.    Personal Factors and Comorbidities   Comorbidity 3+;Fitness;Social Background;Transportation;Time since onset of injury/illness/exacerbation    Comorbidities  HEar failure, COPD, DM, HTN, B LE Lymphedema.    Examination-Participation Restrictions  Cleaning    Stability/Clinical Decision Making  Evolving/Moderate complexity    Rehab Potential  Fair    PT Frequency  3x / week    PT Duration  4 weeks    PT Treatment/Interventions  ADLs/Self Care Home Management;Manual lymph drainage;Compression bandaging;Patient/family education;Therapeutic exercise    PT Next Visit Plan  check cervical ROM, .    PT Home Exercise Plan  UE ROM to improve lymphatic flow, using cervical compression made by the therapist.    Consulted and Agree with Plan of Care  Patient       Patient will benefit from skilled therapeutic intervention in order to improve the following deficits and impairments:  Increased edema, Decreased skin integrity, Pain  Visit Diagnosis: Lymphedema, not elsewhere classified     Problem List Patient Active Problem List   Diagnosis Date Noted  . PMB (postmenopausal bleeding) 10/06/2016  . Chest pressure 01/05/2015  . Gastric erosion   . Encounter for screening colonoscopy 01/22/2014  . Constipation 01/20/2014  . GERD (gastroesophageal reflux disease) 01/20/2014  . Dyspepsia 01/20/2014  . SVT (supraventricular tachycardia) (HCC) 05/31/2013  . HTN (hypertension), benign 05/31/2013  . Hypercholesteremia 05/31/2013  . COPD (chronic obstructive pulmonary disease) (HCC) 05/31/2013  . Cervical stenosis of spine 05/31/2013  . Peripheral neuropathy 05/31/2013    Virgina Organynthia Russell, PT CLT [phone] 11/22/2018, 3:52 PM  [place] [facility] [address] [place], [place], [address] Phone: [phone]   Fax:  [phone]  Name: Beaulah Dinningngela J Malkiewicz MRN: [id number] Date of Birth: 1960/11/05

## 2018-11-26 ENCOUNTER — Ambulatory Visit (HOSPITAL_COMMUNITY): Payer: Medicare Other | Admitting: Physical Therapy

## 2018-11-26 ENCOUNTER — Encounter (HOSPITAL_COMMUNITY): Payer: Self-pay | Admitting: Physical Therapy

## 2018-11-26 ENCOUNTER — Other Ambulatory Visit: Payer: Self-pay

## 2018-11-26 DIAGNOSIS — I890 Lymphedema, not elsewhere classified: Secondary | ICD-10-CM | POA: Diagnosis not present

## 2018-11-26 NOTE — Therapy (Signed)
[place], [place], [address] Phone: [phone]   Fax:  [phone]  Physical Therapy Treatment  Patient Details  Name: Gina Bradley MRN: [id number] Date of Birth: [birth year] Referring Provider (PT): Cristopher Peru   Encounter Date: 11/26/2018  PT End of Session - 11/26/18 1518    Visit Number  3    Number of Visits  12    Date for PT Re-Evaluation  12/20/18    Authorization Type  UHC MEdicare    Authorization - Visit Number  3    Authorization - Number of Visits  10    PT Start Time  1749    PT Stop Time  1515    PT Time Calculation (min)  52 min    Activity Tolerance  Patient tolerated treatment well    Behavior During Therapy  [facility] for tasks assessed/performed       Past Medical History:  Diagnosis Date  . Anemia   . Cervical disc disease   . COPD (chronic obstructive pulmonary disease) ([place])   . Diabetes mellitus without complication ([place])   . Essential hypertension   . GERD (gastroesophageal reflux disease)   . Heart failure ([place]) 2018  . History of cardiac catheterization    Normal coronaries March 2015  . History of non-ST elevation myocardial infarction (NSTEMI)    Secondary to SVT  . Lymphedema   . Peripheral neuropathy   . Spinal stenosis   . SVT (supraventricular tachycardia) (HCC)     Past Surgical History:  Procedure Laterality Date  . CESAREAN SECTION    . ESOPHAGOGASTRODUODENOSCOPY N/A 02/10/2014   [facility]:QPRFFM tiny antral erosion; otherwise normal EGD. No explanation for patient's symptoms. Gallbladder needs further evaluation.  [name] LEFT HEART CATHETERIZATION WITH CORONARY ANGIOGRAM N/A 06/03/2013   Procedure: LEFT HEART CATHETERIZATION WITH CORONARY ANGIOGRAM;  Surgeon: Sinclair Grooms, MD;  Location: [facility] CATH LAB;  Service: Cardiovascular;  Laterality: N/A;    There were no vitals filed for this visit.  Subjective Assessment - 11/26/18 1515    Subjective  Pt has been using her  compression every four hours; states that she can tell the difference.  PT  has been completing self massage with no questions for head and neck lymphedema.    Pertinent History  Heart failure, COPD, DM, HTN.    How long can you sit comfortably?  no problem    How long can you stand comfortably?  20-30 minutes    Patient Stated Goals  To get some fluid out.              [place] Adult PT Treatment/Exercise - 11/26/18 0001      Manual Therapy   Manual Therapy  Manual Lymphatic Drainage (MLD)    Manual Lymphatic Drainage (MLD)  To include supraclavicular, deep and superfical abdominal , anteriior UE B, chest head and neck.               PT Education - 11/26/18 1517    Education Details  self manual for UE modified due to pt having Bilateral LE lymphedema as well.    Person(s) Educated  Patient    Methods  Explanation;Demonstration;Tactile cues;Verbal cues;Handout    Comprehension  Verbalized understanding;Returned demonstration       PT Short Term Goals - 11/22/18 1551      PT SHORT TERM GOAL #1   Title  PT to have decreased volumes by 2 cm in UE; 1 cm in  cervical    Time  2    Period  Weeks    Status  On-going    Target Date  12/04/18        PT Long Term Goals - 11/22/18 1552      PT LONG TERM GOAL #1   Title  PT to be I in self manual techniques for chest, neck , face and UE.    Time  4    Period  Weeks    Status  On-going      PT LONG TERM GOAL #2   Title  Pt to have obtained and to be able to don UE compression sleeves    Time  4    Period  Weeks    Status  On-going            Plan - 11/26/18 1518    Clinical Impression Statement  Cervical ROM checked and is wnl.  PT instructed in self manual techniques for UE.  Therapist reminded pt that she needs to obtain new LE garments every six months.    Personal Factors and Comorbidities  Comorbidity 3+;Fitness;Social Background;Transportation;Time since onset of injury/illness/exacerbation     Comorbidities  HEar failure, COPD, DM, HTN, B LE Lymphedema.    Examination-Participation Restrictions  Cleaning    Stability/Clinical Decision Making  Evolving/Moderate complexity    Rehab Potential  Fair    PT Frequency  3x / week    PT Duration  4 weeks    PT Treatment/Interventions  ADLs/Self Care Home Management;Manual lymph drainage;Compression bandaging;Patient/family education;Therapeutic exercise    PT Next Visit Plan  Measure both UE and LE for compression garment; give pt order for compression garment.    PT Home Exercise Plan  UE ROM to improve lymphatic flow, using cervical compression made by the therapist.    Consulted and Agree with Plan of Care  Patient       Patient will benefit from skilled therapeutic intervention in order to improve the following deficits and impairments:  Increased edema, Decreased skin integrity, Pain  Visit Diagnosis: Lymphedema, not elsewhere classified     Problem List Patient Active Problem List   Diagnosis Date Noted  . PMB (postmenopausal bleeding) 10/06/2016  . Chest pressure 01/05/2015  . Gastric erosion   . Encounter for screening colonoscopy 01/22/2014  . Constipation 01/20/2014  . GERD (gastroesophageal reflux disease) 01/20/2014  . Dyspepsia 01/20/2014  . SVT (supraventricular tachycardia) (HCC) 05/31/2013  . HTN (hypertension), benign 05/31/2013  . Hypercholesteremia 05/31/2013  . COPD (chronic obstructive pulmonary disease) (HCC) 05/31/2013  . Cervical stenosis of spine 05/31/2013  . Peripheral neuropathy 05/31/2013   Virgina Organynthia Kylani Wires, PT CLT [phone] 11/26/2018, 3:22 PM  [facility] [address] [place], [place], [address] Phone: [phone]   Fax:  [phone]  Name: Beaulah Dinningngela J Montagna MRN: [id number] Date of Birth: Mar 02, 1961

## 2018-11-28 ENCOUNTER — Encounter (HOSPITAL_COMMUNITY): Payer: Medicare Other | Admitting: Physical Therapy

## 2018-11-28 ENCOUNTER — Telehealth (HOSPITAL_COMMUNITY): Payer: Self-pay | Admitting: Physical Therapy

## 2018-11-28 NOTE — Telephone Encounter (Signed)
Pt called to confirm apptment time. NF 11/28/2018

## 2018-11-29 ENCOUNTER — Ambulatory Visit (HOSPITAL_COMMUNITY): Payer: Medicare Other | Admitting: Physical Therapy

## 2018-11-29 ENCOUNTER — Telehealth (HOSPITAL_COMMUNITY): Payer: Self-pay | Admitting: Physical Therapy

## 2018-11-29 NOTE — Telephone Encounter (Signed)
pt called in to say that she forgot what time her appt was. she thought it was at 2:15pm.

## 2018-11-30 ENCOUNTER — Other Ambulatory Visit: Payer: Self-pay

## 2018-11-30 ENCOUNTER — Encounter (HOSPITAL_COMMUNITY): Payer: Self-pay | Admitting: Physical Therapy

## 2018-11-30 ENCOUNTER — Ambulatory Visit (HOSPITAL_COMMUNITY): Payer: Medicare Other | Admitting: Physical Therapy

## 2018-11-30 DIAGNOSIS — I890 Lymphedema, not elsewhere classified: Secondary | ICD-10-CM

## 2018-11-30 NOTE — Therapy (Signed)
[place], [place], [address] Phone: [phone]   Fax:  [phone]  Physical Therapy Treatment  Patient Details  Name: Gina Bradley MRN: [id number] Date of Birth: 1961-03-19 Referring Provider (PT): Cristopher Peru   Encounter Date: 11/30/2018  PT End of Session - 11/30/18 1608    Visit Number  4    Number of Visits  12    Date for PT Re-Evaluation  12/20/18    Authorization Type  [employer]    Authorization - Visit Number  4    Authorization - Number of Visits  10    PT Start Time  7322    PT Stop Time  1510    PT Time Calculation (min)  45 min    Activity Tolerance  Patient tolerated treatment well    Behavior During Therapy  [facility] for tasks assessed/performed       Past Medical History:  Diagnosis Date  . Anemia   . Cervical disc disease   . COPD (chronic obstructive pulmonary disease) ([place])   . Diabetes mellitus without complication ([place])   . Essential hypertension   . GERD (gastroesophageal reflux disease)   . Heart failure ([place]) 2018  . History of cardiac catheterization    Normal coronaries March 2015  . History of non-ST elevation myocardial infarction (NSTEMI)    Secondary to SVT  . Lymphedema   . Peripheral neuropathy   . Spinal stenosis   . SVT (supraventricular tachycardia) (H[facility])     Past Surgical History:  Procedure Laterality Date  . CESAREAN SECTION    . ESOPHAGOGASTRODUODENOSCOPY N/A 02/10/2014   GUR:KYHCWC tiny antral erosion; otherwise normal EGD. No explanation for patient's symptoms. Gallbladder needs further evaluation.  [name] LEFT HEART CATHETERIZATION WITH CORONARY ANGIOGRAM N/A 06/03/2013   Procedure: LEFT HEART CATHETERIZATION WITH CORONARY ANGIOGRAM;  Surgeon: Sinclair Grooms, MD;  Location: [facility] CATH LAB;  Service: Cardiovascular;  Laterality: N/A;    There were no vitals filed for this visit.         LYMPHEDEMA/ONCOLOGY QUESTIONNAIRE - 11/30/18 1438      Right  Upper Extremity Lymphedema   15 cm Proximal to Olecranon Process  40.5 cm    10 cm Proximal to Olecranon Process  36 cm    Olecranon Process  28.7 cm    15 cm Proximal to Ulnar Styloid Process  28.3 cm    10 cm Proximal to Ulnar Styloid Process  24 cm    Just Proximal to Ulnar Styloid Process  16.5 cm    Across Hand at [employer]  17.5 cm    At [place] of 2nd Digit  6.2 cm      Left Upper Extremity Lymphedema   15 cm Proximal to Olecranon Process  40 cm    10 cm Proximal to Olecranon Process  37.5 cm    Olecranon Process  28 cm    15 cm Proximal to Ulnar Styloid Process  27.9 cm    10 cm Proximal to Ulnar Styloid Process  24.5 cm    Just Proximal to Ulnar Styloid Process  17 cm    Across Hand at [employer]  18 cm    At [place] of 2nd Digit  5.8 cm      Head and Neck   Right Lateral Nostril at base of nose to medial tragus   10.3 cm    Left Lateral Nostril at base of nose to medial  tragus   9.5 cm    Right Corner of mouth to where ear lobe meets face  9.2 cm    Left Corner of mouth to where ear lobe meets face  9.4 cm    4 cm superior to sternal notch around neck  43 cm    6 cm superior to sternal notch around neck  44 cm                [facility] Adult PT Treatment/Exercise - 11/30/18 0001      Manual Therapy   Manual Therapy  Manual Lymphatic Drainage (MLD)    Manual Lymphatic Drainage (MLD)  To include supraclavicular, deep and superfical abdominal , anteriior UE B, chest head and neck.                 PT Short Term Goals - 11/30/18 1611      PT SHORT TERM GOAL #1   Title  PT to have decreased volumes by 2 cm in UE; 1 cm in cervical    Time  2    Period  Weeks    Status  Partially Met    Target Date  12/04/18        PT Long Term Goals - 11/30/18 1611      PT LONG TERM GOAL #1   Title  PT to be I in self manual techniques for chest, neck , face and UE.    Time  4    Period  Weeks    Status  Achieved      PT LONG TERM GOAL #2   Title  Pt to  have obtained and to be able to don UE compression sleeves    Time  4    Period  Weeks    Status  On-going            Plan - 11/30/18 1609    Clinical Impression Statement  PT order for compression garments came in; pt measured both for garments and for volumes.  PT given measurments and sheet with places that she may obtain her garments from.  Pt voumes overall have reduced nicely.    Personal Factors and Comorbidities  Comorbidity 3+;Fitness;Social Background;Transportation;Time since onset of injury/illness/exacerbation    Comorbidities  HEar failure, COPD, DM, HTN, B LE Lymphedema.    Examination-Participation Restrictions  Cleaning    Stability/Clinical Decision Making  Evolving/Moderate complexity    Rehab Potential  Fair    PT Frequency  3x / week    PT Duration  4 weeks    PT Treatment/Interventions  ADLs/Self Care Home Management;Manual lymph drainage;Compression bandaging;Patient/family education;Therapeutic exercise    PT Next Visit Plan  Continue with manual lymphatic drainage for UE, head and neck    PT Home Exercise Plan  UE ROM to improve lymphatic flow, using cervical compression made by the therapist.    Consulted and Agree with Plan of Care  Patient       Patient will benefit from skilled therapeutic intervention in order to improve the following deficits and impairments:  Increased edema, Decreased skin integrity, Pain  Visit Diagnosis: Lymphedema, not elsewhere classified     Problem List Patient Active Problem List   Diagnosis Date Noted  . PMB (postmenopausal bleeding) 10/06/2016  . Chest pressure 01/05/2015  . Gastric erosion   . Encounter for screening colonoscopy 01/22/2014  . Constipation 01/20/2014  . GERD (gastroesophageal reflux disease) 01/20/2014  . Dyspepsia 01/20/2014  . SVT (supraventricular tachycardia) ([place]) 05/31/2013  .  HTN (hypertension), benign 05/31/2013  . Hypercholesteremia 05/31/2013  . COPD (chronic obstructive pulmonary  disease) ([place]) 05/31/2013  . Cervical stenosis of spine 05/31/2013  . Peripheral neuropathy 05/31/2013    Rayetta Humphrey, PT CLT [phone] 11/30/2018, 4:12 PM  [place] [address] [place], [place], [address] Phone: [phone]   Fax:  [phone]  Name: Gina Bradley MRN: [id number] Date of Birth: 26-Aug-1960

## 2018-12-03 ENCOUNTER — Telehealth (HOSPITAL_COMMUNITY): Payer: Self-pay | Admitting: Physical Therapy

## 2018-12-03 ENCOUNTER — Ambulatory Visit (HOSPITAL_COMMUNITY): Payer: Medicare Other | Admitting: Physical Therapy

## 2018-12-03 NOTE — Telephone Encounter (Signed)
pt cancelled due to she is not feeling well

## 2018-12-04 DIAGNOSIS — E7849 Other hyperlipidemia: Secondary | ICD-10-CM | POA: Diagnosis not present

## 2018-12-04 DIAGNOSIS — G894 Chronic pain syndrome: Secondary | ICD-10-CM | POA: Diagnosis not present

## 2018-12-04 DIAGNOSIS — J449 Chronic obstructive pulmonary disease, unspecified: Secondary | ICD-10-CM | POA: Diagnosis not present

## 2018-12-04 DIAGNOSIS — I10 Essential (primary) hypertension: Secondary | ICD-10-CM | POA: Diagnosis not present

## 2018-12-04 DIAGNOSIS — K219 Gastro-esophageal reflux disease without esophagitis: Secondary | ICD-10-CM | POA: Diagnosis not present

## 2018-12-05 ENCOUNTER — Encounter (HOSPITAL_COMMUNITY): Payer: Self-pay | Admitting: Physical Therapy

## 2018-12-05 ENCOUNTER — Other Ambulatory Visit: Payer: Self-pay

## 2018-12-05 ENCOUNTER — Ambulatory Visit (HOSPITAL_COMMUNITY): Payer: Medicare Other | Attending: Internal Medicine | Admitting: Physical Therapy

## 2018-12-05 DIAGNOSIS — I890 Lymphedema, not elsewhere classified: Secondary | ICD-10-CM | POA: Diagnosis not present
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
## 2018-12-06 ENCOUNTER — Telehealth (HOSPITAL_COMMUNITY): Payer: Self-pay | Admitting: Physical Therapy

## 2018-12-06 NOTE — Telephone Encounter (Signed)
Pt called to confirm sleeve ml 20/30 was correct. Looked at order and confirmed.

## 2018-12-07 ENCOUNTER — Ambulatory Visit (HOSPITAL_COMMUNITY): Payer: Medicare Other | Admitting: Physical Therapy

## 2018-12-07 ENCOUNTER — Other Ambulatory Visit: Payer: Self-pay

## 2018-12-07 ENCOUNTER — Encounter (HOSPITAL_COMMUNITY): Payer: Self-pay | Admitting: Physical Therapy

## 2018-12-07 DIAGNOSIS — I890 Lymphedema, not elsewhere classified: Secondary | ICD-10-CM

## 2018-12-07 NOTE — Therapy (Signed)
[facility] [address] [place], [place], [address] Phone: [phone]   Fax:  [phone]  Physical Therapy Treatment  Patient Details  Name: Gina Bradley MRN: [id number] Date of Birth: 1961-01-02 Referring Provider (PT): Cristopher Peru   Encounter Date: 12/07/2018  PT End of Session - 12/07/18 1502    Visit Number  6    Number of Visits  12    Date for PT Re-Evaluation  12/20/18    Authorization Type  [employer] MEdic[employer]    Authorization - Visit Number  6    Authorization - Number of Visits  10    PT Start Time  6144    PT Stop Time  1459    PT Time Calculation (min)  44 min    Equipment Utilized During Treatment  Other (comment)    Activity Tolerance  Patient tolerated treatment well    Behavior During Therapy  [facility] for tasks assessed/performed       Past Medical History:  Diagnosis Date  . Anemia   . Cervical disc disease   . COPD (chronic obstructive pulmonary disease) ([place])   . Diabetes mellitus without complication ([place])   . Essential hypertension   . GERD (gastroesophageal reflux disease)   . Heart failure ([place]) 2018  . History of cardiac catheterization    Normal coronaries March 2015  . History of non-ST elevation myocardial infarction (NSTEMI)    Secondary to SVT  . Lymphedema   . Peripheral neuropathy   . Spinal stenosis   . SVT (supraventricular tachycardia) ([facility])     Past Surgical History:  Procedure Laterality Date  . CESAREAN SECTION    . ESOPHAGOGASTRODUODENOSCOPY N/A 02/10/2014   RXV:[id number] tiny antral erosion; otherwise normal EGD. No explanation for patient's symptoms. Gallbladder needs further evaluation.  [name] LEFT HEART CATHETERIZATION WITH CORONARY ANGIOGRAM N/A 06/03/2013   Procedure: LEFT HEART CATHETERIZATION WITH CORONARY ANGIOGRAM;  Surgeon: Sinclair Grooms, MD;  Location: [facility] CATH LAB;  Service: Cardiovascular;  Laterality: N/A;    There were no vitals filed for this visit.  Subjective Assessment  - 12/07/18 1422    Subjective  Pt ordered her arm garments this morning.    Pertinent History  Heart failure, COPD, DM, HTN.    How long can you sit comfortably?  no problem    How long can you stand comfortably?  20-30 minutes    Patient Stated Goals  To get some fluid out.            LYMPHEDEMA/ONCOLOGY QUESTIONNAIRE - 12/07/18 1422      Right Upper Extremity Lymphedema   15 cm Proximal to Olecranon Process  39.5 cm    10 cm Proximal to Olecranon Process  35.7 cm    Olecranon Process  28.7 cm    15 cm Proximal to Ulnar Styloid Process  28 cm    10 cm Proximal to Ulnar Styloid Process  24.3 cm    Just Proximal to Ulnar Styloid Process  16 cm    Across Hand at [employer]  17.8 cm      Left Upper Extremity Lymphedema   15 cm Proximal to Olecranon Process  40 cm    10 cm Proximal to Olecranon Process  37.3 cm    Olecranon Process  29.3 cm    15 cm Proximal to Ulnar Styloid Process  27.9 cm    10 cm Proximal to Ulnar Styloid Process  24.7 cm    Just  Proximal to Ulnar Styloid Process  16.5 cm    Across Hand at [employer]  17.5 cm      Head and Neck   Right Lateral Nostril at base of nose to medial tragus   10 cm    Left Lateral Nostril at base of nose to medial tragus   9.3 cm    Right Corner of mouth to where ear lobe meets face  9.1 cm    Left Corner of mouth to where ear lobe meets face  8.9 cm    4 cm superior to sternal notch around neck  44 cm    6 cm superior to sternal notch around neck  44.5 cm          Manual decongestive techniques for chest, head and neck only due to pt  Being late and needing to be measured.                 PT Short Term Goals - 11/30/18 1611      PT SHORT TERM GOAL #1   Title  PT to have decreased volumes by 2 cm in UE; 1 cm in cervical    Time  2    Period  Weeks    Status  Partially Met    Target Date  12/04/18        PT Long Term Goals - 11/30/18 1611      PT LONG TERM GOAL #1   Title  PT to be I in  self manual techniques for chest, neck , face and UE.    Time  4    Period  Weeks    Status  Achieved      PT LONG TERM GOAL #2   Title  Pt to have obtained and to be able to don UE compression sleeves    Time  4    Period  Weeks    Status  On-going            Plan - 12/07/18 1503    Clinical Impression Statement  Pt has no questions on self massage, should have her UE garments next week.  Measured today with overall reduction.  Pt should be able to be discharged after next week    Personal Factors and Comorbidities  Comorbidity 3+;Fitness;Social Background;Transportation;Time since onset of injury/illness/exacerbation    Comorbidities  HEar failure, COPD, DM, HTN, B LE Lymphedema.    Examination-Participation Restrictions  Cleaning    Stability/Clinical Decision Making  Evolving/Moderate complexity    Rehab Potential  Fair    PT Frequency  3x / week    PT Duration  4 weeks    PT Treatment/Interventions  ADLs/Self Care Home Management;Manual lymph drainage;Compression bandaging;Patient/family education;Therapeutic exercise    PT Next Visit Plan  Continue with manual lymphatic drainage for UE, head and neck;    PT Home Exercise Plan  UE ROM to improve lymphatic flow, using cervical compression made by the therapist.    Consulted and Agree with Plan of Care  Patient       Patient will benefit from skilled therapeutic intervention in order to improve the following deficits and impairments:  Increased edema, Decreased skin integrity, Pain  Visit Diagnosis: Lymphedema, not elsewhere classified     Problem List Patient Active Problem List   Diagnosis Date Noted  . PMB (postmenopausal bleeding) 10/06/2016  . Chest pressure 01/05/2015  . Gastric erosion   . Encounter for screening colonoscopy 01/22/2014  . Constipation 01/20/2014  .  GERD (gastroesophageal reflux disease) 01/20/2014  . Dyspepsia 01/20/2014  . SVT (supraventricular tachycardia) ([place]) 05/31/2013  . HTN  (hypertension), benign 05/31/2013  . Hypercholesteremia 05/31/2013  . COPD (chronic obstructive pulmonary disease) ([place]) 05/31/2013  . Cervical stenosis of spine 05/31/2013  . Peripheral neuropathy 05/31/2013    Rayetta Humphrey, PT CLT [phone] 12/07/2018, 3:05 PM  [facility] [address] [place], [place], [address] Phone: [phone]   Fax:  [phone]  Name: Gina Bradley MRN: [id number] Date of Birth: [birth year]

## 2018-12-11 ENCOUNTER — Telehealth (HOSPITAL_COMMUNITY): Payer: Self-pay | Admitting: Physical Therapy

## 2018-12-11 ENCOUNTER — Ambulatory Visit (HOSPITAL_COMMUNITY): Payer: Medicare Other | Admitting: Physical Therapy

## 2018-12-11 NOTE — Telephone Encounter (Signed)
She called and said she could not come today since her sugar is too high and she feels bad.

## 2018-12-13 ENCOUNTER — Telehealth (HOSPITAL_COMMUNITY): Payer: Self-pay | Admitting: Physical Therapy

## 2018-12-13 ENCOUNTER — Other Ambulatory Visit: Payer: Self-pay

## 2018-12-13 ENCOUNTER — Ambulatory Visit (HOSPITAL_COMMUNITY): Payer: Medicare Other | Admitting: Physical Therapy

## 2018-12-13 DIAGNOSIS — I890 Lymphedema, not elsewhere classified: Secondary | ICD-10-CM | POA: Diagnosis not present

## 2018-12-13 NOTE — Telephone Encounter (Signed)
Began to call pt re no show when she came into the department 15 minutes late.  Rayetta Humphrey, [place] CLT [phone]

## 2018-12-13 NOTE — Therapy (Signed)
[facility] [place], [place], [address] Phone: [phone]   Fax:  [phone]  Physical Therapy Treatment  Patient Details  Name: Gina Bradley MRN: [id number] Date of Birth: 01/24/1961 Referring Provider (PT): Cristopher Peru PHYSICAL THERAPY DISCHARGE SUMMARY  Visits from Start of Care: 7  Current functional level related to goals / functional outcomes: Pt has and is wearing compression garments, completing self manual and using pump.   Remaining deficits: PT is not using pump everyday, although she knows that she needs to and is not completing the exercises on A regular basis.    Education / Equipment: The importance of using the pump everyday as well as completing the exercises everyday.   Plan: Patient agrees to discharge.  Patient goals were not met. Patient is being discharged due to meeting the stated rehab goals.  ?????     Encounter Date: 12/13/2018  PT End of Session - 12/13/18 1527    Visit Number  7    Number of Visits  7    Date for PT Re-Evaluation  12/20/18    Authorization Type  [employer]    Authorization - Visit Number  7    Authorization - Number of Visits  7    PT Start Time  1330    PT Stop Time  1417    PT Time Calculation (min)  47 min    Equipment Utilized During Treatment  Other (comment)    Activity Tolerance  Patient tolerated treatment well    Behavior During Therapy  WFL for tasks assessed/performed       Past Medical History:  Diagnosis Date  . Anemia   . Cervical disc disease   . COPD (chronic obstructive pulmonary disease) ([facility])   . Diabetes mellitus without complication ([place])   . Essential hypertension   . GERD (gastroesophageal reflux disease)   . Heart failure ([facility] [facility]) 2018  . History of cardiac catheterization    Normal coronaries March 2015  . History of non-ST elevation myocardial infarction (NSTEMI)    Secondary to SVT  . Lymphedema   . Peripheral neuropathy   .  Spinal stenosis   . SVT (supraventricular tachycardia) ([facility])     Past Surgical History:  Procedure Laterality Date  . CESAREAN SECTION    . ESOPHAGOGASTRODUODENOSCOPY N/A 02/10/2014   INO:[facility] tiny antral erosion; otherwise normal EGD. No explanation for patient's symptoms. Gallbladder needs further evaluation.  [name] LEFT HEART CATHETERIZATION WITH CORONARY ANGIOGRAM N/A 06/03/2013   Procedure: LEFT HEART CATHETERIZATION WITH CORONARY ANGIOGRAM;  Surgeon: Sinclair Grooms, MD;  Location: [facility] CATH LAB;  Service: Cardiovascular;  Laterality: N/A;    There were no vitals filed for this visit.  Subjective Assessment - 12/13/18 1526    Subjective  PT 15 minutes late for appointment but comes with her UE garments.    Pertinent History  Heart failure, COPD, DM, HTN.    How long can you sit comfortably?  no problem    How long can you stand comfortably?  20-30 minutes    Patient Stated Goals  To get some fluid out.    Currently in Pain?  No/denies            LYMPHEDEMA/ONCOLOGY QUESTIONNAIRE - 12/13/18 1426      Right Upper Extremity Lymphedema   15 cm Proximal to Olecranon Process  39.5 cm   initial 40.8   10 cm Proximal to Olecranon Process  35.5 cm   inital  36.8   Olecranon Process  27.8 cm   29.5   15 cm Proximal to Ulnar Styloid Process  27.9 cm   29.1   10 cm Proximal to Ulnar Styloid Process  24.3 cm   25.9   Just Proximal to Ulnar Styloid Process  16 cm   18   Across Hand at [employer]  17.8 cm   19.3     Left Upper Extremity Lymphedema   15 cm Proximal to Olecranon Process  39.5 cm   41   10 cm Proximal to Olecranon Process  36.8 cm   38.4   Olecranon Process  28.5 cm   30.2   15 cm Proximal to Ulnar Styloid Process  27.5 cm   28.9   10 cm Proximal to Ulnar Styloid Process  25 cm   27   Just Proximal to Ulnar Styloid Process  16.3 cm   17.8   Across Hand at [employer]  17.5 cm   19     Head and Neck   Right Lateral Nostril at base of nose to  medial tragus   9.5 cm   11.1   Left Lateral Nostril at base of nose to medial tragus   9.1 cm   9.5   Right Corner of mouth to where ear lobe meets face  9.3 cm   10.2   Left Corner of mouth to where ear lobe meets face  9 cm   10.2   4 cm superior to sternal notch around neck  42 cm   43.8   6 cm superior to sternal notch around neck  43.5 cm   44.5               [facility] Adult PT Treatment/Exercise - 12/13/18 0001      Manual Therapy   Manual Therapy  Manual Lymphatic Drainage (MLD)    Manual Lymphatic Drainage (MLD)  To include supraclavicular, deep and superfical abdominal , anteriior UE B, chest head and neck.                 PT Short Term Goals - 12/13/18 1531      PT SHORT TERM GOAL #1   Title  PT to have decreased volumes by 2 cm in UE; 1 cm in cervical    Time  2    Period  Weeks    Status  Achieved    Target Date  12/04/18        PT Long Term Goals - 12/13/18 1531      PT LONG TERM GOAL #1   Title  PT to be I in self manual techniques for chest, neck , face and UE.    Time  4    Period  Weeks    Status  Achieved      PT LONG TERM GOAL #2   Title  Pt to have obtained and to be able to don UE compression sleeves    Time  4    Period  Weeks    Status  Achieved   but will reorder so she has two x large which is her size.           Plan - 12/13/18 1528    Clinical Impression Statement  PT was sent two different size for UE one LG knuckle to axilla, one x-lg knuckle to axilla.  PT liked the knuckle to axilla best and fits in the x-lg as the Lg is  causing to much compression.  Pt measured and measurments area staying constant.  Pt has been educated in self massage and the importance of using compression.  PT is ready to be discharged at this time.    Personal Factors and Comorbidities  Comorbidity 3+;Fitness;Social Background;Transportation;Time since onset of injury/illness/exacerbation    Comorbidities  HEar failure, COPD, DM, HTN, B LE  Lymphedema.    Examination-Participation Restrictions  Cleaning    Stability/Clinical Decision Making  Evolving/Moderate complexity    Rehab Potential  Fair    PT Frequency  3x / week    PT Duration  4 weeks    PT Treatment/Interventions  ADLs/Self Care Home Management;Manual lymph drainage;Compression bandaging;Patient/family education;Therapeutic exercise    PT Next Visit Plan  Discharge.    PT Home Exercise Plan  UE ROM to improve lymphatic flow, using cervical compression made by the therapist.    Consulted and Agree with Plan of Care  Patient       Patient will benefit from skilled therapeutic intervention in order to improve the following deficits and impairments:  Increased edema, Decreased skin integrity, Pain  Visit Diagnosis: Lymphedema, not elsewhere classified     Problem List Patient Active Problem List   Diagnosis Date Noted  . PMB (postmenopausal bleeding) 10/06/2016  . Chest pressure 01/05/2015  . Gastric erosion   . Encounter for screening colonoscopy 01/22/2014  . Constipation 01/20/2014  . GERD (gastroesophageal reflux disease) 01/20/2014  . Dyspepsia 01/20/2014  . SVT (supraventricular tachycardia) ([facility]) 05/31/2013  . HTN (hypertension), benign 05/31/2013  . Hypercholesteremia 05/31/2013  . COPD (chronic obstructive pulmonary disease) ([facility]) 05/31/2013  . Cervical stenosis of spine 05/31/2013  . Peripheral neuropathy 05/31/2013    Rayetta Humphrey, PT CLT [phone] 12/13/2018, 4:28 PM  [facility] [address] [place], [place], [address] Phone: [phone]   Fax:  [phone]  Name: Gina Bradley MRN: [id number] Date of Birth: 04/27/1960

## 2018-12-17 ENCOUNTER — Ambulatory Visit (HOSPITAL_COMMUNITY): Payer: Medicare Other | Admitting: Physical Therapy

## 2018-12-19 ENCOUNTER — Encounter (HOSPITAL_COMMUNITY): Payer: Medicare Other | Admitting: Physical Therapy

## 2018-12-21 ENCOUNTER — Encounter (HOSPITAL_COMMUNITY): Payer: Medicare Other | Admitting: Physical Therapy

## 2018-12-24 ENCOUNTER — Encounter (HOSPITAL_COMMUNITY): Payer: Medicare Other | Admitting: Physical Therapy

## 2018-12-25 ENCOUNTER — Other Ambulatory Visit: Payer: Self-pay

## 2018-12-25 ENCOUNTER — Ambulatory Visit (INDEPENDENT_AMBULATORY_CARE_PROVIDER_SITE_OTHER): Payer: Medicare Other | Admitting: Obstetrics and Gynecology

## 2018-12-25 ENCOUNTER — Other Ambulatory Visit (HOSPITAL_COMMUNITY)
Admission: RE | Admit: 2018-12-25 | Discharge: 2018-12-25 | Disposition: A | Discharge status: Home / Self Care | Payer: Medicare Other | Source: Ambulatory Visit | Attending: Obstetrics and Gynecology | Admitting: Obstetrics and Gynecology

## 2018-12-25 ENCOUNTER — Encounter: Payer: Self-pay | Admitting: Obstetrics and Gynecology

## 2018-12-25 VITALS — BP 108/78 | HR 83 | Ht 61.0 in | Wt 214.0 lb

## 2018-12-25 DIAGNOSIS — Z01419 Encounter for gynecological examination (general) (routine) without abnormal findings: Secondary | ICD-10-CM | POA: Diagnosis present

## 2018-12-25 NOTE — Progress Notes (Signed)
Patient ID: Gina Bradley, female   DOB: 08-05-1960, 58 y.o.   MRN: [id number]  Assessment:  Annual Gyn Exam DM, uncontrolled COPD  Obesity Body mass index is 40.43 kg/m.  Plan:  1. pap smear done, next pap due  3 years 2. return annually or prn 3    Dietician recommendation Subjective:  Gina Bradley is a 58 y.o. female G2P2002 who presents for annual exam. Patient's last menstrual period was 01/05/2015. The patient has complaints today of urinary incontinence. Has umbilical hernia and feels as if her uterus has dropped and " feels it in her abdomen". Has COPD stage 3 and DM. Says that she doesn't eat much but drinks sweet tea often during her day. Her weight is at the highest weight it has ever been. Wants to lose weight. Is not working  Has been severely inactive since her MI 2 yr ago.  The following portions of the patient's history were reviewed and updated as appropriate: allergies, current medications, past family history, past medical history, past social history, past surgical history and problem list. Past Medical History:  Diagnosis Date  . Anemia   . Cervical disc disease   . COPD (chronic obstructive pulmonary disease) ([facility])   . Diabetes mellitus without complication ([facility])   . Essential hypertension   . GERD (gastroesophageal reflux disease)   . Heart failure ([facility]) 2018  . History of cardiac catheterization    Normal coronaries March 2015  . History of non-ST elevation myocardial infarction (NSTEMI)    Secondary to SVT  . Lymphedema   . Peripheral neuropathy   . Spinal stenosis   . SVT (supraventricular tachycardia) ([facility])     Past Surgical History:  Procedure Laterality Date  . CESAREAN SECTION    . ESOPHAGOGASTRODUODENOSCOPY N/A 02/10/2014   WUJ:WJXBJY tiny antral erosion; otherwise normal EGD. No explanation for patient's symptoms. Gallbladder needs further evaluation.  [name] LEFT HEART CATHETERIZATION WITH CORONARY ANGIOGRAM N/A 06/03/2013   Procedure: LEFT  HEART CATHETERIZATION WITH CORONARY ANGIOGRAM;  Surgeon: Lesleigh Noe, MD;  Location: [facility] CATH LAB;  Service: Cardiovascular;  Laterality: N/A;     Current Outpatient Medications:  .  albuterol (PROVENTIL HFA;VENTOLIN HFA) 108 (90 Base) MCG/ACT inhaler, Inhale into the lungs every 6 (six) hours as needed for wheezing or shortness of breath., Disp: , Rfl:  .  albuterol (PROVENTIL) (2.5 MG/3ML) 0.083% nebulizer solution, Take 2.5 mg by nebulization 4 (four) times daily., Disp: , Rfl:  .  ANORO ELLIPTA 62.5-25 MCG/INH AEPB, Inhale 1 puff into the lungs daily. , Disp: , Rfl: 11 .  aspirin EC 81 MG EC tablet, Take 1 tablet (81 mg total) by mouth daily., Disp: , Rfl:  .  atorvastatin (LIPITOR) 20 MG tablet, Take 20 mg by mouth every evening. , Disp: , Rfl:  .  co-enzyme Q-10 30 MG capsule, Take 100 mg by mouth daily., Disp: , Rfl:  .  EPINEPHrine (EPIPEN 2-PAK) 0.3 mg/0.3 mL IJ SOAJ injection, Inject 0.3 mLs (0.3 mg total) into the muscle once as needed (for severe allergic reaction). CAll 911 immediately if you have to use this medicine, Disp: 2 Device, Rfl: 1 .  furosemide (LASIX) 20 MG tablet, TAKE (1) TABLET BY MOUTH 2 TIMES DAILY AS NEEDED., Disp: 60 tablet, Rfl: 3 .  gabapentin (NEURONTIN) 100 MG capsule, Take 100 mg by mouth 3 (three) times daily., Disp: , Rfl:  .  HYDROcodone-acetaminophen (NORCO) 10-325 MG per tablet, Take 1 tablet by mouth every 6 (six)  hours as needed. pain, Disp: , Rfl:  .  ibuprofen (ADVIL,MOTRIN) 600 MG tablet, Take 1 tablet (600 mg total) by mouth 4 (four) times daily., Disp: 30 tablet, Rfl: 0 .  metFORMIN (GLUCOPHAGE) 500 MG tablet, Take 500 mg by mouth as needed., Disp: , Rfl: 10 .  methocarbamol (ROBAXIN) 500 MG tablet, Take 1 tablet (500 mg total) by mouth 3 (three) times daily., Disp: 21 tablet, Rfl: 0 .  metoprolol succinate (TOPROL-XL) 100 MG 24 hr tablet, TAKE (1) TABLET BY MOUTH ONCE DAILY., Disp: 30 tablet, Rfl: 9 .  nitroGLYCERIN (NITROSTAT) 0.4 MG SL  tablet, Place 1 tablet (0.4 mg total) under the tongue every 5 (five) minutes as needed for chest pain., Disp: 10 tablet, Rfl: 0 .  Oxycodone HCl 10 MG TABS, Take 5-10 mg by mouth daily as needed (for pain). , Disp: , Rfl: 0 .  pantoprazole (PROTONIX) 40 MG tablet, Take 40 mg by mouth 2 (two) times daily., Disp: , Rfl:  .  promethazine (PHENERGAN) 25 MG tablet, Take 25 mg by mouth every 6 (six) hours as needed for nausea or vomiting., Disp: , Rfl:  .  TOVIAZ 4 MG TB24 tablet, Take 4 mg by mouth daily. , Disp: , Rfl: 10 .  triamcinolone cream (KENALOG) 0.1 %, Apply 1 application topically as needed (ITCHING/IRRITATION). , Disp: , Rfl: 2  Review of Systems Constitutional: negative Gastrointestinal: positive for umbilical hernia Genitourinary: some incontinence  Objective:  LMP 01/05/2015    BMI: There is no height or weight on file to calculate BMI.  General Appearance: Alert, appropriate appearance for age. No acute distress HEENT: Grossly normal Neck / Thyroid:  Cardiovascular: RRR; normal S1, S2, no murmur Lungs: CTA bilaterally Back: No CVAT Breast Exam: Not examined Gastrointestinal: Soft, non-tender, no masses or organomegaly 1.5 cm reducible umbilical hernia. Pelvic Exam:   VAGINA: normal appearing vagina  CERVIX: normal appearing cervix  UTERUS: well supported very high, difficult to see PAP: Pap smear done today. Lymphatic Exam: Non-palpable nodes in neck, clavicular, axillary, or inguinal regions Skin: no rash or abnormalities Neurologic: Normal gait and speech, no tremor  Psychiatric: Alert and oriented, appropriate affect.  Urinalysis:Not done  By signing my name below, I, Samul Dada, attest that this documentation has been prepared under the direction and in the presence of Jonnie Kind, MD. Electronically Signed: Samul Dada Medical Scribe. 12/25/18. 2:41 PM.  I personally performed the services described in this documentation, which was SCRIBED in my presence.  The recorded information has been reviewed and considered accurate. It has been edited as necessary during review. Jonnie Kind, MD

## 2018-12-26 ENCOUNTER — Encounter (HOSPITAL_COMMUNITY): Payer: Medicare Other | Admitting: Physical Therapy

## 2018-12-27 LAB — CYTOLOGY - PAP
Adequacy: ABSENT
Diagnosis: NEGATIVE
High risk HPV: NEGATIVE
Molecular Disclaimer: 56
Molecular Disclaimer: DETECTED
Molecular Disclaimer: NORMAL

## 2018-12-28 ENCOUNTER — Encounter (HOSPITAL_COMMUNITY): Payer: Medicare Other | Admitting: Physical Therapy

## 2018-12-31 ENCOUNTER — Encounter (HOSPITAL_COMMUNITY): Payer: Medicare Other | Admitting: Physical Therapy

## 2019-01-02 ENCOUNTER — Encounter (HOSPITAL_COMMUNITY): Payer: Medicare Other | Admitting: Physical Therapy

## 2019-01-03 DIAGNOSIS — I10 Essential (primary) hypertension: Secondary | ICD-10-CM | POA: Diagnosis not present

## 2019-01-03 DIAGNOSIS — J449 Chronic obstructive pulmonary disease, unspecified: Secondary | ICD-10-CM | POA: Diagnosis not present

## 2019-01-08 DIAGNOSIS — K219 Gastro-esophageal reflux disease without esophagitis: Secondary | ICD-10-CM | POA: Diagnosis not present

## 2019-01-08 DIAGNOSIS — K429 Umbilical hernia without obstruction or gangrene: Secondary | ICD-10-CM | POA: Diagnosis not present

## 2019-01-08 DIAGNOSIS — J449 Chronic obstructive pulmonary disease, unspecified: Secondary | ICD-10-CM | POA: Diagnosis not present

## 2019-01-08 DIAGNOSIS — I10 Essential (primary) hypertension: Secondary | ICD-10-CM | POA: Diagnosis not present

## 2019-02-11 ENCOUNTER — Other Ambulatory Visit: Payer: Self-pay

## 2019-02-11 DIAGNOSIS — G894 Chronic pain syndrome: Secondary | ICD-10-CM | POA: Diagnosis not present

## 2019-02-11 DIAGNOSIS — J449 Chronic obstructive pulmonary disease, unspecified: Secondary | ICD-10-CM | POA: Diagnosis not present

## 2019-02-11 NOTE — Patient Outreach (Signed)
[facility]) Care Management  02/11/2019  Gina Bradley Aug 04, 1960 [id number]   Medication Adherence call to [name] spoke with patient she is past due on Atorvastatin 20 mg,patient explain she takes 1 tablet daily but, sometimes she forgets to take ,patient ask to call pharmacy and order this medication ,pharmacy will have it ready for patient to pick up. [name] is showing past due under [facility].   [facility] Management Direct Dial [phone]  Fax [phone] [email or web address]

## 2019-02-19 ENCOUNTER — Telehealth: Payer: Self-pay | Admitting: Cardiology

## 2019-02-19 NOTE — Telephone Encounter (Signed)
Patient returning someones call °

## 2019-02-19 NOTE — Telephone Encounter (Signed)
Pt was returning Stephanie's call from 11/10 as she was trying to schedule an appointment with [facility]. Pt does not want to make a follow up appointment with Dr. Domenic Polite right now. She will call to schedule soon.

## 2019-03-12 DIAGNOSIS — J449 Chronic obstructive pulmonary disease, unspecified: Secondary | ICD-10-CM | POA: Diagnosis not present

## 2019-03-12 DIAGNOSIS — I10 Essential (primary) hypertension: Secondary | ICD-10-CM | POA: Diagnosis not present

## 2019-03-12 DIAGNOSIS — G250 Essential tremor: Secondary | ICD-10-CM | POA: Diagnosis not present

## 2019-03-12 DIAGNOSIS — R201 Hypoesthesia of skin: Secondary | ICD-10-CM | POA: Diagnosis not present

## 2019-03-12 DIAGNOSIS — E1140 Type 2 diabetes mellitus with diabetic neuropathy, unspecified: Secondary | ICD-10-CM | POA: Diagnosis not present

## 2019-03-12 DIAGNOSIS — L84 Corns and callosities: Secondary | ICD-10-CM | POA: Diagnosis not present

## 2019-03-12 DIAGNOSIS — I110 Hypertensive heart disease with heart failure: Secondary | ICD-10-CM | POA: Diagnosis not present

## 2019-04-10 DIAGNOSIS — G894 Chronic pain syndrome: Secondary | ICD-10-CM | POA: Diagnosis not present

## 2019-04-10 DIAGNOSIS — Z1389 Encounter for screening for other disorder: Secondary | ICD-10-CM | POA: Diagnosis not present

## 2019-04-10 DIAGNOSIS — K219 Gastro-esophageal reflux disease without esophagitis: Secondary | ICD-10-CM | POA: Diagnosis not present

## 2019-04-10 DIAGNOSIS — E782 Mixed hyperlipidemia: Secondary | ICD-10-CM | POA: Diagnosis not present

## 2019-04-10 DIAGNOSIS — E1140 Type 2 diabetes mellitus with diabetic neuropathy, unspecified: Secondary | ICD-10-CM | POA: Diagnosis not present

## 2019-04-10 DIAGNOSIS — Z0001 Encounter for general adult medical examination with abnormal findings: Secondary | ICD-10-CM | POA: Diagnosis not present

## 2019-04-10 DIAGNOSIS — J449 Chronic obstructive pulmonary disease, unspecified: Secondary | ICD-10-CM | POA: Diagnosis not present

## 2019-04-10 DIAGNOSIS — I10 Essential (primary) hypertension: Secondary | ICD-10-CM | POA: Diagnosis not present

## 2019-04-18 ENCOUNTER — Telehealth: Payer: Self-pay | Admitting: Physician Assistant

## 2019-04-18 NOTE — Progress Notes (Signed)
Virtual Visit via Telephone Note   This visit type was conducted due to national recommendations for restrictions regarding the COVID-19 Pandemic (e.g. social distancing) in an effort to limit this patient's exposure and mitigate transmission in our community.  Due to her co-morbid illnesses, this patient is at least at moderate risk for complications without adequate follow up.  This format is felt to be most appropriate for this patient at this time.  The patient did not have access to video technology/had technical difficulties with video requiring transitioning to audio format only (telephone).  All issues noted in this document were discussed and addressed.  No physical exam could be performed with this format.  Please refer to the patient's chart for her  consent to telehealth for [facility].  Evaluation Performed:  Follow-up visit  This visit type was conducted due to national recommendations for restrictions regarding the COVID-19 Pandemic (e.g. social distancing).  This format is felt to be most appropriate for this patient at this time.  All issues noted in this document were discussed and addressed.  No physical exam was performed (except for noted visual exam findings with Video Visits).  Please refer to the patient's chart (MyChart message for video visits and phone note for telephone visits) for the patient's consent to telehealth for [facility].  Date:  04/19/2019   ID:  Gina Bradley, DOB 1960/12/06, MRN [id number]  Patient Location:  Home  Provider location:   Office  PCP:  Elfredia Nevins, MD  Cardiologist:  Nona Dell, MD 07/10/2017 Electrophysiologist:  Lewayne Bunting, MD 05/10/2018  Chief Complaint:  Follow up 1 yr  History of Present Illness:    Gina Bradley is a 59 y.o. female who presents via audio/video conferencing for a telehealth visit today.    59 y.o. yo female who has a hx of SVT, CP w/ nl cors 2015, DM, HTN, HLD, COPD, GERD,  lymphedema  She feels pressure in her chest when she wakes up. Feels pressure behind her eyes as well. She felt her bones were going to pop open.   The sx began last Thursday, has only skipped one day since then. She had been taking only 1/2 Primidone qhs, took a whole one. That did help.  When she gets these sx, she will use her inhalers, then start with her stomach medicine. Then she will start taking her other medications. Her symptoms will gradually improve. She took nitro once, thinks it helped some but gave her a terrific headache.   She gets SOB w/ exertion, but it does not seem to bring on the chest pain. She will have to stop and rest frequently. The SOB will improve in a few minutes.  She has not been doing her lymphedema treatment as often as she should.   She plans to make a sheet of all her medications, try to get them organized. She is compliant with meds.   The left side of her lung feels heavy, she thinks she is getting bronchitis. She is going to see Dr Sherwood Gambler next week. She feels she needs cough medicine, cannot remember what she previously took, prescribed by Pulm MD, but it helped.   She admits she is struggling with anxiety, was on a med that did well for her, but was stopped.   She wonders if she is in heart failure. Her weight has been stable, does not wake w/ LE edema. DOE is severe but no acute change.  She did not realize her cholesterol was so high.   The patient does not have symptoms concerning for COVID-19 infection (fever, chills, cough, or new shortness of breath).    Prior CV studies:   The following studies were reviewed today:  Echocardiogram 11/27/2016  - Left ventricle: The cavity size was normal. Wall thickness was normal. Systolic function was vigorous. The estimated ejection fraction was in the range of 65% to 70%. Left ventricular diastolic function parameters were normal.   Past Medical History:  Diagnosis Date  . Anemia   .  Cervical disc disease   . COPD (chronic obstructive pulmonary disease) (HCC)   . Diabetes mellitus without complication (HCC)   . Essential hypertension   . GERD (gastroesophageal reflux disease)   . Heart failure (HCC) 2018  . History of cardiac catheterization    Normal coronaries March 2015  . History of non-ST elevation myocardial infarction (NSTEMI)    Secondary to SVT  . Lymphedema   . Peripheral neuropathy   . Spinal stenosis   . SVT (supraventricular tachycardia) (HCC)    Past Surgical History:  Procedure Laterality Date  . CESAREAN SECTION    . ESOPHAGOGASTRODUODENOSCOPY N/A 02/10/2014   CZY:SAYTKZ tiny antral erosion; otherwise normal EGD. No explanation for patient's symptoms. Gallbladder needs further evaluation.  [name] LEFT HEART CATHETERIZATION WITH CORONARY ANGIOGRAM N/A 06/03/2013   Procedure: LEFT HEART CATHETERIZATION WITH CORONARY ANGIOGRAM;  Surgeon: Lesleigh Noe, MD;  [facility] LAB;  Service: Cardiovascular;  Laterality: N/A;     Current Meds  Medication Sig  . albuterol (PROVENTIL HFA;VENTOLIN HFA) 108 (90 Base) MCG/ACT inhaler Inhale into the lungs every 6 (six) hours as needed for wheezing or shortness of breath.  [name] albuterol (PROVENTIL) (2.5 MG/3ML) 0.083% nebulizer solution Take 2.5 mg by nebulization 4 (four) times daily.  [name] ELLIPTA 62.5-25 MCG/INH AEPB Inhale 1 puff into the lungs daily.   [name] aspirin EC 81 MG EC tablet Take 1 tablet (81 mg total) by mouth daily.  [name] atorvastatin (LIPITOR) 20 MG tablet Take 20 mg by mouth every evening.   [name] co-enzyme Q-10 30 MG capsule Take 100 mg by mouth daily.  [name] EPINEPHrine (EPIPEN 2-PAK) 0.3 mg/0.3 mL IJ SOAJ injection Inject 0.3 mLs (0.3 mg total) into the muscle once as needed (for severe allergic reaction). CAll 911 immediately if you have to use this medicine  . furosemide (LASIX) 20 MG tablet TAKE (1) TABLET BY MOUTH 2 TIMES DAILY AS NEEDED.  [name] gabapentin (NEURONTIN) 100 MG capsule Take 200 mg by mouth  4 (four) times daily.   [name] HYDROcodone-acetaminophen (NORCO) 10-325 MG per tablet Take 1 tablet by mouth every 6 (six) hours as needed. pain  . ibuprofen (ADVIL,MOTRIN) 600 MG tablet Take 1 tablet (600 mg total) by mouth 4 (four) times daily.  . isosorbide mononitrate (ISMO) 10 MG tablet Take 10 mg by mouth daily.  [name] lisinopril (ZESTRIL) 2.5 MG tablet Take 2.5 mg by mouth daily.  . metFORMIN (GLUCOPHAGE) 500 MG tablet Take 500 mg by mouth as needed.  . methocarbamol (ROBAXIN) 500 MG tablet Take 1 tablet (500 mg total) by mouth 3 (three) times daily.  . metoprolol succinate (TOPROL-XL) 100 MG 24 hr tablet TAKE (1) TABLET BY MOUTH ONCE DAILY.  . nitroGLYCERIN (NITROSTAT) 0.4 MG SL tablet Place 1 tablet (0.4 mg total) under the tongue every 5 (five) minutes as needed for chest pain.  . Oxycodone HCl 10 MG TABS Take 5-10 mg by mouth  daily as needed (for pain).   . pantoprazole (PROTONIX) 40 MG tablet Take 40 mg by mouth 2 (two) times daily.  . primidone (MYSOLINE) 50 MG tablet TAKE (1) TABLET BY MOUTHTAT BEDTIME.  . promethazine (PHENERGAN) 25 MG tablet Take 25 mg by mouth every 6 (six) hours as needed for nausea or vomiting.  . TOVIAZ 4 MG TB24 tablet Take 4 mg by mouth daily.   [name] triamcinolone cream (KENALOG) 0.1 % Apply 1 application topically as needed (ITCHING/IRRITATION).      Allergies:   Bee venom and Tdap [tetanus-diphth-acell pertussis]   Social History   Tobacco Use  . Smoking status: Current Every Day Smoker    Packs/day: 0.50    Years: 18.00    Pack years: 9.00    Types: Cigarettes  . Smokeless tobacco: Never Used  Substance Use Topics  . Alcohol use: No    Alcohol/week: 0.0 standard drinks  . Drug use: No     Family Hx: The patient's family history includes Angina in her mother; Colon cancer in an other family member; Diabetes in her father, maternal grandfather, and mother; Heart attack in her mother; Heart failure in her brother and maternal grandmother; Hypertension in  her daughter, father, mother, and son; Stroke in her maternal grandmother; Suicidality in her sister.  ROS:   Please see the history of present illness.    All other systems reviewed and are negative.   Labs/Other Tests and Data Reviewed:    Recent Labs: 06/05/2018: Creatinine, Ser 0.90   CBC    Component Value Date/Time   WBC 12.1 (H) 11/01/2016 1840   RBC 4.29 11/01/2016 1840   HGB 14.1 11/01/2016 1840   HCT 42.5 11/01/2016 1840   PLT 282 11/01/2016 1840   MCV 99.1 11/01/2016 1840   MCH 32.9 11/01/2016 1840   MCHC 33.2 11/01/2016 1840   RDW 13.9 11/01/2016 1840   LYMPHSABS 1.7 11/01/2016 1840   MONOABS 0.6 11/01/2016 1840   EOSABS 0.0 11/01/2016 1840   BASOSABS 0.0 11/01/2016 1840    CMP Latest Ref Rng & Units 06/05/2018 11/01/2016 08/05/2016  Glucose 65 - 99 mg/dL - 914(N) -  BUN 6 - 20 mg/dL - 15 -  Creatinine 8.29 - 1.00 mg/dL 5.62 1.30 8.65  Sodium 135 - 145 mmol/L - 145 -  Potassium 3.5 - 5.1 mmol/L - 4.0 -  Chloride 101 - 111 mmol/L - 108 -  CO2 22 - 32 mmol/L - 28 -  Calcium 8.9 - 10.3 mg/dL - 9.9 -  Total Protein 6.0 - 8.3 g/dL - - -  Total Bilirubin 0.3 - 1.2 mg/dL - - -  Alkaline Phos 39 - 117 U/L - - -  AST 0 - 37 U/L - - -  ALT 0 - 35 U/L - - -   Lab Results  Component Value Date   HGBA1C 6.0 (H) 11/01/2016   Recent Lipid Panel Lab Results  Component Value Date/Time   CHOL 204 (H) 06/01/2013 04:38 AM   TRIG 60 06/01/2013 04:38 AM   HDL 76 06/01/2013 04:38 AM   CHOLHDL 2.7 06/01/2013 04:38 AM   LDLCALC 116 (H) 06/01/2013 04:38 AM   FROM [facility]: Cholesterol, total 291.000 m 12/04/2018 HDL 74.000 mg 12/04/2018 LDL 184.000 m 12/04/2018 Triglycerides 178.000 m 12/04/2018 A1C 5.900 03/12/2019 Hemoglobin 13.100 g/ 12/04/2018 Creatinine, Serum 0.690 MG/ 12/04/2018 Potassium 4.400 mm 12/04/2018  Wt Readings from Last 3 Encounters:  04/19/19 198 lb (89.8 kg)  12/25/18 214 lb (97.1 kg)  05/10/18 205 lb (93 kg)     Objective:    Vital Signs:  Ht 5\' 1"  (1.549 m)    Wt 198 lb (89.8 kg)   LMP 01/05/2015   BMI 37.41 kg/m    59 y.o. female in no acute distress over the phone. No problems speaking in complete sentences, does not sound SOB w/ conversation. No cough or wheezing.    ASSESSMENT & PLAN:    1.  Chest pain - sx are not exertional - although her mother had CAD, she has no hx - remote cath was clean - I was able to reassure her that her sx were not clearly cardiac - no symptoms of CHF except DOE. - However, weight is stable, no LE edema, no PND so feel resp issues are likely not CHF related. - at this time, would not pursue further cardiac workup, she is in agreement with this  2. Gen medical issues: - encouraged her to f/u with Dr Gerarda Fraction to discuss rx for anxiety and cough medicine.  3. Hyperlipidemia:  - explained that I did not feel she could get her total cholesterol and LDL down without meds - Reinforced the need to take rx consistently. - As she is worried about getting CAD, needs to control risk factors. - Encouraged her to discuss w/ Dr Gerarda Fraction if she needs to increase the Lipitor  4. SVT: - No presyncope or syncope - not having palpitations that are severe or last a long time - continue BB  5. Tobacco use - cessation encouraged  COVID-19 Education: The signs and symptoms of COVID-19 were discussed with the patient and how to seek care for testing (follow up with PCP or arrange E-visit).  The importance of social distancing was discussed today.  Patient Risk:   After full review of this patient's clinical status, I feel that they are at least moderate risk at this time.  Time:   Today, I have spent 25 minutes with the patient with telehealth technology discussing cardiac and other medical issues.     Medication Adjustments/Labs and Tests Ordered: Current medicines are reviewed at length with the patient today.  Concerns regarding medicines are outlined above.  Tests Ordered: No orders of the defined types were placed in  this encounter.  Medication Changes: No orders of the defined types were placed in this encounter.   Disposition:  Follow up with Rozann Lesches, MD   Signed, Rosaria Ferries, PA-C  04/19/2019 1:52 PM    [facility]

## 2019-04-18 NOTE — Telephone Encounter (Signed)

## 2019-04-19 ENCOUNTER — Telehealth (INDEPENDENT_AMBULATORY_CARE_PROVIDER_SITE_OTHER): Payer: Medicare Other | Admitting: Physician Assistant

## 2019-04-19 ENCOUNTER — Encounter: Payer: Self-pay | Admitting: Physician Assistant

## 2019-04-19 VITALS — Ht 61.0 in | Wt 198.0 lb

## 2019-04-19 DIAGNOSIS — E785 Hyperlipidemia, unspecified: Secondary | ICD-10-CM

## 2019-04-19 DIAGNOSIS — E1169 Type 2 diabetes mellitus with other specified complication: Secondary | ICD-10-CM | POA: Diagnosis not present

## 2019-04-19 DIAGNOSIS — R079 Chest pain, unspecified: Secondary | ICD-10-CM | POA: Diagnosis not present

## 2019-04-19 DIAGNOSIS — I471 Supraventricular tachycardia: Secondary | ICD-10-CM

## 2019-04-19 NOTE — Patient Instructions (Signed)
Medication Instructions:  Your physician recommends that you continue on your current medications as directed. Please refer to the Current Medication list given to you today.  Please discuss medications for anxiety and cough with Dr.Fusco.  Please discuss the possibility of increasing your Lipitor with Dr.Fusco.   *If you need a refill on your cardiac medications before your next appointment, please call your pharmacy*  Lab Work: NONE If you have labs (blood work) drawn today and your tests are completely normal, you will receive your results only by: [name] MyChart Message (if you have MyChart) OR . A paper copy in the mail If you have any lab test that is abnormal or we need to change your treatment, we will call you to review the results.  Testing/Procedures: NONE  Follow-Up: At [facility], you and your health needs are our priority.  As part of our continuing mission to provide you with exceptional heart care, we have created designated Provider Care Teams.  These Care Teams include your primary Cardiologist (physician) and Advanced Practice Providers (APPs -  Physician Assistants and Nurse Practitioners) who all work together to provide you with the care you need, when you need it.  Your next appointment:   6 month(s)  The format for your next appointment:   In Person  Provider:   Nona Dell, MD  Other Instructions NONE    Thank you for choosing [facility] !

## 2019-05-08 ENCOUNTER — Other Ambulatory Visit: Payer: Self-pay

## 2019-05-08 DIAGNOSIS — J449 Chronic obstructive pulmonary disease, unspecified: Secondary | ICD-10-CM | POA: Diagnosis not present

## 2019-05-08 DIAGNOSIS — S39012A Strain of muscle, fascia and tendon of lower back, initial encounter: Secondary | ICD-10-CM | POA: Diagnosis not present

## 2019-05-08 NOTE — Patient Outreach (Signed)
[facility]) Care Management  05/08/2019  Gina Bradley Aug 18, 1960 [id number]   Medication Adherence call to Mrs. Gina Bradley HIPPA Compliant Voice message left with a call back number. [name] is showing past due on Metformin 500 mg under [employer].   Lillia Abed CPhT Pharmacy Technician [facility] Care Management Direct Dial [phone]  Fax [phone] [email or web address]

## 2019-05-15 DIAGNOSIS — G894 Chronic pain syndrome: Secondary | ICD-10-CM | POA: Diagnosis not present

## 2019-05-15 DIAGNOSIS — J449 Chronic obstructive pulmonary disease, unspecified: Secondary | ICD-10-CM | POA: Diagnosis not present

## 2019-06-02 DIAGNOSIS — E1140 Type 2 diabetes mellitus with diabetic neuropathy, unspecified: Secondary | ICD-10-CM | POA: Diagnosis not present

## 2019-06-02 DIAGNOSIS — E781 Pure hyperglyceridemia: Secondary | ICD-10-CM | POA: Diagnosis not present

## 2019-06-02 DIAGNOSIS — I10 Essential (primary) hypertension: Secondary | ICD-10-CM | POA: Diagnosis not present

## 2019-06-02 DIAGNOSIS — J449 Chronic obstructive pulmonary disease, unspecified: Secondary | ICD-10-CM | POA: Diagnosis not present

## 2019-06-11 DIAGNOSIS — K219 Gastro-esophageal reflux disease without esophagitis: Secondary | ICD-10-CM | POA: Diagnosis not present

## 2019-06-11 DIAGNOSIS — I10 Essential (primary) hypertension: Secondary | ICD-10-CM | POA: Diagnosis not present

## 2019-06-11 DIAGNOSIS — J449 Chronic obstructive pulmonary disease, unspecified: Secondary | ICD-10-CM | POA: Diagnosis not present

## 2019-06-11 DIAGNOSIS — G894 Chronic pain syndrome: Secondary | ICD-10-CM | POA: Diagnosis not present

## 2019-06-24 ENCOUNTER — Emergency Department (HOSPITAL_COMMUNITY): Payer: Medicare Other

## 2019-06-24 ENCOUNTER — Emergency Department (HOSPITAL_COMMUNITY)
Admission: EM | Admit: 2019-06-24 | Discharge: 2019-06-24 | Disposition: A | Discharge status: Home / Self Care | Payer: Medicare Other | Attending: Emergency Medicine | Admitting: Emergency Medicine

## 2019-06-24 ENCOUNTER — Encounter (HOSPITAL_COMMUNITY): Payer: Self-pay

## 2019-06-24 ENCOUNTER — Other Ambulatory Visit: Payer: Self-pay

## 2019-06-24 DIAGNOSIS — I252 Old myocardial infarction: Secondary | ICD-10-CM | POA: Insufficient documentation

## 2019-06-24 DIAGNOSIS — Z7982 Long term (current) use of aspirin: Secondary | ICD-10-CM | POA: Insufficient documentation

## 2019-06-24 DIAGNOSIS — Z20822 Contact with and (suspected) exposure to covid-19: Secondary | ICD-10-CM | POA: Diagnosis not present

## 2019-06-24 DIAGNOSIS — D692 Other nonthrombocytopenic purpura: Secondary | ICD-10-CM

## 2019-06-24 DIAGNOSIS — I110 Hypertensive heart disease with heart failure: Secondary | ICD-10-CM | POA: Insufficient documentation

## 2019-06-24 DIAGNOSIS — F17210 Nicotine dependence, cigarettes, uncomplicated: Secondary | ICD-10-CM | POA: Diagnosis not present

## 2019-06-24 DIAGNOSIS — Z79899 Other long term (current) drug therapy: Secondary | ICD-10-CM | POA: Diagnosis not present

## 2019-06-24 DIAGNOSIS — L03115 Cellulitis of right lower limb: Secondary | ICD-10-CM | POA: Insufficient documentation

## 2019-06-24 DIAGNOSIS — I509 Heart failure, unspecified: Secondary | ICD-10-CM | POA: Diagnosis not present

## 2019-06-24 DIAGNOSIS — E876 Hypokalemia: Secondary | ICD-10-CM | POA: Diagnosis not present

## 2019-06-24 DIAGNOSIS — R4182 Altered mental status, unspecified: Secondary | ICD-10-CM | POA: Diagnosis not present

## 2019-06-24 DIAGNOSIS — L03119 Cellulitis of unspecified part of limb: Secondary | ICD-10-CM

## 2019-06-24 DIAGNOSIS — R29818 Other symptoms and signs involving the nervous system: Secondary | ICD-10-CM | POA: Diagnosis not present

## 2019-06-24 DIAGNOSIS — Z743 Need for continuous supervision: Secondary | ICD-10-CM | POA: Diagnosis not present

## 2019-06-24 DIAGNOSIS — E119 Type 2 diabetes mellitus without complications: Secondary | ICD-10-CM | POA: Diagnosis not present

## 2019-06-24 DIAGNOSIS — J449 Chronic obstructive pulmonary disease, unspecified: Secondary | ICD-10-CM | POA: Diagnosis not present

## 2019-06-24 DIAGNOSIS — R410 Disorientation, unspecified: Secondary | ICD-10-CM | POA: Diagnosis present

## 2019-06-24 DIAGNOSIS — L03116 Cellulitis of left lower limb: Secondary | ICD-10-CM | POA: Insufficient documentation

## 2019-06-24 DIAGNOSIS — Z7984 Long term (current) use of oral hypoglycemic drugs: Secondary | ICD-10-CM | POA: Insufficient documentation

## 2019-06-24 DIAGNOSIS — R5381 Other malaise: Secondary | ICD-10-CM | POA: Diagnosis not present

## 2019-06-24 DIAGNOSIS — R69 Illness, unspecified: Secondary | ICD-10-CM | POA: Diagnosis not present

## 2019-06-24 LAB — CBC WITH DIFFERENTIAL/PLATELET
Abs Immature Granulocytes: 0.14 10*3/uL — ABNORMAL HIGH (ref 0.00–0.07)
Basophils Absolute: 0.1 10*3/uL (ref 0.0–0.1)
Basophils Relative: 0 %
Eosinophils Absolute: 0.1 10*3/uL (ref 0.0–0.5)
Eosinophils Relative: 1 %
HCT: 41.8 % (ref 36.0–46.0)
Hemoglobin: 13.6 g/dL (ref 12.0–15.0)
Immature Granulocytes: 1 %
Lymphocytes Relative: 24 %
Lymphs Abs: 2.8 10*3/uL (ref 0.7–4.0)
MCH: 31.9 pg (ref 26.0–34.0)
MCHC: 32.5 g/dL (ref 30.0–36.0)
MCV: 97.9 fL (ref 80.0–100.0)
Monocytes Absolute: 1.2 10*3/uL — ABNORMAL HIGH (ref 0.1–1.0)
Monocytes Relative: 10 %
Neutro Abs: 7.5 10*3/uL (ref 1.7–7.7)
Neutrophils Relative %: 64 %
Platelets: 254 10*3/uL (ref 150–400)
RBC: 4.27 MIL/uL (ref 3.87–5.11)
RDW: 13.2 % (ref 11.5–15.5)
WBC: 11.8 10*3/uL — ABNORMAL HIGH (ref 4.0–10.5)
nRBC: 0 % (ref 0.0–0.2)

## 2019-06-24 LAB — COMPREHENSIVE METABOLIC PANEL
ALT: 36 U/L (ref 0–44)
AST: 26 U/L (ref 15–41)
Albumin: 3.9 g/dL (ref 3.5–5.0)
Alkaline Phosphatase: 55 U/L (ref 38–126)
Anion gap: 11 (ref 5–15)
BUN: 11 mg/dL (ref 6–20)
CO2: 23 mmol/L (ref 22–32)
Calcium: 8.9 mg/dL (ref 8.9–10.3)
Chloride: 105 mmol/L (ref 98–111)
Creatinine, Ser: 0.53 mg/dL (ref 0.44–1.00)
GFR calc Af Amer: >60 mL/min (ref >60)
GFR calc non Af Amer: >60 mL/min (ref >60)
Glucose, Bld: 106 mg/dL — ABNORMAL HIGH (ref 70–99)
Potassium: 2.9 mmol/L — ABNORMAL LOW (ref 3.5–5.1)
Sodium: 139 mmol/L (ref 135–145)
Total Bilirubin: 1 mg/dL (ref 0.3–1.2)
Total Protein: 6.5 g/dL (ref 6.5–8.1)

## 2019-06-24 LAB — PROTIME-INR
INR: 1 (ref 0.8–1.2)
Prothrombin Time: 12.7 seconds (ref 11.4–15.2)

## 2019-06-24 MED ORDER — DOXYCYCLINE HYCLATE 100 MG PO TABS
100.0000 mg | ORAL_TABLET | Freq: Once | ORAL | Status: AC
  Administered 2019-06-24: 100 mg via ORAL
  Filled 2019-06-24: qty 1

## 2019-06-24 MED ORDER — DOXYCYCLINE HYCLATE 100 MG PO CAPS: 100.0000 mg | ORAL_CAPSULE | Freq: Two times a day (BID) | ORAL | 0 refills | Status: DC

## 2019-06-24 MED ORDER — SODIUM CHLORIDE 0.9 % IV BOLUS
500.0000 mL | Freq: Once | INTRAVENOUS | Status: AC
  Administered 2019-06-24: 500 mL via INTRAVENOUS

## 2019-06-24 MED ORDER — POTASSIUM CHLORIDE CRYS ER 20 MEQ PO TBCR: 20.0000 meq | EXTENDED_RELEASE_TABLET | Freq: Two times a day (BID) | ORAL | 0 refills | Status: DC

## 2019-06-24 MED ORDER — POTASSIUM CHLORIDE CRYS ER 20 MEQ PO TBCR
40.0000 meq | EXTENDED_RELEASE_TABLET | Freq: Once | ORAL | Status: AC
  Administered 2019-06-24: 40 meq via ORAL
  Filled 2019-06-24: qty 2

## 2019-06-24 NOTE — ED Provider Notes (Addendum)
[place] Provider Note   CSN: [id number] Arrival date & time: 06/24/19  1540     History Chief Complaint  Patient presents with  . Altered Mental Status    Gina Bradley is a 59 y.o. female.  Chief complaint redness in lower extremities and questionable confusion.  Nursing notes reviewed.  No fever, sweats, chills, stiff neck.  Son states purpuric lesions on arms and legs are not new.  Past medical history includes COPD, diabetes, hypertension, CHF, several others.  Severity is moderate.  Nothing makes symptoms better or worse        Past Medical History:  Diagnosis Date  . Anemia   . Cervical disc disease   . COPD (chronic obstructive pulmonary disease) ([place])   . Diabetes mellitus without complication ([place])   . Essential hypertension   . GERD (gastroesophageal reflux disease)   . Heart failure ([place]) 2018  . History of cardiac catheterization    Normal coronaries March 2015  . History of non-ST elevation myocardial infarction (NSTEMI)    Secondary to SVT  . Lymphedema   . Peripheral neuropathy   . Spinal stenosis   . SVT (supraventricular tachycardia) [facility])     Patient Active Problem List   Diagnosis Date Noted  . PMB (postmenopausal bleeding) 10/06/2016  . Chest pressure 01/05/2015  . Gastric erosion   . Encounter for screening colonoscopy 01/22/2014  . Constipation 01/20/2014  . GERD (gastroesophageal reflux disease) 01/20/2014  . Dyspepsia 01/20/2014  . SVT (supraventricular tachycardia) ([place]) 05/31/2013  . HTN (hypertension), benign 05/31/2013  . Hypercholesteremia 05/31/2013  . COPD (chronic obstructive pulmonary disease) ([place]) 05/31/2013  . Cervical stenosis of spine 05/31/2013  . Peripheral neuropathy 05/31/2013    Past Surgical History:  Procedure Laterality Date  . CESAREAN SECTION    . ESOPHAGOGASTRODUODENOSCOPY N/A 02/10/2014   YQI:[id number] tiny antral erosion; otherwise normal EGD. No explanation for patient's symptoms.  Gallbladder needs further evaluation.  [name] LEFT HEART CATHETERIZATION WITH CORONARY ANGIOGRAM N/A 06/03/2013   Procedure: LEFT HEART CATHETERIZATION WITH CORONARY ANGIOGRAM;  Surgeon: Sinclair Grooms, MD;  Location: [facility] CATH LAB;  Service: Cardiovascular;  Laterality: N/A;     OB History    Gravida  2   Para  2   Term  2   Preterm      AB      Living  2     SAB      TAB      Ectopic      Multiple      Live Births  2           Family History  Problem Relation Age of Onset  . Heart attack Mother   . Diabetes Mother   . Hypertension Mother   . Angina Mother   . Diabetes Father   . Hypertension Father   . Heart failure Brother   . Suicidality Sister   . Colon cancer Other        Possibly Dad   . Stroke Maternal Grandmother   . Heart failure Maternal Grandmother   . Diabetes Maternal Grandfather   . Hypertension Daughter   . Hypertension Son     Social History   Tobacco Use  . Smoking status: Current Every Day Smoker    Packs/day: 0.50    Years: 18.00    Pack years: 9.00    Types: Cigarettes  . Smokeless tobacco: Never Used  Substance Use Topics  . Alcohol use: No  Alcohol/week: 0.0 standard drinks  . Drug use: Yes    Types: Marijuana    Home Medications Prior to Admission medications   Medication Sig Start Date End Date Taking? Authorizing Provider  gabapentin (NEURONTIN) 100 MG capsule Take 200 mg by mouth 4 (four) times daily.  05/25/13  Yes [provider]  predniSONE (DELTASONE) 10 MG tablet Take 10 mg by mouth daily. 06/13/19  Yes [provider]  albuterol (PROVENTIL HFA;VENTOLIN HFA) 108 (90 Base) MCG/ACT inhaler Inhale into the lungs every 6 (six) hours as needed for wheezing or shortness of breath.    [provider]  albuterol (PROVENTIL) (2.5 MG/3ML) 0.083% nebulizer solution Take 2.5 mg by nebulization 4 (four) times daily.    [provider]  ANORO ELLIPTA 62.5-25 MCG/INH AEPB Inhale 1 puff into the  lungs daily.  09/09/16   [provider]  aspirin EC 81 MG EC tablet Take 1 tablet (81 mg total) by mouth daily. 06/03/13   Lonia Blood, MD  atorvastatin (LIPITOR) 20 MG tablet Take 20 mg by mouth every evening.  03/04/13   [provider]  co-enzyme Q-10 30 MG capsule Take 100 mg by mouth daily.    [provider]  doxycycline (VIBRAMYCIN) 100 MG capsule Take 1 capsule (100 mg total) by mouth 2 (two) times daily. 06/24/19   [name], MD  EPINEPHrine (EPIPEN 2-PAK) 0.3 mg/0.3 mL IJ SOAJ injection Inject 0.3 mLs (0.3 mg total) into the muscle once as needed (for severe allergic reaction). CAll 911 immediately if you have to use this medicine 10/21/15   Eber Hong, MD  furosemide (LASIX) 20 MG tablet TAKE (1) TABLET BY MOUTH 2 TIMES DAILY AS NEEDED. Patient taking differently: Take 20 mg by mouth 2 (two) times daily.  11/20/18   Marinus Maw, MD  HYDROcodone-acetaminophen [facility]) 10-325 MG per tablet Take 1 tablet by mouth every 6 (six) hours as needed. pain 05/03/13   [provider]  ibuprofen (ADVIL,MOTRIN) 600 MG tablet Take 1 tablet (600 mg total) by mouth 4 (four) times daily. 08/23/17   Ivery Quale, PA-C  isosorbide mononitrate (ISMO) 10 MG tablet Take 10 mg by mouth daily.    [provider]  lisinopril (ZESTRIL) 2.5 MG tablet Take 2.5 mg by mouth daily.    [provider]  metFORMIN (GLUCOPHAGE) 500 MG tablet Take 500 mg by mouth as needed. 11/30/16   [provider]  metoprolol succinate (TOPROL-XL) 100 MG 24 hr tablet TAKE (1) TABLET BY MOUTH ONCE DAILY. Patient taking differently: Take 100 mg by mouth daily.  10/30/17   Jodelle Gross, NP  nitroGLYCERIN (NITROSTAT) 0.4 MG SL tablet Place 1 tablet (0.4 mg total) under the tongue every 5 (five) minutes as needed for chest pain. 12/22/14   Rolland Porter, MD  Oxycodone HCl 10 MG TABS Take 5-10 mg by mouth daily as needed (for pain).  09/06/16   [provider]   pantoprazole (PROTONIX) 40 MG tablet Take 40 mg by mouth 2 (two) times daily. 05/25/13   [provider]  potassium chloride SA (KLOR-CON) 20 MEQ tablet Take 1 tablet (20 mEq total) by mouth 2 (two) times daily. 06/24/19   [name], MD  primidone (MYSOLINE) 50 MG tablet TAKE (1) TABLET BY MOUTHTAT BEDTIME. 04/06/19   [provider]    Allergies    Bee venom and Tdap [tetanus-diphth-acell pertussis]  Review of Systems   Review of Systems  All other systems reviewed and are  negative.   Physical Exam Updated Vital Signs BP 137/89   Pulse (!) 108   Temp (!) 97.3 F (36.3 C) (Oral)   Resp 18   Ht 5\' 2"  (1.575 m)   Wt 90.7 kg   LMP 01/05/2015   SpO2 96%   BMI 36.58 kg/m   Physical Exam Vitals and nursing note reviewed.  Constitutional:      Appearance: She is well-developed.     Comments: Able to answer questions, but some confusion noted.  HENT:     Head: Normocephalic and atraumatic.  Eyes:     Conjunctiva/sclera: Conjunctivae normal.  Cardiovascular:     Rate and Rhythm: Normal rate and regular rhythm.  Pulmonary:     Effort: Pulmonary effort is normal.     Breath sounds: Normal breath sounds.  Abdominal:     General: Bowel sounds are normal.     Palpations: Abdomen is soft.  Musculoskeletal:        General: Normal range of motion.     Cervical back: Neck supple.  Skin:    Comments: Purpuric lesions noted on arms and legs (not new).  Bilateral lower extremities (distal one third) erythematous consistent with cellulitis.  Neurological:     General: No focal deficit present.     Mental Status: She is oriented to person, place, and time.  Psychiatric:        Behavior: Behavior normal.     ED Results / Procedures / Treatments   Labs (all labs ordered are listed, but only abnormal results are displayed) Labs Reviewed  CBC WITH DIFFERENTIAL/PLATELET - Abnormal; Notable for the following components:      Result Value   WBC 11.8 (*)    Monocytes  Absolute 1.2 (*)    Abs Immature Granulocytes 0.14 (*)    All other components within normal limits  COMPREHENSIVE METABOLIC PANEL - Abnormal; Notable for the following components:   Potassium 2.9 (*)    Glucose, Bld 106 (*)    All other components within normal limits  SARS CORONAVIRUS 2 (TAT 6-24 HRS)  PROTIME-INR  URINALYSIS, ROUTINE W REFLEX MICROSCOPIC    EKG None  Radiology CT Head Wo Contrast  Result Date: 06/24/2019 CLINICAL DATA:  Neuro deficit(s), subacute AMS EXAM: CT HEAD WITHOUT CONTRAST TECHNIQUE: Contiguous axial images were obtained from the base of the skull through the vertex without intravenous contrast. COMPARISON:  Head CT 08/23/2017 FINDINGS: Brain: Generalized atrophy is similar to prior exam. Minimal chronic small vessel ischemia. No evidence of acute infarction, hemorrhage, hydrocephalus, extra-axial collection or mass lesion/mass effect. Vascular: No hyperdense vessel or unexpected calcification. Skull: Slight heterogeneous density of the calvarium without focal lesion. No fracture. Sinuses/Orbits: Few scattered mucous retention cyst in the paranasal sinuses. No acute findings. The mastoid air cells are clear. Orbits are unremarkable. Other: None. IMPRESSION: 1.  No acute intracranial abnormality. 2. Mild atrophy and chronic small vessel ischemia. Electronically Signed   By: 08/25/2017 M.D.   On: 06/24/2019 17:14   DG Chest Port 1 View  Result Date: 06/24/2019 CLINICAL DATA:  Altered mental status EXAM: PORTABLE CHEST 1 VIEW COMPARISON:  11/07/2016 FINDINGS: Lungs are clear.  No pleural effusion or pneumothorax. Heart is normal in size. Prominent epicardial fat along the right heart border. Old bilateral rib fracture deformities. IMPRESSION: No evidence of acute cardiopulmonary disease. Electronically Signed   By: 01/07/2017 M.D.   On: 06/24/2019 17:26    Procedures Procedures (including critical care time)  Medications Ordered in  ED Medications   sodium chloride 0.9 % bolus 500 mL (0 mLs Intravenous Stopped 06/24/19 1921)  potassium chloride SA (KLOR-CON) CR tablet 40 mEq (40 mEq Oral Given 06/24/19 1925)  doxycycline (VIBRA-TABS) tablet 100 mg (100 mg Oral Given 06/24/19 1925)    ED Course  I have reviewed the triage vital signs and the nursing notes.  Pertinent labs & imaging results that were available during my care of the patient were reviewed by me and considered in my medical decision making (see chart for details).    MDM Rules/Calculators/A&P                      Physical exam reveals cellulitis on bilateral lower extremities.  I suspect this is the etiology of her complaint.  Purpuric lesions on arms and legs are not new per her son.  Will replace potassium and start doxycycline.  Patient has primary care follow-up.  Stable for outpatient management. Final Clinical Impression(s) / ED Diagnoses Final diagnoses:  Altered mental status, unspecified altered mental status type  Hypokalemia  Cellulitis of lower extremity, unspecified laterality  Purpura (HCC)    Rx / DC Orders ED Discharge Orders         Ordered    doxycycline (VIBRAMYCIN) 100 MG capsule  2 times daily     06/24/19 2052    potassium chloride SA (KLOR-CON) 20 MEQ tablet  2 times daily     06/24/19 2052           [name], MD 06/24/19 2056    [name], MD 06/24/19 2057

## 2019-06-24 NOTE — ED Notes (Signed)
Pt attempted to swallow pills and then spit them out. Pt said" Eww that's potassium"

## 2019-06-24 NOTE — Discharge Instructions (Addendum)
You have a skin infection in your lower legs.  Prescription for antibiotic and potassium sent to your pharmacy.  Increase fluids.  Follow-up with your primary care doctor this week.

## 2019-06-24 NOTE — ED Notes (Signed)
Son verbalized neighbor called EMS and cops due to pt was standing outside for a hour and concerned. EMS came with cops to check on pt. Son was asleep when mom was outside. Cops woke son up and stated it would be a good idea to have pt checked over. Son brought ot ER to have legs checked and stated pt has had increased confusion the last 3 days.

## 2019-06-24 NOTE — ED Triage Notes (Signed)
Pt to er with son "Clide Cliff" pt oriented to person and place, doesn't know the day of the week, pt sporadically yelling for Ricky, pt has wounds on her legs.  Pt confused, pt has flight of ideas.  Son states that pt is here to have the wounds checked on her legs and arms. Requests a general checkup.

## 2019-06-25 LAB — SARS CORONAVIRUS 2 (TAT 6-24 HRS): SARS Coronavirus 2: NEGATIVE

## 2019-06-26 ENCOUNTER — Observation Stay (HOSPITAL_COMMUNITY)
Admission: EM | Admit: 2019-06-26 | Discharge: 2019-07-04 | Disposition: A | Discharge status: Home / Self Care | Payer: Medicare Other | Attending: Internal Medicine | Admitting: Internal Medicine

## 2019-06-26 ENCOUNTER — Emergency Department (HOSPITAL_COMMUNITY): Payer: Medicare Other

## 2019-06-26 ENCOUNTER — Encounter (HOSPITAL_COMMUNITY): Payer: Self-pay

## 2019-06-26 ENCOUNTER — Other Ambulatory Visit: Payer: Self-pay

## 2019-06-26 DIAGNOSIS — E7800 Pure hypercholesterolemia, unspecified: Secondary | ICD-10-CM | POA: Diagnosis present

## 2019-06-26 DIAGNOSIS — G9340 Encephalopathy, unspecified: Principal | ICD-10-CM | POA: Insufficient documentation

## 2019-06-26 DIAGNOSIS — I110 Hypertensive heart disease with heart failure: Secondary | ICD-10-CM | POA: Insufficient documentation

## 2019-06-26 DIAGNOSIS — R4182 Altered mental status, unspecified: Secondary | ICD-10-CM

## 2019-06-26 DIAGNOSIS — F312 Bipolar disorder, current episode manic severe with psychotic features: Secondary | ICD-10-CM | POA: Insufficient documentation

## 2019-06-26 DIAGNOSIS — F209 Schizophrenia, unspecified: Secondary | ICD-10-CM | POA: Diagnosis not present

## 2019-06-26 DIAGNOSIS — I509 Heart failure, unspecified: Secondary | ICD-10-CM | POA: Insufficient documentation

## 2019-06-26 DIAGNOSIS — I10 Essential (primary) hypertension: Secondary | ICD-10-CM | POA: Diagnosis present

## 2019-06-26 DIAGNOSIS — E872 Acidosis, unspecified: Secondary | ICD-10-CM

## 2019-06-26 DIAGNOSIS — Z6833 Body mass index (BMI) 33.0-33.9, adult: Secondary | ICD-10-CM

## 2019-06-26 DIAGNOSIS — R531 Weakness: Secondary | ICD-10-CM | POA: Diagnosis not present

## 2019-06-26 DIAGNOSIS — E876 Hypokalemia: Secondary | ICD-10-CM

## 2019-06-26 DIAGNOSIS — E6609 Other obesity due to excess calories: Secondary | ICD-10-CM

## 2019-06-26 DIAGNOSIS — R404 Transient alteration of awareness: Secondary | ICD-10-CM | POA: Diagnosis not present

## 2019-06-26 DIAGNOSIS — F23 Brief psychotic disorder: Secondary | ICD-10-CM

## 2019-06-26 DIAGNOSIS — R000 Tachycardia, unspecified: Secondary | ICD-10-CM | POA: Diagnosis not present

## 2019-06-26 DIAGNOSIS — K219 Gastro-esophageal reflux disease without esophagitis: Secondary | ICD-10-CM | POA: Diagnosis present

## 2019-06-26 DIAGNOSIS — G629 Polyneuropathy, unspecified: Secondary | ICD-10-CM

## 2019-06-26 DIAGNOSIS — Z743 Need for continuous supervision: Secondary | ICD-10-CM | POA: Diagnosis not present

## 2019-06-26 DIAGNOSIS — L03119 Cellulitis of unspecified part of limb: Secondary | ICD-10-CM

## 2019-06-26 DIAGNOSIS — J449 Chronic obstructive pulmonary disease, unspecified: Secondary | ICD-10-CM | POA: Insufficient documentation

## 2019-06-26 DIAGNOSIS — M4802 Spinal stenosis, cervical region: Secondary | ICD-10-CM | POA: Diagnosis present

## 2019-06-26 DIAGNOSIS — Z20822 Contact with and (suspected) exposure to covid-19: Secondary | ICD-10-CM | POA: Diagnosis not present

## 2019-06-26 DIAGNOSIS — L0390 Cellulitis, unspecified: Secondary | ICD-10-CM

## 2019-06-26 DIAGNOSIS — E8729 Other acidosis: Secondary | ICD-10-CM

## 2019-06-26 DIAGNOSIS — R270 Ataxia, unspecified: Secondary | ICD-10-CM | POA: Diagnosis not present

## 2019-06-26 LAB — CBC WITH DIFFERENTIAL/PLATELET
Abs Immature Granulocytes: 0.2 10*3/uL — ABNORMAL HIGH (ref 0.00–0.07)
Basophils Absolute: 0.1 10*3/uL (ref 0.0–0.1)
Basophils Relative: 1 %
Eosinophils Absolute: 0.2 10*3/uL (ref 0.0–0.5)
Eosinophils Relative: 1 %
HCT: 46.4 % — ABNORMAL HIGH (ref 36.0–46.0)
Hemoglobin: 15 g/dL (ref 12.0–15.0)
Immature Granulocytes: 1 %
Lymphocytes Relative: 18 %
Lymphs Abs: 2.5 10*3/uL (ref 0.7–4.0)
MCH: 31.6 pg (ref 26.0–34.0)
MCHC: 32.3 g/dL (ref 30.0–36.0)
MCV: 97.9 fL (ref 80.0–100.0)
Monocytes Absolute: 1.4 10*3/uL — ABNORMAL HIGH (ref 0.1–1.0)
Monocytes Relative: 10 %
Neutro Abs: 9.8 10*3/uL — ABNORMAL HIGH (ref 1.7–7.7)
Neutrophils Relative %: 69 %
Platelets: 272 10*3/uL (ref 150–400)
RBC: 4.74 MIL/uL (ref 3.87–5.11)
RDW: 13.2 % (ref 11.5–15.5)
WBC: 14.3 10*3/uL — ABNORMAL HIGH (ref 4.0–10.5)
nRBC: 0 % (ref 0.0–0.2)

## 2019-06-26 LAB — COMPREHENSIVE METABOLIC PANEL
ALT: 52 U/L — ABNORMAL HIGH (ref 0–44)
AST: 39 U/L (ref 15–41)
Albumin: 3.7 g/dL (ref 3.5–5.0)
Alkaline Phosphatase: 57 U/L (ref 38–126)
Anion gap: 20 — ABNORMAL HIGH (ref 5–15)
BUN: 11 mg/dL (ref 6–20)
CO2: 16 mmol/L — ABNORMAL LOW (ref 22–32)
Calcium: 8.8 mg/dL — ABNORMAL LOW (ref 8.9–10.3)
Chloride: 100 mmol/L (ref 98–111)
Creatinine, Ser: 0.59 mg/dL (ref 0.44–1.00)
GFR calc Af Amer: >60 mL/min (ref >60)
GFR calc non Af Amer: >60 mL/min (ref >60)
Glucose, Bld: 91 mg/dL (ref 70–99)
Potassium: 2.8 mmol/L — ABNORMAL LOW (ref 3.5–5.1)
Sodium: 136 mmol/L (ref 135–145)
Total Bilirubin: 1.4 mg/dL — ABNORMAL HIGH (ref 0.3–1.2)
Total Protein: 6.8 g/dL (ref 6.5–8.1)

## 2019-06-26 LAB — BLOOD GAS, ARTERIAL
Acid-base deficit: 7.2 mmol/L — ABNORMAL HIGH (ref 0.0–2.0)
Bicarbonate: 19.6 mmol/L — ABNORMAL LOW (ref 20.0–28.0)
Drawn by: 41977
FIO2: 21
O2 Saturation: 97.5 %
Patient temperature: 37
pCO2 arterial: 24.7 mmHg — ABNORMAL LOW (ref 32.0–48.0)
pH, Arterial: 7.434 (ref 7.350–7.450)
pO2, Arterial: 102 mmHg (ref 83.0–108.0)

## 2019-06-26 LAB — AMMONIA: Ammonia: 34 umol/L (ref 9–35)

## 2019-06-26 LAB — ETHANOL: Alcohol, Ethyl (B): <10 mg/dL (ref <10)

## 2019-06-26 MED ORDER — SODIUM CHLORIDE 0.9 % IV BOLUS
1000.0000 mL | Freq: Once | INTRAVENOUS | Status: AC
  Administered 2019-06-26: 1000 mL via INTRAVENOUS

## 2019-06-26 MED ORDER — ZIPRASIDONE MESYLATE 20 MG IM SOLR
10.0000 mg | Freq: Once | INTRAMUSCULAR | Status: AC
  Administered 2019-06-26: 10 mg via INTRAMUSCULAR
  Filled 2019-06-26: qty 20

## 2019-06-26 NOTE — ED Triage Notes (Signed)
IVC by family said she hasn't been acting right for a week. Was here less than a week ago . "talking out of her head" covered in feces and skin tears

## 2019-06-26 NOTE — ED Notes (Signed)
Pt states she is at the hospital and is here for"mental health. Schizoid" said she is hearing voices and seeing things. When questioned what, pt states "god"

## 2019-06-26 NOTE — ED Notes (Signed)
XR in room 

## 2019-06-26 NOTE — ED Notes (Signed)
IVC paperwork faxed @ 2030

## 2019-06-26 NOTE — ED Provider Notes (Signed)
[facility] EMERGENCY DEPARTMENT Provider Note   CSN: [id number] Arrival date & time: 06/26/19  1921     History No chief complaint on file.   Gina Bradley is a 59 y.o. female.  Patient was brought in here for confusion.  She has been yelling and screaming saying she is hearing voices.  Patient does not have a history of schizophrenia but stated her mother with bipolar patient is oriented to person and knows she is in the hospital  The history is provided by the patient and a relative. No language interpreter was used.  Altered Mental Status Presenting symptoms: behavior changes   Severity:  Severe Most recent episode:  More than 2 days ago Episode history:  Multiple Timing:  Constant Progression:  Worsening Chronicity:  New Context: not alcohol use        Past Medical History:  Diagnosis Date  . Anemia   . Cervical disc disease   . COPD (chronic obstructive pulmonary disease) ([facility])   . Diabetes mellitus without complication ([facility])   . Essential hypertension   . GERD (gastroesophageal reflux disease)   . Heart failure ([facility]) 2018  . History of cardiac catheterization    Normal coronaries March 2015  . History of non-ST elevation myocardial infarction (NSTEMI)    Secondary to SVT  . Lymphedema   . Peripheral neuropathy   . Spinal stenosis   . SVT (supraventricular tachycardia) [facility])     Patient Active Problem List   Diagnosis Date Noted  . PMB (postmenopausal bleeding) 10/06/2016  . Chest pressure 01/05/2015  . Gastric erosion   . Encounter for screening colonoscopy 01/22/2014  . Constipation 01/20/2014  . GERD (gastroesophageal reflux disease) 01/20/2014  . Dyspepsia 01/20/2014  . SVT (supraventricular tachycardia) ([facility]) 05/31/2013  . HTN (hypertension), benign 05/31/2013  . Hypercholesteremia 05/31/2013  . COPD (chronic obstructive pulmonary disease) ([facility]) 05/31/2013  . Cervical stenosis of spine 05/31/2013  . Peripheral neuropathy 05/31/2013     Past Surgical History:  Procedure Laterality Date  . CESAREAN SECTION    . ESOPHAGOGASTRODUODENOSCOPY N/A 02/10/2014   QBH:[id number] tiny antral erosion; otherwise normal EGD. No explanation for patient's symptoms. Gallbladder needs further evaluation.  [name] LEFT HEART CATHETERIZATION WITH CORONARY ANGIOGRAM N/A 06/03/2013   Procedure: LEFT HEART CATHETERIZATION WITH CORONARY ANGIOGRAM;  Surgeon: Lesleigh Noe, MD;  Location: [facility] CATH LAB;  Service: Cardiovascular;  Laterality: N/A;     OB History    Gravida  2   Para  2   Term  2   Preterm      AB      Living  2     SAB      TAB      Ectopic      Multiple      Live Births  2           Family History  Problem Relation Age of Onset  . Heart attack Mother   . Diabetes Mother   . Hypertension Mother   . Angina Mother   . Diabetes Father   . Hypertension Father   . Heart failure Brother   . Suicidality Sister   . Colon cancer Other        Possibly Dad   . Stroke Maternal Grandmother   . Heart failure Maternal Grandmother   . Diabetes Maternal Grandfather   . Hypertension Daughter   . Hypertension Son     Social History   Tobacco Use  . Smoking status: Current  Every Day Smoker    Packs/day: 0.50    Years: 18.00    Pack years: 9.00    Types: Cigarettes  . Smokeless tobacco: Never Used  Substance Use Topics  . Alcohol use: No    Alcohol/week: 0.0 standard drinks  . Drug use: Yes    Types: Marijuana    Home Medications Prior to Admission medications   Medication Sig Start Date End Date Taking? Authorizing Provider  albuterol (PROVENTIL HFA;VENTOLIN HFA) 108 (90 Base) MCG/ACT inhaler Inhale into the lungs every 6 (six) hours as needed for wheezing or shortness of breath.    [provider]  albuterol (PROVENTIL) (2.5 MG/3ML) 0.083% nebulizer solution Take 2.5 mg by nebulization 4 (four) times daily.    [provider]  ANORO ELLIPTA 62.5-25 MCG/INH AEPB Inhale 1 puff into the  lungs daily.  09/09/16   [provider]  aspirin EC 81 MG EC tablet Take 1 tablet (81 mg total) by mouth daily. 06/03/13   Cherene Altes, MD  atorvastatin (LIPITOR) 20 MG tablet Take 20 mg by mouth every evening.  03/04/13   [provider]  co-enzyme Q-10 30 MG capsule Take 100 mg by mouth daily.    [provider]  doxycycline (VIBRAMYCIN) 100 MG capsule Take 1 capsule (100 mg total) by mouth 2 (two) times daily. 06/24/19   Nat Christen, MD  EPINEPHrine (EPIPEN 2-PAK) 0.3 mg/0.3 mL IJ SOAJ injection Inject 0.3 mLs (0.3 mg total) into the muscle once as needed (for severe allergic reaction). CAll 911 immediately if you have to use this medicine 10/21/15   Noemi Chapel, MD  furosemide (LASIX) 20 MG tablet TAKE (1) TABLET BY MOUTH 2 TIMES DAILY AS NEEDED. Patient taking differently: Take 20 mg by mouth 2 (two) times daily.  11/20/18   Evans Lance, MD  gabapentin (NEURONTIN) 100 MG capsule Take 200 mg by mouth 4 (four) times daily.  05/25/13   [provider]  HYDROcodone-acetaminophen (NORCO) 10-325 MG per tablet Take 1 tablet by mouth every 6 (six) hours as needed. pain 05/03/13   [provider]  ibuprofen (ADVIL,MOTRIN) 600 MG tablet Take 1 tablet (600 mg total) by mouth 4 (four) times daily. 08/23/17   Lily Kocher, PA-C  isosorbide mononitrate (ISMO) 10 MG tablet Take 10 mg by mouth daily.    [provider]  lisinopril (ZESTRIL) 2.5 MG tablet Take 2.5 mg by mouth daily.    [provider]  metFORMIN (GLUCOPHAGE) 500 MG tablet Take 500 mg by mouth as needed. 11/30/16   [provider]  metoprolol succinate (TOPROL-XL) 100 MG 24 hr tablet TAKE (1) TABLET BY MOUTH ONCE DAILY. Patient taking differently: Take 100 mg by mouth daily.  10/30/17   Lendon Colonel, NP  nitroGLYCERIN (NITROSTAT) 0.4 MG SL tablet Place 1 tablet (0.4 mg total) under the tongue every 5 (five) minutes as needed for chest pain. 12/22/14   Tanna Furry, MD   Oxycodone HCl 10 MG TABS Take 5-10 mg by mouth daily as needed (for pain).  09/06/16   [provider]  pantoprazole (PROTONIX) 40 MG tablet Take 40 mg by mouth 2 (two) times daily. 05/25/13   [provider]  potassium chloride SA (KLOR-CON) 20 MEQ tablet Take 1 tablet (20 mEq total) by mouth 2 (two) times daily. 06/24/19   Nat Christen, MD  predniSONE (DELTASONE) 10 MG tablet Take 10 mg by mouth daily. 06/13/19   [provider]  primidone (MYSOLINE) 50 MG  tablet TAKE (1) TABLET BY MOUTHTAT BEDTIME. 04/06/19   [provider]    Allergies    Bee venom and Tdap [tetanus-diphth-acell pertussis]  Review of Systems   Review of Systems  Unable to perform ROS: Mental status change    Physical Exam Updated Vital Signs BP 127/86   Pulse (!) 109   Temp 98.4 F (36.9 C)   Resp 19   Ht 5\' 2"  (1.575 m)   Wt 90.7 kg   LMP 01/05/2015   SpO2 100%   BMI 36.58 kg/m   Physical Exam Vitals and nursing note reviewed.  Constitutional:      Appearance: She is well-developed.  HENT:     Head: Normocephalic.     Nose: Nose normal.  Eyes:     General: No scleral icterus.    Conjunctiva/sclera: Conjunctivae normal.  Neck:     Thyroid: No thyromegaly.  Cardiovascular:     Rate and Rhythm: Normal rate and regular rhythm.     Heart sounds: No murmur. No friction rub. No gallop.   Pulmonary:     Breath sounds: No stridor. No wheezing or rales.  Chest:     Chest wall: No tenderness.  Abdominal:     General: There is no distension.     Tenderness: There is no abdominal tenderness. There is no rebound.  Musculoskeletal:        General: Normal range of motion.     Cervical back: Neck supple.  Lymphadenopathy:     Cervical: No cervical adenopathy.  Skin:    Findings: No erythema or rash.  Neurological:     Mental Status: She is alert.     Motor: No abnormal muscle tone.     Coordination: Coordination normal.     Comments: Patient alert confused oriented only  to person and that she is in the hospital  Psychiatric:     Comments: Initially patient stated she was hearing voices     ED Results / Procedures / Treatments   Labs (all labs ordered are listed, but only abnormal results are displayed) Labs Reviewed  CBC WITH DIFFERENTIAL/PLATELET - Abnormal; Notable for the following components:      Result Value   WBC 14.3 (*)    HCT 46.4 (*)    Neutro Abs 9.8 (*)    Monocytes Absolute 1.4 (*)    Abs Immature Granulocytes 0.20 (*)    All other components within normal limits  COMPREHENSIVE METABOLIC PANEL - Abnormal; Notable for the following components:   Potassium 2.8 (*)    CO2 16 (*)    Calcium 8.8 (*)    ALT 52 (*)    Total Bilirubin 1.4 (*)    Anion gap 20 (*)    All other components within normal limits  BLOOD GAS, ARTERIAL - Abnormal; Notable for the following components:   pCO2 arterial 24.7 (*)    Bicarbonate 19.6 (*)    Acid-base deficit 7.2 (*)    All other components within normal limits  AMMONIA  ETHANOL  RAPID URINE DRUG SCREEN, HOSP PERFORMED  URINALYSIS, ROUTINE W REFLEX MICROSCOPIC    EKG None  Radiology CT Head Wo Contrast  Result Date: 06/26/2019 CLINICAL DATA:  Ataxia EXAM: CT HEAD WITHOUT CONTRAST TECHNIQUE: Contiguous axial images were obtained from the base of the skull through the vertex without intravenous contrast. COMPARISON:  06/24/2019 FINDINGS: Brain: Mild diffuse cerebral atrophy. No acute intracranial abnormality. Specifically, no hemorrhage, hydrocephalus, mass lesion, acute infarction, or significant intracranial injury.  Vascular: No hyperdense vessel or unexpected calcification. Skull: No acute calvarial abnormality. Sinuses/Orbits: Visualized paranasal sinuses and mastoids clear. Orbital soft tissues unremarkable. Other: None IMPRESSION: No acute intracranial abnormality. Electronically Signed   By: Charlett Nose M.D.   On: 06/26/2019 22:04   DG Chest Port 1 View  Result Date: 06/26/2019 CLINICAL  DATA:  Weakness EXAM: PORTABLE CHEST 1 VIEW COMPARISON:  06/24/2019 FINDINGS: Heart is borderline in size. Rounded density at the right cardiophrenic angle, likely epicardial fat pad. No confluent opacities or effusions. No acute bony abnormality. IMPRESSION: No active disease. Electronically Signed   By: Charlett Nose M.D.   On: 06/26/2019 21:37    Procedures Procedures (including critical care time)  Medications Ordered in ED Medications  sodium chloride 0.9 % bolus 1,000 mL (has no administration in time range)  ziprasidone (GEODON) injection 10 mg (10 mg Intramuscular Given 06/26/19 2102)    ED Course  I have reviewed the triage vital signs and the nursing notes.  Pertinent labs & imaging results that were available during my care of the patient were reviewed by me and considered in my medical decision making (see chart for details). CRITICAL CARE Performed by: Bethann Berkshire Total critical care time: 45 minutes Critical care time was exclusive of separately billable procedures and treating other patients. Critical care was necessary to treat or prevent imminent or life-threatening deterioration. Critical care was time spent personally by me on the following activities: development of treatment plan with patient and/or surrogate as well as nursing, discussions with consultants, evaluation of patient's response to treatment, examination of patient, obtaining history from patient or surrogate, ordering and performing treatments and interventions, ordering and review of laboratory studies, ordering and review of radiographic studies, pulse oximetry and re-evaluation of patient's condition.    MDM Rules/Calculators/A&P                      Patient with altered mental status with hallucinations along with leukocytosis and hypokalemia.  Urinalysis pending.  Patient with high anion gap.  She will be admitted to medicine Final Clinical Impression(s) / ED Diagnoses Final diagnoses:  None     Rx / DC Orders ED Discharge Orders    None       Bethann Berkshire, MD 07/01/19 1001

## 2019-06-27 ENCOUNTER — Observation Stay (HOSPITAL_BASED_OUTPATIENT_CLINIC_OR_DEPARTMENT_OTHER): Payer: Medicare Other

## 2019-06-27 ENCOUNTER — Encounter (HOSPITAL_COMMUNITY): Payer: Self-pay | Admitting: Internal Medicine

## 2019-06-27 DIAGNOSIS — E872 Acidosis, unspecified: Secondary | ICD-10-CM

## 2019-06-27 DIAGNOSIS — R410 Disorientation, unspecified: Secondary | ICD-10-CM | POA: Diagnosis not present

## 2019-06-27 DIAGNOSIS — E876 Hypokalemia: Secondary | ICD-10-CM | POA: Diagnosis not present

## 2019-06-27 DIAGNOSIS — G9340 Encephalopathy, unspecified: Secondary | ICD-10-CM | POA: Diagnosis not present

## 2019-06-27 DIAGNOSIS — R000 Tachycardia, unspecified: Secondary | ICD-10-CM | POA: Diagnosis not present

## 2019-06-27 DIAGNOSIS — R4182 Altered mental status, unspecified: Secondary | ICD-10-CM

## 2019-06-27 DIAGNOSIS — I509 Heart failure, unspecified: Secondary | ICD-10-CM | POA: Diagnosis not present

## 2019-06-27 DIAGNOSIS — J449 Chronic obstructive pulmonary disease, unspecified: Secondary | ICD-10-CM | POA: Diagnosis not present

## 2019-06-27 DIAGNOSIS — I110 Hypertensive heart disease with heart failure: Secondary | ICD-10-CM | POA: Diagnosis not present

## 2019-06-27 DIAGNOSIS — Z20822 Contact with and (suspected) exposure to covid-19: Secondary | ICD-10-CM | POA: Diagnosis not present

## 2019-06-27 LAB — BASIC METABOLIC PANEL
Anion gap: 17 — ABNORMAL HIGH (ref 5–15)
BUN: 9 mg/dL (ref 6–20)
CO2: 14 mmol/L — ABNORMAL LOW (ref 22–32)
Calcium: 8.5 mg/dL — ABNORMAL LOW (ref 8.9–10.3)
Chloride: 106 mmol/L (ref 98–111)
Creatinine, Ser: 0.62 mg/dL (ref 0.44–1.00)
GFR calc Af Amer: >60 mL/min (ref >60)
GFR calc non Af Amer: >60 mL/min (ref >60)
Glucose, Bld: 83 mg/dL (ref 70–99)
Potassium: 3.3 mmol/L — ABNORMAL LOW (ref 3.5–5.1)
Sodium: 137 mmol/L (ref 135–145)

## 2019-06-27 LAB — URINALYSIS, ROUTINE W REFLEX MICROSCOPIC
Bacteria, UA: NONE SEEN
Bilirubin Urine: NEGATIVE
Glucose, UA: NEGATIVE mg/dL
Ketones, ur: 80 mg/dL — AB
Leukocytes,Ua: NEGATIVE
Nitrite: NEGATIVE
Protein, ur: >=300 mg/dL — AB
Specific Gravity, Urine: 1.013 (ref 1.005–1.030)
pH: 6 (ref 5.0–8.0)

## 2019-06-27 LAB — CBC
HCT: 40.9 % (ref 36.0–46.0)
Hemoglobin: 13.5 g/dL (ref 12.0–15.0)
MCH: 31.9 pg (ref 26.0–34.0)
MCHC: 33 g/dL (ref 30.0–36.0)
MCV: 96.7 fL (ref 80.0–100.0)
Platelets: 277 10*3/uL (ref 150–400)
RBC: 4.23 MIL/uL (ref 3.87–5.11)
RDW: 13.4 % (ref 11.5–15.5)
WBC: 14.8 10*3/uL — ABNORMAL HIGH (ref 4.0–10.5)
nRBC: 0 % (ref 0.0–0.2)

## 2019-06-27 LAB — MAGNESIUM
Magnesium: 1.7 mg/dL (ref 1.7–2.4)
Magnesium: 1.7 mg/dL (ref 1.7–2.4)

## 2019-06-27 LAB — RAPID URINE DRUG SCREEN, HOSP PERFORMED
Amphetamines: NOT DETECTED
Barbiturates: POSITIVE — AB
Benzodiazepines: NOT DETECTED
Cocaine: NOT DETECTED
Opiates: NOT DETECTED
Tetrahydrocannabinol: POSITIVE — AB

## 2019-06-27 LAB — LACTIC ACID, PLASMA: Lactic Acid, Venous: 1 mmol/L (ref 0.5–1.9)

## 2019-06-27 LAB — ACETAMINOPHEN LEVEL: Acetaminophen (Tylenol), Serum: <10 ug/mL — ABNORMAL LOW (ref 10–30)

## 2019-06-27 LAB — TSH: TSH: 1.123 u[IU]/mL (ref 0.350–4.500)

## 2019-06-27 LAB — PHENOBARBITAL LEVEL: Phenobarbital: <5 ug/mL — ABNORMAL LOW (ref 15.0–30.0)

## 2019-06-27 LAB — ECHOCARDIOGRAM COMPLETE
Height: 63 in
Weight: 2962.98 oz

## 2019-06-27 LAB — BRAIN NATRIURETIC PEPTIDE: B Natriuretic Peptide: 111 pg/mL — ABNORMAL HIGH (ref 0.0–100.0)

## 2019-06-27 LAB — SARS CORONAVIRUS 2 (TAT 6-24 HRS): SARS Coronavirus 2: NEGATIVE

## 2019-06-27 LAB — HIV ANTIBODY (ROUTINE TESTING W REFLEX): HIV Screen 4th Generation wRfx: NONREACTIVE

## 2019-06-27 LAB — SALICYLATE LEVEL: Salicylate Lvl: <7 mg/dL — ABNORMAL LOW (ref 7.0–30.0)

## 2019-06-27 MED ORDER — METOPROLOL SUCCINATE ER 50 MG PO TB24
100.0000 mg | ORAL_TABLET | Freq: Every day | ORAL | Status: DC
  Administered 2019-06-28 – 2019-07-03 (×6): 100 mg via ORAL
  Filled 2019-06-27 (×9): qty 2

## 2019-06-27 MED ORDER — DOXYCYCLINE HYCLATE 100 MG PO TABS: 100.0000 mg | ORAL_TABLET | Freq: Two times a day (BID) | ORAL | 0 refills | Status: DC

## 2019-06-27 MED ORDER — BISACODYL 10 MG RE SUPP: 10.0000 mg | Freq: Every day | RECTAL | Status: DC | PRN

## 2019-06-27 MED ORDER — FUROSEMIDE 10 MG/ML IJ SOLN
40.0000 mg | Freq: Two times a day (BID) | INTRAMUSCULAR | Status: DC
  Administered 2019-06-27: 40 mg via INTRAVENOUS
  Filled 2019-06-27: qty 4

## 2019-06-27 MED ORDER — MAGNESIUM SULFATE 2 GM/50ML IV SOLN
2.0000 g | Freq: Once | INTRAVENOUS | Status: AC
  Administered 2019-06-27: 2 g via INTRAVENOUS
  Filled 2019-06-27: qty 50

## 2019-06-27 MED ORDER — GABAPENTIN 100 MG PO CAPS
200.0000 mg | ORAL_CAPSULE | Freq: Four times a day (QID) | ORAL | Status: DC
  Filled 2019-06-27: qty 2

## 2019-06-27 MED ORDER — TRAMADOL HCL 50 MG PO TABS
50.0000 mg | ORAL_TABLET | Freq: Three times a day (TID) | ORAL | Status: DC | PRN
  Administered 2019-06-28 – 2019-07-01 (×4): 50 mg via ORAL
  Filled 2019-06-27 (×5): qty 1

## 2019-06-27 MED ORDER — ISOSORBIDE MONONITRATE 10 MG PO TABS
10.0000 mg | ORAL_TABLET | Freq: Every day | ORAL | Status: DC
  Administered 2019-06-28 – 2019-07-04 (×7): 10 mg via ORAL
  Filled 2019-06-27 (×10): qty 1

## 2019-06-27 MED ORDER — OLANZAPINE 5 MG PO TABS
5.0000 mg | ORAL_TABLET | Freq: Every day | ORAL | Status: DC
  Administered 2019-06-27 – 2019-07-03 (×7): 5 mg via ORAL
  Filled 2019-06-27 (×7): qty 1

## 2019-06-27 MED ORDER — POTASSIUM CHLORIDE CRYS ER 20 MEQ PO TBCR
40.0000 meq | EXTENDED_RELEASE_TABLET | Freq: Once | ORAL | Status: AC
  Administered 2019-06-27: 40 meq via ORAL
  Filled 2019-06-27: qty 2

## 2019-06-27 MED ORDER — POTASSIUM CHLORIDE 10 MEQ/100ML IV SOLN
10.0000 meq | INTRAVENOUS | Status: AC
  Administered 2019-06-27 (×4): 10 meq via INTRAVENOUS
  Filled 2019-06-27 (×4): qty 100

## 2019-06-27 MED ORDER — OLANZAPINE 5 MG PO TABS
2.5000 mg | ORAL_TABLET | Freq: Every day | ORAL | Status: DC
  Administered 2019-06-27 – 2019-07-04 (×8): 2.5 mg via ORAL
  Filled 2019-06-27 (×8): qty 1

## 2019-06-27 MED ORDER — COENZYME Q10 30 MG PO CAPS: 100.0000 mg | ORAL_CAPSULE | Freq: Every day | ORAL | Status: DC

## 2019-06-27 MED ORDER — ENOXAPARIN SODIUM 40 MG/0.4ML ~~LOC~~ SOLN
40.0000 mg | SUBCUTANEOUS | Status: DC
  Administered 2019-06-28 – 2019-07-04 (×7): 40 mg via SUBCUTANEOUS
  Filled 2019-06-27 (×8): qty 0.4

## 2019-06-27 MED ORDER — DOXYCYCLINE HYCLATE 100 MG PO TABS
100.0000 mg | ORAL_TABLET | Freq: Two times a day (BID) | ORAL | Status: DC
  Administered 2019-06-27 – 2019-07-04 (×14): 100 mg via ORAL
  Filled 2019-06-27 (×16): qty 1

## 2019-06-27 MED ORDER — ASPIRIN EC 81 MG PO TBEC
81.0000 mg | DELAYED_RELEASE_TABLET | Freq: Every day | ORAL | Status: DC
  Administered 2019-06-28 – 2019-07-04 (×7): 81 mg via ORAL
  Filled 2019-06-27 (×9): qty 1

## 2019-06-27 MED ORDER — POTASSIUM CHLORIDE CRYS ER 20 MEQ PO TBCR
40.0000 meq | EXTENDED_RELEASE_TABLET | Freq: Two times a day (BID) | ORAL | Status: DC
  Administered 2019-06-28: 40 meq via ORAL
  Filled 2019-06-27 (×3): qty 2

## 2019-06-27 MED ORDER — PRIMIDONE 50 MG PO TABS
50.0000 mg | ORAL_TABLET | Freq: Every day | ORAL | Status: DC
  Filled 2019-06-27: qty 1

## 2019-06-27 MED ORDER — SODIUM CHLORIDE 0.9% FLUSH
3.0000 mL | Freq: Two times a day (BID) | INTRAVENOUS | Status: DC
  Administered 2019-06-27 – 2019-07-04 (×12): 3 mL via INTRAVENOUS

## 2019-06-27 MED ORDER — POLYETHYLENE GLYCOL 3350 17 G PO PACK: 17.0000 g | PACK | Freq: Every day | ORAL | Status: DC | PRN

## 2019-06-27 MED ORDER — ALBUTEROL SULFATE (2.5 MG/3ML) 0.083% IN NEBU
2.5000 mg | INHALATION_SOLUTION | Freq: Four times a day (QID) | RESPIRATORY_TRACT | Status: DC
  Administered 2019-06-28 – 2019-06-29 (×5): 2.5 mg via RESPIRATORY_TRACT
  Filled 2019-06-27 (×5): qty 3

## 2019-06-27 MED ORDER — ALBUTEROL SULFATE (2.5 MG/3ML) 0.083% IN NEBU: 2.5000 mg | INHALATION_SOLUTION | Freq: Four times a day (QID) | RESPIRATORY_TRACT | Status: DC

## 2019-06-27 MED ORDER — NITROGLYCERIN 0.4 MG SL SUBL: 0.4000 mg | SUBLINGUAL_TABLET | SUBLINGUAL | Status: DC | PRN

## 2019-06-27 MED ORDER — ONDANSETRON HCL 4 MG/2ML IJ SOLN
4.0000 mg | Freq: Four times a day (QID) | INTRAMUSCULAR | Status: DC | PRN
  Administered 2019-07-02: 4 mg via INTRAVENOUS
  Filled 2019-06-27: qty 2

## 2019-06-27 MED ORDER — UMECLIDINIUM-VILANTEROL 62.5-25 MCG/INH IN AEPB
1.0000 | INHALATION_SPRAY | Freq: Every day | RESPIRATORY_TRACT | Status: DC
  Administered 2019-06-28 – 2019-07-04 (×7): 1 via RESPIRATORY_TRACT
  Filled 2019-06-27 (×2): qty 14

## 2019-06-27 MED ORDER — ONDANSETRON HCL 4 MG PO TABS: 4.0000 mg | ORAL_TABLET | Freq: Four times a day (QID) | ORAL | Status: DC | PRN

## 2019-06-27 MED ORDER — POTASSIUM CHLORIDE 20 MEQ PO PACK
40.0000 meq | PACK | ORAL | Status: AC
  Filled 2019-06-27: qty 2

## 2019-06-27 MED ORDER — ACETAMINOPHEN 650 MG RE SUPP: 650.0000 mg | Freq: Four times a day (QID) | RECTAL | Status: DC | PRN

## 2019-06-27 MED ORDER — POTASSIUM CHLORIDE CRYS ER 20 MEQ PO TBCR
40.0000 meq | EXTENDED_RELEASE_TABLET | Freq: Once | ORAL | Status: DC
  Filled 2019-06-27: qty 2

## 2019-06-27 MED ORDER — OLANZAPINE 10 MG IM SOLR
2.5000 mg | Freq: Once | INTRAMUSCULAR | Status: DC
  Filled 2019-06-27: qty 10

## 2019-06-27 MED ORDER — HALOPERIDOL LACTATE 5 MG/ML IJ SOLN
2.0000 mg | Freq: Four times a day (QID) | INTRAMUSCULAR | Status: DC | PRN
  Administered 2019-06-27 – 2019-06-30 (×4): 2 mg via INTRAVENOUS
  Filled 2019-06-27 (×4): qty 1

## 2019-06-27 MED ORDER — FUROSEMIDE 40 MG PO TABS: 20.0000 mg | ORAL_TABLET | Freq: Two times a day (BID) | ORAL | Status: DC

## 2019-06-27 MED ORDER — ACETAMINOPHEN 325 MG PO TABS
650.0000 mg | ORAL_TABLET | Freq: Four times a day (QID) | ORAL | Status: DC | PRN
  Administered 2019-07-01: 650 mg via ORAL
  Filled 2019-06-27: qty 2

## 2019-06-27 MED ORDER — ATORVASTATIN CALCIUM 20 MG PO TABS
20.0000 mg | ORAL_TABLET | Freq: Every evening | ORAL | Status: DC
  Administered 2019-06-28 – 2019-07-03 (×6): 20 mg via ORAL
  Filled 2019-06-27 (×6): qty 1

## 2019-06-27 MED ORDER — PANTOPRAZOLE SODIUM 40 MG PO TBEC
40.0000 mg | DELAYED_RELEASE_TABLET | Freq: Two times a day (BID) | ORAL | Status: DC
  Administered 2019-06-28 – 2019-07-04 (×13): 40 mg via ORAL
  Filled 2019-06-27 (×16): qty 1

## 2019-06-27 MED ORDER — SODIUM CHLORIDE 0.9 % IV SOLN: 250.0000 mL | INTRAVENOUS | Status: DC | PRN

## 2019-06-27 MED ORDER — GABAPENTIN 100 MG PO CAPS
100.0000 mg | ORAL_CAPSULE | Freq: Four times a day (QID) | ORAL | Status: DC
  Administered 2019-06-28 – 2019-07-03 (×23): 100 mg via ORAL
  Filled 2019-06-27 (×22): qty 1

## 2019-06-27 MED ORDER — SODIUM CHLORIDE 0.9% FLUSH: 3.0000 mL | INTRAVENOUS | Status: DC | PRN

## 2019-06-27 NOTE — BH Assessment (Signed)
Tele Assessment Note   Patient Name: Gina Bradley MRN: [id number] Referring Physician: Sherryll Burger Location of Patient: [facility] Location of Provider: Behavioral [facility] TTS Department  Gina Bradley is an 59 y.o. female presenting to [facility] ED under IVC via [facility].  Per IVC, initiated by husband, "The respondent has bipolar disorder but she refuses to go back for follow up with doctors. For the past week respondent has been walking around outside. She will not stay inside and when she does sleep she will be in a vehicle outside. The respondent stands slumped over and has tremors then she will stand up straight and walk normally for a little while. The respondent has defecated on herself and refuses to eat. She stands around and looks at the sky. The petitioner feels respondent is likely to harm herself by walking into traffic or worse if she does not receive help."  Upon this counselor's exam patient is a poor historian due to AMS. 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 I don't like being called Gina Bradley or Gina Bradley." Patient is unable to explain why she is in the hospital. Patient has flight of ideas. She appears preoccupied with a woman named "Gina Bradley." Patient is unable to provide history.  This counselor attempts to reach patient's husband, Gina Bradley at [phone], for collateral information. He did not answer and voicemail has not been set up.  Patient is alert but not oriented. She is laying in hospital bed, dressed in scrubs, and appears confused. Her speech is rapid, eye contact is poor, and has flight of ideas. Her mood is anxious and her affect is congruent. She has poor insight, judgement, and impulse control. She does not appear to be responding to internal stimuli at time of assessment. Patient potentially delusional."  Diagnosis: F31.2 Bipolar I, current episode manic, with psychosis  Past Medical History:  Past  Medical History:  Diagnosis Date  . Anemia   . Cervical disc disease   . COPD (chronic obstructive pulmonary disease) (HCC)   . Diabetes mellitus without complication (HCC)   . Essential hypertension   . GERD (gastroesophageal reflux disease)   . Heart failure (HCC) 2018  . History of cardiac catheterization    Normal coronaries March 2015  . History of non-ST elevation myocardial infarction (NSTEMI)    Secondary to SVT  . Lymphedema   . Peripheral neuropathy   . Spinal stenosis   . SVT (supraventricular tachycardia) (HCC)     Past Surgical History:  Procedure Laterality Date  . CESAREAN SECTION    . ESOPHAGOGASTRODUODENOSCOPY N/A 02/10/2014   [id number] tiny antral erosion; otherwise normal EGD. No explanation for patient's symptoms. Gallbladder needs further evaluation.  [name] LEFT HEART CATHETERIZATION WITH CORONARY ANGIOGRAM N/A 06/03/2013   Procedure: LEFT HEART CATHETERIZATION WITH CORONARY ANGIOGRAM;  Surgeon: Lesleigh Noe, MD;  [facility];  Service: Cardiovascular;  Laterality: N/A;    Family History:  Family History  Problem Relation Age of Onset  . Heart attack Mother   . Diabetes Mother   . Hypertension Mother   . Angina Mother   . Diabetes Father   . Hypertension Father   . Heart failure Brother   . Suicidality Sister   . Colon cancer Other        Possibly Dad   . Stroke Maternal Grandmother   . Heart failure Maternal Grandmother   . Diabetes  Maternal Grandfather   . Hypertension Daughter   . Hypertension Son     Social History:  reports that she has been smoking cigarettes. She has a 9.00 pack-year smoking history. She has never used smokeless tobacco. She reports current drug use. Drug: Marijuana. She reports that she does not drink alcohol.  Additional Social History:  Alcohol / Drug Use Pain Medications: see MAR Prescriptions: see MAR Over the Counter: see MAR History of alcohol / drug use?: No history of alcohol / drug abuse  CIWA:  CIWA-Ar BP: 126/90 Pulse Rate: (!) 121 COWS:    Allergies:  Allergies  Allergen Reactions  . Bee Venom Other (See Comments)    Unknown  . Tdap [Tetanus-Diphth-Acell Pertussis] Other (See Comments)    Unknown    Home Medications:  Medications Prior to Admission  Medication Sig Dispense Refill  . doxycycline (VIBRAMYCIN) 100 MG capsule Take 1 capsule (100 mg total) by mouth 2 (two) times daily. 20 capsule 0  . Oxycodone HCl 10 MG TABS Take 10 mg by mouth 4 (four) times daily as needed (for pain).   0  . predniSONE (DELTASONE) 20 MG tablet Take 20 mg by mouth 2 (two) times daily.     [name] albuterol (PROVENTIL HFA;VENTOLIN HFA) 108 (90 Base) MCG/ACT inhaler Inhale into the lungs every 6 (six) hours as needed for wheezing or shortness of breath.    [name] albuterol (PROVENTIL) (2.5 MG/3ML) 0.083% nebulizer solution Take 2.5 mg by nebulization 4 (four) times daily.    [name] ELLIPTA 62.5-25 MCG/INH AEPB Inhale 1 puff into the lungs daily.   11  . aspirin EC 81 MG EC tablet Take 1 tablet (81 mg total) by mouth daily.    [name] atorvastatin (LIPITOR) 20 MG tablet Take 20 mg by mouth every evening.     [name] co-enzyme Q-10 30 MG capsule Take 100 mg by mouth daily.    [name] EPINEPHrine (EPIPEN 2-PAK) 0.3 mg/0.3 mL IJ SOAJ injection Inject 0.3 mLs (0.3 mg total) into the muscle once as needed (for severe allergic reaction). CAll 911 immediately if you have to use this medicine 2 Device 1  . furosemide (LASIX) 20 MG tablet TAKE (1) TABLET BY MOUTH 2 TIMES DAILY AS NEEDED. (Patient taking differently: Take 20 mg by mouth 2 (two) times daily. ) 60 tablet 3  . gabapentin (NEURONTIN) 100 MG capsule Take 200 mg by mouth 4 (four) times daily.     [name] HYDROcodone-acetaminophen (NORCO) 10-325 MG per tablet Take 1 tablet by mouth every 6 (six) hours as needed. pain    . ibuprofen (ADVIL,MOTRIN) 600 MG tablet Take 1 tablet (600 mg total) by mouth 4 (four) times daily. 30 tablet 0  . isosorbide mononitrate (ISMO) 10 MG tablet  Take 10 mg by mouth daily.    [name] lisinopril (ZESTRIL) 2.5 MG tablet Take 2.5 mg by mouth daily.    . metFORMIN (GLUCOPHAGE) 500 MG tablet Take 500 mg by mouth as needed.  10  . metoprolol succinate (TOPROL-XL) 100 MG 24 hr tablet TAKE (1) TABLET BY MOUTH ONCE DAILY. (Patient taking differently: Take 100 mg by mouth daily. ) 30 tablet 9  . nitroGLYCERIN (NITROSTAT) 0.4 MG SL tablet Place 1 tablet (0.4 mg total) under the tongue every 5 (five) minutes as needed for chest pain. 10 tablet 0  . pantoprazole (PROTONIX) 40 MG tablet Take 40 mg by mouth 2 (two) times daily.    . potassium chloride SA (KLOR-CON) 20 MEQ tablet Take 1 tablet (  20 mEq total) by mouth 2 (two) times daily. 20 tablet 0  . primidone (MYSOLINE) 50 MG tablet TAKE (1) TABLET BY MOUTHTAT BEDTIME.      OB/GYN Status:  Patient's last menstrual period was 01/05/2015.  General Assessment Data Location of Assessment: [facility] TTS Assessment: In system Is this a Tele or Face-to-Face Assessment?: Tele Assessment Is this an Initial Assessment or a Re-assessment for this encounter?: Initial Assessment Patient Accompanied by:: N/A Language Other than English: No Living Arrangements: (private residence) What gender do you identify as?: Female Marital status: Married [place] name: (UTA) Pregnancy Status: No Living Arrangements: Spouse/significant other, Children Can pt return to current living arrangement?: Yes Admission Status: Involuntary Petitioner: ED Attending Is patient capable of signing voluntary admission?: No Referral Source: Self/Family/Friend Insurance type: Medicare     Crisis Care Plan Living Arrangements: Spouse/significant other, Children Legal Guardian: (self) Name of Psychiatrist: denies Name of Therapist: denies  Education Status Is patient currently in school?: No Is the patient employed, unemployed or receiving disability?: Receiving disability income  Risk to self with the past 6 months Suicidal  Ideation: No Has patient been a risk to self within the past 6 months prior to admission? : No Suicidal Intent: No Has patient had any suicidal intent within the past 6 months prior to admission? : No Is patient at risk for suicide?: No Suicidal Plan?: No Has patient had any suicidal plan within the past 6 months prior to admission? : No Access to Means: No What has been your use of drugs/alcohol within the last 12 months?: UTA Previous Attempts/Gestures: (UTA) Other Self Harm Risks: UTA Triggers for Past Attempts: Unknown Intentional Self Injurious Behavior: (UTA) Family Suicide History: Unable to assess Recent stressful life event(s): (UTA) Persecutory voices/beliefs?: Yes Depression: (UTA) Substance abuse history and/or treatment for substance abuse?: No Suicide prevention information given to non-admitted patients: Not applicable  Risk to Others within the past 6 months Homicidal Ideation: No Does patient have any lifetime risk of violence toward others beyond the six months prior to admission? : No Thoughts of Harm to Others: No Current Homicidal Intent: No Current Homicidal Plan: No Access to Homicidal Means: (UTA) History of harm to others?: (UTA) Assessment of Violence: None Noted Violent Behavior Description: none noted Does patient have access to weapons?: (UTA) Criminal Charges Pending?: No Does patient have a court date: No Is patient on probation?: No  Psychosis Hallucinations: Auditory Delusions: Unspecified  Mental Status Report Appearance/Hygiene: In scrubs, Bizarre Eye Contact: Fair Motor Activity: Freedom of movement Speech: Rapid, Tangential Level of Consciousness: Alert Mood: Anxious Affect: Anxious Anxiety Level: Moderate Thought Processes: Flight of Ideas Judgement: Impaired Orientation: Person, Place Obsessive Compulsive Thoughts/Behaviors: Unable to Assess  Cognitive Functioning Concentration: Unable to Assess Memory: Unable to Assess Is  patient IDD: No Insight: Poor Impulse Control: Unable to Assess Appetite: (UTA) Have you had any weight changes? : (UTA) Sleep: Unable to Assess Total Hours of Sleep: (UTA) Vegetative Symptoms: Unable to Assess  ADLScreening [facility] Assessment Services) Patient's cognitive ability adequate to safely complete daily activities?: No Patient able to express need for assistance with ADLs?: No Independently performs ADLs?: No  Prior Inpatient Therapy Prior Inpatient Therapy: (UTA)  Prior Outpatient Therapy Prior Outpatient Therapy: (UTA)  ADL Screening (condition at time of admission) Patient's cognitive ability adequate to safely complete daily activities?: No Is the patient deaf or have difficulty hearing?: No Does the patient have difficulty seeing, even when wearing glasses/contacts?: No Does the patient have difficulty concentrating,  remembering, or making decisions?: Yes Patient able to express need for assistance with ADLs?: No Does the patient have difficulty dressing or bathing?: Yes Independently performs ADLs?: No Communication: Needs assistance Is this a change from baseline?: Change from baseline, expected to last >3 days Dressing (OT): Needs assistance Is this a change from baseline?: Change from baseline, expected to last >3 days Grooming: Needs assistance Is this a change from baseline?: Change from baseline, expected to last >3 days Feeding: Needs assistance Is this a change from baseline?: Change from baseline, expected to last >3 days Bathing: Needs assistance Is this a change from baseline?: Change from baseline, expected to last >3 days Toileting: Needs assistance Is this a change from baseline?: Change from baseline, expected to last >3days In/Out Bed: Needs assistance Is this a change from baseline?: Change from baseline, expected to last >3 days Walks in Home: Needs assistance Is this a change from baseline?: Change from baseline, expected to last >3  days Does the patient have difficulty walking or climbing stairs?: Yes Weakness of Legs: Both Weakness of Arms/Hands: None  Home Assistive Devices/Equipment Home Assistive Devices/Equipment: None  Therapy Consults (therapy consults require a physician order) PT Evaluation Needed: No OT Evalulation Needed: No SLP Evaluation Needed: No Abuse/Neglect Assessment (Assessment to be complete while patient is alone) Abuse/Neglect Assessment Can Be Completed: Unable to assess, patient is non-responsive or altered mental status Values / Beliefs Cultural Requests During Hospitalization: None Spiritual Requests During Hospitalization: None Consults Spiritual Care Consult Needed: No Transition of Care Team Consult Needed: No Advance Directives (For Healthcare) Does Patient Have a Medical Advance Directive?: Unable to assess, patient is non-responsive or altered mental status Would patient like information on creating a medical advance directive?: No - Patient declined Nutrition Screen- [facility] Adult/[facility] Patient's home diet: Regular Has the patient recently lost weight without trying?: No Has the patient been eating poorly because of a decreased appetite?: No Malnutrition Screening Tool Score: 0        Disposition: Denzil Magnuson, NP recommends in patient once medically cleared. Disposition Initial Assessment Completed for this Encounter: Yes  This service was provided via telemedicine using a 2-way, interactive audio and video technology.  Names of all persons participating in this telemedicine service and their role in this encounter. Name: Gina Bradley Role: patient  Name: Celedonio Miyamoto, LCSW Role: TTS  Name:  Role:   Name:  Role:     Celedonio Miyamoto 06/27/2019 10:16 AM

## 2019-06-27 NOTE — BH Assessment (Signed)
Disposition: Denzil Magnuson, NP recommends in patient once medically cleared.

## 2019-06-27 NOTE — ED Provider Notes (Signed)
Care assumed from Dr. Estell Harpin, patient with new onset psychosis, urinalysis pending.  Labs are significant for potassium 2.8 bilirubin minimally elevated at 1.4, CO2 low at 16 with anion gap elevated at 20.  ABG showed normal pH in spite of a PCO2 of 24.7 indicating respiratory compensation of metabolic acidosis.  Will check salicylate level, TSH, give oral and intravenous potassium.  We will also check thyroid studies.  Salicylate is nondetectable, TSH is normal.  Urinalysis shows small amount of blood but no evidence of infection.  Case is discussed with [name] of [facility], who agrees to admit the patient.  He requests poison control consult. Poison control recommends supportive care, check lactic acid (on metformin).   Dione Booze, MD 06/27/19 [phone]

## 2019-06-27 NOTE — Progress Notes (Signed)
Patient sustaining HR 120-125 at rest. MD notified.

## 2019-06-27 NOTE — Consult Note (Signed)
Telepsych Consultation   Reason for Consult:  "Acute Psychosis with hallucinations, delusions" Referring Physician:  Hoover Brunette Location of Patient: [facility]-DEPT 300 Location of Provider: [facility]  Patient Identification: Gina Bradley MRN:  [id number] Principal Diagnosis: AMS (altered mental status) Diagnosis:  Principal Problem:   AMS (altered mental status) Active Problems:   HTN (hypertension), benign   Hypercholesteremia   COPD (chronic obstructive pulmonary disease) (HCC)   Cervical stenosis of spine   Peripheral neuropathy   GERD (gastroesophageal reflux disease)   Acidosis, metabolic   Total Time spent with patient: 30 minutes  Subjective:   Gina Bradley is a 59 y.o. female patient.  Patient assessed by nurse practitioner via telemedicine consult.  Patient alert and oriented.  Patient visualized lying down, sitter at bedside. Patient unable to articulate reason for hospital admission, patient states "I do not know."  Patient presents with disorganized speech.  Patient states "I have attention deficit, I thought it was mental but it was my babies my children, I killed the baby I am going to hell." Patient appears paranoid states "why are your eyes so big?!"  Patient presents with clang associations, states "he goes back to 1999, leaving behind, receives PCs."  Patient repeats phrases at times, states "not sorry, not sorry, not sorry, not sorry." Regarding suicidal ideations patient states "I wrapped something around my neck and some room in his hospital."  Patient denies homicidal ideations. Patient denies auditory visual hallucinations but glances around room prior to speaking and appears to this writer to be responding to internal stimuli. Patient gives verbal consent to speak with husband, Gina Bradley.  Attempted to phone husband, no answer, unable to leave message.  HPI: Patient admitted after petition for involuntary commitment by husband.  Past Psychiatric  History: Per report patient diagnosed with bipolar  Risk to Self: Suicidal Ideation: No Suicidal Intent: No Is patient at risk for suicide?: No Suicidal Plan?: No Access to Means: No What has been your use of drugs/alcohol within the last 12 months?: UTA Other Self Harm Risks: UTA Triggers for Past Attempts: Unknown Intentional Self Injurious Behavior: (UTA) Risk to Others: Homicidal Ideation: No Thoughts of Harm to Others: No Current Homicidal Intent: No Current Homicidal Plan: No Access to Homicidal Means: (UTA) History of harm to others?: (UTA) Assessment of Violence: None Noted Violent Behavior Description: none noted Does patient have access to weapons?: (UTA) Criminal Charges Pending?: No Does patient have a court date: No Prior Inpatient Therapy: Prior Inpatient Therapy: (UTA) Prior Outpatient Therapy: Prior Outpatient Therapy: (UTA)  Past Medical History:  Past Medical History:  Diagnosis Date  . Anemia   . Cervical disc disease   . COPD (chronic obstructive pulmonary disease) (HCC)   . Diabetes mellitus without complication (HCC)   . Essential hypertension   . GERD (gastroesophageal reflux disease)   . Heart failure (HCC) 2018  . History of cardiac catheterization    Normal coronaries March 2015  . History of non-ST elevation myocardial infarction (NSTEMI)    Secondary to SVT  . Lymphedema   . Peripheral neuropathy   . Spinal stenosis   . SVT (supraventricular tachycardia) (HCC)     Past Surgical History:  Procedure Laterality Date  . CESAREAN SECTION    . ESOPHAGOGASTRODUODENOSCOPY N/A 02/10/2014   [id number] tiny antral erosion; otherwise normal EGD. No explanation for patient's symptoms. Gallbladder needs further evaluation.  [name] LEFT HEART CATHETERIZATION WITH CORONARY ANGIOGRAM N/A 06/03/2013   Procedure: LEFT HEART CATHETERIZATION WITH CORONARY  Rosalin HawkingANGIOGRAM;  Surgeon: Lesleigh NoeHenry W Smith III, MD;  [facility];  Service: Cardiovascular;  Laterality: N/A;    Family History:  Family History  Problem Relation Age of Onset  . Heart attack Mother   . Diabetes Mother   . Hypertension Mother   . Angina Mother   . Diabetes Father   . Hypertension Father   . Heart failure Brother   . Suicidality Sister   . Colon cancer Other        Possibly Dad   . Stroke Maternal Grandmother   . Heart failure Maternal Grandmother   . Diabetes Maternal Grandfather   . Hypertension Daughter   . Hypertension Son    Family Psychiatric  History: Unknown Social History:  Social History   Substance and Sexual Activity  Alcohol Use No  . Alcohol/week: 0.0 standard drinks     Social History   Substance and Sexual Activity  Drug Use Yes  . Types: Marijuana    Social History   Socioeconomic History  . Marital status: Married    Spouse name: Not on file  . Number of children: Not on file  . Years of education: Not on file  . Highest education level: Not on file  Occupational History  . Occupation: [employer]    Employer: [employer]  Tobacco Use  . Smoking status: Current Every Day Smoker    Packs/day: 0.50    Years: 18.00    Pack years: 9.00    Types: Cigarettes  . Smokeless tobacco: Never Used  Substance and Sexual Activity  . Alcohol use: No    Alcohol/week: 0.0 standard drinks  . Drug use: Yes    Types: Marijuana  . Sexual activity: Not Currently    Birth control/protection: Post-menopausal  Other Topics Concern  . Not on file  Social History Narrative  . Not on file   Social Determinants of Health   Financial Resource Strain:   . Difficulty of Paying Living Expenses:   Food Insecurity:   . Worried About Programme researcher, broadcasting/film/videounning Out of Food in the Last Year:   . Baristaan Out of Food in the Last Year:   Transportation Needs:   . Freight forwarderLack of Transportation (Medical):   [name]. Lack of Transportation (Non-Medical):   Physical Activity:   . Days of Exercise per Week:   . Minutes of Exercise per Session:   Stress:   . Feeling of Stress :   Social  Connections:   . Frequency of Communication with Friends and Family:   . Frequency of Social Gatherings with Friends and Family:   . Attends Religious Services:   . Active Member of Clubs or Organizations:   . Attends BankerClub or Organization Meetings:   [name]. Marital Status:    Additional Social History:    Allergies:   Allergies  Allergen Reactions  . Bee Venom Other (See Comments)    Unknown  . Tdap [Tetanus-Diphth-Acell Pertussis] Other (See Comments)    Unknown    Labs:  Results for orders placed or performed during the hospital encounter of 06/26/19 (from the past 48 hour(s))  Rapid urine drug screen (hospital performed)     Status: Abnormal   Collection Time: 06/26/19  7:56 PM  Result Value Ref Range   Opiates NONE DETECTED NONE DETECTED   Cocaine NONE DETECTED NONE DETECTED   Benzodiazepines NONE DETECTED NONE DETECTED   Amphetamines NONE DETECTED NONE DETECTED   Tetrahydrocannabinol POSITIVE (A) NONE DETECTED   Barbiturates POSITIVE (A) NONE DETECTED  Comment: (NOTE) DRUG SCREEN FOR MEDICAL PURPOSES ONLY.  IF CONFIRMATION IS NEEDED FOR ANY PURPOSE, NOTIFY LAB WITHIN 5 DAYS. LOWEST DETECTABLE LIMITS FOR URINE DRUG SCREEN Drug Class                     Cutoff (ng/mL) Amphetamine and metabolites    1000 Barbiturate and metabolites    200 Benzodiazepine                 200 Tricyclics and metabolites     300 Opiates and metabolites        300 Cocaine and metabolites        300 THC                            50 Performed at [facility], [address], [place], [place] [address]   Urinalysis, Routine w reflex microscopic     Status: Abnormal   Collection Time: 06/26/19  7:56 PM  Result Value Ref Range   Color, Urine YELLOW YELLOW   APPearance CLEAR CLEAR   Specific Gravity, Urine 1.013 1.005 - 1.030   pH 6.0 5.0 - 8.0   Glucose, UA NEGATIVE NEGATIVE mg/dL   Hgb urine dipstick LARGE (A) NEGATIVE   Bilirubin Urine NEGATIVE NEGATIVE   Ketones, ur 80 (A) NEGATIVE  mg/dL   Protein, ur >=782 (A) NEGATIVE mg/dL   Nitrite NEGATIVE NEGATIVE   Leukocytes,Ua NEGATIVE NEGATIVE   RBC / HPF 21-50 0 - 5 RBC/hpf   WBC, UA 0-5 0 - 5 WBC/hpf   Bacteria, UA NONE SEEN NONE SEEN   Squamous Epithelial / LPF 0-5 0 - 5   Mucus PRESENT     Comment: Performed at [facility], [address] [place], [place] [address]  Blood gas, arterial     Status: Abnormal   Collection Time: 06/26/19  8:13 PM  Result Value Ref Range   FIO2 21.00    pH, Arterial 7.434 7.350 - 7.450   pCO2 arterial 24.7 (L) 32.0 - 48.0 mmHg   pO2, Arterial 102 83.0 - 108.0 mmHg   Bicarbonate 19.6 (L) 20.0 - 28.0 mmol/L   Acid-base deficit 7.2 (H) 0.0 - 2.0 mmol/L   O2 Saturation 97.5 %   Patient temperature 37.0    Collection site RIGHT RADIAL    Drawn by [id number]    Allens test (pass/fail) PASS PASS    Comment: Performed at [facility], [address], [place], [place] [address]  CBC with Differential/Platelet     Status: Abnormal   Collection Time: 06/26/19  8:58 PM  Result Value Ref Range   WBC 14.3 (H) 4.0 - 10.5 K/uL   RBC 4.74 3.87 - 5.11 MIL/uL   Hemoglobin 15.0 12.0 - 15.0 g/dL   HCT 62.9 (H) 52.8 - 41.3 %   MCV 97.9 80.0 - 100.0 fL   MCH 31.6 26.0 - 34.0 pg   MCHC 32.3 30.0 - 36.0 g/dL   RDW 24.4 01.0 - 27.2 %   Platelets 272 150 - 400 K/uL   nRBC 0.0 0.0 - 0.2 %   Neutrophils Relative % 69 %   Neutro Abs 9.8 (H) 1.7 - 7.7 K/uL   Lymphocytes Relative 18 %   Lymphs Abs 2.5 0.7 - 4.0 K/uL   Monocytes Relative 10 %   Monocytes Absolute 1.4 (H) 0.1 - 1.0 K/uL   Eosinophils Relative 1 %   Eosinophils Absolute 0.2 0.0 - 0.5  K/uL   Basophils Relative 1 %   Basophils Absolute 0.1 0.0 - 0.1 K/uL   Immature Granulocytes 1 %   Abs Immature Granulocytes 0.20 (H) 0.00 - 0.07 K/uL    Comment: Performed at [facility], [address], [place], [place] [address]  Comprehensive metabolic panel     Status: Abnormal   Collection Time: 06/26/19  8:58 PM  Result Value Ref Range   Sodium 136  135 - 145 mmol/L   Potassium 2.8 (L) 3.5 - 5.1 mmol/L   Chloride 100 98 - 111 mmol/L   CO2 16 (L) 22 - 32 mmol/L   Glucose, Bld 91 70 - 99 mg/dL    Comment: Glucose reference range applies only to samples taken after fasting for at least 8 hours.   BUN 11 6 - 20 mg/dL   Creatinine, Ser 0.56 0.44 - 1.00 mg/dL   Calcium 8.8 (L) 8.9 - 10.3 mg/dL   Total Protein 6.8 6.5 - 8.1 g/dL   Albumin 3.7 3.5 - 5.0 g/dL   AST 39 15 - 41 U/L   ALT 52 (H) 0 - 44 U/L   Alkaline Phosphatase 57 38 - 126 U/L   Total Bilirubin 1.4 (H) 0.3 - 1.2 mg/dL   GFR calc non Af Amer >60 >60 mL/min   GFR calc Af Amer >60 >60 mL/min   Anion gap 20 (H) 5 - 15    Comment: Performed at [facility], [address], [place], [place] [address]  Ammonia     Status: None   Collection Time: 06/26/19  8:58 PM  Result Value Ref Range   Ammonia 34 9 - 35 umol/L    Comment: Performed at [facility], [address] [place], [place] [address]  Ethanol     Status: None   Collection Time: 06/26/19  8:58 PM  Result Value Ref Range   Alcohol, Ethyl (B) <10 <10 mg/dL    Comment: (NOTE) Lowest detectable limit for serum alcohol is 10 mg/dL. For medical purposes only. Performed at [facility], [address] [place], [place] [address]   Salicylate level     Status: Abnormal   Collection Time: 06/26/19  8:58 PM  Result Value Ref Range   Salicylate Lvl <7.0 (L) 7.0 - 30.0 mg/dL    Comment: Performed at [facility], [address], [place], [place] [address]  Acetaminophen level     Status: Abnormal   Collection Time: 06/26/19  8:58 PM  Result Value Ref Range   Acetaminophen (Tylenol), Serum <10 (L) 10 - 30 ug/mL    Comment: (NOTE) Therapeutic concentrations vary significantly. A range of 10-30 ug/mL  may be an effective concentration for many patients. However, some  are best treated at concentrations outside of this range. Acetaminophen concentrations >150 ug/mL at 4 hours after ingestion  and >50 ug/mL at 12 hours after  ingestion are often associated with  toxic reactions. Performed at [facility], [address], [place], [place] [address]   Magnesium     Status: None   Collection Time: 06/26/19  8:58 PM  Result Value Ref Range   Magnesium 1.7 1.7 - 2.4 mg/dL    Comment: Performed at [facility], [address], [place], [place] [address]  TSH     Status: None   Collection Time: 06/26/19  8:58 PM  Result Value Ref Range   TSH 1.123 0.350 - 4.500 uIU/mL    Comment: Performed by a 3rd Generation assay with a functional sensitivity of <=  0.01 uIU/mL. Performed at [facility] [address], [place], [place] [address]   Lactic acid, plasma     Status: None   Collection Time: 06/26/19  8:58 PM  Result Value Ref Range   Lactic Acid, Venous 1.0 0.5 - 1.9 mmol/L    Comment: Performed at [facility], [address] [place], [place] [address]  Phenobarbital level     Status: Abnormal   Collection Time: 06/26/19  8:58 PM  Result Value Ref Range   Phenobarbital <5.0 (L) 15.0 - 30.0 ug/mL    Comment: Performed at [facility], [address], [place], [place] [address]  Basic metabolic panel     Status: Abnormal   Collection Time: 06/27/19  8:24 AM  Result Value Ref Range   Sodium 137 135 - 145 mmol/L   Potassium 3.3 (L) 3.5 - 5.1 mmol/L   Chloride 106 98 - 111 mmol/L   CO2 14 (L) 22 - 32 mmol/L   Glucose, Bld 83 70 - 99 mg/dL    Comment: Glucose reference range applies only to samples taken after fasting for at least 8 hours.   BUN 9 6 - 20 mg/dL   Creatinine, Ser 0.62 0.44 - 1.00 mg/dL   Calcium 8.5 (L) 8.9 - 10.3 mg/dL   GFR calc non Af Amer >60 >60 mL/min   GFR calc Af Amer >60 >60 mL/min   Anion gap 17 (H) 5 - 15    Comment: Performed at [facility], [address], [place], [place] [address]  CBC     Status: Abnormal   Collection Time: 06/27/19  8:24 AM  Result Value Ref Range   WBC 14.8 (H) 4.0 - 10.5 K/uL   RBC 4.23 3.87 - 5.11 MIL/uL   Hemoglobin 13.5 12.0 - 15.0 g/dL   HCT 40.9 36.0 -  46.0 %   MCV 96.7 80.0 - 100.0 fL   MCH 31.9 26.0 - 34.0 pg   MCHC 33.0 30.0 - 36.0 g/dL   RDW 13.4 11.5 - 15.5 %   Platelets 277 150 - 400 K/uL   nRBC 0.0 0.0 - 0.2 %    Comment: Performed at [facility], [address], [place], [place] [address]  Brain natriuretic peptide     Status: Abnormal   Collection Time: 06/27/19  8:24 AM  Result Value Ref Range   B Natriuretic Peptide 111.0 (H) 0.0 - 100.0 pg/mL    Comment: Performed at [facility], [address], [place], [place] [address]  Magnesium     Status: None   Collection Time: 06/27/19  8:24 AM  Result Value Ref Range   Magnesium 1.7 1.7 - 2.4 mg/dL    Comment: Performed at [facility], [address] [place], [place] [address]    Medications:  Current Facility-Administered Medications  Medication Dose Route Frequency Provider Last Rate Last Admin  . 0.9 %  sodium chloride infusion  250 mL Intravenous PRN Lynetta Mare, MD      . acetaminophen (TYLENOL) tablet 650 mg  650 mg Oral Q6H PRN Lynetta Mare, MD       Or  . acetaminophen (TYLENOL) suppository 650 mg  650 mg Rectal Q6H PRN Lynetta Mare, MD      . albuterol (PROVENTIL) (2.5 MG/3ML) 0.083% nebulizer solution 2.5 mg  2.5 mg Nebulization QID Manuella Ghazi, Pratik D, DO      . aspirin EC tablet 81 mg  81 mg Oral Daily Lynetta Mare, MD      .  atorvastatin (LIPITOR) tablet 20 mg  20 mg Oral QPM Olga Coaster, MD      . bisacodyl (DULCOLAX) suppository 10 mg  10 mg Rectal Daily PRN Olga Coaster, MD      . doxycycline (VIBRA-TABS) tablet 100 mg  100 mg Oral Q12H Olga Coaster, MD      . enoxaparin (LOVENOX) injection 40 mg  40 mg Subcutaneous Q24H Delia Chimes M, MD      . gabapentin (NEURONTIN) capsule 200 mg  200 mg Oral QID Olga Coaster, MD      . haloperidol lactate (HALDOL) injection 2 mg  2 mg Intravenous Q6H PRN Sherryll Burger, Pratik D, DO      . isosorbide mononitrate (ISMO) tablet 10 mg  10 mg Oral Daily Delia Chimes M, MD      . magnesium  sulfate IVPB 2 g 50 mL  2 g Intravenous Once Olga Coaster, MD 50 mL/hr at 06/27/19 1040 2 g at 06/27/19 1040  . metoprolol succinate (TOPROL-XL) 24 hr tablet 100 mg  100 mg Oral Daily Olga Coaster, MD      . nitroGLYCERIN (NITROSTAT) SL tablet 0.4 mg  0.4 mg Sublingual Q5 min PRN Olga Coaster, MD      . ondansetron [facility]) tablet 4 mg  4 mg Oral Q6H PRN Olga Coaster, MD       Or  . ondansetron (ZOFRAN) injection 4 mg  4 mg Intravenous Q6H PRN Olga Coaster, MD      . pantoprazole (PROTONIX) EC tablet 40 mg  40 mg Oral BID Olga Coaster, MD      . polyethylene glycol (MIRALAX / GLYCOLAX) packet 17 g  17 g Oral Daily PRN Olga Coaster, MD      . potassium chloride SA (KLOR-CON) CR tablet 40 mEq  40 mEq Oral Once Olga Coaster, MD      . potassium chloride SA (KLOR-CON) CR tablet 40 mEq  40 mEq Oral BID Olga Coaster, MD      . potassium chloride SA (KLOR-CON) CR tablet 40 mEq  40 mEq Oral Once Sherryll Burger, Pratik D, DO      . primidone (MYSOLINE) tablet 50 mg  50 mg Oral QHS Olga Coaster, MD      . sodium chloride flush (NS) 0.9 % injection 3 mL  3 mL Intravenous Q12H Olga Coaster, MD      . sodium chloride flush (NS) 0.9 % injection 3 mL  3 mL Intravenous PRN Olga Coaster, MD      . traMADol Janean Sark) tablet 50 mg  50 mg Oral Q8H PRN Olga Coaster, MD      . umeclidinium-vilanterol (ANORO ELLIPTA) 62.5-25 MCG/INH 1 puff  1 puff Inhalation Daily Olga Coaster, MD        Musculoskeletal: Strength & Muscle Tone: Unable to assess Gait & Station: Unable to assess Patient leans: N/A  Psychiatric Specialty Exam: Physical Exam  Nursing note and vitals reviewed. Constitutional: She is oriented to person, place, and time. She appears well-developed.  HENT:  Head: Normocephalic.  Cardiovascular: Normal rate.  Respiratory: Effort normal.  Neurological: She is alert and oriented to person, place, and time.  Psychiatric: Her mood appears  anxious. Her speech is rapid and/or pressured and tangential. She is actively hallucinating. Thought content is delusional. Cognition and memory are normal. She expresses impulsivity.    Review of Systems  Constitutional: Negative.   HENT: Negative.  Eyes: Negative.   Respiratory: Negative.   Cardiovascular: Negative.   Gastrointestinal: Negative.   Genitourinary: Negative.   Musculoskeletal: Negative.   Skin: Negative.   Neurological: Negative.   Psychiatric/Behavioral: Positive for hallucinations and suicidal ideas. The patient is nervous/anxious.     Blood pressure 126/90, pulse (!) 121, temperature 98.4 F (36.9 C), resp. rate 16, height 5\' 3"  (1.6 m), weight 84 kg, last menstrual period 01/05/2015, SpO2 100 %.Body mass index is 32.8 kg/m.  General Appearance: Casual and Fairly Groomed  Eye Contact:  Good  Speech:  Clear and Coherent and Pressured  Volume:  Increased  Mood:  Anxious  Affect:  Non-Congruent  Thought Process:  Disorganized and Descriptions of Associations: Tangential  Orientation:  Full (Time, Place, and Person)  Thought Content:  Paranoid Ideation, Tangential and Abstract Reasoning  Suicidal Thoughts:  Yes.  with intent/plan  Homicidal Thoughts:  No  Memory:  Immediate;   Good Recent;   Fair Remote;   Good  Judgement:  Impaired  Insight:  Lacking  Psychomotor Activity:  Normal  Concentration:  Concentration: Fair and Attention Span: Poor  Recall:  Fair  Fund of Knowledge:  Good  Language:  Good  Akathisia:  No  Handed:  Right  AIMS (if indicated):     Assets:  03/07/2015 Housing  ADL's:  Intact  Cognition:  Impaired,  Mild  Sleep:        Treatment Plan Summary: Case discussed with Dr. Architect. Recommend consider Zyprexa 2.5 mg by mouth daily and Zyprexa 5 mg nightly PO or IM. Recommend consider reduce Neurontin.  Recommend consider discontinue primidone for now, primidone can be mind altering  medication.  Daily contact with patient to assess and evaluate symptoms and progress in treatment    Disposition: Recommend psychiatric Inpatient admission when medically cleared. Supportive therapy provided about ongoing stressors.  This service was provided via telemedicine using a 2-way, interactive audio and video technology.  Names of all persons participating in this telemedicine service and their role in this encounter. Name: Micah Galeno Role: Patient  Name: Karenann Cai Role: FNP    Berneice Heinrich, FNP 06/27/2019 11:33 AM

## 2019-06-27 NOTE — Care Management Obs Status (Signed)
MEDICARE OBSERVATION STATUS NOTIFICATION   Patient Details  Name: MEHA VIDRINE MRN: [id number] Date of Birth: 02/06/1961   Medicare Observation Status Notification Given:  Yes    Ewing Schlein, LCSW 06/27/2019, 7:08 PM

## 2019-06-27 NOTE — TOC Progression Note (Signed)
Transition of Care [facility]) - Progression Note    Patient Details  Name: Gina Bradley MRN: [id number] Date of Birth: [birth year]  [facility] CM/SW Contact  Leitha Bleak, RN Phone Number: 06/27/2019, 11:12 AM  Clinical Narrative:   Patient admitted with AMS, under IVC, TTS completed, patient meets criteria for inpatient. Per MD patient is medically cleared. CM called Sarah at [facility] to update. They will now fax patient information out for a bed offer. TOC to follow.         Expected Discharge Plan and Services      Inpatient [facility] bed

## 2019-06-27 NOTE — H&P (Signed)
History and Physical    Patient Demographics:    Gina Bradley ZOX:[id number] DOB: May 02, 1960 DOA: 06/26/2019  PCP: Elfredia NevinsFusco, Lawrence, MD  Patient coming from: Home  I have personally briefly reviewed [facility]  Chief Complaint: Altered mental status, agitation   Assessment & Plan:     Assessment/Plan Principal Problem:   AMS (altered mental status) Active Problems:   HTN (hypertension), benign   Hypercholesteremia   COPD (chronic obstructive pulmonary disease) (HCC)   Cervical stenosis of spine   Peripheral neuropathy   GERD (gastroesophageal reflux disease)   Acidosis, metabolic     Principal Problem: Altered mental status Patient presented with increased confusion, hallucinations, psychotic behavior.  Patient was brought in yelling and screaming and saying that she is hearing voices.  At the time of my interview she refers to herself in the third person and was referring to all her symptoms as if they were affecting somebody else.  Also seem to have some delusions, confusion and hallucinations.   Does not seem to have a prior history of schizophrenia but admits to me that she has been diagnosed with "schizoid" TSH within normal meds, Tylenol level negative, phenobarbital level negative, salicylate level negative, ammonia level is normal, renal function is normal and at baseline.  Slight elevated total bilirubin 1.4.  Lactic acid is normal.  CT head is normal. Possibly acute psychosis with schizophrenia.  -One-to-one observation -Psychiatry consult in a.m. -Neurochecks -Do not believe there is any acute neurological cause for symptoms however consider additional imaging with MRI if needed  Other Active Problems: Hypokalemia: Potassium 2.8 on presentation.  Will monitor and supplement electrolytes Likely secondary to use of diuretics at home. Send magnesium level  High anion gap metabolic acidosis: Bicarb 16, anion gap 21  presentation.  Etiology is not exactly clear.  Alcohol level negative, glucose normal, lactic acid is negative, renal function is at baseline.  Salicylate level is negative.  Hypertension: -Continue metoprolol  Hyperlipidemia: -Continue Lipitor  Chronic obstructive pulmonary disease: Does not appear to be in acute exacerbation -Continue albuterol, Anoro Ellipta  Gastroesophageal reflux disease: -Continue pantoprazole  Peripheral neuropathy: -Continue gabapentin  Bilateral lower extremity cellulitis: Patient was seen in the ER on 06/24/2019 and at the time was noted to have bilateral lower extremity erythema consistent with cellulitis.  She was placed on doxycycline p.o.  Currently has no fever.  Leukocytosis is slightly worsened to 14.3 from 11.8 however patient does not appear to have significantly worsened cellulitis.   -Continue doxycycline p.o.  ??  Congestive heart failure with fluid overload Patient is on isosorbide, Lasix at home.  Unable to confirm a history of congestive heart failure but clinically appears to have some volume overload.  No previous echocardiogram on record. Noted to have increased bilateral lower extremity swelling. -We will send BNP -Repeat echocardiogram in a.m. -We will place on IV Lasix  DVT prophylaxis: Lovenox Code Status:  Full code Family Communication: N/A  Disposition Plan: Place in observation for evaluation and treatment of hyperkalemia, altered mental status Consults called: N/A Admission status: Observation stay    HPI:     HPI: Gina Dinningngela J Stencil is a 59 y.o. female with medical history significant of COPD, hypertension, hyperlipidemia, peripheral neuropathy who presented to the ER with altered mental status. Patient presented with increased confusion, hallucinations, psychotic behavior.  Patient was brought in yelling and screaming and saying that she is hearing voices.  At the time of my interview she  refers to herself in the third  person and was referring to all her symptoms as if they were affecting somebody else.  Also seem to have some delusions, confusion and hallucinations.  Possibly acute psychosis with schizophrenia.  Does not seem to have a prior history of schizophrenia but admits to me that she has been diagnosed with "schizoid".  Patient does not really give a relevant history at this time. ED Course:  Vital Signs reviewed on presentation, significant for temperature 98.4, heart rate 106, blood pressure 109/75, saturation 100% on room air. Labs reviewed, significant for arterial blood gas shows a pH of 7.43, PCO2 24, PO2 102, saturation 97%.  Sodium 136, potassium 3.8, bicarb 16, anion gap 20, BUN 11, creatinine 0.59, ALT 52, total bilirubin 1.4, ammonia 34, lactic acid 1.0, WBC count 14.3, hemoglobin 15, hematocrit 46, platelets 272, TSH 1.123. Imaging personally Reviewed, chest x-ray shows no acute cardiopulmonary disease.  CT of the head shows no acute intracranial abnormalities. EKG personally reviewed, shows sinus tachycardia, no acute ST-T changes.    Review of systems:    Review of Systems: Review of systems could not be done as the patient has altered mental status, acute psychosis    Past Medical and Surgical History:  Reviewed by me  Past Medical History:  Diagnosis Date  . Anemia   . Cervical disc disease   . COPD (chronic obstructive pulmonary disease) (HCC)   . Diabetes mellitus without complication (HCC)   . Essential hypertension   . GERD (gastroesophageal reflux disease)   . Heart failure (HCC) 2018  . History of cardiac catheterization    Normal coronaries March 2015  . History of non-ST elevation myocardial infarction (NSTEMI)    Secondary to SVT  . Lymphedema   . Peripheral neuropathy   . Spinal stenosis   . SVT (supraventricular tachycardia) (HCC)     Past Surgical History:  Procedure Laterality Date  . CESAREAN SECTION    . ESOPHAGOGASTRODUODENOSCOPY N/A 02/10/2014    OYD:XAJOIN tiny antral erosion; otherwise normal EGD. No explanation for patient's symptoms. Gallbladder needs further evaluation.  [name] LEFT HEART CATHETERIZATION WITH CORONARY ANGIOGRAM N/A 06/03/2013   Procedure: LEFT HEART CATHETERIZATION WITH CORONARY ANGIOGRAM;  Surgeon: Lesleigh Noe, MD;  Location: [facility] CATH LAB;  Service: Cardiovascular;  Laterality: N/A;     Social History:  Reviewed by me   reports that she has been smoking cigarettes. She has a 9.00 pack-year smoking history. She has never used smokeless tobacco. She reports current drug use. Drug: Marijuana. She reports that she does not drink alcohol.  Allergies:    Allergies  Allergen Reactions  . Bee Venom Other (See Comments)    Unknown  . Tdap [Tetanus-Diphth-Acell Pertussis] Other (See Comments)    Unknown    Family History :   Family History  Problem Relation Age of Onset  . Heart attack Mother   . Diabetes Mother   . Hypertension Mother   . Angina Mother   . Diabetes Father   . Hypertension Father   . Heart failure Brother   . Suicidality Sister   . Colon cancer Other        Possibly Dad   . Stroke Maternal Grandmother   . Heart failure Maternal Grandmother   . Diabetes Maternal Grandfather   . Hypertension Daughter   . Hypertension Son    Family history reviewed, noted as above, not pertinent to current presentation.   Home Medications:    Prior to Admission  medications   Medication Sig Start Date End Date Taking? Authorizing Provider  albuterol (PROVENTIL HFA;VENTOLIN HFA) 108 (90 Base) MCG/ACT inhaler Inhale into the lungs every 6 (six) hours as needed for wheezing or shortness of breath.    [provider]  albuterol (PROVENTIL) (2.5 MG/3ML) 0.083% nebulizer solution Take 2.5 mg by nebulization 4 (four) times daily.    [provider]  ANORO ELLIPTA 62.5-25 MCG/INH AEPB Inhale 1 puff into the lungs daily.  09/09/16   [provider]  aspirin EC 81 MG EC tablet Take 1  tablet (81 mg total) by mouth daily. 06/03/13   Cherene Altes, MD  atorvastatin (LIPITOR) 20 MG tablet Take 20 mg by mouth every evening.  03/04/13   [provider]  co-enzyme Q-10 30 MG capsule Take 100 mg by mouth daily.    [provider]  doxycycline (VIBRAMYCIN) 100 MG capsule Take 1 capsule (100 mg total) by mouth 2 (two) times daily. 06/24/19   Nat Christen, MD  EPINEPHrine (EPIPEN 2-PAK) 0.3 mg/0.3 mL IJ SOAJ injection Inject 0.3 mLs (0.3 mg total) into the muscle once as needed (for severe allergic reaction). CAll 911 immediately if you have to use this medicine 10/21/15   Noemi Chapel, MD  furosemide (LASIX) 20 MG tablet TAKE (1) TABLET BY MOUTH 2 TIMES DAILY AS NEEDED. Patient taking differently: Take 20 mg by mouth 2 (two) times daily.  11/20/18   Evans Lance, MD  gabapentin (NEURONTIN) 100 MG capsule Take 200 mg by mouth 4 (four) times daily.  05/25/13   [provider]  HYDROcodone-acetaminophen (NORCO) 10-325 MG per tablet Take 1 tablet by mouth every 6 (six) hours as needed. pain 05/03/13   [provider]  ibuprofen (ADVIL,MOTRIN) 600 MG tablet Take 1 tablet (600 mg total) by mouth 4 (four) times daily. 08/23/17   Lily Kocher, PA-C  isosorbide mononitrate (ISMO) 10 MG tablet Take 10 mg by mouth daily.    [provider]  lisinopril (ZESTRIL) 2.5 MG tablet Take 2.5 mg by mouth daily.    [provider]  metFORMIN (GLUCOPHAGE) 500 MG tablet Take 500 mg by mouth as needed. 11/30/16   [provider]  metoprolol succinate (TOPROL-XL) 100 MG 24 hr tablet TAKE (1) TABLET BY MOUTH ONCE DAILY. Patient taking differently: Take 100 mg by mouth daily.  10/30/17   Lendon Colonel, NP  nitroGLYCERIN (NITROSTAT) 0.4 MG SL tablet Place 1 tablet (0.4 mg total) under the tongue every 5 (five) minutes as needed for chest pain. 12/22/14   Tanna Furry, MD  Oxycodone HCl 10 MG TABS Take 5-10 mg by mouth daily as needed (for pain).   09/06/16   [provider]  pantoprazole (PROTONIX) 40 MG tablet Take 40 mg by mouth 2 (two) times daily. 05/25/13   [provider]  potassium chloride SA (KLOR-CON) 20 MEQ tablet Take 1 tablet (20 mEq total) by mouth 2 (two) times daily. 06/24/19   Nat Christen, MD  predniSONE (DELTASONE) 10 MG tablet Take 10 mg by mouth daily. 06/13/19   [provider]  primidone (MYSOLINE) 50 MG tablet TAKE (1) TABLET BY MOUTHTAT BEDTIME. 04/06/19   [provider]    Physical Exam:    Physical Exam: Vitals:   06/26/19 1926 06/26/19 1952  BP:  127/86  Pulse:  (!) 109  Resp:  19  Temp:  98.4 F (36.9 C)  SpO2:  100%  Weight: 90.7 kg   Height: 5\' 2"  (1.575  m)     Constitutional: Patient appears agitated, confused Vitals:   06/26/19 1926 06/26/19 1952  BP:  127/86  Pulse:  (!) 109  Resp:  19  Temp:  98.4 F (36.9 C)  SpO2:  100%  Weight: 90.7 kg   Height: 5\' 2"  (1.575 m)    Eyes: PERRL, lids and conjunctivae normal ENMT: Mucous membranes are moist. Posterior pharynx clear of any exudate or lesions.Normal dentition.  Neck: No abnormalities on visual inspection Respiratory: Patient did not allow exam Cardiovascular: Patient did not allow cardiovascular exam, auscultation. Abdomen: Patient did not allow exam and would not allow me to palpate, visually appears to have no abnormalities, does not appear to have distention or discomfort. Musculoskeletal: no clubbing / cyanosis. No joint deformity upper and lower extremities.  Appears to have good range of motion, no deformities.   Skin: Left lower extremity appears red just below the level of the trousers, she did not allow exam. Neurologic: Grossly does not appear to have any obvious cranial nerve abnormalities, visually appears to be able to move all 4 extremities.  Patient does not allow sensory exam or other detailed neurological exam. Psychiatric: Judgment and thought content is impaired, patient does not know  the month, thinks it is 2005, thinks 2006 is the president.  Knows she is in the hospital.   Decubitus Ulcers: Not present on admission Catheters and tubes: None  Data Review:    Labs on Admission: I have personally reviewed following labs and imaging studies  CBC: Recent Labs  Lab 06/24/19 1720 06/26/19 2058  WBC 11.8* 14.3*  NEUTROABS 7.5 9.8*  HGB 13.6 15.0  HCT 41.8 46.4*  MCV 97.9 97.9  PLT 254 272   Basic Metabolic Panel: Recent Labs  Lab 06/24/19 1720 06/26/19 2058  NA 139 136  K 2.9* 2.8*  CL 105 100  CO2 23 16*  GLUCOSE 106* 91  BUN 11 11  CREATININE 0.53 0.59  CALCIUM 8.9 8.8*  MG  --  1.7   GFR: Estimated Creatinine Clearance: 80.2 mL/min (by C-G formula based on SCr of 0.59 mg/dL). Liver Function Tests: Recent Labs  Lab 06/24/19 1720 06/26/19 2058  AST 26 39  ALT 36 52*  ALKPHOS 55 57  BILITOT 1.0 1.4*  PROT 6.5 6.8  ALBUMIN 3.9 3.7   No results for input(s): LIPASE, AMYLASE in the last 168 hours. Recent Labs  Lab 06/26/19 2058  AMMONIA 34   Coagulation Profile: Recent Labs  Lab 06/24/19 1720  INR 1.0   Cardiac Enzymes: No results for input(s): CKTOTAL, CKMB, CKMBINDEX, TROPONINI in the last 168 hours. BNP (last 3 results) No results for input(s): PROBNP in the last 8760 hours. HbA1C: No results for input(s): HGBA1C in the last 72 hours. CBG: No results for input(s): GLUCAP in the last 168 hours. Lipid Profile: No results for input(s): CHOL, HDL, LDLCALC, TRIG, CHOLHDL, LDLDIRECT in the last 72 hours. Thyroid Function Tests: Recent Labs    06/26/19 2058  TSH 1.123   Anemia Panel: No results for input(s): VITAMINB12, FOLATE, FERRITIN, TIBC, IRON, RETICCTPCT in the last 72 hours. Urine analysis:    Component Value Date/Time   COLORURINE YELLOW 06/26/2019 1956   APPEARANCEUR CLEAR 06/26/2019 1956   LABSPEC 1.013 06/26/2019 1956   PHURINE 6.0 06/26/2019 1956   GLUCOSEU NEGATIVE 06/26/2019 1956   HGBUR LARGE (A)  06/26/2019 1956   BILIRUBINUR NEGATIVE 06/26/2019 1956   KETONESUR 80 (A) 06/26/2019 1956   PROTEINUR >=300 (A) 06/26/2019 1956  UROBILINOGEN 1.0 07/29/2009 1213   NITRITE NEGATIVE 06/26/2019 1956   LEUKOCYTESUR NEGATIVE 06/26/2019 1956     Imaging Results:      Radiological Exams on Admission: CT Head Wo Contrast  Result Date: 06/26/2019 CLINICAL DATA:  Ataxia EXAM: CT HEAD WITHOUT CONTRAST TECHNIQUE: Contiguous axial images were obtained from the base of the skull through the vertex without intravenous contrast. COMPARISON:  06/24/2019 FINDINGS: Brain: Mild diffuse cerebral atrophy. No acute intracranial abnormality. Specifically, no hemorrhage, hydrocephalus, mass lesion, acute infarction, or significant intracranial injury. Vascular: No hyperdense vessel or unexpected calcification. Skull: No acute calvarial abnormality. Sinuses/Orbits: Visualized paranasal sinuses and mastoids clear. Orbital soft tissues unremarkable. Other: None IMPRESSION: No acute intracranial abnormality. Electronically Signed   By: Charlett Nose M.D.   On: 06/26/2019 22:04   DG Chest Port 1 View  Result Date: 06/26/2019 CLINICAL DATA:  Weakness EXAM: PORTABLE CHEST 1 VIEW COMPARISON:  06/24/2019 FINDINGS: Heart is borderline in size. Rounded density at the right cardiophrenic angle, likely epicardial fat pad. No confluent opacities or effusions. No acute bony abnormality. IMPRESSION: No active disease. Electronically Signed   By: Charlett Nose M.D.   On: 06/26/2019 21:37      [facility] Cyndie Chime MD [facility]  If 7PM-7AM, please contact night-coverage   06/27/2019, 3:28 AM

## 2019-06-27 NOTE — Discharge Summary (Signed)
Physician Discharge Summary  [name] XBJ:[id number] DOB: [birth year] DOA: 06/26/2019  PCP: Elfredia Nevins, MD  Admit date: 06/26/2019  Discharge date: 06/27/2019  Admitted From:Home  Disposition: Behavioral health facility  Recommendations for Outpatient Follow-up:  1. Follow up with PCP in 1-2 weeks 2. Please obtain BMP in 1 week to assess renal function and potassium levels 3. Continue on doxycycline for 5 more days to finish course of treatment for cellulitis 4. Follow up 2D echo in outpatient setting  Home Health: None  Equipment/Devices: None  Discharge Condition: Stable  CODE STATUS: Full  Diet recommendation: Heart Healthy  Brief/Interim Summary: Per HPI: Gina Bradley is a 59 y.o. female with medical history significant of COPD, hypertension, hyperlipidemia, peripheral neuropathy who presented to the ER with altered mental status. Patient presented with increased confusion, hallucinations, psychotic behavior.  Patient was brought in yelling and screaming and saying that she is hearing voices.  At the time of my interview she refers to herself in the third person and was referring to all her symptoms as if they were affecting somebody else.  Also seem to have some delusions, confusion and hallucinations.  Possibly acute psychosis with schizophrenia.  Does not seem to have a prior history of schizophrenia but admits to me that she has been diagnosed with "schizoid".  Patient does not really give a relevant history at this time.  3/25: Patient was admitted with acute psychosis in the setting of history of schizophrenia.  She was noted to be hypokalemic as well in the setting of diuretic use at home and is noted to have bilateral lower extremity cellulitis.  These factors did not appear to be contributing to her psychosis.  She has been seen by psychiatry with recommendations for inpatient behavioral health transfer for further evaluation and management.  I certainly feel  this is appropriate at this time as her potassium levels have improved to 3.3.  She will need to remain on her oral supplementation and follow-up repeat labs in the next 1 to 2 weeks.  Additionally I will prescribe her doxycycline for 5 more days to complete course of treatment for her cellulitis.  She had some questionable congestive heart failure, but I did not notice this on my exam.  She may resume her Lasix at home as prior.  2D echocardiogram performed with results still pending and can be followed up outpatient.  Discharge Diagnoses:  Principal Problem:   AMS (altered mental status) Active Problems:   HTN (hypertension), benign   Hypercholesteremia   COPD (chronic obstructive pulmonary disease) (HCC)   Cervical stenosis of spine   Peripheral neuropathy   GERD (gastroesophageal reflux disease)   Acidosis, metabolic  Principal discharge diagnosis: Acute encephalopathy secondary to acute psychosis in setting of schizophrenia.  Discharge Instructions  Discharge Instructions    Diet - low sodium heart healthy   Complete by: As directed    Increase activity slowly   Complete by: As directed      Allergies as of 06/27/2019      Reactions   Bee Venom Other (See Comments)   Unknown   Tdap [tetanus-diphth-acell Pertussis] Other (See Comments)   Unknown      Medication List    STOP taking these medications   doxycycline 100 MG capsule Commonly known as: VIBRAMYCIN Replaced by: doxycycline 100 MG tablet   HYDROcodone-acetaminophen 10-325 MG tablet Commonly known as: NORCO   methocarbamol 500 MG tablet Commonly known as: ROBAXIN   Oxycodone HCl 10 MG Tabs  TAKE these medications   albuterol 108 (90 Base) MCG/ACT inhaler Commonly known as: VENTOLIN HFA Inhale into the lungs every 6 (six) hours as needed for wheezing or shortness of breath.   albuterol (2.5 MG/3ML) 0.083% nebulizer solution Commonly known as: PROVENTIL Take 2.5 mg by nebulization 4 (four) times  daily.   Anoro Ellipta 62.5-25 MCG/INH Aepb Generic drug: umeclidinium-vilanterol Inhale 1 puff into the lungs daily.   aspirin 81 MG EC tablet Take 1 tablet (81 mg total) by mouth daily.   atorvastatin 20 MG tablet Commonly known as: LIPITOR Take 20 mg by mouth every evening.   co-enzyme Q-10 30 MG capsule Take 100 mg by mouth daily.   doxycycline 100 MG tablet Commonly known as: VIBRA-TABS Take 1 tablet (100 mg total) by mouth every 12 (twelve) hours for 5 days. Replaces: doxycycline 100 MG capsule   EPINEPHrine 0.3 mg/0.3 mL Soaj injection Commonly known as: EpiPen 2-Pak Inject 0.3 mLs (0.3 mg total) into the muscle once as needed (for severe allergic reaction). CAll 911 immediately if you have to use this medicine   fluticasone 50 MCG/ACT nasal spray Commonly known as: FLONASE Place 2 sprays into both nostrils daily.   furosemide 20 MG tablet Commonly known as: LASIX TAKE (1) TABLET BY MOUTH 2 TIMES DAILY AS NEEDED. What changed: See the new instructions.   gabapentin 100 MG capsule Commonly known as: NEURONTIN Take 200 mg by mouth 4 (four) times daily.   ibuprofen 600 MG tablet Commonly known as: ADVIL Take 1 tablet (600 mg total) by mouth 4 (four) times daily.   isosorbide mononitrate 10 MG tablet Commonly known as: ISMO Take 10 mg by mouth daily.   losartan 25 MG tablet Commonly known as: COZAAR Take 25 mg by mouth daily.   metFORMIN 500 MG tablet Commonly known as: GLUCOPHAGE Take 500 mg by mouth 2 (two) times daily.   metoprolol succinate 100 MG 24 hr tablet Commonly known as: TOPROL-XL TAKE (1) TABLET BY MOUTH ONCE DAILY. What changed: See the new instructions.   nitroGLYCERIN 0.4 MG SL tablet Commonly known as: Nitrostat Place 1 tablet (0.4 mg total) under the tongue every 5 (five) minutes as needed for chest pain.   pantoprazole 40 MG tablet Commonly known as: PROTONIX Take 40 mg by mouth 2 (two) times daily.   potassium chloride SA 20  MEQ tablet Commonly known as: KLOR-CON Take 1 tablet (20 mEq total) by mouth 2 (two) times daily.   predniSONE 5 MG tablet Commonly known as: DELTASONE Take 10 mg by mouth 2 (two) times daily.   primidone 50 MG tablet Commonly known as: MYSOLINE Take 50 mg by mouth at bedtime.      Follow-up Information    Elfredia NevinsFusco, Lawrence, MD Follow up in 1 week(s).   Specialty: Internal Medicine Contact information: [address] [place] [address] [phone]          Allergies  Allergen Reactions  . Bee Venom Other (See Comments)    Unknown  . Tdap [Tetanus-Diphth-Acell Pertussis] Other (See Comments)    Unknown    Consultations:  Psych-TTS   Procedures/Studies: CT Head Wo Contrast  Result Date: 06/26/2019 CLINICAL DATA:  Ataxia EXAM: CT HEAD WITHOUT CONTRAST TECHNIQUE: Contiguous axial images were obtained from the base of the skull through the vertex without intravenous contrast. COMPARISON:  06/24/2019 FINDINGS: Brain: Mild diffuse cerebral atrophy. No acute intracranial abnormality. Specifically, no hemorrhage, hydrocephalus, mass lesion, acute infarction, or significant intracranial injury. Vascular: No hyperdense vessel or  unexpected calcification. Skull: No acute calvarial abnormality. Sinuses/Orbits: Visualized paranasal sinuses and mastoids clear. Orbital soft tissues unremarkable. Other: None IMPRESSION: No acute intracranial abnormality. Electronically Signed   By: Charlett Nose M.D.   On: 06/26/2019 22:04   CT Head Wo Contrast  Result Date: 06/24/2019 CLINICAL DATA:  Neuro deficit(s), subacute AMS EXAM: CT HEAD WITHOUT CONTRAST TECHNIQUE: Contiguous axial images were obtained from the base of the skull through the vertex without intravenous contrast. COMPARISON:  Head CT 08/23/2017 FINDINGS: Brain: Generalized atrophy is similar to prior exam. Minimal chronic small vessel ischemia. No evidence of acute infarction, hemorrhage, hydrocephalus, extra-axial collection  or mass lesion/mass effect. Vascular: No hyperdense vessel or unexpected calcification. Skull: Slight heterogeneous density of the calvarium without focal lesion. No fracture. Sinuses/Orbits: Few scattered mucous retention cyst in the paranasal sinuses. No acute findings. The mastoid air cells are clear. Orbits are unremarkable. Other: None. IMPRESSION: 1.  No acute intracranial abnormality. 2. Mild atrophy and chronic small vessel ischemia. Electronically Signed   By: Narda Rutherford M.D.   On: 06/24/2019 17:14   DG Chest Port 1 View  Result Date: 06/26/2019 CLINICAL DATA:  Weakness EXAM: PORTABLE CHEST 1 VIEW COMPARISON:  06/24/2019 FINDINGS: Heart is borderline in size. Rounded density at the right cardiophrenic angle, likely epicardial fat pad. No confluent opacities or effusions. No acute bony abnormality. IMPRESSION: No active disease. Electronically Signed   By: Charlett Nose M.D.   On: 06/26/2019 21:37   DG Chest Port 1 View  Result Date: 06/24/2019 CLINICAL DATA:  Altered mental status EXAM: PORTABLE CHEST 1 VIEW COMPARISON:  11/07/2016 FINDINGS: Lungs are clear.  No pleural effusion or pneumothorax. Heart is normal in size. Prominent epicardial fat along the right heart border. Old bilateral rib fracture deformities. IMPRESSION: No evidence of acute cardiopulmonary disease. Electronically Signed   By: Charline Bills M.D.   On: 06/24/2019 17:26     Discharge Exam: Vitals:   06/27/19 0645 06/27/19 0922  BP:  126/90  Pulse: (!) 114 (!) 121  Resp: 17 16  Temp:    SpO2: 100% 100%   Vitals:   06/27/19 0630 06/27/19 0631 06/27/19 0645 06/27/19 0922  BP: 132/71 132/71  126/90  Pulse:  (!) 121 (!) 114 (!) 121  Resp:  (!) 22 17 16   Temp:      SpO2:  100% 100% 100%  Weight:    84 kg  Height:    5\' 3"  (1.6 m)    General: Acutely agitated and psychotic with hallucinations Cardiovascular: RRR, S1/S2 +, no rubs, no gallops Respiratory: CTA bilaterally, no wheezing, no  rhonchi Abdominal: Soft, NT, ND, bowel sounds + Extremities: Mild bilateral lower extremity edema with warmth and erythema noted over anterior shins    The results of significant diagnostics from this hospitalization (including imaging, microbiology, ancillary and laboratory) are listed below for reference.     Microbiology: Recent Results (from the past 240 hour(s))  SARS CORONAVIRUS 2 (TAT 6-24 HRS) Nasopharyngeal Nasopharyngeal Swab     Status: None   Collection Time: 06/24/19  4:47 PM   Specimen: Nasopharyngeal Swab  Result Value Ref Range Status   SARS Coronavirus 2 NEGATIVE NEGATIVE Final    Comment: (NOTE) SARS-CoV-2 target nucleic acids are NOT DETECTED. The SARS-CoV-2 RNA is generally detectable in upper and lower respiratory specimens during the acute phase of infection. Negative results do not preclude SARS-CoV-2 infection, do not rule out co-infections with other pathogens, and should not be used as the sole  basis for treatment or other patient management decisions. Negative results must be combined with clinical observations, patient history, and epidemiological information. The expected result is Negative. Fact Sheet for Patients: SugarRoll.be Fact Sheet for Healthcare Providers: https://www.woods-mathews.com/ This test is not yet approved or cleared by the [place] FDA and  has been authorized for detection and/or diagnosis of SARS-CoV-2 by FDA under an Emergency Use Authorization (EUA). This EUA will remain  in effect (meaning this test can be used) for the duration of the COVID-19 declaration under Section 56 4(b)(1) of the Act, 21 U.S.C. section 360bbb-3(b)(1), unless the authorization is terminated or revoked sooner. Performed at [facility], [place] [address], [place], [place] [address]      Labs: BNP (last 3 results) Recent Labs    06/27/19 0824  BNP 762.8*   Basic Metabolic Panel: Recent Labs   Lab 06/24/19 1720 06/26/19 2058 06/27/19 0824  NA 139 136 137  K 2.9* 2.8* 3.3*  CL 105 100 106  CO2 23 16* 14*  GLUCOSE 106* 91 83  BUN 11 11 9   CREATININE 0.53 0.59 0.62  CALCIUM 8.9 8.8* 8.5*  MG  --  1.7 1.7   Liver Function Tests: Recent Labs  Lab 06/24/19 1720 06/26/19 2058  AST 26 39  ALT 36 52*  ALKPHOS 55 57  BILITOT 1.0 1.4*  PROT 6.5 6.8  ALBUMIN 3.9 3.7   No results for input(s): LIPASE, AMYLASE in the last 168 hours. Recent Labs  Lab 06/26/19 2058  AMMONIA 34   CBC: Recent Labs  Lab 06/24/19 1720 06/26/19 2058 06/27/19 0824  WBC 11.8* 14.3* 14.8*  NEUTROABS 7.5 9.8*  --   HGB 13.6 15.0 13.5  HCT 41.8 46.4* 40.9  MCV 97.9 97.9 96.7  PLT 254 272 277   Cardiac Enzymes: No results for input(s): CKTOTAL, CKMB, CKMBINDEX, TROPONINI in the last 168 hours. BNP: Invalid input(s): POCBNP CBG: No results for input(s): GLUCAP in the last 168 hours. D-Dimer No results for input(s): DDIMER in the last 72 hours. Hgb A1c No results for input(s): HGBA1C in the last 72 hours. Lipid Profile No results for input(s): CHOL, HDL, LDLCALC, TRIG, CHOLHDL, LDLDIRECT in the last 72 hours. Thyroid function studies Recent Labs    06/26/19 2058  TSH 1.123   Anemia work up No results for input(s): VITAMINB12, FOLATE, FERRITIN, TIBC, IRON, RETICCTPCT in the last 72 hours. Urinalysis    Component Value Date/Time   COLORURINE YELLOW 06/26/2019 1956   APPEARANCEUR CLEAR 06/26/2019 1956   LABSPEC 1.013 06/26/2019 1956   PHURINE 6.0 06/26/2019 1956   GLUCOSEU NEGATIVE 06/26/2019 1956   HGBUR LARGE (A) 06/26/2019 [place] NEGATIVE 06/26/2019 1956   KETONESUR 80 (A) 06/26/2019 1956   PROTEINUR >=300 (A) 06/26/2019 1956   UROBILINOGEN 1.0 07/29/2009 1213   NITRITE NEGATIVE 06/26/2019 1956   LEUKOCYTESUR NEGATIVE 06/26/2019 1956   Sepsis Labs Invalid input(s): PROCALCITONIN,  WBC,  LACTICIDVEN Microbiology Recent Results (from the past 240 hour(s))   SARS CORONAVIRUS 2 (TAT 6-24 HRS) Nasopharyngeal Nasopharyngeal Swab     Status: None   Collection Time: 06/24/19  4:47 PM   Specimen: Nasopharyngeal Swab  Result Value Ref Range Status   SARS Coronavirus 2 NEGATIVE NEGATIVE Final    Comment: (NOTE) SARS-CoV-2 target nucleic acids are NOT DETECTED. The SARS-CoV-2 RNA is generally detectable in upper and lower respiratory specimens during the acute phase of infection. Negative results do not preclude SARS-CoV-2 infection, do not rule out co-infections  with other pathogens, and should not be used as the sole basis for treatment or other patient management decisions. Negative results must be combined with clinical observations, patient history, and epidemiological information. The expected result is Negative. Fact Sheet for Patients: HairSlick.no Fact Sheet for Healthcare Providers: quierodirigir.com This test is not yet approved or cleared by the [place] FDA and  has been authorized for detection and/or diagnosis of SARS-CoV-2 by FDA under an Emergency Use Authorization (EUA). This EUA will remain  in effect (meaning this test can be used) for the duration of the COVID-19 declaration under Section 56 4(b)(1) of the Act, 21 U.S.C. section 360bbb-3(b)(1), unless the authorization is terminated or revoked sooner. Performed at [facility], [address] [place], [place] [address]      Time coordinating discharge: 35 minutes  SIGNED:   Erick Blinks, DO [facility] 06/27/2019, 11:08 AM  If 7PM-7AM, please contact night-coverage [email or web address]

## 2019-06-27 NOTE — ED Notes (Addendum)
Attempted to call report twice on pt to 300. Nurse unavailable. No response when transferred to charge nurse. AC, Olegario Messier, states floor is ready for report. Will attempt report again in 10 minutes.

## 2019-06-27 NOTE — Progress Notes (Signed)
*  PRELIMINARY RESULTS* Echocardiogram 2D Echocardiogram has been performed.  Gina Bradley 06/27/2019, 10:04 AM

## 2019-06-27 NOTE — Progress Notes (Signed)
Pt meets inpatient criteria per Denzil Magnuson, NP. Referral information has been sent to the following hospitals for review:  [facility] Details [facility]  [facility] Details [facility] Details [facility] Details  [facility] Details [facility] Details [facility] Details  [facility] Details [facility]  Disposition will continue to follow for inpatient placement needs.   Wells Guiles, LCSW, LCAS Disposition CSW [facility] BHH/TTS [phone]

## 2019-06-28 DIAGNOSIS — G9340 Encephalopathy, unspecified: Secondary | ICD-10-CM | POA: Diagnosis not present

## 2019-06-28 LAB — BASIC METABOLIC PANEL
Anion gap: 14 (ref 5–15)
BUN: 8 mg/dL (ref 6–20)
CO2: 20 mmol/L — ABNORMAL LOW (ref 22–32)
Calcium: 8.5 mg/dL — ABNORMAL LOW (ref 8.9–10.3)
Chloride: 103 mmol/L (ref 98–111)
Creatinine, Ser: 0.59 mg/dL (ref 0.44–1.00)
GFR calc Af Amer: >60 mL/min (ref >60)
GFR calc non Af Amer: >60 mL/min (ref >60)
Glucose, Bld: 79 mg/dL (ref 70–99)
Potassium: 2.9 mmol/L — ABNORMAL LOW (ref 3.5–5.1)
Sodium: 137 mmol/L (ref 135–145)

## 2019-06-28 LAB — MAGNESIUM: Magnesium: 1.6 mg/dL — ABNORMAL LOW (ref 1.7–2.4)

## 2019-06-28 LAB — T4: T4, Total: 6.3 ug/dL (ref 4.5–12.0)

## 2019-06-28 MED ORDER — MELATONIN 3 MG PO TABS
3.0000 mg | ORAL_TABLET | Freq: Once | ORAL | Status: AC
  Administered 2019-06-29: 3 mg via ORAL
  Filled 2019-06-28: qty 1

## 2019-06-28 MED ORDER — MAGNESIUM SULFATE 2 GM/50ML IV SOLN
2.0000 g | Freq: Once | INTRAVENOUS | Status: AC
  Administered 2019-06-28: 2 g via INTRAVENOUS
  Filled 2019-06-28: qty 50

## 2019-06-28 MED ORDER — POTASSIUM CHLORIDE CRYS ER 20 MEQ PO TBCR
40.0000 meq | EXTENDED_RELEASE_TABLET | Freq: Three times a day (TID) | ORAL | Status: DC
  Administered 2019-06-28 – 2019-06-29 (×3): 40 meq via ORAL
  Filled 2019-06-28 (×3): qty 2

## 2019-06-28 NOTE — BH Assessment (Addendum)
Reassessment Note Pt presents sitting propped up in hospital bed. She states she is feeling much better. Pt states she continues to be without SI and denies HI. Pt reports she is no longer hearing voices or feeling paranoid. Provider will reassess for discharge in the morning.

## 2019-06-28 NOTE — Progress Notes (Signed)
Patient seen and evaluated this morning.  She appears stable for discharge and is conversing appropriately and answering questions.  She does not have any further outbursts of agitation or hallucinations that have been noted.  She has been started on Zyprexa per psychiatry recommendations and gabapentin dose has been reduced.  Her primidone has been discontinued as well.  She is still noted to have some hypokalemia and hypomagnesemia which will be repleted today and rechecked in a.m.  This could however still be rechecked in the outpatient setting and she is still stable for discharge.  Please refer to discharge summary dictated 3/25 for full details.  Attempt was made to call spouse on phone and there was no response.  Total care time: 30 minutes.

## 2019-06-29 DIAGNOSIS — G9340 Encephalopathy, unspecified: Secondary | ICD-10-CM | POA: Diagnosis not present

## 2019-06-29 LAB — BASIC METABOLIC PANEL
Anion gap: 13 (ref 5–15)
BUN: 8 mg/dL (ref 6–20)
CO2: 20 mmol/L — ABNORMAL LOW (ref 22–32)
Calcium: 8.9 mg/dL (ref 8.9–10.3)
Chloride: 104 mmol/L (ref 98–111)
Creatinine, Ser: 0.62 mg/dL (ref 0.44–1.00)
GFR calc Af Amer: >60 mL/min (ref >60)
GFR calc non Af Amer: >60 mL/min (ref >60)
Glucose, Bld: 98 mg/dL (ref 70–99)
Potassium: 3.8 mmol/L (ref 3.5–5.1)
Sodium: 137 mmol/L (ref 135–145)

## 2019-06-29 LAB — MAGNESIUM: Magnesium: 1.8 mg/dL (ref 1.7–2.4)

## 2019-06-29 LAB — GLUCOSE, CAPILLARY: Glucose-Capillary: 89 mg/dL (ref 70–99)

## 2019-06-29 NOTE — Progress Notes (Signed)
Late Entry:  Patient refused to let nurse tech to obtain blood pressure. MD made aware. Patient is hallucinating at times, speaking in third person at times. Patient also knows who she is, the year, and where she is. Patient takes medication with no issues noted.

## 2019-06-29 NOTE — Progress Notes (Signed)
Patient meets criteria for inpatient treatment per Hillery Jacks, NP. No appropriate beds at [facility] currently. CSW faxed referrals to the following facilities for review:  [facility]  [facility]  [facility]  [facility]  [facility]   [facility]  [facility] [place] [facility]  [facility]  [facility]   [facility]  [facility]   TTS will continue to seek bed placement.     Ruthann Cancer MSW, [facility] Clincal Social Worker Disposition  [facility] Ph: [phone] Fax: [phone] 06/29/2019 11:37 AM

## 2019-06-29 NOTE — Progress Notes (Signed)
Patient refused vitals at this time. Patient took medication with no problem. Will reassess ability to collect vitals once patient is not so anxious.

## 2019-06-29 NOTE — BH Assessment (Signed)
BHH Assessment Progress Note Patient was reassessed this date by this writer and Melvyn Neth NP. Patient continues to be disorganized and does not seem to process the content of this writer's questions at times. Based on current presentation patient continues to meet inpatient criteria as Geropsychiatry beds are investigated.

## 2019-06-29 NOTE — Progress Notes (Signed)
Patient seen and evaluated this morning.  She appears pleasant and calm.  Unfortunately, on psychiatric assessment, patient still meets criteria for inpatient placement to behavioral health facility.  She continues to have some confusion and disorganized thought process according to their evaluation.  Her magnesium and potassium levels have improved on repeat testing this morning.  She is stable for discharge once bed has been found.  Discussed case with husband on phone yesterday.  Total care time: 15 minutes.

## 2019-06-29 NOTE — Progress Notes (Signed)
Per Kat with [facility] pt has been placed on their wait list.   Braison Snoke MSW, LCSWA Clincal Social Worker Disposition  [facility] Ph: [phone] Fax: [phone]  

## 2019-06-30 DIAGNOSIS — G9340 Encephalopathy, unspecified: Secondary | ICD-10-CM | POA: Diagnosis not present

## 2019-06-30 LAB — GLUCOSE, CAPILLARY
Glucose-Capillary: 85 mg/dL (ref 70–99)
Glucose-Capillary: 205 mg/dL — ABNORMAL HIGH (ref 70–99)
Glucose-Capillary: 120 mg/dL — ABNORMAL HIGH (ref 70–99)

## 2019-06-30 MED ORDER — ALBUTEROL SULFATE (2.5 MG/3ML) 0.083% IN NEBU
2.5000 mg | INHALATION_SOLUTION | Freq: Two times a day (BID) | RESPIRATORY_TRACT | Status: DC
  Administered 2019-06-30 – 2019-07-04 (×8): 2.5 mg via RESPIRATORY_TRACT
  Filled 2019-06-30 (×9): qty 3

## 2019-06-30 NOTE — Progress Notes (Signed)
CSW faxed referral to [facility].  TTS will continue to seek inpatient placement for pt.    Jeanne Terrance MSW, LCSWA Clincal Social Worker Disposition  [facility] Ph: [phone] Fax: [phone]  

## 2019-06-30 NOTE — Progress Notes (Signed)
Patient became agitated with nurse tech sitting in room. Nurse tech checked CBG due to patient looking flushed and shaking. CBG reading was 205. Patient was complaining of headache. Gave PRN pain medication and made MD aware. Will continue to assess patient throughout shift.

## 2019-06-30 NOTE — Progress Notes (Signed)
Pt denied placement at [facility] due to not meeting their inpatient criteria.   Ruthann Cancer MSW, LCSWA Clincal Social Worker Disposition  [facility] Ph: [phone] Fax: [phone]

## 2019-06-30 NOTE — BHH Counselor (Addendum)
Re-assessment:   Patient presents reading/looking at the sales paper. When asked patient how she's feeling mentally patient stated, "a whole lot better." When asked what brought her into the Ed patient stated, "I am not really sure ma'am." Per chart presented to [facility] under IVC. The IVC stated,"The respondent has bipolar disorder but she refuses to go back for follow up with doctors. For the past week respondent has been walking around outside. She will not stay inside and when she does sleep she will be in a vehicle outside. The respondent stands slumped over and has tremors then she will stand up straight and walk normally for a little while. The respondent has defecated on herself and refuses to eat. She stands around and looks at the sky. The petitioner feels respondent is likely to harm herself by walking into traffic or worse if she does not receive help."  Patient orientated x4. Patient was pleasant, alert and spoke with regular rate of speech. Patient asked, "Am I doing better now." Patient was excited she answered the mental health status questions correct.    Disposition: Hillery Jacks, NP, continue inpatient treatment

## 2019-06-30 NOTE — Progress Notes (Signed)
Patient seen and evaluated this morning.  She appears calm and pleasant and denies any complaints or concerns.  No acute overnight events noted.  Repeat Covid testing ordered for screening when transferring to facility.  Please refer to discharge summary dictated 3/25 for full details.  Total care time: 15 minutes.

## 2019-07-01 DIAGNOSIS — G9340 Encephalopathy, unspecified: Secondary | ICD-10-CM | POA: Diagnosis not present

## 2019-07-01 LAB — GLUCOSE, CAPILLARY: Glucose-Capillary: 139 mg/dL — ABNORMAL HIGH (ref 70–99)

## 2019-07-01 MED ORDER — FLUTICASONE PROPIONATE 50 MCG/ACT NA SUSP
1.0000 | Freq: Every day | NASAL | Status: DC
  Administered 2019-07-01 – 2019-07-04 (×4): 1 via NASAL
  Filled 2019-07-01: qty 16

## 2019-07-01 MED ORDER — ALBUTEROL SULFATE (2.5 MG/3ML) 0.083% IN NEBU
2.5000 mg | INHALATION_SOLUTION | RESPIRATORY_TRACT | Status: DC | PRN
  Administered 2019-07-01: 2.5 mg via RESPIRATORY_TRACT

## 2019-07-01 MED ORDER — ALBUTEROL SULFATE (2.5 MG/3ML) 0.083% IN NEBU
INHALATION_SOLUTION | RESPIRATORY_TRACT | Status: AC
  Filled 2019-07-01: qty 3

## 2019-07-01 NOTE — Progress Notes (Signed)
Patient seen and evaluated this morning and appears to be in good spirits.  Please refer to discharge summary dictated on 3/25 for full details.  Total care time: 10 minutes.

## 2019-07-02 DIAGNOSIS — G9340 Encephalopathy, unspecified: Secondary | ICD-10-CM | POA: Diagnosis not present

## 2019-07-02 MED ORDER — HYDROCODONE-ACETAMINOPHEN 10-325 MG PO TABS
1.0000 | ORAL_TABLET | Freq: Four times a day (QID) | ORAL | Status: DC | PRN
  Administered 2019-07-02 – 2019-07-04 (×6): 1 via ORAL
  Filled 2019-07-02 (×6): qty 1

## 2019-07-02 MED ORDER — CAMPHOR-MENTHOL 0.5-0.5 % EX LOTN
TOPICAL_LOTION | CUTANEOUS | Status: DC | PRN
  Filled 2019-07-02: qty 222

## 2019-07-02 NOTE — BH Assessment (Signed)
Several attempts and calls to request tele assessment machine to be placed in patient's room. TTS will continue to try reaching out to patient's nurse.

## 2019-07-02 NOTE — Progress Notes (Signed)
Spoke with BH in regard to TTS machine. Machine has been in patient's room for quite some time, but has not been able to connect. Will call IT to see if service request has been placed.

## 2019-07-02 NOTE — BH Assessment (Signed)
Continuing to reach patient. Called machine and it doesn't seem to be connecting.

## 2019-07-02 NOTE — Progress Notes (Signed)
Patient seen and evaluated this morning and again appears to be in good spirits.  She appears to think that the rashes on her legs are clearing with the doxycycline.  She is currently day 6 on doxycycline and appears to at least require 10 to 14 days of treatment with the extensiveness of her cellulitis on not only her legs, but also her upper extremities.  Continue ongoing treatment for now.  She is and has been medically stable for discharge since 3/25 and discharge summary dictated at that time.  Please refer to the summary for full details.  Ongoing psychiatric evaluation noted with criteria still met for inpatient behavioral health facility.  Pending placement.  Care time: 15 minutes. 

## 2019-07-02 NOTE — Progress Notes (Signed)
Pt has been refaxed to the following hospitals for review:  [facility] Details [facility] Details  [facility] Details [facility] Details  [facility] Details [facility] Details [facility] Details  [facility] Details [facility] Details  [facility] Details [facility] Details [facility] Details  [facility] Details [facility]  Disposition will continue to follow for inpatient placement needs.   Wells Guiles, LCSW, LCAS Disposition CSW [facility]/TTS [phone]

## 2019-07-02 NOTE — BH Assessment (Addendum)
Attempted to complete TTS assessment. However, the tele assessment machine will not pick up. Informed [facility] staff and they are unable to trouble shoot the issue. Writer suggested calling IT or someone to assist with the machine.

## 2019-07-02 NOTE — BH Assessment (Addendum)
Reassessment 07/02/2019:   Patient presents well today. She seems to be a pleasant mood. Patient denies SI, HI, and AVH's. She doesn't appear to be responding to internal stimuli. When asked about what led to her hospital admission. She states, "I really don't remember". Patient vaguely recalls living in a car for 3 days and states that she was confused. Today patient is oriented to time, person, place, and situation. She knew the exact stating it was her dads birthday. She also knew where she was and that she was admitted medically. She reported feeling much better. She is reportedly sleeping well and appetite she states is good. She has asked if their was a history of mental health illness and she stated, "No, I've never had a mental illness". She indicates that her son suffers from mental illness and she has taken him to [facility] for treatment. States that the only thing she knows about mental illness is her sons mental health problems Patient was dressed in a gown. Her affect was good. Her mood is appropriate.    Contacted patients spouse to inquire about patient's baseline. Her spouse did not answer the phone. His voicemail is not set up and their was no option to leave a voicemail.  Per Garlan Fillers, NP, patient continues to meet inpatient criteria.

## 2019-07-02 NOTE — Progress Notes (Signed)
Pt husband called, verified patient's name and DOB. He was updated on her status. Plans to come visit patient today. She has no current suicidal ideation.

## 2019-07-03 DIAGNOSIS — I10 Essential (primary) hypertension: Secondary | ICD-10-CM | POA: Diagnosis not present

## 2019-07-03 DIAGNOSIS — K219 Gastro-esophageal reflux disease without esophagitis: Secondary | ICD-10-CM

## 2019-07-03 DIAGNOSIS — R4182 Altered mental status, unspecified: Secondary | ICD-10-CM | POA: Diagnosis not present

## 2019-07-03 DIAGNOSIS — G9340 Encephalopathy, unspecified: Secondary | ICD-10-CM | POA: Diagnosis not present

## 2019-07-03 DIAGNOSIS — G609 Hereditary and idiopathic neuropathy, unspecified: Secondary | ICD-10-CM

## 2019-07-03 DIAGNOSIS — E7800 Pure hypercholesterolemia, unspecified: Secondary | ICD-10-CM

## 2019-07-03 DIAGNOSIS — F23 Brief psychotic disorder: Secondary | ICD-10-CM | POA: Diagnosis not present

## 2019-07-03 DIAGNOSIS — J410 Simple chronic bronchitis: Secondary | ICD-10-CM

## 2019-07-03 DIAGNOSIS — E876 Hypokalemia: Secondary | ICD-10-CM

## 2019-07-03 MED ORDER — GABAPENTIN 100 MG PO CAPS
200.0000 mg | ORAL_CAPSULE | Freq: Three times a day (TID) | ORAL | Status: DC
  Administered 2019-07-03 – 2019-07-04 (×3): 200 mg via ORAL
  Filled 2019-07-03 (×3): qty 2

## 2019-07-03 NOTE — Consult Note (Addendum)
Telepsych Consultation   Reason for Consult:  Behavioral concerns ( IVc)  Referring Physician:  Internal Medicine  Location of Patient:  A314-01 Location of Provider: [facility]  Patient Identification: Gina Bradley MRN:  [id number] Principal Diagnosis: AMS (altered mental status) Diagnosis:  Principal Problem:   AMS (altered mental status) Active Problems:   HTN (hypertension), benign   Hypercholesteremia   COPD (chronic obstructive pulmonary disease) (HCC)   Cervical stenosis of spine   Peripheral neuropathy   GERD (gastroesophageal reflux disease)   Acidosis, metabolic   Total Time spent with patient: 15 minutes  Subjective:   Gina Bradley is a 59 y.o. female was seen via telehealth.  She is awake, alert and oriented x3.  Patient appears to be improving and is less disorganized thorough this assement.  As she reports " not having a good day today,  my side is hurting. "  Continues to deny suicidal or homicidal ideations.  Denies auditory or visual hallucination patient.  Patient has been medication compliant since hospitalized.  Patient denied family history with dementia and/or Alzheimer's denies that she is followed by follow-up appointments psychiatrist.  Patient has been eating and resting well.  Patient on Zyprexa 2.5 mg daily and 5 mg p.o. nightly.  Reported tolerating medications well denied any medication side effects.  Patient has been under review for multiple hospitalizations.  NP attempted to follow-up with patient's husband Madell Heino at [phone] no response.  Orders placed for social work to follow-up with home health and additional outpatient resources.  Case was staffed with attending psychiatrist Lucianne Muss.  Recommend staff to continue to monitor for safety.  Support, encouragement and reassurance was provided.   HPI: Per admission assessment note: -Gina Bradley is a 59 y.o. female patient.  Patient assessed by nurse practitioner via  telemedicine consult.  Patient alert and oriented.  Patient visualized lying down, sitter at bedside. Patient unable to articulate reason for hospital admission, patient states "I do not know."  Patient presents with disorganized speech.  Patient states "I have attention deficit, I thought it was mental but it was my babies my children, I killed the baby I am going to hell."Patient appears paranoid states "why are your eyes so big?!"  Patient presents with clang associations, states "he goes back to 1999, leaving behind, receives PCs."  Patient repeats phrases at times, states "not sorry, not sorry, not sorry, not sorry."Regarding suicidal ideations patient states "I wrapped something around my neck and some room in his hospital."  Patient denies homicidal ideations.  Past Psychiatric History:   Risk to Self: Suicidal Ideation: No Suicidal Intent: No Is patient at risk for suicide?: No Suicidal Plan?: No Access to Means: No What has been your use of drugs/alcohol within the last 12 months?: UTA Other Self Harm Risks: UTA Triggers for Past Attempts: Unknown Intentional Self Injurious Behavior: (UTA) Risk to Others: Homicidal Ideation: No Thoughts of Harm to Others: No Current Homicidal Intent: No Current Homicidal Plan: No Access to Homicidal Means: (UTA) History of harm to others?: (UTA) Assessment of Violence: None Noted Violent Behavior Description: none noted Does patient have access to weapons?: (UTA) Criminal Charges Pending?: No Does patient have a court date: No Prior Inpatient Therapy: Prior Inpatient Therapy: (UTA) Prior Outpatient Therapy: Prior Outpatient Therapy: (UTA)  Past Medical History:  Past Medical History:  Diagnosis Date  . Anemia   . Cervical disc disease   . COPD (chronic obstructive pulmonary disease) (HCC)   . Diabetes  mellitus without complication (HCC)   . Essential hypertension   . GERD (gastroesophageal reflux disease)   . Heart failure (HCC) 2018   . History of cardiac catheterization    Normal coronaries March 2015  . History of non-ST elevation myocardial infarction (NSTEMI)    Secondary to SVT  . Lymphedema   . Peripheral neuropathy   . Spinal stenosis   . SVT (supraventricular tachycardia) (HCC)     Past Surgical History:  Procedure Laterality Date  . CESAREAN SECTION    . ESOPHAGOGASTRODUODENOSCOPY N/A 02/10/2014   LZJ:QBHALP tiny antral erosion; otherwise normal EGD. No explanation for patient's symptoms. Gallbladder needs further evaluation.  [name] LEFT HEART CATHETERIZATION WITH CORONARY ANGIOGRAM N/A 06/03/2013   Procedure: LEFT HEART CATHETERIZATION WITH CORONARY ANGIOGRAM;  Surgeon: Lesleigh Noe, MD;  Location: [facility] CATH LAB;  Service: Cardiovascular;  Laterality: N/A;   Family History:  Family History  Problem Relation Age of Onset  . Heart attack Mother   . Diabetes Mother   . Hypertension Mother   . Angina Mother   . Diabetes Father   . Hypertension Father   . Heart failure Brother   . Suicidality Sister   . Colon cancer Other        Possibly Dad   . Stroke Maternal Grandmother   . Heart failure Maternal Grandmother   . Diabetes Maternal Grandfather   . Hypertension Daughter   . Hypertension Son    Family Psychiatric  History:  Social History:  Social History   Substance and Sexual Activity  Alcohol Use No  . Alcohol/week: 0.0 standard drinks     Social History   Substance and Sexual Activity  Drug Use Yes  . Types: Marijuana    Social History   Socioeconomic History  . Marital status: Married    Spouse name: Not on file  . Number of children: Not on file  . Years of education: Not on file  . Highest education level: Not on file  Occupational History  . Occupation: [employer]    Employer: [employer]  Tobacco Use  . Smoking status: Current Every Day Smoker    Packs/day: 0.50    Years: 18.00    Pack years: 9.00    Types: Cigarettes  . Smokeless tobacco: Never Used  Substance and  Sexual Activity  . Alcohol use: No    Alcohol/week: 0.0 standard drinks  . Drug use: Yes    Types: Marijuana  . Sexual activity: Not Currently    Birth control/protection: Post-menopausal  Other Topics Concern  . Not on file  Social History Narrative  . Not on file   Social Determinants of Health   Financial Resource Strain:   . Difficulty of Paying Living Expenses:   Food Insecurity:   . Worried About Programme researcher, broadcasting/film/video in the Last Year:   . Barista in the Last Year:   Transportation Needs:   . Freight forwarder (Medical):   [name] Lack of Transportation (Non-Medical):   Physical Activity:   . Days of Exercise per Week:   . Minutes of Exercise per Session:   Stress:   . Feeling of Stress :   Social Connections:   . Frequency of Communication with Friends and Family:   . Frequency of Social Gatherings with Friends and Family:   . Attends Religious Services:   . Active Member of Clubs or Organizations:   . Attends Banker Meetings:   [name] Marital Status:  Additional Social History:    Allergies:   Allergies  Allergen Reactions  . Bee Venom Other (See Comments)    Unknown  . Tdap [Tetanus-Diphth-Acell Pertussis] Other (See Comments)    Unknown    Labs:  Results for orders placed or performed during the hospital encounter of 06/26/19 (from the past 48 hour(s))  Glucose, capillary     Status: Abnormal   Collection Time: 07/01/19  9:49 PM  Result Value Ref Range   Glucose-Capillary 139 (H) 70 - 99 mg/dL    Comment: Glucose reference range applies only to samples taken after fasting for at least 8 hours.   Comment 1 Notify RN    Comment 2 Document in Chart     Medications:  Current Facility-Administered Medications  Medication Dose Route Frequency Provider Last Rate Last Admin  . 0.9 %  sodium chloride infusion  250 mL Intravenous PRN Olga Coaster, MD      . acetaminophen (TYLENOL) tablet 650 mg  650 mg Oral Q6H PRN Olga Coaster,  MD   650 mg at 07/01/19 2334   Or  . acetaminophen (TYLENOL) suppository 650 mg  650 mg Rectal Q6H PRN Olga Coaster, MD      . albuterol (PROVENTIL) (2.5 MG/3ML) 0.083% nebulizer solution 2.5 mg  2.5 mg Nebulization BID Sherryll Burger, Pratik D, DO   2.5 mg at 07/03/19 3151  . albuterol (PROVENTIL) (2.5 MG/3ML) 0.083% nebulizer solution 2.5 mg  2.5 mg Nebulization Q4H PRN Sherryll Burger, Pratik D, DO   2.5 mg at 07/01/19 1358  . aspirin EC tablet 81 mg  81 mg Oral Daily Olga Coaster, MD   81 mg at 07/03/19 1012  . atorvastatin (LIPITOR) tablet 20 mg  20 mg Oral QPM Olga Coaster, MD   20 mg at 07/02/19 1707  . bisacodyl (DULCOLAX) suppository 10 mg  10 mg Rectal Daily PRN Olga Coaster, MD      . camphor-menthol [facility]) lotion   Topical PRN Sherryll Burger, Pratik D, DO      . doxycycline (VIBRA-TABS) tablet 100 mg  100 mg Oral Q12H Olga Coaster, MD   100 mg at 07/03/19 1012  . enoxaparin (LOVENOX) injection 40 mg  40 mg Subcutaneous Q24H Olga Coaster, MD   40 mg at 07/03/19 1013  . fluticasone (FLONASE) 50 MCG/ACT nasal spray 1 spray  1 spray Each Nare Daily Shah, Pratik D, DO   1 spray at 07/03/19 1013  . gabapentin (NEURONTIN) capsule 100 mg  100 mg Oral QID Sherryll Burger, Pratik D, DO   100 mg at 07/03/19 1011  . haloperidol lactate (HALDOL) injection 2 mg  2 mg Intravenous Q6H PRN Sherryll Burger, Pratik D, DO   2 mg at 06/30/19 2228  . HYDROcodone-acetaminophen (NORCO) 10-325 MG per tablet 1 tablet  1 tablet Oral Q6H PRN Sherryll Burger, Pratik D, DO   1 tablet at 07/02/19 2245  . isosorbide mononitrate (ISMO) tablet 10 mg  10 mg Oral Daily Olga Coaster, MD   10 mg at 07/03/19 1011  . metoprolol succinate (TOPROL-XL) 24 hr tablet 100 mg  100 mg Oral Daily Olga Coaster, MD   100 mg at 07/03/19 1012  . nitroGLYCERIN (NITROSTAT) SL tablet 0.4 mg  0.4 mg Sublingual Q5 min PRN Olga Coaster, MD      . OLANZapine [facility]) injection 2.5 mg  2.5 mg Intramuscular Once Sherryll Burger, Pratik D, DO      . OLANZapine (ZYPREXA)  tablet 2.5 mg  2.5 mg Oral Daily Sherryll BurgerShah, Pratik D, DO   2.5 mg at 07/03/19 1011  . OLANZapine (ZYPREXA) tablet 5 mg  5 mg Oral QHS Shah, Pratik D, DO   5 mg at 07/02/19 2223  . ondansetron (ZOFRAN) tablet 4 mg  4 mg Oral Q6H PRN Olga CoasterGadhia, Shardul M, MD       Or  . ondansetron [facility](ZOFRAN) injection 4 mg  4 mg Intravenous Q6H PRN Olga CoasterGadhia, Shardul M, MD   4 mg at 07/02/19 2222  . pantoprazole (PROTONIX) EC tablet 40 mg  40 mg Oral BID Olga CoasterGadhia, Shardul M, MD   40 mg at 07/03/19 1012  . polyethylene glycol (MIRALAX / GLYCOLAX) packet 17 g  17 g Oral Daily PRN Olga CoasterGadhia, Shardul M, MD      . potassium chloride SA (KLOR-CON) CR tablet 40 mEq  40 mEq Oral Once Olga CoasterGadhia, Shardul M, MD      . sodium chloride flush (NS) 0.9 % injection 3 mL  3 mL Intravenous Q12H Olga CoasterGadhia, Shardul M, MD   3 mL at 07/03/19 1014  . sodium chloride flush (NS) 0.9 % injection 3 mL  3 mL Intravenous PRN Olga CoasterGadhia, Shardul M, MD      . traMADol Janean Sark(ULTRAM) tablet 50 mg  50 mg Oral Q8H PRN Olga CoasterGadhia, Shardul M, MD   50 mg at 07/01/19 1334  . umeclidinium-vilanterol (ANORO ELLIPTA) 62.5-25 MCG/INH 1 puff  1 puff Inhalation Daily Olga CoasterGadhia, Shardul M, MD   1 puff at 07/03/19 [id number]    Musculoskeletal:  Psychiatric Specialty Exam: Physical Exam  Vitals reviewed.   Review of Systems  Blood pressure 113/67, pulse 98, temperature 98.5 F (36.9 C), temperature source Oral, resp. rate 18, height 5\' 3"  (1.6 m), weight 83.8 kg, last menstrual period 01/05/2015, SpO2 100 %.Body mass index is 32.73 kg/m.  General Appearance: Disheveled but pleasant   Eye Contact:  Good  Speech:  Clear and Coherent  Volume:  Normal  Mood:  Anxious and Depressed  Affect:  Congruent  Thought Process:  Coherent  Orientation:  Full (Time, Place, and Person)  Thought Content:  Logical  Suicidal Thoughts:  No  Homicidal Thoughts:  No  Memory:  Immediate;   Fair Recent;   Fair  Judgement:  Fair  Insight:  Fair  Psychomotor Activity:  Normal  Concentration:  Concentration: Fair   Recall:  FiservFair  Fund of Knowledge:  Fair  Language:  Fair  Akathisia:  No  Handed:  Right  AIMS (if indicated):     Assets:  Communication Skills Desire for Improvement Resilience Social Support  ADL's:  Intact  Cognition:  WNL  Sleep:        Treatment Plan Summary: Daily contact with patient to assess and evaluate symptoms and progress in treatment and Medication management   - Continue Zyprexa 2.5mg  daily and 5mg  po Qhs for mood stabilization - CSW orders placed for home health care and additional outpatient resources.   Disposition: No evidence of imminent risk to self or others at present.   Supportive therapy provided about ongoing stressors. Refer to IOP. Discussed crisis plan, support from social network, calling 911, coming to the Emergency Department, and calling Suicide Hotline.  This service was provided via telemedicine using a 2-way, interactive audio and video technology.  Names of all persons participating in this telemedicine service and their role in this encounter. Name: Karenann Caingela Colgan Role: patient  Name: T.Keeva Reisen Role: NP           Jerene Pitchanika N  Bobby Rumpf, NP 07/03/2019 11:22 AM

## 2019-07-03 NOTE — Progress Notes (Signed)
PROGRESS NOTE    Gina Bradley  OZD:[id number] DOB: [birth year] DOA: 06/26/2019 PCP: Elfredia Nevins, MD     Brief Narrative:  As per H&P written by [name] on 06/27/19 59 y.o. female with medical history significant of COPD, hypertension, hyperlipidemia, peripheral neuropathy who presented to the ER with altered mental status. Patient presented with increased confusion, hallucinations, psychotic behavior.  Patient was brought in yelling and screaming and saying that she is hearing voices.  At the time of my interview she refers to herself in the third person and was referring to all her symptoms as if they were affecting somebody else.  Also seem to have some delusions, confusion and hallucinations.  Possibly acute psychosis with schizophrenia.  Does not seem to have a prior history of schizophrenia but admits to me that she has been diagnosed with "schizoid".  Patient does not really give a relevant history at this time. ED Course:  Vital Signs reviewed on presentation, significant for temperature 98.4, heart rate 106, blood pressure 109/75, saturation 100% on room air. Labs reviewed, significant for arterial blood gas shows a pH of 7.43, PCO2 24, PO2 102, saturation 97%.  Sodium 136, potassium 3.8, bicarb 16, anion gap 20, BUN 11, creatinine 0.59, ALT 52, total bilirubin 1.4, ammonia 34, lactic acid 1.0, WBC count 14.3, hemoglobin 15, hematocrit 46, platelets 272, TSH 1.123. Imaging personally Reviewed, chest x-ray shows no acute cardiopulmonary disease.  CT of the head shows no acute intracranial abnormalities. EKG personally reviewed, shows sinus tachycardia, no acute ST-T changes.   Assessment & Plan: 1-acute hepatic encephalopathy: Presented with increased confusion, hallucinations and psychotic behavior. -Following psychiatry consultation patient has been started on Zyprexa twice a day -Has been found to require inpatient treatment at behavioral facility to further address her mood  disorders. -Currently hemodynamically stable, denying hallucinations or suicidal ideation. -She is oriented x3 and demonstrating normal insight.  2-hypokalemia -Potassium 2.8 on presentation -After repletion/initiation of maintenance supplementation potassium within normal limits -Magnesium also stable.  3-hypertension -Continue metoprolol.  4-hyperlipidemia -Continue Lipitor.  5-bilateral lower extremity cellulitis/left lower extremity open wounds -No signs of active discharge or drainage -Significant improvement in erythematous changes -No fever and normal WBCs -Continue treatment with doxycycline -Patient advised to maintain low-sodium diet and to keep her legs elevated.  6-diastolic congestive heart failure: -Chronic and compensated -Continue blood pressure control, continue treatment with isosorbide and home Lasix dosage. -Heart healthy diet, daily weights and strict I's and O's recommended.  7-peripheral neuropathy -Continue gabapentin -Dose will be adjusted for further control of her neuropathic pain.  8-chronic obstructive pulmonary disease -Stable and compensated -No requiring oxygen supplementation and no wheezing on exam. -Continue as needed albuterol and continue Anoro Ellipta.  9-gastroesophageal reflux disease -Continue PPI.   DVT prophylaxis: lovenox Code Status: Full code Family Communication: no family at bedside   Disposition Plan: Patient mood appears to be stable, currently denying suicidal ideation or hallucinations.  Still complaining of significant pain in her lower extremities which appear to be most likely secondary to neuropathy; will adjust Neurontin dosage and continue treatment with antibiotics.  Patient is otherwise hemodynamically stable and will follow recommendations by psychiatry service for final decision on the need of inpatient treatment.  Consultants:   Psychiatry service  Procedures:   See below for x-ray reports.  2D  echo: 1. Left ventricular ejection fraction, by estimation, is >75%. The left  ventricle has hyperdynamic function. The left ventricle has no regional  wall motion abnormalities. There is mild  left ventricular hypertrophy.  Left ventricular diastolic parameters  are indeterminate.  2. Right ventricular systolic function is normal. The right ventricular  size is normal.  3. The mitral valve is normal in structure. No evidence of mitral valve  regurgitation. No evidence of mitral stenosis.  4. The aortic valve was not well visualized. Aortic valve regurgitation  is not visualized. No aortic stenosis is present.  5. The inferior vena cava is normal in size with greater than 50%  respiratory variability, suggesting right atrial pressure of 3 mmHg.    Antimicrobials:  Anti-infectives (From admission, onward)   Start     Dose/Rate Route Frequency Ordered Stop   06/27/19 1000  doxycycline (VIBRA-TABS) tablet 100 mg     100 mg Oral Every 12 hours 06/27/19 0525     06/27/19 0000  doxycycline (VIBRA-TABS) 100 MG tablet     100 mg Oral Every 12 hours 06/27/19 1108 07/02/19 2359       Subjective: No fever, no nausea, no vomiting.  Patient denies chest pain or shortness of breath.  No suicide ideation hallucinations.  Appropriate mood.  Objective: Vitals:   07/03/19 0635 07/03/19 0837 07/03/19 0838 07/03/19 1355  BP: 113/67   116/69  Pulse: 98   96  Resp: 18   19  Temp: 98.5 F (36.9 C)   98 F (36.7 C)  TempSrc: Oral   Oral  SpO2: 94% 97% 100% 95%  Weight:      Height:        Intake/Output Summary (Last 24 hours) at 07/03/2019 1901 Last data filed at 07/03/2019 1700 Gross per 24 hour  Intake 480 ml  Output --  Net 480 ml   Filed Weights   06/30/19 0505 07/01/19 0500 07/02/19 0639  Weight: 82.7 kg 82.5 kg 83.8 kg    Examination: General exam: Alert, awake, oriented x 3; denies hallucinations, suicidal ideation, fever, chest pain, nausea or vomiting.  Reports pain on  her legs bilaterally secondary to neuropathy. Respiratory system: Clear to auscultation. Respiratory effort normal. Cardiovascular system:RRR. No murmurs, rubs, gallops. Gastrointestinal system: Abdomen is nondistended, soft and nontender. No organomegaly or masses felt. Normal bowel sounds heard. Central nervous system: Alert and oriented. No focal neurological deficits. Extremities/skin: No cyanosis or clubbing; improvement in lower extremity erythematous changes and no signs of active drainage and open wound on her left lower extremity anterior aspect. Psychiatry: Judgement and insight appear stable currently. Mood & affect appropriate.     Data Reviewed: I have personally reviewed following labs and imaging studies  CBC: Recent Labs  Lab 06/26/19 2058 06/27/19 0824  WBC 14.3* 14.8*  NEUTROABS 9.8*  --   HGB 15.0 13.5  HCT 46.4* 40.9  MCV 97.9 96.7  PLT 272 242   Basic Metabolic Panel: Recent Labs  Lab 06/26/19 2058 06/27/19 0824 06/28/19 0755 06/29/19 0646  NA 136 137 137 137  K 2.8* 3.3* 2.9* 3.8  CL 100 106 103 104  CO2 16* 14* 20* 20*  GLUCOSE 91 83 79 98  BUN 11 9 8 8   CREATININE 0.59 0.62 0.59 0.62  CALCIUM 8.8* 8.5* 8.5* 8.9  MG 1.7 1.7 1.6* 1.8   GFR: Estimated Creatinine Clearance: 78.7 mL/min (by C-G formula based on SCr of 0.62 mg/dL).   Liver Function Tests: Recent Labs  Lab 06/26/19 2058  AST 39  ALT 52*  ALKPHOS 57  BILITOT 1.4*  PROT 6.8  ALBUMIN 3.7    Recent Labs  Lab 06/26/19 2058  AMMONIA  34   CBG: Recent Labs  Lab 06/29/19 1659 06/30/19 0726 06/30/19 1029 06/30/19 1539 07/01/19 2149  GLUCAP 89 85 205* 120* 139*   Urine analysis:    Component Value Date/Time   COLORURINE YELLOW 06/26/2019 1956   APPEARANCEUR CLEAR 06/26/2019 1956   LABSPEC 1.013 06/26/2019 1956   PHURINE 6.0 06/26/2019 1956   GLUCOSEU NEGATIVE 06/26/2019 1956   HGBUR LARGE (A) 06/26/2019 1956   BILIRUBINUR NEGATIVE 06/26/2019 1956   KETONESUR 80  (A) 06/26/2019 1956   PROTEINUR >=300 (A) 06/26/2019 1956   UROBILINOGEN 1.0 07/29/2009 1213   NITRITE NEGATIVE 06/26/2019 1956   LEUKOCYTESUR NEGATIVE 06/26/2019 1956    Recent Results (from the past 240 hour(s))  SARS CORONAVIRUS 2 (TAT 6-24 HRS) Nasopharyngeal Nasopharyngeal Swab     Status: None   Collection Time: 06/24/19  4:47 PM   Specimen: Nasopharyngeal Swab  Result Value Ref Range Status   SARS Coronavirus 2 NEGATIVE NEGATIVE Final    Comment: (NOTE) SARS-CoV-2 target nucleic acids are NOT DETECTED. The SARS-CoV-2 RNA is generally detectable in upper and lower respiratory specimens during the acute phase of infection. Negative results do not preclude SARS-CoV-2 infection, do not rule out co-infections with other pathogens, and should not be used as the sole basis for treatment or other patient management decisions. Negative results must be combined with clinical observations, patient history, and epidemiological information. The expected result is Negative. Fact Sheet for Patients: HairSlick.no Fact Sheet for Healthcare Providers: quierodirigir.com This test is not yet approved or cleared by the [place] FDA and  has been authorized for detection and/or diagnosis of SARS-CoV-2 by FDA under an Emergency Use Authorization (EUA). This EUA will remain  in effect (meaning this test can be used) for the duration of the COVID-19 declaration under Section 56 4(b)(1) of the Act, 21 U.S.C. section 360bbb-3(b)(1), unless the authorization is terminated or revoked sooner. Performed at [facility], [address] [place], [place] [address]   SARS CORONAVIRUS 2 (TAT 6-24 HRS) Nasopharyngeal Nasopharyngeal Swab     Status: None   Collection Time: 06/27/19  2:07 AM   Specimen: Nasopharyngeal Swab  Result Value Ref Range Status   SARS Coronavirus 2 NEGATIVE NEGATIVE Final    Comment: (NOTE) SARS-CoV-2 target nucleic  acids are NOT DETECTED. The SARS-CoV-2 RNA is generally detectable in upper and lower respiratory specimens during the acute phase of infection. Negative results do not preclude SARS-CoV-2 infection, do not rule out co-infections with other pathogens, and should not be used as the sole basis for treatment or other patient management decisions. Negative results must be combined with clinical observations, patient history, and epidemiological information. The expected result is Negative. Fact Sheet for Patients: HairSlick.no Fact Sheet for Healthcare Providers: quierodirigir.com This test is not yet approved or cleared by the [place] FDA and  has been authorized for detection and/or diagnosis of SARS-CoV-2 by FDA under an Emergency Use Authorization (EUA). This EUA will remain  in effect (meaning this test can be used) for the duration of the COVID-19 declaration under Section 56 4(b)(1) of the Act, 21 U.S.C. section 360bbb-3(b)(1), unless the authorization is terminated or revoked sooner. Performed at [facility], [address], [place], [place] [address]      Radiology Studies: No results found.   Scheduled Meds: . albuterol  2.5 mg Nebulization BID  . aspirin EC  81 mg Oral Daily  . atorvastatin  20 mg Oral QPM  . doxycycline  100  mg Oral Q12H  . enoxaparin (LOVENOX) injection  40 mg Subcutaneous Q24H  . fluticasone  1 spray Each Nare Daily  . gabapentin  200 mg Oral TID  . isosorbide mononitrate  10 mg Oral Daily  . metoprolol succinate  100 mg Oral Daily  . OLANZapine  2.5 mg Intramuscular Once  . OLANZapine  2.5 mg Oral Daily  . OLANZapine  5 mg Oral QHS  . pantoprazole  40 mg Oral BID  . potassium chloride SA  40 mEq Oral Once  . sodium chloride flush  3 mL Intravenous Q12H  . umeclidinium-vilanterol  1 puff Inhalation Daily   Continuous Infusions: . sodium chloride       LOS: 0 days    Time  spent: 25 minutes.   Vassie Loll, MD [facility] Pager [phone]   07/03/2019, 7:01 PM

## 2019-07-03 NOTE — Progress Notes (Signed)
CSW sent updated nursing/provider notes to [facility] at their request. They will review pt again today.   [facility] declined pt for behavioral acuity (IVC states pt had been defecating on self and refusing to eat).   Disposition will continue to follow.   Wells Guiles, LCSW, LCAS Disposition CSW [facility] [phone]

## 2019-07-04 DIAGNOSIS — I10 Essential (primary) hypertension: Secondary | ICD-10-CM | POA: Diagnosis not present

## 2019-07-04 DIAGNOSIS — E876 Hypokalemia: Secondary | ICD-10-CM

## 2019-07-04 DIAGNOSIS — E872 Acidosis: Secondary | ICD-10-CM | POA: Diagnosis not present

## 2019-07-04 DIAGNOSIS — I509 Heart failure, unspecified: Secondary | ICD-10-CM | POA: Diagnosis not present

## 2019-07-04 DIAGNOSIS — I110 Hypertensive heart disease with heart failure: Secondary | ICD-10-CM | POA: Diagnosis not present

## 2019-07-04 DIAGNOSIS — Z6833 Body mass index (BMI) 33.0-33.9, adult: Secondary | ICD-10-CM

## 2019-07-04 DIAGNOSIS — F23 Brief psychotic disorder: Secondary | ICD-10-CM

## 2019-07-04 DIAGNOSIS — J410 Simple chronic bronchitis: Secondary | ICD-10-CM | POA: Diagnosis not present

## 2019-07-04 DIAGNOSIS — Z20822 Contact with and (suspected) exposure to covid-19: Secondary | ICD-10-CM | POA: Diagnosis not present

## 2019-07-04 DIAGNOSIS — G9340 Encephalopathy, unspecified: Secondary | ICD-10-CM | POA: Diagnosis not present

## 2019-07-04 DIAGNOSIS — J449 Chronic obstructive pulmonary disease, unspecified: Secondary | ICD-10-CM | POA: Diagnosis not present

## 2019-07-04 DIAGNOSIS — E6609 Other obesity due to excess calories: Secondary | ICD-10-CM

## 2019-07-04 DIAGNOSIS — L0390 Cellulitis, unspecified: Secondary | ICD-10-CM

## 2019-07-04 DIAGNOSIS — L03119 Cellulitis of unspecified part of limb: Secondary | ICD-10-CM

## 2019-07-04 DIAGNOSIS — R410 Disorientation, unspecified: Secondary | ICD-10-CM | POA: Diagnosis not present

## 2019-07-04 MED ORDER — HYDROCODONE-ACETAMINOPHEN 10-325 MG PO TABS: 1.0000 | ORAL_TABLET | Freq: Four times a day (QID) | ORAL | Status: DC | PRN

## 2019-07-04 MED ORDER — GABAPENTIN 100 MG PO CAPS: 200.0000 mg | ORAL_CAPSULE | Freq: Three times a day (TID) | ORAL | Status: DC

## 2019-07-04 MED ORDER — OLANZAPINE 2.5 MG PO TABS: 2.5000 mg | ORAL_TABLET | Freq: Every day | ORAL | 1 refills | Status: DC

## 2019-07-04 MED ORDER — OLANZAPINE 5 MG PO TABS: 5.0000 mg | ORAL_TABLET | Freq: Every day | ORAL | 1 refills | Status: DC

## 2019-07-04 MED ORDER — DOXYCYCLINE HYCLATE 100 MG PO TABS: 100.0000 mg | ORAL_TABLET | Freq: Two times a day (BID) | ORAL | 0 refills | Status: AC

## 2019-07-04 NOTE — Discharge Summary (Signed)
Physician Discharge Summary  Gina Bradley UEA:[id number] DOB: [birth year] DOA: 06/26/2019  PCP: Elfredia NevinsFusco, Lawrence, MD  Admit date: 06/26/2019 Discharge date: 07/04/2019  Time spent: 35 minutes  Recommendations for Outpatient Follow-up:  1. Repeat basic metabolic panel to follow electrolytes and renal function 2. Reassess blood pressure and further adjust antihypertensive regimen as needed.   Discharge Diagnoses:  Principal Problem:   AMS (altered mental status) Active Problems:   HTN (hypertension), benign   Hypercholesteremia   COPD (chronic obstructive pulmonary disease) (HCC)   Cervical stenosis of spine   Peripheral neuropathy   GERD (gastroesophageal reflux disease)   Acidosis, metabolic   Acute psychosis (HCC)   Hypokalemia   Class 1 obesity due to excess calories with body mass index (BMI) of 33.0 to 33.9 in adult   Cellulitis of lower extremity   Discharge Condition: Stable and improved.  Patient discharged home with instruction to follow-up with PCP and psychiatry service as an outpatient.  CODE STATUS: Full code.  Diet recommendation: Heart healthy diet.  Filed Weights   07/01/19 0500 07/02/19 0639 07/04/19 0700  Weight: 82.5 kg 83.8 kg 85.9 kg    History of present illness:  As per H&P written by Dr. Teodoro KilGadhia on 06/27/19 59 y.o.femalewith medical history significant ofCOPD, hypertension, hyperlipidemia, peripheral neuropathy who presented to the ER with altered mental status. Patient presented with increased confusion, hallucinations, psychotic behavior. Patient was brought in yelling and screaming and saying that she is hearing voices. At the time of my interview she refers to herself in the third person and was referring to all her symptoms as if they were affecting somebody else. Also seem to have some delusions, confusion and hallucinations. Possibly acute psychosis with schizophrenia. Does not seem to have a prior history of schizophrenia but admits to me  that she has been diagnosed with "schizoid". Patient does not really give a relevant history at this time. ED Course: Vital Signs reviewed on presentation, significant fortemperature 98.4, heart rate 106, blood pressure 109/75, saturation 100% on room air. Labs reviewed, significant forarterial blood gas shows a pH of 7.43, PCO2 24, PO2 102, saturation 97%. Sodium 136, potassium 3.8, bicarb 16, anion gap 20, BUN 11, creatinine 0.59, ALT 52, total bilirubin 1.4, ammonia 34, lactic acid 1.0, WBC count 14.3, hemoglobin 15, hematocrit 46, platelets 272, TSH 1.123. Imaging personally Reviewed,chest x-ray shows no acute cardiopulmonary disease. CT of the head shows no acute intracranial abnormalities. EKG personally reviewed, showssinus tachycardia, no acute ST-T changes.  Hospital Course:  1-acute hepatic encephalopathy: Presented with increased confusion, hallucinations and psychotic behavior. -Following psychiatry recommendations patient has been started on Zyprexa twice a day (2.5 mg in the morning and 5 mg at bedtime), in order to achieve mood stabilization and prevent psychosis. -Has been cleared by psychiatry service to go home and follow-up as an outpatient -Currently hemodynamically stable, denying hallucinations or suicidal ideation. -She is oriented x3 and demonstrating normal insight.  2-hypokalemia -Potassium 2.8 on presentation -After repletion/initiation of maintenance supplementation potassium within normal limits -Magnesium also stable. -Repeat basic metabolic panel follow-up visit to reassess electrolytes trend and the stability.  3-hypertension -Continue metoprolol and heart healthy diet.[name].  4-hyperlipidemia -Continue Lipitor.  5-bilateral lower extremity cellulitis/left lower extremity open wound -No signs of active drainage -Significant improvement in erythematous changes -No fever and normal WBCs -Continue treatment with oral doxycycline to complete antibiotic  therapy. -Patient advised to maintain low-sodium diet and to keep her legs elevated.  6-chronic diastolic congestive heart failure: -Compensated overall -  Continue blood pressure control, continue treatment with isosorbide and as needed home Lasix dosage. -Heart healthy diet and daily weight has been instructed.  7-peripheral neuropathy -Continue gabapentin -Dose will be adjusted for further control of her neuropathic pain. -Instructed to take 200 mg 3 times a day at discharge; recommending close monitoring by PCP with further adjustment to medication as needed.  8-chronic obstructive pulmonary disease -Stable and compensated -No wheezing on exam. -No requiring oxygen supplementation and no wheezing on exam. -Continue as needed albuterol and continue Anoro Ellipta.  9-gastroesophageal reflux disease -Continue PPI.  10-class I obesity -Low calorie diet, portion control increase physical activity discussed with patient -Body mass index is 33.55 kg/m.   Procedures:  See below for x-ray reports.  Consultations:  Psychiatry service  Discharge Exam: Vitals:   07/04/19 0544 07/04/19 0740  BP: (!) 102/48   Pulse: 93   Resp: 15   Temp: 97.9 F (36.6 C)   SpO2: 97% 96%   General exam: Alert, awake, oriented x 3; denies hallucinations, suicidal ideation, fever, chest pain, nausea or vomiting.  Reports pain on her legs has improved significantly after adjusting her neurontin.  Respiratory system: Clear to auscultation. Respiratory effort normal. Cardiovascular system:RRR. No murmurs, rubs, gallops. Gastrointestinal system: Abdomen is nondistended, soft and nontender. No organomegaly or masses felt. Normal bowel sounds heard. Central nervous system: Alert and oriented. No focal neurological deficits. Extremities/skin: No cyanosis or clubbing; improvement in lower extremity erythematous changes and no signs of active drainage and open wound on her left lower extremity anterior  aspect. Psychiatry: Judgement and insight appear stable currently. Mood & affect appropriate.    Discharge Instructions   Discharge Instructions    (HEART FAILURE PATIENTS) Call MD:  Anytime you have any of the following symptoms: 1) 3 pound weight gain in 24 hours or 5 pounds in 1 week 2) shortness of breath, with or without a dry hacking cough 3) swelling in the hands, feet or stomach 4) if you have to sleep on extra pillows at night in order to breathe.   Complete by: As directed    Diet - low sodium heart healthy   Complete by: As directed    Diet - low sodium heart healthy   Complete by: As directed    Discharge instructions   Complete by: As directed    Maintain adequate hydration Arrange follow-up with PCP in 10 days Take medications as prescribed Outpatient follow-up with psychiatrist in the next 2 weeks. Maintain extremities elevated when sitting down and make sure to alternate positions from standing or sitting, to prevent too many hours straight of either one. Follow heart healthy/low-sodium diet Check your weight on daily basis.   Increase activity slowly   Complete by: As directed      Allergies as of 07/04/2019      Reactions   Bee Venom Other (See Comments)   Unknown   Tdap [tetanus-diphth-acell Pertussis] Other (See Comments)   Unknown      Medication List    STOP taking these medications   doxycycline 100 MG capsule Commonly known as: VIBRAMYCIN Replaced by: doxycycline 100 MG tablet   methocarbamol 500 MG tablet Commonly known as: ROBAXIN   Oxycodone HCl 10 MG Tabs     TAKE these medications   albuterol 108 (90 Base) MCG/ACT inhaler Commonly known as: VENTOLIN HFA Inhale into the lungs every 6 (six) hours as needed for wheezing or shortness of breath.   albuterol (2.5 MG/3ML) 0.083% nebulizer solution Commonly  known as: PROVENTIL Take 2.5 mg by nebulization 4 (four) times daily.   Anoro Ellipta 62.5-25 MCG/INH Aepb Generic drug:  umeclidinium-vilanterol Inhale 1 puff into the lungs daily.   aspirin 81 MG EC tablet Take 1 tablet (81 mg total) by mouth daily.   atorvastatin 20 MG tablet Commonly known as: LIPITOR Take 20 mg by mouth every evening.   co-enzyme Q-10 30 MG capsule Take 100 mg by mouth daily.   doxycycline 100 MG tablet Commonly known as: VIBRA-TABS Take 1 tablet (100 mg total) by mouth every 12 (twelve) hours for 5 days. Replaces: doxycycline 100 MG capsule   EPINEPHrine 0.3 mg/0.3 mL Soaj injection Commonly known as: EpiPen 2-Pak Inject 0.3 mLs (0.3 mg total) into the muscle once as needed (for severe allergic reaction). CAll 911 immediately if you have to use this medicine   fluticasone 50 MCG/ACT nasal spray Commonly known as: FLONASE Place 2 sprays into both nostrils daily.   furosemide 20 MG tablet Commonly known as: LASIX TAKE (1) TABLET BY MOUTH 2 TIMES DAILY AS NEEDED. What changed: See the new instructions.   gabapentin 100 MG capsule Commonly known as: NEURONTIN Take 2 capsules (200 mg total) by mouth 3 (three) times daily. What changed: when to take this   HYDROcodone-acetaminophen 10-325 MG tablet Commonly known as: NORCO Take 1 tablet by mouth every 6 (six) hours as needed for severe pain. pain What changed:   when to take this  reasons to take this   ibuprofen 600 MG tablet Commonly known as: ADVIL Take 1 tablet (600 mg total) by mouth 4 (four) times daily.   isosorbide mononitrate 10 MG tablet Commonly known as: ISMO Take 10 mg by mouth daily.   losartan 25 MG tablet Commonly known as: COZAAR Take 25 mg by mouth daily.   metFORMIN 500 MG tablet Commonly known as: GLUCOPHAGE Take 500 mg by mouth 2 (two) times daily.   metoprolol succinate 100 MG 24 hr tablet Commonly known as: TOPROL-XL TAKE (1) TABLET BY MOUTH ONCE DAILY. What changed: See the new instructions.   nitroGLYCERIN 0.4 MG SL tablet Commonly known as: Nitrostat Place 1 tablet (0.4 mg  total) under the tongue every 5 (five) minutes as needed for chest pain.   OLANZapine 5 MG tablet Commonly known as: ZYPREXA Take 1 tablet (5 mg total) by mouth at bedtime.   OLANZapine 2.5 MG tablet Commonly known as: ZYPREXA Take 1 tablet (2.5 mg total) by mouth daily. Start taking on: July 05, 2019   pantoprazole 40 MG tablet Commonly known as: PROTONIX Take 40 mg by mouth 2 (two) times daily.   potassium chloride SA 20 MEQ tablet Commonly known as: KLOR-CON Take 1 tablet (20 mEq total) by mouth 2 (two) times daily.   predniSONE 5 MG tablet Commonly known as: DELTASONE Take 10 mg by mouth 2 (two) times daily.   primidone 50 MG tablet Commonly known as: MYSOLINE Take 50 mg by mouth at bedtime.      Allergies  Allergen Reactions  . Bee Venom Other (See Comments)    Unknown  . Tdap [Tetanus-Diphth-Acell Pertussis] Other (See Comments)    Unknown   Follow-up Information    Elfredia Nevins, MD Follow up in 10 day(s).   Specialty: Internal Medicine Contact information: [address] [place] [address] [phone]        Services, [facility] Follow up on 07/08/2019.   Why: You are scheduled for an appointment with [facility] on Monday, April 5th at  8:00am Contact information: [address] [place] [address] [phone]           The results of significant diagnostics from this hospitalization (including imaging, microbiology, ancillary and laboratory) are listed below for reference.    Significant Diagnostic Studies: CT Head Wo Contrast  Result Date: 06/26/2019 CLINICAL DATA:  Ataxia EXAM: CT HEAD WITHOUT CONTRAST TECHNIQUE: Contiguous axial images were obtained from the base of the skull through the vertex without intravenous contrast. COMPARISON:  06/24/2019 FINDINGS: Brain: Mild diffuse cerebral atrophy. No acute intracranial abnormality. Specifically, no hemorrhage, hydrocephalus, mass lesion, acute infarction, or significant  intracranial injury. Vascular: No hyperdense vessel or unexpected calcification. Skull: No acute calvarial abnormality. Sinuses/Orbits: Visualized paranasal sinuses and mastoids clear. Orbital soft tissues unremarkable. Other: None IMPRESSION: No acute intracranial abnormality. Electronically Signed   By: Charlett Nose M.D.   On: 06/26/2019 22:04   CT Head Wo Contrast  Result Date: 06/24/2019 CLINICAL DATA:  Neuro deficit(s), subacute AMS EXAM: CT HEAD WITHOUT CONTRAST TECHNIQUE: Contiguous axial images were obtained from the base of the skull through the vertex without intravenous contrast. COMPARISON:  Head CT 08/23/2017 FINDINGS: Brain: Generalized atrophy is similar to prior exam. Minimal chronic small vessel ischemia. No evidence of acute infarction, hemorrhage, hydrocephalus, extra-axial collection or mass lesion/mass effect. Vascular: No hyperdense vessel or unexpected calcification. Skull: Slight heterogeneous density of the calvarium without focal lesion. No fracture. Sinuses/Orbits: Few scattered mucous retention cyst in the paranasal sinuses. No acute findings. The mastoid air cells are clear. Orbits are unremarkable. Other: None. IMPRESSION: 1.  No acute intracranial abnormality. 2. Mild atrophy and chronic small vessel ischemia. Electronically Signed   By: Narda Rutherford M.D.   On: 06/24/2019 17:14   DG Chest Port 1 View  Result Date: 06/26/2019 CLINICAL DATA:  Weakness EXAM: PORTABLE CHEST 1 VIEW COMPARISON:  06/24/2019 FINDINGS: Heart is borderline in size. Rounded density at the right cardiophrenic angle, likely epicardial fat pad. No confluent opacities or effusions. No acute bony abnormality. IMPRESSION: No active disease. Electronically Signed   By: Charlett Nose M.D.   On: 06/26/2019 21:37   DG Chest Port 1 View  Result Date: 06/24/2019 CLINICAL DATA:  Altered mental status EXAM: PORTABLE CHEST 1 VIEW COMPARISON:  11/07/2016 FINDINGS: Lungs are clear.  No pleural effusion or  pneumothorax. Heart is normal in size. Prominent epicardial fat along the right heart border. Old bilateral rib fracture deformities. IMPRESSION: No evidence of acute cardiopulmonary disease. Electronically Signed   By: Charline Bills M.D.   On: 06/24/2019 17:26   ECHOCARDIOGRAM COMPLETE  Result Date: 06/27/2019    ECHOCARDIOGRAM REPORT   Patient Name:   Gina Bradley Date of Exam: 06/27/2019 Medical Rec #:  [id number]       Height:       62.0 in Accession #:    [id number]      Weight:       200.0 lb Date of Birth:  [birth year]       BSA:          1.912 m Patient Age:    58 years        BP:           126/90 mmHg Patient Gender: F               HR:           121 bpm. [facility] Procedure: 2D Echo Indications:    Congestive Heart Failure 428.0 /  I50.9  History:        Patient has prior history of Echocardiogram examinations, most                 recent 11/29/2016. COPD; Risk Factors:Current Smoker,                 Hypertension and Dyslipidemia. SVT, Chest Pressure, Acidosis,                 metabolic.  Sonographer:    Jeryl Columbia RDCS (AE) Referring Phys: [id number] [facility] M GADHIA IMPRESSIONS  1. Left ventricular ejection fraction, by estimation, is >75%. The left ventricle has hyperdynamic function. The left ventricle has no regional wall motion abnormalities. There is mild left ventricular hypertrophy. Left ventricular diastolic parameters are indeterminate.  2. Right ventricular systolic function is normal. The right ventricular size is normal.  3. The mitral valve is normal in structure. No evidence of mitral valve regurgitation. No evidence of mitral stenosis.  4. The aortic valve was not well visualized. Aortic valve regurgitation is not visualized. No aortic stenosis is present.  5. The inferior vena cava is normal in size with greater than 50% respiratory variability, suggesting right atrial pressure of 3 mmHg. FINDINGS  Left Ventricle: There is turbulent LVOT flow created by  hyperdynamic LV with sytolic cavity obliteration and SAM of the anterior mitral valve leaflet, technically unable to measure gradient. Left ventricular ejection fraction, by estimation, is >75%. The left ventricle has hyperdynamic function. The left ventricle has no regional wall motion abnormalities. The left ventricular internal cavity size was normal in size. There is mild left ventricular hypertrophy. Left ventricular diastolic parameters are indeterminate. Right Ventricle: The right ventricular size is normal. No increase in right ventricular wall thickness. Right ventricular systolic function is normal. Left Atrium: Left atrial size was normal in size. Right Atrium: Right atrial size was normal in size. Pericardium: There is no evidence of pericardial effusion. Mitral Valve: The mitral valve is normal in structure. No evidence of mitral valve regurgitation. No evidence of mitral valve stenosis. Tricuspid Valve: The tricuspid valve is normal in structure. Tricuspid valve regurgitation is not demonstrated. No evidence of tricuspid stenosis. Aortic Valve: The aortic valve was not well visualized. Aortic valve regurgitation is not visualized. No aortic stenosis is present. Aortic valve mean gradient measures 8.7 mmHg. Aortic valve peak gradient measures 14.9 mmHg. Aortic valve area, by VTI measures 1.63 cm. Pulmonic Valve: The pulmonic valve was not well visualized. Pulmonic valve regurgitation is not visualized. No evidence of pulmonic stenosis. Aorta: The aortic root is normal in size and structure. Pulmonary Artery: Indeterminant PASP, inadequate TR jet. Venous: The inferior vena cava is normal in size with greater than 50% respiratory variability, suggesting right atrial pressure of 3 mmHg. IAS/Shunts: The interatrial septum was not well visualized.  LEFT VENTRICLE PLAX 2D LVIDd:         3.85 cm  Diastology LVIDs:         2.89 cm  LV e' lateral:   5.77 cm/s LV PW:         1.03 cm  LV E/e' lateral: 10.9 LV  IVS:        1.11 cm  LV e' medial:    14.50 cm/s LVOT diam:     2.10 cm  LV E/e' medial:  4.3 LV SV:         60 LV SV Index:   32 LVOT Area:     3.46 cm  RIGHT VENTRICLE  RV S prime:     31.00 cm/s TAPSE (M-mode): 1.6 cm LEFT ATRIUM             Index LA diam:        2.60 cm 1.36 cm/m LA Vol (A2C):   30.2 ml 15.80 ml/m LA Vol (A4C):   17.6 ml 9.21 ml/m LA Biplane Vol: 29.6 ml 15.48 ml/m  AORTIC VALVE AV Area (Vmax):    2.43 cm AV Area (Vmean):   2.09 cm AV Area (VTI):     1.63 cm AV Vmax:           193.05 cm/s AV Vmean:          140.052 cm/s AV VTI:            0.370 m AV Peak Grad:      14.9 mmHg AV Mean Grad:      8.7 mmHg LVOT Vmax:         135.70 cm/s LVOT Vmean:        84.518 cm/s LVOT VTI:          0.175 m LVOT/AV VTI ratio: 0.47  AORTA Ao Root diam: 3.20 cm MITRAL VALVE MV Area (PHT): 5.88 cm     SHUNTS MV Decel Time: 129 msec     Systemic VTI:  0.17 m MV E velocity: 62.80 cm/s   Systemic Diam: 2.10 cm MV A velocity: 141.00 cm/s MV E/A ratio:  0.45 Dina Rich MD Electronically signed by Dina Rich MD Signature Date/Time: 06/27/2019/12:58:31 PM    Final     Microbiology: Recent Results (from the past 240 hour(s))  SARS CORONAVIRUS 2 (TAT 6-24 HRS) Nasopharyngeal Nasopharyngeal Swab     Status: None   Collection Time: 06/24/19  4:47 PM   Specimen: Nasopharyngeal Swab  Result Value Ref Range Status   SARS Coronavirus 2 NEGATIVE NEGATIVE Final    Comment: (NOTE) SARS-CoV-2 target nucleic acids are NOT DETECTED. The SARS-CoV-2 RNA is generally detectable in upper and lower respiratory specimens during the acute phase of infection. Negative results do not preclude SARS-CoV-2 infection, do not rule out co-infections with other pathogens, and should not be used as the sole basis for treatment or other patient management decisions. Negative results must be combined with clinical observations, patient history, and epidemiological information. The expected result is Negative. Fact  Sheet for Patients: HairSlick.no Fact Sheet for Healthcare Providers: quierodirigir.com This test is not yet approved or cleared by the [place] FDA and  has been authorized for detection and/or diagnosis of SARS-CoV-2 by FDA under an Emergency Use Authorization (EUA). This EUA will remain  in effect (meaning this test can be used) for the duration of the COVID-19 declaration under Section 56 4(b)(1) of the Act, 21 U.S.C. section 360bbb-3(b)(1), unless the authorization is terminated or revoked sooner. Performed at [facility], [address], [place], [place] [address]   SARS CORONAVIRUS 2 (TAT 6-24 HRS) Nasopharyngeal Nasopharyngeal Swab     Status: None   Collection Time: 06/27/19  2:07 AM   Specimen: Nasopharyngeal Swab  Result Value Ref Range Status   SARS Coronavirus 2 NEGATIVE NEGATIVE Final    Comment: (NOTE) SARS-CoV-2 target nucleic acids are NOT DETECTED. The SARS-CoV-2 RNA is generally detectable in upper and lower respiratory specimens during the acute phase of infection. Negative results do not preclude SARS-CoV-2 infection, do not rule out co-infections with other pathogens, and should not be used as the sole basis for treatment or other patient management  decisions. Negative results must be combined with clinical observations, patient history, and epidemiological information. The expected result is Negative. Fact Sheet for Patients: SugarRoll.be Fact Sheet for Healthcare Providers: https://www.woods-mathews.com/ This test is not yet approved or cleared by the [place] FDA and  has been authorized for detection and/or diagnosis of SARS-CoV-2 by FDA under an Emergency Use Authorization (EUA). This EUA will remain  in effect (meaning this test can be used) for the duration of the COVID-19 declaration under Section 56 4(b)(1) of the Act, 21 U.S.C. section  360bbb-3(b)(1), unless the authorization is terminated or revoked sooner. Performed at [facility], [place] [address] [place], [place] [address]      Labs: Basic Metabolic Panel: Recent Labs  Lab 06/28/19 0755 06/29/19 0646  NA 137 137  K 2.9* 3.8  CL 103 104  CO2 20* 20*  GLUCOSE 79 98  BUN 8 8  CREATININE 0.59 0.62  CALCIUM 8.5* 8.9  MG 1.6* 1.8    BNP (last 3 results) Recent Labs    06/27/19 0824  BNP 111.0*    CBG: Recent Labs  Lab 06/29/19 1659 06/30/19 0726 06/30/19 1029 06/30/19 1539 07/01/19 2149  GLUCAP 89 85 205* 120* 139*    Signed:  Barton Dubois MD.  [facility] 07/04/2019, 3:08 PM

## 2019-07-11 DIAGNOSIS — G9340 Encephalopathy, unspecified: Secondary | ICD-10-CM | POA: Diagnosis not present

## 2019-07-11 DIAGNOSIS — E782 Mixed hyperlipidemia: Secondary | ICD-10-CM | POA: Diagnosis not present

## 2019-07-11 DIAGNOSIS — E876 Hypokalemia: Secondary | ICD-10-CM | POA: Diagnosis not present

## 2019-08-01 ENCOUNTER — Other Ambulatory Visit: Payer: Self-pay | Admitting: Physician Assistant

## 2019-08-01 ENCOUNTER — Other Ambulatory Visit (HOSPITAL_COMMUNITY): Payer: Self-pay | Admitting: Physician Assistant

## 2019-08-01 DIAGNOSIS — L0390 Cellulitis, unspecified: Secondary | ICD-10-CM | POA: Diagnosis not present

## 2019-08-01 DIAGNOSIS — R6251 Failure to thrive (child): Secondary | ICD-10-CM | POA: Diagnosis not present

## 2019-08-01 DIAGNOSIS — G64 Other disorders of peripheral nervous system: Secondary | ICD-10-CM | POA: Diagnosis not present

## 2019-08-01 DIAGNOSIS — S39011A Strain of muscle, fascia and tendon of abdomen, initial encounter: Secondary | ICD-10-CM

## 2019-08-01 DIAGNOSIS — G894 Chronic pain syndrome: Secondary | ICD-10-CM | POA: Diagnosis not present

## 2019-08-01 DIAGNOSIS — J449 Chronic obstructive pulmonary disease, unspecified: Secondary | ICD-10-CM | POA: Diagnosis not present

## 2019-08-01 DIAGNOSIS — S39013A Strain of muscle, fascia and tendon of pelvis, initial encounter: Secondary | ICD-10-CM

## 2019-08-22 ENCOUNTER — Other Ambulatory Visit: Payer: Self-pay | Admitting: Internal Medicine

## 2019-08-22 NOTE — Telephone Encounter (Signed)
This is a [place] pt.  °

## 2019-09-02 DIAGNOSIS — I10 Essential (primary) hypertension: Secondary | ICD-10-CM | POA: Diagnosis not present

## 2019-09-02 DIAGNOSIS — J449 Chronic obstructive pulmonary disease, unspecified: Secondary | ICD-10-CM | POA: Diagnosis not present

## 2019-09-02 DIAGNOSIS — Z720 Tobacco use: Secondary | ICD-10-CM | POA: Diagnosis not present

## 2019-09-02 DIAGNOSIS — E1140 Type 2 diabetes mellitus with diabetic neuropathy, unspecified: Secondary | ICD-10-CM | POA: Diagnosis not present

## 2019-09-10 DIAGNOSIS — G64 Other disorders of peripheral nervous system: Secondary | ICD-10-CM | POA: Diagnosis not present

## 2019-09-10 DIAGNOSIS — J449 Chronic obstructive pulmonary disease, unspecified: Secondary | ICD-10-CM | POA: Diagnosis not present

## 2019-09-10 DIAGNOSIS — G894 Chronic pain syndrome: Secondary | ICD-10-CM | POA: Diagnosis not present

## 2019-09-12 ENCOUNTER — Telehealth: Payer: Medicare Other | Admitting: Cardiology

## 2019-10-02 ENCOUNTER — Telehealth (INDEPENDENT_AMBULATORY_CARE_PROVIDER_SITE_OTHER): Payer: Medicare Other | Admitting: Cardiology

## 2019-10-02 ENCOUNTER — Encounter: Payer: Self-pay | Admitting: Cardiology

## 2019-10-02 ENCOUNTER — Other Ambulatory Visit: Payer: Self-pay

## 2019-10-02 VITALS — Ht 61.0 in | Wt 190.0 lb

## 2019-10-02 DIAGNOSIS — I471 Supraventricular tachycardia: Secondary | ICD-10-CM

## 2019-10-02 DIAGNOSIS — E1140 Type 2 diabetes mellitus with diabetic neuropathy, unspecified: Secondary | ICD-10-CM | POA: Diagnosis not present

## 2019-10-02 DIAGNOSIS — I10 Essential (primary) hypertension: Secondary | ICD-10-CM

## 2019-10-02 DIAGNOSIS — Z720 Tobacco use: Secondary | ICD-10-CM | POA: Diagnosis not present

## 2019-10-02 DIAGNOSIS — J449 Chronic obstructive pulmonary disease, unspecified: Secondary | ICD-10-CM | POA: Diagnosis not present

## 2019-10-02 NOTE — Patient Instructions (Addendum)
Medication Instructions:   Your physician recommends that you continue on your current medications as directed. Please refer to the Current Medication list given to you today.  Labwork:   NONE  Testing/Procedures:  NONE  Follow-Up:  Your physician recommends that you schedule a follow-up appointment in: 1 year (office). You will receive a reminder letter in the mail in about 10 months reminding you to call and schedule your appointment. If you don't receive this letter, please contact our office.  Any Other Special Instructions Will Be Listed Below (If Applicable).  If you need a refill on your cardiac medications before your next appointment, please call your pharmacy. 

## 2019-10-02 NOTE — Progress Notes (Signed)
Virtual Visit via Telephone Note   This visit type was conducted due to national recommendations for restrictions regarding the COVID-19 Pandemic (e.g. social distancing) in an effort to limit this patient's exposure and mitigate transmission in our community.  Due to her co-morbid illnesses, this patient is at least at moderate risk for complications without adequate follow up.  This format is felt to be most appropriate for this patient at this time.  The patient did not have access to video technology/had technical difficulties with video requiring transitioning to audio format only (telephone).  All issues noted in this document were discussed and addressed.  No physical exam could be performed with this format.  Please refer to the patient's chart for her  consent to telehealth for [facility].   The patient was identified using 2 identifiers.  Date:  10/02/2019   ID:  Gina Bradley, DOB October 04, 1960, MRN [id number]  Patient Location: Home Provider Location: Office  PCP:  Elfredia Nevins, MD  Cardiologist:  Nona Dell, MD Electrophysiologist:  Lewayne Bunting, MD   Evaluation Performed:  Follow-Up Visit  Chief Complaint:   Cardiac follow-up  History of Present Illness:    Gina Bradley is a 59 y.o. female last assessed via telehealth encounter in January by Ms. Barrett PA-C.  We spoke by phone today.  She does not report any significant palpitations over the last 6 months, no chest pain or syncope.  I reviewed her medications which are outlined below, she reports compliance and no intolerances.  Toprol-XL dose stable at 100 mg daily.  She plans to establish with [facility] now that Dr. Juanetta Gosling has retired.  I did review her ECG and echocardiogram from March, outlined below.   Past Medical History:  Diagnosis Date  . Anemia   . Cervical disc disease   . COPD (chronic obstructive pulmonary disease) (HCC)   . Diabetes mellitus without complication (HCC)   .  Essential hypertension   . GERD (gastroesophageal reflux disease)   . Heart failure (HCC) 2018  . History of cardiac catheterization    Normal coronaries March 2015  . History of non-ST elevation myocardial infarction (NSTEMI)    Secondary to SVT  . Lymphedema   . Peripheral neuropathy   . Spinal stenosis   . SVT (supraventricular tachycardia) (HCC)    Past Surgical History:  Procedure Laterality Date  . CESAREAN SECTION    . ESOPHAGOGASTRODUODENOSCOPY N/A 02/10/2014   IRJ:JOACZY tiny antral erosion; otherwise normal EGD. No explanation for patient's symptoms. Gallbladder needs further evaluation.  [name] LEFT HEART CATHETERIZATION WITH CORONARY ANGIOGRAM N/A 06/03/2013   Procedure: LEFT HEART CATHETERIZATION WITH CORONARY ANGIOGRAM;  Surgeon: Lesleigh Noe, MD;  Location: [facility] CATH LAB;  Service: Cardiovascular;  Laterality: N/A;     Current Meds  Medication Sig  . albuterol (PROVENTIL HFA;VENTOLIN HFA) 108 (90 Base) MCG/ACT inhaler Inhale into the lungs every 6 (six) hours as needed for wheezing or shortness of breath.  [name] albuterol (PROVENTIL) (2.5 MG/3ML) 0.083% nebulizer solution Take 2.5 mg by nebulization 4 (four) times daily.  [name] ELLIPTA 62.5-25 MCG/INH AEPB Inhale 1 puff into the lungs daily.   [name] aspirin EC 81 MG EC tablet Take 1 tablet (81 mg total) by mouth daily.  [name] atorvastatin (LIPITOR) 20 MG tablet Take 20 mg by mouth every evening.   [name] co-enzyme Q-10 30 MG capsule Take 100 mg by mouth daily.  [name] EPINEPHrine (EPIPEN 2-PAK) 0.3 mg/0.3 mL IJ SOAJ injection Inject 0.3  mLs (0.3 mg total) into the muscle once as needed (for severe allergic reaction). CAll 911 immediately if you have to use this medicine  . fluticasone (FLONASE) 50 MCG/ACT nasal spray Place 2 sprays into both nostrils daily.  . furosemide (LASIX) 20 MG tablet TAKE (1) TABLET BY MOUTH 2 TIMES DAILY AS NEEDED.  [name] gabapentin (NEURONTIN) 100 MG capsule Take 2 capsules (200 mg total) by mouth 3 (three) times daily.    [name] HYDROcodone-acetaminophen (NORCO) 10-325 MG tablet Take 1 tablet by mouth every 6 (six) hours as needed for severe pain. pain  . isosorbide mononitrate (ISMO) 10 MG tablet Take 10 mg by mouth daily.  [name] losartan (COZAAR) 25 MG tablet Take 25 mg by mouth daily.  . metFORMIN (GLUCOPHAGE) 500 MG tablet Take 500 mg by mouth 2 (two) times daily.   . metoprolol succinate (TOPROL-XL) 100 MG 24 hr tablet TAKE (1) TABLET BY MOUTH ONCE DAILY. (Patient taking differently: Take 100 mg by mouth daily. )  . nitroGLYCERIN (NITROSTAT) 0.4 MG SL tablet Place 1 tablet (0.4 mg total) under the tongue every 5 (five) minutes as needed for chest pain.  . Oxycodone HCl 10 MG TABS Take 10 mg by mouth 4 (four) times daily as needed.  . pantoprazole (PROTONIX) 40 MG tablet Take 40 mg by mouth 2 (two) times daily.  . predniSONE (DELTASONE) 5 MG tablet Take 10 mg by mouth 2 (two) times daily.   . primidone (MYSOLINE) 50 MG tablet Take 50 mg by mouth at bedtime.      Allergies:   Bee venom and Tdap [tetanus-diphth-acell pertussis]   ROS:   No palpitations or syncope.  Prior CV studies:   The following studies were reviewed today:  Echocardiogram 06/27/2019: 1. Left ventricular ejection fraction, by estimation, is >75%. The left  ventricle has hyperdynamic function. The left ventricle has no regional  wall motion abnormalities. There is mild left ventricular hypertrophy.  Left ventricular diastolic parameters  are indeterminate.  2. Right ventricular systolic function is normal. The right ventricular  size is normal.  3. The mitral valve is normal in structure. No evidence of mitral valve  regurgitation. No evidence of mitral stenosis.  4. The aortic valve was not well visualized. Aortic valve regurgitation  is not visualized. No aortic stenosis is present.  5. The inferior vena cava is normal in size with greater than 50%  respiratory variability, suggesting right atrial pressure of 3 mmHg.   Labs/Other  Tests and Data Reviewed:    EKG:  An ECG dated 06/26/2019 was personally reviewed today and demonstrated:  Sinus tachycardia.  Recent Labs: 06/26/2019: ALT 52; TSH 1.123 06/27/2019: B Natriuretic Peptide 111.0; Hemoglobin 13.5; Platelets 277 06/29/2019: BUN 8; Creatinine, Ser 0.62; Magnesium 1.8; Potassium 3.8; Sodium 137   Recent Lipid Panel Lab Results  Component Value Date/Time   CHOL 204 (H) 06/01/2013 04:38 AM   TRIG 60 06/01/2013 04:38 AM   HDL 76 06/01/2013 04:38 AM   CHOLHDL 2.7 06/01/2013 04:38 AM   LDLCALC 116 (H) 06/01/2013 04:38 AM    Wt Readings from Last 3 Encounters:  10/02/19 190 lb (86.2 kg)  07/04/19 189 lb 6 oz (85.9 kg)  06/24/19 200 lb (90.7 kg)     Objective:    Vital Signs:  Ht 5\' 1"  (1.549 m)   Wt 190 lb (86.2 kg)   LMP 01/05/2015   BMI 35.90 kg/m    Patient spoke in full sentences, not short of breath.  ASSESSMENT & PLAN:  1.  PSVT, doing well without significant breakthrough palpitations on Toprol-XL 100 mg daily.  She has been evaluated by Dr. Ladona Ridgel, no plan for ablation unless symptoms escalate.  2.  Essential hypertension, she continues to follow with Dr. Sherwood Gambler and remains on losartan along with her Toprol-XL.   Time:   Today, I have spent 5 minutes with the patient with telehealth technology discussing the above problems.     Medication Adjustments/Labs and Tests Ordered: Current medicines are reviewed at length with the patient today.  Concerns regarding medicines are outlined above.   Tests Ordered: No orders of the defined types were placed in this encounter.   Medication Changes: No orders of the defined types were placed in this encounter.   Follow Up:  In Person 1 year in the [place] office.  Signed, Nona Dell, MD  10/02/2019 11:36 AM    [facility]

## 2019-10-10 DIAGNOSIS — E119 Type 2 diabetes mellitus without complications: Secondary | ICD-10-CM | POA: Diagnosis not present

## 2019-10-10 DIAGNOSIS — B351 Tinea unguium: Secondary | ICD-10-CM | POA: Diagnosis not present

## 2019-10-10 DIAGNOSIS — I10 Essential (primary) hypertension: Secondary | ICD-10-CM | POA: Diagnosis not present

## 2019-10-10 DIAGNOSIS — G894 Chronic pain syndrome: Secondary | ICD-10-CM | POA: Diagnosis not present

## 2019-10-10 DIAGNOSIS — E1140 Type 2 diabetes mellitus with diabetic neuropathy, unspecified: Secondary | ICD-10-CM | POA: Diagnosis not present

## 2019-10-22 ENCOUNTER — Other Ambulatory Visit: Payer: Self-pay

## 2019-10-22 ENCOUNTER — Encounter: Payer: Self-pay | Admitting: Internal Medicine

## 2019-10-22 ENCOUNTER — Ambulatory Visit (INDEPENDENT_AMBULATORY_CARE_PROVIDER_SITE_OTHER): Payer: Medicare Other | Admitting: Internal Medicine

## 2019-10-22 DIAGNOSIS — J449 Chronic obstructive pulmonary disease, unspecified: Secondary | ICD-10-CM | POA: Diagnosis not present

## 2019-10-22 MED ORDER — PREDNISONE 5 MG PO TABS: 5.0000 mg | ORAL_TABLET | Freq: Every day | ORAL | 1 refills | Status: DC

## 2019-10-22 NOTE — Assessment & Plan Note (Addendum)
Quit smoking 01/2020 and better since. - Spirometry  12/11/15 FEV1 1.41 (61%)  Ratio 0.84 insp look markedly truncated s true plateau.  -  10/22/2019   Walked RA  approx   300 ft  @ moderate  pace  stopped due to  Leg/hip pain no sob and sats   99% -  As of 10/22/2019 rec anoro and taper pred to 2.5 mg daily   DDX of  difficult airways management almost all start with A and  include Adherence, Ace Inhibitors, Acid Reflux, Active Sinus Disease, Alpha 1 Antitripsin deficiency, Anxiety masquerading as Airways dz,  ABPA,  Allergy(esp in young), Aspiration (esp in elderly), Adverse effects of meds,  Active smoking or vaping, A bunch of PE's (a small clot burden can't cause this syndrome unless there is already severe underlying pulm or vascular dz with poor reserve) plus two Bs  = Bronchiectasis and Beta blocker use..and one C= CHF  Adherence is always the initial "prime suspect" and is a multilayered concern that requires a "trust but verify" approach in every patient - starting with knowing how to use medications, especially inhalers, correctly, keeping up with refills and understanding the fundamental difference between maintenance and prns vs those medications only taken for a very short course and then stopped and not refilled.  -  Dpi and hfa training done today though she has not had vaccine so had to keep mask on and unable to do teachback - return with all meds in hand using a trust but verify approach to confirm accurate Medication  Reconciliation The principal here is that until we are certain that the  patients are doing what we've asked, it makes no sense to ask them to do more.   ? Acid (or non-acid) GERD > always difficult to exclude as up to 75% of pts in some series report no assoc GI/ Heartburn symptoms> rec max (24h)  acid suppression and diet restrictions/ reviewed and instructions given in writing.   ? Active smoking > denies x 10 m  ? Active sinus dz > sinus ct neg 06/26/19   ?  Allergy/asthma pred dep/ saba dep with no criteria for copd on prior pft though smoked another 3 y after last study so will treat like she is at risk of exac and since can't take trelegy > anoro plus pred reasonable with goal of physiologic floor = 2.5 mg per day if tol   ? Adverse drug effect > none of the usual suspects  ? Anxiety/depression/ deconditioning > usually at the bottom of this list of usual suspects but should be much higher on this pt's based on Hx  and note already on psychotropics and may interfere with adherence and also interpretation of response or lack thereof to symptom management which can be quite subjective.   ? Active smoking > strongly denies, no cravings so low risk   ? BB effect > rec split toprol in two and take 50 mg bid rather than 100 mg in am   ? chf > echo reviewed, ok 06/27/19    >>>  The goal with a chronic steroid dependent illness is always arriving at the lowest effective dose that controls the disease/symptoms and not accepting a set "formula" which is based on statistics or guidelines that don't always take into account patient  variability or the natural hx of the dz in every individual patient, which may well vary over time.  For now therefore I recommend the patient maintain  Ceiling of  20 mg and floor of 2.5 mg daily for now  Pt informed of the seriousness of COVID 19 infection as a direct risk to lung health  and safey and to close contacts and should continue to wear a facemask in public and minimize exposure to public locations but especially avoid any area or activity where non-close contacts are not observing distancing or wearing an appropriate face mask.  I strongly recommended she take either of the vaccines ASAP available through local drugstores based on updated information on millions of Americans treated with the Moderna and Pfizer products  which have proven both safe and  effective even against the new delta variant.           Each  maintenance medication was reviewed in detail including emphasizing most importantly the difference between maintenance and prns and under what circumstances the prns are to be triggered using an action plan format where appropriate.  Total time for H and P, chart review, counseling, teaching device and generating customized AVS unique to this office visit / charting = 60 min

## 2019-10-22 NOTE — Patient Instructions (Addendum)
Increase Pantoprazole 40 mg Take 30- 60 min before your first and last meals of the day   GERD (REFLUX)  is an extremely common cause of respiratory symptoms just like yours , many times with no obvious heartburn at all.    It can be treated with medication, but also with lifestyle changes including elevation of the head of your bed (ideally with 6 -8inch blocks under the headboard of your bed),  Smoking cessation, avoidance of late meals, excessive alcohol, and avoid fatty foods, chocolate, peppermint, colas, red wine, and acidic juices such as orange juice.  NO MINT OR MENTHOL PRODUCTS SO NO COUGH DROPS  USE SUGARLESS CANDY INSTEAD (Jolley ranchers or Stover's or Life Savers) or even ice chips will also do - the key is to swallow to prevent all throat clearing. NO OIL BASED VITAMINS - use powdered substitutes.  Avoid fish oil when coughing.  Make sure you check your oxygen saturations at highest level of activity to be sure it stays over 90% and keep track of it at least once a week, more often if breathing getting worse, and let me know if losing ground.   Try wean prednisone :  One week of 10/5 then 5 mg daily x one week then 2.5 mg daily and increase if needed back to prior effective   Take the moderna or pfizer vaccines as soon as possible at [facility] or [facility]   Change toprol  to where you take a half pill twice daily   Plan A = Automatic = Always=    Anoro one click each am   Plan B = Backup (to supplement plan A, not to replace it) Only use your albuterol inhaler as a rescue medication to be used if you can't catch your breath by resting or doing a relaxed purse lip breathing pattern.  - The less you use it, the better it will work when you need it. - Ok to use the inhaler up to 2 puffs  every 4 hours if you must but call for appointment if use goes up over your usual need - Don't leave home without it !!  (think of it like the spare tire for your car)   Plan C = Crisis (instead of  Plan B but only if Plan B stops working) - only use your albuterol nebulizer if you first try Plan B and it fails to help > ok to use the nebulizer up to every 4 hours but if start needing it regularly call for immediate appointment   Plan D = Deltasone = prednisone  -  Take 2 daily until better then wean back to floor 2.5 mg    Please schedule a follow up visit in 3 months but call sooner if needed

## 2019-10-22 NOTE — Progress Notes (Signed)
Gina Bradley, Gina Bradley    DOB: 6/1[name]5/1962     MRN: [id number]   Brief patient profile:  59 yowf quit smoking 01/2019 carries dx of copd but as of 12/2015 had mostly upper airway obst pattern on pfts  Then admit:     Admit date: 06/26/2019 Discharge date: 07/04/2019  Recommendations for Outpatient Follow-up:  1. Repeat basic metabolic panel to follow electrolytes and renal function 2. Reassess blood pressure and further adjust antihypertensive regimen as needed.   Discharge Diagnoses:  Principal Problem:   AMS (altered mental status) Active Problems:   HTN (hypertension), benign   Hypercholesteremia   COPD (chronic obstructive pulmonary disease) (HCC)   Cervical stenosis of spine   Peripheral neuropathy   GERD (gastroesophageal reflux disease)   Acidosis, metabolic   Acute psychosis (HCC)   Hypokalemia   Class 1 obesity due to excess calories with body mass index (BMI) of 33.0 to 33.9 in adult   Cellulitis of lower extremity   Discharge Condition: Stable and improved.  Patient discharged home with instruction to follow-up with PCP and psychiatry service as an outpatient.  CODE STATUS: Full code.  Diet recommendation: Heart healthy diet.       Filed Weights   07/01/19 0500 07/02/19 0639 07/04/19 0700  Weight: 82.5 kg 83.8 kg 85.9 kg    History of present illness:  As per H&P written byDr. Teodoro KilGadhia on 06/27/19 58 y.o.femalewith medical history significant ofCOPD, hypertension, hyperlipidemia, peripheral neuropathy who presented to the ER with altered mental status. Patient presented with increased confusion, hallucinations, psychotic behavior. Patient was brought in yelling and screaming and saying that she is hearing voices. At the time of my interview she refers to herself in the third person and was referring to all her symptoms as if they were affecting somebody else. Also seem to have some delusions, confusion and hallucinations. Possibly acute  psychosis with schizophrenia. Does not seem to have a prior history of schizophrenia but admits to me that she has been diagnosed with "schizoid". Patient does not really give a relevant history at this time. ED Course: Vital Signs reviewed on presentation, significant fortemperature 98.4, heart rate 106, blood pressure 109/75, saturation 100% on room air. Labs reviewed, significant forarterial blood gas shows a pH of 7.43, PCO2 24, PO2 102, saturation 97%. Sodium 136, potassium 3.8, bicarb 16, anion gap 20, BUN 11, creatinine 0.59, ALT 52, total bilirubin 1.4, ammonia 34, lactic acid 1.0, WBC count 14.3, hemoglobin 15, hematocrit 46, platelets 272, TSH 1.123. Imaging personally Reviewed,chest x-ray shows no acute cardiopulmonary disease. CT of the head shows no acute intracranial abnormalities. EKG personally reviewed, showssinus tachycardia, no acute ST-T changes.  Hospital Course:  1-acute hepatic encephalopathy: Presented with increased confusion, hallucinations and psychotic behavior. -Following psychiatry recommendations patient has been started on Zyprexa twice a day (2.5 mg in the morning and 5 mg at bedtime), in order to achieve mood stabilization and prevent psychosis. -Has been cleared by psychiatry service to go home and follow-up as an outpatient -Currently hemodynamically stable, denying hallucinations or suicidal ideation. -She is oriented x3 and demonstrating normal insight.  2-hypokalemia -Potassium 2.8 on presentation -After repletion/initiation of maintenance supplementation potassium within normal limits -Magnesium also stable. -Repeat basic metabolic panel follow-up visit to reassess electrolytes trend and the stability.  3-hypertension -Continue metoprolol and heart healthy diet.[name].  4-hyperlipidemia -Continue Lipitor.  5-bilateral lower extremity cellulitis/left lower extremity open wound -No signs of active drainage -Significant improvement in  erythematous changes -No fever and  normal WBCs -Continue treatment with oral doxycycline to complete antibiotic therapy. -Patient advised to maintain low-sodium diet and to keep her legs elevated.   6-chronic diastolic congestive heart failure: -Compensated overall -Continue blood pressure control, continue treatment with isosorbide and as needed home Lasix dosage. -Heart healthy diet and daily weight has been instructed.  7-peripheral neuropathy -Continue gabapentin -Dose will be adjusted for further control of her neuropathic pain. -Instructed to take 200 mg 3 times a day at discharge; recommending close monitoring by PCP with further adjustment to medication as needed.  8-chronic obstructive pulmonary disease -Stable and compensated -No wheezing on exam. -No requiring oxygen supplementation and no wheezing on exam. -Continue as needed albuterol and continue Anoro Ellipta.  9-gastroesophageal reflux disease -Continue PPI.  10-class I obesity -Low calorie diet, portion control increase physical activity discussed with patient -Body mass index is 33.55 kg/m.       History of Present Illness  10/22/2019  Pulmonary/ 1st office eval/Joncarlo Friberg  Quit smoking 01/2019/ maint on anoro and pred 10 mg daily x "2 y"   Chief Complaint  Patient presents with  . Consult    Patient has COPD. Patient is doing better than last summer. When it is really hot or really cold it makes her breathing worse. Dry cough except in the morning with grey sputum. Shortness of breath with exeriton.  Dyspnea:  mb and back 100-150 ft stops a time or two  Pain in back/hips/ knees also limiting  Cough: not much at all  Sleep: flat bed two pillows, needs "inhalers first thing in am" with assoc neck tightness on ppi q am only  SABA use: once or twice daily depending on activity - never  Re-challenges or prechallenges  No obvious day to day or daytime variability or assoc excess/ purulent sputum or mucus plugs  or hemoptysis or cp or chest tightness, subjective wheeze or overt sinus or hb symptoms.     Also denies any obvious fluctuation of symptoms with weather or environmental changes or other aggravating or alleviating factors except as outlined above   No unusual exposure hx or h/o childhood pna/ asthma or knowledge of premature birth.  Current Allergies, Complete Past Medical History, Past Surgical History, Family History, and Social History were reviewed in [employer] record.  ROS  The following are not active complaints unless bolded Hoarseness, sore throat, dysphagia/ globus sensation , dental problems, itching, sneezing,  nasal congestion or discharge of excess mucus or purulent secretions, ear ache,   fever, chills, sweats, unintended wt loss or wt gain, classically pleuritic or exertional cp,  orthopnea pnd or arm/hand swelling  or leg swelling, presyncope, palpitations, abdominal pain, anorexia, nausea, vomiting, diarrhea  or change in bowel habits or change in bladder habits, change in stools or change in urine, dysuria, hematuria,  rash, arthralgias, visual complaints, headache, numbness, weakness or ataxia or problems with walking or coordination,  change in mood or  memory.            Past Medical History:  Diagnosis Date  . Anemia   . Cervical disc disease   . COPD (chronic obstructive pulmonary disease) (HCC)   . Diabetes mellitus without complication (HCC)   . Essential hypertension   . GERD (gastroesophageal reflux disease)   . Heart failure (HCC) 2018  . History of cardiac catheterization    Normal coronaries March 2015  . History of non-ST elevation myocardial infarction (NSTEMI)    Secondary to SVT  . Lymphedema   .  Peripheral neuropathy   . Spinal stenosis   . SVT (supraventricular tachycardia) (HCC)     Outpatient Medications Prior to Visit  Medication Sig Dispense Refill  . albuterol (PROVENTIL HFA;VENTOLIN HFA) 108 (90 Base) MCG/ACT  inhaler Inhale into the lungs every 6 (six) hours as needed for wheezing or shortness of breath.    [name] albuterol (PROVENTIL) (2.5 MG/3ML) 0.083% nebulizer solution Take 2.5 mg by nebulization 4 (four) times daily.    [name] ELLIPTA 62.5-25 MCG/INH AEPB Inhale 1 puff into the lungs daily.   11  . aspirin EC 81 MG EC tablet Take 1 tablet (81 mg total) by mouth daily.    [name] atorvastatin (LIPITOR) 20 MG tablet Take 20 mg by mouth every evening.     [name] co-enzyme Q-10 30 MG capsule Take 100 mg by mouth daily.    [name] EPINEPHrine (EPIPEN 2-PAK) 0.3 mg/0.3 mL IJ SOAJ injection Inject 0.3 mLs (0.3 mg total) into the muscle once as needed (for severe allergic reaction). CAll 911 immediately if you have to use this medicine 2 Device 1  . fluticasone (FLONASE) 50 MCG/ACT nasal spray Place 2 sprays into both nostrils daily.    . furosemide (LASIX) 20 MG tablet TAKE (1) TABLET BY MOUTH 2 TIMES DAILY AS NEEDED. 60 tablet 6  . gabapentin (NEURONTIN) 100 MG capsule Take 2 capsules (200 mg total) by mouth 3 (three) times daily.    [name] HYDROcodone-acetaminophen (NORCO) 10-325 MG tablet Take 1 tablet by mouth every 6 (six) hours as needed for severe pain. pain    . isosorbide mononitrate (ISMO) 10 MG tablet Take 10 mg by mouth daily.    [name] losartan (COZAAR) 25 MG tablet Take 25 mg by mouth daily.    . metFORMIN (GLUCOPHAGE) 500 MG tablet Take 500 mg by mouth 2 (two) times daily.   10  . metoprolol succinate (TOPROL-XL) 100 MG 24 hr tablet TAKE (1) TABLET BY MOUTH ONCE DAILY. (Patient taking differently: Take 100 mg by mouth daily. ) 30 tablet 9  . nitroGLYCERIN (NITROSTAT) 0.4 MG SL tablet Place 1 tablet (0.4 mg total) under the tongue every 5 (five) minutes as needed for chest pain. 10 tablet 0  . Oxycodone HCl 10 MG TABS Take 10 mg by mouth 4 (four) times daily as needed.    . pantoprazole (PROTONIX) 40 MG tablet Take 40 mg by mouth 2 (two) times daily.    . predniSONE (DELTASONE) 5 MG tablet Take 10 mg by mouth 2 (two)  times daily.     . primidone (MYSOLINE) 50 MG tablet Take 50 mg by mouth at bedtime.      No facility-administered medications prior to visit.     Objective:     BP 128/78 (BP Location: Left Arm, Patient Position: Sitting, Cuff Size: Large)   Pulse 84   Temp 99 F (37.2 C) (Oral)   Ht  (1.549 m)   Wt 188 lb (85.3 kg)   LMP 01/05/2015   SpO2 95% Comment: on room air  BMI 35.52 kg/m   SpO2: 95 % (on room air)  Obese slt hoarse wf nad    HEENT : pt wearing mask not removed for exam due to covid -19 concerns.    NECK :  without JVD/Nodes/TM/ nl carotid upstrokes bilaterally   LUNGS: no acc muscle use,  Nl contour chest which is clear to A and P bilaterally without cough on insp or exp maneuvers   CV:  RRR  no s3  or murmur or increase in P2, and no edema   ABD:  Quite obese soft and nontender with limited inspiratory excursion in the supine position. No bruits or organomegaly appreciated, bowel sounds nl  MS:  Nl gait/ ext warm without deformities, calf tenderness, cyanosis or clubbing No obvious joint restrictions   SKIN: warm and dry without lesions    NEURO:  alert, approp, nl sensorium with  no motor or cerebellar deficits apparent.     I personally reviewed images and agree with radiology impression as follows:  CXR:  Portable 06/26/19  No active disease.     Assessment   COPD  GOLD 0/ atypical insp portion of f/v curve  Quit smoking 01/2020 and better since. - Spirometry  12/11/15 FEV1 1.41 (61%)  Ratio 0.84 insp look markedly truncated s true plateau.  -  10/22/2019   Walked RA  approx   300 ft  @ moderate  pace  stopped due to  Leg/hip pain no sob and sats   99% -  As of 10/22/2019 rec anoro and taper pred to 2.5 mg daily   DDX of  difficult airways management almost all start with A and  include Adherence, Ace Inhibitors, Acid Reflux, Active Sinus Disease, Alpha 1 Antitripsin deficiency, Anxiety masquerading as Airways dz,  ABPA,  Allergy(esp in young),  Aspiration (esp in elderly), Adverse effects of meds,  Active smoking or vaping, A bunch of PE's (a small clot burden can't cause this syndrome unless there is already severe underlying pulm or vascular dz with poor reserve) plus two Bs  = Bronchiectasis and Beta blocker use..and one C= CHF  Adherence is always the initial "prime suspect" and is a multilayered concern that requires a "trust but verify" approach in every patient - starting with knowing how to use medications, especially inhalers, correctly, keeping up with refills and understanding the fundamental difference between maintenance and prns vs those medications only taken for a very short course and then stopped and not refilled.  -  Dpi and hfa training done today though she has not had vaccine so had to keep mask on and unable to do teachback - return with all meds in hand using a trust but verify approach to confirm accurate Medication  Reconciliation The principal here is that until we are certain that the  patients are doing what we've asked, it makes no sense to ask them to do more.   ? Acid (or non-acid) GERD > always difficult to exclude as up to 75% of pts in some series report no assoc GI/ Heartburn symptoms> rec max (24h)  acid suppression and diet restrictions/ reviewed and instructions given in writing.   ? Active smoking > denies x 10 m  ? Active sinus dz > sinus ct neg 06/26/19   ? Allergy/asthma pred dep/ saba dep with no criteria for copd on prior pft though smoked another 3 y after last study so will treat like she is at risk of exac and since can't take trelegy > anoro plus pred reasonable with goal of physiologic floor = 2.5 mg per day if tol   ? Adverse drug effect > none of the usual suspects  ? Anxiety/depression/ deconditioning > usually at the bottom of this list of usual suspects but should be much higher on this pt's based on Hx  and note already on psychotropics and may interfere with adherence and also  interpretation of response or lack thereof to symptom management which can be quite subjective.   ?  Active smoking > strongly denies, no cravings so low risk   ? BB effect > rec split toprol in two and take 50 mg bid rather than 100 mg in am   ? chf > echo reviewed, ok 06/27/19    >>>  The goal with a chronic steroid dependent illness is always arriving at the lowest effective dose that controls the disease/symptoms and not accepting a set "formula" which is based on statistics or guidelines that don't always take into account patient  variability or the natural hx of the dz in every individual patient, which may well vary over time.  For now therefore I recommend the patient maintain  Ceiling of 20 mg and floor of 2.5 mg daily for now  Pt informed of the seriousness of COVID 19 infection as a direct risk to lung health  and safey and to close contacts and should continue to wear a facemask in public and minimize exposure to public locations but especially avoid any area or activity where non-close contacts are not observing distancing or wearing an appropriate face mask.  I strongly recommended she take either of the vaccines ASAP available through local drugstores based on updated information on millions of Americans treated with the Moderna and Pfizer products  which have proven both safe and  effective even against the new delta variant.           Each maintenance medication was reviewed in detail including emphasizing most importantly the difference between maintenance and prns and under what circumstances the prns are to be triggered using an action plan format where appropriate.  Total time for H and P, chart review, counseling, teaching device and generating customized AVS unique to this office visit / charting = 60 min            Sandrea Hughs, MD 10/22/2019

## 2019-11-01 DIAGNOSIS — E1140 Type 2 diabetes mellitus with diabetic neuropathy, unspecified: Secondary | ICD-10-CM | POA: Diagnosis not present

## 2019-11-01 DIAGNOSIS — I10 Essential (primary) hypertension: Secondary | ICD-10-CM | POA: Diagnosis not present

## 2019-11-01 DIAGNOSIS — J449 Chronic obstructive pulmonary disease, unspecified: Secondary | ICD-10-CM | POA: Diagnosis not present

## 2019-11-01 DIAGNOSIS — Z720 Tobacco use: Secondary | ICD-10-CM | POA: Diagnosis not present

## 2019-11-20 ENCOUNTER — Ambulatory Visit: Payer: Medicare Other | Admitting: Internal Medicine

## 2019-11-20 DIAGNOSIS — J449 Chronic obstructive pulmonary disease, unspecified: Secondary | ICD-10-CM | POA: Diagnosis not present

## 2019-11-20 DIAGNOSIS — G8929 Other chronic pain: Secondary | ICD-10-CM | POA: Diagnosis not present

## 2019-11-20 DIAGNOSIS — M47816 Spondylosis without myelopathy or radiculopathy, lumbar region: Secondary | ICD-10-CM | POA: Diagnosis not present

## 2019-12-03 DIAGNOSIS — Z720 Tobacco use: Secondary | ICD-10-CM | POA: Diagnosis not present

## 2019-12-03 DIAGNOSIS — J449 Chronic obstructive pulmonary disease, unspecified: Secondary | ICD-10-CM | POA: Diagnosis not present

## 2019-12-03 DIAGNOSIS — I10 Essential (primary) hypertension: Secondary | ICD-10-CM | POA: Diagnosis not present

## 2019-12-03 DIAGNOSIS — E1140 Type 2 diabetes mellitus with diabetic neuropathy, unspecified: Secondary | ICD-10-CM | POA: Diagnosis not present

## 2019-12-19 DIAGNOSIS — I10 Essential (primary) hypertension: Secondary | ICD-10-CM | POA: Diagnosis not present

## 2019-12-19 DIAGNOSIS — G894 Chronic pain syndrome: Secondary | ICD-10-CM | POA: Diagnosis not present

## 2019-12-19 DIAGNOSIS — E1140 Type 2 diabetes mellitus with diabetic neuropathy, unspecified: Secondary | ICD-10-CM | POA: Diagnosis not present

## 2019-12-19 DIAGNOSIS — E7849 Other hyperlipidemia: Secondary | ICD-10-CM | POA: Diagnosis not present

## 2019-12-20 ENCOUNTER — Other Ambulatory Visit (HOSPITAL_COMMUNITY): Payer: Self-pay | Admitting: Internal Medicine

## 2019-12-20 DIAGNOSIS — Z1231 Encounter for screening mammogram for malignant neoplasm of breast: Secondary | ICD-10-CM

## 2020-01-02 DIAGNOSIS — Z720 Tobacco use: Secondary | ICD-10-CM | POA: Diagnosis not present

## 2020-01-02 DIAGNOSIS — I10 Essential (primary) hypertension: Secondary | ICD-10-CM | POA: Diagnosis not present

## 2020-01-02 DIAGNOSIS — J449 Chronic obstructive pulmonary disease, unspecified: Secondary | ICD-10-CM | POA: Diagnosis not present

## 2020-01-02 DIAGNOSIS — E781 Pure hyperglyceridemia: Secondary | ICD-10-CM | POA: Diagnosis not present

## 2020-01-21 DIAGNOSIS — G894 Chronic pain syndrome: Secondary | ICD-10-CM | POA: Diagnosis not present

## 2020-01-21 DIAGNOSIS — G64 Other disorders of peripheral nervous system: Secondary | ICD-10-CM | POA: Diagnosis not present

## 2020-01-21 DIAGNOSIS — J449 Chronic obstructive pulmonary disease, unspecified: Secondary | ICD-10-CM | POA: Diagnosis not present

## 2020-01-21 DIAGNOSIS — M779 Enthesopathy, unspecified: Secondary | ICD-10-CM | POA: Diagnosis not present

## 2020-01-22 ENCOUNTER — Ambulatory Visit: Payer: Medicare Other | Admitting: Internal Medicine

## 2020-01-23 ENCOUNTER — Ambulatory Visit: Payer: Medicare Other | Admitting: Internal Medicine

## 2020-02-01 DIAGNOSIS — J449 Chronic obstructive pulmonary disease, unspecified: Secondary | ICD-10-CM | POA: Diagnosis not present

## 2020-02-01 DIAGNOSIS — Z720 Tobacco use: Secondary | ICD-10-CM | POA: Diagnosis not present

## 2020-02-01 DIAGNOSIS — E781 Pure hyperglyceridemia: Secondary | ICD-10-CM | POA: Diagnosis not present

## 2020-02-01 DIAGNOSIS — I10 Essential (primary) hypertension: Secondary | ICD-10-CM | POA: Diagnosis not present

## 2020-02-06 ENCOUNTER — Encounter: Payer: Self-pay | Admitting: Internal Medicine

## 2020-02-06 ENCOUNTER — Ambulatory Visit (INDEPENDENT_AMBULATORY_CARE_PROVIDER_SITE_OTHER): Payer: Medicare Other | Admitting: Internal Medicine

## 2020-02-06 ENCOUNTER — Other Ambulatory Visit: Payer: Self-pay

## 2020-02-06 VITALS — BP 132/84 | HR 80 | Ht 61.0 in | Wt 205.3 lb

## 2020-02-06 DIAGNOSIS — I471 Supraventricular tachycardia: Secondary | ICD-10-CM

## 2020-02-06 DIAGNOSIS — I10 Essential (primary) hypertension: Secondary | ICD-10-CM | POA: Diagnosis not present

## 2020-02-06 NOTE — Progress Notes (Signed)
HPI Gina Bradley returns today for followup. She is a pleasant 59 yo woman with a h/o obesity, SVT, and COPD who returns today for ongoing followup of her SVT. In the interim, she has not lost any weight. Her palpitations have been well controlled on toprol. She lost weight but then gained it back. Allergies  Allergen Reactions  . Bee Venom Other (See Comments)    Unknown  . Tdap [Tetanus-Diphth-Acell Pertussis] Other (See Comments)    Unknown     Current Outpatient Medications  Medication Sig Dispense Refill  . albuterol (PROVENTIL HFA;VENTOLIN HFA) 108 (90 Base) MCG/ACT inhaler Inhale into the lungs every 6 (six) hours as needed for wheezing or shortness of breath.    [name] albuterol (PROVENTIL) (2.5 MG/3ML) 0.083% nebulizer solution Take 2.5 mg by nebulization 4 (four) times daily.    Gina Bradley ELLIPTA 62.5-25 MCG/INH AEPB Inhale 1 puff into the lungs daily.   11  . aspirin EC 81 MG EC tablet Take 1 tablet (81 mg total) by mouth daily.    [name] atorvastatin (LIPITOR) 20 MG tablet Take 20 mg by mouth every evening.     [name] co-enzyme Q-10 30 MG capsule Take 100 mg by mouth daily.    [name] EPINEPHrine (EPIPEN 2-PAK) 0.3 mg/0.3 mL IJ SOAJ injection Inject 0.3 mLs (0.3 mg total) into the muscle once as needed (for severe allergic reaction). CAll 911 immediately if you have to use this medicine 2 Device 1  . fluticasone (FLONASE) 50 MCG/ACT nasal spray Place 2 sprays into both nostrils daily.    . furosemide (LASIX) 20 MG tablet TAKE (1) TABLET BY MOUTH 2 TIMES DAILY AS NEEDED. 60 tablet 6  . gabapentin (NEURONTIN) 100 MG capsule Take 2 capsules (200 mg total) by mouth 3 (three) times daily.    [name] HYDROcodone-acetaminophen (NORCO) 10-325 MG tablet Take 1 tablet by mouth every 6 (six) hours as needed for severe pain. pain    . isosorbide mononitrate (ISMO) 10 MG tablet Take 10 mg by mouth daily.    [name] losartan (COZAAR) 25 MG tablet Take 25 mg by mouth daily.    . metFORMIN (GLUCOPHAGE) 500 MG tablet Take  500 mg by mouth 2 (two) times daily.   10  . metoprolol succinate (TOPROL-XL) 100 MG 24 hr tablet TAKE (1) TABLET BY MOUTH ONCE DAILY. (Patient taking differently: Take 100 mg by mouth daily. ) 30 tablet 9  . nitroGLYCERIN (NITROSTAT) 0.4 MG SL tablet Place 1 tablet (0.4 mg total) under the tongue every 5 (five) minutes as needed for chest pain. 10 tablet 0  . Oxycodone HCl 10 MG TABS Take 10 mg by mouth 4 (four) times daily as needed.    . pantoprazole (PROTONIX) 40 MG tablet Take 40 mg by mouth 2 (two) times daily.    . predniSONE (DELTASONE) 5 MG tablet Take 1 tablet (5 mg total) by mouth daily with breakfast. 100 tablet 1  . primidone (MYSOLINE) 50 MG tablet Take 50 mg by mouth at bedtime.      No current facility-administered medications for this visit.     Past Medical History:  Diagnosis Date  . Anemia   . Cervical disc disease   . COPD (chronic obstructive pulmonary disease) (HCC)   . Diabetes mellitus without complication (HCC)   . Essential hypertension   . GERD (gastroesophageal reflux disease)   . Heart failure (HCC) 2018  . History of cardiac catheterization    Normal coronaries March 2015  .  History of non-ST elevation myocardial infarction (NSTEMI)    Secondary to SVT  . Lymphedema   . Peripheral neuropathy   . Spinal stenosis   . SVT (supraventricular tachycardia) (HCC)     ROS:   All systems reviewed and negative except as noted in the HPI.   Past Surgical History:  Procedure Laterality Date  . CESAREAN SECTION    . ESOPHAGOGASTRODUODENOSCOPY N/A 02/10/2014   NKN:[id number] tiny antral erosion; otherwise normal EGD. No explanation for patient's symptoms. Gallbladder needs further evaluation.  [name] LEFT HEART CATHETERIZATION WITH CORONARY ANGIOGRAM N/A 06/03/2013   Procedure: LEFT HEART CATHETERIZATION WITH CORONARY ANGIOGRAM;  Surgeon: Lesleigh Noe, MD;  Location: [facility] CATH LAB;  Service: Cardiovascular;  Laterality: N/A;     Family History  Problem Relation  Age of Onset  . Heart attack Mother   . Diabetes Mother   . Hypertension Mother   . Angina Mother   . Diabetes Father   . Hypertension Father   . Heart failure Brother   . Suicidality Sister   . Colon cancer Other        Possibly Dad   . Stroke Maternal Grandmother   . Heart failure Maternal Grandmother   . Diabetes Maternal Grandfather   . Hypertension Daughter   . Hypertension Son      Social History   Socioeconomic History  . Marital status: Married    Spouse name: Not on file  . Number of children: Not on file  . Years of education: Not on file  . Highest education level: Not on file  Occupational History  . Occupation: [employer]    Employer: [employer]  Tobacco Use  . Smoking status: Former Smoker    Packs/day: 1.00    Years: 18.00    Pack years: 18.00    Types: Cigarettes    Start date: 09/17/2003    Quit date: 02/01/2019    Years since quitting: 1.0  . Smokeless tobacco: Never Used  Vaping Use  . Vaping Use: Never used  Substance and Sexual Activity  . Alcohol use: No    Alcohol/week: 0.0 standard drinks  . Drug use: Yes    Types: Marijuana  . Sexual activity: Not Currently    Birth control/protection: Post-menopausal  Other Topics Concern  . Not on file  Social History Narrative  . Not on file   Social Determinants of Health   Financial Resource Strain:   . Difficulty of Paying Living Expenses: Not on file  Food Insecurity:   . Worried About Programme researcher, broadcasting/film/video in the Last Year: Not on file  . Ran Out of Food in the Last Year: Not on file  Transportation Needs:   . Lack of Transportation (Medical): Not on file  . Lack of Transportation (Non-Medical): Not on file  Physical Activity:   . Days of Exercise per Week: Not on file  . Minutes of Exercise per Session: Not on file  Stress:   . Feeling of Stress : Not on file  Social Connections:   . Frequency of Communication with Friends and Family: Not on file  . Frequency of Social Gatherings  with Friends and Family: Not on file  . Attends Religious Services: Not on file  . Active Member of Clubs or Organizations: Not on file  . Attends Banker Meetings: Not on file  . Marital Status: Not on file  Intimate Partner Violence:   . Fear of Current or Ex-Partner: Not on file  .  Emotionally Abused: Not on file  . Physically Abused: Not on file  . Sexually Abused: Not on file     BP 132/84   Pulse 84   Ht 5\' 1"  (1.549 m)   Wt 205 lb 4.8 oz (93.1 kg)   LMP 01/05/2015   SpO2 98%   BMI 38.79 kg/m   Physical Exam:  obese appearing NAD HEENT: Unremarkable Neck:  6 cm JVD, no thyromegally Lymphatics:  No adenopathy Back:  No CVA tenderness Lungs:  Clear with no wheezes HEART:  Regular rate rhythm, no murmurs, no rubs, no clicks Abd:  soft, positive bowel sounds, no organomegally, no rebound, no guarding Ext:  2 plus pulses, no edema, no cyanosis, no clubbing Skin:  No rashes no nodules Neuro:  CN II through XII intact, motor grossly intact  EKG - nsr  Assess/Plan: 1. SVT - she has been well controlled since her last visit. She will continue toprol. 2. HTN - her bp is well controlled. We will follow. 3. Obesity - I discussed weight loss as well as intermittent fasting.  4. COPD - she will continue her bronchodilators.   03/07/2015 Shela Esses,MD

## 2020-02-06 NOTE — Patient Instructions (Signed)
Medication Instructions:  Your physician recommends that you continue on your current medications as directed. Please refer to the Current Medication list given to you today.  *If you need a refill on your cardiac medications before your next appointment, please call your pharmacy*   Lab Work: NONE   If you have labs (blood work) drawn today and your tests are completely normal, you will receive your results only by: . MyChart Message (if you have MyChart) OR . A paper copy in the mail If you have any lab test that is abnormal or we need to change your treatment, we will call you to review the results.   Testing/Procedures: NONE    Follow-Up: At [facility], you and your health needs are our priority.  As part of our continuing mission to provide you with exceptional heart care, we have created designated Provider Care Teams.  These Care Teams include your primary Cardiologist (physician) and Advanced Practice Providers (APPs -  Physician Assistants and Nurse Practitioners) who all work together to provide you with the care you need, when you need it.  We recommend signing up for the patient portal called "MyChart".  Sign up information is provided on this After Visit Summary.  MyChart is used to connect with patients for Virtual Visits (Telemedicine).  Patients are able to view lab/test results, encounter notes, upcoming appointments, etc.  Non-urgent messages can be sent to your provider as well.   To learn more about what you can do with MyChart, go to [email or web address]://[email or web address].com.    Your next appointment:   1 year(s)  The format for your next appointment:   In Person  Provider:   Gregg Taylor, MD   Other Instructions Thank you for choosing [facility]!    

## 2020-02-14 NOTE — Addendum Note (Signed)
Addended by: Marlyn Corporal A on: 02/14/2020 10:39 AM   Modules accepted: Orders

## 2020-03-03 DIAGNOSIS — E781 Pure hyperglyceridemia: Secondary | ICD-10-CM | POA: Diagnosis not present

## 2020-03-03 DIAGNOSIS — Z720 Tobacco use: Secondary | ICD-10-CM | POA: Diagnosis not present

## 2020-03-03 DIAGNOSIS — J449 Chronic obstructive pulmonary disease, unspecified: Secondary | ICD-10-CM | POA: Diagnosis not present

## 2020-03-03 DIAGNOSIS — I10 Essential (primary) hypertension: Secondary | ICD-10-CM | POA: Diagnosis not present

## 2020-03-05 ENCOUNTER — Other Ambulatory Visit (HOSPITAL_COMMUNITY): Payer: Self-pay | Admitting: Family Medicine

## 2020-03-05 ENCOUNTER — Ambulatory Visit (HOSPITAL_COMMUNITY)
Admission: RE | Admit: 2020-03-05 | Discharge: 2020-03-05 | Disposition: A | Discharge status: Home / Self Care | Payer: Medicare Other | Source: Ambulatory Visit | Attending: Family Medicine | Admitting: Family Medicine

## 2020-03-05 ENCOUNTER — Other Ambulatory Visit: Payer: Self-pay

## 2020-03-05 ENCOUNTER — Ambulatory Visit: Payer: Medicare Other

## 2020-03-05 ENCOUNTER — Other Ambulatory Visit: Payer: Self-pay | Admitting: Family Medicine

## 2020-03-05 DIAGNOSIS — Z87442 Personal history of urinary calculi: Secondary | ICD-10-CM | POA: Diagnosis not present

## 2020-03-05 DIAGNOSIS — N200 Calculus of kidney: Secondary | ICD-10-CM | POA: Insufficient documentation

## 2020-03-05 DIAGNOSIS — N281 Cyst of kidney, acquired: Secondary | ICD-10-CM | POA: Diagnosis not present

## 2020-03-05 DIAGNOSIS — J449 Chronic obstructive pulmonary disease, unspecified: Secondary | ICD-10-CM | POA: Diagnosis not present

## 2020-03-05 DIAGNOSIS — G64 Other disorders of peripheral nervous system: Secondary | ICD-10-CM | POA: Diagnosis not present

## 2020-03-05 DIAGNOSIS — L089 Local infection of the skin and subcutaneous tissue, unspecified: Secondary | ICD-10-CM | POA: Diagnosis not present

## 2020-03-10 ENCOUNTER — Ambulatory Visit (HOSPITAL_COMMUNITY): Payer: Medicare Other

## 2020-04-02 DIAGNOSIS — N390 Urinary tract infection, site not specified: Secondary | ICD-10-CM | POA: Diagnosis not present

## 2020-04-02 DIAGNOSIS — R319 Hematuria, unspecified: Secondary | ICD-10-CM | POA: Diagnosis not present

## 2020-04-02 DIAGNOSIS — G894 Chronic pain syndrome: Secondary | ICD-10-CM | POA: Diagnosis not present

## 2020-04-02 DIAGNOSIS — L03119 Cellulitis of unspecified part of limb: Secondary | ICD-10-CM | POA: Diagnosis not present

## 2020-04-02 DIAGNOSIS — E119 Type 2 diabetes mellitus without complications: Secondary | ICD-10-CM | POA: Diagnosis not present

## 2020-04-02 DIAGNOSIS — J449 Chronic obstructive pulmonary disease, unspecified: Secondary | ICD-10-CM | POA: Diagnosis not present

## 2020-04-03 DIAGNOSIS — I10 Essential (primary) hypertension: Secondary | ICD-10-CM | POA: Diagnosis not present

## 2020-04-03 DIAGNOSIS — Z720 Tobacco use: Secondary | ICD-10-CM | POA: Diagnosis not present

## 2020-04-03 DIAGNOSIS — J449 Chronic obstructive pulmonary disease, unspecified: Secondary | ICD-10-CM | POA: Diagnosis not present

## 2020-04-03 DIAGNOSIS — E781 Pure hyperglyceridemia: Secondary | ICD-10-CM | POA: Diagnosis not present

## 2020-05-02 DIAGNOSIS — I10 Essential (primary) hypertension: Secondary | ICD-10-CM | POA: Diagnosis not present

## 2020-05-02 DIAGNOSIS — Z720 Tobacco use: Secondary | ICD-10-CM | POA: Diagnosis not present

## 2020-05-02 DIAGNOSIS — J449 Chronic obstructive pulmonary disease, unspecified: Secondary | ICD-10-CM | POA: Diagnosis not present

## 2020-05-02 DIAGNOSIS — E781 Pure hyperglyceridemia: Secondary | ICD-10-CM | POA: Diagnosis not present

## 2020-05-04 DIAGNOSIS — E7849 Other hyperlipidemia: Secondary | ICD-10-CM | POA: Diagnosis not present

## 2020-05-04 DIAGNOSIS — G8929 Other chronic pain: Secondary | ICD-10-CM | POA: Diagnosis not present

## 2020-05-04 DIAGNOSIS — Z0001 Encounter for general adult medical examination with abnormal findings: Secondary | ICD-10-CM | POA: Diagnosis not present

## 2020-05-18 ENCOUNTER — Ambulatory Visit: Payer: Medicare Other | Admitting: Urology

## 2020-05-25 ENCOUNTER — Encounter: Payer: Self-pay | Admitting: Internal Medicine

## 2020-06-01 DIAGNOSIS — I10 Essential (primary) hypertension: Secondary | ICD-10-CM | POA: Diagnosis not present

## 2020-06-01 DIAGNOSIS — J449 Chronic obstructive pulmonary disease, unspecified: Secondary | ICD-10-CM | POA: Diagnosis not present

## 2020-06-01 DIAGNOSIS — Z720 Tobacco use: Secondary | ICD-10-CM | POA: Diagnosis not present

## 2020-06-01 DIAGNOSIS — E1140 Type 2 diabetes mellitus with diabetic neuropathy, unspecified: Secondary | ICD-10-CM | POA: Diagnosis not present

## 2020-06-01 DIAGNOSIS — E781 Pure hyperglyceridemia: Secondary | ICD-10-CM | POA: Diagnosis not present

## 2020-06-03 DIAGNOSIS — E1140 Type 2 diabetes mellitus with diabetic neuropathy, unspecified: Secondary | ICD-10-CM | POA: Diagnosis not present

## 2020-06-03 DIAGNOSIS — J449 Chronic obstructive pulmonary disease, unspecified: Secondary | ICD-10-CM | POA: Diagnosis not present

## 2020-06-03 DIAGNOSIS — R609 Edema, unspecified: Secondary | ICD-10-CM | POA: Diagnosis not present

## 2020-06-04 ENCOUNTER — Encounter: Payer: Self-pay | Admitting: Gastroenterology

## 2020-06-26 ENCOUNTER — Telehealth: Payer: Self-pay | Admitting: Cardiology

## 2020-06-26 DIAGNOSIS — J449 Chronic obstructive pulmonary disease, unspecified: Secondary | ICD-10-CM | POA: Diagnosis not present

## 2020-06-26 DIAGNOSIS — G894 Chronic pain syndrome: Secondary | ICD-10-CM | POA: Diagnosis not present

## 2020-06-26 NOTE — Telephone Encounter (Signed)
Contacted patient with no answer. Left message to call our office back.

## 2020-06-26 NOTE — Telephone Encounter (Signed)
New message    Pt c/o swelling: STAT is pt has developed SOB within 24 hours  1) How much weight have you gained and in what time span? Not sure was 211 went up to 217  2) If swelling, where is the swelling located?  All over -stomach face and chest  3) Are you currently taking a fluid pill? Yes , but she is having blood in urine   4) Are you currently SOB? yes  5) Do you have a log of your daily weights (if so, list)? no  6) Have you gained 3 pounds in a day or 5 pounds in a week? Not   7) Have you traveled recently? no

## 2020-06-29 ENCOUNTER — Ambulatory Visit: Payer: Medicare Other | Admitting: Physician Assistant

## 2020-06-29 NOTE — Telephone Encounter (Signed)
Patient has an appointment with Leda Gauze PA-C on 06/29/20 at 11:30 am to discuss symptoms.

## 2020-06-29 NOTE — Progress Notes (Deleted)
Cardiology Office Note    Date:  06/29/2020   ID:  Gina Bradley, DOB Oct 20, 1960, MRN [id number]   PCP:  Elfredia Nevins, MD    [facility]  Cardiologist:  Nona Dell, MD *** Advanced Practice Provider:  No care team member to display Electrophysiologist:  Lewayne Bunting, MD   [phone]   No chief complaint on file.   History of Present Illness:  Gina Bradley is a 60 y.o. female with history of SVT maintained on toprol, HTN, Obesity, COPD  Last saw Dr. Diona Browner 06/2019 and doing well. Dr. Ladona Ridgel 02/06/20 no changes.  Patient called in with 6 lb weight gain and shortness of breath.   Past Medical History:  Diagnosis Date  . Anemia   . Cervical disc disease   . COPD (chronic obstructive pulmonary disease) (HCC)   . Diabetes mellitus without complication (HCC)   . Essential hypertension   . GERD (gastroesophageal reflux disease)   . Heart failure (HCC) 2018  . History of cardiac catheterization    Normal coronaries March 2015  . History of non-ST elevation myocardial infarction (NSTEMI)    Secondary to SVT  . Lymphedema   . Peripheral neuropathy   . Spinal stenosis   . SVT (supraventricular tachycardia) (HCC)     Past Surgical History:  Procedure Laterality Date  . CESAREAN SECTION    . ESOPHAGOGASTRODUODENOSCOPY N/A 02/10/2014   BJS:EGBTDV tiny antral erosion; otherwise normal EGD. No explanation for patient's symptoms. Gallbladder needs further evaluation.  [name] LEFT HEART CATHETERIZATION WITH CORONARY ANGIOGRAM N/A 06/03/2013   Procedure: LEFT HEART CATHETERIZATION WITH CORONARY ANGIOGRAM;  Surgeon: Lesleigh Noe, MD;  Location: [facility] CATH LAB;  Service: Cardiovascular;  Laterality: N/A;    Current Medications: No outpatient medications have been marked as taking for the 06/29/20 encounter (Appointment) with Dyann Kief, PA-C.     Allergies:   Bee venom and Tdap [tetanus-diphth-acell pertussis]   Social History    Socioeconomic History  . Marital status: Married    Spouse name: Not on file  . Number of children: Not on file  . Years of education: Not on file  . Highest education level: Not on file  Occupational History  . Occupation: [employer]    Employer: [employer]  Tobacco Use  . Smoking status: Former Smoker    Packs/day: 1.00    Years: 18.00    Pack years: 18.00    Types: Cigarettes    Start date: 09/17/2003    Quit date: 02/01/2019    Years since quitting: 1.4  . Smokeless tobacco: Never Used  Vaping Use  . Vaping Use: Never used  Substance and Sexual Activity  . Alcohol use: No    Alcohol/week: 0.0 standard drinks  . Drug use: Yes    Types: Marijuana  . Sexual activity: Not Currently    Birth control/protection: Post-menopausal  Other Topics Concern  . Not on file  Social History Narrative  . Not on file   Social Determinants of Health   Financial Resource Strain: Not on file  Food Insecurity: Not on file  Transportation Needs: Not on file  Physical Activity: Not on file  Stress: Not on file  Social Connections: Not on file     Family History:  The patient's ***family history includes Angina in her mother; Colon cancer in an other family member; Diabetes in her father, maternal grandfather, and mother; Heart attack in her mother; Heart failure in her brother and maternal  grandmother; Hypertension in her daughter, father, mother, and son; Stroke in her maternal grandmother; Suicidality in her sister.   ROS:   Please see the history of present illness.    ROS All other systems reviewed and are negative.   PHYSICAL EXAM:   VS:  LMP 01/05/2015   Physical Exam  GEN: Well nourished, well developed, in no acute distress  HEENT: normal  Neck: no JVD, carotid bruits, or masses Cardiac:RRR; no murmurs, rubs, or gallops  Respiratory:  clear to auscultation bilaterally, normal work of breathing GI: soft, nontender, nondistended, + BS Ext: without cyanosis, clubbing,  or edema, Good distal pulses bilaterally MS: no deformity or atrophy  Skin: warm and dry, no rash Neuro:  Alert and Oriented x 3, Strength and sensation are intact Psych: euthymic mood, full affect  Wt Readings from Last 3 Encounters:  02/06/20 205 lb 4.8 oz (93.1 kg)  10/22/19 188 lb (85.3 kg)  10/02/19 190 lb (86.2 kg)      Studies/Labs Reviewed:   EKG:  EKG is*** ordered today.  The ekg ordered today demonstrates ***  Recent Labs: No results found for requested labs within last 8760 hours.   Lipid Panel    Component Value Date/Time   CHOL 204 (H) 06/01/2013 0438   TRIG 60 06/01/2013 0438   HDL 76 06/01/2013 0438   CHOLHDL 2.7 06/01/2013 0438   VLDL 12 06/01/2013 0438   LDLCALC 116 (H) 06/01/2013 0438    Additional studies/ records that were reviewed today include:  ***   Risk Assessment/Calculations:   {Does this patient have ATRIAL FIBRILLATION?:[id number]}     ASSESSMENT:    1. Dyspnea on exertion   2. PSVT (paroxysmal supraventricular tachycardia) (HCC)   3. Essential hypertension   4. COPD  GOLD 0/ atypical insp portion of f/v curve       PLAN:  In order of problems listed above:  DOE/weight gain. Echo 06/27/19 EF>75% diastolic parameters indeterminate, mild LVH  SVT controlled on metorpolol  HTN  Obesity  COPD   Shared Decision Making/Informed Consent   {Are you ordering a CV Procedure (e.g. stress test, cath, DCCV, TEE, etc)?   Press F2        :[id number]}    Medication Adjustments/Labs and Tests Ordered: Current medicines are reviewed at length with the patient today.  Concerns regarding medicines are outlined above.  Medication changes, Labs and Tests ordered today are listed in the Patient Instructions below. There are no Patient Instructions on file for this visit.   Elson Clan, PA-C  06/29/2020 9:42 AM    [facility] [address] [place], [place], [place]  [address] Phone: [phone]; Fax: [phone]

## 2020-06-30 ENCOUNTER — Other Ambulatory Visit: Payer: Self-pay

## 2020-06-30 ENCOUNTER — Ambulatory Visit (INDEPENDENT_AMBULATORY_CARE_PROVIDER_SITE_OTHER): Payer: Medicare Other | Admitting: Physician Assistant

## 2020-06-30 ENCOUNTER — Encounter: Payer: Self-pay | Admitting: Physician Assistant

## 2020-06-30 VITALS — BP 130/62 | HR 86 | Ht 61.0 in | Wt 210.0 lb

## 2020-06-30 DIAGNOSIS — R635 Abnormal weight gain: Secondary | ICD-10-CM

## 2020-06-30 DIAGNOSIS — I10 Essential (primary) hypertension: Secondary | ICD-10-CM | POA: Diagnosis not present

## 2020-06-30 DIAGNOSIS — I471 Supraventricular tachycardia: Secondary | ICD-10-CM

## 2020-06-30 DIAGNOSIS — J449 Chronic obstructive pulmonary disease, unspecified: Secondary | ICD-10-CM | POA: Diagnosis not present

## 2020-06-30 MED ORDER — METOPROLOL SUCCINATE ER 100 MG PO TB24: 100.0000 mg | ORAL_TABLET | Freq: Every day | ORAL | 3 refills | Status: DC

## 2020-06-30 NOTE — Progress Notes (Signed)
Cardiology Office Note    Date:  06/30/2020   ID:  Gina Bradley, DOB 04-03-1961, MRN [id number]   PCP:  Elfredia Nevins, MD   [facility]  Cardiologist:  Nona Dell, MD  Advanced Practice Provider:  No care team member to display Electrophysiologist:  Lewayne Bunting, MD   [phone]   Chief Complaint  Patient presents with  . Follow-up    History of Present Illness:  Gina Bradley is a 60 y.o. female with history of SVT maintained on toprol, HTN, Obesity, COPD  Last saw Dr. Diona Browner 06/2019 and doing well. Dr. Ladona Ridgel 02/06/20 no changes.  Patient called in with 6 lb weight gain and shortness of breath.  Patient says her face, neck, eyes are swollen for 4-5 days. Pulmonary has changed toprol xl 100 mg 1/2 bid. Having worse palpitations in the afternoon.  Is on chronic steroids. Drinks gatorade daily. Drinks sweet tea daily. Was going to lymphedema clinic but not using her compression. Needs to see renal and endocrine.      Past Medical History:  Diagnosis Date  . Anemia   . Cervical disc disease   . COPD (chronic obstructive pulmonary disease) (HCC)   . Diabetes mellitus without complication (HCC)   . Essential hypertension   . GERD (gastroesophageal reflux disease)   . Heart failure (HCC) 2018  . History of cardiac catheterization    Normal coronaries March 2015  . History of non-ST elevation myocardial infarction (NSTEMI)    Secondary to SVT  . Lymphedema   . Peripheral neuropathy   . Spinal stenosis   . SVT (supraventricular tachycardia) (HCC)     Past Surgical History:  Procedure Laterality Date  . CESAREAN SECTION    . ESOPHAGOGASTRODUODENOSCOPY N/A 02/10/2014   IRJ:JOACZY tiny antral erosion; otherwise normal EGD. No explanation for patient's symptoms. Gallbladder needs further evaluation.  [name] LEFT HEART CATHETERIZATION WITH CORONARY ANGIOGRAM N/A 06/03/2013   Procedure: LEFT HEART CATHETERIZATION WITH CORONARY ANGIOGRAM;   Surgeon: Lesleigh Noe, MD;  Location: [facility] CATH LAB;  Service: Cardiovascular;  Laterality: N/A;    Current Medications: Current Meds  Medication Sig  . albuterol (PROVENTIL HFA;VENTOLIN HFA) 108 (90 Base) MCG/ACT inhaler Inhale into the lungs every 6 (six) hours as needed for wheezing or shortness of breath.  [name] albuterol (PROVENTIL) (2.5 MG/3ML) 0.083% nebulizer solution Take 2.5 mg by nebulization 4 (four) times daily.  [name] ELLIPTA 62.5-25 MCG/INH AEPB Inhale 1 puff into the lungs daily.   [name] aspirin EC 81 MG EC tablet Take 1 tablet (81 mg total) by mouth daily.  [name] atorvastatin (LIPITOR) 20 MG tablet Take 20 mg by mouth every evening.   [name] co-enzyme Q-10 30 MG capsule Take 100 mg by mouth daily.  [name] EPINEPHrine (EPIPEN 2-PAK) 0.3 mg/0.3 mL IJ SOAJ injection Inject 0.3 mLs (0.3 mg total) into the muscle once as needed (for severe allergic reaction). CAll 911 immediately if you have to use this medicine  . fluticasone (FLONASE) 50 MCG/ACT nasal spray Place 2 sprays into both nostrils daily.  . furosemide (LASIX) 20 MG tablet TAKE (1) TABLET BY MOUTH 2 TIMES DAILY AS NEEDED.  [name] gabapentin (NEURONTIN) 100 MG capsule Take 2 capsules (200 mg total) by mouth 3 (three) times daily.  [name] HYDROcodone-acetaminophen (NORCO) 10-325 MG tablet Take 1 tablet by mouth every 6 (six) hours as needed for severe pain. pain  . isosorbide mononitrate (ISMO) 10 MG tablet Take 10 mg by mouth daily.  [name]  losartan (COZAAR) 25 MG tablet Take 25 mg by mouth daily.  . metFORMIN (GLUCOPHAGE) 500 MG tablet Take 500 mg by mouth 2 (two) times daily.   . metoprolol succinate (TOPROL-XL) 100 MG 24 hr tablet Take 1 tablet (100 mg total) by mouth daily. Take with or immediately following a meal.  . nitroGLYCERIN (NITROSTAT) 0.4 MG SL tablet Place 1 tablet (0.4 mg total) under the tongue every 5 (five) minutes as needed for chest pain.  . Oxycodone HCl 10 MG TABS Take 10 mg by mouth 4 (four) times daily as needed.  . pantoprazole  (PROTONIX) 40 MG tablet Take 40 mg by mouth 2 (two) times daily.  . predniSONE (DELTASONE) 5 MG tablet Take 1 tablet (5 mg total) by mouth daily with breakfast.  . primidone (MYSOLINE) 50 MG tablet Take 50 mg by mouth at bedtime.   . [DISCONTINUED] metoprolol succinate (TOPROL-XL) 100 MG 24 hr tablet TAKE (1) TABLET BY MOUTH ONCE DAILY.     Allergies:   Bee venom and Tdap [tetanus-diphth-acell pertussis]   Social History   Socioeconomic History  . Marital status: Married    Spouse name: Not on file  . Number of children: Not on file  . Years of education: Not on file  . Highest education level: Not on file  Occupational History  . Occupation: [employer]    Employer: [employer]  Tobacco Use  . Smoking status: Former Smoker    Packs/day: 1.00    Years: 18.00    Pack years: 18.00    Types: Cigarettes    Start date: 09/17/2003    Quit date: 02/01/2019    Years since quitting: 1.4  . Smokeless tobacco: Never Used  Vaping Use  . Vaping Use: Never used  Substance and Sexual Activity  . Alcohol use: No    Alcohol/week: 0.0 standard drinks  . Drug use: Yes    Types: Marijuana  . Sexual activity: Not Currently    Birth control/protection: Post-menopausal  Other Topics Concern  . Not on file  Social History Narrative  . Not on file   Social Determinants of Health   Financial Resource Strain: Not on file  Food Insecurity: Not on file  Transportation Needs: Not on file  Physical Activity: Not on file  Stress: Not on file  Social Connections: Not on file     Family History:  The patient's family history includes Angina in her mother; Colon cancer in an other family member; Diabetes in her father, maternal grandfather, and mother; Heart attack in her mother; Heart failure in her brother and maternal grandmother; Hypertension in her daughter, father, mother, and son; Stroke in her maternal grandmother; Suicidality in her sister.   ROS:   Please see the history of present  illness.    ROS All other systems reviewed and are negative.   PHYSICAL EXAM:   VS:  BP 130/62   Pulse 86   Ht  (1.549 m)   Wt 210 lb (95.3 kg)   LMP 01/05/2015   SpO2 98%   BMI 39.68 kg/m   Physical Exam  GEN: Obese, in no acute distress, face is puffy and swollen Neck: no JVD, carotid bruits, or masses Cardiac:RRR; no murmurs, rubs, or gallops  Respiratory:  clear to auscultation bilaterally, normal work of breathing GI: soft, nontender, nondistended, + BS Ext: without cyanosis, clubbing, or edema, Good distal pulses bilaterally Neuro:  Alert and Oriented x 3 Psych: euthymic mood, full affect  Wt Readings from  Last 3 Encounters:  06/30/20 210 lb (95.3 kg)  02/06/20 205 lb 4.8 oz (93.1 kg)  10/22/19 188 lb (85.3 kg)      Studies/Labs Reviewed:   EKG:  EKG is not ordered today.    Recent Labs: No results found for requested labs within last 8760 hours.   Lipid Panel    Component Value Date/Time   CHOL 204 (H) 06/01/2013 0438   TRIG 60 06/01/2013 0438   HDL 76 06/01/2013 0438   CHOLHDL 2.7 06/01/2013 0438   VLDL 12 06/01/2013 0438   LDLCALC 116 (H) 06/01/2013 0438    Additional studies/ records that were reviewed today include:  Echocardiogram 06/27/2019: 1. Left ventricular ejection fraction, by estimation, is >75%. The left  ventricle has hyperdynamic function. The left ventricle has no regional  wall motion abnormalities. There is mild left ventricular hypertrophy.  Left ventricular diastolic parameters  are indeterminate.   2. Right ventricular systolic function is normal. The right ventricular  size is normal.   3. The mitral valve is normal in structure. No evidence of mitral valve  regurgitation. No evidence of mitral stenosis.   4. The aortic valve was not well visualized. Aortic valve regurgitation  is not visualized. No aortic stenosis is present.   5. The inferior vena cava is normal in size with greater than 50%  respiratory variability,  suggesting right atrial pressure of 3 mmHg.     Risk Assessment/Calculations:         ASSESSMENT:    1. Weight gain   2. SVT (supraventricular tachycardia) (HCC)   3. Essential hypertension   4. Morbid obesity (HCC)   5. COPD  GOLD 0/ atypical insp portion of f/v curve       PLAN:  In order of problems listed above:  DOE/weight gain. Echo 06/27/19 EF>75% diastolic parameters indeterminate, mild LVH.  Patient is taking Lasix 20 mg daily and weight has come down 7 pounds.  She is drinking Gatorade and sweet tea daily.  I asked her to stop drinking this.  Most her swelling is in her face and neck.  She was previously seen at the lymphedema clinic and has not used her compressions lately.  Recommend she do this and follow-up with endocrine and renal.  She declines having labs done here today.  PCP had increased her Lasix to 20 twice daily but she does not want to take this dose because of her kidneys. Declines labs today  SVT previously controlled on Toprol-XL 100 mg daily but pulmonary change it to 50 mg twice daily and she has worsened palpitations in the afternoon.  She is also drinking a lot of caffeine in the afternoon.  I have asked her to go back to Toprol-XL 100 mg once daily and cut back on her sweet tea and Gatorade.  HTN blood pressure controlled  Obesity weight loss recommended  COPD followed by Dr. Sherene Sires on chronic steroids which could be contributing to her face swelling. F/u with pulm and endocrine   Shared Decision Making/Informed Consent        Medication Adjustments/Labs and Tests Ordered: Current medicines are reviewed at length with the patient today.  Concerns regarding medicines are outlined above.  Medication changes, Labs and Tests ordered today are listed in the Patient Instructions below. Patient Instructions   Medication Instructions:  Take Toprol XL  daily  *If you need a refill on your cardiac medications before your next appointment, please  call your pharmacy*   Lab Work:  None today If you have labs (blood work) drawn today and your tests are completely normal, you will receive your results only by: [name] MyChart Message (if you have MyChart) OR . A paper copy in the mail If you have any lab test that is abnormal or we need to change your treatment, we will call you to review the results.   Testing/Procedures: None today   Follow-Up: At [facility], you and your health needs are our priority.  As part of our continuing mission to provide you with exceptional heart care, we have created designated Provider Care Teams.  These Care Teams include your primary Cardiologist (physician) and Advanced Practice Providers (APPs -  Physician Assistants and Nurse Practitioners) who all work together to provide you with the care you need, when you need it.  We recommend signing up for the patient portal called "MyChart".  Sign up information is provided on this After Visit Summary.  MyChart is used to connect with patients for Virtual Visits (Telemedicine).  Patients are able to view lab/test results, encounter notes, upcoming appointments, etc.  Non-urgent messages can be sent to your provider as well.   To learn more about what you can do with MyChart, go to [email or web address].    Your next appointment:   6 month(s)  The format for your next appointment:   In Person  Provider:   Jacolyn Reedy, PA-C   Other Instructions  STOP drinking sweet tea and gatoraide   Wear your compression stockings  Follow up with your nephrologist and endocrinologist    Follow 2 gram sodium diet     Two Gram Sodium Diet 2000 mg  What is Sodium? Sodium is a mineral found naturally in many foods. The most significant source of sodium in the diet is table salt, which is about 40% sodium.  Processed, convenience, and preserved foods also contain a large amount of sodium.  The body needs only 500 mg of sodium daily to function,  A normal  diet provides more than enough sodium even if you do not use salt.  Why Limit Sodium? A build up of sodium in the body can cause thirst, increased blood pressure, shortness of breath, and water retention.  Decreasing sodium in the diet can reduce edema and risk of heart attack or stroke associated with high blood pressure.  Keep in mind that there are many other factors involved in these health problems.  Heredity, obesity, lack of exercise, cigarette smoking, stress and what you eat all play a role.  General Guidelines:  Do not add salt at the table or in cooking.  One teaspoon of salt contains over 2 grams of sodium.  Read food labels  Avoid processed and convenience foods  Ask your dietitian before eating any foods not dicussed in the menu planning guidelines  Consult your physician if you wish to use a salt substitute or a sodium containing medication such as antacids.  Limit milk and milk products to 16 oz (2 cups) per day.  Shopping Hints:  READ LABELS!! "Dietetic" does not necessarily mean low sodium.  Salt and other sodium ingredients are often added to foods during processing.   Menu Planning Guidelines Food Group Choose More Often Avoid  Beverages (see also the milk group All fruit juices, low-sodium, salt-free vegetables juices, low-sodium carbonated beverages Regular vegetable or tomato juices, commercially softened water used for drinking or cooking  Breads and Cereals Enriched white, wheat, rye and pumpernickel bread, hard rolls and dinner rolls; muffins, cornbread  and waffles; most dry cereals, cooked cereal without added salt; unsalted crackers and breadsticks; low sodium or homemade bread crumbs Bread, rolls and crackers with salted tops; quick breads; instant hot cereals; pancakes; commercial bread stuffing; self-rising flower and biscuit mixes; regular bread crumbs or cracker crumbs  Desserts and Sweets Desserts and sweets mad with mild should be within allowance Instant  pudding mixes and cake mixes  Fats Butter or margarine; vegetable oils; unsalted salad dressings, regular salad dressings limited to 1 Tbs; light, sour and heavy cream Regular salad dressings containing bacon fat, bacon bits, and salt pork; snack dips made with instant soup mixes or processed cheese; salted nuts  Fruits Most fresh, frozen and canned fruits Fruits processed with salt or sodium-containing ingredient (some dried fruits are processed with sodium sulfites        Vegetables Fresh, frozen vegetables and low- sodium canned vegetables Regular canned vegetables, sauerkraut, pickled vegetables, and others prepared in brine; frozen vegetables in sauces; vegetables seasoned with ham, bacon or salt pork  Condiments, Sauces, Miscellaneous  Salt substitute with physician's approval; pepper, herbs, spices; vinegar, lemon or lime juice; hot pepper sauce; garlic powder, onion powder, low sodium soy sauce (1 Tbs.); low sodium condiments (ketchup, chili sauce, mustard) in limited amounts (1 tsp.) fresh ground horseradish; unsalted tortilla chips, pretzels, potato chips, popcorn, salsa (1/4 cup) Any seasoning made with salt including garlic salt, celery salt, onion salt, and seasoned salt; sea salt, rock salt, kosher salt; meat tenderizers; monosodium glutamate; mustard, regular soy sauce, barbecue, sauce, chili sauce, teriyaki sauce, steak sauce, Worcestershire sauce, and most flavored vinegars; canned gravy and mixes; regular condiments; salted snack foods, olives, picles, relish, horseradish sauce, catsup   Food preparation: Try these seasonings Meats:    Pork Sage, onion Serve with applesauce  Chicken Poultry seasoning, thyme, parsley Serve with cranberry sauce  Lamb Curry powder, rosemary, garlic, thyme Serve with mint sauce or jelly  Veal Marjoram, basil Serve with current jelly, cranberry sauce  Beef Pepper, bay leaf Serve with dry mustard, unsalted chive butter  Fish Bay leaf, dill Serve with  unsalted lemon butter, unsalted parsley butter  Vegetables:    Asparagus Lemon juice   Broccoli Lemon juice   Carrots Mustard dressing parsley, mint, nutmeg, glazed with unsalted butter and sugar   Green beans Marjoram, lemon juice, nutmeg,dill seed   Tomatoes Basil, marjoram, onion   Spice /blend for [employer]" 4 tsp ground thyme 1 tsp ground sage 3 tsp ground rosemary 4 tsp ground marjoram   Test your knowledge 1. A product that says "Salt Free" may still contain sodium. True or False 2. Garlic Powder and Hot Pepper Sauce an be used as alternative seasonings.True or False 3. Processed foods have more sodium than fresh foods.  True or False 4. Canned Vegetables have less sodium than froze True or False  WAYS TO DECREASE YOUR SODIUM INTAKE 1. Avoid the use of added salt in cooking and at the table.  Table salt (and other prepared seasonings which contain salt) is probably one of the greatest sources of sodium in the diet.  Unsalted foods can gain flavor from the sweet, sour, and butter taste sensations of herbs and spices.  Instead of using salt for seasoning, try the following seasonings with the foods listed.  Remember: how you use them to enhance natural food flavors is limited only by your creativity... Allspice-Meat, fish, eggs, fruit, peas, red and yellow vegetables Almond Extract-Fruit baked goods Anise Seed-Sweet breads, fruit, carrots, beets, cottage  cheese, cookies (tastes like licorice) Basil-Meat, fish, eggs, vegetables, rice, vegetables salads, soups, sauces Bay Leaf-Meat, fish, stews, poultry Burnet-Salad, vegetables (cucumber-like flavor) Caraway Seed-Bread, cookies, cottage cheese, meat, vegetables, cheese, rice Cardamon-Baked goods, fruit, soups Celery Powder or seed-Salads, salad dressings, sauces, meatloaf, soup, bread.Do not use  celery salt Chervil-Meats, salads, fish, eggs, vegetables, cottage cheese (parsley-like flavor) Chili Power-Meatloaf, chicken cheese,  corn, eggplant, egg dishes Chives-Salads cottage cheese, egg dishes, soups, vegetables, sauces Cilantro-Salsa, casseroles Cinnamon-Baked goods, fruit, pork, lamb, chicken, carrots Cloves-Fruit, baked goods, fish, pot roast, green beans, beets, carrots Coriander-Pastry, cookies, meat, salads, cheese (lemon-orange flavor) Cumin-Meatloaf, fish,cheese, eggs, cabbage,fruit pie (caraway flavor) [employer], fruit, eggs, fish, poultry, cottage cheese, vegetables Dill Seed-Meat, cottage cheese, poultry, vegetables, fish, salads, bread Fennel Seed-Bread, cookies, apples, pork, eggs, fish, beets, cabbage, cheese, Licorice-like flavor Garlic-(buds or powder) Salads, meat, poultry, fish, bread, butter, vegetables, potatoes.Do not  use garlic salt Ginger-Fruit, vegetables, baked goods, meat, fish, poultry Horseradish Root-Meet, vegetables, butter Lemon Juice or Extract-Vegetables, fruit, tea, baked goods, fish salads Mace-Baked goods fruit, vegetables, fish, poultry (taste like nutmeg) Maple Extract-Syrups Marjoram-Meat, chicken, fish, vegetables, breads, green salads (taste like Sage) Mint-Tea, lamb, sherbet, vegetables, desserts, carrots, cabbage Mustard, Dry or Seed-Cheese, eggs, meats, vegetables, poultry Nutmeg-Baked goods, fruit, chicken, eggs, vegetables, desserts Onion Powder-Meat, fish, poultry, vegetables, cheese, eggs, bread, rice salads (Do not use   Onion salt) Orange Extract-Desserts, baked goods Oregano-Pasta, eggs, cheese, onions, pork, lamb, fish, chicken, vegetables, green salads Paprika-Meat, fish, poultry, eggs, cheese, vegetables Parsley Flakes-Butter, vegetables, meat fish, poultry, eggs, bread, salads (certain forms may   Contain sodium Pepper-Meat fish, poultry, vegetables, eggs Peppermint Extract-Desserts, baked goods Poppy Seed-Eggs, bread, cheese, fruit dressings, baked goods, noodles, vegetables, cottage  [employer],  poultry, meat, fish, cauliflower, turnips,eggs bread Saffron-Rice, bread, veal, chicken, fish, eggs Sage-Meat, fish, poultry, onions, eggplant, tomateos, pork, stews Savory-Eggs, salads, poultry, meat, rice, vegetables, soups, pork Tarragon-Meat, poultry, fish, eggs, butter, vegetables (licorice-like flavor)  Thyme-Meat, poultry, fish, eggs, vegetables, (clover-like flavor), sauces, soups Tumeric-Salads, butter, eggs, fish, rice, vegetables (saffron-like flavor) Vanilla Extract-Baked goods, candy Vinegar-Salads, vegetables, meat marinades Walnut Extract-baked goods, candy  2. Choose your Foods Wisely   The following is a list of foods to avoid which are high in sodium:  Meats-Avoid all smoked, canned, salt cured, dried and kosher meat and fish as well as Anchovies   [employer] meats:Bologna, Liverwurst, Pastrami Canned meat or fish  Marinated herring Caviar    Pepperoni Corned Beef   Pizza Dried chipped beef  Salami Frozen breaded fish or meat Salt pork Frankfurters or hot dogs  Sardines Gefilte fish   Sausage Ham (boiled ham, Proscuitto Smoked butt    spiced ham)   Spam      TV Dinners Vegetables Canned vegetables (Regular) Relish Canned mushrooms  Sauerkraut Olives    Tomato juice Pickles  Bakery and Dessert Products Canned puddings  Cream pies Cheesecake   Decorated cakes Cookies  Beverages/Juices Tomato juice, regular  Gatorade   V-8 vegetable juice, regular  Breads and Cereals Biscuit mixes   Salted potato chips, corn chips, pretzels Bread stuffing mixes  Salted crackers and rolls Pancake and waffle mixes Self-rising flour  Seasonings Accent    Meat sauces Barbecue sauce  Meat tenderizer Catsup    Monosodium glutamate (MSG) Celery salt   Onion salt Chili sauce   Prepared mustard Garlic salt   Salt, seasoned salt, sea salt Gravy mixes   Soy sauce Horseradish  Steak sauce Ketchup   Tartar sauce Lite salt    Teriyaki sauce Marinade  mixes   Worcestershire sauce  Others Baking powder   Cocoa and cocoa mixes Baking soda   Commercial casserole mixes Candy-caramels, chocolate  Dehydrated soups    Bars, fudge,nougats  Instant rice and pasta mixes Canned broth or soup  Maraschino cherries Cheese, aged and processed cheese and cheese spreads  Learning Assessment Quiz  Indicated T (for True) or F (for False) for each of the following statements:  1. _____ Fresh fruits and vegetables and unprocessed grains are generally low in sodium 2. _____ Water may contain a considerable amount of sodium, depending on the source 3. _____ You can always tell if a food is high in sodium by tasting it 4. _____ Certain laxatives my be high in sodium and should be avoided unless prescribed   by a physician or pharmacist 5. _____ Salt substitutes may be used freely by anyone on a sodium restricted diet 6. _____ Sodium is present in table salt, food additives and as a natural component of   most foods 7. _____ Table salt is approximately 90% sodium 8. _____ Limiting sodium intake may help prevent excess fluid accumulation in the body 9. _____ On a sodium-restricted diet, seasonings such as bouillon soy sauce, and    cooking wine should be used in place of table salt 10. _____ On an ingredient list, a product which lists monosodium glutamate as the first   ingredient is an appropriate food to include on a low sodium diet  Circle the best answer(s) to the following statements (Hint: there may be more than one correct answer)  11. On a low-sodium diet, some acceptable snack items are:    A. Olives  F. Bean dip   K. Grapefruit juice    B. Salted Pretzels G. Commercial Popcorn   L. Canned peaches    C. Carrot Sticks  H. Bouillon   M. Unsalted nuts   D. [place]French fries  I. Peanut butter crackers N. Salami   E. Sweet pickles J. Tomato Juice   O. Pizza  12.  Seasonings that may be used freely on a reduced - sodium diet include   A. Lemon  wedges F.Monosodium glutamate K. Celery seed    B.Soysauce   G. Pepper   L. Mustard powder   C. Sea salt  H. Cooking wine  M. Onion flakes   D. Vinegar  E. Prepared horseradish N. Salsa   E. Sage   J. Worcestershire sauce  O. [address]Chutney      Elson ClanSigned, Lavar Rosenzweig, PA-C  06/30/2020 12:12 PM    [facility] [address], [place], [place]  [address] Phone: [phone]; Fax: [phone]

## 2020-06-30 NOTE — Patient Instructions (Addendum)
Medication Instructions:  Take Toprol XL 100mg  daily  *If you need a refill on your cardiac medications before your next appointment, please call your pharmacy*   Lab Work: None today If you have labs (blood work) drawn today and your tests are completely normal, you will receive your results only by: MyChart Message (if you have MyChart) OR . A paper copy in the mail If you have any lab test that is abnormal or we need to change your treatment, we will call you to review the results.   Testing/Procedures: None today   Follow-Up: At [facility], you and your health needs are our priority.  As part of our continuing mission to provide you with exceptional heart care, we have created designated Provider Care Teams.  These Care Teams include your primary Cardiologist (physician) and Advanced Practice Providers (APPs -  Physician Assistants and Nurse Practitioners) who all work together to provide you with the care you need, when you need it.  We recommend signing up for the patient portal called "MyChart".  Sign up information is provided on this After Visit Summary.  MyChart is used to connect with patients for Virtual Visits (Telemedicine).  Patients are able to view lab/test results, encounter notes, upcoming appointments, etc.  Non-urgent messages can be sent to your provider as well.   To learn more about what you can do with MyChart, go to [facility].    Your next appointment:   6 month(s)  The format for your next appointment:   In Person  Provider:  [email or web address]   Other Instructions  STOP drinking sweet tea and gatoraide   Wear your compression stockings  Follow up with your nephrologist and endocrinologist    Follow 2 gram sodium diet     Two Gram Sodium Diet 2000 mg  What is Sodium? Sodium is a mineral found naturally in many foods. The most significant source of sodium in the diet is table salt, which is about 40% sodium.  Processed,  convenience, and preserved foods also contain a large amount of sodium.  The body needs only 500 mg of sodium daily to function,  A normal diet provides more than enough sodium even if you do not use salt.  Why Limit Sodium? A build up of sodium in the body can cause thirst, increased blood pressure, shortness of breath, and water retention.  Decreasing sodium in the diet can reduce edema and risk of heart attack or stroke associated with high blood pressure.  Keep in mind that there are many other factors involved in these health problems.  Heredity, obesity, lack of exercise, cigarette smoking, stress and what you eat all play a role.  General Guidelines:  Do not add salt at the table or in cooking.  One teaspoon of salt contains over 2 grams of sodium.  Read food labels  Avoid processed and convenience foods  Ask your dietitian before eating any foods not dicussed in the menu planning guidelines  Consult your physician if you wish to use a salt substitute or a sodium containing medication such as antacids.  Limit milk and milk products to 16 oz (2 cups) per day.  Shopping Hints:  READ LABELS!! "Dietetic" does not necessarily mean low sodium.  Salt and other sodium ingredients are often added to foods during processing.   Menu Planning Guidelines Food Group Choose More Often Avoid  Beverages (see also the milk group All fruit juices, low-sodium, salt-free vegetables juices, low-sodium carbonated beverages Regular vegetable  or tomato juices, commercially softened water used for drinking or cooking  Breads and Cereals Enriched white, wheat, rye and pumpernickel bread, hard rolls and dinner rolls; muffins, cornbread and waffles; most dry cereals, cooked cereal without added salt; unsalted crackers and breadsticks; low sodium or homemade bread crumbs Bread, rolls and crackers with salted tops; quick breads; instant hot cereals; pancakes; commercial bread stuffing; self-rising flower and  biscuit mixes; regular bread crumbs or cracker crumbs  Desserts and Sweets Desserts and sweets mad with mild should be within allowance Instant pudding mixes and cake mixes  Fats Butter or margarine; vegetable oils; unsalted salad dressings, regular salad dressings limited to 1 Tbs; light, sour and heavy cream Regular salad dressings containing bacon fat, bacon bits, and salt pork; snack dips made with instant soup mixes or processed cheese; salted nuts  Fruits Most fresh, frozen and canned fruits Fruits processed with salt or sodium-containing ingredient (some dried fruits are processed with sodium sulfites        Vegetables Fresh, frozen vegetables and low- sodium canned vegetables Regular canned vegetables, sauerkraut, pickled vegetables, and others prepared in brine; frozen vegetables in sauces; vegetables seasoned with ham, bacon or salt pork  Condiments, Sauces, Miscellaneous  Salt substitute with physician's approval; pepper, herbs, spices; vinegar, lemon or lime juice; hot pepper sauce; garlic powder, onion powder, low sodium soy sauce (1 Tbs.); low sodium condiments (ketchup, chili sauce, mustard) in limited amounts (1 tsp.) fresh ground horseradish; unsalted tortilla chips, pretzels, potato chips, popcorn, salsa (1/4 cup) Any seasoning made with salt including garlic salt, celery salt, onion salt, and seasoned salt; sea salt, rock salt, kosher salt; meat tenderizers; monosodium glutamate; mustard, regular soy sauce, barbecue, sauce, chili sauce, teriyaki sauce, steak sauce, Worcestershire sauce, and most flavored vinegars; canned gravy and mixes; regular condiments; salted snack foods, olives, picles, relish, horseradish sauce, catsup   Food preparation: Try these seasonings Meats:    Pork Sage, onion Serve with applesauce  Chicken Poultry seasoning, thyme, parsley Serve with cranberry sauce  Lamb Curry powder, rosemary, garlic, thyme Serve with mint sauce or jelly  Veal Marjoram, basil  Serve with current jelly, cranberry sauce  Beef Pepper, bay leaf Serve with dry mustard, unsalted chive butter  Fish [place] leaf, dill Serve with unsalted lemon butter, unsalted parsley butter  Vegetables:    Asparagus Lemon juice   Broccoli Lemon juice   Carrots Mustard dressing parsley, mint, nutmeg, glazed with unsalted butter and sugar   Green beans Marjoram, lemon juice, nutmeg,dill seed   Tomatoes Basil, marjoram, onion   Spice /blend for [employer]" 4 tsp ground thyme 1 tsp ground sage 3 tsp ground rosemary 4 tsp ground marjoram   Test your knowledge 1. A product that says "Salt Free" may still contain sodium. True or False 2. Garlic Powder and Hot Pepper Sauce an be used as alternative seasonings.True or False 3. Processed foods have more sodium than fresh foods.  True or False 4. Canned Vegetables have less sodium than froze True or False  WAYS TO DECREASE YOUR SODIUM INTAKE 1. Avoid the use of added salt in cooking and at the table.  Table salt (and other prepared seasonings which contain salt) is probably one of the greatest sources of sodium in the diet.  Unsalted foods can gain flavor from the sweet, sour, and butter taste sensations of herbs and spices.  Instead of using salt for seasoning, try the following seasonings with the foods listed.  Remember: how you use them to enhance  natural food flavors is limited only by your creativity... Allspice-Meat, fish, eggs, fruit, peas, red and yellow vegetables Almond Extract-Fruit baked goods Anise Seed-Sweet breads, fruit, carrots, beets, cottage cheese, cookies (tastes like licorice) Basil-Meat, fish, eggs, vegetables, rice, vegetables salads, soups, sauces Bay Leaf-Meat, fish, stews, poultry Burnet-Salad, vegetables (cucumber-like flavor) Caraway Seed-Bread, cookies, cottage cheese, meat, vegetables, cheese, rice Cardamon-Baked goods, fruit, soups Celery Powder or seed-Salads, salad dressings, sauces, meatloaf, soup, bread.Do not  use  celery salt Chervil-Meats, salads, fish, eggs, vegetables, cottage cheese (parsley-like flavor) Chili Power-Meatloaf, chicken cheese, corn, eggplant, egg dishes Chives-Salads cottage cheese, egg dishes, soups, vegetables, sauces Cilantro-Salsa, casseroles Cinnamon-Baked goods, fruit, pork, lamb, chicken, carrots Cloves-Fruit, baked goods, fish, pot roast, green beans, beets, carrots Coriander-Pastry, cookies, meat, salads, cheese (lemon-orange flavor) Cumin-Meatloaf, fish,cheese, eggs, cabbage,fruit pie (caraway flavor) [employer]ersCurry Powder-Meat, fruit, eggs, fish, poultry, cottage cheese, vegetables Dill Seed-Meat, cottage cheese, poultry, vegetables, fish, salads, bread Fennel Seed-Bread, cookies, apples, pork, eggs, fish, beets, cabbage, cheese, Licorice-like flavor Garlic-(buds or powder) Salads, meat, poultry, fish, bread, butter, vegetables, potatoes.Do not  use garlic salt Ginger-Fruit, vegetables, baked goods, meat, fish, poultry Horseradish Root-Meet, vegetables, butter Lemon Juice or Extract-Vegetables, fruit, tea, baked goods, fish salads Mace-Baked goods fruit, vegetables, fish, poultry (taste like nutmeg) Maple Extract-Syrups Marjoram-Meat, chicken, fish, vegetables, breads, green salads (taste like Sage) Mint-Tea, lamb, sherbet, vegetables, desserts, carrots, cabbage Mustard, Dry or Seed-Cheese, eggs, meats, vegetables, poultry Nutmeg-Baked goods, fruit, chicken, eggs, vegetables, desserts Onion Powder-Meat, fish, poultry, vegetables, cheese, eggs, bread, rice salads (Do not use   Onion salt) Orange Extract-Desserts, baked goods Oregano-Pasta, eggs, cheese, onions, pork, lamb, fish, chicken, vegetables, green salads Paprika-Meat, fish, poultry, eggs, cheese, vegetables Parsley Flakes-Butter, vegetables, meat fish, poultry, eggs, bread, salads (certain forms may   Contain sodium Pepper-Meat fish, poultry, vegetables, eggs Peppermint Extract-Desserts, baked goods Poppy  Seed-Eggs, bread, cheese, fruit dressings, baked goods, noodles, vegetables, cottage  Care[employer] RxCheese Poultry Seasoning-Poultry,veal Rosemary-Lamb, poultry, meat, fish, cauliflower, turnips,eggs bread Saffron-Rice, bread, veal, chicken, fish, eggs Sage-Meat, fish, poultry, onions, eggplant, tomateos, pork, stews Savory-Eggs, salads, poultry, meat, rice, vegetables, soups, pork Tarragon-Meat, poultry, fish, eggs, butter, vegetables (licorice-like flavor)  Thyme-Meat, poultry, fish, eggs, vegetables, (clover-like flavor), sauces, soups Tumeric-Salads, butter, eggs, fish, rice, vegetables (saffron-like flavor) Vanilla Extract-Baked goods, candy Vinegar-Salads, vegetables, meat marinades Walnut Extract-baked goods, candy  2. Choose your Foods Wisely   The following is a list of foods to avoid which are high in sodium:  Meats-Avoid all smoked, canned, salt cured, dried and kosher meat and fish as well as Anchovies   Lox Freescale SemiconductorBacon    Luncheon meats:Bologna, Liverwurst, Pastrami Canned meat or fish  Marinated herring Caviar    Pepperoni Corned Beef   Pizza Dried chipped beef  Salami Frozen breaded fish or meat Salt pork Frankfurters or hot dogs  Sardines Gefilte fish   Sausage Ham (boiled ham, Proscuitto Smoked butt    spiced ham)   Spam      TV Dinners Vegetables Canned vegetables (Regular) Relish Canned mushrooms  Sauerkraut Olives    Tomato juice Pickles  Bakery and Dessert Products Canned puddings  Cream pies Cheesecake   Decorated cakes Cookies  Beverages/Juices Tomato juice, regular  Gatorade   V-8 vegetable juice, regular  Breads and Cereals Biscuit mixes   Salted potato chips, corn chips, pretzels Bread stuffing mixes  Salted crackers and rolls Pancake and waffle mixes Self-rising flour  Seasonings Accent    Meat sauces Barbecue sauce  Meat tenderizer Catsup    Monosodium glutamate (MSG) Celery  salt   Onion salt Chili sauce   Prepared mustard Garlic salt   Salt, seasoned  salt, sea salt Gravy mixes   Soy sauce Horseradish   Steak sauce Ketchup   Tartar sauce Lite salt    Teriyaki sauce Marinade mixes   Worcestershire sauce  Others Baking powder   Cocoa and cocoa mixes Baking soda   Commercial casserole mixes Candy-caramels, chocolate  Dehydrated soups    Bars, fudge,nougats  Instant rice and pasta mixes Canned broth or soup  Maraschino cherries Cheese, aged and processed cheese and cheese spreads  Learning Assessment Quiz  Indicated T (for True) or F (for False) for each of the following statements:  1. _____ Fresh fruits and vegetables and unprocessed grains are generally low in sodium 2. _____ Water may contain a considerable amount of sodium, depending on the source 3. _____ You can always tell if a food is high in sodium by tasting it 4. _____ Certain laxatives my be high in sodium and should be avoided unless prescribed   by a physician or pharmacist 5. _____ Salt substitutes may be used freely by anyone on a sodium restricted diet 6. _____ Sodium is present in table salt, food additives and as a natural component of   most foods 7. _____ Table salt is approximately 90% sodium 8. _____ Limiting sodium intake may help prevent excess fluid accumulation in the body 9. _____ On a sodium-restricted diet, seasonings such as bouillon soy sauce, and    cooking wine should be used in place of table salt 10. _____ On an ingredient list, a product which lists monosodium glutamate as the first   ingredient is an appropriate food to include on a low sodium diet  Circle the best answer(s) to the following statements (Hint: there may be more than one correct answer)  11. On a low-sodium diet, some acceptable snack items are:    A. Olives  F. Bean dip   K. Grapefruit juice    B. Salted Pretzels G. Commercial Popcorn   L. Canned peaches    C. Carrot Sticks  H. Bouillon   M. Unsalted nuts   D. [place] fries  I. Peanut butter crackers N. Salami   E. Sweet  pickles J. Tomato Juice   O. Pizza  12.  Seasonings that may be used freely on a reduced - sodium diet include   A. Lemon wedges F.Monosodium glutamate K. Celery seed    B.Soysauce   G. Pepper   L. Mustard powder   C. Sea salt  H. Cooking wine  M. Onion flakes   D. Vinegar  E. Prepared horseradish N. Salsa   E. Sage   J. Worcestershire sauce  O. Chutney

## 2020-07-01 DIAGNOSIS — Z720 Tobacco use: Secondary | ICD-10-CM | POA: Diagnosis not present

## 2020-07-01 DIAGNOSIS — I10 Essential (primary) hypertension: Secondary | ICD-10-CM | POA: Diagnosis not present

## 2020-07-01 DIAGNOSIS — J449 Chronic obstructive pulmonary disease, unspecified: Secondary | ICD-10-CM | POA: Diagnosis not present

## 2020-07-01 DIAGNOSIS — E1140 Type 2 diabetes mellitus with diabetic neuropathy, unspecified: Secondary | ICD-10-CM | POA: Diagnosis not present

## 2020-07-03 ENCOUNTER — Ambulatory Visit: Payer: Medicare Other | Admitting: Gastroenterology

## 2020-07-09 NOTE — Progress Notes (Deleted)
Referring Provider: Elfredia Nevins, MD Primary Care Physician:  Elfredia Nevins, MD Primary Gastroenterologist:  Dr. Jena Gauss  No chief complaint on file.   HPI:   Gina Bradley is a 60 y.o. female presenting today at the request of Elfredia Nevins, MD for consult colonoscopy.  She has GI history significant for GERD, dysphagia, constipation, and family history of colon cancer in her younger sister and father. No prior colonoscopy. EGD in November 2015 with antral erosions, otherwise normal.  She was last seen in our office in September 2018 with dysphagia, refractory GERD, abdominal bloating in the setting of constipation.  Offered EGD and colonoscopy, but patient called to cancel due to tragedy in the family.  Today:   Past Medical History:  Diagnosis Date  . Anemia   . Cervical disc disease   . COPD (chronic obstructive pulmonary disease) (HCC)   . Diabetes mellitus without complication (HCC)   . Essential hypertension   . GERD (gastroesophageal reflux disease)   . Heart failure (HCC) 2018  . History of cardiac catheterization    Normal coronaries March 2015  . History of non-ST elevation myocardial infarction (NSTEMI)    Secondary to SVT  . Lymphedema   . Peripheral neuropathy   . Spinal stenosis   . SVT (supraventricular tachycardia) (HCC)     Past Surgical History:  Procedure Laterality Date  . CESAREAN SECTION    . ESOPHAGOGASTRODUODENOSCOPY N/A 02/10/2014   AVW:PVXYIA tiny antral erosion; otherwise normal EGD. No explanation for patient's symptoms. Gallbladder needs further evaluation.  [name] LEFT HEART CATHETERIZATION WITH CORONARY ANGIOGRAM N/A 06/03/2013   Procedure: LEFT HEART CATHETERIZATION WITH CORONARY ANGIOGRAM;  Surgeon: Lesleigh Noe, MD;  Location: [facility] CATH LAB;  Service: Cardiovascular;  Laterality: N/A;    Current Outpatient Medications  Medication Sig Dispense Refill  . albuterol (PROVENTIL HFA;VENTOLIN HFA) 108 (90 Base) MCG/ACT inhaler Inhale into  the lungs every 6 (six) hours as needed for wheezing or shortness of breath.    [name] albuterol (PROVENTIL) (2.5 MG/3ML) 0.083% nebulizer solution Take 2.5 mg by nebulization 4 (four) times daily.    [name] ELLIPTA 62.5-25 MCG/INH AEPB Inhale 1 puff into the lungs daily.   11  . aspirin EC 81 MG EC tablet Take 1 tablet (81 mg total) by mouth daily.    [name] atorvastatin (LIPITOR) 20 MG tablet Take 20 mg by mouth every evening.     [name] co-enzyme Q-10 30 MG capsule Take 100 mg by mouth daily.    [name] EPINEPHrine (EPIPEN 2-PAK) 0.3 mg/0.3 mL IJ SOAJ injection Inject 0.3 mLs (0.3 mg total) into the muscle once as needed (for severe allergic reaction). CAll 911 immediately if you have to use this medicine 2 Device 1  . fluticasone (FLONASE) 50 MCG/ACT nasal spray Place 2 sprays into both nostrils daily.    . furosemide (LASIX) 20 MG tablet TAKE (1) TABLET BY MOUTH 2 TIMES DAILY AS NEEDED. 60 tablet 6  . gabapentin (NEURONTIN) 100 MG capsule Take 2 capsules (200 mg total) by mouth 3 (three) times daily.    [name] HYDROcodone-acetaminophen (NORCO) 10-325 MG tablet Take 1 tablet by mouth every 6 (six) hours as needed for severe pain. pain    . isosorbide mononitrate (ISMO) 10 MG tablet Take 10 mg by mouth daily.    [name] losartan (COZAAR) 25 MG tablet Take 25 mg by mouth daily.    . metFORMIN (GLUCOPHAGE) 500 MG tablet Take 500 mg by mouth 2 (two) times daily.  10  . metoprolol succinate (TOPROL-XL) 100 MG 24 hr tablet Take 1 tablet (100 mg total) by mouth daily. Take with or immediately following a meal. 90 tablet 3  . nitroGLYCERIN (NITROSTAT) 0.4 MG SL tablet Place 1 tablet (0.4 mg total) under the tongue every 5 (five) minutes as needed for chest pain. 10 tablet 0  . Oxycodone HCl 10 MG TABS Take 10 mg by mouth 4 (four) times daily as needed.    . pantoprazole (PROTONIX) 40 MG tablet Take 40 mg by mouth 2 (two) times daily.    . predniSONE (DELTASONE) 5 MG tablet Take 1 tablet (5 mg total) by mouth daily with breakfast.  100 tablet 1  . primidone (MYSOLINE) 50 MG tablet Take 50 mg by mouth at bedtime.      No current facility-administered medications for this visit.    Allergies as of 07/10/2020 - Review Complete 06/30/2020  Allergen Reaction Noted  . Bee venom Other (See Comments) 12/04/2013  . Tdap [tetanus-diphth-acell pertussis] Other (See Comments) 12/04/2013    Family History  Problem Relation Age of Onset  . Heart attack Mother   . Diabetes Mother   . Hypertension Mother   . Angina Mother   . Diabetes Father   . Hypertension Father   . Heart failure Brother   . Suicidality Sister   . Colon cancer Other        Possibly Dad   . Stroke Maternal Grandmother   . Heart failure Maternal Grandmother   . Diabetes Maternal Grandfather   . Hypertension Daughter   . Hypertension Son     Social History   Socioeconomic History  . Marital status: Married    Spouse name: Not on file  . Number of children: Not on file  . Years of education: Not on file  . Highest education level: Not on file  Occupational History  . Occupation: [employer]    Employer: [employer]  Tobacco Use  . Smoking status: Former Smoker    Packs/day: 1.00    Years: 18.00    Pack years: 18.00    Types: Cigarettes    Start date: 09/17/2003    Quit date: 02/01/2019    Years since quitting: 1.4  . Smokeless tobacco: Never Used  Vaping Use  . Vaping Use: Never used  Substance and Sexual Activity  . Alcohol use: No    Alcohol/week: 0.0 standard drinks  . Drug use: Yes    Types: Marijuana  . Sexual activity: Not Currently    Birth control/protection: Post-menopausal  Other Topics Concern  . Not on file  Social History Narrative  . Not on file   Social Determinants of Health   Financial Resource Strain: Not on file  Food Insecurity: Not on file  Transportation Needs: Not on file  Physical Activity: Not on file  Stress: Not on file  Social Connections: Not on file  Intimate Partner Violence: Not on file     Review of Systems: Gen: Denies any fever, chills, fatigue, weight loss, lack of appetite.  CV: Denies chest pain, heart palpitations, peripheral edema, syncope.  Resp: Denies shortness of breath at rest or with exertion. Denies wheezing or cough.  GI: Denies dysphagia or odynophagia. Denies jaundice, hematemesis, fecal incontinence. GU : Denies urinary burning, urinary frequency, urinary hesitancy MS: Denies joint pain, muscle weakness, cramps, or limitation of movement.  Derm: Denies rash, itching, dry skin Psych: Denies depression, anxiety, memory loss, and confusion Heme: Denies bruising, bleeding, and enlarged  lymph nodes.  Physical Exam: LMP 01/05/2015  General:   Alert and oriented. Pleasant and cooperative. Well-nourished and well-developed.  Head:  Normocephalic and atraumatic. Eyes:  Without icterus, sclera clear and conjunctiva pink.  Ears:  Normal auditory acuity. Nose:  No deformity, discharge,  or lesions. Mouth:  No deformity or lesions, oral mucosa pink.  Neck:  Supple, without mass or thyromegaly. Lungs:  Clear to auscultation bilaterally. No wheezes, rales, or rhonchi. No distress.  Heart:  S1, S2 present without murmurs appreciated.  Abdomen:  +BS, soft, non-tender and non-distended. No HSM noted. No guarding or rebound. No masses appreciated.  Rectal:  Deferred  Msk:  Symmetrical without gross deformities. Normal posture. Pulses:  Normal pulses noted. Extremities:  Without clubbing or edema. Neurologic:  Alert and  oriented x4;  grossly normal neurologically. Skin:  Intact without significant lesions or rashes. Cervical Nodes:  No significant cervical adenopathy. Psych:  Alert and cooperative. Normal mood and affect.

## 2020-07-10 ENCOUNTER — Ambulatory Visit: Payer: Medicare Other | Admitting: Gastroenterology

## 2020-07-27 DIAGNOSIS — G894 Chronic pain syndrome: Secondary | ICD-10-CM | POA: Diagnosis not present

## 2020-07-27 DIAGNOSIS — E1140 Type 2 diabetes mellitus with diabetic neuropathy, unspecified: Secondary | ICD-10-CM | POA: Diagnosis not present

## 2020-08-01 DIAGNOSIS — I10 Essential (primary) hypertension: Secondary | ICD-10-CM | POA: Diagnosis not present

## 2020-08-01 DIAGNOSIS — Z720 Tobacco use: Secondary | ICD-10-CM | POA: Diagnosis not present

## 2020-08-01 DIAGNOSIS — E1140 Type 2 diabetes mellitus with diabetic neuropathy, unspecified: Secondary | ICD-10-CM | POA: Diagnosis not present

## 2020-08-01 DIAGNOSIS — J449 Chronic obstructive pulmonary disease, unspecified: Secondary | ICD-10-CM | POA: Diagnosis not present

## 2020-08-03 ENCOUNTER — Ambulatory Visit: Payer: Medicare Other | Admitting: Physician Assistant

## 2020-08-17 ENCOUNTER — Other Ambulatory Visit: Payer: Self-pay

## 2020-08-17 ENCOUNTER — Emergency Department (HOSPITAL_COMMUNITY): Payer: Medicare Other

## 2020-08-17 ENCOUNTER — Observation Stay (HOSPITAL_COMMUNITY)
Admission: EM | Admit: 2020-08-17 | Discharge: 2020-08-18 | Disposition: A | Discharge status: Home / Self Care | Payer: Medicare Other | Attending: Internal Medicine | Admitting: Internal Medicine

## 2020-08-17 ENCOUNTER — Encounter (HOSPITAL_COMMUNITY): Payer: Self-pay | Admitting: *Deleted

## 2020-08-17 DIAGNOSIS — Z7982 Long term (current) use of aspirin: Secondary | ICD-10-CM | POA: Insufficient documentation

## 2020-08-17 DIAGNOSIS — D72829 Elevated white blood cell count, unspecified: Secondary | ICD-10-CM

## 2020-08-17 DIAGNOSIS — I517 Cardiomegaly: Secondary | ICD-10-CM | POA: Diagnosis not present

## 2020-08-17 DIAGNOSIS — I10 Essential (primary) hypertension: Secondary | ICD-10-CM | POA: Diagnosis not present

## 2020-08-17 DIAGNOSIS — Z79899 Other long term (current) drug therapy: Secondary | ICD-10-CM | POA: Diagnosis not present

## 2020-08-17 DIAGNOSIS — W57XXXA Bitten or stung by nonvenomous insect and other nonvenomous arthropods, initial encounter: Secondary | ICD-10-CM | POA: Insufficient documentation

## 2020-08-17 DIAGNOSIS — Z7984 Long term (current) use of oral hypoglycemic drugs: Secondary | ICD-10-CM | POA: Diagnosis not present

## 2020-08-17 DIAGNOSIS — E1165 Type 2 diabetes mellitus with hyperglycemia: Secondary | ICD-10-CM

## 2020-08-17 DIAGNOSIS — J449 Chronic obstructive pulmonary disease, unspecified: Secondary | ICD-10-CM | POA: Insufficient documentation

## 2020-08-17 DIAGNOSIS — Z87891 Personal history of nicotine dependence: Secondary | ICD-10-CM | POA: Diagnosis not present

## 2020-08-17 DIAGNOSIS — L0390 Cellulitis, unspecified: Secondary | ICD-10-CM | POA: Diagnosis present

## 2020-08-17 DIAGNOSIS — M7989 Other specified soft tissue disorders: Secondary | ICD-10-CM

## 2020-08-17 DIAGNOSIS — E119 Type 2 diabetes mellitus without complications: Secondary | ICD-10-CM | POA: Diagnosis not present

## 2020-08-17 DIAGNOSIS — S80862A Insect bite (nonvenomous), left lower leg, initial encounter: Secondary | ICD-10-CM | POA: Diagnosis not present

## 2020-08-17 DIAGNOSIS — Z20822 Contact with and (suspected) exposure to covid-19: Secondary | ICD-10-CM | POA: Insufficient documentation

## 2020-08-17 DIAGNOSIS — L03116 Cellulitis of left lower limb: Secondary | ICD-10-CM | POA: Diagnosis not present

## 2020-08-17 DIAGNOSIS — S2231XA Fracture of one rib, right side, initial encounter for closed fracture: Secondary | ICD-10-CM | POA: Diagnosis not present

## 2020-08-17 DIAGNOSIS — R609 Edema, unspecified: Secondary | ICD-10-CM | POA: Diagnosis present

## 2020-08-17 DIAGNOSIS — G609 Hereditary and idiopathic neuropathy, unspecified: Secondary | ICD-10-CM

## 2020-08-17 DIAGNOSIS — R600 Localized edema: Secondary | ICD-10-CM | POA: Diagnosis present

## 2020-08-17 LAB — URINALYSIS, ROUTINE W REFLEX MICROSCOPIC
Bacteria, UA: NONE SEEN
Bilirubin Urine: NEGATIVE
Glucose, UA: NEGATIVE mg/dL
Ketones, ur: NEGATIVE mg/dL
Leukocytes,Ua: NEGATIVE
Nitrite: NEGATIVE
Protein, ur: NEGATIVE mg/dL
Specific Gravity, Urine: 1.009 (ref 1.005–1.030)
pH: 8 (ref 5.0–8.0)

## 2020-08-17 LAB — CBC WITH DIFFERENTIAL/PLATELET
Abs Immature Granulocytes: 0.81 10*3/uL — ABNORMAL HIGH (ref 0.00–0.07)
Basophils Absolute: 0.1 10*3/uL (ref 0.0–0.1)
Basophils Relative: 0 %
Eosinophils Absolute: 0.1 10*3/uL (ref 0.0–0.5)
Eosinophils Relative: 1 %
HCT: 40.3 % (ref 36.0–46.0)
Hemoglobin: 12.8 g/dL (ref 12.0–15.0)
Immature Granulocytes: 4 %
Lymphocytes Relative: 11 %
Lymphs Abs: 2.4 10*3/uL (ref 0.7–4.0)
MCH: 32.5 pg (ref 26.0–34.0)
MCHC: 31.8 g/dL (ref 30.0–36.0)
MCV: 102.3 fL — ABNORMAL HIGH (ref 80.0–100.0)
Monocytes Absolute: 1.4 10*3/uL — ABNORMAL HIGH (ref 0.1–1.0)
Monocytes Relative: 6 %
Neutro Abs: 17.6 10*3/uL — ABNORMAL HIGH (ref 1.7–7.7)
Neutrophils Relative %: 78 %
Platelets: 389 10*3/uL (ref 150–400)
RBC: 3.94 MIL/uL (ref 3.87–5.11)
RDW: 13.2 % (ref 11.5–15.5)
WBC: 22.3 10*3/uL — ABNORMAL HIGH (ref 4.0–10.5)
nRBC: 0 % (ref 0.0–0.2)

## 2020-08-17 LAB — COMPREHENSIVE METABOLIC PANEL
ALT: 30 U/L (ref 0–44)
AST: 20 U/L (ref 15–41)
Albumin: 3.7 g/dL (ref 3.5–5.0)
Alkaline Phosphatase: 59 U/L (ref 38–126)
Anion gap: 10 (ref 5–15)
BUN: 24 mg/dL — ABNORMAL HIGH (ref 6–20)
CO2: 25 mmol/L (ref 22–32)
Calcium: 9.6 mg/dL (ref 8.9–10.3)
Chloride: 101 mmol/L (ref 98–111)
Creatinine, Ser: 0.93 mg/dL (ref 0.44–1.00)
GFR, Estimated: >60 mL/min (ref >60)
Glucose, Bld: 200 mg/dL — ABNORMAL HIGH (ref 70–99)
Potassium: 4.2 mmol/L (ref 3.5–5.1)
Sodium: 136 mmol/L (ref 135–145)
Total Bilirubin: 0.5 mg/dL (ref 0.3–1.2)
Total Protein: 6.8 g/dL (ref 6.5–8.1)

## 2020-08-17 LAB — LACTIC ACID, PLASMA
Lactic Acid, Venous: 2.4 mmol/L (ref 0.5–1.9)
Lactic Acid, Venous: 1.8 mmol/L (ref 0.5–1.9)

## 2020-08-17 LAB — CBG MONITORING, ED: Glucose-Capillary: 159 mg/dL — ABNORMAL HIGH (ref 70–99)

## 2020-08-17 LAB — D-DIMER, QUANTITATIVE: D-Dimer, Quant: 1.04 ug/mL-FEU — ABNORMAL HIGH (ref 0.00–0.50)

## 2020-08-17 MED ORDER — SODIUM CHLORIDE 0.9 % IV SOLN
100.0000 mg | Freq: Once | INTRAVENOUS | Status: AC
  Administered 2020-08-17: 100 mg via INTRAVENOUS
  Filled 2020-08-17 (×2): qty 100

## 2020-08-17 MED ORDER — ENOXAPARIN SODIUM 100 MG/ML IJ SOSY
1.0000 mg/kg | PREFILLED_SYRINGE | Freq: Once | INTRAMUSCULAR | Status: AC
  Administered 2020-08-17: 92.5 mg via SUBCUTANEOUS
  Filled 2020-08-17: qty 1

## 2020-08-17 NOTE — ED Provider Notes (Addendum)
Emergency Medicine Provider Triage Evaluation Note  Gina Bradley , a 60 y.o. female  was evaluated in triage.  Pt complains of shoulder pain and clots in legs.  Review of Systems  Positive: No SOB or CP Negative: No weakness  Physical Exam  BP 120/78 (BP Location: Right Arm)   Pulse 80   Temp 98.3 F (36.8 C) (Oral)   Resp 16   Ht 5\' 1"  (1.549 m)   Wt 92.1 kg   LMP 01/05/2015   SpO2 97%   BMI 38.36 kg/m  Gen:   Awake, no distress , sitting comfortably in chair. Moves easily, normal gait Resp:  Normal effort , no respiratory distress MSK:   Moves extremities without difficulty  Other:  comfortable  Medical Decision Making  Medically screening exam initiated at 3:17 PM.  Appropriate orders placed.  MARIAVICTORIA NOTTINGHAM was informed that the remainder of the evaluation will be completed by another provider, this initial triage assessment does not replace that evaluation, and the importance of remaining in the ED until their evaluation is complete.   She wants to leave, "because my family needs me." Nurse will have her sign AMA.   [name], MD 08/17/20 1515  3:20 PM. She now wants to stay. Will order evaluation.     08/19/20, MD 08/17/20 1537

## 2020-08-17 NOTE — ED Notes (Signed)
Pt has complained about waiting in the vertical area.  EDP Effie Shy stated that pt was leaving AMA.  Asked pt to sign AMA form and pt then decides to stay.

## 2020-08-17 NOTE — ED Triage Notes (Signed)
Pt with throbbing to left arm down for a week.  Pt with recent abscess to left upper back.  Pt states pain starts to mid upper back that radiates up to neck and then across to left shoulder down.

## 2020-08-17 NOTE — ED Notes (Signed)
critical lactic acid of 2.4 reported to edp wentz at this time.

## 2020-08-17 NOTE — ED Notes (Signed)
Pt removed a tick from left leg 3-4 days ago.

## 2020-08-17 NOTE — ED Provider Notes (Signed)
[facility] EMERGENCY DEPARTMENT Provider Note   CSN: [id number] Arrival date & time: 08/17/20  1410     History Chief Complaint  Patient presents with  . Shoulder Pain    Gina Bradley is a 60 y.o. female.  HPI She presents for multiple complaints including shoulder and leg discomfort.  She reportedly removed a tick from her left leg, 3 to 4 days ago.  She states that she had an abscess of her upper back, recently.  She said that it burst, and drained some pus.  She complains of bilateral lower extremity swelling, and bruising that makes her concerned about blood clot.  She denies fever, vomiting or dizziness.  She denies prior history of venous thromboembolism.  She states that she recently had evaluation by cardiology and an echocardiogram.    Past Medical History:  Diagnosis Date  . Anemia   . Cervical disc disease   . COPD (chronic obstructive pulmonary disease) (HCC)   . Diabetes mellitus without complication (HCC)   . Essential hypertension   . GERD (gastroesophageal reflux disease)   . Heart failure (HCC) 2018  . History of cardiac catheterization    Normal coronaries March 2015  . History of non-ST elevation myocardial infarction (NSTEMI)    Secondary to SVT  . Lymphedema   . Peripheral neuropathy   . Spinal stenosis   . SVT (supraventricular tachycardia) [facility])     Patient Active Problem List   Diagnosis Date Noted  . Acute psychosis (HCC)   . Hypokalemia   . Class 1 obesity due to excess calories with body mass index (BMI) of 33.0 to 33.9 in adult   . Cellulitis of lower extremity   . AMS (altered mental status) 06/27/2019  . Acidosis, metabolic 06/27/2019  . PMB (postmenopausal bleeding) 10/06/2016  . Chest pressure 01/05/2015  . Gastric erosion   . Encounter for screening colonoscopy 01/22/2014  . Constipation 01/20/2014  . GERD (gastroesophageal reflux disease) 01/20/2014  . Dyspepsia 01/20/2014  . SVT (supraventricular tachycardia) (HCC)  05/31/2013  . HTN (hypertension), benign 05/31/2013  . Hypercholesteremia 05/31/2013  . COPD  GOLD 0/ atypical insp portion of f/v curve  05/31/2013  . Cervical stenosis of spine 05/31/2013  . Peripheral neuropathy 05/31/2013    Past Surgical History:  Procedure Laterality Date  . CESAREAN SECTION    . ESOPHAGOGASTRODUODENOSCOPY N/A 02/10/2014   QQP:YPPJKD tiny antral erosion; otherwise normal EGD. No explanation for patient's symptoms. Gallbladder needs further evaluation.  [name] LEFT HEART CATHETERIZATION WITH CORONARY ANGIOGRAM N/A 06/03/2013   Procedure: LEFT HEART CATHETERIZATION WITH CORONARY ANGIOGRAM;  Surgeon: Lesleigh Noe, MD;  Location: [facility] CATH LAB;  Service: Cardiovascular;  Laterality: N/A;     OB History    Gravida  2   Para  2   Term  2   Preterm      AB      Living  2     SAB      IAB      Ectopic      Multiple      Live Births  2           Family History  Problem Relation Age of Onset  . Heart attack Mother   . Diabetes Mother   . Hypertension Mother   . Angina Mother   . Diabetes Father   . Hypertension Father   . Heart failure Brother   . Suicidality Sister   . Colon cancer Other  Possibly Dad   . Stroke Maternal Grandmother   . Heart failure Maternal Grandmother   . Diabetes Maternal Grandfather   . Hypertension Daughter   . Hypertension Son     Social History   Tobacco Use  . Smoking status: Former Smoker    Packs/day: 1.00    Years: 18.00    Pack years: 18.00    Types: Cigarettes    Start date: 09/17/2003    Quit date: 02/01/2019    Years since quitting: 1.5  . Smokeless tobacco: Never Used  Vaping Use  . Vaping Use: Never used  Substance Use Topics  . Alcohol use: No    Alcohol/week: 0.0 standard drinks  . Drug use: Yes    Types: Marijuana    Home Medications Prior to Admission medications   Medication Sig Start Date End Date Taking? Authorizing Provider  albuterol (PROVENTIL HFA;VENTOLIN HFA) 108 (90  Base) MCG/ACT inhaler Inhale 1 puff into the lungs every 6 (six) hours as needed for wheezing or shortness of breath.   Yes [provider]  albuterol (PROVENTIL) (2.5 MG/3ML) 0.083% nebulizer solution Take 2.5 mg by nebulization daily as needed.   Yes [provider]  ANORO ELLIPTA 62.5-25 MCG/INH AEPB Inhale 1 puff into the lungs daily.  09/09/16  Yes [provider]  aspirin EC 81 MG EC tablet Take 1 tablet (81 mg total) by mouth daily. 06/03/13  Yes Lonia Blood, MD  atorvastatin (LIPITOR) 20 MG tablet Take 20 mg by mouth every morning. 03/04/13  Yes [provider]  co-enzyme Q-10 30 MG capsule Take 100 mg by mouth daily.   Yes [provider]  cyclobenzaprine (FLEXERIL) 5 MG tablet Take 1 tablet by mouth daily as needed for muscle pain. 05/11/20  Yes [provider]  EPINEPHrine (EPIPEN 2-PAK) 0.3 mg/0.3 mL IJ SOAJ injection Inject 0.3 mLs (0.3 mg total) into the muscle once as needed (for severe allergic reaction). CAll 911 immediately if you have to use this medicine 10/21/15  Yes Eber Hong, MD  fluticasone [facility]) 50 MCG/ACT nasal spray Place 2 sprays into both nostrils daily.   Yes [provider]  furosemide (LASIX) 20 MG tablet TAKE (1) TABLET BY MOUTH 2 TIMES DAILY AS NEEDED. Patient taking differently: Take 20 mg by mouth daily. 08/23/19  Yes Marinus Maw, MD  gabapentin (NEURONTIN) 100 MG capsule Take 2 capsules (200 mg total) by mouth 3 (three) times daily. 07/04/19  Yes Vassie Loll, MD  HYDROcodone-acetaminophen [facility]) 10-325 MG tablet Take 1 tablet by mouth every 6 (six) hours as needed for severe pain. pain 07/04/19  Yes Vassie Loll, MD  isosorbide mononitrate (ISMO) 10 MG tablet Take 10 mg by mouth daily.   Yes [provider]  losartan (COZAAR) 25 MG tablet Take 25 mg by mouth daily.   Yes [provider]  metFORMIN (GLUCOPHAGE) 500 MG tablet Take 500 mg by mouth daily. 11/30/16  Yes [provider]  metoprolol succinate (TOPROL-XL) 100 MG 24 hr tablet Take 1 tablet (100 mg total) by mouth daily. Take with or immediately following a meal. 06/30/20 09/28/20 Yes Dyann Kief, PA-C  nitroGLYCERIN (NITROSTAT) 0.4 MG SL tablet Place 1 tablet (0.4 mg total) under the tongue every 5 (five) minutes as needed for chest pain. 12/22/14  Yes Rolland Porter, MD  Oxycodone HCl 10 MG TABS Take 10 mg by mouth 4 (four) times daily as needed. 09/10/19  Yes [provider]  pantoprazole (PROTONIX) 40 MG  tablet Take 40 mg by mouth every morning. 05/25/13  Yes [provider]  predniSONE (DELTASONE) 5 MG tablet Take 1 tablet (5 mg total) by mouth daily with breakfast. 10/22/19  Yes Nyoka Cowden, MD  primidone (MYSOLINE) 50 MG tablet Take 50 mg by mouth at bedtime.  04/06/19  Yes [provider]    Allergies    Bee venom and Tdap [tetanus-diphth-acell pertussis]  Review of Systems   Review of Systems  All other systems reviewed and are negative.   Physical Exam Updated Vital Signs BP (!) 111/55   Pulse 78   Temp 98.1 F (36.7 C)   Resp 20   Ht 5\' 1"  (1.549 m)   Wt 92.1 kg   LMP 01/05/2015   SpO2 97%   BMI 38.36 kg/m   Physical Exam Vitals and nursing note reviewed.  Constitutional:      General: She is not in acute distress.    Appearance: She is well-developed. She is obese. She is not ill-appearing, toxic-appearing or diaphoretic.  HENT:     Head: Normocephalic and atraumatic.     Right Ear: External ear normal.     Left Ear: External ear normal.  Eyes:     Conjunctiva/sclera: Conjunctivae normal.     Pupils: Pupils are equal, round, and reactive to light.  Neck:     Trachea: Phonation normal.  Cardiovascular:     Rate and Rhythm: Normal rate and regular rhythm.     Heart sounds: Normal heart sounds.  Pulmonary:     Effort: Pulmonary effort is normal.     Breath sounds: Normal breath sounds.  Abdominal:     Palpations: Abdomen is soft.      Tenderness: There is no abdominal tenderness.  Musculoskeletal:        General: Normal range of motion.     Cervical back: Normal range of motion and neck supple.     Comments: No popliteal mass or tenderness.  3-4+ pitting edema bilateral lower legs.  Skin:    General: Skin is warm and dry.     Comments: Bruising on bilateral anterior lower legs.  This is nonspecific.  No visible tick bite mark on left leg behind knee where she states she got bit.  Multiple healing excoriations, bilateral forearms and a healing wound of her upper back consistent with healing local carbuncle.  Neurological:     Mental Status: She is alert and oriented to person, place, and time.     Cranial Nerves: No cranial nerve deficit.     Sensory: No sensory deficit.     Motor: No abnormal muscle tone.     Coordination: Coordination normal.  Psychiatric:        Mood and Affect: Mood normal.        Behavior: Behavior normal.        Thought Content: Thought content normal.        Judgment: Judgment normal.     ED Results / Procedures / Treatments   Labs (all labs ordered are listed, but only abnormal results are displayed) Labs Reviewed  COMPREHENSIVE METABOLIC PANEL - Abnormal; Notable for the following components:      Result Value   Glucose, Bld 200 (*)    BUN 24 (*)    All other components within normal limits  CBC WITH DIFFERENTIAL/PLATELET - Abnormal; Notable for the following components:   WBC 22.3 (*)    MCV 102.3 (*)    Neutro Abs 17.6 (*)  Monocytes Absolute 1.4 (*)    Abs Immature Granulocytes 0.81 (*)    All other components within normal limits  URINALYSIS, ROUTINE W REFLEX MICROSCOPIC - Abnormal; Notable for the following components:   Color, Urine STRAW (*)    Hgb urine dipstick LARGE (*)    All other components within normal limits  D-DIMER, QUANTITATIVE - Abnormal; Notable for the following components:   D-Dimer, Quant 1.04 (*)    All other components within normal limits  CBG  MONITORING, ED - Abnormal; Notable for the following components:   Glucose-Capillary 159 (*)    All other components within normal limits  CULTURE, BLOOD (ROUTINE X 2)  CULTURE, BLOOD (ROUTINE X 2)  SARS CORONAVIRUS 2 (TAT 6-24 HRS)  ROCKY MTN SPOTTED FVR ABS PNL(IGG+IGM)  B. BURGDORFI ANTIBODIES  LACTIC ACID, PLASMA  LACTIC ACID, PLASMA    EKG EKG Interpretation  Date/Time:  Monday Aug 17 2020 16:41:57 EDT Ventricular Rate:  72 PR Interval:  164 QRS Duration: 76 QT Interval:  390 QTC Calculation: 427 R Axis:   41 Text Interpretation: Normal sinus rhythm Cannot rule out Inferior infarct , age undetermined Cannot rule out Anterior infarct , age undetermined Abnormal ECG Since last tracing of earlier today No significant change was found Confirmed by Mancel Bale [phone]) on 08/17/2020 5:05:16 PM   Radiology DG Chest 2 View  Result Date: 08/17/2020 CLINICAL DATA:  Shoulder and back pain.  COVID-19 negative EXAM: CHEST - 2 VIEW COMPARISON:  06/26/2019 and back to 11/07/2016. FINDINGS: Lateral view degraded by patient arm position. Midline trachea. Mild cardiomegaly. Prominent epicardial fat pad. No pleural effusion or pneumothorax. Mild hyperinflation. At least 1 upper right rib fracture. Subtle foci of increased density project over the fifth and sixth posterior left ribs. Not readily apparent on the prior. IMPRESSION: Densities projecting over posterior upper left ribs. Although these could represent healing fractures, given contralateral rib fracture or fractures, pulmonary nodules cannot be excluded. Consider further evaluation with chest CT. Otherwise, no acute findings. Hyperinflation, suggesting COPD. Electronically Signed   By: Jeronimo Greaves M.D.   On: 08/17/2020 16:48    Procedures Procedures   Medications Ordered in ED Medications  doxycycline (VIBRAMYCIN) 100 mg in sodium chloride 0.9 % 250 mL IVPB (has no administration in time range)  enoxaparin (LOVENOX) injection 92.5  mg (has no administration in time range)    ED Course  I have reviewed the triage vital signs and the nursing notes.  Pertinent labs & imaging results that were available during my care of the patient were reviewed by me and considered in my medical decision making (see chart for details).  Clinical Course as of 08/17/20 2030  Mon Aug 17, 2020  2012 WBC, UA: 0-5 [EW]    Clinical Course User Index [EW] Mancel Bale, MD   MDM Rules/Calculators/A&P                           Patient Vitals for the past 24 hrs:  BP Temp Temp src Pulse Resp SpO2 Height Weight  08/17/20 1830 (!) 111/55 -- -- 78 20 97 % -- --  08/17/20 1645 104/61 98.1 F (36.7 C) -- 72 16 95 % -- --  08/17/20 1440 -- -- -- -- -- -- 5\' 1"  (1.549 m) 92.1 kg  08/17/20 1438 120/78 98.3 F (36.8 C) Oral 80 16 97 % -- --    9:28 PM Reevaluation with update and discussion. After initial assessment  and treatment, an updated evaluation reveals she is fairly comfortable.  She is agreeable to admission. Mancel BaleElliott Blanchard [place]   Medical Decision Making:  This patient is presenting for evaluation of nonspecific symptom, which does require a range of treatment options, and is a complaint that involves a moderate risk of morbidity and mortality. The differential diagnoses include acute infection, venous thromboembolism, metabolic disorder, pulmonary and cardiac disorder. I decided to review old records, and in summary obese middle-aged female presenting with nonspecific symptoms.  I did not require additional historical information from anyone.  Clinical Laboratory Tests Ordered, included CBC, Metabolic panel, Urinalysis and D-dimer. Review indicates normal except glucose high, BUN high, white count high, MCV high. Radiologic Tests Ordered, included chest x-ray.  I independently Visualized: Radiograph images, which show no infiltrate or edema.  Per radiologist, nonspecific posterior rib region abnormalities possibly nodular versus healing  fractures.   Critical Interventions-clinical evaluation, laboratory testing, chest x-ray, observation reassessment  After These Interventions, the Patient was reevaluated and was found with nonspecific symptoms, elevated white count, and history of tick bite.  D-dimer elevated, unable to perform CT angiogram at this time, for PE; secondary to resource limitation.  Venous Dopplers not available at this time.  We will seek hospitalization for further evaluation and intervention, expectantly.  Anticipate lower extremity Dopplers tomorrow, and VQ scan tomorrow.  Lovenox started today.  Coverage started to treat possible tickborne disease following obtaining blood cultures and lactate.  Patient leg swelling nonspecific.  Cardiac echo done 2 months ago as elevated EF of greater than 75%, with hyperdynamic LV function  CRITICAL CARE-yes Performed by: Mancel BaleElliott Marlean Mortell  Nursing Notes Reviewed/ Care Coordinated Applicable Imaging Reviewed Interpretation of Laboratory Data incorporated into ED treatment   8:28 PM-Consult complete with hospital. Patient case explained and discussed.  She agrees to admit patient for further evaluation and treatment. Call ended at 9:29 PM    Final Clinical Impression(s) / ED Diagnoses Final diagnoses:  Tick bite of left lower leg, initial encounter  Leukocytosis, unspecified type  Leg swelling    Rx / DC Orders ED Discharge Orders    None       Mancel BaleWentz, Anush Wiedeman, MD 08/17/20 2130

## 2020-08-18 ENCOUNTER — Observation Stay (HOSPITAL_COMMUNITY): Payer: Medicare Other

## 2020-08-18 ENCOUNTER — Observation Stay (HOSPITAL_BASED_OUTPATIENT_CLINIC_OR_DEPARTMENT_OTHER): Payer: Medicare Other

## 2020-08-18 DIAGNOSIS — S80862A Insect bite (nonvenomous), left lower leg, initial encounter: Secondary | ICD-10-CM | POA: Diagnosis not present

## 2020-08-18 DIAGNOSIS — L03312 Cellulitis of back [any part except buttock]: Secondary | ICD-10-CM

## 2020-08-18 DIAGNOSIS — R609 Edema, unspecified: Secondary | ICD-10-CM | POA: Diagnosis not present

## 2020-08-18 DIAGNOSIS — R0602 Shortness of breath: Secondary | ICD-10-CM | POA: Diagnosis not present

## 2020-08-18 DIAGNOSIS — W57XXXA Bitten or stung by nonvenomous insect and other nonvenomous arthropods, initial encounter: Secondary | ICD-10-CM | POA: Diagnosis not present

## 2020-08-18 DIAGNOSIS — I509 Heart failure, unspecified: Secondary | ICD-10-CM | POA: Diagnosis not present

## 2020-08-18 DIAGNOSIS — E1165 Type 2 diabetes mellitus with hyperglycemia: Secondary | ICD-10-CM | POA: Diagnosis not present

## 2020-08-18 DIAGNOSIS — M7989 Other specified soft tissue disorders: Secondary | ICD-10-CM | POA: Diagnosis not present

## 2020-08-18 DIAGNOSIS — E119 Type 2 diabetes mellitus without complications: Secondary | ICD-10-CM

## 2020-08-18 LAB — COMPREHENSIVE METABOLIC PANEL
ALT: 31 U/L (ref 0–44)
AST: 20 U/L (ref 15–41)
Albumin: 3.9 g/dL (ref 3.5–5.0)
Alkaline Phosphatase: 67 U/L (ref 38–126)
Anion gap: 13 (ref 5–15)
BUN: 19 mg/dL (ref 6–20)
CO2: 34 mmol/L — ABNORMAL HIGH (ref 22–32)
Calcium: 10 mg/dL (ref 8.9–10.3)
Chloride: 94 mmol/L — ABNORMAL LOW (ref 98–111)
Creatinine, Ser: 0.78 mg/dL (ref 0.44–1.00)
GFR, Estimated: >60 mL/min (ref >60)
Glucose, Bld: 111 mg/dL — ABNORMAL HIGH (ref 70–99)
Potassium: 4 mmol/L (ref 3.5–5.1)
Sodium: 141 mmol/L (ref 135–145)
Total Bilirubin: 0.5 mg/dL (ref 0.3–1.2)
Total Protein: 7.4 g/dL (ref 6.5–8.1)

## 2020-08-18 LAB — CBC
HCT: 43.7 % (ref 36.0–46.0)
Hemoglobin: 13.9 g/dL (ref 12.0–15.0)
MCH: 32.2 pg (ref 26.0–34.0)
MCHC: 31.8 g/dL (ref 30.0–36.0)
MCV: 101.2 fL — ABNORMAL HIGH (ref 80.0–100.0)
Platelets: 426 10*3/uL — ABNORMAL HIGH (ref 150–400)
RBC: 4.32 MIL/uL (ref 3.87–5.11)
RDW: 13.1 % (ref 11.5–15.5)
WBC: 20.2 10*3/uL — ABNORMAL HIGH (ref 4.0–10.5)
nRBC: 0 % (ref 0.0–0.2)

## 2020-08-18 LAB — GLUCOSE, CAPILLARY
Glucose-Capillary: 146 mg/dL — ABNORMAL HIGH (ref 70–99)
Glucose-Capillary: 115 mg/dL — ABNORMAL HIGH (ref 70–99)
Glucose-Capillary: 133 mg/dL — ABNORMAL HIGH (ref 70–99)

## 2020-08-18 LAB — MAGNESIUM: Magnesium: 1.9 mg/dL (ref 1.7–2.4)

## 2020-08-18 LAB — HIV ANTIBODY (ROUTINE TESTING W REFLEX): HIV Screen 4th Generation wRfx: NONREACTIVE

## 2020-08-18 LAB — HEMOGLOBIN A1C
Hgb A1c MFr Bld: 7.3 % — ABNORMAL HIGH (ref 4.8–5.6)
Mean Plasma Glucose: 162.81 mg/dL

## 2020-08-18 LAB — SARS CORONAVIRUS 2 (TAT 6-24 HRS): SARS Coronavirus 2: NEGATIVE

## 2020-08-18 MED ORDER — OXYCODONE HCL 5 MG PO TABS
5.0000 mg | ORAL_TABLET | ORAL | Status: DC | PRN
  Administered 2020-08-18 (×2): 5 mg via ORAL
  Filled 2020-08-18 (×2): qty 1

## 2020-08-18 MED ORDER — ONDANSETRON HCL 4 MG/2ML IJ SOLN
4.0000 mg | Freq: Four times a day (QID) | INTRAMUSCULAR | Status: DC | PRN
  Administered 2020-08-18: 4 mg via INTRAVENOUS
  Filled 2020-08-18: qty 2

## 2020-08-18 MED ORDER — METOPROLOL SUCCINATE ER 50 MG PO TB24
100.0000 mg | ORAL_TABLET | Freq: Every day | ORAL | Status: DC
  Administered 2020-08-18: 100 mg via ORAL
  Filled 2020-08-18: qty 2

## 2020-08-18 MED ORDER — ACETAMINOPHEN 325 MG PO TABS: 650.0000 mg | ORAL_TABLET | Freq: Four times a day (QID) | ORAL | Status: DC | PRN

## 2020-08-18 MED ORDER — GABAPENTIN 100 MG PO CAPS
200.0000 mg | ORAL_CAPSULE | Freq: Three times a day (TID) | ORAL | Status: DC
  Administered 2020-08-18 (×2): 200 mg via ORAL
  Filled 2020-08-18 (×2): qty 2

## 2020-08-18 MED ORDER — FUROSEMIDE 20 MG PO TABS
20.0000 mg | ORAL_TABLET | Freq: Every day | ORAL | Status: DC
  Administered 2020-08-18: 20 mg via ORAL
  Filled 2020-08-18: qty 1

## 2020-08-18 MED ORDER — UMECLIDINIUM-VILANTEROL 62.5-25 MCG/INH IN AEPB
1.0000 | INHALATION_SPRAY | Freq: Every day | RESPIRATORY_TRACT | Status: DC
  Administered 2020-08-18: 1 via RESPIRATORY_TRACT
  Filled 2020-08-18: qty 14

## 2020-08-18 MED ORDER — ALBUTEROL SULFATE (2.5 MG/3ML) 0.083% IN NEBU: 2.5000 mg | INHALATION_SOLUTION | RESPIRATORY_TRACT | Status: DC | PRN

## 2020-08-18 MED ORDER — INSULIN ASPART 100 UNIT/ML IJ SOLN
0.0000 [IU] | Freq: Three times a day (TID) | INTRAMUSCULAR | Status: DC
  Administered 2020-08-18: 2 [IU] via SUBCUTANEOUS

## 2020-08-18 MED ORDER — HYDROCODONE-ACETAMINOPHEN 10-325 MG PO TABS
1.0000 | ORAL_TABLET | Freq: Four times a day (QID) | ORAL | Status: DC | PRN
  Administered 2020-08-18 (×2): 1 via ORAL
  Filled 2020-08-18 (×2): qty 1

## 2020-08-18 MED ORDER — LIVING BETTER WITH HEART FAILURE BOOK: Freq: Once | Status: AC

## 2020-08-18 MED ORDER — PANTOPRAZOLE SODIUM 40 MG PO TBEC
40.0000 mg | DELAYED_RELEASE_TABLET | Freq: Every morning | ORAL | Status: DC
  Administered 2020-08-18: 40 mg via ORAL
  Filled 2020-08-18: qty 1

## 2020-08-18 MED ORDER — SODIUM CHLORIDE 0.9 % IV SOLN
INTRAVENOUS | Status: DC | PRN
  Administered 2020-08-18: 250 mL via INTRAVENOUS

## 2020-08-18 MED ORDER — ACETAMINOPHEN 650 MG RE SUPP: 650.0000 mg | Freq: Four times a day (QID) | RECTAL | Status: DC | PRN

## 2020-08-18 MED ORDER — ISOSORBIDE MONONITRATE 20 MG PO TABS
10.0000 mg | ORAL_TABLET | Freq: Every day | ORAL | Status: DC
  Administered 2020-08-18: 10 mg via ORAL
  Filled 2020-08-18 (×2): qty 1

## 2020-08-18 MED ORDER — ONDANSETRON HCL 4 MG PO TABS: 4.0000 mg | ORAL_TABLET | Freq: Four times a day (QID) | ORAL | Status: DC | PRN

## 2020-08-18 MED ORDER — ASPIRIN EC 81 MG PO TBEC
81.0000 mg | DELAYED_RELEASE_TABLET | Freq: Every day | ORAL | Status: DC
  Administered 2020-08-18: 81 mg via ORAL
  Filled 2020-08-18: qty 1

## 2020-08-18 MED ORDER — TECHNETIUM TO 99M ALBUMIN AGGREGATED
4.4000 | Freq: Once | INTRAVENOUS | Status: AC | PRN
  Administered 2020-08-18: 4.4 via INTRAVENOUS

## 2020-08-18 MED ORDER — DOXYCYCLINE HYCLATE 100 MG PO TABS: 100.0000 mg | ORAL_TABLET | Freq: Two times a day (BID) | ORAL | 0 refills | Status: AC

## 2020-08-18 MED ORDER — INSULIN ASPART 100 UNIT/ML IJ SOLN: 0.0000 [IU] | Freq: Every day | INTRAMUSCULAR | Status: DC

## 2020-08-18 MED ORDER — LOSARTAN POTASSIUM 50 MG PO TABS
25.0000 mg | ORAL_TABLET | Freq: Every day | ORAL | Status: DC
  Administered 2020-08-18: 25 mg via ORAL
  Filled 2020-08-18 (×2): qty 1

## 2020-08-18 MED ORDER — SODIUM CHLORIDE 0.9 % IV SOLN
100.0000 mg | Freq: Two times a day (BID) | INTRAVENOUS | Status: DC
  Administered 2020-08-18: 100 mg via INTRAVENOUS
  Filled 2020-08-18 (×3): qty 100

## 2020-08-18 MED ORDER — FUROSEMIDE 10 MG/ML IJ SOLN
40.0000 mg | Freq: Once | INTRAMUSCULAR | Status: AC
  Administered 2020-08-18: 40 mg via INTRAVENOUS
  Filled 2020-08-18: qty 4

## 2020-08-18 MED ORDER — ENOXAPARIN SODIUM 100 MG/ML IJ SOSY
90.0000 mg | PREFILLED_SYRINGE | Freq: Two times a day (BID) | INTRAMUSCULAR | Status: DC
  Administered 2020-08-18: 90 mg via SUBCUTANEOUS
  Filled 2020-08-18: qty 1

## 2020-08-18 MED ORDER — ATORVASTATIN CALCIUM 20 MG PO TABS
20.0000 mg | ORAL_TABLET | Freq: Every morning | ORAL | Status: DC
  Administered 2020-08-18: 20 mg via ORAL
  Filled 2020-08-18: qty 1

## 2020-08-18 NOTE — TOC Progression Note (Signed)
Transition of Care [facility]) - Progression Note    Patient Details  Name: Gina Bradley MRN: [id number] Date of Birth: 05-13-1960  [facility] CM/SW Contact  Leitha Bleak, RN Phone Number: 08/18/2020, 3:42 PM  Clinical Narrative:   Patient discharging home, family at the bedside. Follow up PCP/Cardiology appointments made. CHF book given, patient to review a make a list of questions to ask  Doctors. Patient needs another appointment for outpatient lymphemia specialist. Area on her upper back causing pain and she can not reach. Order placed for MD to sign. OBS given.     Barriers to Discharge: Barriers Resolved  Expected Discharge Plan and Services      Expected Discharge Date: 08/18/20

## 2020-08-18 NOTE — Discharge Summary (Signed)
Physician Discharge Summary  RAND BOLLER ZOX:[id number] DOB: Sep 14, 1960 DOA: 08/17/2020  PCP: Elfredia Nevins, MD  Admit date: 08/17/2020 Discharge date: 08/18/2020  Time spent: 35 minutes  Recommendations for Outpatient Follow-up:  1. Repeat basic metabolic panel to follow lites renal function 2. Repeat CBC to follow hemoglobin and WBCs instability. 3. Reassess patient in between shoulder blades skin for complete resolution of cellulitic/folliculitis process. 4. Follow-up on patient's improvement/resolution base of the neck pain with right upper extremity numbness; if needed she will require follow-up with neurosurgery.   Discharge Diagnoses:  Active Problems:   Cellulitis   Peripheral edema   Diabetes mellitus (HCC)   Tick bite of left lower leg Hypertension History of COPD History of SVT History of lymphedema Gastroesophageal reflux disease Spinal stenosis  Discharge Condition: Stable and improved.  Discharge home with instruction to follow-up with PCP in 10 days.  CODE STATUS: Full code  Diet recommendation: Heart healthy and modify carbohydrates diet  Filed Weights   08/17/20 1440 08/17/20 2312 08/18/20 0554  Weight: 92.1 kg 93.6 kg 92.7 kg    History of present illness:  As per H&P written by Dr.Zierle-Ghosh on 08/18/20 Gina Bradley  is a 60 y.o. female, with history of SVT, peripheral neuropathy, coronary artery disease, heart failure, GERD, hypertension, diabetes mellitus type 2, COPD and more presents the ED with chief complaint of back pain.  Patient reports that she has had pain between her shoulder blades, radiating over to her left shoulder blade and down her left arm.  She reports that sharp and throbbing pain.  She reports it is worse when she has positional changes of her head or neck, and when she lays on it.  She describes the pain as severe and intermittent.  She does have a history of spinal stenosis, and cervical spine pathology as well.  She does have  numbness in the left hand, but reports that that is been ongoing for many years secondary to carpal tunnel syndrome.  This new pain has been present for 4 or 5 days.  She is also concerned about swelling in her legs.  She reports that her father had DVTs and she knows that that she has 2.  She is a difficult historian, reporting that she has had "several blood clots in the past."  When further questioned she reports that she never saw a doctor for them and she only took vinegar and apple cider to make them go away.  She does report that wearing compression hose that helps them as well.  The bilateral peripheral edema that she is reporting now has been present for 4 to 5 days.  She reports that she takes Lasix at home, but she does not take it the way she is supposed to.  She is supposed to take it twice a day every day, she only takes it once a day due to the inconvenience.  Patient has no other complaints at this time.  Patient is a current smoker, does not drink, does not use illicit drugs, is not vaccinated for COVID.  Patient would.  In the ED Temp 98.1, heart rate 72, respiratory rate 14, blood pressure 129/81, satting at 97% Leukocytosis with white blood cell count of 22.3, hemoglobin 12.8 Chemistry panel is mostly unremarkable with a hyperglycemia of 200 UA is borderline, urine culture pending Patient was started on therapeutic Lovenox after positive D-dimer 1.04 VQ scan already ordered for the a.m. Lactic acid initially 2.4, improved to 1.8 Patient was also started  on doxycycline for possible cellulitis Admission requested to observe patient until VQ scan can be done in the setting of contrast shortage  Hospital Course:  1-peripheral edema (chronic lymphedema) slightly worse as per patient reports -Low-sodium diet education has been provided -Continue home dose of Lasix regimen -Advised to maintain leg elevated -Follow-up with PCP for referral to lymphedema  clinic.  2-cellulitis/folliculitis -Patient without fever and with normal WBCs -Started on antibiotics and discharge to complete oral therapy. -Warm compresses to affected area at least 3 times a day has been recommended -Continue as needed analgesics  3-elevated D-dimer -Patient reports no anterior or pleuritic chest discomfort -No requiring oxygen supplementation -VQ scan negative for PE suspicious and patient has bilateral lower extremity negative for DVT.  4-gastroesophageal reflux disease -Continue PPI  5-type 2 diabetes -Resume home hypoglycemic regimen -Advised to follow modified carbohydrate diet. -Follow-up with PCP to further adjust treatment as needed.  6-lactic acidosis -Improving/resolved with fluid resuscitation at discharge patient's lactic acid was 1.8. -Advised to maintain adequate hydration  Procedures:  See below for x-ray reports  Consultations:  None  Discharge Exam: Vitals:   08/18/20 0340 08/18/20 0834  BP: 133/76   Pulse: 69 71  Resp: 20 18  Temp: 97.8 F (36.6 C)   SpO2: 95% 96%    General: Good air movement bilaterally; no fever, no requiring oxygen supplementation.  Patient denies nausea or vomiting.  Still having some mild discomfort in between her shoulder blades. Cardiovascular: S1 and S2, no rubs, no gallops, no JVD Respiratory: No wheezing, no crackles, no using accessory muscles. Abdomen: Soft, nontender, nondistended, positive bowel sounds Extremities: no cyanosis, no clubbing; trace to 1+ edema appreciated   Discharge Instructions   Discharge Instructions    Diet - low sodium heart healthy   Complete by: As directed    Discharge instructions   Complete by: As directed    Take medications as prescribed Apply warm compresses at least 3 times a day (leave on for 10 minutes) to affected area into back to assist with swelling and pain. Maintain adequate hydration Complete antibiotic therapy as instructed take and then with  food to minimize GI upset symptoms. Follow heart healthy diet -Arrange follow-up with PCP in 10 days.   Increase activity slowly   Complete by: As directed      Allergies as of 08/18/2020      Reactions   Bee Venom Other (See Comments)   Unknown   Tdap [tetanus-diphth-acell Pertussis] Other (See Comments)   Unknown      Medication List    TAKE these medications   albuterol 108 (90 Base) MCG/ACT inhaler Commonly known as: VENTOLIN HFA Inhale 1 puff into the lungs every 6 (six) hours as needed for wheezing or shortness of breath.   albuterol (2.5 MG/3ML) 0.083% nebulizer solution Commonly known as: PROVENTIL Take 2.5 mg by nebulization daily as needed.   Anoro Ellipta 62.5-25 MCG/INH Aepb Generic drug: umeclidinium-vilanterol Inhale 1 puff into the lungs daily.   aspirin 81 MG EC tablet Take 1 tablet (81 mg total) by mouth daily.   atorvastatin 20 MG tablet Commonly known as: LIPITOR Take 20 mg by mouth every morning.   co-enzyme Q-10 30 MG capsule Take 100 mg by mouth daily.   cyclobenzaprine 5 MG tablet Commonly known as: FLEXERIL Take 1 tablet by mouth daily as needed for muscle pain.   doxycycline 100 MG tablet Commonly known as: VIBRA-TABS Take 1 tablet (100 mg total) by mouth 2 (two) times  daily for 7 days.   EPINEPHrine 0.3 mg/0.3 mL Soaj injection Commonly known as: EpiPen 2-Pak Inject 0.3 mLs (0.3 mg total) into the muscle once as needed (for severe allergic reaction). CAll 911 immediately if you have to use this medicine   fluticasone 50 MCG/ACT nasal spray Commonly known as: FLONASE Place 2 sprays into both nostrils daily.   furosemide 20 MG tablet Commonly known as: LASIX TAKE (1) TABLET BY MOUTH 2 TIMES DAILY AS NEEDED. What changed: See the new instructions.   gabapentin 100 MG capsule Commonly known as: NEURONTIN Take 2 capsules (200 mg total) by mouth 3 (three) times daily.   HYDROcodone-acetaminophen 10-325 MG tablet Commonly known as:  NORCO Take 1 tablet by mouth every 6 (six) hours as needed for severe pain. pain   isosorbide mononitrate 10 MG tablet Commonly known as: ISMO Take 10 mg by mouth daily.   losartan 25 MG tablet Commonly known as: COZAAR Take 25 mg by mouth daily.   metFORMIN 500 MG tablet Commonly known as: GLUCOPHAGE Take 500 mg by mouth daily.   metoprolol succinate 100 MG 24 hr tablet Commonly known as: TOPROL-XL Take 1 tablet (100 mg total) by mouth daily. Take with or immediately following a meal.   nitroGLYCERIN 0.4 MG SL tablet Commonly known as: Nitrostat Place 1 tablet (0.4 mg total) under the tongue every 5 (five) minutes as needed for chest pain.   Oxycodone HCl 10 MG Tabs Take 10 mg by mouth 4 (four) times daily as needed.   pantoprazole 40 MG tablet Commonly known as: PROTONIX Take 40 mg by mouth every morning.   predniSONE 5 MG tablet Commonly known as: DELTASONE Take 1 tablet (5 mg total) by mouth daily with breakfast.   primidone 50 MG tablet Commonly known as: MYSOLINE Take 50 mg by mouth at bedtime.      Allergies  Allergen Reactions  . Bee Venom Other (See Comments)    Unknown  . Tdap [Tetanus-Diphth-Acell Pertussis] Other (See Comments)    Unknown    Follow-up Information    Elfredia NevinsFusco, Lawrence, MD. Schedule an appointment as soon as possible for a visit in 10 day(s).   Specialty: Internal Medicine Contact information: [address] [place] [address] [phone]        Jonelle SidleMcDowell, Samuel G, MD .   Specialty: Cardiology Contact information: [address] [place] [address] [phone]        Marinus Mawaylor, Gregg W, MD .   Specialty: Cardiology Contact information: [address] [place] [address] [phone]                The results of significant diagnostics from this hospitalization (including imaging, microbiology, ancillary and laboratory) are listed below for reference.    Significant Diagnostic Studies: DG Chest 2  View  Result Date: 08/17/2020 CLINICAL DATA:  Shoulder and back pain.  COVID-19 negative EXAM: CHEST - 2 VIEW COMPARISON:  06/26/2019 and back to 11/07/2016. FINDINGS: Lateral view degraded by patient arm position. Midline trachea. Mild cardiomegaly. Prominent epicardial fat pad. No pleural effusion or pneumothorax. Mild hyperinflation. At least 1 upper right rib fracture. Subtle foci of increased density project over the fifth and sixth posterior left ribs. Not readily apparent on the prior. IMPRESSION: Densities projecting over posterior upper left ribs. Although these could represent healing fractures, given contralateral rib fracture or fractures, pulmonary nodules cannot be excluded. Consider further evaluation with chest CT. Otherwise, no acute findings. Hyperinflation, suggesting COPD. Electronically Signed  By: Jeronimo Greaves M.D.   On: 08/17/2020 16:48   NM Pulmonary Perfusion  Result Date: 08/18/2020 CLINICAL DATA:  Positive D-dimer.  Shortness of breath, chest pain EXAM: NUCLEAR MEDICINE PERFUSION LUNG SCAN TECHNIQUE: Perfusion images were obtained in multiple projections after intravenous injection of radiopharmaceutical. Ventilation scans intentionally deferred if perfusion scan and chest x-ray adequate for interpretation during COVID 19 epidemic. RADIOPHARMACEUTICALS:  4.4 mCi Tc-53m MAA IV COMPARISON:  08/17/2020 FINDINGS: No filling defects in the pulmonary arteries to suggest pulmonary emboli. IMPRESSION: No evidence of pulmonary embolus. Electronically Signed   By: Charlett Nose M.D.   On: 08/18/2020 11:30   US Venous Img Lower Bilateral (DVT)  Result Date: 08/18/2020 CLINICAL DATA:  Bilateral leg swelling. EXAM: BILATERAL LOWER EXTREMITY VENOUS DOPPLER ULTRASOUND TECHNIQUE: Gray-scale sonography with compression, as well as color and duplex ultrasound, were performed to evaluate the deep venous system(s) from the level of the common femoral vein through the popliteal and proximal calf  veins. COMPARISON:  Right lower extremity venous Doppler ultrasound dated December 21, 2015. FINDINGS: VENOUS Normal compressibility of the common femoral, superficial femoral, and popliteal veins, as well as the visualized calf veins. Visualized portions of profunda femoral vein and great saphenous vein unremarkable. No filling defects to suggest DVT on grayscale or color Doppler imaging. Doppler waveforms show normal direction of venous flow, normal respiratory plasticity and response to augmentation. OTHER None. Limitations: None. IMPRESSION: Negative. Electronically Signed   By: Obie Dredge M.D.   On: 08/18/2020 10:12    Microbiology: Recent Results (from the past 240 hour(s))  Culture, blood (routine x 2)     Status: None (Preliminary result)   Collection Time: 08/17/20  8:42 PM   Specimen: BLOOD  Result Value Ref Range Status   Specimen Description BLOOD LEFT ANTECUBITAL  Final   Special Requests   Final    BOTTLES DRAWN AEROBIC AND ANAEROBIC Blood Culture adequate volume   Culture   Final    NO GROWTH < 12 HOURS Performed at [facility], [address], [place], [place] [address]    Report Status PENDING  Incomplete  Culture, blood (routine x 2)     Status: None (Preliminary result)   Collection Time: 08/17/20  8:47 PM   Specimen: BLOOD  Result Value Ref Range Status   Specimen Description BLOOD RIGHT ANTECUBITAL  Final   Special Requests   Final    BOTTLES DRAWN AEROBIC AND ANAEROBIC Blood Culture adequate volume   Culture   Final    NO GROWTH < 12 HOURS Performed at [facility], [address], [place], [place] [address]    Report Status PENDING  Incomplete  SARS CORONAVIRUS 2 (TAT 6-24 HRS) Nasopharyngeal Nasopharyngeal Swab     Status: None   Collection Time: 08/17/20  9:23 PM   Specimen: Nasopharyngeal Swab  Result Value Ref Range Status   SARS Coronavirus 2 NEGATIVE NEGATIVE Final    Comment: (NOTE) SARS-CoV-2 target nucleic acids are NOT DETECTED.  The  SARS-CoV-2 RNA is generally detectable in upper and lower respiratory specimens during the acute phase of infection. Negative results do not preclude SARS-CoV-2 infection, do not rule out co-infections with other pathogens, and should not be used as the sole basis for treatment or other patient management decisions. Negative results must be combined with clinical observations, patient history, and epidemiological information. The expected result is Negative.  Fact Sheet for Patients: HairSlick.no  Fact Sheet for Healthcare Providers: quierodirigir.com  This test is not yet approved  or cleared by the [place] and  has been authorized for detection and/or diagnosis of SARS-CoV-2 by FDA under an Emergency Use Authorization (EUA). This EUA will remain  in effect (meaning this test can be used) for the duration of the COVID-19 declaration under Se ction 564(b)(1) of the Act, 21 U.S.C. section 360bbb-3(b)(1), unless the authorization is terminated or revoked sooner.  Performed at [facility], [address] [place], [place] [address]      Labs: Basic Metabolic Panel: Recent Labs  Lab 08/17/20 1545 08/18/20 0525  NA 136 141  K 4.2 4.0  CL 101 94*  CO2 25 34*  GLUCOSE 200* 111*  BUN 24* 19  CREATININE 0.93 0.78  CALCIUM 9.6 10.0  MG  --  1.9   Liver Function Tests: Recent Labs  Lab 08/17/20 1545 08/18/20 0525  AST 20 20  ALT 30 31  ALKPHOS 59 67  BILITOT 0.5 0.5  PROT 6.8 7.4  ALBUMIN 3.7 3.9   CBC: Recent Labs  Lab 08/17/20 1545 08/18/20 0525  WBC 22.3* 20.2*  NEUTROABS 17.6*  --   HGB 12.8 13.9  HCT 40.3 43.7  MCV 102.3* 101.2*  PLT 389 426*    CBG: Recent Labs  Lab 08/17/20 1754 08/18/20 0112 08/18/20 0724 08/18/20 1113  GLUCAP 159* 146* 115* 133*    Signed:  Vassie Loll MD.  [facility] 08/18/2020, 3:01 PM

## 2020-08-18 NOTE — Care Management Obs Status (Signed)
MEDICARE OBSERVATION STATUS NOTIFICATION   Patient Details  Name: Gina Bradley MRN: [id number] Date of Birth: April 11, 1960   Medicare Observation Status Notification Given:  Yes    Leitha Bleak, RN 08/18/2020, 3:41 PM

## 2020-08-18 NOTE — H&P (Signed)
TRH H&P    Patient Demographics:    Gina Bradley, is a 60 y.o. female  MRN: [id number]  DOB - [birth year]  Admit Date - 08/17/2020  Referring MD/NP/PA: Effie ShyWentz   Outpatient Primary MD for the patient is Elfredia NevinsFusco, Lawrence, MD  Patient coming from: Home  Chief complaint- upper back pain   HPI:    Gina Bradley  is a 60 y.o. female, with history of SVT, peripheral neuropathy, coronary artery disease, heart failure, GERD, hypertension, diabetes mellitus type 2, COPD and more presents the ED with chief complaint of back pain.  Patient reports that she has had pain between her shoulder blades, radiating over to her left shoulder blade and down her left arm.  She reports that sharp and throbbing pain.  She reports it is worse when she has positional changes of her head or neck, and when she lays on it.  She describes the pain as severe and intermittent.  She does have a history of spinal stenosis, and cervical spine pathology as well.  She does have numbness in the left hand, but reports that that is been ongoing for many years secondary to carpal tunnel syndrome.  This new pain has been present for 4 or 5 days.  She is also concerned about swelling in her legs.  She reports that her father had DVTs and she knows that that she has 2.  She is a difficult historian, reporting that she has had "several blood clots in the past."  When further questioned she reports that she never saw a doctor for them and she only took vinegar and apple cider to make them go away.  She does report that wearing compression hose that helps them as well.  The bilateral peripheral edema that she is reporting now has been present for 4 to 5 days.  She reports that she takes Lasix at home, but she does not take it the way she is supposed to.  She is supposed to take it twice a day every day, she only takes it once a day due to the inconvenience.  Patient has  no other complaints at this time.  Patient is a current smoker, does not drink, does not use illicit drugs, is not vaccinated for COVID.  Patient would.  In the ED Temp 98.1, heart rate 72, respiratory rate 14, blood pressure 129/81, satting at 97% Leukocytosis with white blood cell count of 22.3, hemoglobin 12.8 Chemistry panel is mostly unremarkable with a hyperglycemia of 200 UA is borderline, urine culture pending Patient was started on therapeutic Lovenox after positive D-dimer 1.04 VQ scan already ordered for the a.m. Lactic acid initially 2.4, improved to 1.8 Patient was also started on doxycycline for possible cellulitis Admission requested to observe patient until VQ scan can be done in the setting of contrast shortage    Review of systems:    In addition to the HPI above,  No Fever-chills, No Headache, No changes with Vision or hearing, No problems swallowing food or Liquids, No Chest pain, Cough or Shortness of Breath,  No Abdominal pain, No Nausea or Vomiting, bowel movements are regular, No Blood in stool or Urine, No dysuria, Admits to easily bruising No new joints pains-aches,  No new weakness, tingling, numbness in any extremity, No recent weight gain or loss, No polyuria, polydypsia or polyphagia, No significant Mental Stressors.  All other systems reviewed and are negative.    Past History of the following :    Past Medical History:  Diagnosis Date  . Anemia   . Cervical disc disease   . COPD (chronic obstructive pulmonary disease) (HCC)   . Diabetes mellitus without complication (HCC)   . Essential hypertension   . GERD (gastroesophageal reflux disease)   . Heart failure (HCC) 2018  . History of cardiac catheterization    Normal coronaries March 2015  . History of non-ST elevation myocardial infarction (NSTEMI)    Secondary to SVT  . Lymphedema   . Peripheral neuropathy   . Spinal stenosis   . SVT (supraventricular tachycardia) (HCC)        Past Surgical History:  Procedure Laterality Date  . CESAREAN SECTION    . ESOPHAGOGASTRODUODENOSCOPY N/A 02/10/2014   GMW:NUUVOZ tiny antral erosion; otherwise normal EGD. No explanation for patient's symptoms. Gallbladder needs further evaluation.  [name] LEFT HEART CATHETERIZATION WITH CORONARY ANGIOGRAM N/A 06/03/2013   Procedure: LEFT HEART CATHETERIZATION WITH CORONARY ANGIOGRAM;  Surgeon: Lesleigh Noe, MD;  Location: [facility] CATH LAB;  Service: Cardiovascular;  Laterality: N/A;      Social History:      Social History   Tobacco Use  . Smoking status: Former Smoker    Packs/day: 1.00    Years: 18.00    Pack years: 18.00    Types: Cigarettes    Start date: 09/17/2003    Quit date: 02/01/2019    Years since quitting: 1.5  . Smokeless tobacco: Never Used  Substance Use Topics  . Alcohol use: No    Alcohol/week: 0.0 standard drinks       Family History :     Family History  Problem Relation Age of Onset  . Heart attack Mother   . Diabetes Mother   . Hypertension Mother   . Angina Mother   . Diabetes Father   . Hypertension Father   . Heart failure Brother   . Suicidality Sister   . Colon cancer Other        Possibly Dad   . Stroke Maternal Grandmother   . Heart failure Maternal Grandmother   . Diabetes Maternal Grandfather   . Hypertension Daughter   . Hypertension Son       Home Medications:   Prior to Admission medications   Medication Sig Start Date End Date Taking? Authorizing Provider  albuterol (PROVENTIL HFA;VENTOLIN HFA) 108 (90 Base) MCG/ACT inhaler Inhale 1 puff into the lungs every 6 (six) hours as needed for wheezing or shortness of breath.   Yes [provider]  albuterol (PROVENTIL) (2.5 MG/3ML) 0.083% nebulizer solution Take 2.5 mg by nebulization daily as needed.   Yes [provider]  ANORO ELLIPTA 62.5-25 MCG/INH AEPB Inhale 1 puff into the lungs daily.  09/09/16  Yes [provider]  aspirin EC 81 MG EC tablet Take  1 tablet (81 mg total) by mouth daily. 06/03/13  Yes Lonia Blood, MD  atorvastatin (LIPITOR) 20 MG tablet Take 20 mg by mouth every morning. 03/04/13  Yes [provider]  co-enzyme Q-10 30 MG capsule Take 100 mg by mouth daily.  Yes [provider]  cyclobenzaprine (FLEXERIL) 5 MG tablet Take 1 tablet by mouth daily as needed for muscle pain. 05/11/20  Yes [provider]  EPINEPHrine (EPIPEN 2-PAK) 0.3 mg/0.3 mL IJ SOAJ injection Inject 0.3 mLs (0.3 mg total) into the muscle once as needed (for severe allergic reaction). CAll 911 immediately if you have to use this medicine 10/21/15  Yes Eber Hong, MD  fluticasone [facility]) 50 MCG/ACT nasal spray Place 2 sprays into both nostrils daily.   Yes [provider]  furosemide (LASIX) 20 MG tablet TAKE (1) TABLET BY MOUTH 2 TIMES DAILY AS NEEDED. Patient taking differently: Take 20 mg by mouth daily. 08/23/19  Yes Marinus Maw, MD  gabapentin (NEURONTIN) 100 MG capsule Take 2 capsules (200 mg total) by mouth 3 (three) times daily. 07/04/19  Yes Vassie Loll, MD  HYDROcodone-acetaminophen [facility]) 10-325 MG tablet Take 1 tablet by mouth every 6 (six) hours as needed for severe pain. pain 07/04/19  Yes Vassie Loll, MD  isosorbide mononitrate (ISMO) 10 MG tablet Take 10 mg by mouth daily.   Yes [provider]  losartan (COZAAR) 25 MG tablet Take 25 mg by mouth daily.   Yes [provider]  metFORMIN (GLUCOPHAGE) 500 MG tablet Take 500 mg by mouth daily. 11/30/16  Yes [provider]  metoprolol succinate (TOPROL-XL) 100 MG 24 hr tablet Take 1 tablet (100 mg total) by mouth daily. Take with or immediately following a meal. 06/30/20 09/28/20 Yes Dyann Kief, PA-C  nitroGLYCERIN (NITROSTAT) 0.4 MG SL tablet Place 1 tablet (0.4 mg total) under the tongue every 5 (five) minutes as needed for chest pain. 12/22/14  Yes Rolland Porter, MD  Oxycodone HCl 10 MG TABS Take 10 mg by mouth 4 (four) times  daily as needed. 09/10/19  Yes [provider]  pantoprazole (PROTONIX) 40 MG tablet Take 40 mg by mouth every morning. 05/25/13  Yes [provider]  predniSONE (DELTASONE) 5 MG tablet Take 1 tablet (5 mg total) by mouth daily with breakfast. 10/22/19  Yes Nyoka Cowden, MD  primidone (MYSOLINE) 50 MG tablet Take 50 mg by mouth at bedtime.  04/06/19  Yes [provider]     Allergies:     Allergies  Allergen Reactions  . Bee Venom Other (See Comments)    Unknown  . Tdap [Tetanus-Diphth-Acell Pertussis] Other (See Comments)    Unknown     Physical Exam:   Vitals  Blood pressure 133/76, pulse 69, temperature 97.8 F (36.6 C), resp. rate 20, height 5\' 1"  (1.549 m), weight 93.6 kg, last menstrual period 01/05/2015, SpO2 95 %.  1.  General: Patient  sitting up in bed eating macaroni and cheese  2. Psychiatric: Mood and behavior normal for situation, memory intact, alert and oriented x3  3. Neurologic: Speech and language are normal, face is symmetric, moves all 4 extremities voluntarily, no focal deficits on limited exam  4. HEENMT:  Head is atraumatic, normocephalic, pupils are reactive to light, neck is supple, trachea is midline, mucous membranes are moist  5. Respiratory : Lungs are clear to auscultation bilaterally without wheezes, rhonchi, rales, no cyanosis, no clubbing  6. Cardiovascular : Heart rate is normal, rhythm is regular, no murmurs rubs or gallops, peripheral edema present  7. Gastrointestinal:  Abdomen is soft, nondistended, nontender to palpation, bowel sounds normal  8. Skin:  Scabbing and bruising around her upper and lower extremities bilaterally, erythema and crusting over the upper back   9.Musculoskeletal:  Pitting edema present, no calf tenderness, no acute deformity    Data Review:    CBC Recent Labs  Lab 08/17/20 1545  WBC 22.3*  HGB 12.8  HCT 40.3  PLT 389  MCV 102.3*  MCH 32.5  MCHC 31.8  RDW 13.2   LYMPHSABS 2.4  MONOABS 1.4*  EOSABS 0.1  BASOSABS 0.1   ------------------------------------------------------------------------------------------------------------------  Results for orders placed or performed during the hospital encounter of 08/17/20 (from the past 48 hour(s))  Urinalysis, Routine w reflex microscopic Urine, Clean Catch     Status: Abnormal   Collection Time: 08/17/20  3:39 PM  Result Value Ref Range   Color, Urine STRAW (A) YELLOW   APPearance CLEAR CLEAR   Specific Gravity, Urine 1.009 1.005 - 1.030   pH 8.0 5.0 - 8.0   Glucose, UA NEGATIVE NEGATIVE mg/dL   Hgb urine dipstick LARGE (A) NEGATIVE   Bilirubin Urine NEGATIVE NEGATIVE   Ketones, ur NEGATIVE NEGATIVE mg/dL   Protein, ur NEGATIVE NEGATIVE mg/dL   Nitrite NEGATIVE NEGATIVE   Leukocytes,Ua NEGATIVE NEGATIVE   RBC / HPF 11-20 0 - 5 RBC/hpf   WBC, UA 0-5 0 - 5 WBC/hpf   Bacteria, UA NONE SEEN NONE SEEN    Comment: Performed at [facility], [address], [place], [place] [address]  Comprehensive metabolic panel     Status: Abnormal   Collection Time: 08/17/20  3:45 PM  Result Value Ref Range   Sodium 136 135 - 145 mmol/L   Potassium 4.2 3.5 - 5.1 mmol/L   Chloride 101 98 - 111 mmol/L   CO2 25 22 - 32 mmol/L   Glucose, Bld 200 (H) 70 - 99 mg/dL    Comment: Glucose reference range applies only to samples taken after fasting for at least 8 hours.   BUN 24 (H) 6 - 20 mg/dL   Creatinine, Ser 6.04 0.44 - 1.00 mg/dL   Calcium 9.6 8.9 - 54.0 mg/dL   Total Protein 6.8 6.5 - 8.1 g/dL   Albumin 3.7 3.5 - 5.0 g/dL   AST 20 15 - 41 U/L   ALT 30 0 - 44 U/L   Alkaline Phosphatase 59 38 - 126 U/L   Total Bilirubin 0.5 0.3 - 1.2 mg/dL   GFR, Estimated >98 >11 mL/min    Comment: (NOTE) Calculated using the CKD-EPI Creatinine Equation (2021)    Anion gap 10 5 - 15    Comment: Performed at [facility], [address] [place], [place] [address]  CBC with Differential     Status: Abnormal   Collection Time:  08/17/20  3:45 PM  Result Value Ref Range   WBC 22.3 (H) 4.0 - 10.5 K/uL   RBC 3.94 3.87 - 5.11 MIL/uL   Hemoglobin 12.8 12.0 - 15.0 g/dL   HCT 29.5 62.1 - 30.8 %   MCV 102.3 (H) 80.0 - 100.0 fL   MCH 32.5 26.0 - 34.0 pg   MCHC 31.8 30.0 - 36.0 g/dL   RDW 65.7 84.6 - 96.2 %   Platelets 389 150 - 400 K/uL   nRBC 0.0 0.0 - 0.2 %   Neutrophils Relative % 78 %   Neutro Abs 17.6 (H) 1.7 - 7.7 K/uL   Lymphocytes Relative 11 %   Lymphs Abs 2.4 0.7 - 4.0 K/uL   Monocytes Relative 6 %   Monocytes Absolute 1.4 (H) 0.1 - 1.0 K/uL   Eosinophils Relative 1 %   Eosinophils Absolute 0.1 0.0 - 0.5 K/uL  Basophils Relative 0 %   Basophils Absolute 0.1 0.0 - 0.1 K/uL   WBC Morphology MILD LEFT SHIFT (1-5% METAS, OCC MYELO, OCC BANDS)    Immature Granulocytes 4 %   Abs Immature Granulocytes 0.81 (H) 0.00 - 0.07 K/uL   Reactive, Benign Lymphocytes PRESENT     Comment: Performed at [facility], [address], [place], [place] [address]  D-dimer, quantitative     Status: Abnormal   Collection Time: 08/17/20  3:45 PM  Result Value Ref Range   D-Dimer, Quant 1.04 (H) 0.00 - 0.50 ug/mL-FEU    Comment: (NOTE) At the manufacturer cut-off value of 0.5 g/mL FEU, this assay has a negative predictive value of 95-100%.This assay is intended for use in conjunction with a clinical pretest probability (PTP) assessment model to exclude pulmonary embolism (PE) and deep venous thrombosis (DVT) in outpatients suspected of PE or DVT. Results should be correlated with clinical presentation. Performed at [facility], [address], [place], [place] [address]   CBG monitoring, ED     Status: Abnormal   Collection Time: 08/17/20  5:54 PM  Result Value Ref Range   Glucose-Capillary 159 (H) 70 - 99 mg/dL    Comment: Glucose reference range applies only to samples taken after fasting for at least 8 hours.  Lactic acid, plasma     Status: Abnormal   Collection Time: 08/17/20  9:02 PM  Result Value Ref Range    Lactic Acid, Venous 2.4 (HH) 0.5 - 1.9 mmol/L    Comment: CRITICAL RESULT CALLED TO, READ BACK BY AND VERIFIED WITH: NICHOLS,K ON 08/17/20 AT 2130 BY LOY,C Performed at [facility], [address], [place], [place] [address]   Lactic acid, plasma     Status: None   Collection Time: 08/17/20 10:53 PM  Result Value Ref Range   Lactic Acid, Venous 1.8 0.5 - 1.9 mmol/L    Comment: Performed at [facility], [address] [place], [place] [address]  Glucose, capillary     Status: Abnormal   Collection Time: 08/18/20  1:12 AM  Result Value Ref Range   Glucose-Capillary 146 (H) 70 - 99 mg/dL    Comment: Glucose reference range applies only to samples taken after fasting for at least 8 hours.    Chemistries  Recent Labs  Lab 08/17/20 1545  NA 136  K 4.2  CL 101  CO2 25  GLUCOSE 200*  BUN 24*  CREATININE 0.93  CALCIUM 9.6  AST 20  ALT 30  ALKPHOS 59  BILITOT 0.5   ------------------------------------------------------------------------------------------------------------------  ------------------------------------------------------------------------------------------------------------------ GFR: Estimated Creatinine Clearance: 68 mL/min (by C-G formula based on SCr of 0.93 mg/dL). Liver Function Tests: Recent Labs  Lab 08/17/20 1545  AST 20  ALT 30  ALKPHOS 59  BILITOT 0.5  PROT 6.8  ALBUMIN 3.7   No results for input(s): LIPASE, AMYLASE in the last 168 hours. No results for input(s): AMMONIA in the last 168 hours. Coagulation Profile: No results for input(s): INR, PROTIME in the last 168 hours. Cardiac Enzymes: No results for input(s): CKTOTAL, CKMB, CKMBINDEX, TROPONINI in the last 168 hours. BNP (last 3 results) No results for input(s): PROBNP in the last 8760 hours. HbA1C: No results for input(s): HGBA1C in the last 72 hours. CBG: Recent Labs  Lab 08/17/20 1754 08/18/20 0112  GLUCAP 159* 146*   Lipid Profile: No results for input(s): CHOL, HDL, LDLCALC,  TRIG, CHOLHDL, LDLDIRECT in the last 72 hours. Thyroid Function Tests: No results for input(s): TSH, T4TOTAL,  FREET4, T3FREE, THYROIDAB in the last 72 hours. Anemia Panel: No results for input(s): VITAMINB12, FOLATE, FERRITIN, TIBC, IRON, RETICCTPCT in the last 72 hours.  --------------------------------------------------------------------------------------------------------------- Urine analysis:    Component Value Date/Time   COLORURINE STRAW (A) 08/17/2020 1539   APPEARANCEUR CLEAR 08/17/2020 1539   LABSPEC 1.009 08/17/2020 1539   PHURINE 8.0 08/17/2020 1539   GLUCOSEU NEGATIVE 08/17/2020 1539   HGBUR LARGE (A) 08/17/2020 1539   BILIRUBINUR NEGATIVE 08/17/2020 1539   KETONESUR NEGATIVE 08/17/2020 1539   PROTEINUR NEGATIVE 08/17/2020 1539   UROBILINOGEN 1.0 07/29/2009 1213   NITRITE NEGATIVE 08/17/2020 1539   LEUKOCYTESUR NEGATIVE 08/17/2020 1539      Imaging Results:    DG Chest 2 View  Result Date: 08/17/2020 CLINICAL DATA:  Shoulder and back pain.  COVID-19 negative EXAM: CHEST - 2 VIEW COMPARISON:  06/26/2019 and back to 11/07/2016. FINDINGS: Lateral view degraded by patient arm position. Midline trachea. Mild cardiomegaly. Prominent epicardial fat pad. No pleural effusion or pneumothorax. Mild hyperinflation. At least 1 upper right rib fracture. Subtle foci of increased density project over the fifth and sixth posterior left ribs. Not readily apparent on the prior. IMPRESSION: Densities projecting over posterior upper left ribs. Although these could represent healing fractures, given contralateral rib fracture or fractures, pulmonary nodules cannot be excluded. Consider further evaluation with chest CT. Otherwise, no acute findings. Hyperinflation, suggesting COPD. Electronically Signed   By: Jeronimo Greaves M.D.   On: 08/17/2020 16:48    My personal review of EKG: Rhythm NSR, Rate72 /min, QTc 427 ,no Acute ST changes   Assessment & Plan:    Active Problems:   Cellulitis    Peripheral edema   Diabetes mellitus (HCC)   1. Peripheral edema 1. Patient's last echo was in 2021 and had indeterminate diastolic parameters with ejection fraction 75% 2. Update echo 3. 1 dose Lasix 4. D-dimer elevated-concern for clot- ultrasound bilateral DVT study in the a.m. 5. Continue to monitor 2. Cellulitis 1. Erythema and crusting as well as pain over the upper back 2. Doxy started in the ED -continue Doxy 3. Blood culture pending 4. Continue to monitor 3. Elevated D-dimer 1. VQ scan in the a.m. 2. CT is not done due to contrast shortage 4. Lactic acidosis 1. Improved from 2.4-1.8 5. Type II diabetes mellitus with hyperglycemia 1. Glucose 200 on chemistry 2. Hold oral hypoglycemics, continue sliding scale coverage 6. GERD 1. Continue Protonix   DVT Prophylaxis-   Lovenox  AM Labs Ordered, also please review Full Orders  Family Communication: No family at bedside Code Status: Full  Admission status: ObservationTime spent in minutes : 65   Ariza Evans B Zierle-Ghosh DO

## 2020-08-18 NOTE — TOC Progression Note (Signed)
Transition of Care [facility]) - Progression Note    Patient Details  Name: Gina Bradley MRN: [id number] Date of Birth: 23-Oct-1960  Transition of Care [facility]) CM/SW Contact  Karn [place], [place] Phone Number: 08/18/2020, 10:45 AM  Clinical Narrative:  TOC received consult for CHF screening. LCSW attempted to complete but pt not in room. Per Diplomatic Services operational officer, pt off floor for procedure. TOC will attempt to complete assessment later today. CHF book ordered.          Expected Discharge Plan and Services                                                 Social Determinants of Health (SDOH) Interventions    Readmission Risk Interventions No flowsheet data found.

## 2020-08-18 NOTE — Progress Notes (Signed)
*  PRELIMINARY RESULTS* Echocardiogram 2D Echocardiogram has been performed.  Gina Bradley 08/18/2020, 3:13 PM

## 2020-08-19 LAB — ECHOCARDIOGRAM COMPLETE
Area-P 1/2: 2.16 cm2
Height: 61 in
S' Lateral: 1.55 cm
Weight: 3269.86 oz

## 2020-08-19 LAB — URINE CULTURE

## 2020-08-20 LAB — ROCKY MTN SPOTTED FVR ABS PNL(IGG+IGM)
RMSF IgG: NEGATIVE
RMSF IgM: 0.27 index (ref 0.00–0.89)

## 2020-08-20 LAB — MISC LABCORP TEST (SEND OUT): Labcorp test code: 164226

## 2020-08-20 LAB — B. BURGDORFI ANTIBODIES

## 2020-08-22 LAB — CULTURE, BLOOD (ROUTINE X 2)
Culture: NO GROWTH
Culture: NO GROWTH
Special Requests: ADEQUATE
Special Requests: ADEQUATE

## 2020-08-24 DIAGNOSIS — I10 Essential (primary) hypertension: Secondary | ICD-10-CM | POA: Diagnosis not present

## 2020-08-24 DIAGNOSIS — J449 Chronic obstructive pulmonary disease, unspecified: Secondary | ICD-10-CM | POA: Diagnosis not present

## 2020-08-24 DIAGNOSIS — R58 Hemorrhage, not elsewhere classified: Secondary | ICD-10-CM | POA: Diagnosis not present

## 2020-08-24 DIAGNOSIS — L0390 Cellulitis, unspecified: Secondary | ICD-10-CM | POA: Diagnosis not present

## 2020-09-09 ENCOUNTER — Encounter (HOSPITAL_COMMUNITY): Payer: Self-pay | Admitting: Physical Therapy

## 2020-09-09 ENCOUNTER — Other Ambulatory Visit: Payer: Self-pay

## 2020-09-09 ENCOUNTER — Ambulatory Visit (HOSPITAL_COMMUNITY): Payer: Medicare Other | Attending: Internal Medicine | Admitting: Physical Therapy

## 2020-09-09 DIAGNOSIS — M546 Pain in thoracic spine: Secondary | ICD-10-CM | POA: Insufficient documentation

## 2020-09-09 NOTE — Therapy (Signed)
[facility] [address] [place], [place], [address] Phone: [phone]   Fax:  [phone]  Physical Therapy Evaluation  Patient Details  Name: Gina Bradley MRN: [id number] Date of Birth: 05/24/1960 Referring Provider (PT): Vassie Loll MD   Encounter Date: 09/09/2020   PT End of Session - 09/09/20 1614    Visit Number 1    Number of Visits 1    Date for PT Re-Evaluation 09/09/20    Authorization Type Primary UHC medicare Secondary medicaid    PT Start Time 1522    PT Stop Time 1600    PT Time Calculation (min) 38 min    Activity Tolerance Patient tolerated treatment well    Behavior During Therapy [facility] for tasks assessed/performed           Past Medical History:  Diagnosis Date  . Anemia   . Cervical disc disease   . COPD (chronic obstructive pulmonary disease) (HCC)   . Diabetes mellitus without complication (HCC)   . Essential hypertension   . GERD (gastroesophageal reflux disease)   . Heart failure (HCC) 2018  . History of cardiac catheterization    Normal coronaries March 2015  . History of non-ST elevation myocardial infarction (NSTEMI)    Secondary to SVT  . Lymphedema   . Peripheral neuropathy   . Spinal stenosis   . SVT (supraventricular tachycardia) (HCC)     Past Surgical History:  Procedure Laterality Date  . CESAREAN SECTION    . ESOPHAGOGASTRODUODENOSCOPY N/A 02/10/2014   [id number] tiny antral erosion; otherwise normal EGD. No explanation for patient's symptoms. Gallbladder needs further evaluation.  [name] LEFT HEART CATHETERIZATION WITH CORONARY ANGIOGRAM N/A 06/03/2013   Procedure: LEFT HEART CATHETERIZATION WITH CORONARY ANGIOGRAM;  Surgeon: Lesleigh Noe, MD;  Location: [facility] CATH LAB;  Service: Cardiovascular;  Laterality: N/A;    There were no vitals filed for this visit.    Subjective Assessment - 09/09/20 1523    Subjective Patient is a 60 y.o. female who presents to physical therapy with c/o  lymphedema. She has issues with both legs, trunk, neck, left arm. She is not sure if symptoms are related to orthopedic or lymphedema. She does have a exploded disc in her neck. She has pain with turning her neck. She notes improvement with pain medicine. Patient states her main goal is to help her symptoms. She has cysts on her back that are causing pain.    Limitations Lifting;House hold activities    Patient Stated Goals help with symptoms    Currently in Pain? No/denies              [facility] PT Assessment - 09/09/20 0001      Assessment   Medical Diagnosis Idiopathic Peripheral Neuropathy    Referring Provider (PT) Vassie Loll MD    Onset Date/Surgical Date 08/09/20    Prior Therapy for lymph      Precautions   Precautions None      Restrictions   Weight Bearing Restrictions No      Balance Screen   Has the patient fallen in the past 6 months No    Has the patient had a decrease in activity level because of a fear of falling?  No    Is the patient reluctant to leave their home because of a fear of falling?  No      Prior Function   Level of Independence Independent    Vocation On disability  Cognition   Overall Cognitive Status Within Functional Limits for tasks assessed      Observation/Other Assessments   Observations donned compression garments bilateral LE, bruising all over, dowagers hump, scar tissue/healing "cyst" in thoracic region under pain patch    Focus on Therapeutic Outcomes (FOTO)  n/a      ROM / Strength   AROM / PROM / Strength AROM;Strength      AROM   AROM Assessment Site Cervical    Cervical Flexion 0% limited    Cervical Extension 0% limited    Cervical - Right Side Bend 0% limited, pinch in left side of UT    Cervical - Left Side Bend 0% limited    Cervical - Right Rotation 0% limited    Cervical - Left Rotation 0% limited      Strength   Strength Assessment Site Shoulder;Elbow;Wrist;Hand    Right/Left Shoulder Right;Left    Right  Shoulder Flexion 4+/5    Left Shoulder Flexion 3+/5    Right/Left Elbow Right;Left    Right Elbow Flexion 4/5    Right Elbow Extension 4/5    Left Elbow Flexion 4-/5    Left Elbow Extension 4-/5    Right/Left Wrist Right;Left    Right/Left hand Right;Left      Palpation   Palpation comment TTP Left UT                      Objective measurements completed on examination: See above findings.               PT Education - 09/09/20 1522    Education Details Patient educated on exam findings, POC, scope of PT, use of compression garments    Person(s) Educated Patient    Methods Explanation    Comprehension Verbalized understanding            PT Short Term Goals - 09/09/20 1625      PT SHORT TERM GOAL #1   Title Patient will be educated on exam findings.    Time 1    Period Days    Status Achieved    Target Date 09/09/20                     Plan - 09/09/20 1624    Clinical Impression Statement Patient is a 60 y.o. female who presents to PT with several complaints consisting of lymphedema, pain in back related to cyst, and intermittent neck symptoms. Patient demonstrating good cervical spine ROM without symptoms but impaired shoulder and elbow strength bilaterally with greater deficit on LUE. Patient does not have concordant pain with basic orthopedic testing. Patient stating symptoms in thoracic region related are probably related to her cyst. Patient with donned compression in LE and no significant evidence of edema appreciated in UE. Discussed with patient if she wanted f/u for lymph and originally stated yes but changed her mind as she felt she had it under control. Patient with pain patch over healing cyst/scar tissue which patient states is cause of pain. Patient educated on returning if any further needs arise. Patient does not require additional PT services at this time.    Personal Factors and Comorbidities Comorbidity 3+;Time since onset of  injury/illness/exacerbation;Fitness;Behavior Pattern;Past/Current Experience    Comorbidities COPD, Cardiac issues, Lymphedema, cysts    Examination-Activity Limitations Lift    Stability/Clinical Decision Making Stable/Uncomplicated    Clinical Decision Making Low    PT Frequency One time visit  PT Treatment/Interventions ADLs/Self Care Home Management;Gait training;Stair training;Functional mobility training;Therapeutic activities;Therapeutic exercise;Balance training;Neuromuscular re-education;Patient/family education    PT Next Visit Plan n/a    Consulted and Agree with Plan of Care Patient           Patient will benefit from skilled therapeutic intervention in order to improve the following deficits and impairments:  Pain,Decreased mobility  Visit Diagnosis: Pain in thoracic spine     Problem List Patient Active Problem List   Diagnosis Date Noted  . Diabetes mellitus (HCC) 08/18/2020  . Tick bite of left lower leg   . Peripheral edema 08/17/2020  . Acute psychosis (HCC)   . Hypokalemia   . Class 1 obesity due to excess calories with body mass index (BMI) of 33.0 to 33.9 in adult   . Cellulitis   . AMS (altered mental status) 06/27/2019  . Acidosis, metabolic 06/27/2019  . PMB (postmenopausal bleeding) 10/06/2016  . Chest pressure 01/05/2015  . Gastric erosion   . Encounter for screening colonoscopy 01/22/2014  . Constipation 01/20/2014  . GERD (gastroesophageal reflux disease) 01/20/2014  . Dyspepsia 01/20/2014  . SVT (supraventricular tachycardia) (HCC) 05/31/2013  . HTN (hypertension), benign 05/31/2013  . Hypercholesteremia 05/31/2013  . COPD  GOLD 0/ atypical insp portion of f/v curve  05/31/2013  . Cervical stenosis of spine 05/31/2013  . Peripheral neuropathy 05/31/2013    4:26 PM, 09/09/20 Wyman Songster PT, DPT Physical Therapist at [facility]   [facility] [address]  [place], [place], [address] Phone: [phone]   Fax:  [phone]  Name: Gina Bradley MRN: [id number] Date of Birth: 1960/11/29

## 2020-09-28 DIAGNOSIS — M1990 Unspecified osteoarthritis, unspecified site: Secondary | ICD-10-CM | POA: Diagnosis not present

## 2020-09-28 DIAGNOSIS — D692 Other nonthrombocytopenic purpura: Secondary | ICD-10-CM | POA: Diagnosis not present

## 2020-09-28 DIAGNOSIS — J449 Chronic obstructive pulmonary disease, unspecified: Secondary | ICD-10-CM | POA: Diagnosis not present

## 2020-09-28 DIAGNOSIS — W57XXXA Bitten or stung by nonvenomous insect and other nonvenomous arthropods, initial encounter: Secondary | ICD-10-CM | POA: Diagnosis not present

## 2020-09-28 DIAGNOSIS — I10 Essential (primary) hypertension: Secondary | ICD-10-CM | POA: Diagnosis not present

## 2020-09-28 DIAGNOSIS — Z1389 Encounter for screening for other disorder: Secondary | ICD-10-CM | POA: Diagnosis not present

## 2020-09-28 DIAGNOSIS — E1140 Type 2 diabetes mellitus with diabetic neuropathy, unspecified: Secondary | ICD-10-CM | POA: Diagnosis not present

## 2020-09-29 ENCOUNTER — Other Ambulatory Visit: Payer: Self-pay | Admitting: Internal Medicine

## 2020-09-29 DIAGNOSIS — Z1231 Encounter for screening mammogram for malignant neoplasm of breast: Secondary | ICD-10-CM

## 2020-10-26 DIAGNOSIS — J449 Chronic obstructive pulmonary disease, unspecified: Secondary | ICD-10-CM | POA: Diagnosis not present

## 2020-10-26 DIAGNOSIS — R739 Hyperglycemia, unspecified: Secondary | ICD-10-CM | POA: Diagnosis not present

## 2020-10-26 DIAGNOSIS — G894 Chronic pain syndrome: Secondary | ICD-10-CM | POA: Diagnosis not present

## 2020-10-26 DIAGNOSIS — I10 Essential (primary) hypertension: Secondary | ICD-10-CM | POA: Diagnosis not present

## 2020-10-26 DIAGNOSIS — E782 Mixed hyperlipidemia: Secondary | ICD-10-CM | POA: Diagnosis not present

## 2020-10-26 DIAGNOSIS — Z1389 Encounter for screening for other disorder: Secondary | ICD-10-CM | POA: Diagnosis not present

## 2020-10-26 DIAGNOSIS — E781 Pure hyperglyceridemia: Secondary | ICD-10-CM | POA: Diagnosis not present

## 2020-10-26 DIAGNOSIS — R7309 Other abnormal glucose: Secondary | ICD-10-CM | POA: Diagnosis not present

## 2020-10-26 DIAGNOSIS — M1991 Primary osteoarthritis, unspecified site: Secondary | ICD-10-CM | POA: Diagnosis not present

## 2020-11-25 DIAGNOSIS — G894 Chronic pain syndrome: Secondary | ICD-10-CM | POA: Diagnosis not present

## 2020-11-25 DIAGNOSIS — J449 Chronic obstructive pulmonary disease, unspecified: Secondary | ICD-10-CM | POA: Diagnosis not present

## 2020-11-25 DIAGNOSIS — M1991 Primary osteoarthritis, unspecified site: Secondary | ICD-10-CM | POA: Diagnosis not present

## 2020-12-25 DIAGNOSIS — L0293 Carbuncle, unspecified: Secondary | ICD-10-CM | POA: Diagnosis not present

## 2020-12-25 DIAGNOSIS — E1140 Type 2 diabetes mellitus with diabetic neuropathy, unspecified: Secondary | ICD-10-CM | POA: Diagnosis not present

## 2020-12-25 DIAGNOSIS — E782 Mixed hyperlipidemia: Secondary | ICD-10-CM | POA: Diagnosis not present

## 2021-01-21 DIAGNOSIS — M1991 Primary osteoarthritis, unspecified site: Secondary | ICD-10-CM | POA: Diagnosis not present

## 2021-01-21 DIAGNOSIS — G894 Chronic pain syndrome: Secondary | ICD-10-CM | POA: Diagnosis not present

## 2021-02-24 DIAGNOSIS — G894 Chronic pain syndrome: Secondary | ICD-10-CM | POA: Diagnosis not present

## 2021-02-24 DIAGNOSIS — E1140 Type 2 diabetes mellitus with diabetic neuropathy, unspecified: Secondary | ICD-10-CM | POA: Diagnosis not present

## 2021-02-24 DIAGNOSIS — M1991 Primary osteoarthritis, unspecified site: Secondary | ICD-10-CM | POA: Diagnosis not present

## 2021-02-24 DIAGNOSIS — I10 Essential (primary) hypertension: Secondary | ICD-10-CM | POA: Diagnosis not present

## 2021-02-24 DIAGNOSIS — J449 Chronic obstructive pulmonary disease, unspecified: Secondary | ICD-10-CM | POA: Diagnosis not present

## 2021-03-26 DIAGNOSIS — I10 Essential (primary) hypertension: Secondary | ICD-10-CM | POA: Diagnosis not present

## 2021-03-26 DIAGNOSIS — M1991 Primary osteoarthritis, unspecified site: Secondary | ICD-10-CM | POA: Diagnosis not present

## 2021-03-26 DIAGNOSIS — E1140 Type 2 diabetes mellitus with diabetic neuropathy, unspecified: Secondary | ICD-10-CM | POA: Diagnosis not present

## 2021-04-15 DIAGNOSIS — K4090 Unilateral inguinal hernia, without obstruction or gangrene, not specified as recurrent: Secondary | ICD-10-CM | POA: Diagnosis not present

## 2021-04-15 DIAGNOSIS — G894 Chronic pain syndrome: Secondary | ICD-10-CM | POA: Diagnosis not present

## 2021-04-15 DIAGNOSIS — E1140 Type 2 diabetes mellitus with diabetic neuropathy, unspecified: Secondary | ICD-10-CM | POA: Diagnosis not present

## 2021-04-15 DIAGNOSIS — L03119 Cellulitis of unspecified part of limb: Secondary | ICD-10-CM | POA: Diagnosis not present

## 2021-04-15 DIAGNOSIS — J449 Chronic obstructive pulmonary disease, unspecified: Secondary | ICD-10-CM | POA: Diagnosis not present

## 2021-04-15 DIAGNOSIS — I10 Essential (primary) hypertension: Secondary | ICD-10-CM | POA: Diagnosis not present

## 2021-05-18 DIAGNOSIS — M1991 Primary osteoarthritis, unspecified site: Secondary | ICD-10-CM | POA: Diagnosis not present

## 2021-05-18 DIAGNOSIS — J449 Chronic obstructive pulmonary disease, unspecified: Secondary | ICD-10-CM | POA: Diagnosis not present

## 2021-05-18 DIAGNOSIS — E1140 Type 2 diabetes mellitus with diabetic neuropathy, unspecified: Secondary | ICD-10-CM | POA: Diagnosis not present

## 2021-05-18 DIAGNOSIS — I10 Essential (primary) hypertension: Secondary | ICD-10-CM | POA: Diagnosis not present

## 2021-05-18 DIAGNOSIS — G894 Chronic pain syndrome: Secondary | ICD-10-CM | POA: Diagnosis not present

## 2021-06-06 ENCOUNTER — Emergency Department (HOSPITAL_COMMUNITY)
Admission: EM | Admit: 2021-06-06 | Discharge: 2021-06-06 | Disposition: A | Discharge status: Home / Self Care | Payer: Medicare Other | Attending: Emergency Medicine | Admitting: Emergency Medicine

## 2021-06-06 ENCOUNTER — Emergency Department (HOSPITAL_COMMUNITY): Payer: Medicare Other

## 2021-06-06 ENCOUNTER — Other Ambulatory Visit: Payer: Self-pay

## 2021-06-06 ENCOUNTER — Encounter (HOSPITAL_COMMUNITY): Payer: Self-pay

## 2021-06-06 DIAGNOSIS — I10 Essential (primary) hypertension: Secondary | ICD-10-CM | POA: Insufficient documentation

## 2021-06-06 DIAGNOSIS — M545 Low back pain, unspecified: Secondary | ICD-10-CM | POA: Diagnosis not present

## 2021-06-06 DIAGNOSIS — R6889 Other general symptoms and signs: Secondary | ICD-10-CM | POA: Diagnosis not present

## 2021-06-06 DIAGNOSIS — Z8781 Personal history of (healed) traumatic fracture: Secondary | ICD-10-CM

## 2021-06-06 DIAGNOSIS — S32511A Fracture of superior rim of right pubis, initial encounter for closed fracture: Secondary | ICD-10-CM | POA: Diagnosis not present

## 2021-06-06 DIAGNOSIS — M25551 Pain in right hip: Secondary | ICD-10-CM | POA: Insufficient documentation

## 2021-06-06 DIAGNOSIS — S32049A Unspecified fracture of fourth lumbar vertebra, initial encounter for closed fracture: Secondary | ICD-10-CM | POA: Diagnosis not present

## 2021-06-06 DIAGNOSIS — J449 Chronic obstructive pulmonary disease, unspecified: Secondary | ICD-10-CM | POA: Diagnosis not present

## 2021-06-06 DIAGNOSIS — Z7982 Long term (current) use of aspirin: Secondary | ICD-10-CM | POA: Diagnosis not present

## 2021-06-06 DIAGNOSIS — Z743 Need for continuous supervision: Secondary | ICD-10-CM | POA: Diagnosis not present

## 2021-06-06 DIAGNOSIS — S32591A Other specified fracture of right pubis, initial encounter for closed fracture: Secondary | ICD-10-CM

## 2021-06-06 DIAGNOSIS — M25559 Pain in unspecified hip: Secondary | ICD-10-CM | POA: Diagnosis not present

## 2021-06-06 DIAGNOSIS — Z7952 Long term (current) use of systemic steroids: Secondary | ICD-10-CM | POA: Insufficient documentation

## 2021-06-06 DIAGNOSIS — Z7984 Long term (current) use of oral hypoglycemic drugs: Secondary | ICD-10-CM | POA: Insufficient documentation

## 2021-06-06 DIAGNOSIS — W19XXXA Unspecified fall, initial encounter: Secondary | ICD-10-CM | POA: Insufficient documentation

## 2021-06-06 HISTORY — DX: Other specified fracture of right pubis, initial encounter for closed fracture: S32.591A

## 2021-06-06 HISTORY — DX: Personal history of (healed) traumatic fracture: Z87.81

## 2021-06-06 MED ORDER — METHOCARBAMOL 500 MG PO TABS
500.0000 mg | ORAL_TABLET | Freq: Once | ORAL | Status: AC
  Administered 2021-06-06: 500 mg via ORAL
  Filled 2021-06-06: qty 1

## 2021-06-06 MED ORDER — LIDOCAINE 5 % EX PTCH
2.0000 | MEDICATED_PATCH | CUTANEOUS | Status: DC
  Administered 2021-06-06: 2 via TRANSDERMAL

## 2021-06-06 MED ORDER — METHOCARBAMOL 500 MG PO TABS: 500.0000 mg | ORAL_TABLET | Freq: Two times a day (BID) | ORAL | 0 refills | Status: DC

## 2021-06-06 MED ORDER — LIDOCAINE 5 % EX PTCH
1.0000 | MEDICATED_PATCH | CUTANEOUS | Status: DC
  Administered 2021-06-06: 1 via TRANSDERMAL
  Filled 2021-06-06 (×2): qty 1

## 2021-06-06 MED ORDER — OXYCODONE-ACETAMINOPHEN 5-325 MG PO TABS
1.0000 | ORAL_TABLET | Freq: Once | ORAL | Status: AC
  Administered 2021-06-06: 1 via ORAL
  Filled 2021-06-06: qty 1

## 2021-06-06 NOTE — ED Triage Notes (Signed)
Pt presents to ED via [facility] for severe right hip pain x 1 month.  ?

## 2021-06-06 NOTE — ED Provider Notes (Signed)
[facility] EMERGENCY DEPARTMENT Provider Note   CSN: [id number] Arrival date & time: 06/06/21  1136     History  Chief Complaint  Patient presents with   Hip Pain    Gina Bradley is a 61 y.o. female with a past medical history significant for SVT, hypertension, COPD, GERD, and chronic pain syndrome who presents to the ED due to right low back and hip pain x1 month.  Patient notes she fell roughly 1 month ago and has been having ongoing pain since.  Pain worsened the past few days. Patient denies any chronic back or hip pain.  Admits to some right lower extremity weakness.  Denies saddle paresthesias, bowel/bladder incontinence, lower extremity numbness/tingling.  Patient states pain radiates from right buttocks region down posteriorly to right knee.  Denies fever and chills.  No history of IV drug use.  Denies associated abdominal pain.  No urinary symptoms.  Patient takes chronic hydrocodone. Patient notes she now ambulates with a walker due to pain.       Home Medications Prior to Admission medications   Medication Sig Start Date End Date Taking? Authorizing Provider  methocarbamol (ROBAXIN) 500 MG tablet Take 1 tablet (500 mg total) by mouth 2 (two) times daily. 06/06/21  Yes Severo Beber C, PA-C  albuterol (PROVENTIL HFA;VENTOLIN HFA) 108 (90 Base) MCG/ACT inhaler Inhale 1 puff into the lungs every 6 (six) hours as needed for wheezing or shortness of breath.    [provider]  albuterol (PROVENTIL) (2.5 MG/3ML) 0.083% nebulizer solution Take 2.5 mg by nebulization daily as needed.    [provider]  ANORO ELLIPTA 62.5-25 MCG/INH AEPB Inhale 1 puff into the lungs daily.  09/09/16   [provider]  aspirin EC 81 MG EC tablet Take 1 tablet (81 mg total) by mouth daily. 06/03/13   Lonia Blood, MD  atorvastatin (LIPITOR) 20 MG tablet Take 20 mg by mouth every morning. 03/04/13   [provider]  co-enzyme Q-10 30 MG capsule Take 100 mg by  mouth daily.    [provider]  cyclobenzaprine (FLEXERIL) 5 MG tablet Take 1 tablet by mouth daily as needed for muscle pain. 05/11/20   [provider]  EPINEPHrine (EPIPEN 2-PAK) 0.3 mg/0.3 mL IJ SOAJ injection Inject 0.3 mLs (0.3 mg total) into the muscle once as needed (for severe allergic reaction). CAll 911 immediately if you have to use this medicine 10/21/15   Eber Hong, MD  fluticasone [facility]) 50 MCG/ACT nasal spray Place 2 sprays into both nostrils daily.    [provider]  furosemide (LASIX) 20 MG tablet TAKE (1) TABLET BY MOUTH 2 TIMES DAILY AS NEEDED. Patient taking differently: Take 20 mg by mouth daily. 08/23/19   Marinus Maw, MD  gabapentin (NEURONTIN) 100 MG capsule Take 2 capsules (200 mg total) by mouth 3 (three) times daily. 07/04/19   Vassie Loll, MD  HYDROcodone-acetaminophen [facility]) 10-325 MG tablet Take 1 tablet by mouth every 6 (six) hours as needed for severe pain. pain 07/04/19   Vassie Loll, MD  isosorbide mononitrate (ISMO) 10 MG tablet Take 10 mg by mouth daily.    [provider]  losartan (COZAAR) 25 MG tablet Take 25 mg by mouth daily.    [provider]  metFORMIN (GLUCOPHAGE) 500 MG tablet Take 500 mg by mouth daily. 11/30/16   [provider]  metoprolol succinate (TOPROL-XL) 100 MG 24 hr tablet Take 1 tablet (100 mg total) by mouth daily. Take  with or immediately following a meal. 06/30/20 09/28/20  Dyann Kief, PA-C  nitroGLYCERIN (NITROSTAT) 0.4 MG SL tablet Place 1 tablet (0.4 mg total) under the tongue every 5 (five) minutes as needed for chest pain. 12/22/14   Rolland Porter, MD  Oxycodone HCl 10 MG TABS Take 10 mg by mouth 4 (four) times daily as needed. 09/10/19   [provider]  pantoprazole (PROTONIX) 40 MG tablet Take 40 mg by mouth every morning. 05/25/13   [provider]  predniSONE (DELTASONE) 5 MG tablet Take 1 tablet (5 mg total) by mouth daily with breakfast. 10/22/19    Nyoka Cowden, MD  primidone (MYSOLINE) 50 MG tablet Take 50 mg by mouth at bedtime.  04/06/19   [provider]      Allergies    Bee venom and Tdap [tetanus-diphth-acell pertussis]    Review of Systems   Review of Systems  Gastrointestinal:  Negative for abdominal pain.  Genitourinary:  Negative for dysuria.  Musculoskeletal:  Positive for arthralgias, back pain and gait problem.   Physical Exam Updated Vital Signs BP 131/74 (BP Location: Left Arm)    Pulse 98    Temp 98.3 F (36.8 C) (Oral)    Resp 20    Ht 5\' 1"  (1.549 m)    Wt 99.8 kg    LMP 01/05/2015    SpO2 98%    BMI 41.57 kg/m  Physical Exam Vitals and nursing note reviewed.  Constitutional:      General: She is not in acute distress.    Appearance: She is not ill-appearing.  HENT:     Head: Normocephalic.  Eyes:     Pupils: Pupils are equal, round, and reactive to light.  Cardiovascular:     Rate and Rhythm: Normal rate and regular rhythm.     Pulses: Normal pulses.     Heart sounds: Normal heart sounds. No murmur heard.   No friction rub. No gallop.  Pulmonary:     Effort: Pulmonary effort is normal.     Breath sounds: Normal breath sounds.  Abdominal:     General: Abdomen is flat. There is no distension.     Palpations: Abdomen is soft.     Tenderness: There is no abdominal tenderness. There is no guarding or rebound.  Musculoskeletal:        General: Normal range of motion.     Cervical back: Neck supple.     Comments: No thoracic or lumbar midline tenderness.  Reproducible tenderness to right lumbar paraspinal region into right buttocks.  Bilateral lower extremities neurovascularly intact.  Skin:    General: Skin is warm and dry.  Neurological:     General: No focal deficit present.     Mental Status: She is alert.  Psychiatric:        Mood and Affect: Mood normal.        Behavior: Behavior normal.    ED Results / Procedures / Treatments   Labs (all labs ordered are listed, but only  abnormal results are displayed) Labs Reviewed - No data to display  EKG None  Radiology DG Lumbar Spine Complete  Result Date: 06/06/2021 CLINICAL DATA:  Right hip and low back pain EXAM: LUMBAR SPINE - COMPLETE 4+ VIEW COMPARISON:  08/23/2017. FINDINGS: Normal alignment. There is a mild, age-indeterminate compression fracture involving the L4 vertebral body. There is loss of approximately 20% of the superior endplate height. This is new when compared with 08/23/2017. Mild multilevel ventral endplate spurring  noted. Aortic atherosclerotic calcifications identified. IMPRESSION: 1. Age-indeterminate compression deformity involves the L4 vertebral body and is new from 08/23/2017. Electronically Signed   By: Signa Kellaylor  Stroud M.D.   On: 06/06/2021 12:45   DG Hip Unilat W or Wo Pelvis 2-3 Views Right  Result Date: 06/06/2021 CLINICAL DATA:  Right hip and low back pain for months. EXAM: DG HIP (WITH OR WITHOUT PELVIS) 2-3V RIGHT COMPARISON:  None. FINDINGS: Both hips appear located. No signs of hip fracture. There is a fracture deformity involving the right inferior pubic rami. The fracture line remains distinct. Mild surrounding callus formation is noted. IMPRESSION: Subacute fracture deformity is noted involving the right inferior pubic rami. Electronically Signed   By: Signa Kellaylor  Stroud M.D.   On: 06/06/2021 12:39    Procedures Procedures    Medications Ordered in ED Medications  lidocaine (LIDODERM) 5 % 1 patch (1 patch Transdermal Patch Applied 06/06/21 1240)  oxyCODONE-acetaminophen (PERCOCET/ROXICET) 5-325 MG per tablet 1 tablet (1 tablet Oral Given 06/06/21 1240)  methocarbamol (ROBAXIN) tablet 500 mg (500 mg Oral Given 06/06/21 1323)    ED Course/ Medical Decision Making/ A&P                           Medical Decision Making Amount and/or Complexity of Data Reviewed Radiology: ordered and independent interpretation performed. Decision-making details documented in ED Course.  Risk Prescription  drug management.   61 year old female presents to the ED due to right-sided low back and hip pain x1 month.  Patient notes she had a fall roughly 1 month ago and has been having ongoing pain since.  History of chronic pain syndrome on chronic hydrocodone.  Denies saddle paresthesias, bowel/bladder incontinence, lower extremity numbness/tingling.  No fever or chills.  Denies IV drug use.  Upon arrival, vitals all within normal limits.  Patient in no acute distress.  Reproducible tenderness throughout right lumbar paraspinal region into right buttocks.  Bilateral lower extremity neurovascularly intact with soft compartments.  Low suspicion for cauda equina or central cord compression.  No infectious symptoms to suggest infectious etiology.  Possible lumbar radiculopathy vs. Bony fractures from previous fall. Will obtain x-rays given fall 1 month ago.  Oxycodone and Lidoderm patch placed.  X-rays personally reviewed and interpreted which demonstrate age-indeterminate compression deformity involving the L4 vertebrae.  X-ray also demonstrates a subacute fracture of the right inferior pubic rami.  Patient able to ambulate here in the ED without difficulty.  Low suspicion for cauda equina or central cord compression.  Given fracture most likely occurred 1 month ago, feel patient is stable for discharge with outpatient orthopedic follow-up. Patient on hydrocodone and advised to take as previously prescribed.  Will discharge with Robaxin. Strict ED precautions discussed with patient. Patient states understanding and agrees to plan. Patient discharged home in no acute distress and stable vitals.  Discussed with Dr. Hyacinth MeekerMiller who agrees with assessment and plan.         Final Clinical Impression(s) / ED Diagnoses Final diagnoses:  Right hip pain  Closed fracture of fourth lumbar vertebra, unspecified fracture morphology, initial encounter [facility](HCC)    Rx / DC Orders ED Discharge Orders          Ordered     methocarbamol (ROBAXIN) 500 MG tablet  2 times daily        06/06/21 1344              Mannie Stabileberman, Jossalyn Forgione C, [place]PA-C 06/06/21 1348  Eber Hong, MD 06/07/21 2035

## 2021-06-06 NOTE — Discharge Instructions (Signed)
It was a pleasure taking care of you today. As discussed, your x-rays showed old broken bones probably from your recent fall. I have included the number of the orthopedic surgeon. Call to schedule an appointment for further evaluation.  Continue to take your hydrocodone as previously prescribed.  I am sending you home with a muscle relaxer.  Take as needed for pain.  Muscle relaxer can cause drowsiness so do not drive or operate machinery while the medication.  You may also purchase over-the-counter Lidoderm patches and Voltaren gel for added pain relief.  Return to the ER for any worsening symptoms. ?

## 2021-06-06 NOTE — ED Notes (Signed)
Dc instructions and scripts reviewed with pt to follow up with ortho. Pt wheeled to lobby to wait for husband to transport home.  ?

## 2021-06-09 DIAGNOSIS — E1140 Type 2 diabetes mellitus with diabetic neuropathy, unspecified: Secondary | ICD-10-CM | POA: Diagnosis not present

## 2021-06-09 DIAGNOSIS — M1991 Primary osteoarthritis, unspecified site: Secondary | ICD-10-CM | POA: Diagnosis not present

## 2021-06-09 DIAGNOSIS — G894 Chronic pain syndrome: Secondary | ICD-10-CM | POA: Diagnosis not present

## 2021-06-09 DIAGNOSIS — I10 Essential (primary) hypertension: Secondary | ICD-10-CM | POA: Diagnosis not present

## 2021-06-09 DIAGNOSIS — J449 Chronic obstructive pulmonary disease, unspecified: Secondary | ICD-10-CM | POA: Diagnosis not present

## 2021-06-22 ENCOUNTER — Inpatient Hospital Stay (HOSPITAL_COMMUNITY): Payer: 59

## 2021-06-22 ENCOUNTER — Emergency Department (HOSPITAL_COMMUNITY): Payer: 59

## 2021-06-22 ENCOUNTER — Encounter (HOSPITAL_COMMUNITY): Payer: Self-pay | Admitting: Emergency Medicine

## 2021-06-22 ENCOUNTER — Other Ambulatory Visit: Payer: Self-pay

## 2021-06-22 ENCOUNTER — Inpatient Hospital Stay (HOSPITAL_COMMUNITY)
Admission: EM | Admit: 2021-06-22 | Discharge: 2021-08-05 | DRG: 003 | Disposition: A | Discharge status: Skilled Nursing Facility | Payer: 59 | Attending: Internal Medicine | Admitting: Internal Medicine

## 2021-06-22 DIAGNOSIS — Z7189 Other specified counseling: Secondary | ICD-10-CM | POA: Diagnosis not present

## 2021-06-22 DIAGNOSIS — M8448XA Pathological fracture, other site, initial encounter for fracture: Secondary | ICD-10-CM | POA: Diagnosis present

## 2021-06-22 DIAGNOSIS — A419 Sepsis, unspecified organism: Principal | ICD-10-CM

## 2021-06-22 DIAGNOSIS — Z6841 Body Mass Index (BMI) 40.0 and over, adult: Secondary | ICD-10-CM

## 2021-06-22 DIAGNOSIS — R0600 Dyspnea, unspecified: Secondary | ICD-10-CM | POA: Diagnosis not present

## 2021-06-22 DIAGNOSIS — E871 Hypo-osmolality and hyponatremia: Secondary | ICD-10-CM | POA: Diagnosis not present

## 2021-06-22 DIAGNOSIS — E11649 Type 2 diabetes mellitus with hypoglycemia without coma: Secondary | ICD-10-CM | POA: Diagnosis not present

## 2021-06-22 DIAGNOSIS — K219 Gastro-esophageal reflux disease without esophagitis: Secondary | ICD-10-CM | POA: Diagnosis present

## 2021-06-22 DIAGNOSIS — R1310 Dysphagia, unspecified: Secondary | ICD-10-CM | POA: Diagnosis present

## 2021-06-22 DIAGNOSIS — R410 Disorientation, unspecified: Secondary | ICD-10-CM | POA: Diagnosis not present

## 2021-06-22 DIAGNOSIS — E870 Hyperosmolality and hypernatremia: Secondary | ICD-10-CM | POA: Diagnosis not present

## 2021-06-22 DIAGNOSIS — F1123 Opioid dependence with withdrawal: Secondary | ICD-10-CM | POA: Diagnosis present

## 2021-06-22 DIAGNOSIS — R21 Rash and other nonspecific skin eruption: Secondary | ICD-10-CM | POA: Diagnosis not present

## 2021-06-22 DIAGNOSIS — G9341 Metabolic encephalopathy: Secondary | ICD-10-CM | POA: Diagnosis not present

## 2021-06-22 DIAGNOSIS — E781 Pure hyperglyceridemia: Secondary | ICD-10-CM | POA: Diagnosis not present

## 2021-06-22 DIAGNOSIS — I890 Lymphedema, not elsewhere classified: Secondary | ICD-10-CM | POA: Diagnosis present

## 2021-06-22 DIAGNOSIS — I110 Hypertensive heart disease with heart failure: Secondary | ICD-10-CM | POA: Diagnosis not present

## 2021-06-22 DIAGNOSIS — Z765 Malingerer [conscious simulation]: Secondary | ICD-10-CM

## 2021-06-22 DIAGNOSIS — Z887 Allergy status to serum and vaccine status: Secondary | ICD-10-CM

## 2021-06-22 DIAGNOSIS — K2100 Gastro-esophageal reflux disease with esophagitis, without bleeding: Secondary | ICD-10-CM | POA: Diagnosis not present

## 2021-06-22 DIAGNOSIS — F23 Brief psychotic disorder: Secondary | ICD-10-CM | POA: Diagnosis present

## 2021-06-22 DIAGNOSIS — S32599A Other specified fracture of unspecified pubis, initial encounter for closed fracture: Secondary | ICD-10-CM | POA: Diagnosis present

## 2021-06-22 DIAGNOSIS — E874 Mixed disorder of acid-base balance: Secondary | ICD-10-CM | POA: Diagnosis not present

## 2021-06-22 DIAGNOSIS — R0602 Shortness of breath: Secondary | ICD-10-CM | POA: Diagnosis not present

## 2021-06-22 DIAGNOSIS — J189 Pneumonia, unspecified organism: Secondary | ICD-10-CM | POA: Diagnosis not present

## 2021-06-22 DIAGNOSIS — Z9911 Dependence on respirator [ventilator] status: Secondary | ICD-10-CM | POA: Diagnosis not present

## 2021-06-22 DIAGNOSIS — E1165 Type 2 diabetes mellitus with hyperglycemia: Secondary | ICD-10-CM | POA: Diagnosis not present

## 2021-06-22 DIAGNOSIS — F05 Delirium due to known physiological condition: Secondary | ICD-10-CM | POA: Diagnosis not present

## 2021-06-22 DIAGNOSIS — I10 Essential (primary) hypertension: Secondary | ICD-10-CM | POA: Diagnosis present

## 2021-06-22 DIAGNOSIS — J441 Chronic obstructive pulmonary disease with (acute) exacerbation: Secondary | ICD-10-CM | POA: Diagnosis not present

## 2021-06-22 DIAGNOSIS — R413 Other amnesia: Secondary | ICD-10-CM | POA: Diagnosis present

## 2021-06-22 DIAGNOSIS — I5032 Chronic diastolic (congestive) heart failure: Secondary | ICD-10-CM | POA: Diagnosis not present

## 2021-06-22 DIAGNOSIS — Z833 Family history of diabetes mellitus: Secondary | ICD-10-CM

## 2021-06-22 DIAGNOSIS — I2510 Atherosclerotic heart disease of native coronary artery without angina pectoris: Secondary | ICD-10-CM | POA: Diagnosis present

## 2021-06-22 DIAGNOSIS — H0520 Unspecified exophthalmos: Secondary | ICD-10-CM | POA: Diagnosis not present

## 2021-06-22 DIAGNOSIS — K761 Chronic passive congestion of liver: Secondary | ICD-10-CM | POA: Diagnosis present

## 2021-06-22 DIAGNOSIS — J9622 Acute and chronic respiratory failure with hypercapnia: Secondary | ICD-10-CM | POA: Diagnosis present

## 2021-06-22 DIAGNOSIS — N2889 Other specified disorders of kidney and ureter: Secondary | ICD-10-CM | POA: Diagnosis not present

## 2021-06-22 DIAGNOSIS — R0902 Hypoxemia: Secondary | ICD-10-CM | POA: Diagnosis not present

## 2021-06-22 DIAGNOSIS — R7881 Bacteremia: Secondary | ICD-10-CM | POA: Diagnosis not present

## 2021-06-22 DIAGNOSIS — M4802 Spinal stenosis, cervical region: Secondary | ICD-10-CM | POA: Diagnosis present

## 2021-06-22 DIAGNOSIS — J159 Unspecified bacterial pneumonia: Secondary | ICD-10-CM | POA: Diagnosis present

## 2021-06-22 DIAGNOSIS — R7401 Elevation of levels of liver transaminase levels: Secondary | ICD-10-CM | POA: Diagnosis not present

## 2021-06-22 DIAGNOSIS — E785 Hyperlipidemia, unspecified: Secondary | ICD-10-CM | POA: Diagnosis present

## 2021-06-22 DIAGNOSIS — R6520 Severe sepsis without septic shock: Secondary | ICD-10-CM | POA: Diagnosis not present

## 2021-06-22 DIAGNOSIS — G8929 Other chronic pain: Secondary | ICD-10-CM | POA: Diagnosis present

## 2021-06-22 DIAGNOSIS — K5900 Constipation, unspecified: Secondary | ICD-10-CM | POA: Diagnosis not present

## 2021-06-22 DIAGNOSIS — J440 Chronic obstructive pulmonary disease with acute lower respiratory infection: Secondary | ICD-10-CM | POA: Diagnosis not present

## 2021-06-22 DIAGNOSIS — Z91030 Bee allergy status: Secondary | ICD-10-CM

## 2021-06-22 DIAGNOSIS — Z20822 Contact with and (suspected) exposure to covid-19: Secondary | ICD-10-CM | POA: Diagnosis present

## 2021-06-22 DIAGNOSIS — E119 Type 2 diabetes mellitus without complications: Secondary | ICD-10-CM

## 2021-06-22 DIAGNOSIS — T827XXD Infection and inflammatory reaction due to other cardiac and vascular devices, implants and grafts, subsequent encounter: Secondary | ICD-10-CM | POA: Diagnosis not present

## 2021-06-22 DIAGNOSIS — T502X5A Adverse effect of carbonic-anhydrase inhibitors, benzothiadiazides and other diuretics, initial encounter: Secondary | ICD-10-CM | POA: Diagnosis not present

## 2021-06-22 DIAGNOSIS — I471 Supraventricular tachycardia, unspecified: Secondary | ICD-10-CM

## 2021-06-22 DIAGNOSIS — I499 Cardiac arrhythmia, unspecified: Secondary | ICD-10-CM | POA: Diagnosis not present

## 2021-06-22 DIAGNOSIS — I5030 Unspecified diastolic (congestive) heart failure: Secondary | ICD-10-CM | POA: Diagnosis not present

## 2021-06-22 DIAGNOSIS — M7989 Other specified soft tissue disorders: Secondary | ICD-10-CM | POA: Diagnosis not present

## 2021-06-22 DIAGNOSIS — R4182 Altered mental status, unspecified: Secondary | ICD-10-CM

## 2021-06-22 DIAGNOSIS — Z452 Encounter for adjustment and management of vascular access device: Secondary | ICD-10-CM | POA: Diagnosis not present

## 2021-06-22 DIAGNOSIS — M549 Dorsalgia, unspecified: Secondary | ICD-10-CM | POA: Diagnosis not present

## 2021-06-22 DIAGNOSIS — Z7982 Long term (current) use of aspirin: Secondary | ICD-10-CM

## 2021-06-22 DIAGNOSIS — B9781 Human metapneumovirus as the cause of diseases classified elsewhere: Secondary | ICD-10-CM | POA: Diagnosis present

## 2021-06-22 DIAGNOSIS — I252 Old myocardial infarction: Secondary | ICD-10-CM

## 2021-06-22 DIAGNOSIS — F1120 Opioid dependence, uncomplicated: Secondary | ICD-10-CM | POA: Diagnosis not present

## 2021-06-22 DIAGNOSIS — R6521 Severe sepsis with septic shock: Secondary | ICD-10-CM | POA: Diagnosis present

## 2021-06-22 DIAGNOSIS — J9601 Acute respiratory failure with hypoxia: Secondary | ICD-10-CM | POA: Diagnosis not present

## 2021-06-22 DIAGNOSIS — E1140 Type 2 diabetes mellitus with diabetic neuropathy, unspecified: Secondary | ICD-10-CM | POA: Diagnosis not present

## 2021-06-22 DIAGNOSIS — Z79899 Other long term (current) drug therapy: Secondary | ICD-10-CM

## 2021-06-22 DIAGNOSIS — J9690 Respiratory failure, unspecified, unspecified whether with hypoxia or hypercapnia: Secondary | ICD-10-CM | POA: Diagnosis not present

## 2021-06-22 DIAGNOSIS — Z930 Tracheostomy status: Secondary | ICD-10-CM | POA: Diagnosis not present

## 2021-06-22 DIAGNOSIS — R233 Spontaneous ecchymoses: Secondary | ICD-10-CM | POA: Diagnosis present

## 2021-06-22 DIAGNOSIS — Z86718 Personal history of other venous thrombosis and embolism: Secondary | ICD-10-CM

## 2021-06-22 DIAGNOSIS — I5033 Acute on chronic diastolic (congestive) heart failure: Secondary | ICD-10-CM | POA: Diagnosis not present

## 2021-06-22 DIAGNOSIS — B970 Adenovirus as the cause of diseases classified elsewhere: Secondary | ICD-10-CM | POA: Diagnosis present

## 2021-06-22 DIAGNOSIS — B3731 Acute candidiasis of vulva and vagina: Secondary | ICD-10-CM | POA: Diagnosis present

## 2021-06-22 DIAGNOSIS — Z515 Encounter for palliative care: Secondary | ICD-10-CM | POA: Diagnosis not present

## 2021-06-22 DIAGNOSIS — M5090 Cervical disc disorder, unspecified, unspecified cervical region: Secondary | ICD-10-CM | POA: Diagnosis present

## 2021-06-22 DIAGNOSIS — Z4682 Encounter for fitting and adjustment of non-vascular catheter: Secondary | ICD-10-CM | POA: Diagnosis not present

## 2021-06-22 DIAGNOSIS — J690 Pneumonitis due to inhalation of food and vomit: Secondary | ICD-10-CM | POA: Diagnosis present

## 2021-06-22 DIAGNOSIS — I517 Cardiomegaly: Secondary | ICD-10-CM | POA: Diagnosis not present

## 2021-06-22 DIAGNOSIS — J449 Chronic obstructive pulmonary disease, unspecified: Secondary | ICD-10-CM

## 2021-06-22 DIAGNOSIS — E876 Hypokalemia: Secondary | ICD-10-CM | POA: Diagnosis present

## 2021-06-22 DIAGNOSIS — J15211 Pneumonia due to Methicillin susceptible Staphylococcus aureus: Secondary | ICD-10-CM | POA: Diagnosis present

## 2021-06-22 DIAGNOSIS — R918 Other nonspecific abnormal finding of lung field: Secondary | ICD-10-CM | POA: Diagnosis not present

## 2021-06-22 DIAGNOSIS — R627 Adult failure to thrive: Secondary | ICD-10-CM | POA: Diagnosis not present

## 2021-06-22 DIAGNOSIS — Z7952 Long term (current) use of systemic steroids: Secondary | ICD-10-CM

## 2021-06-22 DIAGNOSIS — Z87891 Personal history of nicotine dependence: Secondary | ICD-10-CM

## 2021-06-22 DIAGNOSIS — I700 Atherosclerosis of aorta: Secondary | ICD-10-CM | POA: Diagnosis not present

## 2021-06-22 DIAGNOSIS — T380X5A Adverse effect of glucocorticoids and synthetic analogues, initial encounter: Secondary | ICD-10-CM | POA: Diagnosis present

## 2021-06-22 DIAGNOSIS — E1142 Type 2 diabetes mellitus with diabetic polyneuropathy: Secondary | ICD-10-CM | POA: Diagnosis not present

## 2021-06-22 DIAGNOSIS — J9602 Acute respiratory failure with hypercapnia: Secondary | ICD-10-CM

## 2021-06-22 DIAGNOSIS — G629 Polyneuropathy, unspecified: Secondary | ICD-10-CM

## 2021-06-22 DIAGNOSIS — Z8249 Family history of ischemic heart disease and other diseases of the circulatory system: Secondary | ICD-10-CM

## 2021-06-22 DIAGNOSIS — J9621 Acute and chronic respiratory failure with hypoxia: Secondary | ICD-10-CM | POA: Diagnosis not present

## 2021-06-22 DIAGNOSIS — I21A1 Myocardial infarction type 2: Secondary | ICD-10-CM | POA: Diagnosis present

## 2021-06-22 DIAGNOSIS — R509 Fever, unspecified: Secondary | ICD-10-CM | POA: Diagnosis not present

## 2021-06-22 DIAGNOSIS — D6489 Other specified anemias: Secondary | ICD-10-CM | POA: Diagnosis present

## 2021-06-22 DIAGNOSIS — R404 Transient alteration of awareness: Secondary | ICD-10-CM | POA: Diagnosis not present

## 2021-06-22 DIAGNOSIS — R9389 Abnormal findings on diagnostic imaging of other specified body structures: Secondary | ICD-10-CM | POA: Diagnosis not present

## 2021-06-22 DIAGNOSIS — J984 Other disorders of lung: Secondary | ICD-10-CM | POA: Diagnosis not present

## 2021-06-22 DIAGNOSIS — R945 Abnormal results of liver function studies: Secondary | ICD-10-CM | POA: Diagnosis not present

## 2021-06-22 DIAGNOSIS — E66813 Obesity, class 3: Secondary | ICD-10-CM

## 2021-06-22 DIAGNOSIS — Z7984 Long term (current) use of oral hypoglycemic drugs: Secondary | ICD-10-CM

## 2021-06-22 DIAGNOSIS — Z743 Need for continuous supervision: Secondary | ICD-10-CM | POA: Diagnosis not present

## 2021-06-22 DIAGNOSIS — J90 Pleural effusion, not elsewhere classified: Secondary | ICD-10-CM | POA: Diagnosis not present

## 2021-06-22 DIAGNOSIS — I5031 Acute diastolic (congestive) heart failure: Secondary | ICD-10-CM | POA: Diagnosis not present

## 2021-06-22 DIAGNOSIS — R000 Tachycardia, unspecified: Secondary | ICD-10-CM | POA: Diagnosis not present

## 2021-06-22 DIAGNOSIS — R52 Pain, unspecified: Secondary | ICD-10-CM | POA: Diagnosis not present

## 2021-06-22 LAB — RAPID URINE DRUG SCREEN, HOSP PERFORMED
Amphetamines: NOT DETECTED
Barbiturates: POSITIVE — AB
Benzodiazepines: POSITIVE — AB
Cocaine: NOT DETECTED
Opiates: POSITIVE — AB
Tetrahydrocannabinol: POSITIVE — AB

## 2021-06-22 LAB — URINALYSIS, ROUTINE W REFLEX MICROSCOPIC
Bacteria, UA: NONE SEEN
Bilirubin Urine: NEGATIVE
Glucose, UA: 50 mg/dL — AB
Ketones, ur: NEGATIVE mg/dL
Leukocytes,Ua: NEGATIVE
Nitrite: NEGATIVE
Protein, ur: 100 mg/dL — AB
RBC / HPF: >50 RBC/hpf — ABNORMAL HIGH (ref 0–5)
Specific Gravity, Urine: >1.046 — ABNORMAL HIGH (ref 1.005–1.030)
pH: 5 (ref 5.0–8.0)

## 2021-06-22 LAB — CBC
HCT: 47.3 % — ABNORMAL HIGH (ref 36.0–46.0)
Hemoglobin: 15.1 g/dL — ABNORMAL HIGH (ref 12.0–15.0)
MCH: 32.1 pg (ref 26.0–34.0)
MCHC: 31.9 g/dL (ref 30.0–36.0)
MCV: 100.4 fL — ABNORMAL HIGH (ref 80.0–100.0)
Platelets: 225 10*3/uL (ref 150–400)
RBC: 4.71 MIL/uL (ref 3.87–5.11)
RDW: 15.6 % — ABNORMAL HIGH (ref 11.5–15.5)
WBC: 24.4 10*3/uL — ABNORMAL HIGH (ref 4.0–10.5)
nRBC: 0.3 % — ABNORMAL HIGH (ref 0.0–0.2)

## 2021-06-22 LAB — PROTIME-INR
INR: 1 (ref 0.8–1.2)
Prothrombin Time: 12.9 seconds (ref 11.4–15.2)

## 2021-06-22 LAB — BLOOD GAS, VENOUS
Acid-base deficit: 0.8 mmol/L (ref 0.0–2.0)
Bicarbonate: 25.8 mmol/L (ref 20.0–28.0)
Drawn by: 6643
FIO2: 80 %
O2 Saturation: 44.9 %
Patient temperature: 40
pCO2, Ven: 56 mmHg (ref 44–60)
pH, Ven: 7.29 (ref 7.25–7.43)
pO2, Ven: 37 mmHg (ref 32–45)

## 2021-06-22 LAB — BLOOD GAS, ARTERIAL
Acid-Base Excess: 0 mmol/L (ref 0.0–2.0)
Bicarbonate: 27.9 mmol/L (ref 20.0–28.0)
Drawn by: 21310
FIO2: 60 %
O2 Saturation: 92.8 %
Patient temperature: 39.5
pCO2 arterial: 65 mmHg — ABNORMAL HIGH (ref 32–48)
pH, Arterial: 7.26 — ABNORMAL LOW (ref 7.35–7.45)
pO2, Arterial: 82 mmHg — ABNORMAL LOW (ref 83–108)

## 2021-06-22 LAB — TROPONIN I (HIGH SENSITIVITY)
Troponin I (High Sensitivity): 94 ng/L — ABNORMAL HIGH (ref <18)
Troponin I (High Sensitivity): 218 ng/L (ref <18)

## 2021-06-22 LAB — GLUCOSE, CAPILLARY
Glucose-Capillary: 120 mg/dL — ABNORMAL HIGH (ref 70–99)
Glucose-Capillary: 226 mg/dL — ABNORMAL HIGH (ref 70–99)
Glucose-Capillary: 239 mg/dL — ABNORMAL HIGH (ref 70–99)

## 2021-06-22 LAB — MRSA NEXT GEN BY PCR, NASAL: MRSA by PCR Next Gen: NOT DETECTED

## 2021-06-22 LAB — COMPREHENSIVE METABOLIC PANEL
ALT: 32 U/L (ref 0–44)
AST: 40 U/L (ref 15–41)
Albumin: 3.7 g/dL (ref 3.5–5.0)
Alkaline Phosphatase: 167 U/L — ABNORMAL HIGH (ref 38–126)
Anion gap: 19 — ABNORMAL HIGH (ref 5–15)
BUN: 19 mg/dL (ref 6–20)
CO2: 21 mmol/L — ABNORMAL LOW (ref 22–32)
Calcium: 8.6 mg/dL — ABNORMAL LOW (ref 8.9–10.3)
Chloride: 94 mmol/L — ABNORMAL LOW (ref 98–111)
Creatinine, Ser: 1.28 mg/dL — ABNORMAL HIGH (ref 0.44–1.00)
GFR, Estimated: 48 mL/min — ABNORMAL LOW (ref >60)
Glucose, Bld: 212 mg/dL — ABNORMAL HIGH (ref 70–99)
Potassium: 3.7 mmol/L (ref 3.5–5.1)
Sodium: 134 mmol/L — ABNORMAL LOW (ref 135–145)
Total Bilirubin: 0.7 mg/dL (ref 0.3–1.2)
Total Protein: 7.6 g/dL (ref 6.5–8.1)

## 2021-06-22 LAB — RESP PANEL BY RT-PCR (FLU A&B, COVID) ARPGX2
Influenza A by PCR: NEGATIVE
Influenza B by PCR: NEGATIVE
SARS Coronavirus 2 by RT PCR: NEGATIVE

## 2021-06-22 LAB — LACTIC ACID, PLASMA
Lactic Acid, Venous: 6 mmol/L (ref 0.5–1.9)
Lactic Acid, Venous: 1.4 mmol/L (ref 0.5–1.9)

## 2021-06-22 LAB — STREP PNEUMONIAE URINARY ANTIGEN: Strep Pneumo Urinary Antigen: NEGATIVE

## 2021-06-22 LAB — MAGNESIUM: Magnesium: 2 mg/dL (ref 1.7–2.4)

## 2021-06-22 MED ORDER — SODIUM CHLORIDE 0.9 % IV SOLN
2.0000 g | INTRAVENOUS | Status: DC
  Administered 2021-06-23 – 2021-06-26 (×4): 2 g via INTRAVENOUS
  Filled 2021-06-22 (×4): qty 20

## 2021-06-22 MED ORDER — ADENOSINE 6 MG/2ML IV SOLN: 6.0000 mg | Freq: Once | INTRAVENOUS | Status: AC

## 2021-06-22 MED ORDER — LACTATED RINGERS IV BOLUS
1000.0000 mL | Freq: Once | INTRAVENOUS | Status: AC
  Administered 2021-06-22: 1000 mL via INTRAVENOUS

## 2021-06-22 MED ORDER — ORAL CARE MOUTH RINSE
15.0000 mL | OROMUCOSAL | Status: DC
  Administered 2021-06-22 – 2021-07-15 (×221): 15 mL via OROMUCOSAL

## 2021-06-22 MED ORDER — LORAZEPAM 2 MG/ML IJ SOLN
1.0000 mg | Freq: Once | INTRAMUSCULAR | Status: AC | PRN
  Administered 2021-06-22: 1 mg via INTRAVENOUS

## 2021-06-22 MED ORDER — NOREPINEPHRINE 4 MG/250ML-% IV SOLN
0.0000 ug/min | INTRAVENOUS | Status: DC
  Administered 2021-06-22: 2 ug/min via INTRAVENOUS
  Administered 2021-06-23: 5 ug/min via INTRAVENOUS
  Filled 2021-06-22 (×2): qty 250

## 2021-06-22 MED ORDER — MIDAZOLAM HCL 2 MG/2ML IJ SOLN
2.0000 mg | INTRAMUSCULAR | Status: DC | PRN
  Filled 2021-06-22: qty 2

## 2021-06-22 MED ORDER — ROCURONIUM BROMIDE 10 MG/ML (PF) SYRINGE
PREFILLED_SYRINGE | INTRAVENOUS | Status: AC
  Administered 2021-06-22: 100 mg via INTRAVENOUS
  Filled 2021-06-22: qty 10

## 2021-06-22 MED ORDER — IPRATROPIUM-ALBUTEROL 0.5-2.5 (3) MG/3ML IN SOLN: 3.0000 mL | RESPIRATORY_TRACT | Status: DC | PRN

## 2021-06-22 MED ORDER — IOHEXOL 350 MG/ML SOLN
100.0000 mL | Freq: Once | INTRAVENOUS | Status: AC | PRN
  Administered 2021-06-22: 100 mL via INTRAVENOUS

## 2021-06-22 MED ORDER — PANTOPRAZOLE SODIUM 40 MG IV SOLR
40.0000 mg | Freq: Every day | INTRAVENOUS | Status: DC
  Administered 2021-06-22 – 2021-06-25 (×4): 40 mg via INTRAVENOUS
  Filled 2021-06-22 (×4): qty 10

## 2021-06-22 MED ORDER — SODIUM CHLORIDE 0.9 % IV SOLN
2.0000 g | Freq: Once | INTRAVENOUS | Status: AC
  Administered 2021-06-22: 2 g via INTRAVENOUS
  Filled 2021-06-22: qty 20

## 2021-06-22 MED ORDER — POLYETHYLENE GLYCOL 3350 17 G PO PACK
17.0000 g | PACK | Freq: Every day | ORAL | Status: DC
  Administered 2021-06-22 – 2021-07-07 (×10): 17 g
  Filled 2021-06-22 (×11): qty 1

## 2021-06-22 MED ORDER — ACETAMINOPHEN 650 MG RE SUPP
650.0000 mg | Freq: Once | RECTAL | Status: AC
  Administered 2021-06-22: 650 mg via RECTAL
  Filled 2021-06-22: qty 1

## 2021-06-22 MED ORDER — ADENOSINE 6 MG/2ML IV SOLN
INTRAVENOUS | Status: AC
  Administered 2021-06-22: 6 mg via INTRAVENOUS
  Filled 2021-06-22: qty 6

## 2021-06-22 MED ORDER — POLYETHYLENE GLYCOL 3350 17 G PO PACK: 17.0000 g | PACK | Freq: Every day | ORAL | Status: DC | PRN

## 2021-06-22 MED ORDER — LACTATED RINGERS IV SOLN: INTRAVENOUS | Status: DC

## 2021-06-22 MED ORDER — ADENOSINE 6 MG/2ML IV SOLN
12.0000 mg | Freq: Once | INTRAVENOUS | Status: AC
  Administered 2021-06-22: 12 mg via INTRAVENOUS

## 2021-06-22 MED ORDER — HYDROCORTISONE SOD SUC (PF) 100 MG IJ SOLR
100.0000 mg | Freq: Two times a day (BID) | INTRAMUSCULAR | Status: DC
Start: 2021-06-22 — End: 2021-06-24
  Administered 2021-06-22 – 2021-06-24 (×5): 100 mg via INTRAVENOUS
  Filled 2021-06-22 (×5): qty 2

## 2021-06-22 MED ORDER — PROPOFOL 1000 MG/100ML IV EMUL
5.0000 ug/kg/min | INTRAVENOUS | Status: DC
  Administered 2021-06-22: 5 ug/kg/min via INTRAVENOUS
  Filled 2021-06-22: qty 100

## 2021-06-22 MED ORDER — ROCURONIUM BROMIDE 50 MG/5ML IV SOLN
100.0000 mg | Freq: Once | INTRAVENOUS | Status: AC
  Filled 2021-06-22: qty 10

## 2021-06-22 MED ORDER — ADENOSINE 6 MG/2ML IV SOLN
INTRAVENOUS | Status: AC
  Administered 2021-06-22: 6 mg
  Filled 2021-06-22: qty 2

## 2021-06-22 MED ORDER — SODIUM CHLORIDE 0.9 % IV SOLN
100.0000 mg | Freq: Two times a day (BID) | INTRAVENOUS | Status: DC
  Administered 2021-06-23 – 2021-06-25 (×5): 100 mg via INTRAVENOUS
  Filled 2021-06-22 (×8): qty 100

## 2021-06-22 MED ORDER — DOCUSATE SODIUM 50 MG/5ML PO LIQD: 100.0000 mg | Freq: Every day | ORAL | Status: DC | PRN

## 2021-06-22 MED ORDER — CHLORHEXIDINE GLUCONATE CLOTH 2 % EX PADS
6.0000 | MEDICATED_PAD | Freq: Every day | CUTANEOUS | Status: DC
  Administered 2021-06-22 – 2021-08-04 (×48): 6 via TOPICAL

## 2021-06-22 MED ORDER — LORAZEPAM 2 MG/ML IJ SOLN
1.0000 mg | Freq: Once | INTRAMUSCULAR | Status: AC
  Administered 2021-06-22: 1 mg via INTRAVENOUS
  Filled 2021-06-22: qty 1

## 2021-06-22 MED ORDER — FENTANYL CITRATE PF 50 MCG/ML IJ SOSY
50.0000 ug | PREFILLED_SYRINGE | INTRAMUSCULAR | Status: DC | PRN
  Administered 2021-06-22 (×2): 50 ug via INTRAVENOUS

## 2021-06-22 MED ORDER — AMIODARONE HCL IN DEXTROSE 360-4.14 MG/200ML-% IV SOLN
30.0000 mg/h | INTRAVENOUS | Status: DC
  Administered 2021-06-23 – 2021-06-25 (×6): 30 mg/h via INTRAVENOUS
  Filled 2021-06-22 (×6): qty 200

## 2021-06-22 MED ORDER — SODIUM CHLORIDE 0.9 % IV BOLUS
1000.0000 mL | Freq: Once | INTRAVENOUS | Status: AC
  Administered 2021-06-22: 1000 mL via INTRAVENOUS

## 2021-06-22 MED ORDER — ONDANSETRON HCL 4 MG/2ML IJ SOLN
4.0000 mg | Freq: Four times a day (QID) | INTRAMUSCULAR | Status: DC | PRN
  Administered 2021-07-03 – 2021-08-03 (×11): 4 mg via INTRAVENOUS
  Filled 2021-06-22 (×11): qty 2

## 2021-06-22 MED ORDER — IPRATROPIUM-ALBUTEROL 0.5-2.5 (3) MG/3ML IN SOLN: 3.0000 mL | Freq: Four times a day (QID) | RESPIRATORY_TRACT | Status: DC

## 2021-06-22 MED ORDER — MIDAZOLAM HCL 2 MG/2ML IJ SOLN
2.0000 mg | INTRAMUSCULAR | Status: DC | PRN
  Administered 2021-06-22: 2 mg via INTRAVENOUS
  Filled 2021-06-22: qty 2

## 2021-06-22 MED ORDER — LORAZEPAM 2 MG/ML IJ SOLN
2.0000 mg | Freq: Once | INTRAMUSCULAR | Status: AC
  Administered 2021-06-22: 2 mg via INTRAVENOUS
  Filled 2021-06-22: qty 1

## 2021-06-22 MED ORDER — DOCUSATE SODIUM 50 MG/5ML PO LIQD
100.0000 mg | Freq: Two times a day (BID) | ORAL | Status: DC
  Administered 2021-06-22 – 2021-07-07 (×23): 100 mg
  Filled 2021-06-22 (×24): qty 10

## 2021-06-22 MED ORDER — METOPROLOL TARTRATE 5 MG/5ML IV SOLN
2.5000 mg | Freq: Four times a day (QID) | INTRAVENOUS | Status: DC
  Administered 2021-06-22: 2.5 mg via INTRAVENOUS
  Filled 2021-06-22: qty 5

## 2021-06-22 MED ORDER — FENTANYL 2500MCG IN NS 250ML (10MCG/ML) PREMIX INFUSION
0.0000 ug/h | INTRAVENOUS | Status: DC
  Administered 2021-06-22: 50 ug/h via INTRAVENOUS
  Administered 2021-06-23: 225 ug/h via INTRAVENOUS
  Administered 2021-06-23: 125 ug/h via INTRAVENOUS
  Administered 2021-06-24 – 2021-06-25 (×5): 300 ug/h via INTRAVENOUS
  Administered 2021-06-26: 400 ug/h via INTRAVENOUS
  Administered 2021-06-26 (×2): 300 ug/h via INTRAVENOUS
  Administered 2021-06-27 (×2): 350 ug/h via INTRAVENOUS
  Administered 2021-06-27: 325 ug/h via INTRAVENOUS
  Administered 2021-06-28 (×2): 400 ug/h via INTRAVENOUS
  Administered 2021-06-28: 350 ug/h via INTRAVENOUS
  Administered 2021-06-28: 400 ug/h via INTRAVENOUS
  Administered 2021-06-29: 350 ug/h via INTRAVENOUS
  Administered 2021-06-29: 250 ug/h via INTRAVENOUS
  Administered 2021-06-30: 200 ug/h via INTRAVENOUS
  Administered 2021-06-30: 300 ug/h via INTRAVENOUS
  Administered 2021-06-30: 400 ug/h via INTRAVENOUS
  Administered 2021-07-01: 100 ug/h via INTRAVENOUS
  Administered 2021-07-01: 350 ug/h via INTRAVENOUS
  Administered 2021-07-02: 300 ug/h via INTRAVENOUS
  Administered 2021-07-02: 150 ug/h via INTRAVENOUS
  Administered 2021-07-03: 100 ug/h via INTRAVENOUS
  Administered 2021-07-04: 150 ug/h via INTRAVENOUS
  Administered 2021-07-05: 125 ug/h via INTRAVENOUS
  Administered 2021-07-05: 150 ug/h via INTRAVENOUS
  Filled 2021-06-22 (×32): qty 250

## 2021-06-22 MED ORDER — CHLORHEXIDINE GLUCONATE 0.12% ORAL RINSE (MEDLINE KIT)
15.0000 mL | Freq: Two times a day (BID) | OROMUCOSAL | Status: DC
  Administered 2021-06-22 – 2021-07-14 (×46): 15 mL via OROMUCOSAL

## 2021-06-22 MED ORDER — MIDAZOLAM HCL 2 MG/2ML IJ SOLN
2.0000 mg | INTRAMUSCULAR | Status: DC | PRN
  Administered 2021-06-22: 2 mg via INTRAVENOUS

## 2021-06-22 MED ORDER — ENOXAPARIN SODIUM 40 MG/0.4ML IJ SOSY
40.0000 mg | PREFILLED_SYRINGE | INTRAMUSCULAR | Status: DC
  Administered 2021-06-22 – 2021-06-27 (×6): 40 mg via SUBCUTANEOUS
  Filled 2021-06-22 (×6): qty 0.4

## 2021-06-22 MED ORDER — MIDAZOLAM HCL 2 MG/2ML IJ SOLN: 2.0000 mg | INTRAMUSCULAR | Status: DC | PRN

## 2021-06-22 MED ORDER — FENTANYL BOLUS VIA INFUSION
50.0000 ug | INTRAVENOUS | Status: DC | PRN
  Administered 2021-06-23: 50 ug via INTRAVENOUS
  Administered 2021-06-23 (×3): 100 ug via INTRAVENOUS
  Administered 2021-06-23: 50 ug via INTRAVENOUS
  Administered 2021-06-24 – 2021-06-29 (×17): 100 ug via INTRAVENOUS
  Administered 2021-06-30 (×2): 50 ug via INTRAVENOUS
  Administered 2021-06-30 – 2021-07-01 (×3): 100 ug via INTRAVENOUS
  Administered 2021-07-02 (×2): 50 ug via INTRAVENOUS
  Administered 2021-07-02: 75 ug via INTRAVENOUS
  Administered 2021-07-02 (×2): 100 ug via INTRAVENOUS
  Administered 2021-07-02 (×2): 75 ug via INTRAVENOUS
  Administered 2021-07-02: 100 ug via INTRAVENOUS
  Administered 2021-07-02: 50 ug via INTRAVENOUS
  Administered 2021-07-02 – 2021-07-03 (×7): 100 ug via INTRAVENOUS
  Administered 2021-07-03: 50 ug via INTRAVENOUS
  Administered 2021-07-03 – 2021-07-05 (×4): 100 ug via INTRAVENOUS
  Administered 2021-07-05 – 2021-07-06 (×2): 50 ug via INTRAVENOUS
  Administered 2021-07-06: 100 ug via INTRAVENOUS
  Filled 2021-06-22: qty 100

## 2021-06-22 MED ORDER — DOCUSATE SODIUM 100 MG PO CAPS: 100.0000 mg | ORAL_CAPSULE | Freq: Two times a day (BID) | ORAL | Status: DC | PRN

## 2021-06-22 MED ORDER — FENTANYL CITRATE PF 50 MCG/ML IJ SOSY
50.0000 ug | PREFILLED_SYRINGE | Freq: Once | INTRAMUSCULAR | Status: AC
  Administered 2021-06-22: 50 ug via INTRAVENOUS
  Filled 2021-06-22: qty 1

## 2021-06-22 MED ORDER — SODIUM CHLORIDE 0.9 % IV SOLN: 500.0000 mg | INTRAVENOUS | Status: DC

## 2021-06-22 MED ORDER — IPRATROPIUM-ALBUTEROL 0.5-2.5 (3) MG/3ML IN SOLN
3.0000 mL | RESPIRATORY_TRACT | Status: DC | PRN
  Administered 2021-06-23 – 2021-06-25 (×3): 3 mL via RESPIRATORY_TRACT
  Filled 2021-06-22 (×3): qty 3

## 2021-06-22 MED ORDER — FENTANYL CITRATE PF 50 MCG/ML IJ SOSY
50.0000 ug | PREFILLED_SYRINGE | INTRAMUSCULAR | Status: DC | PRN
  Administered 2021-06-22: 100 ug via INTRAVENOUS
  Filled 2021-06-22: qty 4

## 2021-06-22 MED ORDER — SODIUM CHLORIDE 0.9 % IV SOLN
500.0000 mg | INTRAVENOUS | Status: DC
  Administered 2021-06-22: 500 mg via INTRAVENOUS
  Filled 2021-06-22: qty 5

## 2021-06-22 MED ORDER — ACETAMINOPHEN 325 MG PO TABS
650.0000 mg | ORAL_TABLET | ORAL | Status: DC | PRN
  Administered 2021-06-24 – 2021-07-24 (×25): 650 mg
  Filled 2021-06-22 (×26): qty 2

## 2021-06-22 MED ORDER — METHYLPREDNISOLONE SODIUM SUCC 40 MG IJ SOLR
40.0000 mg | Freq: Two times a day (BID) | INTRAMUSCULAR | Status: DC
  Administered 2021-06-22: 40 mg via INTRAVENOUS
  Filled 2021-06-22: qty 1

## 2021-06-22 MED ORDER — AMIODARONE HCL IN DEXTROSE 360-4.14 MG/200ML-% IV SOLN
60.0000 mg/h | INTRAVENOUS | Status: AC
  Administered 2021-06-22 (×2): 60 mg/h via INTRAVENOUS
  Filled 2021-06-22 (×2): qty 200

## 2021-06-22 MED ORDER — ETOMIDATE 2 MG/ML IV SOLN
10.0000 mg | Freq: Once | INTRAVENOUS | Status: AC
Start: 2021-06-22 — End: 2021-06-22
  Administered 2021-06-22: 10 mg via INTRAVENOUS
  Filled 2021-06-22: qty 10

## 2021-06-22 MED ORDER — MIDAZOLAM-SODIUM CHLORIDE 100-0.9 MG/100ML-% IV SOLN
0.5000 mg/h | INTRAVENOUS | Status: DC
  Administered 2021-06-22: 0.5 mg/h via INTRAVENOUS
  Administered 2021-06-23: 5 mg/h via INTRAVENOUS
  Administered 2021-06-24: 9 mg/h via INTRAVENOUS
  Administered 2021-06-24: 8 mg/h via INTRAVENOUS
  Administered 2021-06-24: 10 mg/h via INTRAVENOUS
  Filled 2021-06-22 (×5): qty 100

## 2021-06-22 NOTE — Assessment & Plan Note (Signed)
--  possibly secondary to critical illness ?--she had to be intubated and sedated in order to get necessary testing completed ?--monitor closely ?--pCO2 was elevated at 60 ?

## 2021-06-22 NOTE — Progress Notes (Signed)
Patient arrived to ICU, placed on monitor and noted to be in SVT with HR 180s. Blood pressure noted to be 47/palp. Dr. Laural Benes and Dr. Sherene Sires made aware. NS bolus started. 2 mg Versed given. Patient placed on Zoll pads. Dr. Laural Benes to bedside and 6mg  of adenosine given at [phone]. Patient broke SVT and is now Sinus Tach in the 110s.  ?

## 2021-06-22 NOTE — Assessment & Plan Note (Addendum)
-   Was holding home dose metoprolol succinate secondary to hypotension but resumed  ?-Holding losartan ?-Continue to Monitor BP per Protocol  ?

## 2021-06-22 NOTE — Progress Notes (Signed)
Per verbal order from Dr Laural Benes: OK to use OG tube. ?

## 2021-06-22 NOTE — ED Notes (Signed)
Diprovan gtt decreased to . Patient breathing over vent 33 per minute. Skin warm and moist with sweat. OGT in place to [facility]. Purewick in place to [facility]. Patient moving hands and head occasionally.  ?

## 2021-06-22 NOTE — Progress Notes (Signed)
Dr Wynetta Emery updated on patient status and that patient husband [name]) is in patient room. Dr Wynetta Emery came to patient room and updated patient husband. Dr Wynetta Emery also aware of patient blood pressures and Levo ordered if and when needed.  ?

## 2021-06-22 NOTE — Progress Notes (Signed)
MEDICATION RELATED CONSULT NOTE - INITIAL  ? ?Pharmacy Consult for drug-drug interactions with amiodarone ? ?Allergies  ?Allergen Reactions  ? Bee Venom Other (See Comments)  ?  Unknown  ? Tdap [Tetanus-Diphth-Acell Pertussis] Other (See Comments)  ?  Unknown  ? ? ?Patient Measurements: ?Height: 5\' 1"  (154.9 cm) ?Weight: 100 kg (220 lb 7.4 oz) ?IBW/kg (Calculated) : 47.8 ?Adjusted Body Weight:  ? ?Vital Signs: ?Temp: 99.9 ?F (37.7 ?C) (03/21 1400) ?Temp Source: Bladder (03/21 1117) ?BP: 91/67 (03/21 1400) ?Pulse Rate: 96 (03/21 1430) ?Intake/Output from previous day: ?No intake/output data recorded. ?Intake/Output from this shift: ?Total I/O ?In: 715.1 [I.V.:365.1; IV Piggyback:350] ?Out: -  ? ?Labs: ?Recent Labs  ?  06/22/21 ?0302  ?WBC 24.4*  ?HGB 15.1*  ?HCT 47.3*  ?PLT 225  ?CREATININE 1.28*  ?MG 2.0  ?ALBUMIN 3.7  ?PROT 7.6  ?AST 40  ?ALT 32  ?ALKPHOS 167*  ?BILITOT 0.7  ? ?Estimated Creatinine Clearance: 50.7 mL/min (A) (by C-G formula based on SCr of 1.28 mg/dL (H)). ? ? ?Microbiology: ?Recent Results (from the past 720 hour(s))  ?Blood culture (routine x 2)     Status: None (Preliminary result)  ? Collection Time: 06/22/21  5:58 AM  ? Specimen: BLOOD  ?Result Value Ref Range Status  ? Specimen Description BLOOD FEMORAL ARTERY  Final  ? Special Requests   Final  ?  Blood Culture adequate volume BOTTLES DRAWN AEROBIC AND ANAEROBIC ?Performed at [facility], [address] [place], [place] [address] ?  ? Culture PENDING  Incomplete  ? Report Status PENDING  Incomplete  ?Resp Panel by RT-PCR (Flu A&B, Covid) Nasopharyngeal Swab     Status: None  ? Collection Time: 06/22/21  6:44 AM  ? Specimen: Nasopharyngeal Swab; Nasopharyngeal(NP) swabs in vial transport medium  ?Result Value Ref Range Status  ? SARS Coronavirus 2 by RT PCR NEGATIVE NEGATIVE Final  ?  Comment: (NOTE) ?SARS-CoV-2 target nucleic acids are NOT DETECTED. ? ?The SARS-CoV-2 RNA is generally detectable in upper respiratory ?specimens during the  acute phase of infection. The lowest ?concentration of SARS-CoV-2 viral copies this assay can detect is ?138 copies/mL. A negative result does not preclude SARS-Cov-2 ?infection and should not be used as the sole basis for treatment or ?other patient management decisions. A negative result may occur with  ?improper specimen collection/handling, submission of specimen other ?than nasopharyngeal swab, presence of viral mutation(s) within the ?areas targeted by this assay, and inadequate number of viral ?copies(<138 copies/mL). A negative result must be combined with ?clinical observations, patient history, and epidemiological ?information. The expected result is Negative. ? ?Fact Sheet for Patients:  ?EntrepreneurPulse.com.au ? ?Fact Sheet for Healthcare Providers:  ?IncredibleEmployment.be ? ?This test is no t yet approved or cleared by the [place] FDA and  ?has been authorized for detection and/or diagnosis of SARS-CoV-2 by ?FDA under an Emergency Use Authorization (EUA). This EUA will remain  ?in effect (meaning this test can be used) for the duration of the ?COVID-19 declaration under Section 564(b)(1) of the Act, 21 ?U.S.C.section 360bbb-3(b)(1), unless the authorization is terminated  ?or revoked sooner.  ? ? ?  ? Influenza A by PCR NEGATIVE NEGATIVE Final  ? Influenza B by PCR NEGATIVE NEGATIVE Final  ?  Comment: (NOTE) ?The Xpert Xpress SARS-CoV-2/FLU/RSV plus assay is intended as an aid ?in the diagnosis of influenza from Nasopharyngeal swab specimens and ?should not be used as a sole basis for treatment. Nasal washings and ?aspirates are unacceptable for Xpert Xpress  SARS-CoV-2/FLU/RSV ?testing. ? ?Fact Sheet for Patients: ?EntrepreneurPulse.com.au ? ?Fact Sheet for Healthcare Providers: ?IncredibleEmployment.be ? ?This test is not yet approved or cleared by the [place] FDA and ?has been authorized for detection and/or  diagnosis of SARS-CoV-2 by ?FDA under an Emergency Use Authorization (EUA). This EUA will remain ?in effect (meaning this test can be used) for the duration of the ?COVID-19 declaration under Section 564(b)(1) of the Act, 21 U.S.C. ?section 360bbb-3(b)(1), unless the authorization is terminated or ?revoked. ? ?Performed at [facility], [address], [place], [place] [address] ?  ?MRSA Next Gen by PCR, Nasal     Status: None  ? Collection Time: 06/22/21  8:50 AM  ? Specimen: Nasal Mucosa; Nasal Swab  ?Result Value Ref Range Status  ? MRSA by PCR Next Gen NOT DETECTED NOT DETECTED Final  ?  Comment: (NOTE) ?The GeneXpert MRSA Assay (FDA approved for NASAL specimens only), ?is one component of a comprehensive MRSA colonization surveillance ?program. It is not intended to diagnose MRSA infection nor to guide ?or monitor treatment for MRSA infections. ?Test performance is not FDA approved in patients less than 2 years ?old. ?Performed at [facility], [address], [place], [place] [address] ?  ? ? ?Medical History: ?Past Medical History:  ?Diagnosis Date  ? Anemia   ? Cervical disc disease   ? COPD (chronic obstructive pulmonary disease) ([place])   ? Diabetes mellitus without complication ([place])   ? Essential hypertension   ? GERD (gastroesophageal reflux disease)   ? Heart failure ([place]) 2018  ? History of cardiac catheterization   ? Normal coronaries March 2015  ? History of non-ST elevation myocardial infarction (NSTEMI)   ? Secondary to SVT  ? Lymphedema   ? Peripheral neuropathy   ? Spinal stenosis   ? SVT (supraventricular tachycardia) ([place])   ? ? ?Medications:  ?Medications Prior to Admission  ?Medication Sig Dispense Refill Last Dose  ? albuterol (PROVENTIL HFA;VENTOLIN HFA) 108 (90 Base) MCG/ACT inhaler Inhale 1 puff into the lungs every 6 (six) hours as needed for wheezing or shortness of breath.     ? albuterol (PROVENTIL) (2.5 MG/3ML) 0.083% nebulizer solution Take 2.5 mg by nebulization daily as needed.     ?  ANORO ELLIPTA 62.5-25 MCG/INH AEPB Inhale 1 puff into the lungs daily.   11   ? aspirin EC 81 MG EC tablet Take 1 tablet (81 mg total) by mouth daily.     ? atorvastatin (LIPITOR) 20 MG tablet Take 20 mg by mouth every morning.     ? co-enzyme Q-10 30 MG capsule Take 100 mg by mouth daily.     ? cyclobenzaprine (FLEXERIL) 5 MG tablet Take 1 tablet by mouth daily as needed for muscle pain.     ? EPINEPHrine (EPIPEN 2-PAK) 0.3 mg/0.3 mL IJ SOAJ injection Inject 0.3 mLs (0.3 mg total) into the muscle once as needed (for severe allergic reaction). CAll 911 immediately if you have to use this medicine 2 Device 1   ? fluticasone (FLONASE) 50 MCG/ACT nasal spray Place 2 sprays into both nostrils daily.     ? furosemide (LASIX) 20 MG tablet TAKE (1) TABLET BY MOUTH 2 TIMES DAILY AS NEEDED. (Patient taking differently: Take 20 mg by mouth daily.) 60 tablet 6   ? gabapentin (NEURONTIN) 100 MG capsule Take 2 capsules (200 mg total) by mouth 3 (three) times daily.     ? HYDROcodone-acetaminophen (NORCO) 10-325 MG tablet Take 1 tablet by mouth every  6 (six) hours as needed for severe pain. pain     ? isosorbide mononitrate (ISMO) 10 MG tablet Take 10 mg by mouth daily.     ? losartan (COZAAR) 25 MG tablet Take 25 mg by mouth daily.     ? metFORMIN (GLUCOPHAGE) 500 MG tablet Take 500 mg by mouth daily.  10   ? methocarbamol (ROBAXIN) 500 MG tablet Take 1 tablet (500 mg total) by mouth 2 (two) times daily. 20 tablet 0   ? metoprolol succinate (TOPROL-XL) 100 MG 24 hr tablet Take 1 tablet (100 mg total) by mouth daily. Take with or immediately following a meal. 90 tablet 3   ? nitroGLYCERIN (NITROSTAT) 0.4 MG SL tablet Place 1 tablet (0.4 mg total) under the tongue every 5 (five) minutes as needed for chest pain. 10 tablet 0   ? Oxycodone HCl 10 MG TABS Take 10 mg by mouth 4 (four) times daily as needed.     ? pantoprazole (PROTONIX) 40 MG tablet Take 40 mg by mouth every morning.     ? predniSONE (DELTASONE) 5 MG tablet Take 1  tablet (5 mg total) by mouth daily with breakfast. 100 tablet 1   ? primidone (MYSOLINE) 50 MG tablet Take 50 mg by mouth at bedtime.      ? ? ?Assessment: ?Amiodarone/Azithromycin increased risk of qt prolongatio

## 2021-06-22 NOTE — Sepsis Progress Note (Signed)
Following per sepsis protocol   

## 2021-06-22 NOTE — Progress Notes (Signed)
Bolus given per Dr Laural Benes due to patient was having soft b/p's. Patient titrated on sedation meds as tolerated but still has periods of jumping up and sitting up in bed and trying to pull at lines and vent. Unfortunately due to blood pressure running soft at times, unable to adequately sedate patient. Writer has attempted to verbally redirect and calm patient with little success. Dr Laural Benes made aware and soft wrist restraints ordered and applied. Patient has bruising and abrasions all over bilateral arms, wrists and hands prior to restraint application. Dr Laural Benes aware. Bilateral legs appear with scattered bruising and has some mottle-ing. Dr Laural Benes also aware.  Spoke with patient Husband, Alinda Money, in person who is also aware and educated of restraints. ?

## 2021-06-22 NOTE — Progress Notes (Signed)
Patient transported from ED to ICU on ventilator.  Patient tolerated well.  RT will continue to monitor.  ?

## 2021-06-22 NOTE — Progress Notes (Signed)
Pt transport to ct and back on vent with no complications. ?

## 2021-06-22 NOTE — Assessment & Plan Note (Signed)
--  head CT no acute findings ?--suspect this is metabolic encephalopathy ?--managing as noted  ?

## 2021-06-22 NOTE — Consult Note (Signed)
? ?Cardiology Consultation:  ? ?Patient ID: Gina Bradley; [id number]; [birth year]  ? ?Admit date: 06/22/2021 ?Date of Consult: 06/22/2021 ? ?Primary Care Provider: Redmond School, MD ?Primary Cardiologist: Rozann Lesches, MD ?Primary Electrophysiologist: Cristopher Peru, MD ? ? ?Patient Profile:  ? ?Gina Bradley is a 61 y.o. female with a history of PSVT, lymphedema, type 2 diabetes mellitus, hypertension, and COPD who is being seen today for the evaluation of recurrent SVT at the request of Dr. Wynetta Emery. ? ?History of Present Illness:  ? ?Gina Bradley is currently admitted to the hospital having presented via EMS with altered mental status and sustained SVT.  She also had evidence of hypoxic respiratory failure with associated leukocytosis and fever, increased lactate.  She ultimately required sedation and intubation with mechanical ventilation due to ongoing symptoms and agitation with plan for further CT imaging.  Her initial episode of SVT was successfully treated with adenosine 6 mg followed by 12 mg in the ER.  She had a recurrent event in the ICU that converted with adenosine 6 mg.  At home she is on Toprol-XL 100 mg daily. ? ?Chest CTA was negative for pulmonary embolus but did reveal infiltrates concerning for multifocal pneumonia with associated atelectasis, also multiple subacute and chronic rib fractures. ? ?Head CT showed chronic changes with no acute findings. ? ?I reviewed her most recent ECG which showed sinus tachycardia with right branch block and rightward axis. ? ?UDS positive for barbiturates, benzodiazepines, opiates, and THC. ? ?Past Medical History:  ?Diagnosis Date  ? Anemia   ? Cervical disc disease   ? COPD (chronic obstructive pulmonary disease) ([place])   ? Diabetes mellitus without complication ([place])   ? Essential hypertension   ? GERD (gastroesophageal reflux disease)   ? Heart failure ([place]) 2018  ? History of cardiac catheterization   ? Normal coronaries March 2015  ? History of non-ST  elevation myocardial infarction (NSTEMI)   ? Secondary to SVT  ? Lymphedema   ? Peripheral neuropathy   ? Spinal stenosis   ? SVT (supraventricular tachycardia) ([place])   ? ? ?Past Surgical History:  ?Procedure Laterality Date  ? CESAREAN SECTION    ? ESOPHAGOGASTRODUODENOSCOPY N/A 02/10/2014  ? NTI:[id number] tiny antral erosion; otherwise normal EGD. No explanation for patient's symptoms. Gallbladder needs further evaluation.  ? LEFT HEART CATHETERIZATION WITH CORONARY ANGIOGRAM N/A 06/03/2013  ? Procedure: LEFT HEART CATHETERIZATION WITH CORONARY ANGIOGRAM;  Surgeon: Sinclair Grooms, MD;  Location: [facility] CATH LAB;  Service: Cardiovascular;  Laterality: N/A;  ?  ? ?Inpatient Medications: ?Scheduled Meds: ? chlorhexidine gluconate (MEDLINE KIT)  15 mL Mouth Rinse BID  ? Chlorhexidine Gluconate Cloth  6 each Topical Q0600  ? docusate  100 mg Per Tube BID  ? enoxaparin (LOVENOX) injection  40 mg Subcutaneous Q24H  ? hydrocortisone sodium succinate  100 mg Intravenous Q12H  ? mouth rinse  15 mL Mouth Rinse 10 times per day  ? metoprolol tartrate  2.5 mg Intravenous Q6H  ? pantoprazole (PROTONIX) IV  40 mg Intravenous QHS  ? polyethylene glycol  17 g Per Tube Daily  ? ?Continuous Infusions: ? [START ON 06/23/2021] azithromycin    ? [START ON 06/23/2021] cefTRIAXone (ROCEPHIN)  IV    ? fentaNYL infusion INTRAVENOUS 100 mcg/hr (06/22/21 1353)  ? lactated ringers 70 mL/hr at 06/22/21 1353  ? midazolam 1 mg/hr (06/22/21 1353)  ? ?PRN Meds: ?acetaminophen, docusate, fentaNYL, ipratropium-albuterol, ondansetron (ZOFRAN) IV, polyethylene glycol ? ?Allergies:    ?Allergies  ?Allergen  Reactions  ? Bee Venom Other (See Comments)  ?  Unknown  ? Tdap [Tetanus-Diphth-Acell Pertussis] Other (See Comments)  ?  Unknown  ? ? ?Social History:   ?Social History  ? ?Tobacco Use  ? Smoking status: Former  ?  Packs/day: 1.00  ?  Years: 18.00  ?  Pack years: 18.00  ?  Types: Cigarettes  ?  Start date: 09/17/2003  ?  Quit date: 02/01/2019  ?  Years since  quitting: 2.3  ? Smokeless tobacco: Never  ?Substance Use Topics  ? Alcohol use: No  ?  Alcohol/week: 0.0 standard drinks  ?  ? ?Family History:   ?The patient's family history includes Angina in her mother; Colon cancer in an other family member; Diabetes in her father, maternal grandfather, and mother; Heart attack in her mother; Heart failure in her brother and maternal grandmother; Hypertension in her daughter, father, mother, and son; Stroke in her maternal grandmother; Suicidality in her sister. ? ?ROS:  ?Please see the history of present illness.  ?All other ROS reviewed and negative.    ? ?Physical Exam/Data:  ? ?Vitals:  ? 06/22/21 1311 06/22/21 1315 06/22/21 1330 06/22/21 1345  ?BP:  100/61 108/80 106/81  ?Pulse: 99 100 (!) 102 (!) 104  ?Resp: _0 ?Temp: 100.2 ?F (37.9 ?C) 100.2 ?F (37.9 ?C) 100 ?F (37.8 ?C) 100 ?F (37.8 ?C)  ?TempSrc:      ?SpO2: 91% 91% 94% 90%  ?Weight:      ?Height:      ? ? ?Intake/Output Summary (Last 24 hours) at 06/22/2021 1412 ?Last data filed at 06/22/2021 1353 ?Gross per 24 hour  ?Intake 715.08 ml  ?Output --  ?Net 715.08 ml  ? ?Filed Weights  ? 06/22/21 0256 06/22/21 1001  ?Weight: 100 kg 100 kg  ? ?Body mass index is 41.66 kg/m?.  ? ?Gen: Patient currently sedated and on the ventilator. ?HEENT: Conjunctiva and lids normal. ?Neck: Supple, difficult to assess JVP, no carotid bruits. ?Lungs: Scattered rhonchi without wheezing, nonlabored on ventilator. ?Cardiac: Regular rate and rhythm, no S3 or significant systolic murmur, no pericardial rub. ?Abdomen: Soft, nontender, bowel sounds present. ?Extremities: No pitting edema, distal pulses 2+. ?Skin: Warm and dry. ?Musculoskeletal: No kyphosis. ?Neuropsychiatric: Sedated. ? ?EKG:  An ECG dated 06/22/2021 was personally reviewed today and demonstrated:  Sinus tachycardia with rightward axis and right bundle branch block. ? ?Telemetry:  I personally reviewed telemetry which shows sinus rhythm.  Episode of PSVT also  reviewed. ? ?Relevant CV Studies: ? ?Echocardiogram 08/18/2020: ? 1. Left ventricular ejection fraction, by estimation, is 70 to 75%. The  ?left ventricle has hyperdynamic function. The left ventricle has no  ?regional wall motion abnormalities. Left ventricular diastolic parameters  ?are consistent with Grade I diastolic  ?dysfunction (impaired relaxation).  ? 2. Right ventricular systolic function is normal. The right ventricular  ?size is normal.  ? 3. The mitral valve is normal in structure. No evidence of mitral valve  ?regurgitation. No evidence of mitral stenosis.  ? 4. The aortic valve is normal in structure. Aortic valve regurgitation is  ?not visualized. No aortic stenosis is present.  ? 5. The inferior vena cava is normal in size with greater than 50%  ?respiratory variability, suggesting right atrial pressure of 3 mmHg.  ? ?Laboratory Data: ? ?Chemistry ?Recent Labs  ?Lab 06/22/21 ?0302  ?NA 134*  ?K 3.7  ?CL 94*  ?CO2 21*  ?GLUCOSE 212*  ?BUN 19  ?CREATININE 1.28*  ?  CALCIUM 8.6*  ?GFRNONAA 48*  ?ANIONGAP 19*  ?  ?Recent Labs  ?Lab 06/22/21 ?0302  ?PROT 7.6  ?ALBUMIN 3.7  ?AST 40  ?ALT 32  ?ALKPHOS 167*  ?BILITOT 0.7  ? ?Hematology ?Recent Labs  ?Lab 06/22/21 ?0302  ?WBC 24.4*  ?RBC 4.71  ?HGB 15.1*  ?HCT 47.3*  ?MCV 100.4*  ?MCH 32.1  ?MCHC 31.9  ?RDW 15.6*  ?PLT 225  ? ?Cardiac Enzymes ?Recent Labs  ?Lab 06/22/21 ?0352 06/22/21 ?0559  ?TROPONINIHS 94* 218*  ? ?Radiology/Studies:  ?CT HEAD WO CONTRAST (5MM) ? ?Result Date: 06/22/2021 ?CLINICAL DATA:  Altered mental status, unknown cause. EXAM: CT HEAD WITHOUT CONTRAST TECHNIQUE: Contiguous axial images were obtained from the base of the skull through the vertex without intravenous contrast. RADIATION DOSE REDUCTION: This exam was performed according to the departmental dose-optimization program which includes automated exposure control, adjustment of the mA and/or kV according to patient size and/or use of iterative reconstruction technique. COMPARISON:   Head CT 06/26/2019. FINDINGS: Factors affecting image quality: Nonstandard left posterior oblique head positioning increasing streak artifact in the posterior fossa. Brain: There is mild cerebral atrophy, small-vessel

## 2021-06-22 NOTE — Assessment & Plan Note (Addendum)
--  Pt was intubated in the ED due to severe agitation and hypoxia causing inability to get CT scan with high suspicion for PE ?--Secondary to pneumonia and now has an Aspiration PNA ?--3/23 Personally reviewed CXR--bibasilar opacities,+pulm edema ?--Appreciate PCCM ?-- Continue bronchodilators ?-CTA chest negative for PE, bilateral airspace opacities, subacute right and left rib fractures ?06/26/22>>CVP 20, lasix 40 mg IV bid started by PCCM but now stopped ?-Failed to Wean from the vent and is now s/p Tracheostomy 3/28 for failure to wean ?-Continue trach collar trials as long as she tolerates ?Continue chronic prednisone 10 mg once daily ?Continue Brovana, Pulmicort, Yupelri ?

## 2021-06-22 NOTE — Hospital Course (Addendum)
61 y/o female with CAD, smoker, heart failure, GERD, type 2 DM, hypertension, SVT, peripheral neuropathy, steroid dependent COPD, chronic pain history of DVT, chronic lymphedema, spinal stenosis, was recently diagnosed with compression deformity of the L4 vertebrae and subacute fracture of the right inferior pubic rami seen in the ED on 06/06/2021.  Patient was discharged and given orthopedic follow-up and had been prescribed hydrocodone and Robaxin.  She demonstrated that she was able to ambulate with no difficulty at that time and it was felt fracture had occurred likely 1 month prior to that ED visit.  She has been mostly immobile at home due to pain and discomfort and there was high suspicion for pulmonary embolus and venous thromboembolism. ? ?She presented to the ED by EMS today with altered mentation.  When she arrived she was in SVT with heart rate greater than 200.  The ED attempted cardioversion at 53 J with no success.  She was then treated with adenosine 6 mg followed by 12 mg and converted to sinus tachycardia.  Patient has been unable to give history.  She remained hypoxic and tachypneic with pulse ox of 80% on room air.  She was initially placed on a nonrebreather.   Patient also became severely agitated, remained hypoxic, and unable to receive necessary CT scanning therefore she was intubated due to high suspicion for PE in setting of immobility.   Central line was placed in the ED.  Intensivist was consulted in [place] and said that patient could remain at [facility], ICU. ? ?Patient was started on broad-spectrum antibiotic therapy via the sepsis protocols.  Initially elevated lactic acid at 6.0 improved to less than 1.5 after IV fluid hydration.  WBC was markedly elevated at 24. ? ?After arriving up to ICU bed she went back into SVT and given additional 6 mg of adenosine and went back into sinus tachycardia.  Cardiology evaluated and started on IV amiodarone infusion for persistent SVT  presentation.  BPs have been soft and may have to start on pressor support.   ? ?06/24/21:  received Lasix IV 40 mg at 0500, redosed IV lasix at 0930 for fluid overload; remains on vent with FiO2 up to 60%, PEEP 8, RR 24.  Tolerating enteral feeding.  Spike temp up to 101.1 ?PICC line placed--femoral line removed ? ?06/25/21:  remains on vent-Vt increased to 600 and RR to 34>>more synchronous.  fOx at 60%.  CVP 20 so lasix IV 40 bid and echo ordered.  Continues to have fever but off levophed since 10 PM 06/23/21. ? ?Further Significant Events: ? ?3/24 transfer to [facility] ?3/28 Trach for failure to wean ?3/31 Fever, hypotension, worsening leukocytosis. Started on levophed and antibiotics. ?4/2 PICC removed for line holiday; ID following ?4/4: off precedex;and fentanyl; ID plan to stop cefepime and vanc ?4/5: remains off precedex and fentanyl; failed PSV early in morning ?4/6: remains off continuous sedation; failed PSV this morning due to desaturation ?4/9: Tolerated being off the vent for about 8 hours yesterday 4/8 ? ?07/13/21: Transferred to [facility].  ?

## 2021-06-22 NOTE — ED Triage Notes (Signed)
Per husband pt is altered. Ems arrived pt was in svt, but had no iv access so they attempted cardioversion at 50 joules with no results.  ?

## 2021-06-22 NOTE — Progress Notes (Signed)
Late Entry..ETT tube pull back to 22 at lip per MD and xray. ?Vent changes made increased rate to 20 ?

## 2021-06-22 NOTE — Assessment & Plan Note (Addendum)
-  08/18/2020 hemoglobin A1c 7.3 ?-06/23/21 hemoglobin A1c--7.5 ?-Holding metformin ?-C/w SSI coverage and CBG monitoring  ?

## 2021-06-22 NOTE — Progress Notes (Signed)
RT attempted to collect a sputum sample from patient without success. RT left sputum trap inline in order to collect a future sample. ?

## 2021-06-22 NOTE — H&P (Signed)
?History and Physical  ?[place] ? ?Gina Bradley [id number] DOB: Jul 20, 1960 DOA: 06/22/2021 ? ?PCP: Redmond School, MD  ?Patient coming from: Home by [facility] ?Level of care: ICU ? ?I have personally briefly reviewed patient's old medical records in [place] ? ?Chief Complaint: AMS ? ?HPI: Gina Bradley is a 61 y/o female with CAD, smoker, heart failure, GERD, type 2 DM, hypertension, SVT, peripheral neuropathy, steroid dependent COPD, chronic pain history of DVT, chronic lymphedema, spinal stenosis, was recently diagnosed with compression deformity of the L4 vertebrae and subacute fracture of the right inferior pubic rami seen in the ED on 06/06/2021.  Patient was discharged and given orthopedic follow-up and had been prescribed hydrocodone and Robaxin.  She demonstrated that she was able to ambulate with no difficulty at that time and it was felt fracture had occurred likely 1 month prior to that ED visit.  She has been mostly immobile at home due to pain and discomfort and there was high suspicion for pulmonary embolus and venous thromboembolism. ? ?She presented to the ED by EMS today with altered mentation.  When she arrived she was in SVT with heart rate greater than 200.  The ED attempted cardioversion at 38 J with no success.  She was then treated with adenosine 6 mg followed by 12 mg and converted to sinus tachycardia.  Patient has been unable to give history.  She remained hypoxic and tachypneic with pulse ox of 80% on room air.  She was initially placed on a nonrebreather.  However, she started spiking fevers and code sepsis was called given her tachycardia fever and leukocytosis and elevated lactic acid.  Patient also became severely agitated and unable to receive necessary CT scanning therefore she was intubated due to high suspicion for PE in setting of immobility.  CT did not reveal a pulmonary embolus but noted multifocal pneumonia.  Central line was placed in the ED.  Intensivist  was consulted in [place] and said that patient could remain at [facility], ICU. ? ?Patient was started on broad-spectrum antibiotic therapy via the sepsis protocols.  Initially elevated lactic acid at 6.0 improved to less than 1.5 after IV fluid hydration.  WBC was markedly elevated at 24. ? ?After arriving up to ICU bed she went back into SVT and given additional 6 mg of adenosine and went back into sinus tachycardia.   ? ?Review of Systems: Review of Systems  ?Unable to perform ROS: Patient unresponsive  ?  ?Past Medical History:  ?Diagnosis Date  ? Anemia   ? Cervical disc disease   ? COPD (chronic obstructive pulmonary disease) ([place])   ? Diabetes mellitus without complication ([place])   ? Essential hypertension   ? GERD (gastroesophageal reflux disease)   ? Heart failure ([place]) 2018  ? History of cardiac catheterization   ? Normal coronaries March 2015  ? History of non-ST elevation myocardial infarction (NSTEMI)   ? Secondary to SVT  ? Lymphedema   ? Peripheral neuropathy   ? Spinal stenosis   ? SVT (supraventricular tachycardia) ([place])   ? ? ?Past Surgical History:  ?Procedure Laterality Date  ? CESAREAN SECTION    ? ESOPHAGOGASTRODUODENOSCOPY N/A 02/10/2014  ? [id number] tiny antral erosion; otherwise normal EGD. No explanation for patient's symptoms. Gallbladder needs further evaluation.  ? LEFT HEART CATHETERIZATION WITH CORONARY ANGIOGRAM N/A 06/03/2013  ? Procedure: LEFT HEART CATHETERIZATION WITH CORONARY ANGIOGRAM;  Surgeon: Sinclair Grooms, MD;  Location: [facility] CATH LAB;  Service:  Cardiovascular;  Laterality: N/A;  ? ? ? reports that she quit smoking about 2 years ago. Her smoking use included cigarettes. She started smoking about 17 years ago. She has a 18.00 pack-year smoking history. She has never used smokeless tobacco. She reports current drug use. Drug: Marijuana. She reports that she does not drink alcohol. ? ?Allergies  ?Allergen Reactions  ? Bee Venom Other (See Comments)  ?  Unknown  ? Tdap  [Tetanus-Diphth-Acell Pertussis] Other (See Comments)  ?  Unknown  ? ? ?Family History  ?Problem Relation Age of Onset  ? Heart attack Mother   ? Diabetes Mother   ? Hypertension Mother   ? Angina Mother   ? Diabetes Father   ? Hypertension Father   ? Heart failure Brother   ? Suicidality Sister   ? Colon cancer Other   ?     Possibly Dad   ? Stroke Maternal Grandmother   ? Heart failure Maternal Grandmother   ? Diabetes Maternal Grandfather   ? Hypertension Daughter   ? Hypertension Son   ? ? ?Prior to Admission medications   ?Medication Sig Start Date End Date Taking? Authorizing Provider  ?albuterol (PROVENTIL HFA;VENTOLIN HFA) 108 (90 Base) MCG/ACT inhaler Inhale 1 puff into the lungs every 6 (six) hours as needed for wheezing or shortness of breath.    [provider]  ?albuterol (PROVENTIL) (2.5 MG/3ML) 0.083% nebulizer solution Take 2.5 mg by nebulization daily as needed.    [provider]  ?Celedonio Miyamoto 62.5-25 MCG/INH AEPB Inhale 1 puff into the lungs daily.  09/09/16   [provider]  ?aspirin EC 81 MG EC tablet Take 1 tablet (81 mg total) by mouth daily. 06/03/13   Cherene Altes, MD  ?atorvastatin (LIPITOR) 20 MG tablet Take 20 mg by mouth every morning. 03/04/13   [provider]  ?co-enzyme Q-10 30 MG capsule Take 100 mg by mouth daily.    [provider]  ?cyclobenzaprine (FLEXERIL) 5 MG tablet Take 1 tablet by mouth daily as needed for muscle pain. 05/11/20   [provider]  ?EPINEPHrine (EPIPEN 2-PAK) 0.3 mg/0.3 mL IJ SOAJ injection Inject 0.3 mLs (0.3 mg total) into the muscle once as needed (for severe allergic reaction). CAll 911 immediately if you have to use this medicine 10/21/15   Noemi Chapel, MD  ?fluticasone [facility]) 50 MCG/ACT nasal spray Place 2 sprays into both nostrils daily.    [provider]  ?furosemide (LASIX) 20 MG tablet TAKE (1) TABLET BY MOUTH 2 TIMES DAILY AS NEEDED. ?Patient taking differently: Take 20 mg by  mouth daily. 08/23/19   Evans Lance, MD  ?gabapentin (NEURONTIN) 100 MG capsule Take 2 capsules (200 mg total) by mouth 3 (three) times daily. 07/04/19   Barton Dubois, MD  ?HYDROcodone-acetaminophen [facility]) 10-325 MG tablet Take 1 tablet by mouth every 6 (six) hours as needed for severe pain. pain 07/04/19   Barton Dubois, MD  ?isosorbide mononitrate (ISMO) 10 MG tablet Take 10 mg by mouth daily.    [provider]  ?losartan (COZAAR) 25 MG tablet Take 25 mg by mouth daily.    [provider]  ?metFORMIN (GLUCOPHAGE) 500 MG tablet Take 500 mg by mouth daily. 11/30/16   [provider]  ?methocarbamol (ROBAXIN) 500 MG tablet Take 1 tablet (500 mg total) by mouth 2 (two) times daily. 06/06/21   Suzy Bouchard, PA-C  ?metoprolol succinate (TOPROL-XL) 100 MG 24 hr tablet Take 1 tablet (100 mg  total) by mouth daily. Take with or immediately following a meal. 06/30/20 09/28/20  Imogene Burn, PA-C  ?nitroGLYCERIN (NITROSTAT) 0.4 MG SL tablet Place 1 tablet (0.4 mg total) under the tongue every 5 (five) minutes as needed for chest pain. 12/22/14   Tanna Furry, MD  ?Oxycodone HCl 10 MG TABS Take 10 mg by mouth 4 (four) times daily as needed. 09/10/19   [provider]  ?pantoprazole (PROTONIX) 40 MG tablet Take 40 mg by mouth every morning. 05/25/13   [provider]  ?predniSONE (DELTASONE) 5 MG tablet Take 1 tablet (5 mg total) by mouth daily with breakfast. 10/22/19   Tanda Rockers, MD  ?primidone (MYSOLINE) 50 MG tablet Take 50 mg by mouth at bedtime.  04/06/19   [provider]  ? ? ?Physical Exam: ?Vitals:  ? 06/22/21 0952 06/22/21 1000 06/22/21 1001 06/22/21 1007  ?BP: (!) 82/52 (!) 94/58  109/82  ?Pulse:  (!) 117  (!) 118  ?Resp: (!) 33 (!) 23  (!) 28  ?Temp:      ?TempSrc:      ?SpO2:  94%  92%  ?Weight:   100 kg   ?Height:   5\' 1"  (1.549 m)   ? ? ?Constitutional: critically ill appearing female, sedated on vent. Bruising all over extremities. ?Eyes: PERRL, lids  and conjunctivae normal ?ENMT: Mucous membranes are dry.   ?Neck: normal, supple, no masses, no thyromegaly ?Respiratory: no wheezing, no crackles. Intubated on vent.   ?Cardiovascular: tachycardic rate, nor

## 2021-06-22 NOTE — ED Provider Notes (Signed)
[facility]-EMERGENCY DEPT [facility] Emergency Department Provider Note MRN:  [id number]  Arrival date & time: 06/22/21     Chief Complaint   Altered Mental Status   History of Present Illness   Gina Bradley is a 61 y.o. year-old female with a history of hypertension, COPD, SVT presenting to the ED with chief complaint of altered mental status.  EMS called for altered mental status.  Found to be confused, heart rate greater than 200.  Review of Systems  I was unable to obtain a full/accurate HPI, PMH, or ROS due to the patient's altered mental status.  Patient's Health History    Past Medical History:  Diagnosis Date   Anemia    Cervical disc disease    COPD (chronic obstructive pulmonary disease) (HCC)    Diabetes mellitus without complication (HCC)    Essential hypertension    GERD (gastroesophageal reflux disease)    Heart failure (HCC) 2018   History of cardiac catheterization    Normal coronaries March 2015   History of non-ST elevation myocardial infarction (NSTEMI)    Secondary to SVT   Lymphedema    Peripheral neuropathy    Spinal stenosis    SVT (supraventricular tachycardia) (HCC)     Past Surgical History:  Procedure Laterality Date   CESAREAN SECTION     ESOPHAGOGASTRODUODENOSCOPY N/A 02/10/2014   [id number] tiny antral erosion; otherwise normal EGD. No explanation for patient's symptoms. Gallbladder needs further evaluation.   LEFT HEART CATHETERIZATION WITH CORONARY ANGIOGRAM N/A 06/03/2013   Procedure: LEFT HEART CATHETERIZATION WITH CORONARY ANGIOGRAM;  Surgeon: Lesleigh Noe, MD;  Location: [facility] CATH LAB;  Service: Cardiovascular;  Laterality: N/A;    Family History  Problem Relation Age of Onset   Heart attack Mother    Diabetes Mother    Hypertension Mother    Angina Mother    Diabetes Father    Hypertension Father    Heart failure Brother    Suicidality Sister    Colon cancer Other        Possibly Dad    Stroke Maternal Grandmother     Heart failure Maternal Grandmother    Diabetes Maternal Grandfather    Hypertension Daughter    Hypertension Son     Social History   Socioeconomic History   Marital status: Married    Spouse name: Not on file   Number of children: Not on file   Years of education: Not on file   Highest education level: Not on file  Occupational History   Occupation: [employer]    Employer: [employer]  Tobacco Use   Smoking status: Former    Packs/day: 1.00    Years: 18.00    Pack years: 18.00    Types: Cigarettes    Start date: 09/17/2003    Quit date: 02/01/2019    Years since quitting: 2.3   Smokeless tobacco: Never  Vaping Use   Vaping Use: Never used  Substance and Sexual Activity   Alcohol use: No    Alcohol/week: 0.0 standard drinks   Drug use: Yes    Types: Marijuana   Sexual activity: Not Currently    Birth control/protection: Post-menopausal  Other Topics Concern   Not on file  Social History Narrative   Not on file   Social Determinants of Health   Financial Resource Strain: Not on file  Food Insecurity: Not on file  Transportation Needs: Not on file  Physical Activity: Not on file  Stress: Not on file  Social Connections: Not on file  Intimate Partner Violence: Not on file     Physical Exam   Vitals:   06/22/21 0400 06/22/21 0500  BP: 124/70   Pulse:    Resp: (!) 25   Temp:    SpO2:  100%    CONSTITUTIONAL: Ill-appearing, in respiratory distress NEURO/PSYCH: Confused, oriented only to name, moves all extremities EYES:  eyes equal and reactive ENT/NECK:  no LAD, no JVD CARDIO: Tachycardic rate, poorly perfused PULM:  CTAB no wheezing or rhonchi GI/GU:  non-distended, non-tender MSK/SPINE:  No gross deformities, no edema SKIN: Diffusely mottled skin   *Additional and/or pertinent findings included in MDM below  Diagnostic and Interventional Summary    EKG Interpretation  Date/Time:  Tuesday June 22 2021 03:08:51 EDT Ventricular Rate:  142 PR  Interval:  142 QRS Duration: 70 QT Interval:  286 QTC Calculation: 440 R Axis:   100 Text Interpretation: Sinus tachycardia Consider right atrial enlargement Right axis deviation Low voltage, precordial leads Abnormal R-wave progression, early transition Abnormal T, consider ischemia, anterior leads ST elevation, consider inferior injury Confirmed by Kennis Carina [phone]) on 06/22/2021 3:37:56 AM       Labs Reviewed  CBC - Abnormal; Notable for the following components:      Result Value   WBC 24.4 (*)    Hemoglobin 15.1 (*)    HCT 47.3 (*)    MCV 100.4 (*)    RDW 15.6 (*)    nRBC 0.3 (*)    All other components within normal limits  COMPREHENSIVE METABOLIC PANEL - Abnormal; Notable for the following components:   Sodium 134 (*)    Chloride 94 (*)    CO2 21 (*)    Glucose, Bld 212 (*)    Creatinine, Ser 1.28 (*)    Calcium 8.6 (*)    Alkaline Phosphatase 167 (*)    GFR, Estimated 48 (*)    Anion gap 19 (*)    All other components within normal limits  LACTIC ACID, PLASMA - Abnormal; Notable for the following components:   Lactic Acid, Venous 6.0 (*)    All other components within normal limits  TROPONIN I (HIGH SENSITIVITY) - Abnormal; Notable for the following components:   Troponin I (High Sensitivity) 94 (*)    All other components within normal limits  CULTURE, BLOOD (ROUTINE X 2)  CULTURE, BLOOD (ROUTINE X 2)  RESP PANEL BY RT-PCR (FLU A&B, COVID) ARPGX2  MAGNESIUM  PROTIME-INR  BLOOD GAS, VENOUS  URINALYSIS, ROUTINE W REFLEX MICROSCOPIC  LACTIC ACID, PLASMA  BLOOD GAS, ARTERIAL  TROPONIN I (HIGH SENSITIVITY)    CT Angio Chest Pulmonary Embolism (PE) W or WO Contrast  Final Result    CT ABDOMEN PELVIS W CONTRAST  Final Result    DG Chest Port 1 View  Final Result    CT HEAD WO CONTRAST ( )  Final Result    DG Chest Port 1 View  Final Result      Medications  cefTRIAXone (ROCEPHIN) 2 g in sodium chloride 0.9 % 100 mL IVPB (has no administration in  time range)  propofol (DIPRIVAN) 1000 MG/100ML infusion (15 mcg/kg/min  100 kg Intravenous Rate/Dose Change 06/22/21 0605)  azithromycin (ZITHROMAX) 500 mg in sodium chloride 0.9 % 250 mL IVPB (has no administration in time range)  sodium chloride 0.9 % bolus 1,000 mL (0 mLs Intravenous Stopped 06/22/21 0403)  adenosine (ADENOCARD) 6 MG/2ML injection 6 mg (6 mg Intravenous Given 06/22/21 0302)  adenosine (ADENOCARD) 6  MG/2ML injection 12 mg (12 mg Intravenous Given 06/22/21 0305)  iohexol (OMNIPAQUE) 350 MG/ML injection 100 mL (100 mLs Intravenous Contrast Given 06/22/21 0400)  LORazepam (ATIVAN) injection 1 mg (1 mg Intravenous Given 06/22/21 0415)  LORazepam (ATIVAN) injection 1 mg (1 mg Intravenous Given 06/22/21 0415)  LORazepam (ATIVAN) injection 2 mg (2 mg Intravenous Given 06/22/21 0450)  etomidate (AMIDATE) injection 10 mg (10 mg Intravenous Given 06/22/21 0449)  rocuronium (ZEMURON) injection 100 mg (100 mg Intravenous Given 06/22/21 0449)  lactated ringers bolus 1,000 mL (1,000 mLs Intravenous New Bag/Given 06/22/21 9562)     Procedures  /  Critical Care .Critical Care Performed by: Sabas Sous, MD Authorized by: Sabas Sous, MD   Critical care provider statement:    Critical care time (minutes):  90   Critical care was necessary to treat or prevent imminent or life-threatening deterioration of the following conditions:  Respiratory failure   Critical care was time spent personally by me on the following activities:  Development of treatment plan with patient or surrogate, discussions with consultants, evaluation of patient's response to treatment, examination of patient, ordering and review of laboratory studies, ordering and review of radiographic studies, ordering and performing treatments and interventions, pulse oximetry, re-evaluation of patient's condition and review of old charts Ultrasound ED Peripheral IV (Provider)  Date/Time: 06/22/2021 3:39 AM Performed by: Sabas Sous, MD Authorized by: Sabas Sous, MD   Procedure details:    Indications: multiple failed IV attempts     Skin Prep: chlorhexidine gluconate     Location:  Right AC   Angiocath:  18 G   Bedside Ultrasound Guided: Yes     Patient tolerated procedure without complications: Yes     Dressing applied: Yes   .Cardioversion  Date/Time: 06/22/2021 3:39 AM Performed by: Sabas Sous, MD Authorized by: Sabas Sous, MD   Consent:    Consent obtained:  Emergent situation Pre-procedure details:    Cardioversion basis:  Emergent   Rhythm:  Supraventricular tachycardia   Electrode placement:  Anterior-posterior Patient sedated: No Attempt one:    Cardioversion mode attempt one: 6mg  adenosine.   Shock outcome:  No change in rhythm Attempt two:    Cardioversion mode attempt two: 12mg  adenosine.   Shock outcome:  Conversion to normal sinus rhythm Procedure Name: Intubation Date/Time: 06/22/2021 4:56 AM Performed by: Sabas Sous, MD Pre-anesthesia Checklist: Patient identified, Patient being monitored, Emergency Drugs available, Timeout performed and Suction available Oxygen Delivery Method: Non-rebreather mask Preoxygenation: Pre-oxygenation with 100% oxygen Induction Type: Rapid sequence Ventilation: Mask ventilation without difficulty Laryngoscope Size: Glidescope and 3 Grade View: Grade I Tube size: 7.5 mm Number of attempts: 1 Airway Equipment and Method: Rigid stylet Placement Confirmation: ETT inserted through vocal cords under direct vision, CO2 detector and Breath sounds checked- equal and bilateral Secured at: 25 cm Tube secured with: ETT holder Comments: RSI intubation for airway protection in the setting of hypoxic respiratory failure, persistent agitation and inability to safely obtain CT imaging.  Pretreatment with Ativan for agitation followed by 10 mg etomidate, 100 mg rocuronium.  No complications.  ET tube appeared a bit low on postintubation chest  x-ray, retracted back 3 cm.    Marthenia Rolling Line  Date/Time: 06/22/2021 6:04 AM Performed by: Sabas Sous, MD Authorized by: Sabas Sous, MD   Consent:    Consent obtained:  Emergent situation Universal protocol:    Immediately prior to procedure, a time out was called: yes  Patient identity confirmed:  Anonymous protocol, patient vented/unresponsive Pre-procedure details:    Indication(s): central venous access     Hand hygiene: Hand hygiene performed prior to insertion     Sterile barrier technique: All elements of maximal sterile technique followed     Skin preparation:  Chlorhexidine   Skin preparation agent: Skin preparation agent completely dried prior to procedure   Sedation:    Sedation type:  None Anesthesia:    Anesthesia method:  None Procedure details:    Location:  R femoral   Patient position:  Reverse Trendelenburg   Procedural supplies:  Triple lumen   Catheter size:  7 Fr   Landmarks identified: yes     Ultrasound guidance: yes     Ultrasound guidance timing: real time     Sterile ultrasound techniques: Sterile gel and sterile probe covers were used     Number of attempts:  1   Successful placement: yes   Post-procedure details:    Post-procedure:  Dressing applied and line sutured   Assessment:  Blood return through all ports and free fluid flow   Procedure completion:  Tolerated well, no immediate complications  ED Course and Medical Decision Making  Initial Impression and Ddx Patient presenting in acute respiratory distress in the setting of supraventricular tachycardia with a heart rate of 212.  She appears mottled and poorly perfused, she is a bit altered.  IV access quickly obtained and cardioversion performed with adenosine, successful on second attempt with 12 mg.  EKG now demonstrating sinus tachycardia with rates in the 130s to 140s.  Patient was hypoxic during the episode of SVT and remains hypoxic and tachypneic, down to 80% on room air.   She is on a nonrebreather, she is feeling much more comfortable.  She is still altered, does not know what year it is.  Recent fall and pelvic fractures, immobility recently may have contributed to possible pulmonary embolism, which is the main concern on the differential diagnosis at this time.  She is complaining of chest pain at this time.  ACS also considered.  Awaiting labs, CT imaging.  Past medical/surgical history that increases complexity of ED encounter: COPD, SVT  Interpretation of Diagnostics I personally reviewed the EKG and my interpretation is as follows: Supraventricular tachycardia    Labs reveal prominent leukocytosis, mild creatinine elevation.  Troponin elevated above 90.  Significant lactic acidosis greater than 6  Patient Reassessment and Ultimate Disposition/Management Patient spiked fever and so sepsis also a concern especially given the tachycardia, elevated lactate, leukocytosis.  Code sepsis initiated, started on empiric antibiotics.  Patient remained agitated and her mental status would not allow for necessary CT imaging and so intubation was performed as described above.  CT imaging does not reveal any PE but does reveal multifocal pneumonia.  We will touch base with intensivist to see if patient needs to be transferred to, ICU versus remain here at [facility], ICU.  Central line also obtained.  Patient management required discussion with the following services or consulting groups:  Hospitalist Service and Intensivist Service  Complexity of Problems Addressed Acute illness or injury that poses threat of life of bodily function  Additional Data Reviewed and Analyzed Further history obtained from: EMS on arrival  Additional Factors Impacting ED Encounter Risk Use of parenteral controlled substances, Consideration of hospitalization, and Major procedures  Elmer Sow. Pilar Plate, MD [facility] Emergency Medicine [facility] [email or web address]  Final  Clinical Impressions(s) / ED Diagnoses  ICD-10-CM   1. Sepsis, due to unspecified organism, unspecified whether acute organ dysfunction present (HCC)  A41.9     2. Multifocal pneumonia  J18.9       ED Discharge Orders     None        Discharge Instructions Discussed with and Provided to Patient:   Discharge Instructions   None      Sabas Sous, MD 06/22/21 [phone]

## 2021-06-22 NOTE — Assessment & Plan Note (Signed)
--  Pt will need follow up with gynecology after discharge for an endometrial biopsy ?

## 2021-06-22 NOTE — Assessment & Plan Note (Signed)
--  per records, patient still smoking ?--followed by Dr. Sherene Sires in pulmonary clinic ?

## 2021-06-22 NOTE — Assessment & Plan Note (Addendum)
Continue pantoprazole. °

## 2021-06-22 NOTE — Sepsis Progress Note (Signed)
Notified bedside nurse of need to document delay and/or infusions of antibiotic via secure chat. Pt has had multiple events occur such as intubation, ct scan, X-rays, cardioversion per MD notes that contribute to the delay. ?

## 2021-06-22 NOTE — Assessment & Plan Note (Addendum)
--  she was seen in ED on 3/5 and outpatient orthopedic follow up ?--pain management, ambulation, PT eval when medically ready ?-CT abdomen--subacute right sacral insufficiency fractures, healing right obturator ring fracture, healing L3-5 transverse process fracture, nondisplaced left pubic body fracture ?

## 2021-06-22 NOTE — Progress Notes (Signed)
61 year female with COPD. She has multi focal pneumonia causing sepsis and resp failure. She had SVT on arrival. She had adenosine, and improved. EMS was initially called for altered mental status. While I?m the ED, patient developed fever. She was very agitated, and couldn?t hold still for workup or medical treatments in the Ed, so she was intubated. Critical care advises that larding is safe to stay here.

## 2021-06-22 NOTE — Assessment & Plan Note (Addendum)
Acute on Chronic Heart Failure with Preserved EF ?HTN  ?HLD ?--Cardioversion unsuccessful in ED ?--Treated with adenosine with conversion to sinus tachycardia ?--Pt has longstanding history of this and has been maintained on metoprolol  ?--Cardiology had been consulted ?-No longer on Amiodarone but Continue metoprolol, spironolactone, aspirin and statin ?

## 2021-06-22 NOTE — Assessment & Plan Note (Addendum)
Aspiration PNA ?Metapneumovirus Infection  ?-Secondary to pneumoia ?-Presented with fever, elevated lactate, and leukocytosis ?-Continue IV fluids>>saline lock as pt developed fluid overload ?-Was on Levophed 2 mcg/kg/min>>titrated off ?-Wean off Levophed as tolerated ?-Continue Solu-Cortef>>start wean ?-continued to spike Temps ?-repeat blood culture 3/23 neg ?-repeat UA no pyuria ?-WBC elevated and trended up to 22.3 and is now 19.5 ?-Resume Vanocmycin given most Recent Sputum Cx ?

## 2021-06-22 NOTE — Assessment & Plan Note (Addendum)
-   Given a dose of IV Lasix in the emergency department ?-Appears clinically euvolemic at present ?-08/18/2020 echo EF 70-75%, G1DD, EF 70-75% ?-lasix IV 40 mg given 06/24/21 x 2 ?-06/25/21--lasix IV ?-Echo done ?-C/w Spironlactone ? ?

## 2021-06-22 NOTE — Consult Note (Signed)
? ?NAME:  Gina Bradley, MRN:  [id number], DOB:  [birth year], LOS: 0 ?ADMISSION DATE:  06/22/2021, CONSULTATION DATE:  3/21 ?REFERRING MD:  Wynetta Emery, [facility], CHIEF COMPLAINT:  acute resp failure/ fever wth AMS and SVT ? ?History of Present Illness:  ?61 yowf quit smoking x 2 y PTA presenting 3 am 3/21 to [facility] ER with AMS and fever/agitation so ET  by EDP and w/u c/w multifocal pna and PCCM service consulted.  ? ?From triad intake as no direct hx available 61 y/o female with CAD, smoker, heart failure, GERD, type 2 DM, hypertension, SVT, peripheral neuropathy, steroid dependent COPD, chronic pain history of DVT, chronic lymphedema, spinal stenosis, was recently diagnosed with compression deformity of the L4 vertebrae and subacute fracture of the right inferior pubic rami seen in the ED on 06/06/2021.  Patient was discharged and given orthopedic follow-up and had been prescribed hydrocodone and Robaxin.  She demonstrated that she was able to ambulate with no difficulty at that time and it was felt fracture had occurred likely 1 month prior to that ED visit.  She has been mostly immobile at home due to pain and discomfort and there was high suspicion for pulmonary embolus and venous thromboembolism. ?  ?She presented to the ED by EMS   with altered mentation.  When she arrived she was in SVT with heart rate greater than 200.  The ED attempted cardioversion at 39 J with no success.  She was then treated with adenosine 6 mg followed by 12 mg and converted to sinus tachycardia.  Patient has been unable to give history.  She remained hypoxic and tachypneic with pulse ox of 80% on room air.  She was initially placed on a nonrebreather.  However, she started spiking fevers and code sepsis was called given her tachycardia fever and leukocytosis and elevated lactic acid.  Patient also became severely agitated and unable to receive necessary CT scanning therefore she was intubated due to high suspicion for PE in setting of  immobility.  CT did not reveal a pulmonary embolus but noted multifocal pneumonia.  Central line was placed in the ED.  Intensivist was consulted in [place] and said that patient could remain at [facility], ICU. ?  ?Patient was started on broad-spectrum antibiotic therapy via the sepsis protocols.  Initially elevated lactic acid at 6.0 improved to less than 1.5 after IV fluid hydration.  WBC was markedly elevated at 24. ?  ?After arriving up to ICU bed she went back into SVT and given additional 6 mg of adenosine and went back into sinus tachycardia.   ?  ? ? ? ?Pertinent  Medical History  ?Anemia ? Cervical disc disease ? COPD  ? Diabetes mellitus without complication  ? Essential hypertension ?GERD  ? Heart failure  ?History of cardiac catheterization ?History of non-ST elevation myocardial infarction  ?Lymphedema ?Peripheral neuropathy ?Spinal stenosis  ?SVT  ? ? ? ?Significant Hospital Events: ?Including procedures, antibiotic start and stop dates in addition to other pertinent events   ?ET 3/21 ?R Fem cvl 3/21  ?Amiodarone drip 3/21  ? ?Interim History / Subjective:  ?Agitated and pulling at lines on fent drip/ bp better off of propofol / psvt rx amiodarone ? ?Objective   ?Blood pressure (!) 96/54, pulse 89, temperature 99.3 ?F (37.4 ?C), resp. rate 19, height 5\' 1"  (1.549 m), weight 100 kg, last menstrual period 01/05/2015, SpO2 93 %. ?   ?Vent Mode: PRVC ?FiO2 (%):  [60 %] 60 % ?Set Rate:  [  16 bmp-20 bmp] 20 bmp ?Vt Set:  [400 mL] 400 mL ?PEEP:  [5 cmH20] 5 cmH20 ?Plateau Pressure:  [9 [id number] cmH20] 15 cmH20  ? ?Intake/Output Summary (Last 24 hours) at 06/22/2021 1708 ?Last data filed at 06/22/2021 1650 ?Gross per 24 hour  ?Intake 1984.55 ml  ?Output --  ?Net 1984.55 ml  ? ?Filed Weights  ? 06/22/21 0256 06/22/21 1001  ?Weight: 100 kg 100 kg  ? ? ?Examination: ?Tmax   103  ?General appearance:    elderly obese wf sedated on vent   ?  ?No jvd ?Oropharynx oral et  ?Neck supple ?Lungs with a few scattered exp  > insp rhonchi bilaterally - SR on monitor  ?RRR no s3 or or sign murmur ?Abd obese with limited excursion  ?Extr warm with no edema or clubbing noted but extensive purlplish rash ? Mottled LE's ?Neuro  Sensorium sedated ,  no apparent motor deficits  ? ? ?I personally reviewed images and agree with radiology impression as follows:  ?CXR:   portable 3/21 p et repositioned  ?Endotracheal tube now 4 cm above the carina. Other support apparatus ?stable. Heart is enlarged with similar mid and lower lung airspace ?opacities versus edema. ? ? ?  ? ?Assessment & Plan:  ?1)  Acute hypoxemic/ hypercarbic resp failure in pt with  copd and probably sepsis ? Etiology  ?>>> vent settings reviewed/ approp  ? ?3) fever/ leukocytosis with multiple air space dz c/w CAP ?- RVP 3/21  neg covid/ flu ?- urine for legionellla 3/21>>> ?- urine for stre 3/21 >>> ?- BC x 2 3/21  ?- ET gm stain and culture  3/21 >>> ?- Urine culture  3/21 >>> ?- MRSA PCR  3/21  neg  ?- PCT  3/21 >>> ?>>> zmax/Ctx 3/21 >>> ?Approp rx for CAP unless MRSA screen positive ? ?2) Pos drug screen for barbiturates, THC. Benzo / opiates (latter two may have been from ER meds)  ? ?3) PSVT > amiodarone rx 3/21  ? ?Best Practice (right click and "Reselect all SmartList Selections" daily)  ? ?Per triad  ? ?Labs   ?CBC: ?Recent Labs  ?Lab 06/22/21 ?0302  ?WBC 24.4*  ?HGB 15.1*  ?HCT 47.3*  ?MCV 100.4*  ?PLT 225  ? ? ?Basic Metabolic Panel: ?Recent Labs  ?Lab 06/22/21 ?0302  ?NA 134*  ?K 3.7  ?CL 94*  ?CO2 21*  ?GLUCOSE 212*  ?BUN 19  ?CREATININE 1.28*  ?CALCIUM 8.6*  ?MG 2.0  ? ?GFR: ?Estimated Creatinine Clearance: 50.7 mL/min (A) (by C-G formula based on SCr of 1.28 mg/dL (H)). ?Recent Labs  ?Lab 06/22/21 ?0302 06/22/21 ?0559  ?WBC 24.4*  --   ?LATICACIDVEN 6.0* 1.4  ? ? ?Liver Function Tests: ?Recent Labs  ?Lab 06/22/21 ?0302  ?AST 40  ?ALT 32  ?ALKPHOS 167*  ?BILITOT 0.7  ?PROT 7.6  ?ALBUMIN 3.7  ? ?No results for input(s): LIPASE, AMYLASE in the last 168 hours. ?No  results for input(s): AMMONIA in the last 168 hours. ? ?ABG ?   ?Component Value Date/Time  ? PHART 7.26 (L) 06/22/2021 [id number]  ? PCO2ART 65 (H) 06/22/2021 [id number]  ? PO2ART 82 (L) 06/22/2021 [id number]  ? HCO3 27.9 06/22/2021 0605  ? ACIDBASEDEF 0.8 06/22/2021 0352  ? O2SAT 92.8 06/22/2021 0605  ?  ? ?Coagulation Profile: ?Recent Labs  ?Lab 06/22/21 ?0302  ?INR 1.0  ? ? ?Cardiac Enzymes: ?No results for input(s): CKTOTAL, CKMB, CKMBINDEX, TROPONINI in the last 168 hours. ? ?HbA1C: ?Hgb A1c  MFr Bld  ?Date/Time Value Ref Range Status  ?08/18/2020 01:34 AM 7.3 (H) 4.8 - 5.6 % Final  ?  Comment:  ?  (NOTE) ?Pre diabetes:          5.7%-6.4% ? ?Diabetes:              >6.4% ? ?Glycemic control for   <7.0% ?adults with diabetes ?  ?11/01/2016 06:40 PM 6.0 (H) 4.8 - 5.6 % Final  ?  Comment:  ?  (NOTE) ?        Pre-diabetes: 5.7 - 6.4 ?        Diabetes: >6.4 ?        Glycemic control for adults with diabetes: <7.0 ?  ? ? ?CBG: ?Recent Labs  ?Lab 06/22/21 ?1115 06/22/21 ?1638  ?GLUCAP 120* 226*  ? ?  ? ?Past Medical History:  ?She,  has a past medical history of Anemia, Cervical disc disease, COPD (chronic obstructive pulmonary disease) ([place]), Diabetes mellitus without complication ([place]), Essential hypertension, GERD (gastroesophageal reflux disease), Heart failure ([place]) (2018), History of cardiac catheterization, History of non-ST elevation myocardial infarction (NSTEMI), Lymphedema, Peripheral neuropathy, Spinal stenosis, and SVT (supraventricular tachycardia) ([place]).  ? ?Surgical History:  ? ?Past Surgical History:  ?Procedure Laterality Date  ? CESAREAN SECTION    ? ESOPHAGOGASTRODUODENOSCOPY N/A 02/10/2014  ? [id number] tiny antral erosion; otherwise normal EGD. No explanation for patient's symptoms. Gallbladder needs further evaluation.  ? LEFT HEART CATHETERIZATION WITH CORONARY ANGIOGRAM N/A 06/03/2013  ? Procedure: LEFT HEART CATHETERIZATION WITH CORONARY ANGIOGRAM;  Surgeon: Sinclair Grooms, MD;  Location: [facility] CATH LAB;  Service:  Cardiovascular;  Laterality: N/A;  ?  ? ?Social History:  ? reports that she quit smoking about 2 years ago. Her smoking use included cigarettes. She started smoking about 17 years ago. She has a 18.00 pack-

## 2021-06-23 ENCOUNTER — Inpatient Hospital Stay (HOSPITAL_COMMUNITY): Payer: 59

## 2021-06-23 ENCOUNTER — Inpatient Hospital Stay: Payer: Self-pay

## 2021-06-23 DIAGNOSIS — I5032 Chronic diastolic (congestive) heart failure: Secondary | ICD-10-CM

## 2021-06-23 DIAGNOSIS — J9601 Acute respiratory failure with hypoxia: Secondary | ICD-10-CM | POA: Diagnosis not present

## 2021-06-23 DIAGNOSIS — J9602 Acute respiratory failure with hypercapnia: Secondary | ICD-10-CM

## 2021-06-23 DIAGNOSIS — R7401 Elevation of levels of liver transaminase levels: Secondary | ICD-10-CM

## 2021-06-23 DIAGNOSIS — G9341 Metabolic encephalopathy: Secondary | ICD-10-CM

## 2021-06-23 DIAGNOSIS — J189 Pneumonia, unspecified organism: Secondary | ICD-10-CM

## 2021-06-23 DIAGNOSIS — F1120 Opioid dependence, uncomplicated: Secondary | ICD-10-CM

## 2021-06-23 DIAGNOSIS — I10 Essential (primary) hypertension: Secondary | ICD-10-CM

## 2021-06-23 DIAGNOSIS — R6520 Severe sepsis without septic shock: Secondary | ICD-10-CM

## 2021-06-23 DIAGNOSIS — R9389 Abnormal findings on diagnostic imaging of other specified body structures: Secondary | ICD-10-CM

## 2021-06-23 DIAGNOSIS — J181 Lobar pneumonia, unspecified organism: Secondary | ICD-10-CM

## 2021-06-23 DIAGNOSIS — I471 Supraventricular tachycardia: Secondary | ICD-10-CM | POA: Diagnosis not present

## 2021-06-23 DIAGNOSIS — A419 Sepsis, unspecified organism: Secondary | ICD-10-CM | POA: Diagnosis not present

## 2021-06-23 LAB — GLUCOSE, CAPILLARY
Glucose-Capillary: 145 mg/dL — ABNORMAL HIGH (ref 70–99)
Glucose-Capillary: 170 mg/dL — ABNORMAL HIGH (ref 70–99)
Glucose-Capillary: 199 mg/dL — ABNORMAL HIGH (ref 70–99)
Glucose-Capillary: 202 mg/dL — ABNORMAL HIGH (ref 70–99)
Glucose-Capillary: 223 mg/dL — ABNORMAL HIGH (ref 70–99)
Glucose-Capillary: 243 mg/dL — ABNORMAL HIGH (ref 70–99)

## 2021-06-23 LAB — COMPREHENSIVE METABOLIC PANEL
ALT: 95 U/L — ABNORMAL HIGH (ref 0–44)
AST: 117 U/L — ABNORMAL HIGH (ref 15–41)
Albumin: 2.3 g/dL — ABNORMAL LOW (ref 3.5–5.0)
Alkaline Phosphatase: 96 U/L (ref 38–126)
Anion gap: 10 (ref 5–15)
BUN: 20 mg/dL (ref 6–20)
CO2: 22 mmol/L (ref 22–32)
Calcium: 7.8 mg/dL — ABNORMAL LOW (ref 8.9–10.3)
Chloride: 106 mmol/L (ref 98–111)
Creatinine, Ser: 0.76 mg/dL (ref 0.44–1.00)
GFR, Estimated: >60 mL/min (ref >60)
Glucose, Bld: 187 mg/dL — ABNORMAL HIGH (ref 70–99)
Potassium: 3.4 mmol/L — ABNORMAL LOW (ref 3.5–5.1)
Sodium: 138 mmol/L (ref 135–145)
Total Bilirubin: 0.4 mg/dL (ref 0.3–1.2)
Total Protein: 5.1 g/dL — ABNORMAL LOW (ref 6.5–8.1)

## 2021-06-23 LAB — CBC
HCT: 33.9 % — ABNORMAL LOW (ref 36.0–46.0)
Hemoglobin: 11 g/dL — ABNORMAL LOW (ref 12.0–15.0)
MCH: 32 pg (ref 26.0–34.0)
MCHC: 32.4 g/dL (ref 30.0–36.0)
MCV: 98.5 fL (ref 80.0–100.0)
Platelets: 165 10*3/uL (ref 150–400)
RBC: 3.44 MIL/uL — ABNORMAL LOW (ref 3.87–5.11)
RDW: 15.8 % — ABNORMAL HIGH (ref 11.5–15.5)
WBC: 12.6 10*3/uL — ABNORMAL HIGH (ref 4.0–10.5)
nRBC: 0 % (ref 0.0–0.2)

## 2021-06-23 LAB — LEGIONELLA PNEUMOPHILA SEROGP 1 UR AG: L. pneumophila Serogp 1 Ur Ag: NEGATIVE

## 2021-06-23 LAB — PROCALCITONIN: Procalcitonin: 9.28 ng/mL

## 2021-06-23 LAB — MAGNESIUM: Magnesium: 1.9 mg/dL (ref 1.7–2.4)

## 2021-06-23 LAB — BRAIN NATRIURETIC PEPTIDE: B Natriuretic Peptide: 117 pg/mL — ABNORMAL HIGH (ref 0.0–100.0)

## 2021-06-23 LAB — URINE CULTURE: Culture: NO GROWTH

## 2021-06-23 LAB — HEMOGLOBIN A1C
Hgb A1c MFr Bld: 7.5 % — ABNORMAL HIGH (ref 4.8–5.6)
Mean Plasma Glucose: 168.55 mg/dL

## 2021-06-23 MED ORDER — CLONAZEPAM 0.5 MG PO TABS
2.0000 mg | ORAL_TABLET | Freq: Four times a day (QID) | ORAL | Status: DC
  Filled 2021-06-23 (×2): qty 1
  Filled 2021-06-23: qty 4
  Filled 2021-06-23 (×7): qty 1

## 2021-06-23 MED ORDER — VITAL 1.5 CAL PO LIQD
1000.0000 mL | ORAL | Status: DC
  Administered 2021-06-23 – 2021-07-19 (×23): 1000 mL
  Filled 2021-06-23 (×19): qty 1000

## 2021-06-23 MED ORDER — POTASSIUM CHLORIDE 10 MEQ/100ML IV SOLN
10.0000 meq | INTRAVENOUS | Status: AC
  Administered 2021-06-23 (×2): 10 meq via INTRAVENOUS
  Filled 2021-06-23 (×2): qty 100

## 2021-06-23 MED ORDER — VITAL 1.0 CAL PO LIQD
1000.0000 mL | ORAL | Status: DC
Start: 2021-06-23 — End: 2021-06-23

## 2021-06-23 MED ORDER — CLONAZEPAM 1 MG PO TABS
2.0000 mg | ORAL_TABLET | Freq: Four times a day (QID) | ORAL | Status: DC
  Administered 2021-06-23 – 2021-06-25 (×10): 2 mg
  Filled 2021-06-23: qty 4
  Filled 2021-06-23: qty 2
  Filled 2021-06-23 (×6): qty 4
  Filled 2021-06-23: qty 2
  Filled 2021-06-23: qty 4

## 2021-06-23 MED ORDER — LACTATED RINGERS IV BOLUS
1000.0000 mL | Freq: Once | INTRAVENOUS | Status: AC
Start: 2021-06-23 — End: 2021-06-23
  Administered 2021-06-23: 1000 mL via INTRAVENOUS

## 2021-06-23 MED ORDER — INSULIN ASPART 100 UNIT/ML IJ SOLN
0.0000 [IU] | INTRAMUSCULAR | Status: DC
  Administered 2021-06-23: 3 [IU] via SUBCUTANEOUS
  Administered 2021-06-23 (×2): 1 [IU] via SUBCUTANEOUS
  Administered 2021-06-23 (×2): 3 [IU] via SUBCUTANEOUS
  Administered 2021-06-24 (×2): 5 [IU] via SUBCUTANEOUS
  Administered 2021-06-24: 7 [IU] via SUBCUTANEOUS
  Administered 2021-06-24: 2 [IU] via SUBCUTANEOUS
  Administered 2021-06-24: 3 [IU] via SUBCUTANEOUS
  Administered 2021-06-25: 5 [IU] via SUBCUTANEOUS
  Administered 2021-06-25: 3 [IU] via SUBCUTANEOUS
  Administered 2021-06-25: 1 [IU] via SUBCUTANEOUS
  Administered 2021-06-25: 2 [IU] via SUBCUTANEOUS
  Administered 2021-06-25: 3 [IU] via SUBCUTANEOUS
  Administered 2021-06-25 (×2): 5 [IU] via SUBCUTANEOUS
  Administered 2021-06-26 (×2): 2 [IU] via SUBCUTANEOUS

## 2021-06-23 NOTE — Assessment & Plan Note (Addendum)
-  Complicates overall prognosis and care ?-Estimated body mass index is 36.8 kg/m? as calculated from the following: ?  Height as of this encounter: 5\' 5"  (1.651 m). ?  Weight as of this encounter: 100.3 kg.  ?-Weight Loss and Dietary Counseling given ? ?

## 2021-06-23 NOTE — Progress Notes (Addendum)
Initial Nutrition Assessment ? ?DOCUMENTATION CODES:  ? ?Morbid obesity ? ?INTERVENTION:  ? ?If pt remains intubated >24-48 hr and is hemodynamically stable: ? ?Recommend start- Vital HP 1.0 @ 50 ml/hr via OGT- (1200 ml)  ? ?Provides: 1200 kcal, 104 gr protein and 1003 ml  ? ?NUTRITION DIAGNOSIS:  ? ?Inadequate oral intake related to acute illness (pneumonia) as evidenced by NPO status (requiring vent support). ? ? ?GOAL:  ?Provide needs based on ASPEN/SCCM guidelines ? ? ?MONITOR:  ?Vent status, TF tolerance, I & O's, Weight trends ? ?REASON FOR ASSESSMENT:  ? ?Ventilator ?  ? ?ASSESSMENT: Patient is a 61 yo female with hx of obesity, DM-2, CHF, COPD, GERD, Chronic lymphedema. Presents with altered mentation, in SVT, hypoxic and required intubation. ? ?Patient is currently intubated on ventilator - acute respiratory failure, severe sepsis/PNA. OGT (16 Fr.) ? ?MV: 8.3  L/min ?Temp (24hrs), Avg:98.5 ?F (36.9 ?C), Min:97.2 ?F (36.2 ?C), Max:100.6 ?F (38.1 ?C) ? ?Sedation-Versed and Fentanyl. MAP-95 @ 0730. Levophed. ? ?Medications: colace, solu-cortef, insulin, lopressor. IV-LR@ 70 ml/hr.  ? ?Weights: Current 101.2 kg (223 lbs). Admission wt-100 kg. ? ?Intake/Output Summary (Last 24 hours) at 06/23/2021 0940 ?Last data filed at 06/23/2021 0600 ?Gross per 24 hour  ?Intake 2942.33 ml  ?Output 550 ml  ?Net 2392.33 ml  ? ?  ? ?  Latest Ref Rng & Units 06/23/2021  ?  3:54 AM 06/22/2021  ?  3:02 AM 08/18/2020  ?  5:25 AM  ?BMP  ?Glucose 70 - 99 mg/dL 578   469   629    ?BUN 6 - 20 mg/dL 20   19   19     ?Creatinine 0.44 - 1.00 mg/dL   5.28   4.13    ?Sodium 135 - 145 mmol/L 138   134   141    ?Potassium 3.5 - 5.1 mmol/L 3.4   3.7   4.0    ?Chloride 98 - 111 mmol/L 106   94   94    ?CO2 22 - 32 mmol/L 22   21   34    ?Calcium 8.9 - 10.3 mg/dL 7.8   8.6   2.44    ?   ?NUTRITION - FOCUSED PHYSICAL EXAM: ?Deferred to follow up ? ? ?Diet Order:   ?Diet Order   ? ?       ?  Diet NPO time specified  Diet effective now       ?  ? ?   ?  ? ?  ? ? ?EDUCATION NEEDS:  ?Not appropriate for education at this time ? ?Skin:  Skin Assessment: Reviewed RN Assessment ? ?Last BM:  PTA ? ?Height:  ? ?Ht Readings from Last 1 Encounters:  ?06/22/21 5\' 1"  (1.549 m)  ? ? ?Weight:  ? ?Wt Readings from Last 1 Encounters:  ?06/23/21 101.2 kg  ? ? ?Ideal Body Weight:   48 kg ? ?BMI:  Body mass index is 42.16 kg/m?. ? ?Estimated Nutritional Needs:  ? ?Kcal:  1100-1400 ? ?Protein:  90-99 gr ? ?Fluid:  per MD goal ? ? ? MS,RD,CSG,LDN ?Contact: AMION ?

## 2021-06-23 NOTE — TOC Progression Note (Signed)
?  Transition of Care (TOC) Screening Note ? ? ?Patient Details  ?Name: Gina Bradley ?Date of Birth: Feb 13, 1961 ? ? ?Transition of Care (TOC) CM/SW Contact:    ?Leitha Bleak, RN ?Phone Number: ?06/23/2021, 1:56 PM ? ? ? ?Transition of Care Department [facility]) has reviewed patient and no TOC needs have been identified at this time. We will continue to monitor patient advancement through interdisciplinary progression rounds. If new patient transition needs arise, please place a TOC consult. ? ? ? ? ? ?  ?Barriers to Discharge: Continued Medical Work up ?  ?  ?                ?  ?   ?

## 2021-06-23 NOTE — Assessment & Plan Note (Addendum)
-  Secondary to hypotension and sepsis ?-Stable/trending down ?-RUQ US--neg ?-LFTs now improved and AST is 33 and ALT is 53  ?

## 2021-06-23 NOTE — Progress Notes (Signed)
?  ?       ?PROGRESS NOTE ? ?Gina Bradley [id number] DOB: November 30, 1960 DOA: 06/22/2021 ?PCP: Redmond School, MD ? ?Brief History:  ?61 y/o female with CAD, smoker, heart failure, GERD, type 2 DM, hypertension, SVT, peripheral neuropathy, steroid dependent COPD, chronic pain history of DVT, chronic lymphedema, spinal stenosis, was recently diagnosed with compression deformity of the L4 vertebrae and subacute fracture of the right inferior pubic rami seen in the ED on 06/06/2021.  Patient was discharged and given orthopedic follow-up and had been prescribed hydrocodone and Robaxin.  She demonstrated that she was able to ambulate with no difficulty at that time and it was felt fracture had occurred likely 1 month prior to that ED visit.  She has been mostly immobile at home due to pain and discomfort and there was high suspicion for pulmonary embolus and venous thromboembolism. ? ?She presented to the ED by EMS today with altered mentation.  When she arrived she was in SVT with heart rate greater than 200.  The ED attempted cardioversion at 37 J with no success.  She was then treated with adenosine 6 mg followed by 12 mg and converted to sinus tachycardia.  Patient has been unable to give history.  She remained hypoxic and tachypneic with pulse ox of 80% on room air.  She was initially placed on a nonrebreather.   Patient also became severely agitated, remained hypoxic, and unable to receive necessary CT scanning therefore she was intubated due to high suspicion for PE in setting of immobility.   Central line was placed in the ED.  Intensivist was consulted in [place] and said that patient could remain at [facility], ICU. ? ?Patient was started on broad-spectrum antibiotic therapy via the sepsis protocols.  Initially elevated lactic acid at 6.0 improved to less than 1.5 after IV fluid hydration.  WBC was markedly elevated at 24. ? ?After arriving up to ICU bed she went back into SVT and given additional 6 mg of  adenosine and went back into sinus tachycardia.  Cardiology evaluated and started on IV amiodarone infusion for persistent SVT presentation.  BPs have been soft and may have to start on pressor support.    ? ? ? ?Assessment and Plan: ?* Acute respiratory failure with hypoxia and hypercarbia (HCC) ?--Pt was intubated in the ED due to severe agitation and hypoxia causing inability to get CT scan with high suspicion for PE ?--Secondary to pneumonia ?--Personally reviewed CXR--bibasilar opacities, right greater than left ?--Appreciate PCCM ?-- Start bronchodilators ?-- Currently on PRVC 400, FiO2 0.5, PEEP 5 ?-- Wean to extubation ?-CTA chest negative for PE, bilateral airspace opacities, subacute right and left rib fractures ? ?Severe sepsis ([place]) ?- Secondary to pneumonia ?-Presented with fever, elevated lactate, and leukocytosis ?-Continue IV fluids ?-Currently on Levophed 2 mcg/kg/min ?-Wean off Levophed as tolerated ?-Continue Solu-Cortef ? ?Lobar pneumonia ([place]) ?- Continue ceftriaxone and doxycycline ?-Follow tracheal aspirate cultures ?-Follow blood culture ?-Urine Streptococcus pneumoniae antigen negative ?-MRSA neg ? ?SVT (supraventricular tachycardia) ([place]) ?--Cardioversion unsuccessful in ED ?--Treated with adenosine with conversion to sinus tachycardia ?--Pt has longstanding history of this and has been maintained on metoprolol  ?-- Appreciate cardiology consult ?-Continue IV amiodarone   ? ?Endometrial thickening on CT scan ?--Pt will need follow up with gynecology after discharge for an endometrial biopsy ? ?Transaminasemia ?Secondary to hypotension and sepsis ?RUQ [place] ? ?Obesity, Class III, BMI 40-49.9 (morbid obesity) ([place]) ?-BMI 42.16 ?-Lifestyle modification ? ?Opioid dependence ([place]) ?- PDMP  reviewed ?-Patient receives oxycodone 10 mg, #120 on a monthly basis ?-Patient receives hydrocodone 10/325, #120 on a monthly basis ?-UDS positive for barbiturates, THC, benzodiazepines, opiates ? ?Acute metabolic  encephalopathy ?Secondary to sepsis and hypercarbia ?Further work-up if no improvement ? ?Chronic heart failure with preserved ejection fraction (HFpEF) ([place]) ?- Given a dose of IV Lasix in the emergency department ?-Appears clinically euvolemic at present ?-08/18/2020 echo EF 70-75%, G1DD, EF 70-75% ? ?Right subacute Inferior pubic ramus fracture ?--she was seen in ED on 3/5 and outpatient orthopedic follow up ?--pain management, ambulation, PT eval when medically ready ?-CT abdomen--subacute right sacral insufficiency fractures, healing right obturator ring fracture, healing L3-5 transverse process fracture, nondisplaced left pubic body fracture ? ?Uncontrolled type 2 diabetes mellitus with hyperglycemia, without long-term current use of insulin ([place]) ?-08/18/2020 hemoglobin A1c 7.3 ?-Repeat hemoglobin A1c ?-Holding metformin ?--critical care SSI coverage and CBG monitoring  ? ?GERD (gastroesophageal reflux disease) ?- Continue pantoprazole ? ?HTN (hypertension), benign ?- Holding home dose metoprolol succinate secondary to hypotension ?-Holding losartan ? ? ? ? ? ? ? ? ?Status is: Inpatient ?Remains inpatient appropriate because: severity of illness requiring intubation and hemodynamic instability ? ? ? ?Family Communication:   no Family at bedside ? ?Consultants:  cardiology, PCCM ? ?Code Status:  FULL  ? ?DVT Prophylaxis:  [place] Heparin  ? ? ?Procedures: ?As Listed in Progress Note Above ? ?Antibiotics: ?Ceftriaxone 3/21>> ?Doxy 3/22>> ?Azithro 3/12 ? ? ? ? ? ?Subjective: ?Patient remains on the ventilator and sedated.  No respiratory distress, uncontrolled pain. ? ?Objective: ?Vitals:  ? 06/23/21 0600 06/23/21 0615 06/23/21 0630 06/23/21 0645  ?BP: (!) 103/50 (!) 103/50 (!) 101/48 (!) 102/46  ?Pulse: 74 73 72 71  ?Resp: (!) 21 (!) 23 20 20   ?Temp: 97.9 ?F (36.6 ?C) 97.9 ?F (36.6 ?C) 97.7 ?F (36.5 ?C) (!) 97.5 ?F (36.4 ?C)  ?TempSrc:      ?SpO2: 95% 96% 96% 96%  ?Weight:      ?Height:      ? ? ?Intake/Output Summary  (Last 24 hours) at 06/23/2021 0734 ?Last data filed at 06/23/2021 0600 ?Gross per 24 hour  ?Intake 3261.83 ml  ?Output 550 ml  ?Net 2711.83 ml  ? ?Weight change: 0 kg ?Exam: ? ?General:  Pt is alert, intubated and sedated, not in acute distress ?HEENT: No icterus, No thrush, No neck mass, [place]/AT ?Cardiovascular: RRR, S1/S2, no rubs, no gallops ?Respiratory: Diminished breath sounds bilateral.  Bilaterally rhonhii ?Abdomen: Soft/+BS, non tender, non distended, no guarding ?Extremities: trace LE edema, No lymphangitis, No petechiae, No rashes, no synovitis ? ? ?Data Reviewed: ?I have personally reviewed following labs and imaging studies ?Basic Metabolic Panel: ?Recent Labs  ?Lab 06/22/21 ?0302 06/23/21 ?0354  ?NA 134* 138  ?K 3.7 3.4*  ?CL 94* 106  ?CO2 21* 22  ?GLUCOSE 212* 187*  ?BUN 19 20  ?CREATININE 1.28* 0.76  ?CALCIUM 8.6* 7.8*  ?MG 2.0 1.9  ? ?Liver Function Tests: ?Recent Labs  ?Lab 06/22/21 ?0302 06/23/21 ?0354  ?AST 40 117*  ?ALT 32 95*  ?ALKPHOS 167* 96  ?BILITOT 0.7 0.4  ?PROT 7.6 5.1*  ?ALBUMIN 3.7 2.3*  ? ?No results for input(s): LIPASE, AMYLASE in the last 168 hours. ?No results for input(s): AMMONIA in the last 168 hours. ?Coagulation Profile: ?Recent Labs  ?Lab 06/22/21 ?0302  ?INR 1.0  ? ?CBC: ?Recent Labs  ?Lab 06/22/21 ?0302  ?WBC 24.4*  ?HGB 15.1*  ?HCT 47.3*  ?MCV 100.4*  ?PLT 225  ? ?  Cardiac Enzymes: ?No results for input(s): CKTOTAL, CKMB, CKMBINDEX, TROPONINI in the last 168 hours. ?BNP: ?Invalid input(s): POCBNP ?CBG: ?Recent Labs  ?Lab 06/22/21 ?1638 06/22/21 ?2059 06/23/21 ?0101 06/23/21 ?0402 06/23/21 ?[id number]  ?GLUCAP 226* 239* 199* 170* 202*  ? ?HbA1C: ?No results for input(s): HGBA1C in the last 72 hours. ?Urine analysis: ?   ?Component Value Date/Time  ? COLORURINE YELLOW 06/22/2021 0401  ? APPEARANCEUR CLEAR 06/22/2021 0401  ? LABSPEC >1.046 (H) 06/22/2021 0401  ? PHURINE 5.0 06/22/2021 0401  ? GLUCOSEU 50 (A) 06/22/2021 0401  ? HGBUR LARGE (A) 06/22/2021 0401  ? [place] NEGATIVE  06/22/2021 0401  ? [place] NEGATIVE 06/22/2021 0401  ? PROTEINUR 100 (A) 06/22/2021 0401  ? UROBILINOGEN 1.0 07/29/2009 1213  ? NITRITE NEGATIVE 06/22/2021 0401  ? LEUKOCYTESUR NEGATIVE 06/22/2021 0401

## 2021-06-23 NOTE — Progress Notes (Addendum)
Consent for PICC Line obtained from husband Tia Hieronymus) via telephone with second verify by Roney Mans. However, upon assessment, patient's veins found to be inadequate. Attempted PICC insertion in only decent vein, L. Basilic vein. Able to access vein, however, patient excessively moving causing vein to blow. Primary RN Roney Mans, had already titrated versed and Fentanyl as appropriate to help with sedation. This did not help with patient's agitation. Unable to proceed with PICC Insertion. Primary RN, and Dr. Arbutus Leas made aware. Per Dr. Sherene Sires, if needed, he could place IJ CVC tomorrow. Otherwise, would recommend placement by IR. ?

## 2021-06-23 NOTE — Progress Notes (Signed)
Attempted to contact patient's husband Ghadeer Kastelic) x2 to obtain consent for PICC insertion. No response at this time. Unable to leave voicemail. Will try again later. Patient currently has adequate access for IV therapies.   ?

## 2021-06-23 NOTE — Plan of Care (Signed)

## 2021-06-23 NOTE — Progress Notes (Signed)
Patient had become very restless/agitated and versed and Fentanyl titrated as needed. Dr Melvyn Novas in and aware that patient was reaching max order of 247mcg and made adjustments to fentanyl drip parameters. Dr Tat also made aware of current rate increases of versed and fentanyl. Patient currently appears to be resting comfortably.  Dr Tat made aware of total urine output of 353ml this shift and bolus ordered and being given per orders.  ?

## 2021-06-23 NOTE — Assessment & Plan Note (Addendum)
--  S/p Ceftriaxone and Doxy and now back on Vanc ?-Follow tracheal aspirate cultures>>flora ?-Follow blood culture neg to date ?-Urine Streptococcus pneumoniae antigen negative ?-MRSA neg ?-PCT 9.28>>4.88>>1.90  ?-Aspiration PNA ?

## 2021-06-23 NOTE — Assessment & Plan Note (Addendum)
-  PDMP reviewed ?-Patient receives oxycodone 10 mg, #120 on a monthly basis ?-Patient receives hydrocodone 10/325, #120 on a monthly basis ?-UDS positive for barbiturates, THC, benzodiazepines, opiates ?-C/w Oxy and was tarted back on Primadone  ?

## 2021-06-23 NOTE — Assessment & Plan Note (Addendum)
-  Initally suspected Secondary to sepsis and hypercarbia ?-UDS at presentation was positive for barbiturates/Benzo/opiate and cannabis ?-Peripheral neuropathy, chronic opiate use ?-Continue oxy, Seroquel and gabapentin ?-Restarted back on primidone ?-Decrease Klonopin to 0.5 mg every 6 hours and decrease Seroquel to 100 mg every 12 ?-Continues to be extremely agitated and may need Neuro involvement if not improving and repeat imaging  ?-Check EEG  ?-Recent MRI 3/31 and showed "No evidence of acute intracranial abnormality. Minimal chronic small-vessel ischemic changes within the cerebral white matter. Mild generalized parenchymal volume loss. Bilateral proptosis. Paranasal sinus disease, as described. Large bilateral middle ear/mastoid effusions." ? ?

## 2021-06-23 NOTE — Progress Notes (Signed)
? ?Progress Note ? ?Patient Name: Gina Bradley ?Date of Encounter: 06/23/2021 ? ?Primary Cardiologist: Rozann Lesches, MD ? ?Subjective  ? ?Sedated on ventilator but does wake up to voice.  Less agitated. ? ?Inpatient Medications  ?  ?Scheduled Meds: ? chlorhexidine gluconate (MEDLINE KIT)  15 mL Mouth Rinse BID  ? Chlorhexidine Gluconate Cloth  6 each Topical Q0600  ? docusate  100 mg Per Tube BID  ? enoxaparin (LOVENOX) injection  40 mg Subcutaneous Q24H  ? hydrocortisone sodium succinate  100 mg Intravenous Q12H  ? insulin aspart  0-9 Units Subcutaneous Q4H  ? mouth rinse  15 mL Mouth Rinse 10 times per day  ? metoprolol tartrate  2.5 mg Intravenous Q6H  ? pantoprazole (PROTONIX) IV  40 mg Intravenous QHS  ? polyethylene glycol  17 g Per Tube Daily  ? ?Continuous Infusions: ? amiodarone 30 mg/hr (06/23/21 0731)  ? cefTRIAXone (ROCEPHIN)  IV    ? doxycycline (VIBRAMYCIN) IV 100 mg (06/23/21 6283)  ? fentaNYL infusion INTRAVENOUS 125 mcg/hr (06/23/21 6629)  ? lactated ringers 70 mL/hr at 06/23/21 0103  ? midazolam 5 mg/hr (06/23/21 0400)  ? norepinephrine (LEVOPHED) Adult infusion 2 mcg/min (06/22/21 1939)  ? potassium chloride 10 mEq (06/23/21 0809)  ? ?PRN Meds: ?acetaminophen, docusate, fentaNYL, ipratropium-albuterol, ondansetron (ZOFRAN) IV, polyethylene glycol  ? ?Vital Signs  ?  ?Vitals:  ? 06/23/21 0645 06/23/21 0715 06/23/21 0730 06/23/21 0755  ?BP: (!) 102/46 (!) 126/94 120/82 130/84  ?Pulse: 71 67 73   ?Resp: 20 20 20    ?Temp: (!) 97.5 ?F (36.4 ?C) (!) 97.2 ?F (36.2 ?C) (!) 97.4 ?F (36.3 ?C)   ?TempSrc:   Axillary   ?SpO2: 96% 99% 98% 96%  ?Weight:      ?Height:      ? ? ?Intake/Output Summary (Last 24 hours) at 06/23/2021 0831 ?Last data filed at 06/23/2021 0600 ?Gross per 24 hour  ?Intake 3011.83 ml  ?Output 550 ml  ?Net 2461.83 ml  ? ?Filed Weights  ? 06/22/21 0256 06/22/21 1001 06/23/21 0500  ?Weight: 100 kg 100 kg 101.2 kg  ? ? ?Telemetry  ?  ?Sinus rhythm.  Personally reviewed. ? ?ECG  ?  ?An ECG  dated 06/23/2021 was personally reviewed today and demonstrated:  Sinus rhythm with decreased R wave progression. ? ?Physical Exam  ? ?GEN: No acute distress.   ?Neck: No JVD. ?Cardiac: RRR, no murmur, rub, or gallop.  ?Respiratory: Nonlabored. Clear to auscultation bilaterally. ?GI: Soft, nontender, bowel sounds present. ?MS: No edema. ?Psych: Sedated, wakes up to voice. ? ?Labs  ?  ?Chemistry ?Recent Labs  ?Lab 06/22/21 ?0302 06/23/21 ?0354  ?NA 134* 138  ?K 3.7 3.4*  ?CL 94* 106  ?CO2 21* 22  ?GLUCOSE 212* 187*  ?BUN 19 20  ?CREATININE 1.28* 0.76  ?CALCIUM 8.6* 7.8*  ?PROT 7.6 5.1*  ?ALBUMIN 3.7 2.3*  ?AST 40 117*  ?ALT 32 95*  ?ALKPHOS 167* 96  ?BILITOT 0.7 0.4  ?GFRNONAA 48* >60  ?ANIONGAP 19* 10  ?  ? ?Hematology ?Recent Labs  ?Lab 06/22/21 ?0302  ?WBC 24.4*  ?RBC 4.71  ?HGB 15.1*  ?HCT 47.3*  ?MCV 100.4*  ?MCH 32.1  ?MCHC 31.9  ?RDW 15.6*  ?PLT 225  ? ? ?Cardiac Enzymes ?Recent Labs  ?Lab 06/22/21 ?0352 06/22/21 ?0559  ?TROPONINIHS 94* 218*  ? ? ?BNP ?Recent Labs  ?Lab 06/23/21 ?0354  ?BNP 117.0*  ?  ? ? ?Radiology  ?  ?CT HEAD WO CONTRAST (5MM) ? ?  Result Date: 06/22/2021 ?CLINICAL DATA:  Altered mental status, unknown cause. EXAM: CT HEAD WITHOUT CONTRAST TECHNIQUE: Contiguous axial images were obtained from the base of the skull through the vertex without intravenous contrast. RADIATION DOSE REDUCTION: This exam was performed according to the departmental dose-optimization program which includes automated exposure control, adjustment of the mA and/or kV according to patient size and/or use of iterative reconstruction technique. COMPARISON:  Head CT 06/26/2019. FINDINGS: Factors affecting image quality: Nonstandard left posterior oblique head positioning increasing streak artifact in the posterior fossa. Brain: There is mild cerebral atrophy, small-vessel disease and atrophic ventriculomegaly with unremarkable cerebellum and brainstem. No asymmetry is seen worrisome for acute infarct, hemorrhage or mass. There  is no midline shift. Basal cisterns are patent. Vascular: There are scattered calcifications of the carotid siphons but no hyperdense central vasculature. Skull: There is calvarial osteopenia without evidence of fractures or focal lesion. The osteopenia appears to have increased since 2021. Sinuses/Orbits: Unremarkable orbital contents aside from proptosis and proliferated intraorbital fat which were both seen previously. There is no extraocular muscle thickening. There is mild membrane thickening in the ethmoid air cells, right maxillary sinus. Other sinuses and mastoid air cells are clear. Other: None. IMPRESSION: Chronic changes. No acute intracranial CT abnormality or interval changes. Electronically Signed   By: Telford Nab M.D.   On: 06/22/2021 05:03  ? ?CT Angio Chest Pulmonary Embolism (PE) W or WO Contrast ? ?Result Date: 06/22/2021 ?CLINICAL DATA:  Altered mental status.  SVT. EXAM: CT ANGIOGRAPHY CHEST CT ABDOMEN AND PELVIS WITH CONTRAST TECHNIQUE: Multidetector CT imaging of the chest was performed using the standard protocol during bolus administration of intravenous contrast. Multiplanar CT image reconstructions and MIPs were obtained to evaluate the vascular anatomy. Multidetector CT imaging of the abdomen and pelvis was performed using the standard protocol during bolus administration of intravenous contrast. RADIATION DOSE REDUCTION: This exam was performed according to the departmental dose-optimization program which includes automated exposure control, adjustment of the mA and/or kV according to patient size and/or use of iterative reconstruction technique. CONTRAST:  123m OMNIPAQUE IOHEXOL 350 MG/ML SOLN COMPARISON:  Chest CT 08/23/2017 FINDINGS: CTA CHEST FINDINGS Cardiovascular: Satisfactory opacification of the pulmonary arteries to the segmental level. No evidence of pulmonary embolism. Normal heart size. No pericardial effusion. Scattered atheromatous calcification of the aorta and great  vessels. Mediastinum/Nodes: No hematoma or pneumomediastinum. The enteric tube traverses the esophagus. Lungs/Pleura: Right mainstem intubation. Findings sent via epic chat. Airspace and streaky, atelectatic type opacity affecting all lobes bilaterally. No pulmonary edema or pleural fluid. No pneumothorax. Musculoskeletal: Anterior right third and fourth rib fractures which appear subacute with mild callus. Left anterior second rib fracture which is definitely subacute with callus. Multiple remote rib fractures with bridging callus. Review of the MIP images confirms the above findings. CT ABDOMEN and PELVIS FINDINGS Hepatobiliary: No focal liver abnormality.No evidence of biliary obstruction or stone. Pancreas: Unremarkable. Spleen: Unremarkable. Adrenals/Urinary Tract: Negative adrenals. No hydronephrosis or stone. Small bilateral renal cystic densities. Bladder wasting from increase in intrapelvic fat. Stomach/Bowel:  No obstruction. No visible bowel inflammation. Vascular/Lymphatic: No acute vascular abnormality. Extensive atheromatous plaque. No mass or adenopathy. Reproductive:Abnormally thick endometrium for age, measuring 13 mm. Other: No ascites or pneumoperitoneum. Musculoskeletal: No acute abnormalities. Remote L4 compression fracture with spurs and displaced inferior endplate causing biforaminal impingement at L4-5. Subacute right sacral insufficiency fracture with fat stranding and callus. Healing right obturator ring fractures. Healing left third through fifth lumbar transverse process fractures with callus. Nondisplaced left  pubic body fracture; no associated callus but fracture margins and constellation of findings suggest non acute timing. Review of the MIP images confirms the above findings. IMPRESSION: Chest CTA: 1. Multifocal pneumonia/atelectasis. 2. Negative for pulmonary embolism. 3. Right mainstem intubation. 4. Multiple subacute and chronic rib fractures. Abdominal CT: 1. No acute finding. 2.  Abnormally thickened endometrium for age at 13 mm, recommend gynecology follow-up. 3. Multiple healing insufficiency fractures as described. Electronically Signed   By: Jorje Guild M.D.   On: 0

## 2021-06-23 NOTE — Progress Notes (Signed)
? ?NAME:  Gina Bradley, MRN:  [id number], DOB:  Sep 01, 1960, LOS: 1 ?ADMISSION DATE:  06/22/2021, CONSULTATION DATE:  3/21 ?REFERRING MD:  Wynetta Emery, [facility], CHIEF COMPLAINT:  acute resp failure/ fever wth AMS and SVT ? ?History of Present Illness:  ?15 yowf quit smoking x 2 y PTA presenting 3 am 3/21 to [facility] ER with AMS and fever/agitation so ET  by EDP and w/u c/w multifocal pna and PCCM service consulted.  ? ?From [facility] intake as no direct hx available ?61 y/o female with CAD, smoker, heart failure, GERD, type 2 DM, hypertension, SVT, peripheral neuropathy, steroid dependent COPD, chronic pain history of DVT, chronic lymphedema, spinal stenosis, was recently diagnosed with compression deformity of the L4 vertebrae and subacute fracture of the right inferior pubic rami seen in the ED on 06/06/2021.  Patient was discharged and given orthopedic follow-up and had been prescribed hydrocodone and Robaxin.  She demonstrated that she was able to ambulate with no difficulty at that time and it was felt fracture had occurred likely 1 month prior to that ED visit.  She has been mostly immobile at home due to pain and discomfort and there was high suspicion for pulmonary embolus and venous thromboembolism. ?  ?She presented to the ED by EMS   with altered mentation.  When she arrived she was in SVT with heart rate greater than 200.  The ED attempted cardioversion at 41 J with no success.  She was then treated with adenosine 6 mg followed by 12 mg and converted to sinus tachycardia.  Patient has been unable to give history.  She remained hypoxic and tachypneic with pulse ox of 80% on room air.  She was initially placed on a nonrebreather.  However, she started spiking fevers and code sepsis was called given her tachycardia fever and leukocytosis and elevated lactic acid.  Patient also became severely agitated and unable to receive necessary CT scanning therefore she was intubated due to high suspicion for PE in setting of  immobility.  CT did not reveal a pulmonary embolus but noted multifocal pneumonia.  Central line was placed in the ED.  Intensivist was consulted in [place] and said that patient could remain at [facility], ICU. ?  ?Patient was started on broad-spectrum antibiotic therapy via the sepsis protocols.  Initially elevated lactic acid at 6.0 improved to less than 1.5 after IV fluid hydration.  WBC was markedly elevated at 24. ?  ?After arriving up to ICU bed she went back into SVT and given additional 6 mg of adenosine and went back into sinus tachycardia.   ?  ? ? ? ?Pertinent  Medical History  ?Anemia ? Cervical disc disease ? COPD  ? Diabetes mellitus without complication  ? Essential hypertension ?GERD  ? Heart failure  ?History of cardiac catheterization ?History of non-ST elevation myocardial infarction  ?Lymphedema ?Peripheral neuropathy ?Spinal stenosis  ?SVT  ? ? ? ?Significant Hospital Events: ?Including procedures, antibiotic start and stop dates in addition to other pertinent events   ?ET 3/21 ?R Fem cvl 3/21  ?Amiodarone drip 3/21  ? ? ?Scheduled Meds: ? chlorhexidine gluconate (MEDLINE KIT)  15 mL Mouth Rinse BID  ? Chlorhexidine Gluconate Cloth  6 each Topical Q0600  ? clonazePAM  2 mg Per Tube QID  ? docusate  100 mg Per Tube BID  ? enoxaparin (LOVENOX) injection  40 mg Subcutaneous Q24H  ? hydrocortisone sodium succinate  100 mg Intravenous Q12H  ? insulin aspart  0-9 Units Subcutaneous  Q4H  ? mouth rinse  15 mL Mouth Rinse 10 times per day  ? metoprolol tartrate  2.5 mg Intravenous Q6H  ? pantoprazole (PROTONIX) IV  40 mg Intravenous QHS  ? polyethylene glycol  17 g Per Tube Daily  ? ?Continuous Infusions: ? amiodarone 30 mg/hr (06/23/21 0731)  ? cefTRIAXone (ROCEPHIN)  IV 2 g (06/23/21 1030)  ? doxycycline (VIBRAMYCIN) IV 100 mg (06/23/21 8264)  ? fentaNYL infusion INTRAVENOUS 125 mcg/hr (06/23/21 1583)  ? lactated ringers 70 mL/hr at 06/23/21 0103  ? midazolam 5 mg/hr (06/23/21 1203)  ?  norepinephrine (LEVOPHED) Adult infusion 2 mcg/min (06/22/21 1939)  ? ?PRN Meds:.acetaminophen, docusate, fentaNYL, ipratropium-albuterol, ondansetron (ZOFRAN) IV, polyethylene glycol  ? ? ?Interim History / Subjective:  ?Requiring lots of sedation to keep synchoronous with vent and from pulling out tubes/ lines ? ?Objective   ?Blood pressure (!) 101/47, pulse 73, temperature 98.2 ?F (36.8 ?C), resp. rate (!) 21, height 5' 1"  (1.549 m), weight 101.2 kg, last menstrual period 01/05/2015, SpO2 92 %. ?   ?Vent Mode: PRVC ?FiO2 (%):  [40 %-60 %] 40 % ?Set Rate:  [20 bmp] 20 bmp ?Vt Set:  [400 mL] 400 mL ?PEEP:  [5 cmH20] 5 cmH20 ?Plateau Pressure:  [10 cmH20-17 cmH20] 10 cmH20  ? ?Intake/Output Summary (Last 24 hours) at 06/23/2021 1321 ?Last data filed at 06/23/2021 0600 ?Gross per 24 hour  ?Intake 2873.1 ml  ?Output 550 ml  ?Net 2323.1 ml  ? ?Filed Weights  ? 06/22/21 0256 06/22/21 1001 06/23/21 0500  ?Weight: 100 kg 100 kg 101.2 kg  ? ? ?Examination: ?Tmax   99.3 (down)   ?General appearance: acutely ill wf > stated age ?No jvd ?Oropharynx oral ET  ?Neck supple ?Lungs with a few scattered exp > insp rhonchi bilaterally ?RRR no s3 or or sign murmur ?Abd soft with limited excursion / og to suction  ?Extr warm with no edema or clubbing noted ?Neuro  Sensorium sedaated,  no apparent motor deficits   ?  ?  ?  ?I personally reviewed images and agree with radiology impression as follows:  ?CXR:   portable am 3/22 ?1. Increasingly dense patchy consolidation in the lower lung fields ?intermixed with increased atelectatic bands, findings likely ?reflecting a combination of multilobar atelectasis and pneumonia, ?with small left pleural effusion forming. ?2. Cardiomegaly. ?3. Adequately inserted support tubes. ?4. Aortic atherosclerosis. ? ?  ? ?Assessment & Plan:  ?1)  Acute hypoxemic/ hypercarbic resp failure in pt with  copd and probably sepsis / prob CAP   ?>>> vent settings reviewed/ approp s air trapping as long as keep RR  down with sedation  ? ?2)  CAP with sepsis  ?- RVP 3/21  neg covid/ flu ?- urine for legionellla 3/21>>> ?- urine for strep 3/21 neg ?- BC x 2 3/21  ?- ET gm stain and culture  3/22 >>> ?- Urine culture  3/21 >>> ?- MRSA PCR  3/21  neg  ?- PCT  3/22  9.28  ?- Zmax 3/21only  ? - Ctx 3/21 >>> ? - Doxy 3/21 >>> ?>>> no change rx / continue stress steroids and lowest possible dose of levophed  ? ?3)  Pos drug screen for barbiturates, THC. Benzo / opiates (latter two may have been from ER meds)  ?>>> add clonezepam per og as likely already develping tachyphylaxis from versed infusion  ? ?3) PSVT > amiodarone rx 3/21 / cards following ? ?Best Practice (right click and "Reselect all SmartList Selections"  daily)  ? ?Per [facility]  ? ?Labs   ?CBC: ?Recent Labs  ?Lab 06/22/21 ?0302 06/23/21 ?0801  ?WBC 24.4* 12.6*  ?HGB 15.1* 11.0*  ?HCT 47.3* 33.9*  ?MCV 100.4* 98.5  ?PLT 225 165  ? ? ?Basic Metabolic Panel: ?Recent Labs  ?Lab 06/22/21 ?0302 06/23/21 ?0354  ?NA 134* 138  ?K 3.7 3.4*  ?CL 94* 106  ?CO2 21* 22  ?GLUCOSE 212* 187*  ?BUN 19 20  ?CREATININE 1.28* 0.76  ?CALCIUM 8.6* 7.8*  ?MG 2.0 1.9  ? ?GFR: ?Estimated Creatinine Clearance: 81.7 mL/min (by C-G formula based on SCr of 0.76 mg/dL). ?Recent Labs  ?Lab 06/22/21 ?0302 06/22/21 ?0559 06/23/21 ?0354 06/23/21 ?0801  ?PROCALCITON  --   --  9.28  --   ?WBC 24.4*  --   --  12.6*  ?LATICACIDVEN 6.0* 1.4  --   --   ? ? ?Liver Function Tests: ?Recent Labs  ?Lab 06/22/21 ?0302 06/23/21 ?0354  ?AST 40 117*  ?ALT 32 95*  ?ALKPHOS 167* 96  ?BILITOT 0.7 0.4  ?PROT 7.6 5.1*  ?ALBUMIN 3.7 2.3*  ? ?No results for input(s): LIPASE, AMYLASE in the last 168 hours. ?No results for input(s): AMMONIA in the last 168 hours. ? ?ABG ?   ?Component Value Date/Time  ? PHART 7.26 (L) 06/22/2021 7241  ? PCO2ART 65 (H) 06/22/2021 9542  ? PO2ART 82 (L) 06/22/2021 4814  ? HCO3 27.9 06/22/2021 0605  ? ACIDBASEDEF 0.8 06/22/2021 0352  ? O2SAT 92.8 06/22/2021 0605  ?  ? ?Coagulation Profile: ?Recent Labs   ?Lab 06/22/21 ?0302  ?INR 1.0  ? ? ?Cardiac Enzymes: ?No results for input(s): CKTOTAL, CKMB, CKMBINDEX, TROPONINI in the last 168 hours. ? ?HbA1C: ?Hgb A1c MFr Bld  ?Date/Time Value Ref Range Status  ?06/23/2021 08:01 AM

## 2021-06-24 ENCOUNTER — Inpatient Hospital Stay (HOSPITAL_COMMUNITY): Payer: 59

## 2021-06-24 ENCOUNTER — Inpatient Hospital Stay: Payer: Self-pay

## 2021-06-24 DIAGNOSIS — A419 Sepsis, unspecified organism: Secondary | ICD-10-CM | POA: Diagnosis not present

## 2021-06-24 DIAGNOSIS — R6520 Severe sepsis without septic shock: Secondary | ICD-10-CM

## 2021-06-24 DIAGNOSIS — R7401 Elevation of levels of liver transaminase levels: Secondary | ICD-10-CM | POA: Diagnosis not present

## 2021-06-24 DIAGNOSIS — J189 Pneumonia, unspecified organism: Secondary | ICD-10-CM | POA: Diagnosis not present

## 2021-06-24 DIAGNOSIS — I471 Supraventricular tachycardia: Secondary | ICD-10-CM | POA: Diagnosis not present

## 2021-06-24 DIAGNOSIS — J9601 Acute respiratory failure with hypoxia: Secondary | ICD-10-CM | POA: Diagnosis not present

## 2021-06-24 DIAGNOSIS — J9602 Acute respiratory failure with hypercapnia: Secondary | ICD-10-CM | POA: Diagnosis not present

## 2021-06-24 DIAGNOSIS — G9341 Metabolic encephalopathy: Secondary | ICD-10-CM | POA: Diagnosis not present

## 2021-06-24 LAB — CBC WITH DIFFERENTIAL/PLATELET
Abs Immature Granulocytes: 0.21 10*3/uL — ABNORMAL HIGH (ref 0.00–0.07)
Basophils Absolute: 0 10*3/uL (ref 0.0–0.1)
Basophils Relative: 0 %
Eosinophils Absolute: 0 10*3/uL (ref 0.0–0.5)
Eosinophils Relative: 0 %
HCT: 33.5 % — ABNORMAL LOW (ref 36.0–46.0)
Hemoglobin: 10.3 g/dL — ABNORMAL LOW (ref 12.0–15.0)
Immature Granulocytes: 2 %
Lymphocytes Relative: 4 %
Lymphs Abs: 0.4 10*3/uL — ABNORMAL LOW (ref 0.7–4.0)
MCH: 30.5 pg (ref 26.0–34.0)
MCHC: 30.7 g/dL (ref 30.0–36.0)
MCV: 99.1 fL (ref 80.0–100.0)
Monocytes Absolute: 0.2 10*3/uL (ref 0.1–1.0)
Monocytes Relative: 2 %
Neutro Abs: 10.1 10*3/uL — ABNORMAL HIGH (ref 1.7–7.7)
Neutrophils Relative %: 92 %
Platelets: 168 10*3/uL (ref 150–400)
RBC: 3.38 MIL/uL — ABNORMAL LOW (ref 3.87–5.11)
RDW: 15.6 % — ABNORMAL HIGH (ref 11.5–15.5)
WBC: 11 10*3/uL — ABNORMAL HIGH (ref 4.0–10.5)
nRBC: 0.2 % (ref 0.0–0.2)

## 2021-06-24 LAB — URINALYSIS, COMPLETE (UACMP) WITH MICROSCOPIC
Bacteria, UA: NONE SEEN
Bilirubin Urine: NEGATIVE
Glucose, UA: 50 mg/dL — AB
Ketones, ur: NEGATIVE mg/dL
Leukocytes,Ua: NEGATIVE
Nitrite: NEGATIVE
Protein, ur: 30 mg/dL — AB
Specific Gravity, Urine: 1.02 (ref 1.005–1.030)
pH: 5 (ref 5.0–8.0)

## 2021-06-24 LAB — PROCALCITONIN: Procalcitonin: 4.88 ng/mL

## 2021-06-24 LAB — BLOOD GAS, ARTERIAL
Acid-base deficit: 3.3 mmol/L — ABNORMAL HIGH (ref 0.0–2.0)
Acid-base deficit: 0.6 mmol/L (ref 0.0–2.0)
Bicarbonate: 23.2 mmol/L (ref 20.0–28.0)
Bicarbonate: 22.8 mmol/L (ref 20.0–28.0)
Drawn by: 35043
Drawn by: 23430
FIO2: 60 %
FIO2: 60 %
O2 Saturation: 93.1 %
O2 Saturation: 95.6 %
Patient temperature: 38.5
Patient temperature: 38.5
pCO2 arterial: 49 mmHg — ABNORMAL HIGH (ref 32–48)
pCO2 arterial: 34 mmHg (ref 32–48)
pH, Arterial: 7.29 — ABNORMAL LOW (ref 7.35–7.45)
pH, Arterial: 7.44 (ref 7.35–7.45)
pO2, Arterial: 71 mmHg — ABNORMAL LOW (ref 83–108)
pO2, Arterial: 71 mmHg — ABNORMAL LOW (ref 83–108)

## 2021-06-24 LAB — GLUCOSE, CAPILLARY
Glucose-Capillary: 167 mg/dL — ABNORMAL HIGH (ref 70–99)
Glucose-Capillary: 305 mg/dL — ABNORMAL HIGH (ref 70–99)
Glucose-Capillary: 292 mg/dL — ABNORMAL HIGH (ref 70–99)
Glucose-Capillary: 227 mg/dL — ABNORMAL HIGH (ref 70–99)
Glucose-Capillary: 271 mg/dL — ABNORMAL HIGH (ref 70–99)
Glucose-Capillary: 241 mg/dL — ABNORMAL HIGH (ref 70–99)

## 2021-06-24 LAB — TROPONIN I (HIGH SENSITIVITY)
Troponin I (High Sensitivity): 26 ng/L — ABNORMAL HIGH (ref <18)
Troponin I (High Sensitivity): 23 ng/L — ABNORMAL HIGH (ref <18)

## 2021-06-24 LAB — COMPREHENSIVE METABOLIC PANEL
ALT: 100 U/L — ABNORMAL HIGH (ref 0–44)
AST: 83 U/L — ABNORMAL HIGH (ref 15–41)
Albumin: 2.4 g/dL — ABNORMAL LOW (ref 3.5–5.0)
Alkaline Phosphatase: 92 U/L (ref 38–126)
Anion gap: 9 (ref 5–15)
BUN: 15 mg/dL (ref 6–20)
CO2: 23 mmol/L (ref 22–32)
Calcium: 8.2 mg/dL — ABNORMAL LOW (ref 8.9–10.3)
Chloride: 104 mmol/L (ref 98–111)
Creatinine, Ser: 0.76 mg/dL (ref 0.44–1.00)
GFR, Estimated: >60 mL/min (ref >60)
Glucose, Bld: 207 mg/dL — ABNORMAL HIGH (ref 70–99)
Potassium: 3.9 mmol/L (ref 3.5–5.1)
Sodium: 136 mmol/L (ref 135–145)
Total Bilirubin: 0.2 mg/dL — ABNORMAL LOW (ref 0.3–1.2)
Total Protein: 5.4 g/dL — ABNORMAL LOW (ref 6.5–8.1)

## 2021-06-24 LAB — BRAIN NATRIURETIC PEPTIDE: B Natriuretic Peptide: 93 pg/mL (ref 0.0–100.0)

## 2021-06-24 LAB — MAGNESIUM: Magnesium: 1.9 mg/dL (ref 1.7–2.4)

## 2021-06-24 MED ORDER — FUROSEMIDE 10 MG/ML IJ SOLN
40.0000 mg | Freq: Once | INTRAMUSCULAR | Status: AC
  Administered 2021-06-24: 40 mg via INTRAVENOUS
  Filled 2021-06-24: qty 4

## 2021-06-24 MED ORDER — HYDROCORTISONE SOD SUC (PF) 100 MG IJ SOLR
50.0000 mg | Freq: Two times a day (BID) | INTRAMUSCULAR | Status: DC
  Administered 2021-06-25 – 2021-06-26 (×3): 50 mg via INTRAVENOUS
  Filled 2021-06-24 (×4): qty 2

## 2021-06-24 MED ORDER — IPRATROPIUM-ALBUTEROL 0.5-2.5 (3) MG/3ML IN SOLN
3.0000 mL | Freq: Three times a day (TID) | RESPIRATORY_TRACT | Status: DC
  Administered 2021-06-24 – 2021-06-26 (×7): 3 mL via RESPIRATORY_TRACT
  Filled 2021-06-24 (×7): qty 3

## 2021-06-24 MED ORDER — SODIUM CHLORIDE 0.9% FLUSH
10.0000 mL | Freq: Two times a day (BID) | INTRAVENOUS | Status: DC
  Administered 2021-06-24 – 2021-07-04 (×17): 10 mL

## 2021-06-24 MED ORDER — SODIUM CHLORIDE 0.9% FLUSH: 10.0000 mL | INTRAVENOUS | Status: DC | PRN

## 2021-06-24 MED ORDER — FUROSEMIDE 10 MG/ML IJ SOLN
20.0000 mg | Freq: Once | INTRAMUSCULAR | Status: AC
  Administered 2021-06-24: 20 mg via INTRAVENOUS
  Filled 2021-06-24: qty 2

## 2021-06-24 NOTE — Progress Notes (Signed)
Nutrition Follow-up ? ?DOCUMENTATION CODES:  ? ?Morbid obesity ? ?INTERVENTION:  ?Continue Vital 1.5 at 50 ml/h (1200 ml per day) ? ?Provides 1800 kcal, 81 gm protein, 917 ml free water daily ? ? ?NUTRITION DIAGNOSIS:  ? ?Inadequate oral intake related to acute illness (pneumonia) as evidenced by NPO status (requiring vent support). ? ?ongoing ? ?GOAL:  ? ?Provide needs based on ASPEN/SCCM guidelines ? ?MONITOR:  ? ?Vent status, TF tolerance, I & O's, Weight trends ? ?REASON FOR ASSESSMENT:  ? ?Ventilator ?  ? ?ASSESSMENT:  ? ?Patient is a 61 yo female with hx of obesity, DM-2, CHF, COPD, GERD, Chronic lymphedema. Presents with altered mentation, in SVT, hypoxic and required intubation. ? ?Attempted to follow up with pt today, however she was having procedure done at time of visit.  Spoke with RN who states Vital 1.5 infusing. Tolerating tube feedings.  ? ?Patient is currently intubated on ventilator support ?MV: 7.7 L/min ?Temp (24hrs), Avg:99.7 ?F (37.6 ?C), Min:98.3 ?F (36.8 ?C), Max:101.7 ?F (38.7 ?C) ? ?Current weight: 106.8 kg ? ?Noted moderate pitting BLE and generalized edema. ? ?Medications: dolace, SSI, protonix, miralax ? ?Labs: sodium 136 (WNL), potassium 3.9 (WNL),mAST 83, ALT 100, CBG's 167-305 x 24 hours ? ?IV drips: amio @ 16.67 m/hr, abx, fentanyl @ 30 ml/hr,  ? versed @ 46ml/hr ? ?Diet Order:   ?Diet Order   ? ?       ?  Diet NPO time specified  Diet effective now       ?  ? ?  ?  ? ?  ? ? ?EDUCATION NEEDS:  ? ?Not appropriate for education at this time ? ?Skin:  Skin Assessment: Reviewed RN Assessment ? ?Last BM:  PTA ? ?Height:  ? ?Ht Readings from Last 1 Encounters:  ?06/24/21 5\' 1"  (1.549 m)  ? ? ?Weight:  ? ?Wt Readings from Last 1 Encounters:  ?06/24/21 106.8 kg  ? ?BMI:  Body mass index is 44.49 kg/m?. ? ?Estimated Nutritional Needs:  ? ?Kcal:  1800-2000 ? ?Protein:  75-90g ? ?Fluid:  >/=1.8L ? ?06/26/21, RDN, LDN ?Clinical Nutrition ?

## 2021-06-24 NOTE — Progress Notes (Signed)
? ?Progress Note ? ?Patient Name: Gina Bradley ?Date of Encounter: 06/24/2021 ? ?[facility] Cardiologist: Rozann Lesches, MD  ? ?Subjective  ? ?Intubated and sedated. Notes from overnight reviewed as she was having pink, frothy sputum and repeat CXR showed increased congestion and worsening infiltrates.  ? ?Inpatient Medications  ?  ?Scheduled Meds: ? chlorhexidine gluconate (MEDLINE KIT)  15 mL Mouth Rinse BID  ? Chlorhexidine Gluconate Cloth  6 each Topical Q0600  ? clonazePAM  2 mg Per Tube QID  ? docusate  100 mg Per Tube BID  ? enoxaparin (LOVENOX) injection  40 mg Subcutaneous Q24H  ? furosemide  40 mg Intravenous Once  ? hydrocortisone sodium succinate  100 mg Intravenous Q12H  ? insulin aspart  0-9 Units Subcutaneous Q4H  ? ipratropium-albuterol  3 mL Nebulization TID  ? mouth rinse  15 mL Mouth Rinse 10 times per day  ? pantoprazole (PROTONIX) IV  40 mg Intravenous QHS  ? polyethylene glycol  17 g Per Tube Daily  ? ?Continuous Infusions: ? amiodarone 30 mg/hr (06/24/21 0600)  ? cefTRIAXone (ROCEPHIN)  IV Stopped (06/23/21 1100)  ? doxycycline (VIBRAMYCIN) IV 125 mL/hr at 06/24/21 0600  ? feeding supplement (VITAL 1.5 CAL) 1,000 mL (06/23/21 1850)  ? fentaNYL infusion INTRAVENOUS 300 mcg/hr (06/24/21 0600)  ? lactated ringers Stopped (06/24/21 0532)  ? midazolam 10 mg/hr (06/24/21 0600)  ? norepinephrine (LEVOPHED) Adult infusion Stopped (06/24/21 0117)  ? ?PRN Meds: ?acetaminophen, docusate, fentaNYL, ipratropium-albuterol, ondansetron (ZOFRAN) IV, polyethylene glycol  ? ?Vital Signs  ?  ?Vitals:  ? 06/24/21 0400 06/24/21 0448 06/24/21 0500 06/24/21 0600  ?BP: (!) 156/65 132/61 (!) 142/70 107/66  ?Pulse: 93  97 89  ?Resp: 14 (!) 33 13 13  ?Temp: (!) 101.7 ?F (38.7 ?C)  (!) 100.6 ?F (38.1 ?C) 99.5 ?F (37.5 ?C)  ?TempSrc:      ?SpO2: 92%  94% 94%  ?Weight:      ?Height:      ? ? ?Intake/Output Summary (Last 24 hours) at 06/24/2021 0813 ?Last data filed at 06/24/2021 6213 ?Gross per 24 hour  ?Intake  4458.45 ml  ?Output 975 ml  ?Net 3483.45 ml  ? ? ?  06/24/2021  ?  3:23 AM 06/23/2021  ?  5:00 AM 06/22/2021  ? 10:01 AM  ?Last 3 Weights  ?Weight (lbs) 235 lb 7.2 oz 223 lb 1.7 oz 220 lb 7.4 oz  ?Weight (kg) 106.8 kg 101.2 kg 100 kg  ?   ? ?Telemetry  ?  ?NSR, HR in 70's to 80's. No significant arrhythmias.  - Personally Reviewed ? ?ECG  ? ?No new tracings.  ? ?Physical Exam  ? ?GEN: Currently intubated and sedated.  ?Neck: JVD at 9-10 cm.  ?Cardiac: RRR, no murmurs, rubs, or gallops.  ?Respiratory: Decreased breath sounds along bases and scattered rhonchi. ?GI: Soft, nontender, non-distended  ?MS: Trace lower extremity edema bilaterally; No deformity. ?Psych: Unable to be assessed. Intubated and sedated.  ? ?Labs  ?  ?High Sensitivity Troponin:   ?Recent Labs  ?Lab 06/22/21 ?0352 06/22/21 ?0865 06/24/21 ?7846 06/24/21 ?9629  ?TROPONINIHS 94* 218* 26* 23*  ?   ?Chemistry ?Recent Labs  ?Lab 06/22/21 ?0302 06/23/21 ?0354 06/24/21 ?5284  ?NA 134* 138 136  ?K 3.7 3.4* 3.9  ?CL 94* 106 104  ?CO2 21* 22 23  ?GLUCOSE 212* 187* 207*  ?BUN _0 ?CREATININE 1.28* 0.76 0.76  ?CALCIUM 8.6* 7.8* 8.2*  ?MG 2.0 1.9 1.9  ?PROT 7.6  5.1* 5.4*  ?ALBUMIN 3.7 2.3* 2.4*  ?AST 40 117* 83*  ?ALT 32 95* 100*  ?ALKPHOS 167* 96 92  ?BILITOT 0.7 0.4 0.2*  ?GFRNONAA 48* >60 >60  ?ANIONGAP 19* 10 9  ?  ?Lipids No results for input(s): CHOL, TRIG, HDL, LABVLDL, LDLCALC, CHOLHDL in the last 168 hours.  ?Hematology ?Recent Labs  ?Lab 06/22/21 ?0302 06/23/21 ?0801 06/24/21 ?2956  ?WBC 24.4* 12.6* 11.0*  ?RBC 4.71 3.44* 3.38*  ?HGB 15.1* 11.0* 10.3*  ?HCT 47.3* 33.9* 33.5*  ?MCV 100.4* 98.5 99.1  ?MCH 32.1 32.0 30.5  ?MCHC 31.9 32.4 30.7  ?RDW 15.6* 15.8* 15.6*  ?PLT 225 165 168  ? ?Thyroid No results for input(s): TSH, FREET4 in the last 168 hours.  ?BNP ?Recent Labs  ?Lab 06/23/21 ?0354 06/24/21 ?2130  ?BNP 117.0* 93.0  ?  ?DDimer No results for input(s): DDIMER in the last 168 hours.  ? ?Radiology  ?  ?DG CHEST PORT 1 VIEW ? ?Result Date:  06/24/2021 ?CLINICAL DATA:  Ventilator dependent respiratory failure. EXAM: PORTABLE CHEST 1 VIEW COMPARISON:  Portable chest yesterday at 4:48 a.m. FINDINGS: 4:37 a.m., 06/24/2021. There are multiple overlying monitor wires. ETT tip is 3.7 cm over the carina. NGT enters the stomach but the intragastric course is not included in the exam. The heart is enlarged. There is increased central vascular prominence. Slight central interstitial edema has also developed, with again noted small left and interval developed small right pleural effusions. There are increased patchy opacities in the periphery of the upper lung fields. Patchy dense consolidation persists without significant change in the lower lung fields. The mediastinum is stable, with aortic atherosclerosis. There is thoracic spondylosis. IMPRESSION: 1. Increased perihilar vascular congestion and slight central interstitial edema with small pleural effusions unchanged on the left and new on the right. 2. Worsening peripheral patchy airspace infiltrates in the upper lung fields most likely due to worsening pneumonia with essentially unchanged patchy dense consolidation in the lower lung fields. Electronically Signed   By: Telford Nab M.D.   On: 06/24/2021 05:07  ? ?DG Chest Port 1 View ? ?Result Date: 06/23/2021 ?CLINICAL DATA:  Hypoxia with respiratory failure. EXAM: PORTABLE CHEST 1 VIEW COMPARISON:  Portable chest yesterday at 8:57 a.m. FINDINGS: ETT tip is 5 cm from the carina, NGT is well inside the stomach with the tip directed cephalad to the left, unchanged. There is overlying monitor wiring. The heart enlarged. Central vessels appear normal in caliber. There is increasingly dense patchy consolidation in the lower lung fields intermixed with increased atelectatic bands with a small left pleural effusion beginning to develop. There is aortic atherosclerosis with stable mediastinal configuration. Thoracic spondylosis. IMPRESSION: 1. Increasingly dense  patchy consolidation in the lower lung fields intermixed with increased atelectatic bands, findings likely reflecting a combination of multilobar atelectasis and pneumonia, with small left pleural effusion forming. 2. Cardiomegaly. 3. Adequately inserted support tubes. 4. Aortic atherosclerosis. Electronically Signed   By: Telford Nab M.D.   On: 06/23/2021 06:07  ? ?DG Chest Portable 1 View ? ?Result Date: 06/22/2021 ?CLINICAL DATA:  Intubation, tube manipulation EXAM: PORTABLE CHEST 1 VIEW COMPARISON:  06/22/2021 FINDINGS: Endotracheal tube now 4 cm above the carina. Other support apparatus stable. Heart is enlarged with similar mid and lower lung airspace opacities versus edema. No enlarging effusion or pneumothorax. Aorta atherosclerotic. IMPRESSION: Endotracheal tube 4 cm above the carina. Electronically Signed   By: Jerilynn Mages.  Shick M.D.   On: 06/22/2021 09:07  ? ?[place] EKG SITE  RITE ? ?Result Date: 06/23/2021 ?If [employer] not attached, placement could not be confirmed due to current cardiac rhythm.  ? ?Cardiac Studies  ? ?Echocardiogram: 08/18/2020 ?IMPRESSIONS  ? ? ? 1. Left ventricular ejection fraction, by estimation, is 70 to 75%. The  ?left ventricle has hyperdynamic function. The left ventricle has no  ?regional wall motion abnormalities. Left ventricular diastolic parameters  ?are consistent with Grade I diastolic  ?dysfunction (impaired relaxation).  ? 2. Right ventricular systolic function is normal. The right ventricular  ?size is normal.  ? 3. The mitral valve is normal in structure. No evidence of mitral valve  ?regurgitation. No evidence of mitral stenosis.  ? 4. The aortic valve is normal in structure. Aortic valve regurgitation is  ?not visualized. No aortic stenosis is present.  ? 5. The inferior vena cava is normal in size with greater than 50%  ?respiratory variability, suggesting right atrial pressure of 3 mmHg.  ? ?Patient Profile  ?   ?61 y.o. female w/ PMH of PSVT, Type 2 DM, HTN, HLD,  lymphedema and COPD who is currently admitted with acute hypoxic respiratory failure in the setting of PNA. Cardiology consulted due to SVT.  ? ?Assessment & Plan  ?  ?1. SVT ?- Known history of SVT prior to this admi

## 2021-06-24 NOTE — Progress Notes (Addendum)
RN called due to patient having some respiratory distress and with pink frothy sputum.   ?At bedside, patient was intubated and sedated, but was using accessory muscles  ?ABG done showed pH 7.2 9/49/71/20 3.2 at FiO2 of 60% ?Chest x-ray was done and showed Increased perihilar vascular congestion and slight central interstitial edema with small pleural effusions unchanged on the left and new on the right. Worsening peripheral patchy airspace infiltrates in the upper lung fields most likely due to worsening pneumonia with essentially unchanged patchy dense consolidation in the lower lung fields. ?IV Lasix 40 mg x 1 was given. ?EKG personally reviewed showed normal sinus rhythm at a rate of 95 bpm ?Dr. Oletta Darter [facility] physician) was consulted made adjustments to vent setting ?Procalcitonin was decreased to 4.88 from 9.28 (yesterday) ? ?Physical Exam ?BP (!) 142/70   Pulse 97   Temp (!) 100.6 ?F (38.1 ?C)   Resp 13   Ht 5\' 1"  (1.549 m)   Wt 106.8 kg   LMP 01/05/2015   SpO2 94%   BMI 44.49 kg/m?  ? ?Gen:-Intubated and sedated ?HEENT:- [place].AT, No sclera icterus ?Neck-Supple Neck,No JVD,.  ?Lungs-diffuse Rales was seen lower lobe ?CV- S1, S2 normal ?Abd-  +ve B.Sounds, Abd Soft, No tenderness,    ?Extremity/Skin:- +2  edema,   ?Psych-cannot be assessed at this time due to patient being intubated and sedated ?Neuro-cannot be assessed at this time due to patient being intubated and sedated ? ?Total time:  22 minutes ?This includes time reviewing the chart including progress notes, labs, EKGs, taking medical decisions, ordering labs and documenting findings. ? ? ?  ?

## 2021-06-24 NOTE — Progress Notes (Signed)
Inpatient Diabetes Program Recommendations ? ?AACE/ADA: New Consensus Statement on Inpatient Glycemic Control (2015) ? ?Target Ranges:  Prepandial:   less than 140 mg/dL ?     Peak postprandial:   less than 180 mg/dL (1-2 hours) ?     Critically ill patients:  140 - 180 mg/dL  ? ?Lab Results  ?Component Value Date  ? GLUCAP 292 (H) 06/24/2021  ? HGBA1C 7.5 (H) 06/23/2021  ? ? ?Review of Glycemic Control ? Latest Reference Range & Units 06/23/21 11:00 06/23/21 16:14 06/23/21 23:37 06/24/21 03:50 06/24/21 07:45  ?Glucose-Capillary 70 - 99 mg/dL 476 (H) 546 (H) 503 (H) 167 (H) 305 (H)  ?(H): Data is abnormally high ? ?Diabetes history: DM2 ?Outpatient Diabetes medications: Amaryl 4 mg qd ?Current orders for Inpatient glycemic control: Novolog 0-9 units correction q 4 hrs., Solucortef 100 mg q 12 hrs ? ?Inpatient Diabetes Program Recommendations:   ?If steroids continued and tube feeds, ?Consider: ?-ICU Glycemic control order set while pt. Is NPO on ventilator ?-Novolog 2 units q 4 hrs. (Hold if tube feed stopped or held for any reason) ? ?Thank you, ?Billy Fischer Maysoon Lozada, RN, MSN, CDE  ?Diabetes Coordinator ?Inpatient Glycemic Control Team ?Team Pager [phone] (8am-5pm) ?06/24/2021 12:44 PM ?. ? ? ? ? ? ?

## 2021-06-24 NOTE — Progress Notes (Signed)
Peripherally Inserted Central Catheter Placement ? ?The IV Nurse has discussed with the patient and/or persons authorized to consent for the patient, the purpose of this procedure and the potential benefits and risks involved with this procedure.  The benefits include less needle sticks, lab draws from the catheter, and the patient may be discharged home with the catheter. Risks include, but not limited to, infection, bleeding, blood clot (thrombus formation), and puncture of an artery; nerve damage and irregular heartbeat and possibility to perform a PICC exchange if needed/ordered by physician.  Alternatives to this procedure were also discussed.  Gina Bradley, fact sheet on infection prevention and patient information card has been provided to patient /or left at bedside. Consent obtained 06/23/21 from patient's husband [name]) via telephone with second verify by Roney Mans, RN ? ?PICC Placement Documentation  ?PICC Triple Lumen 06/24/21 Right Cephalic 34 cm 0 cm (Active)  ?Indication for Insertion or Continuance of Line Prolonged intravenous therapies 06/24/21 1120  ?Exposed Catheter (cm) 0 cm 06/24/21 1120  ?Site Assessment Clean, Dry, Intact;Ecchymotic 06/24/21 1120  ?Lumen #1 Status Flushed;Saline locked;Blood return noted 06/24/21 1120  ?Lumen #2 Status Flushed;Saline locked;Blood return noted 06/24/21 1120  ?Lumen #3 Status Flushed;Saline locked;Blood return noted 06/24/21 1120  ?Dressing Type Securing device 06/24/21 1120  ?Dressing Status Antimicrobial disc in place;Clean, Dry, Intact 06/24/21 1120  ?Line Care Connections checked and tightened 06/24/21 1120  ?Dressing Intervention New dressing 06/24/21 1120  ?Dressing Change Due 07/01/21 06/24/21 1120  ? ? ? ? ? ?[name] ?06/24/2021, 11:33 AM ? ?

## 2021-06-24 NOTE — Progress Notes (Addendum)
Came to do vent check.  Patient had pink, frothy secretions backed up tube and into HME.  Did ABG on patient, HME changed.  RN made call to [facility] and Dr. Josephine Cables was notified of patient change.  Elink MD (Sommers) made vent changes based on ABG results of 7.29, 49, 71, 23.2 and RT implemented on vent; PEEP increased to 8, Rate increased to 24, FIO2 increased to 70%. ?

## 2021-06-24 NOTE — Progress Notes (Signed)
? ?NAME:  Gina Bradley, MRN:  [id number], DOB:  06-05-1960, LOS: 2 ?ADMISSION DATE:  06/22/2021, CONSULTATION DATE:  3/21 ?REFERRING MD:  Wynetta Emery, [facility], CHIEF COMPLAINT:  acute resp failure/ fever wth AMS and SVT ? ?History of Present Illness:  ?61 yowf quit smoking x 2 y PTA presenting 3 am 3/21 to [facility] ER with AMS and fever/agitation so ET  by EDP and w/u c/w multifocal pna and PCCM service consulted.  ? ?From [facility] intake as no direct hx available ?61 y/o female with CAD, smoker, heart failure, GERD, type 2 DM, hypertension, SVT, peripheral neuropathy, steroid dependent COPD, chronic pain history of DVT, chronic lymphedema, spinal stenosis, was recently diagnosed with compression deformity of the L4 vertebrae and subacute fracture of the right inferior pubic rami seen in the ED on 06/06/2021.  Patient was discharged and given orthopedic follow-up and had been prescribed hydrocodone and Robaxin.  She demonstrated that she was able to ambulate with no difficulty at that time and it was felt fracture had occurred likely 1 month prior to that ED visit.  She has been mostly immobile at home due to pain and discomfort and there was high suspicion for pulmonary embolus and venous thromboembolism. ?  ?She presented to the ED by EMS   with altered mentation.  When she arrived she was in SVT with heart rate greater than 200.  The ED attempted cardioversion at 50 J with no success.  She was then treated with adenosine 6 mg followed by 12 mg and converted to sinus tachycardia.  Patient has been unable to give history.  She remained hypoxic and tachypneic with pulse ox of 80% on room air.  She was initially placed on a nonrebreather.  However, she started spiking fevers and code sepsis was called given her tachycardia fever and leukocytosis and elevated lactic acid.  Patient also became severely agitated and unable to receive necessary CT scanning therefore she was intubated due to high suspicion for PE in setting of  immobility.  CT did not reveal a pulmonary embolus but noted multifocal pneumonia.  Central line was placed in the ED.  Intensivist was consulted in [place] and said that patient could remain at [facility], ICU. ?  ?Patient was started on broad-spectrum antibiotic therapy via the sepsis protocols.  Initially elevated lactic acid at 6.0 improved to less than 1.5 after IV fluid hydration.  WBC was markedly elevated at 24. ?  ?After arriving up to ICU bed she went back into SVT and given additional 6 mg of adenosine and went back into sinus tachycardia.   ?  ? ? ? ?Pertinent  Medical History  ?Anemia ? Cervical disc disease ? COPD  ? Diabetes mellitus without complication  ? Essential hypertension ?GERD  ? Heart failure  ?History of cardiac catheterization ?History of non-ST elevation myocardial infarction  ?Lymphedema ?Peripheral neuropathy ?Spinal stenosis  ?SVT  ? ? ? ?Significant Hospital Events: ?Including procedures, antibiotic start and stop dates in addition to other pertinent events   ?ET 3/21>>>  ?R Fem cvl 3/21 >  3/23  ?Amiodarone drip 3/21  ?R [facility]  3/23 >>> ? ? ?Scheduled Meds: ? chlorhexidine gluconate (MEDLINE KIT)  15 mL Mouth Rinse BID  ? Chlorhexidine Gluconate Cloth  6 each Topical Q0600  ? clonazePAM  2 mg Per Tube QID  ? docusate  100 mg Per Tube BID  ? enoxaparin (LOVENOX) injection  40 mg Subcutaneous Q24H  ? hydrocortisone sodium succinate  100 mg Intravenous  Q12H  ? insulin aspart  0-9 Units Subcutaneous Q4H  ? ipratropium-albuterol  3 mL Nebulization TID  ? mouth rinse  15 mL Mouth Rinse 10 times per day  ? pantoprazole (PROTONIX) IV  40 mg Intravenous QHS  ? polyethylene glycol  17 g Per Tube Daily  ? sodium chloride flush  10-40 mL Intracatheter Q12H  ? ?Continuous Infusions: ? amiodarone 30 mg/hr (06/24/21 0842)  ? cefTRIAXone (ROCEPHIN)  IV 2 g (06/24/21 5400)  ? doxycycline (VIBRAMYCIN) IV 125 mL/hr at 06/24/21 0600  ? feeding supplement (VITAL 1.5 CAL) 1,000 mL (06/23/21 1850)  ?  fentaNYL infusion INTRAVENOUS 300 mcg/hr (06/24/21 0836)  ? lactated ringers Stopped (06/24/21 0532)  ? midazolam 10 mg/hr (06/24/21 1155)  ? norepinephrine (LEVOPHED) Adult infusion Stopped (06/24/21 0117)  ? ?PRN Meds:.acetaminophen, docusate, fentaNYL, ipratropium-albuterol, ondansetron (ZOFRAN) IV, polyethylene glycol, sodium chloride flush  ? ? ?Interim History / Subjective:  ?Still req lots of sedation with clonazepam, fent/ versed drips but no longer on levophed ? ?Objective   ?Blood pressure 111/64, pulse 93, temperature (!) 100.9 ?F (38.3 ?C), resp. rate 10, height 5' 1"  (1.549 m), weight 106.8 kg, last menstrual period 01/05/2015, SpO2 94 %. ?   ?Vent Mode: PRVC ?FiO2 (%):  [40 %-70 %] 60 % ?Set Rate:  [20 bmp-24 bmp] 24 bmp ?Vt Set:  [400 mL] 400 mL ?PEEP:  [5 cmH20-8 cmH20] 5 cmH20 ?Plateau Pressure:  [15 cmH20] 15 cmH20  ? ?Intake/Output Summary (Last 24 hours) at 06/24/2021 1314 ?Last data filed at 06/24/2021 1154 ?Gross per 24 hour  ?Intake 4458.45 ml  ?Output 1515 ml  ?Net 2943.45 ml  ? ?Filed Weights  ? 06/22/21 1001 06/23/21 0500 06/24/21 0323  ?Weight: 100 kg 101.2 kg 106.8 kg  ? ? ?Examination: ?Tmax    101.7 ?General appearance:    sedated on vent  ? No jvd ?Oropharynx et/og  ?Neck supple ?Lungs with a few scattered exp > insp rhonchi bilaterally ?RRR no s3 or or sign murmur ?Abd obese but soft with limited excursion  ?Extr  slt mottled  ?Neuro  Sensorium sedated ,  no apparent motor deficits  ?  ?  ?  ?I personally reviewed images and agree with radiology impression as follows:  ?CXR:   porable 3/23 ?1. Increased perihilar vascular congestion and slight central ?interstitial edema with small pleural effusions unchanged on the ?left and new on the right. ?2. Worsening peripheral patchy airspace infiltrates in the upper ?lung fields most likely due to worsening pneumonia with essentially ?unchanged patchy dense consolidation in the lower lung fields. ? ?  ? ?Assessment & Plan:  ?1)  Acute  hypoxemic/ hypercarbic resp failure in pt with  copd and probably sepsis / prob CAP   ?>>> vent settings reviewed/ asynchronous when not heavily sedated ? Need to change to PCV ?  ? ?2)  CAP with sepsis  ?- RVP 3/21  neg covid/ flu ?- urine for legionellla 3/21 neg ?- urine for strep 3/21 neg ?- BC x 2 3/21  ?- ET gm stain and culture  3/22 rare wbc, gpc's ?- Urine culture  3/21 neg  ?- MRSA PCR  3/21  neg  ?- PCT  3/22  9.28 and 4.88 3/23 ?- Zmax 3/21 only  ? - Ctx 3/21 >>> ? - Doxy 3/21 >>> ? >>> PCT trending down despite spike overnight with all neg cultures > no change rx feasible  ? ?3)  Pos drug screen for barbiturates, THC. Benzo / opiates (  latter two may have been from ER meds)  ?>>> add clonezepam per og as likely already develping tachyphylaxis from versed infusion  ? ?3) PSVT > amiodarone rx 3/21 / cards following ? ?Best Practice (right click and "Reselect all SmartList Selections" daily)  ? ?Per [facility]  ? ?Labs   ?CBC: ?Recent Labs  ?Lab 06/22/21 ?0302 06/23/21 ?0801 06/24/21 ?9471  ?WBC 24.4* 12.6* 11.0*  ?NEUTROABS  --   --  10.1*  ?HGB 15.1* 11.0* 10.3*  ?HCT 47.3* 33.9* 33.5*  ?MCV 100.4* 98.5 99.1  ?PLT 225 165 168  ? ? ?Basic Metabolic Panel: ?Recent Labs  ?Lab 06/22/21 ?0302 06/23/21 ?0354 06/24/21 ?2527  ?NA 134* 138 136  ?K 3.7 3.4* 3.9  ?CL 94* 106 104  ?CO2 21* 22 23  ?GLUCOSE 212* 187* 207*  ?BUN 19 20 15   ?CREATININE 1.28* 0.76 0.76  ?CALCIUM 8.6* 7.8* 8.2*  ?MG 2.0 1.9 1.9  ? ?GFR: ?Estimated Creatinine Clearance: 84.3 mL/min (by C-G formula based on SCr of 0.76 mg/dL). ?Recent Labs  ?Lab 06/22/21 ?0302 06/22/21 ?0559 06/23/21 ?0354 06/23/21 ?0801 06/24/21 ?1292  ?PROCALCITON  --   --  9.28  --  4.88  ?WBC 24.4*  --   --  12.6* 11.0*  ?LATICACIDVEN 6.0* 1.4  --   --   --   ? ? ?Liver Function Tests: ?Recent Labs  ?Lab 06/22/21 ?0302 06/23/21 ?0354 06/24/21 ?9090  ?AST 40 117* 83*  ?ALT 32 95* 100*  ?ALKPHOS 167* 96 92  ?BILITOT 0.7 0.4 0.2*  ?PROT 7.6 5.1* 5.4*  ?ALBUMIN 3.7 2.3* 2.4*   ? ?No results for input(s): LIPASE, AMYLASE in the last 168 hours. ?No results for input(s): AMMONIA in the last 168 hours. ? ?ABG ?   ?Component Value Date/Time  ? PHART 7.29 (L) 06/24/2021 0417  ? PCO2ART 49 (H) 06/24/2021

## 2021-06-24 NOTE — Progress Notes (Signed)
?  ?       ?PROGRESS NOTE ? ?Gina Bradley [id number] DOB: 07-Mar-1961 DOA: 06/22/2021 ?PCP: Redmond School, MD ? ?Brief History:  ?61 y/o female with CAD, smoker, heart failure, GERD, type 2 DM, hypertension, SVT, peripheral neuropathy, steroid dependent COPD, chronic pain history of DVT, chronic lymphedema, spinal stenosis, was recently diagnosed with compression deformity of the L4 vertebrae and subacute fracture of the right inferior pubic rami seen in the ED on 06/06/2021.  Patient was discharged and given orthopedic follow-up and had been prescribed hydrocodone and Robaxin.  She demonstrated that she was able to ambulate with no difficulty at that time and it was felt fracture had occurred likely 1 month prior to that ED visit.  She has been mostly immobile at home due to pain and discomfort and there was high suspicion for pulmonary embolus and venous thromboembolism. ? ?She presented to the ED by EMS today with altered mentation.  When she arrived she was in SVT with heart rate greater than 200.  The ED attempted cardioversion at 82 J with no success.  She was then treated with adenosine 6 mg followed by 12 mg and converted to sinus tachycardia.  Patient has been unable to give history.  She remained hypoxic and tachypneic with pulse ox of 80% on room air.  She was initially placed on a nonrebreather.   Patient also became severely agitated, remained hypoxic, and unable to receive necessary CT scanning therefore she was intubated due to high suspicion for PE in setting of immobility.   Central line was placed in the ED.  Intensivist was consulted in [place] and said that patient could remain at [facility], ICU. ? ?Patient was started on broad-spectrum antibiotic therapy via the sepsis protocols.  Initially elevated lactic acid at 6.0 improved to less than 1.5 after IV fluid hydration.  WBC was markedly elevated at 24. ? ?After arriving up to ICU bed she went back into SVT and given additional 6 mg of  adenosine and went back into sinus tachycardia.  Cardiology evaluated and started on IV amiodarone infusion for persistent SVT presentation.  BPs have been soft and may have to start on pressor support.   ? ?06/24/21:  received Lasix IV 40 mg at 0500, redosed IV lasix at 0930 for fluid overload; remains on vent with FiO2 up to 60%, PEEP 8, RR 24.  Tolerating enteral feeding.  Spike temp up to 101.1 ?PICC line placed--femoral line removed  ? ? ?Assessment and Plan: ?* Acute respiratory failure with hypoxia and hypercarbia (HCC) ?--Pt was intubated in the ED due to severe agitation and hypoxia causing inability to get CT scan with high suspicion for PE ?--Secondary to pneumonia ?--3/23 Personally reviewed CXR--bibasilar opacities,+pulm edema ?--Appreciate PCCM ?-- Continue bronchodilators ?-- Currently on PRVC 400, FiO2 0.6, PEEP 8 ?-- Wean to extubation ?-CTA chest negative for PE, bilateral airspace opacities, subacute right and left rib fractures ? ?Severe sepsis ([place]) ?- Secondary to pneumonia ?-Presented with fever, elevated lactate, and leukocytosis ?-Continue IV fluids>>saline lock as pt developed fluid overload ?-Currently on Levophed 2 mcg/kg/min>>titrated off ?-Wean off Levophed as tolerated ?-Continue Solu-Cortef>>start wean ?-continues to spike fevers up to 101.1 ?-repeat blood culture, UA/cult ? ?Multifocal pneumonia ?- Continue ceftriaxone and doxycycline ?-Follow tracheal aspirate cultures ?-Follow blood culture neg to date ?-Urine Streptococcus pneumoniae antigen negative ?-MRSA neg ?-PCT 9.28>>4.88 ? ?PSVT (paroxysmal supraventricular tachycardia) ([place]) ?--Cardioversion unsuccessful in ED ?--Treated with adenosine with conversion to sinus tachycardia ?--Pt has longstanding  history of this and has been maintained on metoprolol  ?-- Appreciate cardiology consult ?-Continue IV amiodarone >>remains in sinus ? ?Endometrial thickening on CT scan ?--Pt will need follow up with gynecology after discharge for  an endometrial biopsy ? ?Transaminasemia ?Secondary to hypotension and sepsis ?Stable/trending down ?RUQ [place] ? ?Obesity, Class III, BMI 40-49.9 (morbid obesity) ([place]) ?-BMI 42.16 ?-Lifestyle modification ? ?Opioid dependence ([place]) ?- PDMP reviewed ?-Patient receives oxycodone 10 mg, #120 on a monthly basis ?-Patient receives hydrocodone 10/325, #120 on a monthly basis ?-UDS positive for barbiturates, THC, benzodiazepines, opiates ? ?Acute metabolic encephalopathy ?Secondary to sepsis and hypercarbia ?Further work-up if no improvement ?Remains sedated on ventilator ? ?Chronic heart failure with preserved ejection fraction (HFpEF) ([place]) ?- Given a dose of IV Lasix in the emergency department ?-Appears clinically euvolemic at present ?-08/18/2020 echo EF 70-75%, G1DD, EF 70-75% ?-lasix IV 40 mg given 06/24/21 x 2 ? ?Right subacute Inferior pubic ramus fracture ?--she was seen in ED on 3/5 and outpatient orthopedic follow up ?--pain management, ambulation, PT eval when medically ready ?-CT abdomen--subacute right sacral insufficiency fractures, healing right obturator ring fracture, healing L3-5 transverse process fracture, nondisplaced left pubic body fracture ? ?Uncontrolled type 2 diabetes mellitus with hyperglycemia, without long-term current use of insulin ([place]) ?-08/18/2020 hemoglobin A1c 7.3 ?-06/23/21 hemoglobin A1c--7.5 ?-Holding metformin ?--critical care SSI coverage and CBG monitoring  ? ?GERD (gastroesophageal reflux disease) ?- Continue pantoprazole ? ?HTN (hypertension), benign ?- Holding home dose metoprolol succinate secondary to hypotension ?-Holding losartan ? ? ? ? ? ? ? ? ?Status is: Inpatient ?Remains inpatient appropriate because: hemodynamic instability and remains on ventilator ? ? ? ?Family Communication:   son updated at bedside 3/23 ? ?Consultants:  PCCM, cardiology ? ?Code Status:  FULL  ? ?DVT Prophylaxis:  [place] Lovenox ? ? ?Procedures: ?As Listed in Progress Note  Above ? ?Antibiotics: ?Ceftriaxone 3/21>> ?Doxy 3/22>> ?Azithro 3/21 ?  ? ? ? ? ?Subjective: ?Pt remains sedated on ventilator.  No vomiting or diarrhea.  Easily dyssynchronous on vent. ? ?Objective: ?Vitals:  ? 06/24/21 1200 06/24/21 1229 06/24/21 1300 06/24/21 1357  ?BP: 115/73 135/67 111/64   ?Pulse: 96 93 93   ?Resp: 13 10 10    ?Temp: (!) 100.9 ?F (38.3 ?C) (!) 101.1 ?F (38.4 ?C) (!) 100.9 ?F (38.3 ?C)   ?TempSrc:      ?SpO2: 97% 96% 94% 96%  ?Weight:      ?Height:      ? ? ?Intake/Output Summary (Last 24 hours) at 06/24/2021 1431 ?Last data filed at 06/24/2021 1154 ?Gross per 24 hour  ?Intake 4458.45 ml  ?Output 1515 ml  ?Net 2943.45 ml  ? ?Weight change: 6.8 kg ?Exam: ? ?General:  Pt is sedated on vent, not in acute distress ?HEENT: No icterus, No thrush, No neck mass, [place]/AT ?Cardiovascular: RRR, S1/S2, no rubs, no gallops ?Respiratory: bilateral rhonchi ?Abdomen: Soft/+BS, non tender, non distended, no guarding ?Extremities: Trace LE edema, No lymphangitis, No petechiae, No rashes, no synovitis ? ? ?Data Reviewed: ?I have personally reviewed following labs and imaging studies ?Basic Metabolic Panel: ?Recent Labs  ?Lab 06/22/21 ?0302 06/23/21 ?0354 06/24/21 ?[id number]  ?NA 134* 138 136  ?K 3.7 3.4* 3.9  ?CL 94* 106 104  ?CO2 21* 22 23  ?GLUCOSE 212* 187* 207*  ?BUN 19 20 15   ?CREATININE 1.28* 0.76 0.76  ?CALCIUM 8.6* 7.8* 8.2*  ?MG 2.0 1.9 1.9  ? ?Liver Function Tests: ?Recent Labs  ?Lab 06/22/21 ?0302 06/23/21 ?0354 06/24/21 ?[id number]  ?  AST 40 117* 83*  ?ALT 32 95* 100*  ?ALKPHOS 167* 96 92  ?BILITOT 0.7 0.4 0.2*  ?PROT 7.6 5.1* 5.4*  ?ALBUMIN 3.7 2.3* 2.4*  ? ?No results for input(s): LIPASE, AMYLASE in the last 168 hours. ?No results for input(s): AMMONIA in the last 168 hours. ?Coagulation Profile: ?Recent Labs  ?Lab 06/22/21 ?0302  ?INR 1.0  ? ?CBC: ?Recent Labs  ?Lab 06/22/21 ?0302 06/23/21 ?0801 06/24/21 ?[id number]  ?WBC 24.4* 12.6* 11.0*  ?NEUTROABS  --   --  10.1*  ?HGB 15.1* 11.0* 10.3*  ?HCT 47.3* 33.9* 33.5*  ?MCV  100.4* 98.5 99.1  ?PLT 225 165 168  ? ?Cardiac Enzymes: ?No results for input(s): CKTOTAL, CKMB, CKMBINDEX, TROPONINI in the last 168 hours. ?BNP: ?Invalid input(s): POCBNP ?CBG: ?Recent Labs  ?Lab 06/23/21 ?1614 06/23/21 ?2337 03/23/2

## 2021-06-24 NOTE — Progress Notes (Addendum)
eLink Physician-Brief Progress Note ?Patient Name: [name] ?DOB: 09/22/1960 ?MRN: [id number] ? ? ?Date of Service ? 06/24/2021  ?HPI/Events of Note ? Acute decrease in O2 saturation associated with pink frothy sputum. ABG on 60%/PRVC 20/TV 400/P 5 = 7.29/49/71. CXR reveals increasing infiltrate or pulmonary congestion.  ?eICU Interventions ? Plan: ?12 Lead EKG STAT. ?Cycle Troponin. ?Portable CXR STAT. ?Titrate sedation. ?Increase PRVC rate to 24 and PEEP to 8. ?Lasix 40 mg IV X 1.   ? ? ? ?Intervention Category ?Major Interventions: Hypoxemia - evaluation and management ? ?Mehtab Dolberry Cornelia Copa ?06/24/2021, 4:40 AM ?

## 2021-06-25 ENCOUNTER — Inpatient Hospital Stay (HOSPITAL_COMMUNITY): Payer: 59

## 2021-06-25 ENCOUNTER — Other Ambulatory Visit (HOSPITAL_COMMUNITY): Payer: Medicare Other

## 2021-06-25 DIAGNOSIS — G9341 Metabolic encephalopathy: Secondary | ICD-10-CM | POA: Diagnosis not present

## 2021-06-25 DIAGNOSIS — J189 Pneumonia, unspecified organism: Secondary | ICD-10-CM | POA: Diagnosis not present

## 2021-06-25 DIAGNOSIS — J9602 Acute respiratory failure with hypercapnia: Secondary | ICD-10-CM | POA: Diagnosis not present

## 2021-06-25 DIAGNOSIS — F1120 Opioid dependence, uncomplicated: Secondary | ICD-10-CM | POA: Diagnosis not present

## 2021-06-25 DIAGNOSIS — R7401 Elevation of levels of liver transaminase levels: Secondary | ICD-10-CM | POA: Diagnosis not present

## 2021-06-25 DIAGNOSIS — J9601 Acute respiratory failure with hypoxia: Secondary | ICD-10-CM | POA: Diagnosis not present

## 2021-06-25 DIAGNOSIS — A419 Sepsis, unspecified organism: Secondary | ICD-10-CM | POA: Diagnosis not present

## 2021-06-25 DIAGNOSIS — I471 Supraventricular tachycardia: Secondary | ICD-10-CM | POA: Diagnosis not present

## 2021-06-25 LAB — COMPREHENSIVE METABOLIC PANEL
ALT: 60 U/L — ABNORMAL HIGH (ref 0–44)
AST: 46 U/L — ABNORMAL HIGH (ref 15–41)
Albumin: 1.9 g/dL — ABNORMAL LOW (ref 3.5–5.0)
Alkaline Phosphatase: 68 U/L (ref 38–126)
Anion gap: 10 (ref 5–15)
BUN: 14 mg/dL (ref 6–20)
CO2: 24 mmol/L (ref 22–32)
Calcium: 7.4 mg/dL — ABNORMAL LOW (ref 8.9–10.3)
Chloride: 104 mmol/L (ref 98–111)
Creatinine, Ser: 0.73 mg/dL (ref 0.44–1.00)
GFR, Estimated: >60 mL/min (ref >60)
Glucose, Bld: 259 mg/dL — ABNORMAL HIGH (ref 70–99)
Potassium: 3 mmol/L — ABNORMAL LOW (ref 3.5–5.1)
Sodium: 138 mmol/L (ref 135–145)
Total Bilirubin: 0.3 mg/dL (ref 0.3–1.2)
Total Protein: 4.6 g/dL — ABNORMAL LOW (ref 6.5–8.1)

## 2021-06-25 LAB — BLOOD CULTURE ID PANEL (REFLEXED) - BCID2

## 2021-06-25 LAB — MRSA NEXT GEN BY PCR, NASAL: MRSA by PCR Next Gen: NOT DETECTED

## 2021-06-25 LAB — CBC
HCT: 27.2 % — ABNORMAL LOW (ref 36.0–46.0)
Hemoglobin: 8.5 g/dL — ABNORMAL LOW (ref 12.0–15.0)
MCH: 30.4 pg (ref 26.0–34.0)
MCHC: 31.3 g/dL (ref 30.0–36.0)
MCV: 97.1 fL (ref 80.0–100.0)
Platelets: 143 10*3/uL — ABNORMAL LOW (ref 150–400)
RBC: 2.8 MIL/uL — ABNORMAL LOW (ref 3.87–5.11)
RDW: 15.8 % — ABNORMAL HIGH (ref 11.5–15.5)
WBC: 6.4 10*3/uL (ref 4.0–10.5)
nRBC: 0.3 % — ABNORMAL HIGH (ref 0.0–0.2)

## 2021-06-25 LAB — PROCALCITONIN: Procalcitonin: 1.9 ng/mL

## 2021-06-25 LAB — CULTURE, RESPIRATORY W GRAM STAIN: Culture: NORMAL

## 2021-06-25 LAB — POCT I-STAT 7, (LYTES, BLD GAS, ICA,H+H)
Acid-Base Excess: 2 mmol/L (ref 0.0–2.0)
Bicarbonate: 27.2 mmol/L (ref 20.0–28.0)
Calcium, Ion: 1.11 mmol/L — ABNORMAL LOW (ref 1.15–1.40)
HCT: 29 % — ABNORMAL LOW (ref 36.0–46.0)
Hemoglobin: 9.9 g/dL — ABNORMAL LOW (ref 12.0–15.0)
O2 Saturation: 47 %
Patient temperature: 38.9
Potassium: 3.5 mmol/L (ref 3.5–5.1)
Sodium: 138 mmol/L (ref 135–145)
TCO2: 29 mmol/L (ref 22–32)
pCO2 arterial: 49.6 mmHg — ABNORMAL HIGH (ref 32–48)
pH, Arterial: 7.355 (ref 7.35–7.45)
pO2, Arterial: 30 mmHg — CL (ref 83–108)

## 2021-06-25 LAB — GLUCOSE, CAPILLARY
Glucose-Capillary: 267 mg/dL — ABNORMAL HIGH (ref 70–99)
Glucose-Capillary: 269 mg/dL — ABNORMAL HIGH (ref 70–99)
Glucose-Capillary: 235 mg/dL — ABNORMAL HIGH (ref 70–99)
Glucose-Capillary: 128 mg/dL — ABNORMAL HIGH (ref 70–99)
Glucose-Capillary: 197 mg/dL — ABNORMAL HIGH (ref 70–99)
Glucose-Capillary: 273 mg/dL — ABNORMAL HIGH (ref 70–99)

## 2021-06-25 LAB — BRAIN NATRIURETIC PEPTIDE: B Natriuretic Peptide: 89 pg/mL (ref 0.0–100.0)

## 2021-06-25 LAB — MAGNESIUM: Magnesium: 1.7 mg/dL (ref 1.7–2.4)

## 2021-06-25 MED ORDER — MAGNESIUM SULFATE 2 GM/50ML IV SOLN
2.0000 g | Freq: Once | INTRAVENOUS | Status: AC
  Administered 2021-06-25: 2 g via INTRAVENOUS
  Filled 2021-06-25: qty 50

## 2021-06-25 MED ORDER — FUROSEMIDE 10 MG/ML IJ SOLN
40.0000 mg | Freq: Two times a day (BID) | INTRAMUSCULAR | Status: DC
Start: 2021-06-25 — End: 2021-06-28
  Administered 2021-06-25 – 2021-06-27 (×6): 40 mg via INTRAVENOUS
  Filled 2021-06-25 (×7): qty 4

## 2021-06-25 MED ORDER — FUROSEMIDE 10 MG/ML IJ SOLN
40.0000 mg | Freq: Once | INTRAMUSCULAR | Status: AC
  Administered 2021-06-25: 40 mg via INTRAVENOUS
  Filled 2021-06-25: qty 4

## 2021-06-25 MED ORDER — SPIRONOLACTONE 25 MG PO TABS
50.0000 mg | ORAL_TABLET | Freq: Two times a day (BID) | ORAL | Status: DC
  Administered 2021-06-25 – 2021-07-05 (×19): 50 mg
  Filled 2021-06-25 (×20): qty 2

## 2021-06-25 MED ORDER — POTASSIUM CHLORIDE 10 MEQ/100ML IV SOLN
10.0000 meq | INTRAVENOUS | Status: DC
  Filled 2021-06-25: qty 100

## 2021-06-25 MED ORDER — VANCOMYCIN HCL 750 MG/150ML IV SOLN: 750.0000 mg | Freq: Two times a day (BID) | INTRAVENOUS | Status: DC

## 2021-06-25 MED ORDER — PROPOFOL 1000 MG/100ML IV EMUL
0.0000 ug/kg/min | INTRAVENOUS | Status: DC
  Administered 2021-06-25: 5 ug/kg/min via INTRAVENOUS
  Administered 2021-06-25 – 2021-06-26 (×2): 40 ug/kg/min via INTRAVENOUS
  Administered 2021-06-26: 30 ug/kg/min via INTRAVENOUS
  Filled 2021-06-25 (×2): qty 200

## 2021-06-25 MED ORDER — VANCOMYCIN HCL 2000 MG/400ML IV SOLN
2000.0000 mg | Freq: Once | INTRAVENOUS | Status: AC
  Administered 2021-06-25: 2000 mg via INTRAVENOUS
  Filled 2021-06-25: qty 400

## 2021-06-25 MED ORDER — SODIUM CHLORIDE 0.9 % IV SOLN: INTRAVENOUS | Status: DC | PRN

## 2021-06-25 MED ORDER — VANCOMYCIN HCL 1500 MG/300ML IV SOLN: 1500.0000 mg | INTRAVENOUS | Status: DC

## 2021-06-25 MED ORDER — POTASSIUM CHLORIDE 20 MEQ PO PACK
40.0000 meq | PACK | ORAL | Status: AC
  Administered 2021-06-25 (×2): 40 meq
  Filled 2021-06-25: qty 2

## 2021-06-25 MED ORDER — VANCOMYCIN HCL 2000 MG/400ML IV SOLN: 2000.0000 mg | Freq: Once | INTRAVENOUS | Status: DC

## 2021-06-25 MED ORDER — POTASSIUM CHLORIDE 10 MEQ/50ML IV SOLN
10.0000 meq | INTRAVENOUS | Status: AC
  Administered 2021-06-25 – 2021-06-26 (×6): 10 meq via INTRAVENOUS
  Filled 2021-06-25 (×6): qty 50

## 2021-06-25 NOTE — Progress Notes (Signed)
PHARMACY - PHYSICIAN COMMUNICATION ?CRITICAL VALUE ALERT - BLOOD CULTURE IDENTIFICATION (BCID) ? ?Gina Bradley is an 61 y.o. female who presented to [facility] on 06/22/2021 with a chief complaint of AMS. ? ?Assessment:  Staph spp growing in 1 of 6 bottles.  Vancomycin added earlier this afternoon due to persistent fever. ? ?Name of physician (or Provider) Contacted: Dr. Radene Knee ? ?Current antibiotics: Rocephin and vancomycin ? ?Changes to prescribed antibiotics recommended:  ?Recommendations declined by provider due to persistent fevers.   Monitor clinical progress to de-escalate. ? ?Results for orders placed or performed during the hospital encounter of 06/22/21  ?Blood Culture ID Panel (Reflexed) (Collected: 06/24/2021  4:05 PM)  ?Result Value Ref Range  ? Enterococcus faecalis NOT DETECTED NOT DETECTED  ? Enterococcus Faecium NOT DETECTED NOT DETECTED  ? Listeria monocytogenes NOT DETECTED NOT DETECTED  ? Staphylococcus species DETECTED (A) NOT DETECTED  ? Staphylococcus aureus (BCID) NOT DETECTED NOT DETECTED  ? Staphylococcus epidermidis NOT DETECTED NOT DETECTED  ? Staphylococcus lugdunensis NOT DETECTED NOT DETECTED  ? Streptococcus species NOT DETECTED NOT DETECTED  ? Streptococcus agalactiae NOT DETECTED NOT DETECTED  ? Streptococcus pneumoniae NOT DETECTED NOT DETECTED  ? Streptococcus pyogenes NOT DETECTED NOT DETECTED  ? A.calcoaceticus-baumannii NOT DETECTED NOT DETECTED  ? Bacteroides fragilis NOT DETECTED NOT DETECTED  ? Enterobacterales NOT DETECTED NOT DETECTED  ? Enterobacter cloacae complex NOT DETECTED NOT DETECTED  ? Escherichia coli NOT DETECTED NOT DETECTED  ? Klebsiella aerogenes NOT DETECTED NOT DETECTED  ? Klebsiella oxytoca NOT DETECTED NOT DETECTED  ? Klebsiella pneumoniae NOT DETECTED NOT DETECTED  ? Proteus species NOT DETECTED NOT DETECTED  ? Salmonella species NOT DETECTED NOT DETECTED  ? Serratia marcescens NOT DETECTED NOT DETECTED  ? Haemophilus influenzae NOT DETECTED NOT  DETECTED  ? Neisseria meningitidis NOT DETECTED NOT DETECTED  ? Pseudomonas aeruginosa NOT DETECTED NOT DETECTED  ? Stenotrophomonas maltophilia NOT DETECTED NOT DETECTED  ? Candida albicans NOT DETECTED NOT DETECTED  ? Candida auris NOT DETECTED NOT DETECTED  ? Candida glabrata NOT DETECTED NOT DETECTED  ? Candida krusei NOT DETECTED NOT DETECTED  ? Candida parapsilosis NOT DETECTED NOT DETECTED  ? Candida tropicalis NOT DETECTED NOT DETECTED  ? Cryptococcus neoformans/gattii NOT DETECTED NOT DETECTED  ? ? ?[name] D. Mina Marble, PharmD, BCPS, BCCCP ?06/25/2021, 6:34 PM ?

## 2021-06-25 NOTE — Progress Notes (Signed)
Patient core temp reading 102 F, ordered cooling blanket but none available right now. Ice packs applied. Will give tylenol when able to.  ? ?Will continue to monitor. ? ?Darrold Span ? ?

## 2021-06-25 NOTE — Progress Notes (Signed)
Nutrition Follow up ? ?DOCUMENTATION CODES:  ? ?Morbid obesity ? ?INTERVENTION:  ?Recommend change formula to -Vital HP 1.0 @ 50 ml/hr via OGT- (1200 ml). ? ?Provides: 1200 kcal, 104 gr protein and 1003 ml  ? ?Last BM-PTA ? ?NUTRITION DIAGNOSIS:  ? ?Inadequate oral intake related to acute illness (pneumonia) as evidenced by NPO status (requiring vent support). ?-addressed with enteral feeding ? ?GOAL:  ?Provide needs based on ASPEN/SCCM guidelines ?-ongoing ? ?MONITOR:  ?Vent status, TF tolerance, I & O's, Weight trends ? ? ?ASSESSMENT: Patient is a 61 yo female with hx of obesity, DM-2, CHF, COPD, GERD, Chronic lymphedema. Presents with altered mentation, in SVT, hypoxic and required intubation. ? ?Patient is currently intubated on ventilator - acute respiratory failure, severe sepsis/PNA. OGT (16 Fr.)  Nutrition needs reassessed. Talked with nursing. Patient may transfer to [facility]. RD recommended Vital 1.0. Patient is receiving Vital 1.5 currently. Recommend change formula.  ? ?MV: 18.9  L/min ?Temp (24hrs), Avg:101 ?F (38.3 ?C), Min:100 ?F (37.8 ?C), Max:102.2 ?F (39 ?C) ? ?Sedation-Versed and Fentanyl. MAP-75 ? ?Medications: Miralax daily colace BID, solu-cortef, insulin, lopressor.  ? ?Weights: Current 108 kg. Admission wt-100 kg. Gain of 8 kg x 3 days. ? ?Intake/Output Summary (Last 24 hours) at 06/25/2021 0919 ?Last data filed at 06/25/2021 0802 ?Gross per 24 hour  ?Intake 2797 ml  ?Output 1415 ml  ?Net 1382 ml  ?Patient is +7.3 liters since admission.  ? ? Labs: potassium 3.0 (replacing) ? ?  Latest Ref Rng & Units 06/25/2021  ?  3:06 AM 06/24/2021  ?  3:57 AM 06/23/2021  ?  3:54 AM  ?BMP  ?Glucose 70 - 99 mg/dL 518   841   660    ?BUN 6 - 20 mg/dL 14   15   20     ?Creatinine 0.44 - 1.00 mg/dL   6.30   1.60    ?Sodium 135 - 145 mmol/L 138   136   138    ?Potassium 3.5 - 5.1 mmol/L 3.0   3.9   3.4    ?Chloride 98 - 111 mmol/L 104   104   106    ?CO2 22 - 32 mmol/L 24   23   22     ?Calcium 8.9 - 10.3 mg/dL 7.4    8.2   7.8    ? ? ?Diet Order:   ?Diet Order   ? ?       ?  Diet NPO time specified  Diet effective now       ?  ? ?  ?  ? ?  ? ? ?EDUCATION NEEDS:  ?Not appropriate for education at this time ? ?Skin:  Skin Assessment: Reviewed RN Assessment ? ?Last BM:  PTA ? ?Height:  ? ?Ht Readings from Last 1 Encounters:  ?06/25/21 5\' 1"  (1.549 m)  ? ? ?Weight:  ? ?Wt Readings from Last 1 Encounters:  ?06/25/21 108 kg  ? ? ?Ideal Body Weight:   48 kg ? ?BMI:  Body mass index is 44.99 kg/m?. ? ?Estimated Nutritional Needs:  ? ?Kcal:  1100-1400 ? ?Protein:  90-99 ? ?Fluid:  <2 liters daily ? ? ?06/27/21 MS,RD,CSG,LDN ?Contact: AMION ?

## 2021-06-25 NOTE — Progress Notes (Signed)
? ?Progress Note ? ?Patient Name: Gina Bradley ?Date of Encounter: 06/25/2021 ? ?[facility] Cardiologist: Samuel McDowell, MD  ? ?Subjective  ? ?Intubated and sedated.  BP 123/57.  Febrile up to 101.5 this morning.  CVP 20. ? ?Inpatient Medications  ?  ?Scheduled Meds: ? chlorhexidine gluconate (MEDLINE KIT)  15 mL Mouth Rinse BID  ? Chlorhexidine Gluconate Cloth  6 each Topical Q0600  ? clonazePAM  2 mg Per Tube QID  ? docusate  100 mg Per Tube BID  ? enoxaparin (LOVENOX) injection  40 mg Subcutaneous Q24H  ? furosemide  40 mg Intravenous BID  ? hydrocortisone sodium succinate  50 mg Intravenous Q12H  ? insulin aspart  0-9 Units Subcutaneous Q4H  ? ipratropium-albuterol  3 mL Nebulization TID  ? mouth rinse  15 mL Mouth Rinse 10 times per day  ? pantoprazole (PROTONIX) IV  40 mg Intravenous QHS  ? polyethylene glycol  17 g Per Tube Daily  ? sodium chloride flush  10-40 mL Intracatheter Q12H  ? spironolactone  50 mg Per Tube BID  ? ?Continuous Infusions: ? amiodarone 30 mg/hr (06/25/21 0600)  ? cefTRIAXone (ROCEPHIN)  IV 2 g (06/25/21 0908)  ? doxycycline (VIBRAMYCIN) IV 125 mL/hr at 06/25/21 0600  ? feeding supplement (VITAL 1.5 CAL) 1,000 mL (06/23/21 1850)  ? fentaNYL infusion INTRAVENOUS 300 mcg/hr (06/25/21 0600)  ? midazolam 3 mg/hr (06/25/21 0757)  ? norepinephrine (LEVOPHED) Adult infusion Stopped (06/24/21 0117)  ? ?PRN Meds: ?acetaminophen, docusate, fentaNYL, ipratropium-albuterol, ondansetron (ZOFRAN) IV, polyethylene glycol, sodium chloride flush  ? ?Vital Signs  ?  ?Vitals:  ? 06/25/21 0600 06/25/21 0700 06/25/21 0800 06/25/21 0857  ?BP: (!) 124/50 (!) 121/54 (!) 122/52   ?Pulse: 87 84 80   ?Resp: (!) 34 (!) 34 (!) 34   ?Temp: (!) 101.5 ?F (38.6 ?C) (!) 100.8 ?F (38.2 ?C) 100.2 ?F (37.9 ?C)   ?TempSrc:      ?SpO2: 94% 94% 94% 93%  ?Weight:      ?Height:  5' 1" (1.549 m)    ? ? ?Intake/Output Summary (Last 24 hours) at 06/25/2021 1004 ?Last data filed at 06/25/2021 0802 ?Gross per 24 hour  ?Intake  2797 ml  ?Output 1370 ml  ?Net 1427 ml  ? ? ? ?  06/25/2021  ?  4:31 AM 06/24/2021  ?  3:23 AM 06/23/2021  ?  5:00 AM  ?Last 3 Weights  ?Weight (lbs) 238 lb 1.6 oz 235 lb 7.2 oz 223 lb 1.7 oz  ?Weight (kg) 108 kg 106.8 kg 101.2 kg  ?   ? ?Telemetry  ?  ?NSR, HR in 80's to 90's. No significant arrhythmias.  - Personally Reviewed ? ?ECG  ? ?No new tracings.  ? ?Physical Exam  ? ?GEN: Currently intubated and sedated.  ?Neck: +JVD  ?Cardiac: RRR, no murmurs, rubs, or gallops.  ?Respiratory: Decreased breath sounds along bases and scattered rhonchi. ?GI: Soft, nontender, non-distended  ?MS: 1+ lower extremity edema bilaterally; No deformity. ?Psych: Unable to be assessed. Intubated and sedated.  ? ?Labs  ?  ?High Sensitivity Troponin:   ?Recent Labs  ?Lab 06/22/21 ?0352 06/22/21 ?0559 06/24/21 ?0357 06/24/21 ?0621  ?TROPONINIHS 94* 218* 26* 23*  ? ?   ?Chemistry ?Recent Labs  ?Lab 06/23/21 ?0354 06/24/21 ?0357 06/25/21 ?0306  ?NA 138 136 138  ?K 3.4* 3.9 3.0*  ?CL 106 104 104  ?CO2 22 23 24  ?GLUCOSE 187* 207* 259*  ?BUN 20 15 14  ?CREATININE 0.76 0.76   0.73  ?CALCIUM 7.8* 8.2* 7.4*  ?MG 1.9 1.9 1.7  ?PROT 5.1* 5.4* 4.6*  ?ALBUMIN 2.3* 2.4* 1.9*  ?AST 117* 83* 46*  ?ALT 95* 100* 60*  ?ALKPHOS 96 92 68  ?BILITOT 0.4 0.2* 0.3  ?GFRNONAA >60 >60 >60  ?ANIONGAP _0 ? ?  ?Lipids No results for input(s): CHOL, TRIG, HDL, LABVLDL, LDLCALC, CHOLHDL in the last 168 hours.  ?Hematology ?Recent Labs  ?Lab 06/23/21 ?0801 06/24/21 ?1173 06/25/21 ?0306  ?WBC 12.6* 11.0* 6.4  ?RBC 3.44* 3.38* 2.80*  ?HGB 11.0* 10.3* 8.5*  ?HCT 33.9* 33.5* 27.2*  ?MCV 98.5 99.1 97.1  ?MCH 32.0 30.5 30.4  ?MCHC 32.4 30.7 31.3  ?RDW 15.8* 15.6* 15.8*  ?PLT 165 168 143*  ? ? ?Thyroid No results for input(s): TSH, FREET4 in the last 168 hours.  ?BNP ?Recent Labs  ?Lab 06/23/21 ?0354 06/24/21 ?5670 06/25/21 ?0306  ?BNP 117.0* 93.0 89.0  ? ?  ?DDimer No results for input(s): DDIMER in the last 168 hours.  ? ?Radiology  ?  ?DG CHEST PORT 1 VIEW ? ?Result  Date: 06/25/2021 ?CLINICAL DATA:  Follow-up bilateral lung opacities, ventilator dependent respiratory failure. EXAM: PORTABLE CHEST 1 VIEW COMPARISON:  Portable chest yesterday 4:37 a.m. FINDINGS: 5:20 a.m., 06/25/2021. ETT tip is 5.9 cm from the carina, previously 3.7 cm. NGT tip is well inside the stomach, pointing cephalad directed left. Right PICC interval insertion with tip in the distal SVC. The heart enlarged but the central vessels are normal caliber on today's exam. There is persistent patchy dense consolidation of the left-greater-than-right lower lung fields, with scattered ground-glass infiltrates in the upper lung fields primarily peripherally. There are persistent minimal pleural effusions which are unchanged. There is no new or worsening lung opacity. Stable mediastinum with aortic atherosclerosis. Stable thoracic cage. IMPRESSION: No interval change in the bilateral lung opacities and minimal pleural effusions. Perihilar vascular congestion is not seen on today's exam. Support tubes adequately inserted with new right PICC tip in the distal SVC. No pneumothorax. Electronically Signed   By: Telford Nab M.D.   On: 06/25/2021 07:25  ? ?DG CHEST PORT 1 VIEW ? ?Result Date: 06/24/2021 ?CLINICAL DATA:  Ventilator dependent respiratory failure. EXAM: PORTABLE CHEST 1 VIEW COMPARISON:  Portable chest yesterday at 4:48 a.m. FINDINGS: 4:37 a.m., 06/24/2021. There are multiple overlying monitor wires. ETT tip is 3.7 cm over the carina. NGT enters the stomach but the intragastric course is not included in the exam. The heart is enlarged. There is increased central vascular prominence. Slight central interstitial edema has also developed, with again noted small left and interval developed small right pleural effusions. There are increased patchy opacities in the periphery of the upper lung fields. Patchy dense consolidation persists without significant change in the lower lung fields. The mediastinum is stable,  with aortic atherosclerosis. There is thoracic spondylosis. IMPRESSION: 1. Increased perihilar vascular congestion and slight central interstitial edema with small pleural effusions unchanged on the left and new on the right. 2. Worsening peripheral patchy airspace infiltrates in the upper lung fields most likely due to worsening pneumonia with essentially unchanged patchy dense consolidation in the lower lung fields. Electronically Signed   By: Telford Nab M.D.   On: 06/24/2021 05:07  ? ?[place] EKG SITE RITE ? ?Result Date: 06/24/2021 ?If [employer] not attached, placement could not be confirmed due to current cardiac rhythm. ? ?US Abdomen Limited RUQ (LIVER/GB) ? ?Result Date: 06/24/2021 ?CLINICAL DATA:  Elevated liver enzymes EXAM:  ULTRASOUND ABDOMEN LIMITED RIGHT UPPER QUADRANT COMPARISON:  CT 06/22/2021, ultrasound 08/29/2013 FINDINGS: Gallbladder: No gallstones or wall thickening visualized. No sonographic Murphy sign noted by sonographer. Common bile duct: Diameter: 2.3 mm Liver: No focal lesion identified. Within normal limits in parenchymal echogenicity. Portal vein is patent on color Doppler imaging with normal direction of blood flow towards the liver. Other: None. IMPRESSION: Unremarkable right upper quadrant ultrasound. Electronically Signed   By: Jacob  Kahn M.D.   On: 06/24/2021 15:25   ? ?Cardiac Studies  ? ?Echocardiogram: 08/18/2020 ?IMPRESSIONS  ? ? ? 1. Left ventricular ejection fraction, by estimation, is 70 to 75%. The  ?left ventricle has hyperdynamic function. The left ventricle has no  ?regional wall motion abnormalities. Left ventricular diastolic parameters  ?are consistent with Grade I diastolic  ?dysfunction (impaired relaxation).  ? 2. Right ventricular systolic function is normal. The right ventricular  ?size is normal.  ? 3. The mitral valve is normal in structure. No evidence of mitral valve  ?regurgitation. No evidence of mitral stenosis.  ? 4. The aortic valve is normal in  structure. Aortic valve regurgitation is  ?not visualized. No aortic stenosis is present.  ? 5. The inferior vena cava is normal in size with greater than 50%  ?respiratory variability, suggesting right atrial

## 2021-06-25 NOTE — Progress Notes (Signed)
Patient placed on the same vent setting as [facility] had patient on due to high minute ventilation on previous settings. Patient also did not have an ABG after vent settings were changed. Patient is now currently on 8ccs. ?

## 2021-06-25 NOTE — Progress Notes (Signed)
? ?NAME:  Gina Bradley, MRN:  [id number], DOB:  [birth year], LOS: 3 ?ADMISSION DATE:  06/22/2021, CONSULTATION DATE:  3/21 ?REFERRING MD:  Wynetta Emery, [facility], CHIEF COMPLAINT:  acute resp failure/ fever wth AMS and SVT ? ?History of Present Illness:  ?61 yowf quit smoking x 2 y PTA presenting 3 am 3/21 to [facility] ER with AMS and fever/agitation so ET  by EDP and w/u c/w multifocal pna and PCCM service consulted.  ? ?From triad intake as no direct hx available ?61 y/o female with CAD, smoker, heart failure, GERD, type 2 DM, hypertension, SVT, peripheral neuropathy, steroid dependent COPD, chronic pain history of DVT, chronic lymphedema, spinal stenosis, was recently diagnosed with compression deformity of the L4 vertebrae and subacute fracture of the right inferior pubic rami seen in the ED on 06/06/2021.  Patient was discharged and given orthopedic follow-up and had been prescribed hydrocodone and Robaxin.  She demonstrated that she was able to ambulate with no difficulty at that time and it was felt fracture had occurred likely 1 month prior to that ED visit.  She has been mostly immobile at home due to pain and discomfort and there was high suspicion for pulmonary embolus and venous thromboembolism. ?  ?She presented to the ED by EMS   with altered mentation.  When she arrived she was in SVT with heart rate greater than 200.  The ED attempted cardioversion at 45 J with no success.  She was then treated with adenosine 6 mg followed by 12 mg and converted to sinus tachycardia.  Patient has been unable to give history.  She remained hypoxic and tachypneic with pulse ox of 80% on room air.  She was initially placed on a nonrebreather.  However, she started spiking fevers and code sepsis was called given her tachycardia fever and leukocytosis and elevated lactic acid.  Patient also became severely agitated and unable to receive necessary CT scanning therefore she was intubated due to high suspicion for PE in setting of  immobility.  CT did not reveal a pulmonary embolus but noted multifocal pneumonia.  Central line was placed in the ED.  Intensivist was consulted in [place] and said that patient could remain at [facility], ICU. ?  ?Patient was started on broad-spectrum antibiotic therapy via the sepsis protocols.  Initially elevated lactic acid at 6.0 improved to less than 1.5 after IV fluid hydration.  WBC was markedly elevated at 24. ?  ?After arriving up to ICU bed she went back into SVT and given additional 6 mg of adenosine and went back into sinus tachycardia.   ?  ? ? ? ?Pertinent  Medical History  ?Anemia ? Cervical disc disease ? COPD  ? Diabetes mellitus without complication  ? Essential hypertension ?GERD  ? Heart failure  ?History of cardiac catheterization ?History of non-ST elevation myocardial infarction  ?Lymphedema ?Peripheral neuropathy ?Spinal stenosis  ?SVT  ? ? ? ?Significant Hospital Events: ?Including procedures, antibiotic start and stop dates in addition to other pertinent events   ?ET 3/21>>>  ?R Fem cvl 3/21 >  3/23  ?Amiodarone drip 3/21  ?R [facility]  3/23 >>> ?Echo 3/24  ? ? ?Scheduled Meds: ? chlorhexidine gluconate (MEDLINE KIT)  15 mL Mouth Rinse BID  ? Chlorhexidine Gluconate Cloth  6 each Topical Q0600  ? clonazePAM  2 mg Per Tube QID  ? docusate  100 mg Per Tube BID  ? enoxaparin (LOVENOX) injection  40 mg Subcutaneous Q24H  ? hydrocortisone sodium succinate  50 mg Intravenous Q12H  ? insulin aspart  0-9 Units Subcutaneous Q4H  ? ipratropium-albuterol  3 mL Nebulization TID  ? mouth rinse  15 mL Mouth Rinse 10 times per day  ? pantoprazole (PROTONIX) IV  40 mg Intravenous QHS  ? polyethylene glycol  17 g Per Tube Daily  ? sodium chloride flush  10-40 mL Intracatheter Q12H  ? ?Continuous Infusions: ? amiodarone 30 mg/hr (06/25/21 0600)  ? cefTRIAXone (ROCEPHIN)  IV 2 g (06/25/21 0908)  ? doxycycline (VIBRAMYCIN) IV 125 mL/hr at 06/25/21 0600  ? feeding supplement (VITAL 1.5 CAL) 1,000 mL (06/23/21  1850)  ? fentaNYL infusion INTRAVENOUS 300 mcg/hr (06/25/21 0600)  ? midazolam 3 mg/hr (06/25/21 0757)  ? norepinephrine (LEVOPHED) Adult infusion Stopped (06/24/21 0117)  ? ?PRN Meds:.acetaminophen, docusate, fentaNYL, ipratropium-albuterol, ondansetron (ZOFRAN) IV, polyethylene glycol, sodium chloride flush  ? ? ?Interim History / Subjective:  ?Much more synchronous since "captured"  her with high baseline RR ? ?Objective   ?Blood pressure (!) 122/52, pulse 80, temperature 100.2 ?F (37.9 ?C), resp. rate (!) 34, height 5' 1"  (1.549 m), weight 108 kg, last menstrual period 01/05/2015, SpO2 93 %. ?CVP:  [18 mmHg] 18 mmHg  ?Vent Mode: PRVC ?FiO2 (%):  [60 %] 60 % ?Set Rate:  [24 bmp-34 bmp] 34 bmp ?Vt Set:  [400 mL-600 mL] 600 mL ?PEEP:  [5 cmH20-8 cmH20] 8 cmH20 ?Plateau Pressure:  [28 cmH20-30 cmH20] 30 cmH20  ? ?Intake/Output Summary (Last 24 hours) at 06/25/2021 0937 ?Last data filed at 06/25/2021 0802 ?Gross per 24 hour  ?Intake 2797 ml  ?Output 1415 ml  ?Net 1382 ml  ? ?Filed Weights  ? 06/23/21 0500 06/24/21 0323 06/25/21 0431  ?Weight: 101.2 kg 106.8 kg 108 kg  ? ?CVP:  [18 mmHg] 18 mmHg  ? ? ?Examination: ?Tmax    102.2  ?General appearance:    sedated on vent not breathing over backup rate/ not air trapping despite rate 34   ?Oropharynx  et / og ?Neck supple ?Lungs with a few scattered exp > insp rhonchi bilaterally ?RRR no s3 or or sign murmur ?Abd mild / mod distended though TF going ok / limited excursion ?Extr warm with 1+ pitting and chronic venous stasis changes ?Neuro  Sensorium sedated on Versed/ fent drips and clonazepam,  no apparent motor deficits  ? ? ? ?I personally reviewed images and agree with radiology impression as follows:  ?CXR:   portable 3/24  ?No interval change in the bilateral lung opacities and minimal ?pleural effusions. ?  ?Perihilar vascular congestion is not seen on today's exam. ?  ?Support tubes adequately inserted with new right PICC tip in the ?distal SVC. No pneumothorax. ?   ? ?Assessment & Plan:  ?1)  Acute hypoxemic/ hypercarbic resp failure in pt with  copd and probably sepsis / prob CAP   ?>>>no change vent, much better with higher RR synchronous now  ?>>> appears vol overloaded on exam and by cvp so add aldactone per tube to lasix IV and check echo  ? ?2)  CAP with sepsis  ?- RVP 3/21  neg covid/ flu ?- urine for legionellla 3/21 neg ?- urine for strep 3/21 neg ?- BC x 2 3/21 >>> ?- ET gm stain and culture  3/22 rare wbc, gpc's> nl flora ?- Urine culture  3/21 neg  ?- MRSA PCR  3/21  neg  ?- PCT  3/22  9.28 and 4.88 3/23 and 1.90 3/24  ?- Zmax 3/21 only  ? -  Ctx 3/21 >>> ? - Doxy 3/21 >>> ? >>> PCT trending down despite contiued  spikes overnight with all neg cultures > no change rx feasible  ? ?3)  Pos drug screen for barbiturates, THC. Benzo / opiates (latter two may have been from ER meds)  ?>>> added clonezepam per og as likely  tachyphylaxis from versed infusion > will try to wean this ? ?3) PSVT > amiodarone rx 3/21 / cards following ? ?Best Practice (right click and "Reselect all SmartList Selections" daily)  ? ?Per triad  ? ?Labs   ?CBC: ?Recent Labs  ?Lab 06/22/21 ?0302 06/23/21 ?0801 06/24/21 ?1994 06/25/21 ?1290  ?WBC 24.4* 12.6* 11.0* 6.4  ?NEUTROABS  --   --  10.1*  --   ?HGB 15.1* 11.0* 10.3* 8.5*  ?HCT 47.3* 33.9* 33.5* 27.2*  ?MCV 100.4* 98.5 99.1 97.1  ?PLT 225 165 168 143*  ? ? ?Basic Metabolic Panel: ?Recent Labs  ?Lab 06/22/21 ?0302 06/23/21 ?0354 06/24/21 ?4753 06/25/21 ?3917  ?NA 134* 138 136 138  ?K 3.7 3.4* 3.9 3.0*  ?CL 94* 106 104 104  ?CO2 21* 22 23 24   ?GLUCOSE 212* 187* 207* 259*  ?BUN 19 20 15 14   ?CREATININE 1.28* 0.76 0.76 0.73  ?CALCIUM 8.6* 7.8* 8.2* 7.4*  ?MG 2.0 1.9 1.9 1.7  ? ?GFR: ?Estimated Creatinine Clearance: 84.9 mL/min (by C-G formula based on SCr of 0.73 mg/dL). ?Recent Labs  ?Lab 06/22/21 ?0302 06/22/21 ?9217 06/23/21 ?0354 06/23/21 ?0801 06/24/21 ?8375 06/25/21 ?4237  ?PROCALCITON  --   --  9.28  --  4.88 1.90  ?WBC 24.4*  --   --   12.6* 11.0* 6.4  ?LATICACIDVEN 6.0* 1.4  --   --   --   --   ? ? ?Liver Function Tests: ?Recent Labs  ?Lab 06/22/21 ?0302 06/23/21 ?0354 06/24/21 ?0230 06/25/21 ?1720  ?AST 40 117* 83* 46*  ?ALT 32 95* 100* 60*  ?

## 2021-06-25 NOTE — Progress Notes (Signed)
?  ?       ?PROGRESS NOTE ? ?Gina Bradley ZOX:[id number] DOB: 08-06-1960 DOA: 06/22/2021 ?PCP: Elfredia NevinsFusco, Lawrence, MD ? ?Brief History:  ?61 y/o female with CAD, smoker, heart failure, GERD, type 2 DM, hypertension, SVT, peripheral neuropathy, steroid dependent COPD, chronic pain history of DVT, chronic lymphedema, spinal stenosis, was recently diagnosed with compression deformity of the L4 vertebrae and subacute fracture of the right inferior pubic rami seen in the ED on 06/06/2021.  Patient was discharged and given orthopedic follow-up and had been prescribed hydrocodone and Robaxin.  She demonstrated that she was able to ambulate with no difficulty at that time and it was felt fracture had occurred likely 1 month prior to that ED visit.  She has been mostly immobile at home due to pain and discomfort and there was high suspicion for pulmonary embolus and venous thromboembolism. ? ?She presented to the ED by EMS today with altered mentation.  When she arrived she was in SVT with heart rate greater than 200.  The ED attempted cardioversion at 50 J with no success.  She was then treated with adenosine 6 mg followed by 12 mg and converted to sinus tachycardia.  Patient has been unable to give history.  She remained hypoxic and tachypneic with pulse ox of 80% on room air.  She was initially placed on a nonrebreather.   Patient also became severely agitated, remained hypoxic, and unable to receive necessary CT scanning therefore she was intubated due to high suspicion for PE in setting of immobility.   Central line was placed in the ED.  Intensivist was consulted in [place] and said that patient could remain at [facility], ICU. ? ?Patient was started on broad-spectrum antibiotic therapy via the sepsis protocols.  Initially elevated lactic acid at 6.0 improved to less than 1.5 after IV fluid hydration.  WBC was markedly elevated at 24. ? ?After arriving up to ICU bed she went back into SVT and given additional 6 mg of  adenosine and went back into sinus tachycardia.  Cardiology evaluated and started on IV amiodarone infusion for persistent SVT presentation.  BPs have been soft and may have to start on pressor support.   ? ?06/24/21:  received Lasix IV 40 mg at 0500, redosed IV lasix at 0930 for fluid overload; remains on vent with FiO2 up to 60%, PEEP 8, RR 24.  Tolerating enteral feeding.  Spike temp up to 101.1 ?PICC line placed--femoral line removed ? ?06/25/21:  remains on vent-Vt increased to 600 and RR to 34>>more synchronous.  fOx at 60%.  CVP 20 so lasix IV 40 bid and echo ordered.  Continues to have fever but off levophed since 10 PM 06/23/21.  ? ? ? ?Assessment and Plan: ?* Acute respiratory failure with hypoxia and hypercarbia (HCC) ?--Pt was intubated in the ED due to severe agitation and hypoxia causing inability to get CT scan with high suspicion for PE ?--Secondary to pneumonia ?--3/23 Personally reviewed CXR--bibasilar opacities,+pulm edema ?--Appreciate PCCM ?-- Continue bronchodilators ?-- Currently on PRVC 600, FiO2 0.6, PEEP 8, RR 34 ?-- Wean to extubation ?-CTA chest negative for PE, bilateral airspace opacities, subacute right and left rib fractures ?06/26/22>>CVP 20, lasix 40 mg IV bid started by PCCM ? ?Severe sepsis (HCC) ?- Secondary to pneumonia ?-Presented with fever, elevated lactate, and leukocytosis ?-Continue IV fluids>>saline lock as pt developed fluid overload ?-Currently on Levophed 2 mcg/kg/min>>titrated off ?-Wean off Levophed as tolerated ?-Continue Solu-Cortef>>start wean ?-continues to spike fevers up  to 102.1 ?-repeat blood culture 3/23 neg ?-repeat UA no pyuria ? ?Multifocal pneumonia ?- Continue ceftriaxone and doxycycline ?-Follow tracheal aspirate cultures>>flora ?-Follow blood culture neg to date ?-Urine Streptococcus pneumoniae antigen negative ?-MRSA neg ?-PCT 9.28>>4.88>>1.90 ? ?PSVT (paroxysmal supraventricular tachycardia) (HCC) ?--Cardioversion unsuccessful in ED ?--Treated with  adenosine with conversion to sinus tachycardia ?--Pt has longstanding history of this and has been maintained on metoprolol  ?-- Appreciate cardiology consult ?-Continue IV amiodarone >>remains in sinus ? ?Endometrial thickening on CT scan ?--Pt will need follow up with gynecology after discharge for an endometrial biopsy ? ?Transaminasemia ?Secondary to hypotension and sepsis ?Stable/trending down ?RUQ US--neg ? ?Obesity, Class III, BMI 40-49.9 (morbid obesity) (HCC) ?-BMI 42.16 ?-Lifestyle modification ? ?Opioid dependence (HCC) ?- PDMP reviewed ?-Patient receives oxycodone 10 mg, #120 on a monthly basis ?-Patient receives hydrocodone 10/325, #120 on a monthly basis ?-UDS positive for barbiturates, THC, benzodiazepines, opiates ? ?Acute metabolic encephalopathy ?Secondary to sepsis and hypercarbia ?Further work-up if no improvement ?Remains sedated on ventilator ? ?Chronic heart failure with preserved ejection fraction (HFpEF) (HCC) ?- Given a dose of IV Lasix in the emergency department ?-Appears clinically euvolemic at present ?-08/18/2020 echo EF 70-75%, G1DD, EF 70-75% ?-lasix IV 40 mg given 06/24/21 x 2 ?-06/25/21--lasix IV ?Echo ? ?Right subacute Inferior pubic ramus fracture ?--she was seen in ED on 3/5 and outpatient orthopedic follow up ?--pain management, ambulation, PT eval when medically ready ?-CT abdomen--subacute right sacral insufficiency fractures, healing right obturator ring fracture, healing L3-5 transverse process fracture, nondisplaced left pubic body fracture ? ?Uncontrolled type 2 diabetes mellitus with hyperglycemia, without long-term current use of insulin (HCC) ?-08/18/2020 hemoglobin A1c 7.3 ?-06/23/21 hemoglobin A1c--7.5 ?-Holding metformin ?--critical care SSI coverage and CBG monitoring  ? ?GERD (gastroesophageal reflux disease) ?- Continue pantoprazole ? ?HTN (hypertension), benign ?- Holding home dose metoprolol succinate secondary to hypotension ?-Holding losartan ? ? ? ?Status is:  Inpatient ?Remains inpatient appropriate because: hemodynamic instability and remains on ventilator ?  ?  ?  ?Family Communication:   spouse+son updated at bedside 3/23 ?  ?Consultants:  PCCM, cardiology ?  ?Code Status:  FULL  ?  ?DVT Prophylaxis:  [place] Lovenox ?  ?  ?Procedures: ?As Listed in Progress Note Above ?  ?Antibiotics: ?Ceftriaxone 3/21>> ?Doxy 3/22>> ?Azithro 3/21 ? ? ? ?Subjective: ?Patient is sedated on vent.  No reports of vomiting, diarrhea.  Remains tachypneic. ? ?Objective: ?Vitals:  ? 06/25/21 0800 06/25/21 0857 06/25/21 0900 06/25/21 1000  ?BP: (!) 122/52  (!) 123/55 (!) 123/57  ?Pulse: 80  79 88  ?Resp: (!) 34  (!) 34 (!) 34  ?Temp: 100.2 ?F (37.9 ?C)  100.2 ?F (37.9 ?C) (!) 100.6 ?F (38.1 ?C)  ?TempSrc:      ?SpO2: 94% 93% 93% 93%  ?Weight:      ?Height:      ? ? ?Intake/Output Summary (Last 24 hours) at 06/25/2021 1044 ?Last data filed at 06/25/2021 4097 ?Gross per 24 hour  ?Intake 2797 ml  ?Output 1460 ml  ?Net 1337 ml  ? ?Weight change: 1.2 kg ?Exam: ? ?General:  Pt is alert, sedated on vent. not in acute distress ?HEENT: No icterus, No thrush, No neck mass, [place]/AT ?Cardiovascular: RRR, S1/S2, no rubs, no gallops ?Respiratory: bilateral rhonchi ?Abdomen: Soft/+BS, non tender, non distended, no guarding ?Extremities: 1 + LE edema, No lymphangitis, No petechiae, No rashes, no synovitis ? ? ?Data Reviewed: ?I have personally reviewed following labs and imaging studies ?Basic Metabolic Panel: ?Recent Labs  ?  Lab 06/22/21 ?0302 06/23/21 ?0354 06/24/21 ?3546 06/25/21 ?5681  ?NA 134* 138 136 138  ?K 3.7 3.4* 3.9 3.0*  ?CL 94* 106 104 104  ?CO2 21* 22 23 24   ?GLUCOSE 212* 187* 207* 259*  ?BUN 19 20 15 14   ?CREATININE 1.28* 0.76 0.76 0.73  ?CALCIUM 8.6* 7.8* 8.2* 7.4*  ?MG 2.0 1.9 1.9 1.7  ? ?Liver Function Tests: ?Recent Labs  ?Lab 06/22/21 ?0302 06/23/21 ?0354 06/24/21 ?06/25/21 06/25/21 ?2751  ?AST 40 117* 83* 46*  ?ALT 32 95* 100* 60*  ?ALKPHOS 167* 96 92 68  ?BILITOT 0.7 0.4 0.2* 0.3  ?PROT 7.6 5.1* 5.4*  4.6*  ?ALBUMIN 3.7 2.3* 2.4* 1.9*  ? ?No results for input(s): LIPASE, AMYLASE in the last 168 hours. ?No results for input(s): AMMONIA in the last 168 hours. ?Coagulation Profile: ?Recent Labs  ?Lab 06/22/21

## 2021-06-25 NOTE — Progress Notes (Signed)
Pharmacy Antibiotic Note ? ?Gina Bradley is a 61 y.o. female admitted on 06/22/2021 with AMS.  Patient was started on Rocephin and doxycycline for sepsis due to PNA.  She has persistent fever and now with concern for bacteremia, so Pharmacy has been consulted for vancomycin dosing. ? ?Renal function stable, Tmax 102.2, WBC WNL, PCT 1.9. ? ?Plan: ?Vanc 2gm IV x 1, then 1500mg  IV Q24H for AUC 542 using SCr 0.8 (goal AUC 400-550) ?Rocephin 2gm IV Q24H as ordered ?Monitor renal fxn, micro data, vanc levels if indicated vs de-escalating  ? ?Height: 5\' 1"  (154.9 cm) ?Weight: 108 kg (238 lb 1.6 oz) ?IBW/kg (Calculated) : 47.8 ? ?Temp (24hrs), Avg:101.3 ?F (38.5 ?C), Min:100.2 ?F (37.9 ?C), Max:102.4 ?F (39.1 ?C) ? ?Recent Labs  ?Lab 06/22/21 ?0302 06/22/21 ?06/24/21 06/23/21 ?0354 06/23/21 ?0801 06/24/21 ?06/25/21 06/25/21 ?9604  ?WBC 24.4*  --   --  12.6* 11.0* 6.4  ?CREATININE 1.28*  --  0.76  --  0.76 0.73  ?LATICACIDVEN 6.0* 1.4  --   --   --   --   ?  ?Estimated Creatinine Clearance: 84.9 mL/min (by C-G formula based on SCr of 0.73 mg/dL).   ? ?Allergies  ?Allergen Reactions  ? Bee Venom Other (See Comments)  ?  Unknown  ? Tdap [Tetanus-Diphth-Acell Pertussis] Other (See Comments)  ?  Unknown  ? ? ?Doxy 3/22 >> 3/24 ?CTX 3/21 >> ?Azith x1 3/21 ?Vanc 3/24 >>  ?  ?3/21 MRSA PCR - negative ?3/21 UCx - negative ?3/21 BCx - NGTD ?3/22 TA - negative ?3/23 BCx - GPC (anaerobic bottle), ?contaminant ? ?Gina Bradley D. 4/22, PharmD, BCPS, BCCCP ?06/25/2021, 4:59 PM ? ?

## 2021-06-25 NOTE — Progress Notes (Signed)
Inpatient Diabetes Program Recommendations ? ?AACE/ADA: New Consensus Statement on Inpatient Glycemic Control (2015) ? ?Target Ranges:  Prepandial:   less than 140 mg/dL ?     Peak postprandial:   less than 180 mg/dL (1-2 hours) ?     Critically ill patients:  140 - 180 mg/dL  ? ?Lab Results  ?Component Value Date  ? GLUCAP 269 (H) 06/25/2021  ? HGBA1C 7.5 (H) 06/23/2021  ? ? ?Review of Glycemic Control ? Latest Reference Range & Units 06/24/21 07:45 06/24/21 11:42 06/24/21 16:56 06/24/21 20:32 06/24/21 23:11 06/25/21 04:26 06/25/21 07:40  ?Glucose-Capillary 70 - 99 mg/dL 025 (H) 852 (H) 778 (H) 271 (H) 241 (H) 267 (H) 269 (H)  ?(H): Data is abnormally high ? ?Diabetes history: DM2 ?Outpatient Diabetes medications: Amaryl 4 mg qd ?Current orders for Inpatient glycemic control: Novolog 0-9 units correction q 4 hrs., Solucortef 50 mg q 12 hrs ?  ?Inpatient Diabetes Program Recommendations:   ?Noted steroids decreased by 50%. If steroids continued and tube feeds, ?-Novolog 2 units q 4 hrs. (Hold if tube feed stopped or held for any reason) ? ?Thank you, ?Billy Fischer Christi Wirick, RN, MSN, CDE  ?Diabetes Coordinator ?Inpatient Glycemic Control Team ?Team Pager [phone] (8am-5pm) ?06/25/2021 8:26 AM ? ? ? ? ? ?

## 2021-06-25 NOTE — Progress Notes (Signed)
?   06/25/21 1200  ?Clinical Encounter Type  ?Visited With Patient and family together  ?Visit Type Spiritual support  ?Referral From Physician  ?Consult/Referral To Chaplain  ?Spiritual Encounters  ?Spiritual Needs Emotional;Grief support  ?Stress Factors  ?Patient Stress Factors Major life changes  ?Family Stress Factors Loss  ?Advance Directives (For Healthcare)  ?Does Patient Have a Medical Advance Directive? Unable to assess, patient is non-responsive or altered mental status  ?Mental Health Advance Directives  ?Does Patient Have a Mental Health Advance Directive? Unable to assess, patient is non-responsive or altered mental status  ? ?Received referral from Progression meeting this AM that patient is critical and may need to transfer to [facility] in order to receive CCM over the weekend. Referral included knowledge of complex family dynamics. Chaplain found patient non responsive with son Clide Cliff and daughter Toniann Fail bedside today. Husband, Ethelene Browns, arrived shortly thereafter as Toniann Fail called him as she was on her way up to the hospital this morning. Chaplain provided spiritual support and assessment today.  ?It is clear that Clide Cliff has been mostly bedside during the patient stay. He has cognitive delays and it has been difficult to get in touch with the husband. Both Toniann Fail and Alinda Money, husband, now understand how sick Burgandy is and are now understanding that her prognosis is grave. Chaplain provided support to them and prayer bedside. Chaplain also was able to update patient face sheet to reflect correct phone numbers and relationships in addition to giving the family instructions on how to find Loriann once she is at [facility].  ?

## 2021-06-25 NOTE — Progress Notes (Signed)
Notified by RN of positive blood culture GPC 1/2 sets ? ?Wait for BCID>>if S aureus, start cefazolin vs vanc depending results. ? ?DTat ?

## 2021-06-25 NOTE — Progress Notes (Addendum)
61 year old female with coronary artery disease, HFpEF, diabetes and steroid-dependent COPD who was initially admitted at [facility] ICU with severe sepsis and acute hypoxic respiratory failure in the setting of multifocal pneumonia. ?She remained critically ill, was transferred to [facility] for further evaluation and treatment ? ?Upon arrival to [facility], ICU she is on midazolam and fentanyl infusion, on PRVC with FiO2 60% and PEEP of 8 with O2 sat in mid 90s, MAP of 90 not on vasopressors ? ?X-ray chest was repeated, showing bilateral lower lobe infiltrates and diffuse pulmonary edema ? ?Blood culture 1 out of 2 bottles growing GPC's, respiratory culture is growing GPC in pairs ? ? ?Assessment and plan: ?Acute hypoxic/hypercapnic respiratory failure ?Severe sepsis due to bilateral multifocal pneumonia ?Gram-positive cocci bacteremia versus contamination ?Acute septic encephalopathy ?Acute on chronic HFpEF ?Hypokalemia ?Congestive hepatopathy ?Recurrent SVT ?Type II MI due to demand cardiac ischemia ?Morbid obesity ?Anemia of critical illness ? ? ?Continue lung protective ventilation ?Repeat ABG now and will adjust vent setting based on updated ABG results ?Patient blood culture is growing gram-positive cocci in one of the 2 bottles likely contamination but she is severely sick with severe sepsis so could not rule out active infection ?We will switch doxycycline to IV vancomycin ?Continue IV ceftriaxone ?She does look volume overloaded we will give her 40 mg of IV Lasix now and continue 40 mg twice daily ?Continue aggressive electrolyte supplement ?Trend LFTs ?Patient is in sinus rhythm on amiodarone ?Serum troponin trended down ?Monitor H&H ?We will switch midazolam to propofol, considering she is morbidly obese and benzodiazepine can remain in the fatty tissues for long time ? ?Total critical care time: 40 minutes ? ?Performed by: Jacky Kindle ?  ?Critical care time was exclusive of  separately billable procedures and treating other patients. ?  ?Critical care was necessary to treat or prevent imminent or life-threatening deterioration. ?  ?Critical care was time spent personally by me on the following activities: development of treatment plan with patient and/or surrogate as well as nursing, discussions with consultants, evaluation of patient's response to treatment, examination of patient, obtaining history from patient or surrogate, ordering and performing treatments and interventions, ordering and review of laboratory studies, ordering and review of radiographic studies, pulse oximetry and re-evaluation of patient's condition. ?  ?Jacky Kindle MD ?[facility] ?See Amion for pager ?If no response to pager, please call [phone] until 7pm ?After 7pm, Please call E-link [phone] ? ? ?

## 2021-06-25 NOTE — Progress Notes (Signed)
Date and time results received: 06/25/21 1351 ? ?Test: Blood Cultures ? ?Critical Value: Positive for gram positive cocci ? ?Name of Provider Notified: Catarina Hartshorn, MD ? ?Orders Received? Or Actions Taken?: See new orders  ?

## 2021-06-25 NOTE — Progress Notes (Signed)
Report called to [facility] on 74M & [facility] notified of need for transport  ?

## 2021-06-26 ENCOUNTER — Inpatient Hospital Stay (HOSPITAL_COMMUNITY): Payer: 59

## 2021-06-26 DIAGNOSIS — E781 Pure hyperglyceridemia: Secondary | ICD-10-CM

## 2021-06-26 DIAGNOSIS — I5032 Chronic diastolic (congestive) heart failure: Secondary | ICD-10-CM | POA: Diagnosis not present

## 2021-06-26 DIAGNOSIS — J9602 Acute respiratory failure with hypercapnia: Secondary | ICD-10-CM | POA: Diagnosis not present

## 2021-06-26 DIAGNOSIS — I5031 Acute diastolic (congestive) heart failure: Secondary | ICD-10-CM

## 2021-06-26 DIAGNOSIS — J9601 Acute respiratory failure with hypoxia: Secondary | ICD-10-CM | POA: Diagnosis not present

## 2021-06-26 DIAGNOSIS — J189 Pneumonia, unspecified organism: Secondary | ICD-10-CM | POA: Diagnosis not present

## 2021-06-26 DIAGNOSIS — A419 Sepsis, unspecified organism: Secondary | ICD-10-CM | POA: Diagnosis not present

## 2021-06-26 LAB — POCT I-STAT 7, (LYTES, BLD GAS, ICA,H+H)
Acid-Base Excess: 0 mmol/L (ref 0.0–2.0)
Bicarbonate: 25.9 mmol/L (ref 20.0–28.0)
Calcium, Ion: 1.12 mmol/L — ABNORMAL LOW (ref 1.15–1.40)
HCT: 27 % — ABNORMAL LOW (ref 36.0–46.0)
Hemoglobin: 9.2 g/dL — ABNORMAL LOW (ref 12.0–15.0)
O2 Saturation: 88 %
Patient temperature: 37.4
Potassium: 4.2 mmol/L (ref 3.5–5.1)
Sodium: 138 mmol/L (ref 135–145)
TCO2: 27 mmol/L (ref 22–32)
pCO2 arterial: 50.3 mmHg — ABNORMAL HIGH (ref 32–48)
pH, Arterial: 7.322 — ABNORMAL LOW (ref 7.35–7.45)
pO2, Arterial: 61 mmHg — ABNORMAL LOW (ref 83–108)

## 2021-06-26 LAB — CBC
HCT: 27.3 % — ABNORMAL LOW (ref 36.0–46.0)
Hemoglobin: 8.7 g/dL — ABNORMAL LOW (ref 12.0–15.0)
MCH: 31.4 pg (ref 26.0–34.0)
MCHC: 31.9 g/dL (ref 30.0–36.0)
MCV: 98.6 fL (ref 80.0–100.0)
Platelets: 146 10*3/uL — ABNORMAL LOW (ref 150–400)
RBC: 2.77 MIL/uL — ABNORMAL LOW (ref 3.87–5.11)
RDW: 16.4 % — ABNORMAL HIGH (ref 11.5–15.5)
WBC: 10.9 10*3/uL — ABNORMAL HIGH (ref 4.0–10.5)
nRBC: 0.3 % — ABNORMAL HIGH (ref 0.0–0.2)

## 2021-06-26 LAB — COMPREHENSIVE METABOLIC PANEL
ALT: 48 U/L — ABNORMAL HIGH (ref 0–44)
AST: 48 U/L — ABNORMAL HIGH (ref 15–41)
Albumin: 1.6 g/dL — ABNORMAL LOW (ref 3.5–5.0)
Alkaline Phosphatase: 64 U/L (ref 38–126)
Anion gap: 8 (ref 5–15)
BUN: 14 mg/dL (ref 6–20)
CO2: 26 mmol/L (ref 22–32)
Calcium: 7 mg/dL — ABNORMAL LOW (ref 8.9–10.3)
Chloride: 103 mmol/L (ref 98–111)
Creatinine, Ser: 0.76 mg/dL (ref 0.44–1.00)
GFR, Estimated: >60 mL/min (ref >60)
Glucose, Bld: 285 mg/dL — ABNORMAL HIGH (ref 70–99)
Potassium: 3.8 mmol/L (ref 3.5–5.1)
Sodium: 137 mmol/L (ref 135–145)
Total Bilirubin: 0.4 mg/dL (ref 0.3–1.2)
Total Protein: 4.2 g/dL — ABNORMAL LOW (ref 6.5–8.1)

## 2021-06-26 LAB — GLUCOSE, CAPILLARY
Glucose-Capillary: 169 mg/dL — ABNORMAL HIGH (ref 70–99)
Glucose-Capillary: 187 mg/dL — ABNORMAL HIGH (ref 70–99)
Glucose-Capillary: 288 mg/dL — ABNORMAL HIGH (ref 70–99)
Glucose-Capillary: 294 mg/dL — ABNORMAL HIGH (ref 70–99)
Glucose-Capillary: 304 mg/dL — ABNORMAL HIGH (ref 70–99)
Glucose-Capillary: 315 mg/dL — ABNORMAL HIGH (ref 70–99)
Glucose-Capillary: 354 mg/dL — ABNORMAL HIGH (ref 70–99)
Glucose-Capillary: 384 mg/dL — ABNORMAL HIGH (ref 70–99)

## 2021-06-26 LAB — ECHOCARDIOGRAM COMPLETE
Area-P 1/2: 2.83 cm2
Height: 65 in
S' Lateral: 2.8 cm
Weight: 3841.3 oz

## 2021-06-26 LAB — MAGNESIUM: Magnesium: 2 mg/dL (ref 1.7–2.4)

## 2021-06-26 LAB — PHOSPHORUS: Phosphorus: 2.9 mg/dL (ref 2.5–4.6)

## 2021-06-26 LAB — TRIGLYCERIDES: Triglycerides: 522 mg/dL — ABNORMAL HIGH (ref <150)

## 2021-06-26 MED ORDER — PIPERACILLIN-TAZOBACTAM 3.375 G IVPB
3.3750 g | Freq: Three times a day (TID) | INTRAVENOUS | Status: DC
  Administered 2021-06-26 – 2021-06-29 (×9): 3.375 g via INTRAVENOUS
  Filled 2021-06-26 (×9): qty 50

## 2021-06-26 MED ORDER — DEXMEDETOMIDINE HCL IN NACL 400 MCG/100ML IV SOLN
0.4000 ug/kg/h | INTRAVENOUS | Status: DC
  Administered 2021-06-26: 0.4 ug/kg/h via INTRAVENOUS
  Administered 2021-06-27 (×4): 1.2 ug/kg/h via INTRAVENOUS
  Administered 2021-06-27: 0.4 ug/kg/h via INTRAVENOUS
  Administered 2021-06-27: 1.2 ug/kg/h via INTRAVENOUS
  Administered 2021-06-28: 1.6 ug/kg/h via INTRAVENOUS
  Administered 2021-06-28: 1.2 ug/kg/h via INTRAVENOUS
  Administered 2021-06-28: 1.6 ug/kg/h via INTRAVENOUS
  Filled 2021-06-26 (×2): qty 100
  Filled 2021-06-26: qty 200
  Filled 2021-06-26 (×6): qty 100

## 2021-06-26 MED ORDER — METHYLPREDNISOLONE SODIUM SUCC 40 MG IJ SOLR
40.0000 mg | Freq: Four times a day (QID) | INTRAMUSCULAR | Status: DC
  Administered 2021-06-26: 40 mg via INTRAVENOUS
  Filled 2021-06-26: qty 1

## 2021-06-26 MED ORDER — BUDESONIDE 0.25 MG/2ML IN SUSP
0.2500 mg | Freq: Two times a day (BID) | RESPIRATORY_TRACT | Status: DC
  Administered 2021-06-26 – 2021-07-19 (×47): 0.25 mg via RESPIRATORY_TRACT
  Filled 2021-06-26 (×47): qty 2

## 2021-06-26 MED ORDER — REVEFENACIN 175 MCG/3ML IN SOLN
175.0000 ug | Freq: Every day | RESPIRATORY_TRACT | Status: DC
Start: 2021-06-26 — End: 2021-08-05
  Administered 2021-06-27 – 2021-08-04 (×36): 175 ug via RESPIRATORY_TRACT
  Filled 2021-06-26 (×40): qty 3

## 2021-06-26 MED ORDER — INSULIN ASPART 100 UNIT/ML IJ SOLN
0.0000 [IU] | INTRAMUSCULAR | Status: DC
  Administered 2021-06-26: 15 [IU] via SUBCUTANEOUS
  Administered 2021-06-26: 20 [IU] via SUBCUTANEOUS
  Administered 2021-06-27 (×2): 11 [IU] via SUBCUTANEOUS
  Administered 2021-06-27: 7 [IU] via SUBCUTANEOUS
  Administered 2021-06-27 (×2): 15 [IU] via SUBCUTANEOUS
  Administered 2021-06-27: 7 [IU] via SUBCUTANEOUS
  Administered 2021-06-28 (×2): 11 [IU] via SUBCUTANEOUS
  Administered 2021-06-28: 7 [IU] via SUBCUTANEOUS
  Administered 2021-06-28: 15 [IU] via SUBCUTANEOUS
  Administered 2021-06-28 (×2): 11 [IU] via SUBCUTANEOUS
  Administered 2021-06-29 (×3): 4 [IU] via SUBCUTANEOUS
  Administered 2021-06-29: 3 [IU] via SUBCUTANEOUS
  Administered 2021-06-29: 4 [IU] via SUBCUTANEOUS
  Administered 2021-06-29: 7 [IU] via SUBCUTANEOUS
  Administered 2021-06-30 (×2): 4 [IU] via SUBCUTANEOUS
  Administered 2021-06-30: 7 [IU] via SUBCUTANEOUS
  Administered 2021-07-01: 4 [IU] via SUBCUTANEOUS
  Administered 2021-07-01: 11 [IU] via SUBCUTANEOUS
  Administered 2021-07-01: 7 [IU] via SUBCUTANEOUS
  Administered 2021-07-01: 4 [IU] via SUBCUTANEOUS
  Administered 2021-07-01: 3 [IU] via SUBCUTANEOUS
  Administered 2021-07-02 (×4): 4 [IU] via SUBCUTANEOUS
  Administered 2021-07-02 – 2021-07-03 (×2): 7 [IU] via SUBCUTANEOUS
  Administered 2021-07-03 (×2): 4 [IU] via SUBCUTANEOUS
  Administered 2021-07-03: 7 [IU] via SUBCUTANEOUS
  Administered 2021-07-03 – 2021-07-04 (×5): 4 [IU] via SUBCUTANEOUS
  Administered 2021-07-04: 7 [IU] via SUBCUTANEOUS
  Administered 2021-07-04: 4 [IU] via SUBCUTANEOUS
  Administered 2021-07-04 – 2021-07-05 (×4): 7 [IU] via SUBCUTANEOUS
  Administered 2021-07-05 (×3): 4 [IU] via SUBCUTANEOUS
  Administered 2021-07-05: 3 [IU] via SUBCUTANEOUS
  Administered 2021-07-06: 4 [IU] via SUBCUTANEOUS
  Administered 2021-07-06: 7 [IU] via SUBCUTANEOUS
  Administered 2021-07-06 (×3): 4 [IU] via SUBCUTANEOUS
  Administered 2021-07-06: 11 [IU] via SUBCUTANEOUS
  Administered 2021-07-07 (×2): 4 [IU] via SUBCUTANEOUS
  Administered 2021-07-07: 7 [IU] via SUBCUTANEOUS
  Administered 2021-07-07 (×2): 4 [IU] via SUBCUTANEOUS
  Administered 2021-07-08: 7 [IU] via SUBCUTANEOUS
  Administered 2021-07-08: 11 [IU] via SUBCUTANEOUS
  Administered 2021-07-08: 4 [IU] via SUBCUTANEOUS
  Administered 2021-07-09: 7 [IU] via SUBCUTANEOUS
  Administered 2021-07-09: 3 [IU] via SUBCUTANEOUS
  Administered 2021-07-09: 4 [IU] via SUBCUTANEOUS
  Administered 2021-07-10: 7 [IU] via SUBCUTANEOUS
  Administered 2021-07-10: 11 [IU] via SUBCUTANEOUS
  Administered 2021-07-10: 3 [IU] via SUBCUTANEOUS
  Administered 2021-07-11: 4 [IU] via SUBCUTANEOUS
  Administered 2021-07-11: 3 [IU] via SUBCUTANEOUS
  Administered 2021-07-11 – 2021-07-12 (×4): 4 [IU] via SUBCUTANEOUS
  Administered 2021-07-13: 11 [IU] via SUBCUTANEOUS
  Administered 2021-07-13: 4 [IU] via SUBCUTANEOUS
  Administered 2021-07-14: 3 [IU] via SUBCUTANEOUS

## 2021-06-26 MED ORDER — ATORVASTATIN CALCIUM 40 MG PO TABS: 40.0000 mg | ORAL_TABLET | Freq: Every day | ORAL | Status: DC

## 2021-06-26 MED ORDER — PANTOPRAZOLE 2 MG/ML SUSPENSION
40.0000 mg | Freq: Every day | ORAL | Status: DC
  Administered 2021-06-26 – 2021-07-24 (×29): 40 mg
  Filled 2021-06-26 (×29): qty 20

## 2021-06-26 MED ORDER — ARFORMOTEROL TARTRATE 15 MCG/2ML IN NEBU
15.0000 ug | INHALATION_SOLUTION | Freq: Two times a day (BID) | RESPIRATORY_TRACT | Status: DC
Start: 2021-06-26 — End: 2021-08-05
  Administered 2021-06-26 – 2021-08-04 (×79): 15 ug via RESPIRATORY_TRACT
  Filled 2021-06-26 (×81): qty 2

## 2021-06-26 MED ORDER — AMIODARONE HCL 200 MG PO TABS
200.0000 mg | ORAL_TABLET | Freq: Every day | ORAL | Status: DC
  Administered 2021-06-26 – 2021-06-28 (×3): 200 mg
  Filled 2021-06-26 (×3): qty 1

## 2021-06-26 MED ORDER — CLONAZEPAM 1 MG PO TABS
1.0000 mg | ORAL_TABLET | Freq: Four times a day (QID) | ORAL | Status: DC
  Administered 2021-06-26 – 2021-07-07 (×42): 1 mg
  Filled 2021-06-26 (×42): qty 1

## 2021-06-26 MED ORDER — ATORVASTATIN CALCIUM 40 MG PO TABS
40.0000 mg | ORAL_TABLET | Freq: Every day | ORAL | Status: DC
  Administered 2021-06-26 – 2021-07-24 (×29): 40 mg
  Filled 2021-06-26 (×29): qty 1

## 2021-06-26 MED ORDER — METHYLPREDNISOLONE SODIUM SUCC 40 MG IJ SOLR
40.0000 mg | Freq: Three times a day (TID) | INTRAMUSCULAR | Status: DC
  Administered 2021-06-26 – 2021-06-28 (×5): 40 mg via INTRAVENOUS
  Filled 2021-06-26 (×6): qty 1

## 2021-06-26 MED ORDER — INSULIN ASPART 100 UNIT/ML IJ SOLN
0.0000 [IU] | INTRAMUSCULAR | Status: DC
  Administered 2021-06-26: 8 [IU] via SUBCUTANEOUS

## 2021-06-26 MED ORDER — ASPIRIN 81 MG PO CHEW
81.0000 mg | CHEWABLE_TABLET | Freq: Every day | ORAL | Status: DC
  Administered 2021-06-26 – 2021-07-24 (×29): 81 mg
  Filled 2021-06-26 (×29): qty 1

## 2021-06-26 MED ORDER — INSULIN DETEMIR 100 UNIT/ML ~~LOC~~ SOLN
10.0000 [IU] | Freq: Two times a day (BID) | SUBCUTANEOUS | Status: DC
  Administered 2021-06-26: 10 [IU] via SUBCUTANEOUS
  Filled 2021-06-26 (×3): qty 0.1

## 2021-06-26 MED ORDER — ALBUTEROL SULFATE (2.5 MG/3ML) 0.083% IN NEBU
2.5000 mg | INHALATION_SOLUTION | RESPIRATORY_TRACT | Status: DC | PRN
  Administered 2021-06-26 – 2021-07-22 (×9): 2.5 mg via RESPIRATORY_TRACT
  Filled 2021-06-26 (×12): qty 3

## 2021-06-26 NOTE — Progress Notes (Signed)
eLink Physician-Brief Progress Note ?Patient Name: Gina Bradley ?DOB: 05-Dec-1960 ?MRN: [id number] ? ? ?Date of Service ? 06/26/2021  ?HPI/Events of Note ? AM ABG reviewed. Multifocal pneumonia ?  ?eICU Interventions ? Stable Type 2 failure. P/F improving ?Continue care  ? ? ? ?Intervention Category ?Intermediate Interventions: Other: (ABG) ? ?Ranee Gosselin ?06/26/2021, 3:01 AM ?

## 2021-06-26 NOTE — Progress Notes (Signed)
? ?NAME:  Gina REAVES, MRN:  [id number], DOB:  1960/04/28, LOS: 4 ?ADMISSION DATE:  06/22/2021, CONSULTATION DATE:  3/21 ?REFERRING MD:  Laural Benes, [facility], CHIEF COMPLAINT:  acute resp failure/ fever wth AMS and SVT ? ?History of Present Illness:  ?61 yo female former smoker presented to [facility] ER on 3/21 with AMS, fever.  Found to have multifocal pneumonia and required intubation and pressors.  Transferred to [facility] for further management. ? ?Pertinent  Medical History  ?Anemia, Back pain, Spinal stenosis COPD, DM type 2, HTN, GERD, CHF, Lymphedema, Neuropathy, SVT ? ?Significant Hospital Events: ?Including procedures, antibiotic start and stop dates in addition to other pertinent events   ?3/21 Admit to [facility], intubated, start pressors, cardiology consult for SVT, start amiodarone ?3/24 transfer to [facility] ? ?Studies:  ?CT head 3/21 >> mild cerebral atrophy, small-vessel disease and atrophic ventriculomegaly with unremarkable cerebellum and brainstem ?CT angio chest 3/21 >> subacute 3rd/4th rib fractures on Rt, Lt anterior subacute rib fx, multifocal pneumonia with ATX ?CT abd/pelvis 3/21 >> endometrium thickening. Remote L4 compression fracture with spurs and displaced inferior endplate causing biforaminal impingement at L4-5. Subacute right sacral insufficiency fracture with fat stranding and callus. Healing right obturator ring ?fractures. Healing left third through fifth lumbar transverse process fractures with callus. Nondisplaced left pubic body fracture. ? ?Interim History / Subjective:  ?Remains on vent, sedation, amiodarone. ? ?Objective   ?Blood pressure 115/61, pulse 75, temperature 100.2 ?F (37.9 ?C), resp. rate (!) 23, height 5\' 1"  (1.549 m), weight 108.9 kg, last menstrual period 01/05/2015, SpO2 93 %. ?CVP:  [20 mmHg] 20 mmHg  ?Vent Mode: PRVC ?FiO2 (%):  [50 %-60 %] 50 % ?Set Rate:  [20 bmp-34 bmp] 20 bmp ?Vt Set:  [460 mL-600 mL] 460 mL ?PEEP:  [8 cmH20] 8 cmH20 ?Plateau Pressure:  [21 cmH20-30 cmH20] 23 cmH20   ? ?Intake/Output Summary (Last 24 hours) at 06/26/2021 0752 ?Last data filed at 06/26/2021 0600 ?Gross per 24 hour  ?Intake 3305.43 ml  ?Output 2850 ml  ?Net 455.43 ml  ? ?Filed Weights  ? 06/24/21 0323 06/25/21 0431 06/26/21 0422  ?Weight: 106.8 kg 108 kg 108.9 kg  ? ?CVP:  [20 mmHg] 20 mmHg  ? ? ?Examination: ? ?General - sedated ?Eyes - pupils reactive ?ENT - ETT in place ?Cardiac - regular rate/rhythm, no murmur ?Chest - b/l rhonchi and wheezing ?Abdomen - soft, non tender, + bowel sounds ?Extremities - 2+ edema ?Skin - areas of ecchymosis ?Neuro - RASS -2 ? ?Resolved Problems:  ?Septic shock, Elevated troponin from demand ischemia ? ?Assessment & Plan:  ? ?Acute on chronic hypoxic/hypercapnic respiratory failure from multifocal bacterial community acquired pneumonia (POA). ?COPD with acute exacerbation. ?- goal SpO2 > 90% ?- f/u CXR ?- pulmicort, brovana, yupelri ?- prn albuterol ?- change to solumedrol 40 mg q8h ? ?Sepsis from pneumonia. ?Blood culture with Staph species (no Staph aureus) in BCID from 3/23. ?- blood culture likely contaminate ?- day 6 of ABx; still having fever >> change Abx to zosyn ?- d/c vancomycin ? ?Paroxysmal SVT. ?Chronic diastolic CHF, HTN, HLD, CAD. ?- unsuccessful cardioversion in ER at [facility]; treated with adenosine and converted to ST ?- seen by cardiology at [facility] ?- transition to enteral amiodarone ?- continue lasix, ASA, lipitor, aldactone ?- hold outpt cozaar, isosorbide ? ?Acute metabolic encephalopathy 2nd to sepsis. ?Hx of peripheral neuropathy, back pain, chronic opiate use. ?- UDS positive for barbiturates ?- RASS goal 0 to -1 ?- hold outpt neurontin, robaxin, oxycodone, norco ?-  change klonopin 1 mg q6h ? ?Anemia of critical illness. ?- f/u CBC ?- transfuse for Hb < 7 ? ?DM type 2 poorly controlled with steroid induced hyperglycemia. ?- SSI ? ?Elevated triglyceride. ?- f/u level; if still high, then d/c diprivan ? ?Endometrial thickening on CT scan. ?- will need outpt f/u with  Gyn ? ?Rt subacute inferior pubic ramus fracture. ?- seen in ED on 06/06/21 and scheduled for outpt orthopedic follow up ? ? ?Best Practice (right click and "Reselect all SmartList Selections" daily)  ?DVT: lovenox ?SUP: protonix ?Diet: tube feeds ?Lines: Rt PICC 3/23 ?Foley: Yes, keep ?Goals of care: Full code ? ?Labs   ? ? ?  Latest Ref Rng & Units 06/26/2021  ?  3:37 AM 06/26/2021  ? 12:36 AM 06/25/2021  ?  5:59 PM  ?CMP  ?Glucose 70 - 99 mg/dL 098      ?BUN 6 - 20 mg/dL 14      ?Creatinine 0.44 - 1.00 mg/dL 1.19      ?Sodium 135 - 145 mmol/L 137   138   138    ?Potassium 3.5 - 5.1 mmol/L 3.8   4.2   3.5    ?Chloride 98 - 111 mmol/L 103      ?CO2 22 - 32 mmol/L 26      ?Calcium 8.9 - 10.3 mg/dL 7.0      ?Total Protein 6.5 - 8.1 g/dL 4.2      ?Total Bilirubin 0.3 - 1.2 mg/dL 0.4      ?Alkaline Phos 38 - 126 U/L 64      ?AST 15 - 41 U/L 48      ?ALT 0 - 44 U/L 48      ? ? ? ?  Latest Ref Rng & Units 06/26/2021  ?  3:37 AM 06/26/2021  ? 12:36 AM 06/25/2021  ?  5:59 PM  ?CBC  ?WBC 4.0 - 10.5 K/uL 10.9      ?Hemoglobin 12.0 - 15.0 g/dL 8.7   9.2   9.9    ?Hematocrit 36.0 - 46.0 % 27.3   27.0   29.0    ?Platelets 150 - 400 K/uL 146      ? ? ?ABG ?   ?Component Value Date/Time  ? PHART 7.322 (L) 06/26/2021 0036  ? PCO2ART 50.3 (H) 06/26/2021 0036  ? PO2ART 61 (L) 06/26/2021 0036  ? HCO3 25.9 06/26/2021 0036  ? TCO2 27 06/26/2021 0036  ? ACIDBASEDEF 0.6 06/24/2021 1855  ? O2SAT 88 06/26/2021 0036  ? ? ?CBG (last 3)  ?Recent Labs  ?  06/25/21 ?2314 06/26/21 ?1478 06/26/21 ?0710  ?GLUCAP 273* 187* 169*  ? ?Critical care time: 43 minutes  ?Coralyn Helling, MD ?[facility] Pulmonary/Critical Care ?Pager - [phone] ?06/26/2021, 8:31 AM ? ?

## 2021-06-26 NOTE — Progress Notes (Signed)
Restraint order was placed for concern with titration off propofol and history of agitation however, patient remained unresponsive and restraints were never required. Notified MD, Dr. Sood & order was discontinued for now. ? ?Will continue to monitor. ? ?Gina Bradley ? ?

## 2021-06-26 NOTE — Progress Notes (Signed)
Pharmacy Antibiotic Note ? ?Gina Bradley is a 61 y.o. female admitted on 06/22/2021 with sepsis.  Pharmacy has been consulted for Zosyn dosing. ? ?Plan: ?Zosyn 3.375g IV q8h (4 hour infusion). ? ?Height: (S) 5\' 5"  (165.1 cm) (pt measured) ?Weight: 108.9 kg (240 lb 1.3 oz) ?IBW/kg (Calculated) : 57 ? ?Temp (24hrs), Avg:100.5 ?F (38.1 ?C), Min:99 ?F (37.2 ?C), Max:102.4 ?F (39.1 ?C) ? ?Recent Labs  ?Lab 06/22/21 ?0302 06/22/21 ?06/24/21 06/23/21 ?0354 06/23/21 ?0801 06/24/21 ?06/26/21 06/25/21 ?06/27/21 06/26/21 ?06/28/21  ?WBC 24.4*  --   --  12.6* 11.0* 6.4 10.9*  ?CREATININE 1.28*  --  0.76  --  0.76 0.73 0.76  ?LATICACIDVEN 6.0* 1.4  --   --   --   --   --   ?  ?Estimated Creatinine Clearance: 91.8 mL/min (by C-G formula based on SCr of 0.76 mg/dL).   ? ?Allergies  ?Allergen Reactions  ? Bee Venom Other (See Comments)  ?  Unknown  ? Tdap [Tetanus-Diphth-Acell Pertussis] Other (See Comments)  ?  Unknown  ? ? ?Antimicrobials this admission: ?Ceftriaxone 3/21 >>3/25 ?Doxycycline 3/22 >> 3/25 ?Vancomycin 3/24 x1 dose ?Zosyn 3/25 >> ? ?Dose adjustments this admission: ? ? ?Microbiology results: ?3/23 BCx: 1/4 Staph hominis (likely contaminate  ?3/23 UCx: 1K staph aureus ?3/23 MRSA PCR: negative ? ?Thank you for allowing pharmacy to be a part of this patient?s care. ? ?4/23, PharmD ?Please refer to [facility] for [facility] numbers ?06/26/2021 2:00 PM ? ?

## 2021-06-26 NOTE — Progress Notes (Signed)
eLink Physician-Brief Progress Note ?Patient Name: Gina Bradley ?DOB: Mar 17, 1961 ?MRN: [id number] ? ? ?Date of Service ? 06/26/2021  ?HPI/Events of Note ? Agitation - Patient has been off Propofol since earlier today d/t triglyceride level = 522. Already on Fentanyl IV infusion at 400 mcg/hour.  ?eICU Interventions ? Plan: ?Prededex IV infusion. Titrate to RASS = 0 to -1.   ? ? ? ?Intervention Category ?Major Interventions: Delirium, psychosis, severe agitation - evaluation and management ? ?Gina Bradley ?06/26/2021, 8:31 PM ?

## 2021-06-26 NOTE — Progress Notes (Signed)
? ?Progress Note ? ?Patient Name: Gina Bradley ?Date of Encounter: 06/26/2021 ? ?[facility] Cardiologist: Rozann Lesches, MD  ? ?Subjective  ? ?Remains intubated and sedated - now maintaining sinus rhythm on amiodarone. Noted to have hypertriglyceridemia and steroid related hyperglycemia on SSI. ? ?Inpatient Medications  ?  ?Scheduled Meds: ? amiodarone  200 mg Per Tube Daily  ? arformoterol  15 mcg Nebulization BID  ? aspirin  81 mg Per Tube Daily  ? atorvastatin  40 mg Per Tube Daily  ? budesonide (PULMICORT) nebulizer solution  0.25 mg Nebulization BID  ? chlorhexidine gluconate (MEDLINE KIT)  15 mL Mouth Rinse BID  ? Chlorhexidine Gluconate Cloth  6 each Topical Q0600  ? clonazePAM  1 mg Per Tube Q6H  ? docusate  100 mg Per Tube BID  ? enoxaparin (LOVENOX) injection  40 mg Subcutaneous Q24H  ? furosemide  40 mg Intravenous BID  ? insulin aspart  0-9 Units Subcutaneous Q4H  ? mouth rinse  15 mL Mouth Rinse 10 times per day  ? methylPREDNISolone (SOLU-MEDROL) injection  40 mg Intravenous Q6H  ? pantoprazole sodium  40 mg Per Tube Daily  ? polyethylene glycol  17 g Per Tube Daily  ? revefenacin  175 mcg Nebulization Daily  ? sodium chloride flush  10-40 mL Intracatheter Q12H  ? spironolactone  50 mg Per Tube BID  ? ?Continuous Infusions: ? sodium chloride 10 mL/hr at 06/26/21 0800  ? feeding supplement (VITAL 1.5 CAL) 1,000 mL (06/25/21 1754)  ? fentaNYL infusion INTRAVENOUS 225 mcg/hr (06/26/21 0800)  ? propofol (DIPRIVAN) infusion 20 mcg/kg/min (06/26/21 0800)  ? ?PRN Meds: ?sodium chloride, acetaminophen, albuterol, docusate, fentaNYL, ondansetron (ZOFRAN) IV, polyethylene glycol, sodium chloride flush  ? ?Vital Signs  ?  ?Vitals:  ? 06/26/21 0700 06/26/21 0734 06/26/21 0737 06/26/21 0800  ?BP: 115/61   (!) 114/57  ?Pulse: 76  75 84  ?Resp: (!) 22  (!) 23 (!) 27  ?Temp: (!) 100.4 ?F (38 ?C)  100.2 ?F (37.9 ?C) 100 ?F (37.8 ?C)  ?TempSrc:      ?SpO2: 97% 95% 93% 91%  ?Weight:      ?Height:       ? ? ?Intake/Output Summary (Last 24 hours) at 06/26/2021 0941 ?Last data filed at 06/26/2021 0800 ?Gross per 24 hour  ?Intake 3609.47 ml  ?Output 2610 ml  ?Net 999.47 ml  ? ? ?  06/26/2021  ?  4:22 AM 06/25/2021  ?  4:31 AM 06/24/2021  ?  3:23 AM  ?Last 3 Weights  ?Weight (lbs) 240 lb 1.3 oz 238 lb 1.6 oz 235 lb 7.2 oz  ?Weight (kg) 108.9 kg 108 kg 106.8 kg  ?   ? ?Telemetry  ?  ?NSR, HR in 80's to 90's. No significant arrhythmias.  - Personally Reviewed ? ?ECG  ? ?No new tracings.  ? ?Physical Exam  ? ?GEN: Currently intubated and sedated.  ?Neck: +JVD  ?Cardiac: RRR, no murmurs, rubs, or gallops.  ?Respiratory: Decreased breath sounds along bases and scattered rhonchi. ?GI: Soft, nontender, non-distended  ?MS: 1+ lower extremity edema bilaterally; No deformity. ?Psych: Unable to be assessed. Intubated and sedated.  ? ?Labs  ?  ?High Sensitivity Troponin:   ?Recent Labs  ?Lab 06/22/21 ?0352 06/22/21 ?0737 06/24/21 ?1062 06/24/21 ?6948  ?TROPONINIHS 94* 218* 26* 23*  ?   ?Chemistry ?Recent Labs  ?Lab 06/24/21 ?0357 06/25/21 ?0306 06/25/21 ?1759 06/26/21 ?0036 06/26/21 ?0337  ?NA 136 138 138 138 137  ?K 3.9  3.0* 3.5 4.2 3.8  ?CL 104 104  --   --  103  ?CO2 23 24  --   --  26  ?GLUCOSE 207* 259*  --   --  285*  ?BUN 15 14  --   --  14  ?CREATININE 0.76 0.73  --   --  0.76  ?CALCIUM 8.2* 7.4*  --   --  7.0*  ?MG 1.9 1.7  --   --  2.0  ?PROT 5.4* 4.6*  --   --  4.2*  ?ALBUMIN 2.4* 1.9*  --   --  1.6*  ?AST 83* 46*  --   --  48*  ?ALT 100* 60*  --   --  48*  ?ALKPHOS 92 68  --   --  64  ?BILITOT 0.2* 0.3  --   --  0.4  ?GFRNONAA >60 >60  --   --  >60  ?ANIONGAP 9 10  --   --  8  ?  ?Lipids  ?Recent Labs  ?Lab 06/26/21 ?0325  ?TRIG 522*  ?  ?Hematology ?Recent Labs  ?Lab 06/24/21 ?0357 06/25/21 ?0306 06/25/21 ?1759 06/26/21 ?0036 06/26/21 ?0337  ?WBC 11.0* 6.4  --   --  10.9*  ?RBC 3.38* 2.80*  --   --  2.77*  ?HGB 10.3* 8.5* 9.9* 9.2* 8.7*  ?HCT 33.5* 27.2* 29.0* 27.0* 27.3*  ?MCV 99.1 97.1  --   --  98.6  ?MCH 30.5 30.4  --    --  31.4  ?MCHC 30.7 31.3  --   --  31.9  ?RDW 15.6* 15.8*  --   --  16.4*  ?PLT 168 143*  --   --  146*  ? ?Thyroid No results for input(s): TSH, FREET4 in the last 168 hours.  ?BNP ?Recent Labs  ?Lab 06/23/21 ?0354 06/24/21 ?4920 06/25/21 ?0306  ?BNP 117.0* 93.0 89.0  ?  ?DDimer No results for input(s): DDIMER in the last 168 hours.  ? ?Radiology  ?  ?DG Chest Port 1 View ? ?Result Date: 06/25/2021 ?CLINICAL DATA:  Altered mental status. Acute respiratory failure. Endotracheal tube present. EXAM: PORTABLE CHEST 1 VIEW COMPARISON:  Prior today FINDINGS: Endotracheal tube orogastric tube, and right arm PICC line remain in appropriate position. No pneumothorax visualized. Bilateral symmetric pulmonary airspace disease shows no significant change allowing for technical differences. No significant pleural effusion visualized. IMPRESSION: No significant change in symmetric bilateral pulmonary airspace disease. Electronically Signed   By: Marlaine Hind M.D.   On: 06/25/2021 16:51  ? ?DG CHEST PORT 1 VIEW ? ?Result Date: 06/25/2021 ?CLINICAL DATA:  Follow-up bilateral lung opacities, ventilator dependent respiratory failure. EXAM: PORTABLE CHEST 1 VIEW COMPARISON:  Portable chest yesterday 4:37 a.m. FINDINGS: 5:20 a.m., 06/25/2021. ETT tip is 5.9 cm from the carina, previously 3.7 cm. NGT tip is well inside the stomach, pointing cephalad directed left. Right PICC interval insertion with tip in the distal SVC. The heart enlarged but the central vessels are normal caliber on today's exam. There is persistent patchy dense consolidation of the left-greater-than-right lower lung fields, with scattered ground-glass infiltrates in the upper lung fields primarily peripherally. There are persistent minimal pleural effusions which are unchanged. There is no new or worsening lung opacity. Stable mediastinum with aortic atherosclerosis. Stable thoracic cage. IMPRESSION: No interval change in the bilateral lung opacities and minimal  pleural effusions. Perihilar vascular congestion is not seen on today's exam. Support tubes adequately inserted with new right PICC tip in the distal SVC.  No pneumothorax. Electronically Signed   By: Telford Nab M.D.   On: 06/25/2021 07:25  ? ?[place] EKG SITE RITE ? ?Result Date: 06/24/2021 ?If [employer] not attached, placement could not be confirmed due to current cardiac rhythm. ? ?US Abdomen Limited RUQ (LIVER/GB) ? ?Result Date: 06/24/2021 ?CLINICAL DATA:  Elevated liver enzymes EXAM: ULTRASOUND ABDOMEN LIMITED RIGHT UPPER QUADRANT COMPARISON:  CT 06/22/2021, ultrasound 08/29/2013 FINDINGS: Gallbladder: No gallstones or wall thickening visualized. No sonographic Murphy sign noted by sonographer. Common bile duct: Diameter: 2.3 mm Liver: No focal lesion identified. Within normal limits in parenchymal echogenicity. Portal vein is patent on color Doppler imaging with normal direction of blood flow towards the liver. Other: None. IMPRESSION: Unremarkable right upper quadrant ultrasound. Electronically Signed   By: Maurine Simmering M.D.   On: 06/24/2021 15:25   ? ?Cardiac Studies  ? ?Echocardiogram: 08/18/2020 ?IMPRESSIONS  ? ? ? 1. Left ventricular ejection fraction, by estimation, is 70 to 75%. The  ?left ventricle has hyperdynamic function. The left ventricle has no  ?regional wall motion abnormalities. Left ventricular diastolic parameters  ?are consistent with Grade I diastolic  ?dysfunction (impaired relaxation).  ? 2. Right ventricular systolic function is normal. The right ventricular  ?size is normal.  ? 3. The mitral valve is normal in structure. No evidence of mitral valve  ?regurgitation. No evidence of mitral stenosis.  ? 4. The aortic valve is normal in structure. Aortic valve regurgitation is  ?not visualized. No aortic stenosis is present.  ? 5. The inferior vena cava is normal in size with greater than 50%  ?respiratory variability, suggesting right atrial pressure of 3 mmHg.  ? ?Patient Profile  ?    ?61 y.o. female w/ PMH of PSVT, Type 2 DM, HTN, HLD, lymphedema and COPD who is currently admitted with acute hypoxic respiratory failure in the setting of PNA. Cardiology consulted due to SVT.  ? ?Assessment & Pl

## 2021-06-26 NOTE — Progress Notes (Signed)
?  Echocardiogram ?2D Echocardiogram has been performed. ? ?Delcie Roch ?06/26/2021, 5:46 PM ?

## 2021-06-27 ENCOUNTER — Inpatient Hospital Stay (HOSPITAL_COMMUNITY): Payer: 59

## 2021-06-27 DIAGNOSIS — G9341 Metabolic encephalopathy: Secondary | ICD-10-CM

## 2021-06-27 DIAGNOSIS — J9602 Acute respiratory failure with hypercapnia: Secondary | ICD-10-CM | POA: Diagnosis not present

## 2021-06-27 DIAGNOSIS — J9601 Acute respiratory failure with hypoxia: Secondary | ICD-10-CM | POA: Diagnosis not present

## 2021-06-27 DIAGNOSIS — A419 Sepsis, unspecified organism: Secondary | ICD-10-CM | POA: Diagnosis not present

## 2021-06-27 DIAGNOSIS — I5032 Chronic diastolic (congestive) heart failure: Secondary | ICD-10-CM | POA: Diagnosis not present

## 2021-06-27 LAB — URINE CULTURE: Culture: 1000 — AB

## 2021-06-27 LAB — POCT I-STAT 7, (LYTES, BLD GAS, ICA,H+H)
Acid-Base Excess: 6 mmol/L — ABNORMAL HIGH (ref 0.0–2.0)
Bicarbonate: 31 mmol/L — ABNORMAL HIGH (ref 20.0–28.0)
Calcium, Ion: 1.2 mmol/L (ref 1.15–1.40)
HCT: 27 % — ABNORMAL LOW (ref 36.0–46.0)
Hemoglobin: 9.2 g/dL — ABNORMAL LOW (ref 12.0–15.0)
O2 Saturation: 92 %
Patient temperature: 37.1
Potassium: 3.8 mmol/L (ref 3.5–5.1)
Sodium: 142 mmol/L (ref 135–145)
TCO2: 32 mmol/L (ref 22–32)
pCO2 arterial: 48.6 mmHg — ABNORMAL HIGH (ref 32–48)
pH, Arterial: 7.413 (ref 7.35–7.45)
pO2, Arterial: 63 mmHg — ABNORMAL LOW (ref 83–108)

## 2021-06-27 LAB — COMPREHENSIVE METABOLIC PANEL
ALT: 47 U/L — ABNORMAL HIGH (ref 0–44)
AST: 51 U/L — ABNORMAL HIGH (ref 15–41)
Albumin: 1.7 g/dL — ABNORMAL LOW (ref 3.5–5.0)
Alkaline Phosphatase: 74 U/L (ref 38–126)
Anion gap: 7 (ref 5–15)
BUN: 19 mg/dL (ref 6–20)
CO2: 29 mmol/L (ref 22–32)
Calcium: 8 mg/dL — ABNORMAL LOW (ref 8.9–10.3)
Chloride: 107 mmol/L (ref 98–111)
Creatinine, Ser: 0.73 mg/dL (ref 0.44–1.00)
GFR, Estimated: >60 mL/min (ref >60)
Glucose, Bld: 308 mg/dL — ABNORMAL HIGH (ref 70–99)
Potassium: 3.6 mmol/L (ref 3.5–5.1)
Sodium: 143 mmol/L (ref 135–145)
Total Bilirubin: 0.3 mg/dL (ref 0.3–1.2)
Total Protein: 4.9 g/dL — ABNORMAL LOW (ref 6.5–8.1)

## 2021-06-27 LAB — CULTURE, BLOOD (ROUTINE X 2)

## 2021-06-27 LAB — CBC
HCT: 29.3 % — ABNORMAL LOW (ref 36.0–46.0)
Hemoglobin: 9.4 g/dL — ABNORMAL LOW (ref 12.0–15.0)
MCH: 31.5 pg (ref 26.0–34.0)
MCHC: 32.1 g/dL (ref 30.0–36.0)
MCV: 98.3 fL (ref 80.0–100.0)
Platelets: 176 10*3/uL (ref 150–400)
RBC: 2.98 MIL/uL — ABNORMAL LOW (ref 3.87–5.11)
RDW: 16.1 % — ABNORMAL HIGH (ref 11.5–15.5)
WBC: 8.9 10*3/uL (ref 4.0–10.5)
nRBC: 0.8 % — ABNORMAL HIGH (ref 0.0–0.2)

## 2021-06-27 LAB — GLUCOSE, CAPILLARY
Glucose-Capillary: 334 mg/dL — ABNORMAL HIGH (ref 70–99)
Glucose-Capillary: 334 mg/dL — ABNORMAL HIGH (ref 70–99)
Glucose-Capillary: 222 mg/dL — ABNORMAL HIGH (ref 70–99)
Glucose-Capillary: 263 mg/dL — ABNORMAL HIGH (ref 70–99)
Glucose-Capillary: 247 mg/dL — ABNORMAL HIGH (ref 70–99)

## 2021-06-27 LAB — TRIGLYCERIDES: Triglycerides: 185 mg/dL — ABNORMAL HIGH (ref <150)

## 2021-06-27 MED ORDER — HYDRALAZINE HCL 20 MG/ML IJ SOLN
10.0000 mg | INTRAMUSCULAR | Status: DC | PRN
  Administered 2021-06-27 – 2021-06-30 (×7): 10 mg via INTRAVENOUS
  Filled 2021-06-27 (×8): qty 1

## 2021-06-27 MED ORDER — POTASSIUM CHLORIDE 20 MEQ PO PACK
40.0000 meq | PACK | Freq: Once | ORAL | Status: AC
  Administered 2021-06-27: 40 meq
  Filled 2021-06-27: qty 2

## 2021-06-27 MED ORDER — INSULIN ASPART 100 UNIT/ML IJ SOLN: 2.0000 [IU] | INTRAMUSCULAR | Status: DC

## 2021-06-27 MED ORDER — ENOXAPARIN SODIUM 60 MG/0.6ML IJ SOSY
55.0000 mg | PREFILLED_SYRINGE | INTRAMUSCULAR | Status: DC
  Administered 2021-06-28: 55 mg via SUBCUTANEOUS
  Filled 2021-06-27: qty 0.6

## 2021-06-27 MED ORDER — LOSARTAN POTASSIUM 50 MG PO TABS
25.0000 mg | ORAL_TABLET | Freq: Every day | ORAL | Status: DC
  Administered 2021-06-27: 25 mg
  Filled 2021-06-27 (×2): qty 1

## 2021-06-27 MED ORDER — LOSARTAN POTASSIUM 50 MG PO TABS: 25.0000 mg | ORAL_TABLET | Freq: Every day | ORAL | Status: DC

## 2021-06-27 MED ORDER — MIDAZOLAM HCL 2 MG/2ML IJ SOLN
2.0000 mg | INTRAMUSCULAR | Status: DC | PRN
  Administered 2021-06-27 – 2021-07-08 (×14): 2 mg via INTRAVENOUS
  Filled 2021-06-27 (×15): qty 2

## 2021-06-27 MED ORDER — OXYCODONE HCL 5 MG PO TABS
10.0000 mg | ORAL_TABLET | ORAL | Status: DC
  Administered 2021-06-27 – 2021-07-06 (×47): 10 mg
  Filled 2021-06-27 (×48): qty 2

## 2021-06-27 MED ORDER — QUETIAPINE FUMARATE 25 MG PO TABS
25.0000 mg | ORAL_TABLET | Freq: Two times a day (BID) | ORAL | Status: DC
  Administered 2021-06-27 (×2): 25 mg
  Filled 2021-06-27 (×3): qty 1

## 2021-06-27 MED ORDER — MIDAZOLAM HCL 2 MG/2ML IJ SOLN
INTRAMUSCULAR | Status: AC
  Filled 2021-06-27: qty 2

## 2021-06-27 MED ORDER — POTASSIUM CHLORIDE 10 MEQ/100ML IV SOLN: 10.0000 meq | INTRAVENOUS | Status: DC

## 2021-06-27 MED ORDER — MIDAZOLAM HCL 2 MG/2ML IJ SOLN
INTRAMUSCULAR | Status: AC
  Administered 2021-06-27: 2 mg via INTRAVENOUS
  Filled 2021-06-27: qty 2

## 2021-06-27 MED ORDER — INSULIN ASPART 100 UNIT/ML IJ SOLN
2.0000 [IU] | INTRAMUSCULAR | Status: DC
  Administered 2021-06-27 – 2021-06-28 (×6): 2 [IU] via SUBCUTANEOUS

## 2021-06-27 MED ORDER — INSULIN DETEMIR 100 UNIT/ML ~~LOC~~ SOLN
15.0000 [IU] | Freq: Two times a day (BID) | SUBCUTANEOUS | Status: DC
  Administered 2021-06-27 (×2): 15 [IU] via SUBCUTANEOUS
  Filled 2021-06-27 (×4): qty 0.15

## 2021-06-27 MED ORDER — MIDAZOLAM HCL 2 MG/2ML IJ SOLN: 2.0000 mg | INTRAMUSCULAR | Status: DC | PRN

## 2021-06-27 NOTE — Progress Notes (Signed)
[facility] ADULT ICU REPLACEMENT PROTOCOL ? ? ?The patient does apply for the [facility] Adult ICU Electrolyte Replacment Protocol based on the criteria listed below:  ? ?1.Exclusion criteria: TCTS patients, ECMO patients, and Dialysis patients ?2. Is GFR >/= 30 ml/min? Yes.    ?Patient's GFR today is >60 ?3. Is SCr </= 2? Yes.   ?Patient's SCr is 0.73 mg/dL ?4. Did SCr increase >/= 0.5 in 24 hours? No. ?5.Pt's weight >40kg  Yes.   ?6. Abnormal electrolyte(s): K+3.6  ?7. Electrolytes replaced per protocol ?8.  Call MD STAT for K+ </= 2.5, Phos </= 1, or Mag </= 1 ?Physician:  Arsenio Loader ? ?Lolita Lenz 06/27/2021 4:36 AM  ?

## 2021-06-27 NOTE — Progress Notes (Signed)
? ?NAME:  Gina Bradley, MRN:  [id number], DOB:  1960-09-24, LOS: 5 ?ADMISSION DATE:  06/22/2021, CONSULTATION DATE:  3/21 ?REFERRING MD:  Wynetta Emery, [facility], CHIEF COMPLAINT:  acute resp failure/ fever wth AMS and SVT ? ?History of Present Illness:  ?61 yo female former smoker presented to [facility] ER on 3/21 with AMS, fever.  Found to have multifocal pneumonia and required intubation and pressors.  Transferred to [facility] for further management. ? ?Pertinent  Medical History  ?Anemia, Back pain, Spinal stenosis COPD, DM type 2, HTN, GERD, CHF, Lymphedema, Neuropathy, SVT ? ?Significant Hospital Events: ?Including procedures, antibiotic start and stop dates in addition to other pertinent events   ?3/21 Admit to [facility], intubated, start pressors, cardiology consult for SVT, start amiodarone ?3/24 transfer to [facility] ? ?Studies:  ?CT head 3/21 >> mild cerebral atrophy, small-vessel disease and atrophic ventriculomegaly with unremarkable cerebellum and brainstem ?CT angio chest 3/21 >> subacute 3rd/4th rib fractures on Rt, Lt anterior subacute rib fx, multifocal pneumonia with ATX ?CT abd/pelvis 3/21 >> endometrium thickening. Remote L4 compression fracture with spurs and displaced inferior endplate causing biforaminal impingement at L4-5. Subacute right sacral insufficiency fracture with fat stranding and callus. Healing right obturator ring ?fractures. Healing left third through fifth lumbar transverse process fractures with callus. Nondisplaced left pubic body fracture. ? ?Interim History / Subjective:  ? ?Patient was agitated and fighting the vent. Not following commands.  ? ?Afebrile ?SBP 170s ?Off of Propofol for triglyceride of 500. Started Fentanyl ?0.6 Precedex, 325 Fentanyl ?ABG: 7.4/48.6/63 ?CBG 300s ?Objective   ?Blood pressure (!) 171/98, pulse 67, temperature 99 ?F (37.2 ?C), resp. rate 15, height (S) 5\' 5"  (1.651 m), weight 107.6 kg, last menstrual period 01/05/2015, SpO2 99 %. ?   ?Vent Mode: PRVC ?FiO2 (%):  [40 %-50 %]  40 % ?Set Rate:  [20 bmp] 20 bmp ?Vt Set:  [460 mL] 460 mL ?PEEP:  [8 cmH20] 8 cmH20 ?Plateau Pressure:  [27 cmH20-34 cmH20] 34 cmH20  ? ?Intake/Output Summary (Last 24 hours) at 06/27/2021 0742 ?Last data filed at 06/27/2021 0700 ?Gross per 24 hour  ?Intake 2482.01 ml  ?Output 3010 ml  ?Net -527.99 ml  ? ? ?Filed Weights  ? 06/25/21 0431 06/26/21 0422 06/27/21 0345  ?Weight: 108 kg 108.9 kg 107.6 kg  ? ?   ?Examination: ? ?General - agitated, hypoxia ?Eyes - pupils reactive ?ENT - ETT in place ?Cardiac - tachycardiac, hypertensive ?Chest - clear to bilat auscultation  ?Abdomen - soft, non tender, + bowel sounds ?Extremities - 1+ edema ?Skin - areas of scattered ecchymosis ?Neuro - agitated, not follow commands ? ?Resolved Problems:  ?Septic shock, Elevated troponin from demand ischemia ? ?Assessment & Plan:  ? ?Acute on chronic hypoxic/hypercapnic respiratory failure from multifocal bacterial community acquired pneumonia (POA). ?COPD with acute exacerbation. ?- goal SpO2 > 90% ?- CXR-improvement of bilat infiltrates ?- pulmicort, brovana, yupelri ?- prn albuterol ?- cont solumedrol 40 mg q8h (d2). Can transition to PO if continue to improve ? ?Sepsis from pneumonia. ?Blood culture with Staph species (no Staph aureus) in [facility] from 3/23. ?- blood culture likely contaminate ?- Urine culture grew Staph aureus. UA however negative for nitrite and leukocytes repeatedly.  Patient was on 3 days of Doxy and 1 day of Vanc, should be adequately covered. Fever curve and WBC trending down.  ?- Continue Zosyn for PNA for now ? ?Paroxysmal SVT. ?Acute on chronic diastolic CHF, exacerbated by SVT ?HTN, HLD, CAD. ?- unsuccessful cardioversion in ER at  [facility]; treated with adenosine and converted to ST. Current NSR.  ?- seen by cardiology at [facility] ?- Continue enteral amiodarone ?- continue lasix 40 mg BID. Good urine output. Net - 500 cc. ?- Continue ASA, lipitor, aldactone ?- hold outpt isosorbide. Can resume Losartan if BP remains  consistently elevated ? ?Acute metabolic encephalopathy 2nd to sepsis. ?Hx of peripheral neuropathy, back pain, chronic opiate use. ?- UDS positive for barbiturates ?- Stop Propofol for hypertriglyceridemia ?- Cont Fentanyl and Precedex. Added Seroquel. Cont Klonopin. - RASS goal 0 to -1 ?- if patient gets agitated with titrate down Fentanyl, will add PO opioids.  ?- Versed 2 mg Q4h PRN ?- hold outpt neurontin, robaxin, oxycodone, norco ? ?Anemia of critical illness. ?- f/u CBC ?- transfuse for Hb < 7 ? ?DM type 2 poorly controlled with steroid induced hyperglycemia. ?CBG in the 300s ?- Increase Levemir to 15 units BID ?- SSI ? ?Elevated triglyceride. ?- f/u level; if still high, then d/c diprivan ?- Dc propofol ? ?Endometrial thickening on CT scan. ?- will need outpt f/u with Gyn ? ?Rt subacute inferior pubic ramus fracture. ?- seen in ED on 06/06/21 and scheduled for outpt orthopedic follow up ? ? ?Best Practice (right click and "Reselect all SmartList Selections" daily)  ?DVT: lovenox ?SUP: protonix ?Diet: tube feeds ?Lines: Rt PICC 3/23 ?Foley: will pull today ?Goals of care: Full code ? ?Labs   ? ? ?  Latest Ref Rng & Units 06/27/2021  ?  3:32 AM 06/27/2021  ?  3:27 AM 06/26/2021  ?  3:37 AM  ?CMP  ?Glucose 70 - 99 mg/dL 308    285    ?BUN 6 - 20 mg/dL 19    14    ?Creatinine 0.44 - 1.00 mg/dL 0.73    0.76    ?Sodium 135 - 145 mmol/L 143   142   137    ?Potassium 3.5 - 5.1 mmol/L 3.6   3.8   3.8    ?Chloride 98 - 111 mmol/L 107    103    ?CO2 22 - 32 mmol/L 29    26    ?Calcium 8.9 - 10.3 mg/dL 8.0    7.0    ?Total Protein 6.5 - 8.1 g/dL 4.9    4.2    ?Total Bilirubin 0.3 - 1.2 mg/dL 0.3    0.4    ?Alkaline Phos 38 - 126 U/L 74    64    ?AST 15 - 41 U/L 51    48    ?ALT 0 - 44 U/L 47    48    ? ? ? ?  Latest Ref Rng & Units 06/27/2021  ?  3:32 AM 06/27/2021  ?  3:27 AM 06/26/2021  ?  3:37 AM  ?CBC  ?WBC 4.0 - 10.5 K/uL 8.9    10.9    ?Hemoglobin 12.0 - 15.0 g/dL 9.4   9.2   8.7    ?Hematocrit 36.0 - 46.0 % 29.3   27.0    27.3    ?Platelets 150 - 400 K/uL 176    146    ? ? ?ABG ?   ?Component Value Date/Time  ? PHART 7.413 06/27/2021 0327  ? PCO2ART 48.6 (H) 06/27/2021 0327  ? PO2ART 63 (L) 06/27/2021 0327  ? HCO3 31.0 (H) 06/27/2021 0327  ? TCO2 32 06/27/2021 0327  ? ACIDBASEDEF 0.6 06/24/2021 1855  ? O2SAT 92 06/27/2021 0327  ? ? ?CBG (last 3)  ?Recent Labs  ?  06/26/21 ?2332 06/27/21 ?0302 06/27/21 ?[id number]  ?GLUCAP 294* 334* 334*  ? ? ?Critical care time:   ? ?Gaylan Gerold, DO ?

## 2021-06-27 NOTE — Progress Notes (Signed)
eLink Physician-Brief Progress Note ?Patient Name: Gina Bradley ?DOB: 10-Apr-1960 ?MRN: [id number] ? ? ?Date of Service ? 06/27/2021  ?HPI/Events of Note ? Hypertension - BP = 172/102.  ?eICU Interventions ? Plan: ?Hydralazine 10 mg IV Q 4 hours PRN SBP > 160 or DBP > 100.  ? ? ? ?Intervention Category ?Major Interventions: Hypertension - evaluation and management ? ?Gina Bradley ?06/27/2021, 6:17 AM ?

## 2021-06-27 NOTE — Progress Notes (Signed)
? ?Progress Note ? ?Patient Name: Gina Bradley ?Date of Encounter: 06/27/2021 ? ?[facility] Cardiologist: Rozann Lesches, MD  ? ?Subjective  ? ?Remains intubated and sedated - maintaining sinus rhythm. BP elevated today- taken off propofol - trigs significantly improved to 185 today. Net negative 1.3L overnight. LVEF 65-70% by echo yesterday. ? ?Inpatient Medications  ?  ?Scheduled Meds: ? amiodarone  200 mg Per Tube Daily  ? arformoterol  15 mcg Nebulization BID  ? aspirin  81 mg Per Tube Daily  ? atorvastatin  40 mg Per Tube Daily  ? budesonide (PULMICORT) nebulizer solution  0.25 mg Nebulization BID  ? chlorhexidine gluconate (MEDLINE KIT)  15 mL Mouth Rinse BID  ? Chlorhexidine Gluconate Cloth  6 each Topical Q0600  ? clonazePAM  1 mg Per Tube Q6H  ? docusate  100 mg Per Tube BID  ? enoxaparin (LOVENOX) injection  40 mg Subcutaneous Q24H  ? furosemide  40 mg Intravenous BID  ? insulin aspart  0-20 Units Subcutaneous Q4H  ? insulin aspart  2 Units Subcutaneous Q4H  ? insulin detemir  15 Units Subcutaneous BID  ? mouth rinse  15 mL Mouth Rinse 10 times per day  ? methylPREDNISolone (SOLU-MEDROL) injection  40 mg Intravenous Q8H  ? pantoprazole sodium  40 mg Per Tube Daily  ? polyethylene glycol  17 g Per Tube Daily  ? QUEtiapine  25 mg Per Tube BID  ? revefenacin  175 mcg Nebulization Daily  ? sodium chloride flush  10-40 mL Intracatheter Q12H  ? spironolactone  50 mg Per Tube BID  ? ?Continuous Infusions: ? sodium chloride Stopped (06/26/21 1525)  ? dexmedetomidine (PRECEDEX) IV infusion 1.2 mcg/kg/hr (06/27/21 0900)  ? feeding supplement (VITAL 1.5 CAL) 1,000 mL (06/26/21 1939)  ? fentaNYL infusion INTRAVENOUS 350 mcg/hr (06/27/21 0900)  ? piperacillin-tazobactam (ZOSYN)  IV 12.5 mL/hr at 06/27/21 0900  ? ?PRN Meds: ?sodium chloride, acetaminophen, albuterol, docusate, fentaNYL, hydrALAZINE, midazolam, ondansetron (ZOFRAN) IV, polyethylene glycol, sodium chloride flush  ? ?Vital Signs  ?  ?Vitals:  ?  06/27/21 0830 06/27/21 0845 06/27/21 0900 06/27/21 0915  ?BP: (!) 160/86 (!) 163/89 (!) 171/96 (!) 180/100  ?Pulse: 94 83 80 78  ?Resp: 15 16 14 16   ?Temp: 100.2 ?F (37.9 ?C) 100 ?F (37.8 ?C) 100 ?F (37.8 ?C) 99.9 ?F (37.7 ?C)  ?TempSrc:      ?SpO2: 93% 91% 93% 94%  ?Weight:      ?Height:      ? ? ?Intake/Output Summary (Last 24 hours) at 06/27/2021 0942 ?Last data filed at 06/27/2021 0900 ?Gross per 24 hour  ?Intake 2311.78 ml  ?Output 3685 ml  ?Net -1373.22 ml  ? ? ?  06/27/2021  ?  3:45 AM 06/26/2021  ?  4:22 AM 06/25/2021  ?  4:31 AM  ?Last 3 Weights  ?Weight (lbs) 237 lb 3.4 oz 240 lb 1.3 oz 238 lb 1.6 oz  ?Weight (kg) 107.6 kg 108.9 kg 108 kg  ?   ? ?Telemetry  ?  ?NSR, HR in 80's to 90's. No significant arrhythmias.  - Personally Reviewed ? ?ECG  ? ?No new tracings.  ? ?Physical Exam  ? ?GEN: Currently intubated and sedated.  ?Neck: +JVD  ?Cardiac: RRR, no murmurs, rubs, or gallops.  ?Respiratory: Decreased breath sounds along bases and scattered rhonchi. ?GI: Soft, nontender, non-distended  ?MS: 1+ lower extremity edema bilaterally; No deformity. ?Psych: Unable to be assessed. Intubated and sedated.  ? ?Labs  ?  ?High Sensitivity  Troponin:   ?Recent Labs  ?Lab 06/22/21 ?0352 06/22/21 ?1517 06/24/21 ?6160 06/24/21 ?7371  ?TROPONINIHS 94* 218* 26* 23*  ?   ?Chemistry ?Recent Labs  ?Lab 06/24/21 ?0357 06/25/21 ?0306 06/25/21 ?1759 06/26/21 ?0626 06/27/21 ?0327 06/27/21 ?9485  ?NA 136 138   < > 137 142 143  ?K 3.9 3.0*   < > 3.8 3.8 3.6  ?CL 104 104  --  103  --  107  ?CO2 23 24  --  26  --  29  ?GLUCOSE 207* 259*  --  285*  --  308*  ?BUN 15 14  --  14  --  19  ?CREATININE 0.76 0.73  --  0.76  --  0.73  ?CALCIUM 8.2* 7.4*  --  7.0*  --  8.0*  ?MG 1.9 1.7  --  2.0  --   --   ?PROT 5.4* 4.6*  --  4.2*  --  4.9*  ?ALBUMIN 2.4* 1.9*  --  1.6*  --  1.7*  ?AST 83* 46*  --  48*  --  51*  ?ALT 100* 60*  --  48*  --  47*  ?ALKPHOS 92 68  --  64  --  74  ?BILITOT 0.2* 0.3  --  0.4  --  0.3  ?GFRNONAA >60 >60  --  >60  --  >60   ?ANIONGAP 9 10  --  8  --  7  ? < > = values in this interval not displayed.  ?  ?Lipids  ?Recent Labs  ?Lab 06/27/21 ?0333  ?TRIG 185*  ?  ?Hematology ?Recent Labs  ?Lab 06/25/21 ?0306 06/25/21 ?1759 06/26/21 ?4627 06/27/21 ?0327 06/27/21 ?0350  ?WBC 6.4  --  10.9*  --  8.9  ?RBC 2.80*  --  2.77*  --  2.98*  ?HGB 8.5*   < > 8.7* 9.2* 9.4*  ?HCT 27.2*   < > 27.3* 27.0* 29.3*  ?MCV 97.1  --  98.6  --  98.3  ?MCH 30.4  --  31.4  --  31.5  ?MCHC 31.3  --  31.9  --  32.1  ?RDW 15.8*  --  16.4*  --  16.1*  ?PLT 143*  --  146*  --  176  ? < > = values in this interval not displayed.  ? ?Thyroid No results for input(s): TSH, FREET4 in the last 168 hours.  ?BNP ?Recent Labs  ?Lab 06/23/21 ?0354 06/24/21 ?0938 06/25/21 ?0306  ?BNP 117.0* 93.0 89.0  ?  ?DDimer No results for input(s): DDIMER in the last 168 hours.  ? ?Radiology  ?  ?DG Chest Port 1 View ? ?Result Date: 06/27/2021 ?CLINICAL DATA:  Altered mental status.  Follow-up exam. EXAM: PORTABLE CHEST 1 VIEW COMPARISON:  06/25/2021 and older exams. FINDINGS: Stable cardiomegaly. Interstitial and patchy airspace lung opacities, predominantly in the lower lungs, are without significant change from the most recent prior exam. No new lung abnormalities. No pneumothorax. Endotracheal tube, right PICC and nasal/orogastric tube are stable and well positioned. IMPRESSION: 1. No significant change from the most recent prior study. 2. Persistent bilateral interstitial and airspace opacities, predominating in the lung bases. 3. Stable well-positioned support apparatus. Electronically Signed   By: Lajean Manes M.D.   On: 06/27/2021 08:12  ? ?DG Chest Port 1 View ? ?Result Date: 06/25/2021 ?CLINICAL DATA:  Altered mental status. Acute respiratory failure. Endotracheal tube present. EXAM: PORTABLE CHEST 1 VIEW COMPARISON:  Prior today FINDINGS: Endotracheal tube orogastric tube, and  right arm PICC line remain in appropriate position. No pneumothorax visualized. Bilateral symmetric  pulmonary airspace disease shows no significant change allowing for technical differences. No significant pleural effusion visualized. IMPRESSION: No significant change in symmetric bilateral pulmonary airspace disease. Electronically Signed   By: Marlaine Hind M.D.   On: 06/25/2021 16:51  ? ?ECHOCARDIOGRAM COMPLETE ? ?Result Date: 06/26/2021 ?   ECHOCARDIOGRAM REPORT   Patient Name:   Gina Bradley Date of Exam: 06/26/2021 Medical Rec #:  [id number]       Height:       65.0 in Accession #:    [id number]      Weight:       240.1 lb Date of Birth:  [birth year]       BSA:          2.138 m? Patient Age:    89 years        BP:           126/60 mmHg Patient Gender: F               HR:           83 bpm. Exam Location:  Inpatient Procedure: 2D Echo Indications:    acute diastolic chf  History:        Patient has prior history of Echocardiogram examinations, most                 recent 08/18/2020. Sepsis; Risk Factors:Hypertension,                 Dyslipidemia and Diabetes.  Sonographer:    Johny Chess RDCS Referring Phys: [id number] MICHAEL B WERT  Sonographer Comments: Echo performed with patient supine and on artificial respirator. Image acquisition challenging due to respiratory motion and Image acquisition challenging due to patient body habitus. IMPRESSIONS  1. Left ventricular ejection fraction, by estimation, is 65 to 70%. The left ventricle has normal function. The left ventricle has no regional wall motion abnormalities. Left ventricular diastolic parameters are indeterminate.  2. Right ventricular systolic function is normal. The right ventricular size is normal. Tricuspid regurgitation signal is inadequate for assessing PA pressure.  3. The mitral valve is normal in structure. No evidence of mitral valve regurgitation.  4. The aortic valve was not well visualized. Aortic valve regurgitation is not visualized. No aortic stenosis is present. FINDINGS  Left Ventricle: Left ventricular ejection fraction, by estimation,  is 65 to 70%. The left ventricle has normal function. The left ventricle has no regional wall motion abnormalities. The left ventricular internal cavity size was normal in size. There is  no left ventricula

## 2021-06-28 ENCOUNTER — Inpatient Hospital Stay (HOSPITAL_COMMUNITY): Payer: 59

## 2021-06-28 DIAGNOSIS — J9601 Acute respiratory failure with hypoxia: Secondary | ICD-10-CM | POA: Diagnosis not present

## 2021-06-28 DIAGNOSIS — J9602 Acute respiratory failure with hypercapnia: Secondary | ICD-10-CM | POA: Diagnosis not present

## 2021-06-28 LAB — BASIC METABOLIC PANEL
Anion gap: 8 (ref 5–15)
BUN: 28 mg/dL — ABNORMAL HIGH (ref 6–20)
CO2: 29 mmol/L (ref 22–32)
Calcium: 8 mg/dL — ABNORMAL LOW (ref 8.9–10.3)
Chloride: 108 mmol/L (ref 98–111)
Creatinine, Ser: 0.69 mg/dL (ref 0.44–1.00)
GFR, Estimated: >60 mL/min (ref >60)
Glucose, Bld: 296 mg/dL — ABNORMAL HIGH (ref 70–99)
Potassium: 4.1 mmol/L (ref 3.5–5.1)
Sodium: 145 mmol/L (ref 135–145)

## 2021-06-28 LAB — CBC
HCT: 29.5 % — ABNORMAL LOW (ref 36.0–46.0)
Hemoglobin: 9.5 g/dL — ABNORMAL LOW (ref 12.0–15.0)
MCH: 31.7 pg (ref 26.0–34.0)
MCHC: 32.2 g/dL (ref 30.0–36.0)
MCV: 98.3 fL (ref 80.0–100.0)
Platelets: 196 10*3/uL (ref 150–400)
RBC: 3 MIL/uL — ABNORMAL LOW (ref 3.87–5.11)
RDW: 16 % — ABNORMAL HIGH (ref 11.5–15.5)
WBC: 8.5 10*3/uL (ref 4.0–10.5)
nRBC: 1.3 % — ABNORMAL HIGH (ref 0.0–0.2)

## 2021-06-28 LAB — POCT I-STAT 7, (LYTES, BLD GAS, ICA,H+H)
Acid-Base Excess: 7 mmol/L — ABNORMAL HIGH (ref 0.0–2.0)
Bicarbonate: 31.1 mmol/L — ABNORMAL HIGH (ref 20.0–28.0)
Calcium, Ion: 1.2 mmol/L (ref 1.15–1.40)
HCT: 29 % — ABNORMAL LOW (ref 36.0–46.0)
Hemoglobin: 9.9 g/dL — ABNORMAL LOW (ref 12.0–15.0)
O2 Saturation: 96 %
Patient temperature: 97.4
Potassium: 4.5 mmol/L (ref 3.5–5.1)
Sodium: 144 mmol/L (ref 135–145)
TCO2: 32 mmol/L (ref 22–32)
pCO2 arterial: 42.5 mmHg (ref 32–48)
pH, Arterial: 7.47 — ABNORMAL HIGH (ref 7.35–7.45)
pO2, Arterial: 74 mmHg — ABNORMAL LOW (ref 83–108)

## 2021-06-28 LAB — GLUCOSE, CAPILLARY
Glucose-Capillary: 238 mg/dL — ABNORMAL HIGH (ref 70–99)
Glucose-Capillary: 242 mg/dL — ABNORMAL HIGH (ref 70–99)
Glucose-Capillary: 263 mg/dL — ABNORMAL HIGH (ref 70–99)
Glucose-Capillary: 265 mg/dL — ABNORMAL HIGH (ref 70–99)
Glucose-Capillary: 275 mg/dL — ABNORMAL HIGH (ref 70–99)
Glucose-Capillary: 278 mg/dL — ABNORMAL HIGH (ref 70–99)
Glucose-Capillary: 303 mg/dL — ABNORMAL HIGH (ref 70–99)

## 2021-06-28 LAB — CULTURE, BLOOD (ROUTINE X 2)
Culture: NO GROWTH
Special Requests: ADEQUATE

## 2021-06-28 MED ORDER — AMIODARONE HCL 200 MG PO TABS
200.0000 mg | ORAL_TABLET | Freq: Two times a day (BID) | ORAL | Status: DC
  Administered 2021-06-28 – 2021-07-01 (×5): 200 mg
  Filled 2021-06-28 (×5): qty 1

## 2021-06-28 MED ORDER — PREDNISONE 20 MG PO TABS
40.0000 mg | ORAL_TABLET | Freq: Every day | ORAL | Status: DC
  Administered 2021-06-28: 40 mg
  Filled 2021-06-28 (×2): qty 2

## 2021-06-28 MED ORDER — PREDNISONE 20 MG PO TABS: 40.0000 mg | ORAL_TABLET | Freq: Every day | ORAL | Status: DC

## 2021-06-28 MED ORDER — METOPROLOL TARTRATE 25 MG PO TABS: 25.0000 mg | ORAL_TABLET | Freq: Two times a day (BID) | ORAL | Status: DC

## 2021-06-28 MED ORDER — FUROSEMIDE 10 MG/ML IJ SOLN
40.0000 mg | Freq: Three times a day (TID) | INTRAMUSCULAR | Status: DC
Start: 2021-06-28 — End: 2021-06-29
  Administered 2021-06-28 (×3): 40 mg via INTRAVENOUS
  Filled 2021-06-28 (×2): qty 4

## 2021-06-28 MED ORDER — MIDAZOLAM HCL 2 MG/2ML IJ SOLN
INTRAMUSCULAR | Status: AC
  Filled 2021-06-28: qty 4

## 2021-06-28 MED ORDER — FUROSEMIDE 10 MG/ML IJ SOLN
40.0000 mg | Freq: Once | INTRAMUSCULAR | Status: AC
  Administered 2021-06-28: 40 mg via INTRAVENOUS
  Filled 2021-06-28: qty 4

## 2021-06-28 MED ORDER — PROSOURCE TF PO LIQD
45.0000 mL | Freq: Four times a day (QID) | ORAL | Status: DC
  Administered 2021-06-28 – 2021-07-20 (×86): 45 mL
  Filled 2021-06-28 (×86): qty 45

## 2021-06-28 MED ORDER — PREDNISONE 10 MG PO TABS: 10.0000 mg | ORAL_TABLET | Freq: Every day | ORAL | Status: DC

## 2021-06-28 MED ORDER — INSULIN DETEMIR 100 UNIT/ML ~~LOC~~ SOLN
20.0000 [IU] | Freq: Two times a day (BID) | SUBCUTANEOUS | Status: DC
  Administered 2021-06-28 (×2): 20 [IU] via SUBCUTANEOUS
  Filled 2021-06-28 (×4): qty 0.2

## 2021-06-28 MED ORDER — PREDNISONE 10 MG PO TABS
10.0000 mg | ORAL_TABLET | Freq: Every day | ORAL | Status: DC
Start: 2021-06-30 — End: 2021-06-28

## 2021-06-28 MED ORDER — ENOXAPARIN SODIUM 60 MG/0.6ML IJ SOSY: 55.0000 mg | PREFILLED_SYRINGE | INTRAMUSCULAR | Status: DC

## 2021-06-28 MED ORDER — INSULIN ASPART 100 UNIT/ML IJ SOLN
3.0000 [IU] | INTRAMUSCULAR | Status: DC
  Administered 2021-06-28 – 2021-06-29 (×6): 3 [IU] via SUBCUTANEOUS

## 2021-06-28 MED ORDER — MIDAZOLAM HCL 2 MG/2ML IJ SOLN
4.0000 mg | Freq: Once | INTRAMUSCULAR | Status: AC
  Administered 2021-06-28: 4 mg via INTRAVENOUS

## 2021-06-28 MED ORDER — QUETIAPINE FUMARATE 50 MG PO TABS
50.0000 mg | ORAL_TABLET | Freq: Two times a day (BID) | ORAL | Status: DC
  Administered 2021-06-28 (×2): 50 mg
  Filled 2021-06-28 (×2): qty 1

## 2021-06-28 MED ORDER — PROPOFOL 1000 MG/100ML IV EMUL
5.0000 ug/kg/min | INTRAVENOUS | Status: DC
  Administered 2021-06-28 (×2): 40 ug/kg/min via INTRAVENOUS
  Administered 2021-06-28: 50 ug/kg/min via INTRAVENOUS
  Administered 2021-06-28: 45 ug/kg/min via INTRAVENOUS
  Administered 2021-06-28: 40 ug/kg/min via INTRAVENOUS
  Administered 2021-06-29: 55 ug/kg/min via INTRAVENOUS
  Administered 2021-06-29: 35 ug/kg/min via INTRAVENOUS
  Administered 2021-06-29 (×2): 55 ug/kg/min via INTRAVENOUS
  Administered 2021-06-29: 40 ug/kg/min via INTRAVENOUS
  Administered 2021-06-29: 35 ug/kg/min via INTRAVENOUS
  Administered 2021-06-29: 55 ug/kg/min via INTRAVENOUS
  Administered 2021-06-30: 40 ug/kg/min via INTRAVENOUS
  Administered 2021-06-30: 10 ug/kg/min via INTRAVENOUS
  Administered 2021-06-30: 20 ug/kg/min via INTRAVENOUS
  Administered 2021-06-30: 35 ug/kg/min via INTRAVENOUS
  Administered 2021-07-01: 20 ug/kg/min via INTRAVENOUS
  Administered 2021-07-01: 25 ug/kg/min via INTRAVENOUS
  Filled 2021-06-28: qty 100
  Filled 2021-06-28: qty 200
  Filled 2021-06-28 (×12): qty 100
  Filled 2021-06-28: qty 200
  Filled 2021-06-28: qty 100

## 2021-06-28 MED ORDER — LOSARTAN POTASSIUM 50 MG PO TABS
50.0000 mg | ORAL_TABLET | Freq: Every day | ORAL | Status: DC
  Administered 2021-06-28: 50 mg
  Filled 2021-06-28: qty 1

## 2021-06-28 NOTE — Progress Notes (Signed)
eLink Physician-Brief Progress Note ?Patient Name: Gina Bradley ?DOB: 06/17/1960 ?MRN: [id number] ? ? ?Date of Service ? 06/28/2021  ?HPI/Events of Note ? ABG on 40%/PRVC 20/TV 460/P 8 = 7.47/42.5/74/31.1  ?eICU Interventions ? Continue present management.  ? ? ? ?Intervention Category ?Major Interventions: Respiratory failure - evaluation and management ? ?[name] ?06/28/2021, 6:01 AM ?

## 2021-06-28 NOTE — Progress Notes (Addendum)
? ?Progress Note ? ?Patient Name: Gina Bradley ?Date of Encounter: 06/28/2021 ? ?[facility] Cardiologist: Rozann Lesches, MD  ? ?Subjective  ? ?Remains intubated and sedated. Per telemetry, patient is maintaining sinus rhythm. BP elevated overnight, 113/54 this AM.  ?Patient was tachycardic this AM, discussed with RN and patient was very agitated at that time.  ? ?Inpatient Medications  ?  ?Scheduled Meds: ? amiodarone  200 mg Per Tube Daily  ? arformoterol  15 mcg Nebulization BID  ? aspirin  81 mg Per Tube Daily  ? atorvastatin  40 mg Per Tube Daily  ? budesonide (PULMICORT) nebulizer solution  0.25 mg Nebulization BID  ? chlorhexidine gluconate (MEDLINE KIT)  15 mL Mouth Rinse BID  ? Chlorhexidine Gluconate Cloth  6 each Topical Q0600  ? clonazePAM  1 mg Per Tube Q6H  ? docusate  100 mg Per Tube BID  ? furosemide  40 mg Intravenous TID  ? insulin aspart  0-20 Units Subcutaneous Q4H  ? insulin aspart  3 Units Subcutaneous Q4H  ? insulin detemir  20 Units Subcutaneous BID  ? losartan  50 mg Per Tube Daily  ? mouth rinse  15 mL Mouth Rinse 10 times per day  ? oxyCODONE  10 mg Per Tube Q4H  ? pantoprazole sodium  40 mg Per Tube Daily  ? polyethylene glycol  17 g Per Tube Daily  ? predniSONE  40 mg Per Tube Q breakfast  ? Followed by  ? [START ON 06/30/2021] predniSONE  10 mg Per Tube Q breakfast  ? QUEtiapine  50 mg Per Tube BID  ? revefenacin  175 mcg Nebulization Daily  ? sodium chloride flush  10-40 mL Intracatheter Q12H  ? spironolactone  50 mg Per Tube BID  ? ?Continuous Infusions: ? sodium chloride Stopped (06/26/21 1525)  ? feeding supplement (VITAL 1.5 CAL) 1,000 mL (06/27/21 1735)  ? fentaNYL infusion INTRAVENOUS 400 mcg/hr (06/28/21 0700)  ? piperacillin-tazobactam (ZOSYN)  IV 12.5 mL/hr at 06/28/21 0700  ? propofol (DIPRIVAN) infusion 40 mcg/kg/min (06/28/21 0807)  ? ?PRN Meds: ?sodium chloride, acetaminophen, albuterol, docusate, fentaNYL, hydrALAZINE, midazolam, ondansetron (ZOFRAN) IV,  polyethylene glycol, sodium chloride flush  ? ?Vital Signs  ?  ?Vitals:  ? 06/28/21 1000 06/28/21 1030 06/28/21 1100 06/28/21 1119  ?BP: (!) 116/53 (!) 115/56 (!) 113/54 (!) 113/54  ?Pulse: 75 72 70 69  ?Resp: 19 18 18  (!) 21  ?Temp:      ?TempSrc:      ?SpO2: 97% 97% 97% 98%  ?Weight:      ?Height:      ? ? ?Intake/Output Summary (Last 24 hours) at 06/28/2021 1124 ?Last data filed at 06/28/2021 6415 ?Gross per 24 hour  ?Intake 1987.06 ml  ?Output 2894 ml  ?Net -906.94 ml  ? ? ?  06/28/2021  ?  2:42 AM 06/27/2021  ?  3:45 AM 06/26/2021  ?  4:22 AM  ?Last 3 Weights  ?Weight (lbs) 235 lb 10.8 oz 237 lb 3.4 oz 240 lb 1.3 oz  ?Weight (kg) 106.9 kg 107.6 kg 108.9 kg  ?   ? ?Telemetry  ?  ?Sinus rhythm, HR variable with episodes of tachycardia to the 130s- Personally Reviewed ? ?ECG  ? ?No new tracings.  ? ?Physical Exam  ? ?GEN: Currently intubated and sedated.  ?Neck: +JVD  ?Cardiac: RRR, no murmurs, rubs, or gallops.  ?Respiratory: Rhonchi heard throughout, coarse breath sounds ?GI: Soft, non-distended  ?MS: 2+ lower extremity edema bilaterally; No deformity. ?Psych:  Unable to be assessed. Intubated and sedated.  ? ?Labs  ?  ?High Sensitivity Troponin:   ?Recent Labs  ?Lab 06/22/21 ?0352 06/22/21 ?4098 06/24/21 ?1191 06/24/21 ?4782  ?TROPONINIHS 94* 218* 26* 23*  ?   ?Chemistry ?Recent Labs  ?Lab 06/24/21 ?0357 06/25/21 ?0306 06/25/21 ?1759 06/26/21 ?9562 06/27/21 ?0327 06/27/21 ?1308 06/28/21 ?0406 06/28/21 ?6578  ?NA 136 138   < > 137   < > 143 145 144  ?K 3.9 3.0*   < > 3.8   < > 3.6 4.1 4.5  ?CL 104 104  --  103  --  107 108  --   ?CO2 23 24  --  26  --  29 29  --   ?GLUCOSE 207* 259*  --  285*  --  308* 296*  --   ?BUN 15 14  --  14  --  19 28*  --   ?CREATININE 0.76 0.73  --  0.76  --  0.73 0.69  --   ?CALCIUM 8.2* 7.4*  --  7.0*  --  8.0* 8.0*  --   ?MG 1.9 1.7  --  2.0  --   --   --   --   ?PROT 5.4* 4.6*  --  4.2*  --  4.9*  --   --   ?ALBUMIN 2.4* 1.9*  --  1.6*  --  1.7*  --   --   ?AST 83* 46*  --  48*  --  51*   --   --   ?ALT 100* 60*  --  48*  --  47*  --   --   ?ALKPHOS 92 68  --  64  --  74  --   --   ?BILITOT 0.2* 0.3  --  0.4  --  0.3  --   --   ?GFRNONAA >60 >60  --  >60  --  >60 >60  --   ?ANIONGAP 9 10  --  8  --  7 8  --   ? < > = values in this interval not displayed.  ?  ?Lipids  ?Recent Labs  ?Lab 06/27/21 ?0333  ?TRIG 185*  ?  ?Hematology ?Recent Labs  ?Lab 06/26/21 ?4696 06/27/21 ?0327 06/27/21 ?2952 06/28/21 ?0406 06/28/21 ?8413  ?WBC 10.9*  --  8.9 8.5  --   ?RBC 2.77*  --  2.98* 3.00*  --   ?HGB 8.7*   < > 9.4* 9.5* 9.9*  ?HCT 27.3*   < > 29.3* 29.5* 29.0*  ?MCV 98.6  --  98.3 98.3  --   ?MCH 31.4  --  31.5 31.7  --   ?MCHC 31.9  --  32.1 32.2  --   ?RDW 16.4*  --  16.1* 16.0*  --   ?PLT 146*  --  176 196  --   ? < > = values in this interval not displayed.  ? ?Thyroid No results for input(s): TSH, FREET4 in the last 168 hours.  ?BNP ?Recent Labs  ?Lab 06/23/21 ?0354 06/24/21 ?2440 06/25/21 ?0306  ?BNP 117.0* 93.0 89.0  ?  ?DDimer No results for input(s): DDIMER in the last 168 hours.  ? ?Radiology  ?  ?DG CHEST PORT 1 VIEW ? ?Result Date: 06/28/2021 ?CLINICAL DATA:  Ventilator dependent respiratory failure pneumonia. EXAM: PORTABLE CHEST 1 VIEW COMPARISON:  Portable chest yesterday at 4:52 a.m. FINDINGS: 1:11 a.m., 06/28/2021. Right PICC tip is at the superior cavoatrial junction. ETT tip is 6.5 cm  from the carina. Enteric tube is well inside the stomach with the tube extending to the left and turning cephalad with tip in the proximal fundus. The heart is enlarged. Stable mediastinum with aortic atherosclerosis. Small pleural effusions and mild central vascular prominence. Interstitial and patchy airspace opacities of the mid to lower lung fields progressively dense towards the bases are again shown. Today there may be slight improvement in the densest infiltrates in the lung bases. No new or worsening lung infiltrate is seen or other interval changes. IMPRESSION: 1. There appears to be a slight  improvement in most dense consolidative opacities in the lung bases. No other changes in the overall aeration. 2. Stable cardiomegaly, small pleural effusions. 3. Adequately inserted support devices. Electronically Signed   By: Telford Nab M.D.   On: 06/28/2021 01:28  ? ?DG Chest Port 1 View ? ?Result Date: 06/27/2021 ?CLINICAL DATA:  Altered mental status.  Follow-up exam. EXAM: PORTABLE CHEST 1 VIEW COMPARISON:  06/25/2021 and older exams. FINDINGS: Stable cardiomegaly. Interstitial and patchy airspace lung opacities, predominantly in the lower lungs, are without significant change from the most recent prior exam. No new lung abnormalities. No pneumothorax. Endotracheal tube, right PICC and nasal/orogastric tube are stable and well positioned. IMPRESSION: 1. No significant change from the most recent prior study. 2. Persistent bilateral interstitial and airspace opacities, predominating in the lung bases. 3. Stable well-positioned support apparatus. Electronically Signed   By: Lajean Manes M.D.   On: 06/27/2021 08:12  ? ?ECHOCARDIOGRAM COMPLETE ? ?Result Date: 06/26/2021 ?   ECHOCARDIOGRAM REPORT   Patient Name:   Gina Bradley Date of Exam: 06/26/2021 Medical Rec #:  [id number]       Height:       65.0 in Accession #:    [id number]      Weight:       240.1 lb Date of Birth:  09-19-1960       BSA:          2.138 m? Patient Age:    61 years        BP:           126/60 mmHg Patient Gender: F               HR:           83 bpm. Exam Location:  Inpatient Procedure: 2D Echo Indications:    acute diastolic chf  History:        Patient has prior history of Echocardiogram examinations, most                 recent 08/18/2020. Sepsis; Risk Factors:Hypertension,                 Dyslipidemia and Diabetes.  Sonographer:    Johny Chess RDCS Referring Phys: [id number] MICHAEL B WERT  Sonographer Comments: Echo performed with patient supine and on artificial respirator. Image acquisition challenging due to respiratory motion and  Image acquisition challenging due to patient body habitus. IMPRESSIONS  1. Left ventricular ejection fraction, by estimation, is 65 to 70%. The left ventricle has normal function. The left ventricle has no regional wall

## 2021-06-28 NOTE — Progress Notes (Signed)
eLink Physician-Brief Progress Note ?Patient Name: Gina Bradley ?DOB: 1960/05/06 ?MRN: [id number] ? ? ?Date of Service ? 06/28/2021  ?HPI/Events of Note ? Nursing reports ventilator asynchrony (double stacking). Just finished bath and patient has audible sound like cuff leak. Video assessment - I do not hear a cuff leak.   ?eICU Interventions ? Plan: ?Portable CXR STAT. ?Increase ceiling on Precedex IV infusion to 1.6 mcg/kg/hour.   ? ? ? ?Intervention Category ?Major Interventions: Other: ? ?Epic Tribbett Cornelia Copa ?06/28/2021, 1:06 AM ?

## 2021-06-28 NOTE — Progress Notes (Signed)
eLink Physician-Brief Progress Note ?Patient Name: Gina Bradley ?DOB: Apr 04, 1961 ?MRN: [id number] ? ? ?Date of Service ? 06/28/2021  ?HPI/Events of Note ? Ventilator asynchrony (occasional double stacking). CXR looks wet. Already on Lasix BID. I/O still 6 liter positive since admission.   ?eICU Interventions ? Plan: ?Lasix 40 mg IV X 1 now (extra dose). ?ABG now.  ? ? ? ?Intervention Category ?Major Interventions: Respiratory failure - evaluation and management;Hypoxemia - evaluation and management ? ?Rhaelyn Giron Dennard Nip ?06/28/2021, 4:02 AM ?

## 2021-06-28 NOTE — Progress Notes (Signed)
? ?NAME:  Gina Bradley, MRN:  [id number], DOB:  [birth year], LOS: 6 ?ADMISSION DATE:  06/22/2021, CONSULTATION DATE:  3/21 ?REFERRING MD:  Wynetta Emery, [facility], CHIEF COMPLAINT:  acute resp failure/ fever wth AMS and SVT ? ?History of Present Illness:  ?61 yo female former smoker presented to [facility] ER on 3/21 with AMS, fever.  Found to have multifocal pneumonia and required intubation and pressors.  Transferred to [facility] for further management. ? ?Pertinent  Medical History  ?Anemia, Back pain, Spinal stenosis COPD, DM type 2, HTN, GERD, CHF, Lymphedema, Neuropathy, SVT ? ?Significant Hospital Events: ?Including procedures, antibiotic start and stop dates in addition to other pertinent events   ?3/21 Admit to [facility], intubated, start pressors, cardiology consult for SVT, start amiodarone ?3/24 transfer to [facility] ? ?Studies:  ?CT head 3/21 >> mild cerebral atrophy, small-vessel disease and atrophic ventriculomegaly with unremarkable cerebellum and brainstem ?CT angio chest 3/21 >> subacute 3rd/4th rib fractures on Rt, Lt anterior subacute rib fx, multifocal pneumonia with ATX ?CT abd/pelvis 3/21 >> endometrium thickening. Remote L4 compression fracture with spurs and displaced inferior endplate causing biforaminal impingement at L4-5. Subacute right sacral insufficiency fracture with fat stranding and callus. Healing right obturator ring ?fractures. Healing left third through fifth lumbar transverse process fractures with callus. Nondisplaced left pubic body fracture. ? ?Interim History / Subjective:  ? ?Patient was agitated and fighting the vent. Not following commands.  ? ?Ven dyssynchrony. CXR looks wet. Got an extra dose of Lasix ? ?Objective   ?Blood pressure (!) 174/98, pulse (!) 126, temperature (!) 97.4 ?F (36.3 ?C), temperature source Axillary, resp. rate (!) 39, height (S) 5\' 5"  (1.651 m), weight 106.9 kg, last menstrual period 01/05/2015, SpO2 95 %. ?   ?Vent Mode: PRVC ?FiO2 (%):  [40 %] 40 % ?Set Rate:  [20 bmp] 20  bmp ?Vt Set:  [460 mL] 460 mL ?PEEP:  [8 cmH20] 8 cmH20 ?Plateau Pressure:  [19 cmH20-24 cmH20] 19 cmH20  ? ?Intake/Output Summary (Last 24 hours) at 06/28/2021 0742 ?Last data filed at 06/28/2021 [id number] ?Gross per 24 hour  ?Intake 2425.49 ml  ?Output 3019 ml  ?Net -593.51 ml  ? ? ?Filed Weights  ? 06/26/21 0422 06/27/21 0345 06/28/21 0242  ?Weight: 108.9 kg 107.6 kg 106.9 kg  ? ?   ?Examination: ? ?General - agitated, not cooperate with the vent  ?Eyes - pupils reactive ?ENT - ETT in place ?Cardiac - tachycardic ?Chest - rhonchi heard bilat ?Abdomen - soft, non tender, + bowel sounds ?Extremities - 1+ edema ?Skin - areas of scattered ecchymosis ?Neuro - agitated, not follow commands ? ?Resolved Problems:  ?Septic shock, Elevated troponin from demand ischemia ? ?Assessment & Plan:  ?Acute on chronic hypoxic/hypercapnic respiratory failure from multifocal bacterial community acquired pneumonia (POA). ?COPD with acute exacerbation. ?- goal SpO2 > 90% ?- pulmicort, brovana, yupelri ?- prn albuterol ?- Transition to Prednisone 40 mg for 2 more days and back to home does Prednisone 10 mg.  ?- Still not able to follow commands. Likely to stay on vent for long time. May need a trach.  ? ?Community acquired pneumonia vs aspiration PNA. ?- blood culture likely contaminate ?- Urine culture grew Staph aureus. UA however negative for nitrite and leukocytes repeatedly.  Patient was on 3 days of Doxy and 1 day of Vanc, should be adequately covered.  ?- Fever curve and WBC trending down.  ?- Continue Zosyn for PNA for now (day 8). Repeat respiratory culture ? ?Paroxysmal SVT. ?Acute on  chronic diastolic CHF, exacerbated by SVT ?HTN, HLD, CAD ?- Continue enteral amiodarone. Can be transition to BB. Defer to cardiology ?- Increase Lasix to 40 mg TID. Still net positive 6L ?- Continue ASA, lipitor, losartan and aldactone ?- hold outpt isosorbide.  ? ?Acute metabolic encephalopath ?Hx of peripheral neuropathy, back pain, chronic opiate  use. ?- UDS positive for barbiturates ?- Cont Fentanyl and start Propofol.  ?- PO Klonopin, seroquel and Oxycodone ?- RASS goal 0 to -1 ?- Versed 2 mg Q2h PRN ?- hold outpt neurontin, robaxin, oxycodone, norco ? ?Anemia of critical illness. ?- f/u CBC ?- transfuse for Hb < 7 ? ?DM type 2 poorly controlled with steroid induced hyperglycemia. ?- Increase Levemir to 20 units BID ?- Tube feed coverage Novolog 3 unit Q4H ?- SSI ? ?Endometrial thickening on CT scan. ?- will need outpt f/u with Gyn ? ?Rt subacute inferior pubic ramus fracture. ?- seen in ED on 06/06/21 and scheduled for outpt orthopedic follow up ? ?Best Practice (right click and "Reselect all SmartList Selections" daily)  ?DVT: lovenox ?SUP: protonix ?Diet: tube feeds ?Lines: Rt PICC 3/23 ?Foley: Purewick ?Goals of care: Full code ? ?Labs   ? ? ?  Latest Ref Rng & Units 06/28/2021  ?  4:57 AM 06/28/2021  ?  4:06 AM 06/27/2021  ?  3:32 AM  ?CMP  ?Glucose 70 - 99 mg/dL  296   308    ?BUN 6 - 20 mg/dL  28   19    ?Creatinine 0.44 - 1.00 mg/dL  0.69   0.73    ?Sodium 135 - 145 mmol/L 144   145   143    ?Potassium 3.5 - 5.1 mmol/L 4.5   4.1   3.6    ?Chloride 98 - 111 mmol/L  108   107    ?CO2 22 - 32 mmol/L  29   29    ?Calcium 8.9 - 10.3 mg/dL  8.0   8.0    ?Total Protein 6.5 - 8.1 g/dL   4.9    ?Total Bilirubin 0.3 - 1.2 mg/dL   0.3    ?Alkaline Phos 38 - 126 U/L   74    ?AST 15 - 41 U/L   51    ?ALT 0 - 44 U/L   47    ? ? ? ?  Latest Ref Rng & Units 06/28/2021  ?  4:57 AM 06/28/2021  ?  4:06 AM 06/27/2021  ?  3:32 AM  ?CBC  ?WBC 4.0 - 10.5 K/uL  8.5   8.9    ?Hemoglobin 12.0 - 15.0 g/dL 9.9   9.5   9.4    ?Hematocrit 36.0 - 46.0 % 29.0   29.5   29.3    ?Platelets 150 - 400 K/uL  196   176    ? ? ?ABG ?   ?Component Value Date/Time  ? PHART 7.470 (H) 06/28/2021 0457  ? PCO2ART 42.5 06/28/2021 0457  ? PO2ART 74 (L) 06/28/2021 0457  ? HCO3 31.1 (H) 06/28/2021 0457  ? TCO2 32 06/28/2021 0457  ? ACIDBASEDEF 0.6 06/24/2021 1855  ? O2SAT 96 06/28/2021 0457  ? ? ?CBG  (last 3)  ?Recent Labs  ?  06/27/21 ?2012 06/28/21 ?0053 06/28/21 ?0416  ?GLUCAP 247* 275* 303*  ? ? ?Critical care time:   ? ?Gaylan Gerold, DO ?

## 2021-06-28 NOTE — Progress Notes (Signed)
Nutrition Follow-up ? ?DOCUMENTATION CODES:  ? ?Obesity unspecified ? ?INTERVENTION:  ? ?Continue tube feeds via OG tube: ?- Vital 1.5 @ 50 ml/hr (1200 ml/day) ?- ProSource TF 45 ml QID ? ?Tube feeding regimen provides 1960 kcal, 125 grams of protein, and 917 ml of H2O.  ? ?NUTRITION DIAGNOSIS:  ? ?Inadequate oral intake related to inability to eat as evidenced by NPO status. ? ?Ongoing, being addressed via TF ? ?GOAL:  ? ?Patient will meet greater than or equal to 90% of their needs ? ?Met via TF ? ?MONITOR:  ? ?Vent status, Labs, Weight trends, TF tolerance, I & O's ? ?REASON FOR ASSESSMENT:  ? ?Ventilator ?  ? ?ASSESSMENT:  ? ?61 year old female with PMH of T2DM, CHF, COPD, GERD, chronic lymphedema who presented with AMS and required intubation. ? ?03/24 - transferred to [facility] ? ?Discussed pt with RN and during ICU rounds. Pt with multifocal pneumonia and sepsis. Pt no longer requiring pressors. Plan is to push diuresis, increase duration of abx, and recheck sputum culture. ? ?Pt with OG tube in stomach per x-ray. Tube feeds infusing as ordered. RD to add ProSource TF to better meet pt's estimated needs. ? ?Spoke with pt's family at bedside who report pt had poor PO intake for the last 3 months PTA. Family reports pt had a fall about 3 months ago and was less active and became more depressed. Prior to fall, pt had a great appetite and would eat 2-3 meals daily. A meal may include a large omelet for breakfast. After the fall, pt would "pick at food" throughout the day but would eat "junk" in the middle of the night like cookies, ice cream, etc. Pt's family reports pt likely with recent weight gain secondary to more fluid retention. They deny pt experiencing any dramatic weight loss recently. ? ?Admit weight: 100 kg ?Current weight: 106.9 kg ? ?Pt with non-pitting generalized edema, mild to moderate pitting edema to BUE, and mild to moderate pitting edema to BLE. ? ?Current TF: Vital 1.5 @ 50 ml/hr ? ?Patient is  currently intubated on ventilator support ?MV: 11.4 L/min ?Temp (24hrs), Avg:98 ?F (36.7 ?C), Min:96.4 ?F (35.8 ?C), Max:98.8 ?F (37.1 ?C) ? ?Drips: ?Propofol: 25.7 ml/hr (provides 678 kcal daily from lipid) ?Fentanyl ? ?Medications reviewed and include: colace, IV lasix 40 mg TID, SSI q 4 hours, novolog 3 units q 4 hours, levemir 20 units BID, protonix, miralax, prednisone, spironolactone, IV abx ? ?Labs reviewed: BUN 28, hemoglobin 9.9, hemoglobin A1C 7.5 ?CBG's: 222-303 x 24 hours ? ?UOP: 3019 ml x 24 hours ?I/O's: +6.1 L since admit ? ?NUTRITION - FOCUSED PHYSICAL EXAM: ? ?Flowsheet Row Most Recent Value  ?Orbital Region No depletion  ?Upper Arm Region No depletion  ?Thoracic and Lumbar Region No depletion  ?Buccal Region Unable to assess  ?Temple Region No depletion  ?Clavicle Bone Region No depletion  ?Clavicle and Acromion Bone Region No depletion  ?Scapular Bone Region Unable to assess  ?Dorsal Hand No depletion  ?Patellar Region No depletion  ?Anterior Thigh Region No depletion  ?Posterior Calf Region No depletion  ?Edema (RD Assessment) Moderate  [BUE, BLE]  ?Hair Reviewed  ?Eyes Unable to assess  ?Mouth Unable to assess  ?Skin Reviewed  ?Nails Reviewed  ? ?  ? ? ?Diet Order:   ?Diet Order   ? ?       ?  Diet NPO time specified  Diet effective now       ?  ? ?  ?  ? ?  ? ? ?  EDUCATION NEEDS:  ? ?Not appropriate for education at this time ? ?Skin:  Skin Assessment: Reviewed RN Assessment ? ?Last BM:  06/25/21 medium type 7 ? ?Height:  ? ?Ht Readings from Last 1 Encounters:  ?06/26/21 (S) _0  (1.651 m)  ? ? ?Weight:  ? ?Wt Readings from Last 1 Encounters:  ?06/28/21 106.9 kg  ? ? ?Ideal Body Weight:  56.8 kg ? ?BMI:  Body mass index is 39.22 kg/m?. ? ?Estimated Nutritional Needs:  ? ?Kcal:  1900-2100 ? ?Protein:  115-130 grams ? ?Fluid:  1.9 L/day ? ? ? ?Gustavus Bryant, MS, RD, LDN ?Inpatient Clinical Dietitian ?Please see AMiON for contact information. ? ?

## 2021-06-29 ENCOUNTER — Inpatient Hospital Stay (HOSPITAL_COMMUNITY): Payer: 59

## 2021-06-29 DIAGNOSIS — G9341 Metabolic encephalopathy: Secondary | ICD-10-CM | POA: Diagnosis not present

## 2021-06-29 DIAGNOSIS — I5032 Chronic diastolic (congestive) heart failure: Secondary | ICD-10-CM | POA: Diagnosis not present

## 2021-06-29 DIAGNOSIS — J9601 Acute respiratory failure with hypoxia: Secondary | ICD-10-CM | POA: Diagnosis not present

## 2021-06-29 DIAGNOSIS — J9602 Acute respiratory failure with hypercapnia: Secondary | ICD-10-CM | POA: Diagnosis not present

## 2021-06-29 DIAGNOSIS — I10 Essential (primary) hypertension: Secondary | ICD-10-CM | POA: Diagnosis not present

## 2021-06-29 LAB — BASIC METABOLIC PANEL
Anion gap: 11 (ref 5–15)
BUN: 45 mg/dL — ABNORMAL HIGH (ref 6–20)
CO2: 32 mmol/L (ref 22–32)
Calcium: 8.6 mg/dL — ABNORMAL LOW (ref 8.9–10.3)
Chloride: 104 mmol/L (ref 98–111)
Creatinine, Ser: 0.89 mg/dL (ref 0.44–1.00)
GFR, Estimated: >60 mL/min (ref >60)
Glucose, Bld: 167 mg/dL — ABNORMAL HIGH (ref 70–99)
Potassium: 3.9 mmol/L (ref 3.5–5.1)
Sodium: 147 mmol/L — ABNORMAL HIGH (ref 135–145)

## 2021-06-29 LAB — POCT I-STAT 7, (LYTES, BLD GAS, ICA,H+H)
Acid-Base Excess: 11 mmol/L — ABNORMAL HIGH (ref 0.0–2.0)
Bicarbonate: 37.4 mmol/L — ABNORMAL HIGH (ref 20.0–28.0)
Calcium, Ion: 1.15 mmol/L (ref 1.15–1.40)
HCT: 27 % — ABNORMAL LOW (ref 36.0–46.0)
Hemoglobin: 9.2 g/dL — ABNORMAL LOW (ref 12.0–15.0)
O2 Saturation: 97 %
Patient temperature: 98.7
Potassium: 3.8 mmol/L (ref 3.5–5.1)
Sodium: 146 mmol/L — ABNORMAL HIGH (ref 135–145)
TCO2: 39 mmol/L — ABNORMAL HIGH (ref 22–32)
pCO2 arterial: 57.3 mmHg — ABNORMAL HIGH (ref 32–48)
pH, Arterial: 7.423 (ref 7.35–7.45)
pO2, Arterial: 92 mmHg (ref 83–108)

## 2021-06-29 LAB — CULTURE, BLOOD (ROUTINE X 2)
Culture: NO GROWTH
Special Requests: ADEQUATE

## 2021-06-29 LAB — GLUCOSE, CAPILLARY
Glucose-Capillary: 197 mg/dL — ABNORMAL HIGH (ref 70–99)
Glucose-Capillary: 174 mg/dL — ABNORMAL HIGH (ref 70–99)
Glucose-Capillary: 179 mg/dL — ABNORMAL HIGH (ref 70–99)
Glucose-Capillary: 165 mg/dL — ABNORMAL HIGH (ref 70–99)
Glucose-Capillary: 143 mg/dL — ABNORMAL HIGH (ref 70–99)
Glucose-Capillary: 70 mg/dL (ref 70–99)

## 2021-06-29 LAB — CBC
HCT: 29.4 % — ABNORMAL LOW (ref 36.0–46.0)
Hemoglobin: 9.5 g/dL — ABNORMAL LOW (ref 12.0–15.0)
MCH: 32.8 pg (ref 26.0–34.0)
MCHC: 32.3 g/dL (ref 30.0–36.0)
MCV: 101.4 fL — ABNORMAL HIGH (ref 80.0–100.0)
Platelets: 220 10*3/uL (ref 150–400)
RBC: 2.9 MIL/uL — ABNORMAL LOW (ref 3.87–5.11)
RDW: 16.3 % — ABNORMAL HIGH (ref 11.5–15.5)
WBC: 10.1 10*3/uL (ref 4.0–10.5)
nRBC: 1 % — ABNORMAL HIGH (ref 0.0–0.2)

## 2021-06-29 LAB — TRIGLYCERIDES: Triglycerides: 348 mg/dL — ABNORMAL HIGH (ref <150)

## 2021-06-29 MED ORDER — ROCURONIUM BROMIDE 10 MG/ML (PF) SYRINGE
PREFILLED_SYRINGE | INTRAVENOUS | Status: AC
  Filled 2021-06-29: qty 10

## 2021-06-29 MED ORDER — FENTANYL CITRATE (PF) 100 MCG/2ML IJ SOLN
INTRAMUSCULAR | Status: AC
  Filled 2021-06-29: qty 2

## 2021-06-29 MED ORDER — FREE WATER
100.0000 mL | Freq: Three times a day (TID) | Status: DC
Start: 2021-06-29 — End: 2021-06-30

## 2021-06-29 MED ORDER — DEXMEDETOMIDINE HCL IN NACL 400 MCG/100ML IV SOLN
INTRAVENOUS | Status: AC
  Administered 2021-06-29: 0.4 ug/kg/h via INTRAVENOUS
  Filled 2021-06-29: qty 100

## 2021-06-29 MED ORDER — DEXMEDETOMIDINE HCL IN NACL 400 MCG/100ML IV SOLN: 0.0000 ug/kg/h | INTRAVENOUS | Status: DC

## 2021-06-29 MED ORDER — ETOMIDATE 2 MG/ML IV SOLN
20.0000 mg | Freq: Once | INTRAVENOUS | Status: AC
  Administered 2021-06-29: 20 mg via INTRAVENOUS

## 2021-06-29 MED ORDER — ETOMIDATE 2 MG/ML IV SOLN
INTRAVENOUS | Status: AC
  Filled 2021-06-29: qty 10

## 2021-06-29 MED ORDER — PREDNISONE 5 MG PO TABS
10.0000 mg | ORAL_TABLET | Freq: Every day | ORAL | Status: DC
  Administered 2021-06-30 – 2021-07-19 (×20): 10 mg
  Filled 2021-06-29 (×3): qty 1
  Filled 2021-06-29: qty 2
  Filled 2021-06-29 (×2): qty 1
  Filled 2021-06-29: qty 2
  Filled 2021-06-29: qty 1
  Filled 2021-06-29 (×2): qty 2
  Filled 2021-06-29: qty 1
  Filled 2021-06-29: qty 2
  Filled 2021-06-29 (×8): qty 1
  Filled 2021-06-29: qty 2

## 2021-06-29 MED ORDER — FUROSEMIDE 10 MG/ML IJ SOLN: 80.0000 mg | Freq: Three times a day (TID) | INTRAMUSCULAR | Status: DC

## 2021-06-29 MED ORDER — QUETIAPINE FUMARATE 100 MG PO TABS
100.0000 mg | ORAL_TABLET | Freq: Two times a day (BID) | ORAL | Status: DC
  Administered 2021-06-29 – 2021-06-30 (×2): 100 mg
  Filled 2021-06-29 (×2): qty 1

## 2021-06-29 MED ORDER — INSULIN ASPART 100 UNIT/ML IJ SOLN
5.0000 [IU] | INTRAMUSCULAR | Status: DC
  Administered 2021-06-29: 5 [IU] via SUBCUTANEOUS

## 2021-06-29 MED ORDER — LOSARTAN POTASSIUM 50 MG PO TABS
25.0000 mg | ORAL_TABLET | Freq: Every day | ORAL | Status: DC
  Administered 2021-06-29: 25 mg
  Filled 2021-06-29: qty 1

## 2021-06-29 MED ORDER — ROCURONIUM BROMIDE 10 MG/ML (PF) SYRINGE
100.0000 mg | PREFILLED_SYRINGE | Freq: Once | INTRAVENOUS | Status: AC
  Administered 2021-06-29: 100 mg via INTRAVENOUS

## 2021-06-29 MED ORDER — FENTANYL CITRATE (PF) 100 MCG/2ML IJ SOLN
100.0000 ug | Freq: Once | INTRAMUSCULAR | Status: AC
  Administered 2021-06-29: 100 ug via INTRAVENOUS

## 2021-06-29 MED ORDER — PREDNISONE 20 MG PO TABS
40.0000 mg | ORAL_TABLET | Freq: Once | ORAL | Status: AC
  Administered 2021-06-29: 40 mg
  Filled 2021-06-29: qty 2

## 2021-06-29 MED ORDER — BETHANECHOL CHLORIDE 10 MG PO TABS
10.0000 mg | ORAL_TABLET | Freq: Three times a day (TID) | ORAL | Status: AC
  Administered 2021-06-29 – 2021-07-01 (×7): 10 mg
  Filled 2021-06-29 (×9): qty 1

## 2021-06-29 MED ORDER — FUROSEMIDE 10 MG/ML IJ SOLN
40.0000 mg | Freq: Three times a day (TID) | INTRAMUSCULAR | Status: DC
  Administered 2021-06-29 (×3): 40 mg via INTRAVENOUS
  Filled 2021-06-29 (×3): qty 4

## 2021-06-29 MED ORDER — INSULIN DETEMIR 100 UNIT/ML ~~LOC~~ SOLN
24.0000 [IU] | Freq: Two times a day (BID) | SUBCUTANEOUS | Status: DC
  Administered 2021-06-29 (×2): 24 [IU] via SUBCUTANEOUS
  Filled 2021-06-29 (×4): qty 0.24

## 2021-06-29 NOTE — Progress Notes (Signed)
? ?Progress Note ? ?Patient Name: Gina Bradley ?Date of Encounter: 06/29/2021 ? ?[facility] Cardiologist: Rozann Lesches, MD  ? ?Subjective  ? ?Pt intubated  Sedated  ?Inpatient Medications  ?  ?Scheduled Meds: ? amiodarone  200 mg Per Tube BID  ? arformoterol  15 mcg Nebulization BID  ? aspirin  81 mg Per Tube Daily  ? atorvastatin  40 mg Per Tube Daily  ? budesonide (PULMICORT) nebulizer solution  0.25 mg Nebulization BID  ? chlorhexidine gluconate (MEDLINE KIT)  15 mL Mouth Rinse BID  ? Chlorhexidine Gluconate Cloth  6 each Topical Q0600  ? clonazePAM  1 mg Per Tube Q6H  ? docusate  100 mg Per Tube BID  ? enoxaparin (LOVENOX) injection  55 mg Subcutaneous Q24H  ? feeding supplement (PROSource TF)  45 mL Per Tube QID  ? furosemide  80 mg Intravenous TID  ? insulin aspart  0-20 Units Subcutaneous Q4H  ? insulin aspart  5 Units Subcutaneous Q4H  ? insulin detemir  20 Units Subcutaneous BID  ? losartan  50 mg Per Tube Daily  ? mouth rinse  15 mL Mouth Rinse 10 times per day  ? oxyCODONE  10 mg Per Tube Q4H  ? pantoprazole sodium  40 mg Per Tube Daily  ? polyethylene glycol  17 g Per Tube Daily  ? [START ON 06/30/2021] predniSONE  10 mg Per Tube Q breakfast  ? QUEtiapine  50 mg Per Tube BID  ? revefenacin  175 mcg Nebulization Daily  ? sodium chloride flush  10-40 mL Intracatheter Q12H  ? spironolactone  50 mg Per Tube BID  ? ?Continuous Infusions: ? sodium chloride 10 mL/hr at 06/29/21 0400  ? feeding supplement (VITAL 1.5 CAL) 1,000 mL (06/28/21 1430)  ? fentaNYL infusion INTRAVENOUS 350 mcg/hr (06/29/21 0410)  ? piperacillin-tazobactam (ZOSYN)  IV 3.375 g (06/29/21 6381)  ? propofol (DIPRIVAN) infusion 55 mcg/kg/min (06/29/21 7711)  ? ?PRN Meds: ?sodium chloride, acetaminophen, albuterol, docusate, fentaNYL, hydrALAZINE, midazolam, ondansetron (ZOFRAN) IV, polyethylene glycol, sodium chloride flush  ? ?Vital Signs  ?  ?Vitals:  ? 06/29/21 0802 06/29/21 6579 06/29/21 0383 06/29/21 3383  ?BP: (!) 113/52      ?Pulse: 75     ?Resp: 18     ?Temp:      ?TempSrc:      ?SpO2: 96% 99% 100% 97%  ?Weight:      ?Height:      ? ? ?Intake/Output Summary (Last 24 hours) at 06/29/2021 0849 ?Last data filed at 06/29/2021 0608 ?Gross per 24 hour  ?Intake 2695.49 ml  ?Output 2350 ml  ?Net 345.49 ml  ? ? ?  06/29/2021  ?  4:55 AM 06/29/2021  ? 12:10 AM 06/28/2021  ?  2:42 AM  ?Last 3 Weights  ?Weight (lbs) 233 lb 0.4 oz 233 lb 0.4 oz 235 lb 10.8 oz  ?Weight (kg) 105.7 kg 105.7 kg 106.9 kg  ?   ? ?Telemetry  ?  ?Sinus rhythmPersonally Reviewed ? ?ECG  ? ?No new tracings.  ? ?Physical Exam  ? ?GEN: Currently intubated and sedated.  ?Neck:Neck is full  ?Cardiac: RRR, no murmurs,  ?Respiratory:  Rhonchi ?GI: Soft, non-distended  ?MS: 2+ lower extremity edema bilaterally; No deformity. ?Psych: Unable to be assessed. Intubated and sedated.  ? ?Labs  ?  ?High Sensitivity Troponin:   ?Recent Labs  ?Lab 06/22/21 ?0352 06/22/21 ?2919 06/24/21 ?1660 06/24/21 ?6004  ?TROPONINIHS 94* 218* 26* 23*  ?   ?Chemistry ?Recent Labs  ?  Lab 06/24/21 ?0357 06/25/21 ?0306 06/25/21 ?1759 06/26/21 ?5400 06/27/21 ?0327 06/27/21 ?8676 06/28/21 ?0406 06/28/21 ?1950 06/29/21 ?0820  ?NA 136 138   < > 137   < > 143 145 144 146*  ?K 3.9 3.0*   < > 3.8   < > 3.6 4.1 4.5 3.8  ?CL 104 104  --  103  --  107 108  --   --   ?CO2 23 24  --  26  --  29 29  --   --   ?GLUCOSE 207* 259*  --  285*  --  308* 296*  --   --   ?BUN 15 14  --  14  --  19 28*  --   --   ?CREATININE 0.76 0.73  --  0.76  --  0.73 0.69  --   --   ?CALCIUM 8.2* 7.4*  --  7.0*  --  8.0* 8.0*  --   --   ?MG 1.9 1.7  --  2.0  --   --   --   --   --   ?PROT 5.4* 4.6*  --  4.2*  --  4.9*  --   --   --   ?ALBUMIN 2.4* 1.9*  --  1.6*  --  1.7*  --   --   --   ?AST 83* 46*  --  48*  --  51*  --   --   --   ?ALT 100* 60*  --  48*  --  47*  --   --   --   ?ALKPHOS 92 68  --  64  --  74  --   --   --   ?BILITOT 0.2* 0.3  --  0.4  --  0.3  --   --   --   ?GFRNONAA >60 >60  --  >60  --  >60 >60  --   --   ?ANIONGAP 9 10  --   8  --  7 8  --   --   ? < > = values in this interval not displayed.  ?  ?Lipids  ?Recent Labs  ?Lab 06/27/21 ?0333  ?TRIG 185*  ?  ?Hematology ?Recent Labs  ?Lab 06/27/21 ?9326 06/28/21 ?0406 06/28/21 ?7124 06/29/21 ?5809 06/29/21 ?0820  ?WBC 8.9 8.5  --  10.1  --   ?RBC 2.98* 3.00*  --  2.90*  --   ?HGB 9.4* 9.5* 9.9* 9.5* 9.2*  ?HCT 29.3* 29.5* 29.0* 29.4* 27.0*  ?MCV 98.3 98.3  --  101.4*  --   ?MCH 31.5 31.7  --  32.8  --   ?MCHC 32.1 32.2  --  32.3  --   ?RDW 16.1* 16.0*  --  16.3*  --   ?PLT 176 196  --  220  --   ? ?Thyroid No results for input(s): TSH, FREET4 in the last 168 hours.  ?BNP ?Recent Labs  ?Lab 06/23/21 ?0354 06/24/21 ?9833 06/25/21 ?0306  ?BNP 117.0* 93.0 89.0  ?  ?DDimer No results for input(s): DDIMER in the last 168 hours.  ? ?Radiology  ?  ?DG CHEST PORT 1 VIEW ? ?Result Date: 06/29/2021 ?CLINICAL DATA:  Shortness of breath.  COPD EXAM: PORTABLE CHEST 1 VIEW COMPARISON:  Yesterday FINDINGS: Endotracheal tube terminates 4.3 cm above carina. Nasogastric tube terminates at the gastric fundus. Right-sided PICC line tip at high right atrium. Apical lordotic positioning. Midline trachea. Normal heart size for level of inspiration.  No pleural effusion or pneumothorax. Left greater than right lower lung predominant airspace disease is not significantly changed. IMPRESSION: No significant change since one day prior. Basilar predominant airspace disease, most consistent with multifocal pneumonia. Electronically Signed   By: Abigail Miyamoto M.D.   On: 06/29/2021 08:23  ? ?DG CHEST PORT 1 VIEW ? ?Result Date: 06/28/2021 ?CLINICAL DATA:  Ventilator dependent respiratory failure pneumonia. EXAM: PORTABLE CHEST 1 VIEW COMPARISON:  Portable chest yesterday at 4:52 a.m. FINDINGS: 1:11 a.m., 06/28/2021. Right PICC tip is at the superior cavoatrial junction. ETT tip is 6.5 cm from the carina. Enteric tube is well inside the stomach with the tube extending to the left and turning cephalad with tip in the proximal  fundus. The heart is enlarged. Stable mediastinum with aortic atherosclerosis. Small pleural effusions and mild central vascular prominence. Interstitial and patchy airspace opacities of the mid to lower lung fields progressively dense towards the bases are again shown. Today there may be slight improvement in the densest infiltrates in the lung bases. No new or worsening lung infiltrate is seen or other interval changes. IMPRESSION: 1. There appears to be a slight improvement in most dense consolidative opacities in the lung bases. No other changes in the overall aeration. 2. Stable cardiomegaly, small pleural effusions. 3. Adequately inserted support devices. Electronically Signed   By: Telford Nab M.D.   On: 06/28/2021 01:28   ? ?Cardiac Studies  ? ?Echocardiogram: 06/26/21  ?1. Left ventricular ejection fraction, by estimation, is 65 to 70%. The  ?left ventricle has normal function. The left ventricle has no regional  ?wall motion abnormalities. Left ventricular diastolic parameters are  ?indeterminate.  ? 2. Right ventricular systolic function is normal. The right ventricular  ?size is normal. Tricuspid regurgitation signal is inadequate for assessing  ?PA pressure.  ? 3. The mitral valve is normal in structure. No evidence of mitral valve  ?regurgitation.  ? 4. The aortic valve was not well visualized. Aortic valve regurgitation  ?is not visualized. No aortic stenosis is present.  ? ?Patient Profile  ?   ?61 y.o. female w/ PMH of PSVT, Type 2 DM, HTN, HLD, lymphedema and COPD who is currently admitted with acute hypoxic respiratory failure in the setting of PNA. Cardiology consulted due to SVT.  ? ?Assessment & Plan  ?  ?SVT ?- Known history of SVT prior to this admission (on Toprol-XL 161m daily) but had recurrence on admission and BP did not allow for the use of AV nodal blocking agents.  ?- Patient was started on Amiodarone for rhythm suppression     ?On PO 200 bid now   No arrhythmias    I would keep on  this for now   ? ?Acute on Chronic Diastolic CHF  ?- Echo this admission showed EF 65-70% ?- History of grade I diastolic dysfunction on echo in 08/2020  ?- Continue Losartan 50 mg daily  ?- Continue las

## 2021-06-29 NOTE — Progress Notes (Addendum)
eLink Physician-Brief Progress Note ?Patient Name: Gina Bradley ?DOB: Mar 08, 1961 ?MRN: [id number] ? ? ?Date of Service ? 06/29/2021  ?HPI/Events of Note ? Patient with oliguria / urinary retention, she's failed 24 hours of in / out catheterization, she has 400 cc of urine in the bladder currently on bladder scan.  ?eICU Interventions ? Foley catheter insertion ordered.  ? ? ? ?  ? ?[name] ?06/29/2021, 12:32 AM ?

## 2021-06-29 NOTE — Procedures (Addendum)
Bronchoscopy Procedure Note ? ?Gina Bradley  ?[id number]  ?1961-03-26 ? ?Date:06/29/21  ?Time:3:18 PM  ? ?Provider Performing:Dr Alfonse Spruce under direction of Candee Furbish  ? ?Procedure(s):  Flexible Bronchoscopy (03159) and Initial Therapeutic Aspiration of Tracheobronchial Tree (45859) ? ?Indication(s) ?Assist with trach placement ?Mucus plugging of bronchi ? ?Consent ?Risks of the procedure as well as the alternatives and risks of each were explained to the patient and/or caregiver.  Consent for the procedure was obtained and is signed in the bedside chart ? ?Anesthesia ?In place for  ? ?Time Out ?Verified patient identification, verified procedure, site/side was marked, verified correct patient position, special equipment/implants available, medications/allergies/relevant history reviewed, required imaging and test results available. ? ? ?Sterile Technique ?Usual hand hygiene, masks, gowns, and gloves were used ? ? ?Procedure Description ?Bronchoscope advanced through endotracheal tube and into airway.  Airways were examined down to subsegmental level with findings noted below.   ?Following diagnostic evaluation, Therapeutic aspiration performed in bilateral mainstem of thick tenacous mucoid fluid ? ?Findings:  ?- ETT in good position ?- Direct visualization of trach placement, appropriately positioned above carina ? ? ?Complications/Tolerance ?None; patient tolerated the procedure well. ?Chest X-ray is needed post procedure. ? ? ?EBL ?Minimal ? ? ?Specimen(s) ?None ? ?

## 2021-06-29 NOTE — Progress Notes (Signed)
Critical care attending attestation note: ?I agree with Doran Stabler, DO note, impression, and recommendations as outlined. I have taken an independent interval history, reviewed the chart and examined the patient. The following reflects my medical decision making and independent critical care time ? ? ?Synopsis of assessment and plan: ?61 year old female with coronary artery disease, HFpEF and steroid-dependent COPD here with acute hypoxic respiratory failure due to bilateral multifocal pneumonia ? ?Patient remains sedated and on ventilator ?She is afebrile ?  ?Physical exam: ?General: Critically ill-appearing obese female, orally intubated ?HEENT: [place]/AT, eyes anicteric.  ETT and OGT in place ?Neuro: Sedated, not following commands.  Eyes are closed.  Pupils 3 mm bilateral reactive to light ?Chest: Coarse breath sounds, no wheezes or rhonchi ?Heart: Regular rate and rhythm, no murmurs or gallops ?Abdomen: Soft, nontender, nondistended, bowel sounds present ?Skin: No rash ? ?Labs and images were reviewed ? ?Assessment and plan: ?Acute hypoxic/hypercapnic respiratory failure ?Severe sepsis due to bilateral multifocal pneumonia, improved ?Acute on chronic HFrEF ?Acute septic encephalopathy ?Hypokalemia/hyponatremia ?Congestive hepatopathy ?Recurrent SVT on amiodarone ?Type II MI due to demand cardiac ischemia ?Morbid obesity ?Anemia of critical illness ?Poorly controlled diabetes with hyperglycemia ?Right subacute inferior pubic ramus fracture ? ?Continue lung protective ventilation, patient is tolerating spontaneous breathing trial ?She remained deeply sedated, sedation was stopped this morning ?We will try to see if she wakes up we will try to extubate her ?If she fails extubation unfortunately she is not require tracheostomy ?Patient completed antibiotic therapy ?Continue diuresis ?Monitor intake and output ?Continue aggressive electrolyte supplement ?LFTs improved ?Continue amiodarone appreciate cardiology  input ?Monitor H&H ?Increase Lantus to 24 units, continue sliding scale insulin ? ? ?CRITICAL CARE ?Performed by: Cheri Fowler ? ? ?Total independent critical care time: 37 minutes ? ?Critical care time was exclusive of separately billable procedures and treating other patients. ? ?Critical care was necessary to treat or prevent imminent or life-threatening deterioration. ? ? ?Critical care was time spent personally by me on the following activities: development of treatment plan with patient and/or surrogate as well as nursing, discussions with consultants, evaluation of patient's response to treatment, examination of patient, obtaining history from patient or surrogate, ordering and performing treatments and interventions, ordering and review of laboratory studies, ordering and review of radiographic studies, pulse oximetry, re-evaluation of patient's condition and participation in multidisciplinary rounds. ? ?Cheri Fowler MD ?[facility] ?See Amion for pager ?If no response to pager, please call [phone] until 7pm ?After 7pm, Please call E-link [phone] ? ?06/29/2021, 10:02 AM ? ? ?

## 2021-06-29 NOTE — Progress Notes (Signed)
? ?NAME:  AMEILIA Bradley, MRN:  [id number], DOB:  Jul 31, 1960, LOS: 7 ?ADMISSION DATE:  06/22/2021, CONSULTATION DATE:  3/21 ?REFERRING MD:  Wynetta Emery, [facility], CHIEF COMPLAINT:  acute resp failure/ fever wth AMS and SVT ? ?History of Present Illness:  ?61 yo female former smoker presented to [facility] ER on 3/21 with AMS, fever.  Found to have multifocal pneumonia and required intubation and pressors.  Transferred to [facility] for further management. ? ?Pertinent  Medical History  ?Anemia, Back pain, Spinal stenosis COPD, DM type 2, HTN, GERD, CHF, Lymphedema, Neuropathy, SVT ? ?Significant Hospital Events: ?Including procedures, antibiotic start and stop dates in addition to other pertinent events   ?3/21 Admit to [facility], intubated, start pressors, cardiology consult for SVT, start amiodarone ?3/24 transfer to [facility] ? ?Studies:  ?CT head 3/21 >> mild cerebral atrophy, small-vessel disease and atrophic ventriculomegaly with unremarkable cerebellum and brainstem ?CT angio chest 3/21 >> subacute 3rd/4th rib fractures on Rt, Lt anterior subacute rib fx, multifocal pneumonia with ATX ?CT abd/pelvis 3/21 >> endometrium thickening. Remote L4 compression fracture with spurs and displaced inferior endplate causing biforaminal impingement at L4-5. Subacute right sacral insufficiency fracture with fat stranding and callus. Healing right obturator ring ?fractures. Healing left third through fifth lumbar transverse process fractures with callus. Nondisplaced left pubic body fracture. ? ?Interim History / Subjective:  ? ?Patient is intubated and sedated.  Not waking up to physical or verbal stimulation. ? ?Afebrile ?325 Fentanyl. 55 propofol  ? ?Objective   ?Blood pressure (!) 109/50, pulse 73, temperature 98.7 ?F (37.1 ?C), temperature source Axillary, resp. rate 19, height (S) 5\' 5"  (1.651 m), weight 105.7 kg, last menstrual period 01/05/2015, SpO2 97 %. ?   ?Vent Mode: PRVC ?FiO2 (%):  [40 %-50 %] 50 % ?Set Rate:  [20 bmp] 20 bmp ?Vt Set:  [460  mL] 460 mL ?PEEP:  [8 cmH20] 8 cmH20 ?Plateau Pressure:  [21 [id number] cmH20] 22 cmH20  ? ?Intake/Output Summary (Last 24 hours) at 06/29/2021 0731 ?Last data filed at 06/29/2021 0608 ?Gross per 24 hour  ?Intake 3500.14 ml  ?Output 3100 ml  ?Net 400.14 ml  ? ? ?Filed Weights  ? 06/28/21 0242 06/29/21 0010 06/29/21 0455  ?Weight: 106.9 kg 105.7 kg 105.7 kg  ? ?   ?Examination: ? ?General -sedated ?Eyes -eyes closed ?ENT - ETT in place ?Cardiac -regular rate and rhythm ?Chest - rhonchi heard bilat ?Abdomen - soft, non tender, + bowel sounds ?Extremities - 1+ edema ?Skin - areas of scattered ecchymosis ?Neuro -sedated.  Not waking up. ? ?Resolved Problems:  ?Septic shock, Elevated troponin from demand ischemia ? ?Assessment & Plan:  ?Acute on chronic hypoxic/hypercapnic respiratory failure from multifocal bacterial community acquired pneumonia (POA). ?COPD with acute exacerbation. ?- goal SpO2 > 90% ?- pulmicort, brovana, yupelri ?- prn albuterol ?- Prednisone 40 mg today and back to home does Prednisone 10 mg.  ?- Pending ABG and CXR. Wake up trial this morning.  If cannot wean off of ventilator, may need a tracheostomy.  ? ?Community acquired pneumonia vs aspiration PNA. ?- blood culture likely contaminate ?- Fever curve and WBC trending down.  ?- Will stop Zosyn. Pending repeat respiratory culture. ? ?Paroxysmal SVT. ?Acute on chronic diastolic CHF, exacerbated by SVT ?HTN, HLD, CAD ?- Continue enteral amiodarone. Can be transition to BB. Defer to cardiology ?- Continue Lasix to 40 mg TID. Still net positive 7L ?- Continue ASA, lipitor, losartan and aldactone ?- hold outpt isosorbide.  ? ?Acute metabolic encephalopath ?Hx  of peripheral neuropathy, back pain, chronic opiate use. ?- UDS positive for barbiturates ?- Cont Fentanyl and start Propofol.  ?- Increase PO Oxy to 15 mg Q4h, Seroquel to 100 mg BID. Continue Klonopin 1 mg Q6h.  ?- RASS goal 0 to -1 ?- Versed 2 mg Q2h PRN ?- hold outpt neurontin, robaxin, oxycodone,  norco ? ?Anemia of critical illness. ?- f/u CBC ?- transfuse for Hb < 7 ? ?DM type 2 poorly controlled with steroid induced hyperglycemia. ?Fasting CBG 174  ?- Increase Levemir to 20 units BID ?- Tube feed coverage Novolog 3 unit Q4H ?- SSI ? ?Endometrial thickening on CT scan. ?- will need outpt f/u with Gyn ? ?Rt subacute inferior pubic ramus fracture. ?- seen in ED on 06/06/21 and scheduled for outpt orthopedic follow up ? ?Best Practice (right click and "Reselect all SmartList Selections" daily)  ?DVT: lovenox ?SUP: protonix ?Diet: tube feeds ?Lines: Rt PICC 3/23 ?Foley: Purewick ?Goals of care: Full code ? ?Labs   ? ? ?  Latest Ref Rng & Units 06/28/2021  ?  4:57 AM 06/28/2021  ?  4:06 AM 06/27/2021  ?  3:32 AM  ?CMP  ?Glucose 70 - 99 mg/dL  296   308    ?BUN 6 - 20 mg/dL  28   19    ?Creatinine 0.44 - 1.00 mg/dL  0.69   0.73    ?Sodium 135 - 145 mmol/L 144   145   143    ?Potassium 3.5 - 5.1 mmol/L 4.5   4.1   3.6    ?Chloride 98 - 111 mmol/L  108   107    ?CO2 22 - 32 mmol/L  29   29    ?Calcium 8.9 - 10.3 mg/dL  8.0   8.0    ?Total Protein 6.5 - 8.1 g/dL   4.9    ?Total Bilirubin 0.3 - 1.2 mg/dL   0.3    ?Alkaline Phos 38 - 126 U/L   74    ?AST 15 - 41 U/L   51    ?ALT 0 - 44 U/L   47    ? ? ? ?  Latest Ref Rng & Units 06/29/2021  ?  4:29 AM 06/28/2021  ?  4:57 AM 06/28/2021  ?  4:06 AM  ?CBC  ?WBC 4.0 - 10.5 K/uL 10.1    8.5    ?Hemoglobin 12.0 - 15.0 g/dL 9.5   9.9   9.5    ?Hematocrit 36.0 - 46.0 % 29.4   29.0   29.5    ?Platelets 150 - 400 K/uL 220    196    ? ? ?ABG ?   ?Component Value Date/Time  ? PHART 7.470 (H) 06/28/2021 0457  ? PCO2ART 42.5 06/28/2021 0457  ? PO2ART 74 (L) 06/28/2021 0457  ? HCO3 31.1 (H) 06/28/2021 0457  ? TCO2 32 06/28/2021 0457  ? ACIDBASEDEF 0.6 06/24/2021 1855  ? O2SAT 96 06/28/2021 0457  ? ? ?CBG (last 3)  ?Recent Labs  ?  06/28/21 ?1948 06/28/21 ?2336 06/29/21 ?0321  ?GLUCAP 278* 242* 197*  ? ? ?Critical care time:   ? ?Gaylan Gerold, DO ?

## 2021-06-29 NOTE — Progress Notes (Signed)
SLP Cancellation Note ? ?Patient Details ?Name: Gina Bradley ?MRN: [id number] ?DOB: 11/22/1960 ? ? ?Cancelled evaluation: Orders received for swallow and PMV evaluations when appropriate. Pt underwent trach this afternoon.  Will follow chart for readiness. ? ?Samaiya Awadallah L. Veneta Sliter, MA CCC/SLP ?Acute Rehabilitation Services ?Office number [phone] ?Pager [phone] ?         ? ? ?[name] ?06/29/2021, 3:49 PM ?

## 2021-06-29 NOTE — Procedures (Signed)
Percutaneous Tracheostomy Procedure Note ? ? ?Gina Bradley  ?[id number]  ?12-27-1960 ? ?Date:06/29/21  ?Time:3:33 PM  ? ?Provider Performing:Madysyn Hanken ? ?Procedure: Percutaneous Tracheostomy with Bronchoscopic Guidance (29924) ? ?Indication(s) ?Acute hypoxic respiratory failure ? ?Consent ?Risks of the procedure as well as the alternatives and risks of each were explained to the patient and/or caregiver.  Consent for the procedure was obtained. ? ?Anesthesia ?Etomidate, Versed, Fentanyl, Vecuronium ? ? ?Time Out ?Verified patient identification, verified procedure, site/side was marked, verified correct patient position, special equipment/implants available, medications/allergies/relevant history reviewed, required imaging and test results available. ? ? ?Sterile Technique ?Maximal sterile technique including sterile barrier drape, hand hygiene, sterile gown, sterile gloves, mask, hair covering. ? ? ? ?Procedure Description ?Appropriate anatomy identified by palpation.  Patient's neck prepped and draped in sterile fashion.  1% lidocaine with epinephrine was used to anesthetize skin overlying neck.  1.5cm incision made and blunt dissection performed until tracheal rings could be easily palpated.   Then a size 8 Shiley tracheostomy was placed under bronchoscopic visualization using usual Seldinger technique and serial dilation.   Bronchoscope confirmed placement above the carina.  Tracheostomy was sutured in place with adhesive pad to protect skin under pressure.    Patient connected to ventilator. ? ? ?Complications/Tolerance ?None; patient tolerated the procedure well. ?Chest X-ray is ordered to confirm no post-procedural complication. ? ? ?EBL ?Minimal ? ? ?Specimen(s) ?None  ? ?

## 2021-06-29 NOTE — Plan of Care (Signed)
Interdisciplinary Goals of Care Family Meeting ? ? ?Date carried out:: 06/29/2021 ? ?Location of the meeting: Phone conference ? ?Member's involved: Physician, Bedside Registered Nurse, and Family Member or next of kin ? ?Durable Power of Attorney or acting medical decision maker: Arma Heading   ? ?Discussion: We discussed goals of care for Gina Bradley .   ? ?The Clinical status was relayed to patient's husband over the phone in detail. ?  ?Updated and notified of patients medical condition. ?  ?  ?Patient remains unresponsive and will not open eyes to command.  She remained agitated and requiring deep sedation to keep her in bed, not tolerating coming off sedation on ventilator. ? ?Patient's husband decided to proceed to tracheostomy and would like to keep her alive by any means ? ?Code status: Full Code ? ?Disposition: Continue current acute care ? ?  ?Family are satisfied with Plan of action and management. All questions answered ?  ?Cheri Fowler MD ?[facility] ?See Amion for pager ?If no response to pager, please call [phone] until 7pm ?After 7pm, Please call E-link [phone] ? ?

## 2021-06-30 ENCOUNTER — Inpatient Hospital Stay (HOSPITAL_COMMUNITY): Payer: 59

## 2021-06-30 DIAGNOSIS — J9601 Acute respiratory failure with hypoxia: Secondary | ICD-10-CM | POA: Diagnosis not present

## 2021-06-30 DIAGNOSIS — J9602 Acute respiratory failure with hypercapnia: Secondary | ICD-10-CM | POA: Diagnosis not present

## 2021-06-30 DIAGNOSIS — A419 Sepsis, unspecified organism: Secondary | ICD-10-CM | POA: Diagnosis not present

## 2021-06-30 DIAGNOSIS — J189 Pneumonia, unspecified organism: Secondary | ICD-10-CM | POA: Diagnosis not present

## 2021-06-30 DIAGNOSIS — G9341 Metabolic encephalopathy: Secondary | ICD-10-CM | POA: Diagnosis not present

## 2021-06-30 LAB — CBC
HCT: 29.9 % — ABNORMAL LOW (ref 36.0–46.0)
Hemoglobin: 9.3 g/dL — ABNORMAL LOW (ref 12.0–15.0)
MCH: 31.6 pg (ref 26.0–34.0)
MCHC: 31.1 g/dL (ref 30.0–36.0)
MCV: 101.7 fL — ABNORMAL HIGH (ref 80.0–100.0)
Platelets: 205 10*3/uL (ref 150–400)
RBC: 2.94 MIL/uL — ABNORMAL LOW (ref 3.87–5.11)
RDW: 16.3 % — ABNORMAL HIGH (ref 11.5–15.5)
WBC: 12.1 10*3/uL — ABNORMAL HIGH (ref 4.0–10.5)
nRBC: 0.4 % — ABNORMAL HIGH (ref 0.0–0.2)

## 2021-06-30 LAB — GLUCOSE, CAPILLARY
Glucose-Capillary: 177 mg/dL — ABNORMAL HIGH (ref 70–99)
Glucose-Capillary: 25 mg/dL — CL (ref 70–99)
Glucose-Capillary: 108 mg/dL — ABNORMAL HIGH (ref 70–99)
Glucose-Capillary: 90 mg/dL (ref 70–99)
Glucose-Capillary: 98 mg/dL (ref 70–99)
Glucose-Capillary: 110 mg/dL — ABNORMAL HIGH (ref 70–99)
Glucose-Capillary: 201 mg/dL — ABNORMAL HIGH (ref 70–99)
Glucose-Capillary: 194 mg/dL — ABNORMAL HIGH (ref 70–99)
Glucose-Capillary: 172 mg/dL — ABNORMAL HIGH (ref 70–99)
Glucose-Capillary: 198 mg/dL — ABNORMAL HIGH (ref 70–99)

## 2021-06-30 LAB — BASIC METABOLIC PANEL
Anion gap: 8 (ref 5–15)
BUN: 41 mg/dL — ABNORMAL HIGH (ref 6–20)
CO2: 38 mmol/L — ABNORMAL HIGH (ref 22–32)
Calcium: 7.9 mg/dL — ABNORMAL LOW (ref 8.9–10.3)
Chloride: 101 mmol/L (ref 98–111)
Creatinine, Ser: 0.7 mg/dL (ref 0.44–1.00)
GFR, Estimated: >60 mL/min (ref >60)
Glucose, Bld: 146 mg/dL — ABNORMAL HIGH (ref 70–99)
Potassium: 3.5 mmol/L (ref 3.5–5.1)
Sodium: 147 mmol/L — ABNORMAL HIGH (ref 135–145)

## 2021-06-30 LAB — CULTURE, RESPIRATORY W GRAM STAIN: Culture: NORMAL

## 2021-06-30 LAB — TRIGLYCERIDES: Triglycerides: 446 mg/dL — ABNORMAL HIGH (ref <150)

## 2021-06-30 MED ORDER — INSULIN DETEMIR 100 UNIT/ML ~~LOC~~ SOLN
12.0000 [IU] | Freq: Two times a day (BID) | SUBCUTANEOUS | Status: DC
  Administered 2021-06-30: 12 [IU] via SUBCUTANEOUS
  Filled 2021-06-30 (×3): qty 0.12

## 2021-06-30 MED ORDER — INSULIN DETEMIR 100 UNIT/ML ~~LOC~~ SOLN
15.0000 [IU] | Freq: Two times a day (BID) | SUBCUTANEOUS | Status: DC
Start: 2021-06-30 — End: 2021-06-30
  Filled 2021-06-30 (×2): qty 0.15

## 2021-06-30 MED ORDER — FREE WATER
200.0000 mL | Freq: Three times a day (TID) | Status: DC
  Administered 2021-06-30 – 2021-07-01 (×3): 200 mL

## 2021-06-30 MED ORDER — DEXTROSE 10 % IV SOLN: INTRAVENOUS | Status: AC

## 2021-06-30 MED ORDER — LOSARTAN POTASSIUM 50 MG PO TABS
50.0000 mg | ORAL_TABLET | Freq: Every day | ORAL | Status: DC
  Administered 2021-06-30 – 2021-07-01 (×2): 50 mg
  Filled 2021-06-30 (×4): qty 1

## 2021-06-30 MED ORDER — DEXTROSE 50 % IV SOLN
INTRAVENOUS | Status: AC
  Filled 2021-06-30: qty 50

## 2021-06-30 MED ORDER — FREE WATER: 100.0000 mL | Freq: Three times a day (TID) | Status: DC

## 2021-06-30 MED ORDER — ENOXAPARIN SODIUM 60 MG/0.6ML IJ SOSY
50.0000 mg | PREFILLED_SYRINGE | INTRAMUSCULAR | Status: DC
  Administered 2021-06-30 – 2021-08-04 (×36): 50 mg via SUBCUTANEOUS
  Filled 2021-06-30 (×36): qty 0.6

## 2021-06-30 MED ORDER — POTASSIUM CHLORIDE 10 MEQ/50ML IV SOLN
10.0000 meq | INTRAVENOUS | Status: AC
  Administered 2021-06-30 (×4): 10 meq via INTRAVENOUS
  Filled 2021-06-30 (×4): qty 50

## 2021-06-30 MED ORDER — FREE WATER: 100.0000 mL | Freq: Four times a day (QID) | Status: DC

## 2021-06-30 MED ORDER — METOPROLOL TARTRATE 25 MG PO TABS
25.0000 mg | ORAL_TABLET | Freq: Two times a day (BID) | ORAL | Status: DC
  Administered 2021-06-30 – 2021-07-01 (×2): 25 mg via NASOGASTRIC
  Filled 2021-06-30 (×2): qty 1

## 2021-06-30 MED ORDER — FUROSEMIDE 10 MG/ML IJ SOLN
40.0000 mg | Freq: Two times a day (BID) | INTRAMUSCULAR | Status: DC
  Administered 2021-06-30 (×2): 40 mg via INTRAVENOUS
  Filled 2021-06-30 (×2): qty 4

## 2021-06-30 MED ORDER — CLONIDINE HCL 0.1 MG PO TABS
0.1000 mg | ORAL_TABLET | Freq: Two times a day (BID) | ORAL | Status: DC
  Administered 2021-06-30 – 2021-07-01 (×4): 0.1 mg
  Filled 2021-06-30 (×5): qty 1

## 2021-06-30 MED ORDER — INSULIN ASPART 100 UNIT/ML IJ SOLN: 3.0000 [IU] | INTRAMUSCULAR | Status: DC

## 2021-06-30 MED ORDER — DEXMEDETOMIDINE HCL IN NACL 400 MCG/100ML IV SOLN
0.0000 ug/kg/h | INTRAVENOUS | Status: DC
  Administered 2021-06-30: 0.4 ug/kg/h via INTRAVENOUS
  Filled 2021-06-30: qty 100

## 2021-06-30 MED ORDER — INSULIN DETEMIR 100 UNIT/ML ~~LOC~~ SOLN
12.0000 [IU] | Freq: Two times a day (BID) | SUBCUTANEOUS | Status: DC
  Filled 2021-06-30 (×2): qty 0.12

## 2021-06-30 MED ORDER — DEXTROSE 50 % IV SOLN
25.0000 g | INTRAVENOUS | Status: AC
  Administered 2021-06-30: 25 g via INTRAVENOUS

## 2021-06-30 MED ORDER — QUETIAPINE FUMARATE 100 MG PO TABS
200.0000 mg | ORAL_TABLET | Freq: Two times a day (BID) | ORAL | Status: DC
  Administered 2021-06-30 – 2021-07-12 (×24): 200 mg
  Filled 2021-06-30 (×24): qty 2

## 2021-06-30 NOTE — Progress Notes (Signed)
Patient has failed SBT this morning due to tachycardia, hypertension, tachypnea and agitation.  Propofol has been titrated down and discontinued due to hypertriglyceridemia.  She is maxed out on 400 of fentanyl so Precedex was added. ? ?I spoke with patient's spouse and daughter this morning.  I explained to family that patient did not tolerate pressor support nor titrate down sedations.  She did not follow commands this morning.  We will keep attempting SBT for the next few day but given her poor lung function (COPD and OSA) at baseline, she may remain on the ventilator for a long time.  Her daughter states that if patient can make decision on her own, she would not want to be ventilator dependent for the rest of her life.  Family will continue to discuss among themselves as we keep updating her clinical course.  ?

## 2021-06-30 NOTE — Progress Notes (Signed)
eLink Physician-Brief Progress Note ?Patient Name: Gina Bradley ?DOB: January 09, 1961 ?MRN: [id number] ? ? ?Date of Service ? 06/30/2021  ?HPI/Events of Note ? Patient with hypoglycemia down to BS of 25 mg / dl.  ?eICU Interventions ? D 10 % water gtt ordered at 75 ml / hour. CBG hourly x 4.  ? ? ? ?  ? ?Gina Bradley ?06/30/2021, 4:15 AM ?

## 2021-06-30 NOTE — Progress Notes (Signed)
? ?  Per telemetry, patient has had an elevated HR throughout the day. Maintained sinus rhythm overnight. HR increased to 140s when critical care team attempted SBT and attempted to weak patient off of propofol. Patient is currently maintaining sinus rhythm with a HR between 100-120. Discussed with RN who informed me that patient's HR and BP have been affected by changes in sedation, but patient's HR is between 90-100 even when she is calm and resting.  ? ?I checked the patient's BP while I was in the room, BP was elevated to 162/77. Volume status improved. Echo showed EF 65-70%. I started patient on metoprolol 25 mg BID.  ? ?Jonita Albee, PA-C ?06/30/2021 5:40 PM ? ?

## 2021-06-30 NOTE — Progress Notes (Signed)
RT note. ?Patient placed on SBT 16/8 40%. No labored breathing noted at this time, apt sat 95%. RN aware, RT will continue to monitor ?

## 2021-06-30 NOTE — Progress Notes (Signed)
Nutrition Follow-up ? ?DOCUMENTATION CODES:  ? ?Obesity unspecified ? ?INTERVENTION:  ? ?Resume tube feeds via Cortrak: ?- Vital 1.5 @ 50 ml/hr (1200 ml/day) ?- ProSource TF 45 ml QID ?- Free water flushes of 200 ml q 8 hours ? ?Tube feeding regimen provides 1960 kcal, 125 grams of protein, and 917 ml of H2O. ? ?Total free water with flushes: 1517 ml ? ?NUTRITION DIAGNOSIS:  ? ?Inadequate oral intake related to inability to eat as evidenced by NPO status. ? ?Ongoing, being addressed via TF ? ?GOAL:  ? ?Patient will meet greater than or equal to 90% of their needs ? ?Met via TF ? ?MONITOR:  ? ?Vent status, Labs, Weight trends, TF tolerance, I & O's ? ?REASON FOR ASSESSMENT:  ? ?Ventilator ?  ? ?ASSESSMENT:  ? ?60-year-old female with PMH of T2DM, CHF, COPD, GERD, chronic lymphedema who presented with AMS and required intubation. ? ?03/24 - transferred to [facility] ?03/28 - s/p tracheostomy ?03/29 - Cortrak placed (tip gastric) ? ?Discussed pt with RN and during ICU rounds. Pt admitted with multifocal pneumonia and sepsis. Pt is s/p trach yesterday and had Cortrak placed this morning. Per RN, plan to turn off propofol due to high triglycerides. RN to restart tube feeds at goal rate via Cortrak and will wean D10 infusion. ? ?Current TF orders: Vital 1.5 @ 50 ml/hr, ProSource TF 45 ml QID, free water flushes of 200 ml q 8 hours ? ?Admit weight: 100 kg ?Current weight: 99.7 kg ? ?Pt with moderate pitting edema to BUE and deep pitting edema to BLE. Pt also with moderate pitting facial edema. ? ?Patient is currently on ventilator support via trach ?MV: 10.5 L/min ?Temp (24hrs), Avg:99 ?F (37.2 ?C), Min:97.8 ?F (36.6 ?C), Max:99.9 ?F (37.7 ?C) ? ?Drips: ?Propofol: 19.2 ml/hr (provides 507 kcal daily from lipid) ?Fentanyl ?D10: 75 ml/hr ? ?Medications reviewed and include: colace, IV lasix, SSI q 4 hours, levemir 12 units BID, protonix, miralax, prednisone, spironolactone, IV KCl 10 mEq x 4 runs ? ?Labs reviewed: sodium 147, BUN  41, TG 446, WBC 12.1, hemoglobin 9.3 ?CBG's: 25-179 x 24 hours ? ?UOP: 4775 ml x 24 hours ?I/O's: +4.8 L since admit ? ?Diet Order:   ?Diet Order   ? ?       ?  Diet NPO time specified  Diet effective now       ?  ? ?  ?  ? ?  ? ? ?EDUCATION NEEDS:  ? ?Not appropriate for education at this time ? ?Skin:  Skin Assessment: Reviewed RN Assessment ? ?Last BM:  06/30/21 large type 7 ? ?Height:  ? ?Ht Readings from Last 1 Encounters:  ?06/30/21 5' 5" (1.651 m)  ? ? ?Weight:  ? ?Wt Readings from Last 1 Encounters:  ?06/30/21 99.7 kg  ? ? ?Ideal Body Weight:  56.8 kg ? ?BMI:  Body mass index is 36.58 kg/m?. ? ?Estimated Nutritional Needs:  ? ?Kcal:  1900-2100 ? ?Protein:  115-130 grams ? ?Fluid:  1.9 L/day ? ? ? ?Kate , MS, RD, LDN ?Inpatient Clinical Dietitian ?Please see AMiON for contact information. ? ?

## 2021-06-30 NOTE — Progress Notes (Signed)
RT note. ?Patient flipped back to full support at this time due to ^ HR/^RR, agitation/accessory muscle use. RT will continue to monitor. ?

## 2021-06-30 NOTE — Progress Notes (Signed)
[facility] ADULT ICU REPLACEMENT PROTOCOL ? ? ?The patient does apply for the [facility] Adult ICU Electrolyte Replacment Protocol based on the criteria listed below:  ? ?1.Exclusion criteria: TCTS patients, ECMO patients, and Dialysis patients ?2. Is GFR >/= 30 ml/min? Yes.    ?Patient's GFR today is >60 ?3. Is SCr </= 2? Yes.   ?Patient's SCr is 0.7 mg/dL ?4. Did SCr increase >/= 0.5 in 24 hours? No. ?5.Pt's weight >40kg  Yes.   ?6. Abnormal electrolyte(s):  K 3.5  ?7. Electrolytes replaced per protocol ?8.  Call MD STAT for K+ </= 2.5, Phos </= 1, or Mag </= 1 ?Physician:  Lona Kettle ? ?Gina Bradley 06/30/2021 5:55 AM ? ?

## 2021-06-30 NOTE — Progress Notes (Signed)
PT Cancellation Note ? ?Patient Details ?Name: Gina Bradley ?MRN: [id number] ?DOB: Jul 07, 1960 ? ? ?Cancelled Treatment:    Reason Eval/Treat Not Completed: (P) Patient not medically ready ?Attempting to wean pt from vent and pt with limited responsiveness to painful stimuli, not responsive to verbal or tactile cues. PT to follow back tomorrow to assess appropriateness for Evaluation. ? ?Shaynna Husby B. Beverely Risen PT, DPT ?Acute Rehabilitation Services ?Pager [phone] ?Office [phone] ?  ? ?[name] ?06/30/2021, 9:08 AM ? ? ?

## 2021-06-30 NOTE — Progress Notes (Signed)
? ?Progress Note ? ?Patient Name: Gina Bradley ?Date of Encounter: 06/30/2021 ? ?[facility] Cardiologist: Rozann Lesches, MD  ? ?Subjective  ? ?Patient with trach ?Inpatient Medications  ?  ?Scheduled Meds: ? amiodarone  200 mg Per Tube BID  ? arformoterol  15 mcg Nebulization BID  ? aspirin  81 mg Per Tube Daily  ? atorvastatin  40 mg Per Tube Daily  ? bethanechol  10 mg Per Tube TID  ? budesonide (PULMICORT) nebulizer solution  0.25 mg Nebulization BID  ? chlorhexidine gluconate (MEDLINE KIT)  15 mL Mouth Rinse BID  ? Chlorhexidine Gluconate Cloth  6 each Topical Q0600  ? clonazePAM  1 mg Per Tube Q6H  ? docusate  100 mg Per Tube BID  ? enoxaparin (LOVENOX) injection  55 mg Subcutaneous Q24H  ? feeding supplement (PROSource TF)  45 mL Per Tube QID  ? free water  100 mL Per Tube Q8H  ? furosemide  40 mg Intravenous TID  ? insulin aspart  0-20 Units Subcutaneous Q4H  ? insulin aspart  5 Units Subcutaneous Q4H  ? insulin detemir  24 Units Subcutaneous BID  ? losartan  25 mg Per Tube Daily  ? mouth rinse  15 mL Mouth Rinse 10 times per day  ? oxyCODONE  10 mg Per Tube Q4H  ? pantoprazole sodium  40 mg Per Tube Daily  ? polyethylene glycol  17 g Per Tube Daily  ? predniSONE  10 mg Per Tube Q breakfast  ? QUEtiapine  100 mg Per Tube BID  ? revefenacin  175 mcg Nebulization Daily  ? sodium chloride flush  10-40 mL Intracatheter Q12H  ? spironolactone  50 mg Per Tube BID  ? ?Continuous Infusions: ? sodium chloride 10 mL/hr at 06/30/21 0400  ? dexmedetomidine (PRECEDEX) IV infusion Stopped (06/29/21 1147)  ? dextrose 75 mL/hr at 06/30/21 0439  ? feeding supplement (VITAL 1.5 CAL) Stopped (06/29/21 1438)  ? fentaNYL infusion INTRAVENOUS 175 mcg/hr (06/30/21 0400)  ? potassium chloride    ? propofol (DIPRIVAN) infusion 40 mcg/kg/min (06/30/21 0551)  ? ?PRN Meds: ?sodium chloride, acetaminophen, albuterol, docusate, fentaNYL, hydrALAZINE, midazolam, ondansetron (ZOFRAN) IV, polyethylene glycol, sodium chloride flush   ? ?Vital Signs  ?  ?Vitals:  ? 06/30/21 0500 06/30/21 0530 06/30/21 0550 06/30/21 0600  ?BP: (!) 141/81 (!) 152/70  138/61  ?Pulse: 76 80 75 73  ?Resp: 20 20 20 20   ?Temp:      ?TempSrc:      ?SpO2: 97% 98% 96% 94%  ?Weight: 99.7 kg     ?Height:      ? ? ?Intake/Output Summary (Last 24 hours) at 06/30/2021 0610 ?Last data filed at 06/30/2021 0556 ?Gross per 24 hour  ?Intake 2020.93 ml  ?Output 4775 ml  ?Net -2754.07 ml  ? ?Pt now 4.6 L positive, down from over 6 L + ? ? ?  06/30/2021  ?  5:00 AM 06/29/2021  ?  4:55 AM 06/29/2021  ? 12:10 AM  ?Last 3 Weights  ?Weight (lbs) 219 lb 12.8 oz 233 lb 0.4 oz 233 lb 0.4 oz  ?Weight (kg) 99.7 kg 105.7 kg 105.7 kg  ?   ? ?Telemetry  ?  ?Sinus rhythmPersonally Reviewed ? ?ECG  ? ?No new tracings.  ? ?Physical Exam  ?Pt with trach    Eyes closed   .  ?Neck:Neck is collar ?Cardiac: RRR, no murmurs ?Respiratory: Relatively clear anteriorly  ?GI: Soft, non-distended  ?MS:1-2+ edema LE   1+ uppper  ? ?  Labs  ?  ?High Sensitivity Troponin:   ?Recent Labs  ?Lab 06/22/21 ?0352 06/22/21 ?1610 06/24/21 ?9604 06/24/21 ?5409  ?TROPONINIHS 94* 218* 26* 23*  ?   ?Chemistry ?Recent Labs  ?Lab 06/24/21 ?0357 06/25/21 ?0306 06/25/21 ?1759 06/26/21 ?8119 06/27/21 ?0327 06/27/21 ?1478 06/28/21 ?2956 06/28/21 ?2130 06/29/21 ?8657 06/29/21 ?8469 06/30/21 ?6295  ?NA 136 138   < > 137   < > 143 145   < > 147* 146* 147*  ?K 3.9 3.0*   < > 3.8   < > 3.6 4.1   < > 3.9 3.8 3.5  ?CL 104 104  --  103  --  107 108  --  104  --  101  ?CO2 23 24  --  26  --  29 29  --  32  --  38*  ?GLUCOSE 207* 259*  --  285*  --  308* 296*  --  167*  --  146*  ?BUN 15 14  --  14  --  19 28*  --  45*  --  41*  ?CREATININE 0.76 0.73  --  0.76  --  0.73 0.69  --  0.89  --  0.70  ?CALCIUM 8.2* 7.4*  --  7.0*  --  8.0* 8.0*  --  8.6*  --  7.9*  ?MG 1.9 1.7  --  2.0  --   --   --   --   --   --   --   ?PROT 5.4* 4.6*  --  4.2*  --  4.9*  --   --   --   --   --   ?ALBUMIN 2.4* 1.9*  --  1.6*  --  1.7*  --   --   --   --   --   ?AST 83*  46*  --  48*  --  51*  --   --   --   --   --   ?ALT 100* 60*  --  48*  --  47*  --   --   --   --   --   ?ALKPHOS 92 68  --  64  --  74  --   --   --   --   --   ?BILITOT 0.2* 0.3  --  0.4  --  0.3  --   --   --   --   --   ?GFRNONAA >60 >60  --  >60  --  >60 >60  --  >60  --  >60  ?ANIONGAP 9 10  --  8  --  7 8  --  11  --  8  ? < > = values in this interval not displayed.  ?  ?Lipids  ?Recent Labs  ?Lab 06/29/21 ?0733  ?TRIG 348*  ?  ?Hematology ?Recent Labs  ?Lab 06/28/21 ?0406 06/28/21 ?2841 06/29/21 ?3244 06/29/21 ?0820 06/30/21 ?0102  ?WBC 8.5  --  10.1  --  12.1*  ?RBC 3.00*  --  2.90*  --  2.94*  ?HGB 9.5*   < > 9.5* 9.2* 9.3*  ?HCT 29.5*   < > 29.4* 27.0* 29.9*  ?MCV 98.3  --  101.4*  --  101.7*  ?MCH 31.7  --  32.8  --  31.6  ?MCHC 32.2  --  32.3  --  31.1  ?RDW 16.0*  --  16.3*  --  16.3*  ?PLT 196  --  220  --  205  ? < > = values in this interval not displayed.  ? ?Thyroid No results for input(s): TSH, FREET4 in the last 168 hours.  ?BNP ?Recent Labs  ?Lab 06/24/21 ?2836 06/25/21 ?0306  ?BNP 93.0 89.0  ?  ?DDimer No results for input(s): DDIMER in the last 168 hours.  ? ?Radiology  ?  ?DG Chest Port 1 View ? ?Result Date: 06/29/2021 ?CLINICAL DATA:  Tracheostomy tube placement EXAM: PORTABLE CHEST 1 VIEW COMPARISON:  06/29/2021 FINDINGS: Single frontal view of the chest demonstrates tracheostomy tube overlying tracheal air column tip at level of thoracic inlet. Interval removal of the enteric catheter. Right-sided PICC tip overlies the atriocaval junction. Cardiac silhouette is stable. Slight progression of bibasilar airspace disease since prior study. No effusion or pneumothorax. IMPRESSION: 1. No complication after tracheostomy tube placement. 2. Slight progression of bibasilar airspace disease compatible with pneumonia. Electronically Signed   By: Randa Ngo M.D.   On: 06/29/2021 16:06  ? ?DG CHEST PORT 1 VIEW ? ?Result Date: 06/29/2021 ?CLINICAL DATA:  Shortness of breath.  COPD EXAM: PORTABLE  CHEST 1 VIEW COMPARISON:  Yesterday FINDINGS: Endotracheal tube terminates 4.3 cm above carina. Nasogastric tube terminates at the gastric fundus. Right-sided PICC line tip at high right atrium. Apical lordotic positioning. Midline trachea. Normal heart size for level of inspiration. No pleural effusion or pneumothorax. Left greater than right lower lung predominant airspace disease is not significantly changed. IMPRESSION: No significant change since one day prior. Basilar predominant airspace disease, most consistent with multifocal pneumonia. Electronically Signed   By: Abigail Miyamoto M.D.   On: 06/29/2021 08:23   ? ?Cardiac Studies  ? ?Echocardiogram: 06/26/21  ?1. Left ventricular ejection fraction, by estimation, is 65 to 70%. The  ?left ventricle has normal function. The left ventricle has no regional  ?wall motion abnormalities. Left ventricular diastolic parameters are  ?indeterminate.  ? 2. Right ventricular systolic function is normal. The right ventricular  ?size is normal. Tricuspid regurgitation signal is inadequate for assessing  ?PA pressure.  ? 3. The mitral valve is normal in structure. No evidence of mitral valve  ?regurgitation.  ? 4. The aortic valve was not well visualized. Aortic valve regurgitation  ?is not visualized. No aortic stenosis is present.  ? ?Patient Profile  ?   ?61 y.o. female w/ PMH of PSVT, Type 2 DM, HTN, HLD, lymphedema and COPD who is currently admitted with acute hypoxic respiratory failure in the setting of PNA. Cardiology consulted due to SVT.  ? ?Assessment & Plan  ?  ?SVT ?- Known history of SVT prior to this admission (on Toprol-XL 137m daily) but had recurrence on admission and BP did not allow for the use of AV nodal blocking agents.  ?- Patient was started on Amiodarone for rhythm suppression     ?On PO 200 bid now   No SVT   Has had some ST   Can add metoprolol       IWill probably stop amio soon   ? ?Acute on Chronic Diastolic CHF  ?- Echo this admission showed EF  65-70% ?- History of grade I diastolic dysfunction on echo in 08/2020  ?- Continue Losartan 50 mg daily  ?- Continue lasix 40 mg IV TID (currently net +6 L fluids)  I increased to 80 IV tid given marked v

## 2021-06-30 NOTE — Progress Notes (Addendum)
? ?NAME:  Gina Bradley, MRN:  [id number], DOB:  1960-06-08, LOS: 8 ?ADMISSION DATE:  06/22/2021, CONSULTATION DATE:  3/21 ?REFERRING MD:  Wynetta Emery, [facility], CHIEF COMPLAINT:  acute resp failure/ fever wth AMS and SVT ? ?History of Present Illness:  ?61 yo female former smoker presented to [facility] ER on 3/21 with AMS, fever.  Found to have multifocal pneumonia and required intubation and pressors.  Transferred to [facility] for further management. ? ?Pertinent  Medical History  ?Anemia, Back pain, Spinal stenosis COPD, DM type 2, HTN, GERD, CHF, Lymphedema, Neuropathy, SVT ? ?Significant Hospital Events: ?Including procedures, antibiotic start and stop dates in addition to other pertinent events   ?3/21 Admit to [facility], intubated, start pressors, cardiology consult for SVT, start amiodarone ?3/24 transfer to [facility] ? ?Studies:  ?CT head 3/21 >> mild cerebral atrophy, small-vessel disease and atrophic ventriculomegaly with unremarkable cerebellum and brainstem ?CT angio chest 3/21 >> subacute 3rd/4th rib fractures on Rt, Lt anterior subacute rib fx, multifocal pneumonia with ATX ?CT abd/pelvis 3/21 >> endometrium thickening. Remote L4 compression fracture with spurs and displaced inferior endplate causing biforaminal impingement at L4-5. Subacute right sacral insufficiency fracture with fat stranding and callus. Healing right obturator ring ?fractures. Healing left third through fifth lumbar transverse process fractures with callus. Nondisplaced left pubic body fracture. ? ?Interim History / Subjective:  ? ?Patient is intubated and sedated.  Not waking up to physical or verbal stimulation. ? ?Afebrile ?75 fentanyl, 40 propofol ?Peep 8, 50 fio2 ? ?Objective   ?Blood pressure (!) 106/59, pulse 71, temperature 99.1 ?F (37.3 ?C), temperature source Oral, resp. rate 20, height (S) 5\' 5"  (1.651 m), weight 99.7 kg, last menstrual period 01/05/2015, SpO2 96 %. ?   ?Vent Mode: PRVC ?FiO2 (%):  [40 %-60 %] 50 % ?Set Rate:  [20 bmp] 20 bmp ?Vt  Set:  [460 mL] 460 mL ?PEEP:  [8 cmH20] 8 cmH20 ?Pressure Support:  [8 cmH20] 8 cmH20 ?Plateau Pressure:  [17 cmH20-22 cmH20] 22 cmH20  ? ?Intake/Output Summary (Last 24 hours) at 06/30/2021 0733 ?Last data filed at 06/30/2021 0700 ?Gross per 24 hour  ?Intake 2371.26 ml  ?Output 4775 ml  ?Net -2403.74 ml  ? ? ?Filed Weights  ? 06/29/21 0010 06/29/21 0455 06/30/21 0500  ?Weight: 105.7 kg 105.7 kg 99.7 kg  ? ?   ?Examination: ? ?General -sedated, chronically ill appearance ?Eyes -eyes closed.  ?ENT - trach in place, site appear clean ?Cardiac -regular rate and rhythm ?Chest - no wheezing ?Abdomen - soft, non tender, + bowel sounds ?Extremities - trace to 1+ edema ?Skin - areas of scattered ecchymosis ?Neuro -sedated.  Not waking up. ? ?Resolved Problems:  ?Septic shock, Elevated troponin from demand ischemia ? ?Assessment & Plan:  ?Acute on chronic hypoxic/hypercapnic respiratory failure from multifocal bacterial community acquired pneumonia (POA). ?COPD with acute exacerbation. ?- goal SpO2 > 90% ?- pulmicort, brovana, yupelri ?- prn albuterol ?- Back to home does Prednisone 10 mg.  ?- Continue to wean off of sedation. SBT trial  ? ?Community acquired pneumonia vs aspiration PNA. ?Monitor off antibiotics. Pending repeat respiratory culture  ? ?Paroxysmal SVT. ?Acute on chronic diastolic CHF, exacerbated by SVT ?HTN, HLD, CAD ?- Continue enteral amiodarone 200 mg BID. Can be transition to BB. Defer to cardiology ?- Good urine output overnight. Bicarb trending up.  Volume status improves on physical exam and chest x-ray.  Cut back diuresis to Lasix 40 mg BID ?- Hold losartan given hypotension on Propofol  ?- Continue  ASA, lipitor and aldactone ?- hold outpt isosorbide.  ? ?Acute metabolic encephalopath ?Hx of peripheral neuropathy, back pain, chronic opiate use. ?- UDS positive for barbiturates ?- Cont Fentanyl and Propofol. Wean down as tolerated ?- Continue PO Oxy, Seroquel and Klonopin   ?- RASS goal 0 to -1 ?- hold  outpt neurontin, robaxin, norco ? ?Anemia of critical illness. ?- f/u CBC ?- transfuse for Hb < 7 ? ?DM type 2 poorly controlled with steroid induced hyperglycemia. ?CBG 25 overnight. Fasting 90 this morning. Still NPO without cortrak. D10 infusion for now until Cortrak placed.  We will hold morning insulin until feeding. ?- Decrease Levemir to 15 units BID ?- Stop tube feed coverage. ?- SSI ? ?Endometrial thickening on CT scan. ?- will need outpt f/u with Gyn ? ?Rt subacute inferior pubic ramus fracture. ?- seen in ED on 06/06/21 and scheduled for outpt orthopedic follow up ? ?Best Practice (right click and "Reselect all SmartList Selections" daily)  ?DVT: lovenox ?SUP: protonix ?Diet: tube feeds ?Lines: Rt PICC 3/23 ?Foley: Yes. 3 days of bethanechol and voiding trial again ?Goals of care: Full code ? ?Labs   ? ? ?  Latest Ref Rng & Units 06/30/2021  ?  4:52 AM 06/29/2021  ?  8:20 AM 06/29/2021  ?  7:33 AM  ?CMP  ?Glucose 70 - 99 mg/dL 146    167    ?BUN 6 - 20 mg/dL 41    45    ?Creatinine 0.44 - 1.00 mg/dL 0.70    0.89    ?Sodium 135 - 145 mmol/L 147   146   147    ?Potassium 3.5 - 5.1 mmol/L 3.5   3.8   3.9    ?Chloride 98 - 111 mmol/L 101    104    ?CO2 22 - 32 mmol/L 38    32    ?Calcium 8.9 - 10.3 mg/dL 7.9    8.6    ? ? ? ?  Latest Ref Rng & Units 06/30/2021  ?  4:52 AM 06/29/2021  ?  8:20 AM 06/29/2021  ?  4:29 AM  ?CBC  ?WBC 4.0 - 10.5 K/uL 12.1    10.1    ?Hemoglobin 12.0 - 15.0 g/dL 9.3   9.2   9.5    ?Hematocrit 36.0 - 46.0 % 29.9   27.0   29.4    ?Platelets 150 - 400 K/uL 205    220    ? ? ?ABG ?   ?Component Value Date/Time  ? PHART 7.423 06/29/2021 0820  ? PCO2ART 57.3 (H) 06/29/2021 0820  ? PO2ART 92 06/29/2021 0820  ? HCO3 37.4 (H) 06/29/2021 0820  ? TCO2 39 (H) 06/29/2021 0820  ? ACIDBASEDEF 0.6 06/24/2021 1855  ? O2SAT 97 06/29/2021 0820  ? ? ?CBG (last 3)  ?Recent Labs  ?  06/30/21 ?0348 06/30/21 ?0457 06/30/21 ?0625  ?GLUCAP 177* 108* 90  ? ? ?Critical care time:   ? ?Gaylan Gerold, DO ?

## 2021-06-30 NOTE — Procedures (Signed)
Cortrak  Person Inserting Tube:  Treyvin Glidden D, RD Tube Type:  Cortrak - 43 inches Tube Size:  10 Tube Location:  Left nare Secured by: Bridle Technique Used to Measure Tube Placement:  Marking at nare/corner of mouth Cortrak Secured At:  67 cm  Cortrak Tube Team Note:  Consult received to place a Cortrak feeding tube.   X-ray is required, abdominal x-ray has been ordered by the Cortrak team. Please confirm tube placement before using the Cortrak tube.   If the tube becomes dislodged please keep the tube and contact the Cortrak team at [email or web address] (password [email or web address]) for replacement.  If after hours and replacement cannot be delayed, place a NG tube and confirm placement with an abdominal x-ray.    Gina Bradley, RD, LDN Clinical Dietitian RD pager # available in AMION  After hours/weekend pager # available in AMION   

## 2021-07-01 ENCOUNTER — Inpatient Hospital Stay (HOSPITAL_COMMUNITY): Payer: 59

## 2021-07-01 DIAGNOSIS — G9341 Metabolic encephalopathy: Secondary | ICD-10-CM | POA: Diagnosis not present

## 2021-07-01 DIAGNOSIS — J189 Pneumonia, unspecified organism: Secondary | ICD-10-CM | POA: Diagnosis not present

## 2021-07-01 DIAGNOSIS — J9602 Acute respiratory failure with hypercapnia: Secondary | ICD-10-CM | POA: Diagnosis not present

## 2021-07-01 DIAGNOSIS — J9601 Acute respiratory failure with hypoxia: Secondary | ICD-10-CM | POA: Diagnosis not present

## 2021-07-01 DIAGNOSIS — A419 Sepsis, unspecified organism: Secondary | ICD-10-CM | POA: Diagnosis not present

## 2021-07-01 LAB — BASIC METABOLIC PANEL
Anion gap: 8 (ref 5–15)
BUN: 43 mg/dL — ABNORMAL HIGH (ref 6–20)
CO2: 39 mmol/L — ABNORMAL HIGH (ref 22–32)
Calcium: 8.3 mg/dL — ABNORMAL LOW (ref 8.9–10.3)
Chloride: 101 mmol/L (ref 98–111)
Creatinine, Ser: 0.81 mg/dL (ref 0.44–1.00)
GFR, Estimated: >60 mL/min (ref >60)
Glucose, Bld: 170 mg/dL — ABNORMAL HIGH (ref 70–99)
Potassium: 4.1 mmol/L (ref 3.5–5.1)
Sodium: 148 mmol/L — ABNORMAL HIGH (ref 135–145)

## 2021-07-01 LAB — CBC
HCT: 30.9 % — ABNORMAL LOW (ref 36.0–46.0)
Hemoglobin: 9.6 g/dL — ABNORMAL LOW (ref 12.0–15.0)
MCH: 31.1 pg (ref 26.0–34.0)
MCHC: 31.1 g/dL (ref 30.0–36.0)
MCV: 100 fL (ref 80.0–100.0)
Platelets: 228 10*3/uL (ref 150–400)
RBC: 3.09 MIL/uL — ABNORMAL LOW (ref 3.87–5.11)
RDW: 16.5 % — ABNORMAL HIGH (ref 11.5–15.5)
WBC: 16.1 10*3/uL — ABNORMAL HIGH (ref 4.0–10.5)
nRBC: 0.4 % — ABNORMAL HIGH (ref 0.0–0.2)

## 2021-07-01 LAB — GLUCOSE, CAPILLARY
Glucose-Capillary: 163 mg/dL — ABNORMAL HIGH (ref 70–99)
Glucose-Capillary: 210 mg/dL — ABNORMAL HIGH (ref 70–99)
Glucose-Capillary: 282 mg/dL — ABNORMAL HIGH (ref 70–99)
Glucose-Capillary: 134 mg/dL — ABNORMAL HIGH (ref 70–99)
Glucose-Capillary: 108 mg/dL — ABNORMAL HIGH (ref 70–99)
Glucose-Capillary: 138 mg/dL — ABNORMAL HIGH (ref 70–99)
Glucose-Capillary: 162 mg/dL — ABNORMAL HIGH (ref 70–99)

## 2021-07-01 LAB — TRIGLYCERIDES: Triglycerides: 316 mg/dL — ABNORMAL HIGH (ref <150)

## 2021-07-01 MED ORDER — INSULIN DETEMIR 100 UNIT/ML ~~LOC~~ SOLN
15.0000 [IU] | Freq: Two times a day (BID) | SUBCUTANEOUS | Status: DC
  Administered 2021-07-01 – 2021-07-07 (×13): 15 [IU] via SUBCUTANEOUS
  Filled 2021-07-01 (×14): qty 0.15

## 2021-07-01 MED ORDER — ACETAZOLAMIDE 250 MG PO TABS
500.0000 mg | ORAL_TABLET | Freq: Once | ORAL | Status: AC
  Administered 2021-07-01: 500 mg
  Filled 2021-07-01: qty 2

## 2021-07-01 MED ORDER — FREE WATER
200.0000 mL | Freq: Four times a day (QID) | Status: DC
  Administered 2021-07-01 – 2021-07-08 (×28): 200 mL

## 2021-07-01 MED ORDER — METOPROLOL TARTRATE 50 MG PO TABS
50.0000 mg | ORAL_TABLET | Freq: Two times a day (BID) | ORAL | Status: DC
  Administered 2021-07-01 – 2021-07-03 (×4): 50 mg via NASOGASTRIC
  Filled 2021-07-01 (×5): qty 1

## 2021-07-01 MED ORDER — DEXMEDETOMIDINE HCL IN NACL 400 MCG/100ML IV SOLN
0.0000 ug/kg/h | INTRAVENOUS | Status: AC
  Administered 2021-07-02 (×2): 0.4 ug/kg/h via INTRAVENOUS
  Administered 2021-07-03: 0.2 ug/kg/h via INTRAVENOUS
  Administered 2021-07-03 (×2): 0.5 ug/kg/h via INTRAVENOUS
  Filled 2021-07-01 (×6): qty 100

## 2021-07-01 MED ORDER — FUROSEMIDE 10 MG/ML IJ SOLN
40.0000 mg | Freq: Two times a day (BID) | INTRAMUSCULAR | Status: DC
Start: 2021-07-01 — End: 2021-07-02
  Administered 2021-07-01 – 2021-07-02 (×2): 40 mg via INTRAVENOUS
  Filled 2021-07-01 (×3): qty 4

## 2021-07-01 MED ORDER — PHENYLEPHRINE HCL-NACL 20-0.9 MG/250ML-% IV SOLN: 0.0000 ug/min | INTRAVENOUS | Status: DC

## 2021-07-01 MED ORDER — PHENYLEPHRINE HCL-NACL 20-0.9 MG/250ML-% IV SOLN
INTRAVENOUS | Status: AC
  Administered 2021-07-01: 40 ug/min via INTRAVENOUS
  Filled 2021-07-01: qty 250

## 2021-07-01 MED ORDER — SODIUM CHLORIDE 0.9 % IV BOLUS
500.0000 mL | Freq: Once | INTRAVENOUS | Status: DC
Start: 2021-07-01 — End: 2021-07-01

## 2021-07-01 MED ORDER — SODIUM CHLORIDE 0.9 % IV BOLUS
300.0000 mL | Freq: Once | INTRAVENOUS | Status: AC
  Administered 2021-07-01: 300 mL via INTRAVENOUS

## 2021-07-01 MED ORDER — FUROSEMIDE 10 MG/ML IJ SOLN
40.0000 mg | Freq: Every day | INTRAMUSCULAR | Status: DC
  Administered 2021-07-01: 40 mg via INTRAVENOUS
  Filled 2021-07-01: qty 4

## 2021-07-01 NOTE — Progress Notes (Signed)
Patient seen today by trach team for consult. No education needed at this time.  All the necessary equipment is at bedside.  Will continue to follow for progression.  The patient is still on full ventilatory support. ?

## 2021-07-01 NOTE — Progress Notes (Signed)
SLP Cancellation Note ? ?Patient Details ?Name: Gina Bradley ?MRN: [id number] ?DOB: 1960-05-13 ? ? ?Cancelled treatment:       Reason Eval/Treat Not Completed: Patient not medically ready ? ? ?Miquel Lamson, Riley Nearing ?07/01/2021, 8:04 AM ?

## 2021-07-01 NOTE — Progress Notes (Signed)
eLink Physician-Brief Progress Note ?Patient Name: Gina Bradley ?DOB: July 27, 1960 ?MRN: [id number] ? ? ?Date of Service ? 07/01/2021  ?HPI/Events of Note ? SBP 58 mmHg. Saturation maintained. Breath sounds are symmetrical and unchanged from baseline.  ?eICU Interventions ? Propofol and Fentanyl gtt discontinued, NS 300 ml iv bolus x 1, Phenylephrine gtt ordered, Low level Precedex substituted for better anxiolysis without requiring high doses..  ? ? ? ?  ? ?Frederik Pear ?07/01/2021, 10:39 PM ?

## 2021-07-01 NOTE — Progress Notes (Addendum)
? ?Progress Note ? ?Patient Name: Gina Bradley ?Date of Encounter: 07/01/2021 ? ?[facility] Cardiologist: Rozann Lesches, MD  ? ?Subjective  ? ?Patient with trach ? ?Inpatient Medications  ?  ?Scheduled Meds: ? acetaZOLAMIDE  500 mg Per Tube Once  ? amiodarone  200 mg Per Tube BID  ? arformoterol  15 mcg Nebulization BID  ? aspirin  81 mg Per Tube Daily  ? atorvastatin  40 mg Per Tube Daily  ? bethanechol  10 mg Per Tube TID  ? budesonide (PULMICORT) nebulizer solution  0.25 mg Nebulization BID  ? chlorhexidine gluconate (MEDLINE KIT)  15 mL Mouth Rinse BID  ? Chlorhexidine Gluconate Cloth  6 each Topical Q0600  ? clonazePAM  1 mg Per Tube Q6H  ? cloNIDine  0.1 mg Per Tube BID  ? docusate  100 mg Per Tube BID  ? enoxaparin (LOVENOX) injection  50 mg Subcutaneous Q24H  ? feeding supplement (PROSource TF)  45 mL Per Tube QID  ? free water  200 mL Per Tube Q6H  ? furosemide  40 mg Intravenous BID  ? insulin aspart  0-20 Units Subcutaneous Q4H  ? insulin detemir  15 Units Subcutaneous BID  ? losartan  50 mg Per Tube Daily  ? mouth rinse  15 mL Mouth Rinse 10 times per day  ? metoprolol tartrate  25 mg Per NG tube BID  ? oxyCODONE  10 mg Per Tube Q4H  ? pantoprazole sodium  40 mg Per Tube Daily  ? polyethylene glycol  17 g Per Tube Daily  ? predniSONE  10 mg Per Tube Q breakfast  ? QUEtiapine  200 mg Per Tube BID  ? revefenacin  175 mcg Nebulization Daily  ? sodium chloride flush  10-40 mL Intracatheter Q12H  ? spironolactone  50 mg Per Tube BID  ? ?Continuous Infusions: ? sodium chloride 10 mL/hr at 07/01/21 0800  ? dexmedetomidine (PRECEDEX) IV infusion Stopped (06/30/21 1452)  ? feeding supplement (VITAL 1.5 CAL) 1,000 mL (06/30/21 1209)  ? fentaNYL infusion INTRAVENOUS 150 mcg/hr (07/01/21 0800)  ? propofol (DIPRIVAN) infusion 10 mcg/kg/min (07/01/21 0800)  ? ?PRN Meds: ?sodium chloride, acetaminophen, albuterol, fentaNYL, hydrALAZINE, midazolam, ondansetron (ZOFRAN) IV, sodium chloride flush  ? ?Vital Signs   ?  ?Vitals:  ? 07/01/21 0830 07/01/21 0845 07/01/21 0850 07/01/21 0900  ?BP: (!) 176/129 (!) 183/104  (!) 189/91  ?Pulse: (!) 131 (!) 135 (!) 135 (!) 134  ?Resp: (!) 29 (!) 27 (!) 32 (!) 29  ?Temp:      ?TempSrc:      ?SpO2: 91% 91% 92% 92%  ?Weight:      ?Height:      ? ? ?Intake/Output Summary (Last 24 hours) at 07/01/2021 1033 ?Last data filed at 07/01/2021 0800 ?Gross per 24 hour  ?Intake 4417.41 ml  ?Output 4100 ml  ?Net 317.41 ml  ? ? ?  07/01/2021  ?  3:58 AM 06/30/2021  ?  5:00 AM 06/29/2021  ?  4:55 AM  ?Last 3 Weights  ?Weight (lbs) 220 lb 3.8 oz 219 lb 12.8 oz 233 lb 0.4 oz  ?Weight (kg) 99.9 kg 99.7 kg 105.7 kg  ?   ? ?Telemetry  ?  ?Sinus rhythm. HR was between 100-105 overnight. Now between 110-130 - Personally Reviewed ? ?ECG  ?  ?No new tracings since 2/23 - Personally Reviewed ? ?Physical Exam  ? ?GEN: Agitated. Trach in place. Sedated  ?Neck: No JVD. Trach in place ?Cardiac: Regular rhythm, tachycardic,  no murmurs, rubs, or gallops. ?Respiratory: Coarse ventilator breath sounds ?GI: Soft, nontender, non-distended  ?MS: Trace edema  ?Neuro:  Sedated, agitated ?Psych: Unable to assess  ? ?Labs  ?  ?High Sensitivity Troponin:   ?Recent Labs  ?Lab 06/22/21 ?0352 06/22/21 ?1937 06/24/21 ?9024 06/24/21 ?0973  ?TROPONINIHS 94* 218* 26* 23*  ?   ?Chemistry ?Recent Labs  ?Lab 06/25/21 ?0306 06/25/21 ?1759 06/26/21 ?5329 06/27/21 ?0327 06/27/21 ?9242 06/28/21 ?6834 06/29/21 ?1962 06/29/21 ?2297 06/30/21 ?9892 07/01/21 ?1194  ?NA 138   < > 137   < > 143   < > 147* 146* 147* 148*  ?K 3.0*   < > 3.8   < > 3.6   < > 3.9 3.8 3.5 4.1  ?CL 104  --  103  --  107   < > 104  --  101 101  ?CO2 24  --  26  --  29   < > 32  --  38* 39*  ?GLUCOSE 259*  --  285*  --  308*   < > 167*  --  146* 170*  ?BUN 14  --  14  --  19   < > 45*  --  41* 43*  ?CREATININE 0.73  --  0.76  --  0.73   < > 0.89  --  0.70 0.81  ?CALCIUM 7.4*  --  7.0*  --  8.0*   < > 8.6*  --  7.9* 8.3*  ?MG 1.7  --  2.0  --   --   --   --   --   --   --   ?PROT  4.6*  --  4.2*  --  4.9*  --   --   --   --   --   ?ALBUMIN 1.9*  --  1.6*  --  1.7*  --   --   --   --   --   ?AST 46*  --  48*  --  51*  --   --   --   --   --   ?ALT 60*  --  48*  --  47*  --   --   --   --   --   ?ALKPHOS 68  --  64  --  74  --   --   --   --   --   ?BILITOT 0.3  --  0.4  --  0.3  --   --   --   --   --   ?GFRNONAA >60  --  >60  --  >60   < > >60  --  >60 >60  ?ANIONGAP 10  --  8  --  7   < > 11  --  8 8  ? < > = values in this interval not displayed.  ?  ?Lipids  ?Recent Labs  ?Lab 07/01/21 ?1740  ?TRIG 316*  ?  ?Hematology ?Recent Labs  ?Lab 06/29/21 ?8144 06/29/21 ?0820 06/30/21 ?8185 07/01/21 ?6314  ?WBC 10.1  --  12.1* 16.1*  ?RBC 2.90*  --  2.94* 3.09*  ?HGB 9.5* 9.2* 9.3* 9.6*  ?HCT 29.4* 27.0* 29.9* 30.9*  ?MCV 101.4*  --  101.7* 100.0  ?MCH 32.8  --  31.6 31.1  ?MCHC 32.3  --  31.1 31.1  ?RDW 16.3*  --  16.3* 16.5*  ?PLT 220  --  205 228  ? ?Thyroid No results for input(s): TSH, FREET4 in the  last 168 hours.  ?BNP ?Recent Labs  ?Lab 06/25/21 ?0306  ?BNP 89.0  ?  ?DDimer No results for input(s): DDIMER in the last 168 hours.  ? ?Radiology  ?  ?DG CHEST PORT 1 VIEW ? ?Result Date: 07/01/2021 ?CLINICAL DATA:  Shortness of breath. Acute respiratory failure with hypoxia and hypercarbia. EXAM: PORTABLE CHEST 1 VIEW COMPARISON:  Prior chest radiographs 06/29/2021. FINDINGS: A tracheostomy tube terminates just below the level of the clavicular heads. An enteric tube passes below the level of the left hemidiaphragm with tip excluded from the field of view. Unchanged position of a right-sided PICC with tip projecting at the level of the superior cavoatrial junction. The cardiomediastinal silhouette is unchanged. Bibasilar airspace disease, similar to slightly improved as compared to the prior chest radiograph of 06/29/2021. IMPRESSION: Bibasilar airspace disease, similar to slightly improved as compared to the prior chest radiograph of 06/29/2021. Position of lines and tubes, as described.  Electronically Signed   By: Kellie Simmering D.O.   On: 07/01/2021 08:25  ? ?DG Chest Port 1 View ? ?Result Date: 06/29/2021 ?CLINICAL DATA:  Tracheostomy tube placement EXAM: PORTABLE CHEST 1 VIEW COMPARISON:  06/29/2021 FINDINGS: Single frontal view of the chest demonstrates tracheostomy tube overlying tracheal air column tip at level of thoracic inlet. Interval removal of the enteric catheter. Right-sided PICC tip overlies the atriocaval junction. Cardiac silhouette is stable. Slight progression of bibasilar airspace disease since prior study. No effusion or pneumothorax. IMPRESSION: 1. No complication after tracheostomy tube placement. 2. Slight progression of bibasilar airspace disease compatible with pneumonia. Electronically Signed   By: Randa Ngo M.D.   On: 06/29/2021 16:06  ? ?DG Abd Portable 1V ? ?Result Date: 06/30/2021 ?CLINICAL DATA:  Feeding tube placement EXAM: PORTABLE ABDOMEN - 1 VIEW COMPARISON:  None. FINDINGS: Soft feeding tube tip enters the stomach in has its tip in the antrum. IMPRESSION: Soft feeding tube tip in the antrum of the stomach. Electronically Signed   By: Nelson Chimes M.D.   On: 06/30/2021 10:30   ? ?Cardiac Studies  ? ?Echocardiogram: 06/26/21  ?1. Left ventricular ejection fraction, by estimation, is 65 to 70%. The  ?left ventricle has normal function. The left ventricle has no regional  ?wall motion abnormalities. Left ventricular diastolic parameters are  ?indeterminate.  ? 2. Right ventricular systolic function is normal. The right ventricular  ?size is normal. Tricuspid regurgitation signal is inadequate for assessing  ?PA pressure.  ? 3. The mitral valve is normal in structure. No evidence of mitral valve  ?regurgitation.  ? 4. The aortic valve was not well visualized. Aortic valve regurgitation  ?is not visualized. No aortic stenosis is present.  ? ?Patient Profile  ?   ?61 y.o. female with a PMH of PSVT, type 2 DM, HTN, HLD, lymphedema, COPD who is currently admitted with  acute hypoxic respiratory failure in the setting of PNA. Cardiology consulted due to SVT  ? ?Assessment & Plan  ? ?SVT ?- Known history of SVT prior to this admission (on Toprol-XL 157m daily) but had recurre

## 2021-07-01 NOTE — Progress Notes (Signed)
RT ntoe. ?Patient on SBT 8/8 40% sat 92% with labored breathing noted/agitation. RT will continue to monitor.  ?

## 2021-07-01 NOTE — Evaluation (Signed)
Physical Therapy Evaluation ?Patient Details ?Name: Gina Bradley ?MRN: [id number] ?DOB: 15-Apr-1960 ?Today's Date: 07/01/2021 ? ?History of Present Illness ? 61 yo female former smoker presented to [facility] ER on 3/21 with AMS, fever.  Found to have multifocal pneumonia and required intubation and pressors.  Transferred to [facility] 06/25/21 for treatment of acute on chronic hypoxic/hypercapnic respiratory failure from PNA, COPD with acute COPD, paroxysmal SVT, acute on chronic diastolic CHF, and acute metabolic encephalopathy in presence of peripheral neuropathy, back pain and chronic opiate use. PMH: CAD, smoker, heart failure, GERD, type 2 DM, hypertension, SVT, peripheral neuropathy, steroid dependent COPD, chronic pain history of DVT, chronic lymphedema, spinal stenosis, was recently diagnosed with compression deformity of the L4 vertebrae and subacute fracture of the right inferior pubic rami seen in the ED on 06/06/2021 after fall.  ?Clinical Impression ? Pt weaning from vent. Limited Evaluation due to level of consciousness, however husband present to provide PLOF and home set up information. Husband reports pt was able to ambulate limited community distances however has had a decline in function over the last 6-8 weeks pt leaving home very infrequently and fall 4 weeks ago that limited her mobility from bed to couch to bathroom. Pt lives with husband, step-daughter and son in single story home with 1 step to enter. Pt responds to her name 2/5 times by turning head in direction of therapist and raising eyebrows. Pt does not open eyes. Pt B UE PROM WFL, with L hip flexion to 90 degrees grimace, knee and ankle PROM WFL, R LE PROM WFL. PT will follow back for more thorough evaluation as arousal improves. Currently recommending LTACH with PT at discharge.  ?   ? ?Recommendations for follow up therapy are one component of a multi-disciplinary discharge planning process, led by the attending physician.  Recommendations may be  updated based on patient status, additional functional criteria and insurance authorization. ? ?Follow Up Recommendations Long-term institutional care without follow-up therapy (with PT) ? ?  ?Assistance Recommended at Discharge Intermittent Supervision/Assistance  ?Patient can return home with the following ? A lot of help with walking and/or transfers;A lot of help with bathing/dressing/bathroom;Assistance with cooking/housework;Assistance with feeding;Direct supervision/assist for medications management;Direct supervision/assist for financial management;Assist for transportation;Help with stairs or ramp for entrance ? ?  ?Equipment Recommendations BSC/3in1;Wheelchair (measurements PT);Wheelchair cushion (measurements PT);Hospital bed  ?Recommendations for Other Services ? OT consult  ?  ?Functional Status Assessment Patient has had a recent decline in their functional status and demonstrates the ability to make significant improvements in function in a reasonable and predictable amount of time.  ? ?  ?Precautions / Restrictions Precautions ?Precautions: Fall ?Precaution Comments: hx of fall with L4 compression fx and ?Restrictions ?Weight Bearing Restrictions: No  ? ?  ? ? ? ? ? ?Pertinent Vitals/Pain Pain Assessment ?Pain Assessment: Faces ?Faces Pain Scale: Hurts a little bit ?Pain Location: generalized with PROM, especially with L hip ?Pain Descriptors / Indicators: Grimacing, Guarding ?Pain Intervention(s): Limited activity within patient's tolerance, Monitored during session, Repositioned  ? ? ?Home Living Family/patient expects to be discharged to:: Private residence ?Living Arrangements: Spouse/significant other ?Available Help at Discharge: Family;Available PRN/intermittently ?Type of Home: House ?Home Access: Stairs to enter ?  ?Entrance Stairs-Number of Steps: 1 ?  ?Home Layout: One level ?Home Equipment: Other (comment) (hemi walker) ?   ?  ?Prior Function Prior Level of Function : Needs assist ?  ?  ?   ?Physical Assist : Mobility (physical);ADLs (physical) ?Mobility (physical): Gait;Stairs ?ADLs (  physical): IADLs ?Mobility Comments: husband reports difficulty with ambulation last 6-8 weeks including fall resulting in pelvic fracture ?ADLs Comments: husband reports pt performing bird baths, and requiring assist for dressing last 6 weeks ?  ? ? ?   ?Extremity/Trunk Assessment  ? Upper Extremity Assessment ?Upper Extremity Assessment: Difficult to assess due to impaired cognition (PROM [facility]) ?  ? ?Lower Extremity Assessment ?Lower Extremity Assessment: Difficult to assess due to impaired cognition (R LE PROM WFL, L LE increased discomfort with hip flexion) ?  ? ?   ?Communication  ? Communication: Tracheostomy  ?Cognition Arousal/Alertness: Lethargic ?Behavior During Therapy: Restless ?Overall Cognitive Status: Impaired/Different from baseline ?  ?  ?  ?  ?  ?  ?  ?  ?  ?  ?  ?  ?  ?  ?  ?  ?General Comments: pt responds to name 2/5 times, with turn of head and elevation of eyebrows ?  ?  ? ?  ?General Comments General comments (skin integrity, edema, etc.): HR sustained in 120s, BP 140/71 weaning from vent, SBT 8/8 40% sat 99%O2 ? ?  ?Exercises General Exercises - Upper Extremity ?Shoulder Flexion: PROM, Both ?Shoulder Horizontal ABduction: PROM, Both ?Shoulder Horizontal ADduction: PROM, Both ?Elbow Flexion: PROM, Both ?Elbow Extension: PROM, Both ?Wrist Flexion: PROM, Both ?Wrist Extension: PROM, Both ?Digit Composite Flexion: PROM, Both ?Composite Extension: PROM, Both ?General Exercises - Lower Extremity ?Ankle Circles/Pumps: PROM, Both ?Heel Slides: PROM, Both ?Hip ABduction/ADduction: PROM, Both ?Straight Leg Raises: PROM, Both  ? ?Assessment/Plan  ?  ?PT Assessment Patient needs continued PT services  ?PT Problem List Decreased strength;Decreased activity tolerance;Decreased balance;Decreased mobility;Decreased coordination;Decreased cognition;Decreased safety awareness;Decreased knowledge of  precautions;Cardiopulmonary status limiting activity;Impaired sensation;Decreased skin integrity;Pain ? ?   ?  ?PT Treatment Interventions DME instruction;Gait training;Stair training;Functional mobility training;Therapeutic activities;Therapeutic exercise;Balance training;Cognitive remediation;Patient/family education   ? ?PT Goals (Current goals can be found in the Care Plan section)  ?Acute Rehab PT Goals ?Patient Stated Goal: none stated ?PT Goal Formulation: With patient ?Time For Goal Achievement: 07/16/21 ?Potential to Achieve Goals: Fair ? ?  ?Frequency Min 2X/week ?  ? ? ?   ?AM-PAC PT "6 Clicks" Mobility  ?Outcome Measure Help needed turning from your back to your side while in a flat bed without using bedrails?: Total ?Help needed moving from lying on your back to sitting on the side of a flat bed without using bedrails?: Total ?Help needed moving to and from a bed to a chair (including a wheelchair)?: Total ?Help needed standing up from a chair using your arms (e.g., wheelchair or bedside chair)?: Total ?Help needed to walk in hospital room?: Total ?Help needed climbing 3-5 steps with a railing? : Total ?6 Click Score: 6 ? ?  ?End of Session Equipment Utilized During Treatment: Oxygen ?Activity Tolerance: Patient limited by lethargy ?Patient left: in bed;with call bell/phone within reach;with bed alarm set;with family/visitor present ?  ?PT Visit Diagnosis: Unsteadiness on feet (R26.81);Muscle weakness (generalized) (M62.81);Other abnormalities of gait and mobility (R26.89);History of falling (Z91.81);Difficulty in walking, not elsewhere classified (R26.2);Pain ?Pain - Right/Left: Left ?Pain - part of body: Hip ?  ? ?Time: 0940-7680 ?PT Time Calculation (min) (ACUTE ONLY): 14 min ? ? ?Charges:   PT Evaluation ?$PT Eval Moderate Complexity: 1 Mod ?  ?  ?   ? ? ?Estoria Geary B. Beverely Risen PT, DPT ?Acute Rehabilitation Services ?Pager [phone] ?Office [phone] ? ? ?[name] ?07/01/2021,  1:43 PM ? ?

## 2021-07-01 NOTE — TOC Initial Note (Signed)
Transition of Care (TOC) - Initial/Assessment Note  ? ? ?Patient Details  ?Name: Gina Bradley ?MRN: [id number] ?Date of Birth: 07-23-1960 ? ?Transition of Care (TOC) CM/SW Contact:    ?Tom-Johnson, Renea Ee, RN ?Phone Number: ?07/01/2021, 4:47 PM ? ?Clinical Narrative:                 ? ?TOC consulted for LTACH placement for patient. Admitted for ARF with Hypoxia. Currently trached and on full ventilation. CM tried to reach husband but unsuccessful. CM called and spoke with daughter, Gina Bradley and explained disposition plan with facility options and she states she will try to reach patient's husband as he is not much involved in her care. States she will contact CM with response tomorrow. CM will continue to follow with needs.  ? ?Expected Discharge Plan: [place] (LTAC) ?Barriers to Discharge: Continued Medical Work up ? ? ?Patient Goals and CMS Choice ?Patient states their goals for this hospitalization and ongoing recovery are:: Unable to assess. Patient on full ventillation. ?CMS Medicare.gov Compare Post Acute Care list provided to:: Patient Represenative (must comment) (Daughter, Gina Bradley.) ?  ? ?Expected Discharge Plan and Services ?Expected Discharge Plan: [place] (LTAC) ?  ?Discharge Planning Services: CM Consult ?Post Acute Care Choice:  (LTACH) ?Living arrangements for the past 2 months: [place] ?                ?  ?  ?  ?  ?  ?  ?  ?  ?  ?  ? ?Prior Living Arrangements/Services ?Living arrangements for the past 2 months: [place] ?Lives with:: Spouse ?Patient language and need for interpreter reviewed:: Yes ?Do you feel safe going back to the place where you live?: Yes      ?Need for Family Participation in Patient Care: Yes (Comment) ?Care giver support system in place?: Yes (comment) ?  ?Criminal Activity/Legal Involvement Pertinent to Current Situation/Hospitalization: No - Comment as needed ? ?Activities of Daily Living ?  ?  ? ?Permission  Sought/Granted ?Permission sought to share information with : Case Manager, Customer service manager, Family Supports ?Permission granted to share information with : Yes, Verbal Permission Granted ?   ?   ?   ?   ? ?Emotional Assessment ?Appearance:: Appears stated age ?Attitude/Demeanor/Rapport: Other (comment) (Full ventillation, trach) ?Affect (typically observed): Other (comment) ?Orientation: : Oriented to Self ?Alcohol / Substance Use: Not Applicable ?Psych Involvement: No (comment) ? ?Admission diagnosis:  Acute respiratory failure with hypoxia ([place]) [J96.01] ?Multifocal pneumonia [J18.9] ?Sepsis, due to unspecified organism, unspecified whether acute organ dysfunction present ([place]) [A41.9] ?Patient Active Problem List  ? Diagnosis Date Noted  ? Acute metabolic encephalopathy 99991111  ? Opioid dependence ([place]) 06/23/2021  ? Multifocal pneumonia 06/23/2021  ? Obesity, Class III, BMI 40-49.9 (morbid obesity) ([place]) 06/23/2021  ? Transaminasemia 06/23/2021  ? Acute respiratory failure with hypoxia and hypercarbia ([place]) 06/22/2021  ? Severe sepsis ([place]) 06/22/2021  ? Right subacute Inferior pubic ramus fracture 06/22/2021  ? Endometrial thickening on CT scan 06/22/2021  ? Chronic heart failure with preserved ejection fraction (HFpEF) ([place]) 06/22/2021  ? Uncontrolled type 2 diabetes mellitus with hyperglycemia, without long-term current use of insulin ([place]) 08/18/2020  ? Tick bite of left lower leg   ? Peripheral edema 08/17/2020  ? Hypokalemia   ? Class 1 obesity due to excess calories with body mass index (BMI) of 33.0 to 33.9 in adult   ? Cellulitis   ?  Acidosis, metabolic [id number]  ? PMB (postmenopausal bleeding) 10/06/2016  ? Chest pressure 01/05/2015  ? Gastric erosion   ? Encounter for screening colonoscopy 01/22/2014  ? Constipation 01/20/2014  ? GERD (gastroesophageal reflux disease) 01/20/2014  ? Dyspepsia 01/20/2014  ? PSVT (paroxysmal supraventricular tachycardia) ([place]) 05/31/2013  ? HTN  (hypertension), benign 05/31/2013  ? Hypercholesteremia 05/31/2013  ? ?PCP:  Redmond School, MD ?Pharmacy:   ?[facility] [place] ?[place] ?[place] [address] ?Phone: [phone] Fax: [phone] ? ? ? ? ?Social Determinants of Health (SDOH) Interventions ?  ? ?Readmission Risk Interventions ?   ? View : No data to display.  ?  ?  ?  ? ? ? ?

## 2021-07-01 NOTE — Progress Notes (Signed)
? ?NAME:  Gina Bradley, MRN:  [id number], DOB:  May 14, 1960, LOS: 9 ?ADMISSION DATE:  06/22/2021, CONSULTATION DATE:  3/21 ?REFERRING MD:  Wynetta Emery, [facility], CHIEF COMPLAINT:  acute resp failure/ fever wth AMS and SVT ? ?History of Present Illness:  ?61 yo female former smoker presented to [facility] ER on 3/21 with AMS, fever.  Found to have multifocal pneumonia and required intubation and pressors.  Transferred to [facility] for further management. ? ?Pertinent  Medical History  ?Anemia, Back pain, Spinal stenosis COPD, DM type 2, HTN, GERD, CHF, Lymphedema, Neuropathy, SVT ? ?Significant Hospital Events: ?Including procedures, antibiotic start and stop dates in addition to other pertinent events   ?3/21 Admit to [facility], intubated, start pressors, cardiology consult for SVT, start amiodarone ?3/24 transfer to [facility] ? ?Studies:  ?CT head 3/21 >> mild cerebral atrophy, small-vessel disease and atrophic ventriculomegaly with unremarkable cerebellum and brainstem ?CT angio chest 3/21 >> subacute 3rd/4th rib fractures on Rt, Lt anterior subacute rib fx, multifocal pneumonia with ATX ?CT abd/pelvis 3/21 >> endometrium thickening. Remote L4 compression fracture with spurs and displaced inferior endplate causing biforaminal impingement at L4-5. Subacute right sacral insufficiency fracture with fat stranding and callus. Healing right obturator ring ?fractures. Healing left third through fifth lumbar transverse process fractures with callus. Nondisplaced left pubic body fracture. ? ?Interim History / Subjective:  ? ?Patient is intubated and sedated.  Not waking up to physical or verbal stimulation. ? ?Tmax 100.5. WBC 12 - 16 ?O 4.3 L ( -1L) ?Triglyceride 300 ? ?Objective   ?Blood pressure (!) 118/55, pulse (!) 113, temperature (!) 100.5 ?F (38.1 ?C), temperature source Axillary, resp. rate 18, height 5\' 5"  (1.651 m), weight 99.9 kg, last menstrual period 01/05/2015, SpO2 97 %. ?   ?Vent Mode: PRVC ?FiO2 (%):  [40 %] 40 % ?Set Rate:  [20 bmp] 20  bmp ?Vt Set:  [460 mL] 460 mL ?PEEP:  [5 cmH20-8 cmH20] 5 cmH20 ?Pressure Support:  [0 cmH20-8 cmH20] 0 cmH20 ?Plateau Pressure:  [12 cmH20-19 cmH20] 16 cmH20  ? ?Intake/Output Summary (Last 24 hours) at 07/01/2021 0731 ?Last data filed at 07/01/2021 0700 ?Gross per 24 hour  ?Intake 4071.86 ml  ?Output 4300 ml  ?Net -228.14 ml  ? ? ?Filed Weights  ? 06/29/21 0455 06/30/21 0500 07/01/21 0358  ?Weight: 105.7 kg 99.7 kg 99.9 kg  ? ?   ?Examination: ? ?General -sedated, chronically ill appearance ?Eyes -eyes closed.  ?ENT - trach in place, site appear clean ?Cardiac -regular rate and rhythm ?Chest - no wheezing ?Abdomen - soft, non tender, + bowel sounds ?Extremities - trace to 1+ edema ?Skin - areas of scattered ecchymosis ?Neuro -sedated.  Not waking up. ? ?Resolved Problems:  ?Septic shock, Elevated troponin from demand ischemia ? ?Assessment & Plan:  ?Acute on chronic hypoxic/hypercapnic respiratory failure from multifocal bacterial community acquired pneumonia (POA). ?COPD with acute exacerbation. ?- goal SpO2 > 90% ?- pulmicort, brovana, yupelri ?- prn albuterol ?- Prednisone 10 mg.  ?- Continue to wean off of sedation. SBT trial  ? ?Community acquired pneumonia vs aspiration PNA. ?Monitor off antibiotics.  ?CXR appears slightly better ?Repeat respiratory culture unremarkable ? ?Paroxysmal SVT. ?Acute on chronic diastolic CHF, exacerbated by SVT ?HTN, HLD, CAD ?- Rate control with Amiodarone and added Lopressor 25 mg BID ?- Good urine output overnight. Bicarb trending up.  Volume status improves on physical exam and chest x-ray.  Cut back diuresis to Lasix 40 mg daily ?- Continue ASA, lipitor, aldactone, losartan ?- Added  Clonidine for BP control and opioid withdrawal sx ?- hold outpt isosorbide.  ? ?Acute metabolic encephalopath ?Hx of peripheral neuropathy, back pain, chronic opiate use. ?- UDS positive for barbiturates ?- Cont Fentanyl and Propofol. Wean down as tolerated ?- Continue PO Oxy, Seroquel and Klonopin    ?- RASS goal 0 to -1 ?- hold outpt neurontin, robaxin, norco ? ?Anemia of critical illness. ?- f/u CBC ?- transfuse for Hb < 7 ? ?DM type 2 poorly controlled with steroid induced hyperglycemia. ?CBG slowly trending up ?- Increase Levemir to 15 units BID ?- SSI ? ?Endometrial thickening on CT scan. ?- will need outpt f/u with Gyn ? ?Rt subacute inferior pubic ramus fracture. ?- seen in ED on 06/06/21 and scheduled for outpt orthopedic follow up ? ?Best Practice (right click and "Reselect all SmartList Selections" daily)  ?DVT: lovenox ?SUP: protonix ?Diet: tube feeds ?Lines: Rt PICC 3/23 ?Foley: Yes. 3 days of bethanechol and voiding trial again ?Goals of care: Full code ? ?Labs   ? ? ?  Latest Ref Rng & Units 07/01/2021  ?  3:57 AM 06/30/2021  ?  4:52 AM 06/29/2021  ?  8:20 AM  ?CMP  ?Glucose 70 - 99 mg/dL 170   146     ?BUN 6 - 20 mg/dL 43   41     ?Creatinine 0.44 - 1.00 mg/dL 0.81   0.70     ?Sodium 135 - 145 mmol/L 148   147   146    ?Potassium 3.5 - 5.1 mmol/L 4.1   3.5   3.8    ?Chloride 98 - 111 mmol/L 101   101     ?CO2 22 - 32 mmol/L 39   38     ?Calcium 8.9 - 10.3 mg/dL 8.3   7.9     ? ? ? ?  Latest Ref Rng & Units 07/01/2021  ?  3:57 AM 06/30/2021  ?  4:52 AM 06/29/2021  ?  8:20 AM  ?CBC  ?WBC 4.0 - 10.5 K/uL 16.1   12.1     ?Hemoglobin 12.0 - 15.0 g/dL 9.6   9.3   9.2    ?Hematocrit 36.0 - 46.0 % 30.9   29.9   27.0    ?Platelets 150 - 400 K/uL 228   205     ? ? ?ABG ?   ?Component Value Date/Time  ? PHART 7.423 06/29/2021 0820  ? PCO2ART 57.3 (H) 06/29/2021 0820  ? PO2ART 92 06/29/2021 0820  ? HCO3 37.4 (H) 06/29/2021 0820  ? TCO2 39 (H) 06/29/2021 0820  ? ACIDBASEDEF 0.6 06/24/2021 1855  ? O2SAT 97 06/29/2021 0820  ? ? ?CBG (last 3)  ?Recent Labs  ?  06/30/21 ?2107 06/30/21 ?2314 07/01/21 ?0339  ?GLUCAP 172* 198* 163*  ? ? ?Critical care time:   ? ?Gaylan Gerold, DO ?

## 2021-07-02 ENCOUNTER — Inpatient Hospital Stay (HOSPITAL_COMMUNITY): Payer: 59

## 2021-07-02 DIAGNOSIS — J9602 Acute respiratory failure with hypercapnia: Secondary | ICD-10-CM | POA: Diagnosis not present

## 2021-07-02 DIAGNOSIS — E1140 Type 2 diabetes mellitus with diabetic neuropathy, unspecified: Secondary | ICD-10-CM | POA: Diagnosis not present

## 2021-07-02 DIAGNOSIS — J9601 Acute respiratory failure with hypoxia: Secondary | ICD-10-CM | POA: Diagnosis not present

## 2021-07-02 DIAGNOSIS — I10 Essential (primary) hypertension: Secondary | ICD-10-CM | POA: Diagnosis not present

## 2021-07-02 DIAGNOSIS — J449 Chronic obstructive pulmonary disease, unspecified: Secondary | ICD-10-CM | POA: Diagnosis not present

## 2021-07-02 LAB — BASIC METABOLIC PANEL
Anion gap: 9 (ref 5–15)
BUN: 42 mg/dL — ABNORMAL HIGH (ref 6–20)
CO2: 32 mmol/L (ref 22–32)
Calcium: 8.3 mg/dL — ABNORMAL LOW (ref 8.9–10.3)
Chloride: 102 mmol/L (ref 98–111)
Creatinine, Ser: 0.8 mg/dL (ref 0.44–1.00)
GFR, Estimated: >60 mL/min (ref >60)
Glucose, Bld: 207 mg/dL — ABNORMAL HIGH (ref 70–99)
Potassium: 3.7 mmol/L (ref 3.5–5.1)
Sodium: 143 mmol/L (ref 135–145)

## 2021-07-02 LAB — GLUCOSE, CAPILLARY
Glucose-Capillary: 156 mg/dL — ABNORMAL HIGH (ref 70–99)
Glucose-Capillary: 167 mg/dL — ABNORMAL HIGH (ref 70–99)
Glucose-Capillary: 181 mg/dL — ABNORMAL HIGH (ref 70–99)
Glucose-Capillary: 182 mg/dL — ABNORMAL HIGH (ref 70–99)
Glucose-Capillary: 194 mg/dL — ABNORMAL HIGH (ref 70–99)
Glucose-Capillary: 247 mg/dL — ABNORMAL HIGH (ref 70–99)

## 2021-07-02 LAB — CBC
HCT: 32.3 % — ABNORMAL LOW (ref 36.0–46.0)
Hemoglobin: 9.9 g/dL — ABNORMAL LOW (ref 12.0–15.0)
MCH: 30.9 pg (ref 26.0–34.0)
MCHC: 30.7 g/dL (ref 30.0–36.0)
MCV: 100.9 fL — ABNORMAL HIGH (ref 80.0–100.0)
Platelets: 254 10*3/uL (ref 150–400)
RBC: 3.2 MIL/uL — ABNORMAL LOW (ref 3.87–5.11)
RDW: 16.2 % — ABNORMAL HIGH (ref 11.5–15.5)
WBC: 19.5 10*3/uL — ABNORMAL HIGH (ref 4.0–10.5)
nRBC: 0.3 % — ABNORMAL HIGH (ref 0.0–0.2)

## 2021-07-02 LAB — TRIGLYCERIDES: Triglycerides: 264 mg/dL — ABNORMAL HIGH (ref <150)

## 2021-07-02 LAB — LACTIC ACID, PLASMA
Lactic Acid, Venous: 1.2 mmol/L (ref 0.5–1.9)
Lactic Acid, Venous: 0.9 mmol/L (ref 0.5–1.9)

## 2021-07-02 MED ORDER — IBUPROFEN 100 MG/5ML PO SUSP
600.0000 mg | Freq: Once | ORAL | Status: AC
  Administered 2021-07-02: 600 mg via ORAL
  Filled 2021-07-02: qty 30

## 2021-07-02 MED ORDER — NOREPINEPHRINE 4 MG/250ML-% IV SOLN
0.0000 ug/min | INTRAVENOUS | Status: DC
  Administered 2021-07-02: 2 ug/min via INTRAVENOUS
  Administered 2021-07-02: 10 ug/min via INTRAVENOUS
  Filled 2021-07-02 (×3): qty 250

## 2021-07-02 MED ORDER — LACTATED RINGERS IV BOLUS
500.0000 mL | Freq: Once | INTRAVENOUS | Status: AC
  Administered 2021-07-02: 500 mL via INTRAVENOUS

## 2021-07-02 MED ORDER — FUROSEMIDE 10 MG/ML IJ SOLN
40.0000 mg | Freq: Every day | INTRAMUSCULAR | Status: DC
  Filled 2021-07-02: qty 4

## 2021-07-02 MED ORDER — SODIUM CHLORIDE 0.9 % IV SOLN
2.0000 g | Freq: Three times a day (TID) | INTRAVENOUS | Status: DC
  Administered 2021-07-02 – 2021-07-06 (×12): 2 g via INTRAVENOUS
  Filled 2021-07-02 (×12): qty 2

## 2021-07-02 NOTE — Progress Notes (Signed)
RT note-Patient placed on wean, tolerated for 30 minutes, with increased work of breathing, sp02 88-89% and RR 30+. Placed back to full support, MD aware. ?

## 2021-07-02 NOTE — TOC Progression Note (Signed)
Transition of Care (TOC) - Progression Note  ? ? ?Patient Details  ?Name: MARILLA BODDY ?MRN: [id number] ?Date of Birth: 1960-08-04 ? ?Transition of Care (TOC) CM/SW Contact  ?Tom-Johnson, Hershal Coria, RN ?Phone Number: ?07/02/2021, 2:03 PM ? ?Clinical Narrative:    ? ?CM spoke with patient's husband, Ethelene Browns via phone and explained to him about LTACH recommendations. Ethelene Browns sates that he was told by MD that patient will be going to [facility] and a lady from [facility] called him and he accepted. CM received a call from [place] with [facility] and she states she will start insurance authorization and will get a response by Monday.  ?CM will continue to follow with needs.  ? ?Expected Discharge Plan: Long Term Acute Care (LTAC) ?Barriers to Discharge: Continued Medical Work up ? ?Expected Discharge Plan and Services ?Expected Discharge Plan: Long Term Acute Care (LTAC) ?  ?Discharge Planning Services: CM Consult ?Post Acute Care Choice:  (LTACH) ?Living arrangements for the past 2 months: Single Family Home ?                ?  ?  ?  ?  ?  ?  ?  ?  ?  ?  ? ? ?Social Determinants of Health (SDOH) Interventions ?  ? ?Readmission Risk Interventions ?   ? View : No data to display.  ?  ?  ?  ? ? ?

## 2021-07-02 NOTE — TOC Progression Note (Signed)
Transition of Care (TOC) - Progression Note  ? ? ?Patient Details  ?Name: Gina Bradley ?MRN: [id number] ?Date of Birth: [birth year] ? ?Transition of Care (TOC) CM/SW Contact  ?Tom-Johnson, Hershal Coria, RN ?Phone Number: ?07/02/2021, 5:06 PM ? ?Clinical Narrative:    ? ?CM received a call from Case management Office that [facility]bot Grumbling is requesting a Peer to Peer on Monday, 07/05/21 at 11 am. The number to call is [phone].  ?CM will continue to follow with needs. ? ?Expected Discharge Plan: Long Term Acute Care (LTAC) ?Barriers to Discharge: Continued Medical Work up ? ?Expected Discharge Plan and Services ?Expected Discharge Plan: Long Term Acute Care (LTAC) ?  ?Discharge Planning Services: CM Consult ?Post Acute Care Choice:  (LTACH) ?Living arrangements for the past 2 months: Single Family Home ?                ?  ?  ?  ?  ?  ?  ?  ?  ?  ?  ? ? ?Social Determinants of Health (SDOH) Interventions ?  ? ?Readmission Risk Interventions ?   ? View : No data to display.  ?  ?  ?  ? ? ?

## 2021-07-02 NOTE — Progress Notes (Addendum)
eLink Physician-Brief Progress Note ?Patient Name: Gina Bradley ?DOB: 1961/03/13 ?MRN: [id number] ? ? ?Date of Service ? 07/02/2021  ?HPI/Events of Note ? Pt required precedex and this has been restarted.   ?eICU Interventions ? Precedex gtt order renewed.  ? ? ? ?Intervention Category ?Minor Interventions: Agitation / anxiety - evaluation and management ? ?Larinda Buttery ?07/02/2021, 9:58 PM ? ?4:22 AM ?RN reported rhonchi on exam and bloody secretions from tracheostomy.  ? ?Plan>CXR ordered.  ?

## 2021-07-02 NOTE — Progress Notes (Signed)
? ?NAME:  Gina Bradley, MRN:  [id number], DOB:  1960/08/26, LOS: 10 ?ADMISSION DATE:  06/22/2021, CONSULTATION DATE:  3/21 ?REFERRING MD:  Wynetta Emery, [facility], CHIEF COMPLAINT:  acute resp failure/ fever wth AMS and SVT ? ?History of Present Illness:  ?61 yo female former smoker presented to [facility] ER on 3/21 with AMS, fever.  Found to have multifocal pneumonia and required intubation and pressors.  Transferred to [facility] for further management. ? ?Pertinent  Medical History  ?Anemia, Back pain, Spinal stenosis COPD, DM type 2, HTN, GERD, CHF, Lymphedema, Neuropathy, SVT ? ?Significant Hospital Events: ?Including procedures, antibiotic start and stop dates in addition to other pertinent events   ?3/21 Admit to [facility], intubated, start pressors, cardiology consult for SVT, start amiodarone ?3/24 transfer to [facility] ?3/28 Trach for failure to wean ? ?Studies:  ?CT head 3/21 >> mild cerebral atrophy, small-vessel disease and atrophic ventriculomegaly with unremarkable cerebellum and brainstem ?CT angio chest 3/21 >> subacute 3rd/4th rib fractures on Rt, Lt anterior subacute rib fx, multifocal pneumonia with ATX ?CT abd/pelvis 3/21 >> endometrium thickening. Remote L4 compression fracture with spurs and displaced inferior endplate causing biforaminal impingement at L4-5. Subacute right sacral insufficiency fracture with fat stranding and callus. Healing right obturator ring ?fractures. Healing left third through fifth lumbar transverse process fractures with callus. Nondisplaced left pubic body fracture. ? ?Interim History / Subjective:  ? ?Overnight was hypotensive requiring holding of sedation and IVF bolus. Briefly required Neo gtt and currently off pressors ? ?RT patient able to wean yesterday however this am increased RR and borderline SpO2 on PS. Prior to yesterday notes comment on failing SBT multiple times and team discussed with family regarding difficulty weaning ? ?Diuresed with good UOP ? ?Objective   ?Blood pressure (!)  148/54, pulse (!) 122, temperature (!) 100.4 ?F (38 ?C), temperature source Oral, resp. rate (!) 23, height 5\' 5"  (1.651 m), weight 97.3 kg, last menstrual period 01/05/2015, SpO2 95 %. ?   ?Vent Mode: PRVC ?FiO2 (%):  [40 %] 40 % ?Set Rate:  [20 bmp] 20 bmp ?Vt Set:  [460 mL] 460 mL ?PEEP:  [5 cmH20-8 cmH20] 5 cmH20 ?Pressure Support:  [8 cmH20] 8 cmH20 ?Plateau Pressure:  [18 cmH20] 18 cmH20  ? ?Intake/Output Summary (Last 24 hours) at 07/02/2021 [id number] ?Last data filed at 07/02/2021 0700 ?Gross per 24 hour  ?Intake 2990.41 ml  ?Output 5275 ml  ?Net -2284.59 ml  ? ?Filed Weights  ? 06/30/21 0500 07/01/21 0358 07/02/21 0500  ?Weight: 99.7 kg 99.9 kg 97.3 kg  ? ?   ?Physical Exam: ?General: Chronically ill-appearing, appears uncomfortable, mild respiratory distress ?HENT: [place], AT, OP clear, MMM ?Neck: Trach in place, c/d/i ?Eyes: EOMI, no scleral icterus ?Respiratory: Tachypneic, Diminished breath sounds  bilaterally.  No crackles, wheezing or rales ?Cardiovascular: RRR, -M/R/G, no JVD ?GI: BS+, soft, nontender ?Extremities: Trace pedal edema,-tenderness ?Neuro: Eyes closed, PERRL, no tracking, does not follow commands, turns head spontaneously, moves extremities x 4 ?GU: Foley in place ? ? ?Resolved Problems:  ?Septic shock, Elevated troponin from demand ischemia ? ?Assessment & Plan:  ?Acute metabolic encephalopathy ?Hx of peripheral neuropathy, back pain, chronic opiate use. ?- UDS positive for barbiturates ?- Wean Fentanyl and Precedex for RASS goal 0 and -1 ?- Continue PO Oxy, Seroquel and Klonopin   ?- hold outpt neurontin, robaxin, norco ? ?Acute on chronic hypoxic/hypercapnic respiratory failure from multifocal bacterial community acquired pneumonia (POA). ?COPD with acute exacerbation. ?S/p trach 3/28 for failure to wean ?-  Full vent support ?- Daily SBT/WUA. Has previously failed SBT. Did tolerate yesterday however failed again today ?- pulmicort, brovana, yupelri ?- prn albuterol ?- Prednisone 10 mg ?-  VAP ? ?Community acquired pneumonia vs aspiration PNA. ?Monitor off antibiotics. Repeat respiratory culture unremarkable. Worsening leukocytosis ?- Restart infectious work-up if febrile or decompensates ? ?Paroxysmal SVT. ?Acute on chronic diastolic CHF, exacerbated by SVT ?HTN, HLD, CAD ?- Appreciate Cardiology recs. On BB. Discontinued amio  ?- Lasix 40 mg daily and spironolactone BID. Goal net even/neg daily ?- Continue ASA, lipitor, aldactone, losartan ?- Continue Clonidine for BP control and opioid withdrawal sx ?- hold outpt isosorbide.  ? ?Anemia of critical illness. ?- f/u CBC ?- transfuse for Hb < 7 ? ?DM type 2 poorly controlled with steroid induced hyperglycemia. ?CBG slowly trending up ?-Continue Levemir to 15 units BID ?- SSI ? ?Endometrial thickening on CT scan. ?- will need outpt f/u with Gyn ? ?Rt subacute inferior pubic ramus fracture. ?- seen in ED on 06/06/21 and scheduled for outpt orthopedic follow up ? ?Best Practice (right click and "Reselect all SmartList Selections" daily)  ?DVT: lovenox ?SUP: protonix ?Diet: tube feeds ?Lines: Rt PICC 3/23 ?Foley: Yes. 3 days of bethanechol and voiding trial again ?Goals of care: Full code. Updated husband 3/31 via telephone ? ?Labs   ? ? ?  Latest Ref Rng & Units 07/02/2021  ?  4:08 AM 07/01/2021  ?  3:57 AM 06/30/2021  ?  4:52 AM  ?CMP  ?Glucose 70 - 99 mg/dL 207   170   146    ?BUN 6 - 20 mg/dL 42   43   41    ?Creatinine 0.44 - 1.00 mg/dL 0.80   0.81   0.70    ?Sodium 135 - 145 mmol/L 143   148   147    ?Potassium 3.5 - 5.1 mmol/L 3.7   4.1   3.5    ?Chloride 98 - 111 mmol/L 102   101   101    ?CO2 22 - 32 mmol/L 32   39   38    ?Calcium 8.9 - 10.3 mg/dL 8.3   8.3   7.9    ? ? ? ?  Latest Ref Rng & Units 07/02/2021  ?  4:08 AM 07/01/2021  ?  3:57 AM 06/30/2021  ?  4:52 AM  ?CBC  ?WBC 4.0 - 10.5 K/uL 19.5   16.1   12.1    ?Hemoglobin 12.0 - 15.0 g/dL 9.9   9.6   9.3    ?Hematocrit 36.0 - 46.0 % 32.3   30.9   29.9    ?Platelets 150 - 400 K/uL 254   228   205     ? ? ?ABG ?   ?Component Value Date/Time  ? PHART 7.423 06/29/2021 0820  ? PCO2ART 57.3 (H) 06/29/2021 0820  ? PO2ART 92 06/29/2021 0820  ? HCO3 37.4 (H) 06/29/2021 0820  ? TCO2 39 (H) 06/29/2021 0820  ? ACIDBASEDEF 0.6 06/24/2021 1855  ? O2SAT 97 06/29/2021 0820  ? ? ?CBG (last 3)  ?Recent Labs  ?  07/01/21 ?2308 07/02/21 ?0312 07/02/21 ?0749  ?GLUCAP 162* 181* 156*  ? ?Critical care time: 70 min  ? ?The patient is critically ill with multiple organ systems failure and requires high complexity decision making for assessment and support, frequent evaluation and titration of therapies, application of advanced monitoring technologies and extensive interpretation of multiple databases.   ? ?Rodman Pickle, M.D. ?[place] [facility] ?07/02/2021 8:21 AM  ? ?Please see Amion for pager number to reach on-call Pulmonary and Critical Care Team. ? ?

## 2021-07-02 NOTE — Progress Notes (Signed)
Pharmacy Antibiotic Note ? ?Gina Bradley is a 61 y.o. female admitted on 06/22/2021 with sepsis.  Pharmacy has been consulted for cefepime dosing. ? ?Plan: ?Cefepime 2g IV q 8 hrs. ? ?Height: 5\' 5"  (165.1 cm) ?Weight: 97.3 kg (214 lb 8.1 oz) ?IBW/kg (Calculated) : 57 ? ?Temp (24hrs), Avg:101 ?F (38.3 ?C), Min:100 ?F (37.8 ?C), Max:103.3 ?F (39.6 ?C) ? ?Recent Labs  ?Lab 06/28/21 ?0406 06/29/21 ?07/01/21 06/29/21 ?07/01/21 06/30/21 ?07/02/21 07/01/21 ?07/03/21 07/02/21 ?0408 07/02/21 ?1242  ?WBC 8.5 10.1  --  12.1* 16.1* 19.5*  --   ?CREATININE 0.69  --  0.89 0.70 0.81 0.80  --   ?LATICACIDVEN  --   --   --   --   --   --  1.2  ?  ?Estimated Creatinine Clearance: 86.3 mL/min (by C-G formula based on SCr of 0.8 mg/dL).   ? ?Allergies  ?Allergen Reactions  ? Bee Venom Other (See Comments)  ?  Unknown  ? Tdap [Tetanus-Diphth-Acell Pertussis] Other (See Comments)  ?  Unknown  ? ? ?Antimicrobials this admission: ?Doxy 3/22 >> 3/24 ?CTX 3/21 >>3/25 ?Azith 3/21 x1 dose ?Vanc 3/24 >>3/25 ?Zosyn 3/25 >> 3/28 ?Cefepime 3/31 >  ? ?Dose adjustments this admission: ? ?Microbiology results: ?3/27 TA - rare GPC - normal resp flora ?3/23 BCx -1/4 staph hominis, contaminant ?3/23 UCx >> 1K MSSA, contaminant ?3/22 TA negative ?3/21 legionella/strep pneumo - neg ? ?Thank you for allowing pharmacy to be a part of this patient?s care. ? ?4/21, Pharm D, BCPS, BCCP ?Clinical Pharmacist ? 07/02/2021 4:01 PM  ? ?[facility] pharmacy phone numbers are listed on [email or web address] ? ?

## 2021-07-03 ENCOUNTER — Inpatient Hospital Stay (HOSPITAL_COMMUNITY): Payer: 59

## 2021-07-03 DIAGNOSIS — J189 Pneumonia, unspecified organism: Secondary | ICD-10-CM

## 2021-07-03 DIAGNOSIS — A419 Sepsis, unspecified organism: Principal | ICD-10-CM

## 2021-07-03 DIAGNOSIS — E1165 Type 2 diabetes mellitus with hyperglycemia: Secondary | ICD-10-CM

## 2021-07-03 DIAGNOSIS — J9601 Acute respiratory failure with hypoxia: Secondary | ICD-10-CM

## 2021-07-03 DIAGNOSIS — R6520 Severe sepsis without septic shock: Secondary | ICD-10-CM

## 2021-07-03 DIAGNOSIS — J9602 Acute respiratory failure with hypercapnia: Secondary | ICD-10-CM

## 2021-07-03 LAB — CBC WITH DIFFERENTIAL/PLATELET
Abs Immature Granulocytes: 1.87 10*3/uL — ABNORMAL HIGH (ref 0.00–0.07)
Basophils Absolute: 0.1 10*3/uL (ref 0.0–0.1)
Basophils Relative: 1 %
Eosinophils Absolute: 0.1 10*3/uL (ref 0.0–0.5)
Eosinophils Relative: 1 %
HCT: 27.9 % — ABNORMAL LOW (ref 36.0–46.0)
Hemoglobin: 8.5 g/dL — ABNORMAL LOW (ref 12.0–15.0)
Immature Granulocytes: 8 %
Lymphocytes Relative: 4 %
Lymphs Abs: 1 10*3/uL (ref 0.7–4.0)
MCH: 30.9 pg (ref 26.0–34.0)
MCHC: 30.5 g/dL (ref 30.0–36.0)
MCV: 101.5 fL — ABNORMAL HIGH (ref 80.0–100.0)
Monocytes Absolute: 1.1 10*3/uL — ABNORMAL HIGH (ref 0.1–1.0)
Monocytes Relative: 5 %
Neutro Abs: 18 10*3/uL — ABNORMAL HIGH (ref 1.7–7.7)
Neutrophils Relative %: 81 %
Platelets: 293 10*3/uL (ref 150–400)
RBC: 2.75 MIL/uL — ABNORMAL LOW (ref 3.87–5.11)
RDW: 15.9 % — ABNORMAL HIGH (ref 11.5–15.5)
WBC: 22.2 10*3/uL — ABNORMAL HIGH (ref 4.0–10.5)
nRBC: 0.1 % (ref 0.0–0.2)

## 2021-07-03 LAB — BLOOD CULTURE ID PANEL (REFLEXED) - BCID2

## 2021-07-03 LAB — BASIC METABOLIC PANEL
Anion gap: 7 (ref 5–15)
BUN: 42 mg/dL — ABNORMAL HIGH (ref 6–20)
CO2: 30 mmol/L (ref 22–32)
Calcium: 8.1 mg/dL — ABNORMAL LOW (ref 8.9–10.3)
Chloride: 103 mmol/L (ref 98–111)
Creatinine, Ser: 0.74 mg/dL (ref 0.44–1.00)
GFR, Estimated: >60 mL/min (ref >60)
Glucose, Bld: 255 mg/dL — ABNORMAL HIGH (ref 70–99)
Potassium: 4.2 mmol/L (ref 3.5–5.1)
Sodium: 140 mmol/L (ref 135–145)

## 2021-07-03 LAB — CBC
HCT: 29.9 % — ABNORMAL LOW (ref 36.0–46.0)
Hemoglobin: 9.2 g/dL — ABNORMAL LOW (ref 12.0–15.0)
MCH: 31.1 pg (ref 26.0–34.0)
MCHC: 30.8 g/dL (ref 30.0–36.0)
MCV: 101 fL — ABNORMAL HIGH (ref 80.0–100.0)
Platelets: 275 10*3/uL (ref 150–400)
RBC: 2.96 MIL/uL — ABNORMAL LOW (ref 3.87–5.11)
RDW: 16.2 % — ABNORMAL HIGH (ref 11.5–15.5)
WBC: 22.9 10*3/uL — ABNORMAL HIGH (ref 4.0–10.5)
nRBC: 0.2 % (ref 0.0–0.2)

## 2021-07-03 LAB — DIC (DISSEMINATED INTRAVASCULAR COAGULATION)PANEL
D-Dimer, Quant: 7.8 ug/mL-FEU — ABNORMAL HIGH (ref 0.00–0.50)
Fibrinogen: 656 mg/dL — ABNORMAL HIGH (ref 210–475)
INR: 1.1 (ref 0.8–1.2)
Platelets: 280 10*3/uL (ref 150–400)
Prothrombin Time: 14.1 seconds (ref 11.4–15.2)
Smear Review: NONE SEEN
aPTT: 30 seconds (ref 24–36)

## 2021-07-03 LAB — GLUCOSE, CAPILLARY
Glucose-Capillary: 164 mg/dL — ABNORMAL HIGH (ref 70–99)
Glucose-Capillary: 175 mg/dL — ABNORMAL HIGH (ref 70–99)
Glucose-Capillary: 189 mg/dL — ABNORMAL HIGH (ref 70–99)
Glucose-Capillary: 192 mg/dL — ABNORMAL HIGH (ref 70–99)
Glucose-Capillary: 222 mg/dL — ABNORMAL HIGH (ref 70–99)
Glucose-Capillary: 228 mg/dL — ABNORMAL HIGH (ref 70–99)

## 2021-07-03 LAB — PROCALCITONIN: Procalcitonin: 0.28 ng/mL

## 2021-07-03 LAB — MAGNESIUM: Magnesium: 2.4 mg/dL (ref 1.7–2.4)

## 2021-07-03 MED ORDER — FLUCONAZOLE 150 MG PO TABS
150.0000 mg | ORAL_TABLET | Freq: Every day | ORAL | Status: DC
  Administered 2021-07-03: 150 mg via ORAL
  Filled 2021-07-03 (×2): qty 1

## 2021-07-03 MED ORDER — VANCOMYCIN HCL 1500 MG/300ML IV SOLN
1500.0000 mg | INTRAVENOUS | Status: DC
  Administered 2021-07-04 – 2021-07-05 (×2): 1500 mg via INTRAVENOUS
  Filled 2021-07-03 (×2): qty 300

## 2021-07-03 MED ORDER — DIPHENHYDRAMINE HCL 25 MG PO CAPS
25.0000 mg | ORAL_CAPSULE | Freq: Four times a day (QID) | ORAL | Status: DC | PRN
  Administered 2021-07-03: 25 mg via ORAL
  Filled 2021-07-03: qty 1

## 2021-07-03 MED ORDER — VANCOMYCIN HCL 2000 MG/400ML IV SOLN
2000.0000 mg | Freq: Once | INTRAVENOUS | Status: AC
  Administered 2021-07-03: 2000 mg via INTRAVENOUS
  Filled 2021-07-03: qty 400

## 2021-07-03 MED ORDER — NYSTATIN 100000 UNIT/GM EX CREA
TOPICAL_CREAM | Freq: Two times a day (BID) | CUTANEOUS | Status: DC
  Administered 2021-07-06 – 2021-07-11 (×5): 1 via TOPICAL
  Filled 2021-07-03 (×2): qty 30

## 2021-07-03 NOTE — Progress Notes (Signed)
PHARMACY - PHYSICIAN COMMUNICATION ?CRITICAL VALUE ALERT - BLOOD CULTURE IDENTIFICATION (BCID) ? ?Gina Bradley is an 61 y.o. female who presented to [facility] on 06/22/2021 with a chief complaint of multifocal pneumonia ? ?Assessment:  1/out of 4 blood cultures positive for staph epi, MEC A+ - likely a contaminant ? ?Name of physician (or Provider) Contacted: Dr, Gwynn Burly ? ?Current antibiotics: Cefepime and Diflucan ? ?Changes to prescribed antibiotics recommended:  ?Patient is on recommended antibiotics - No changes needed ? ?Results for orders placed or performed during the hospital encounter of 06/22/21  ?Blood Culture ID Panel (Reflexed) (Collected: 07/02/2021  4:01 PM)  ?Result Value Ref Range  ? Enterococcus faecalis NOT DETECTED NOT DETECTED  ? Enterococcus Faecium NOT DETECTED NOT DETECTED  ? Listeria monocytogenes NOT DETECTED NOT DETECTED  ? Staphylococcus species DETECTED (A) NOT DETECTED  ? Staphylococcus aureus (BCID) NOT DETECTED NOT DETECTED  ? Staphylococcus epidermidis DETECTED (A) NOT DETECTED  ? Staphylococcus lugdunensis NOT DETECTED NOT DETECTED  ? Streptococcus species NOT DETECTED NOT DETECTED  ? Streptococcus agalactiae NOT DETECTED NOT DETECTED  ? Streptococcus pneumoniae NOT DETECTED NOT DETECTED  ? Streptococcus pyogenes NOT DETECTED NOT DETECTED  ? A.calcoaceticus-baumannii NOT DETECTED NOT DETECTED  ? Bacteroides fragilis NOT DETECTED NOT DETECTED  ? Enterobacterales NOT DETECTED NOT DETECTED  ? Enterobacter cloacae complex NOT DETECTED NOT DETECTED  ? Escherichia coli NOT DETECTED NOT DETECTED  ? Klebsiella aerogenes NOT DETECTED NOT DETECTED  ? Klebsiella oxytoca NOT DETECTED NOT DETECTED  ? Klebsiella pneumoniae NOT DETECTED NOT DETECTED  ? Proteus species NOT DETECTED NOT DETECTED  ? Salmonella species NOT DETECTED NOT DETECTED  ? Serratia marcescens NOT DETECTED NOT DETECTED  ? Haemophilus influenzae NOT DETECTED NOT DETECTED  ? Neisseria meningitidis NOT DETECTED NOT  DETECTED  ? Pseudomonas aeruginosa NOT DETECTED NOT DETECTED  ? Stenotrophomonas maltophilia NOT DETECTED NOT DETECTED  ? Candida albicans NOT DETECTED NOT DETECTED  ? Candida auris NOT DETECTED NOT DETECTED  ? Candida glabrata NOT DETECTED NOT DETECTED  ? Candida krusei NOT DETECTED NOT DETECTED  ? Candida parapsilosis NOT DETECTED NOT DETECTED  ? Candida tropicalis NOT DETECTED NOT DETECTED  ? Cryptococcus neoformans/gattii NOT DETECTED NOT DETECTED  ? Methicillin resistance mecA/C DETECTED (A) NOT DETECTED  ? ? ?Tera Mater ?07/03/2021  1:18 PM ? ?

## 2021-07-03 NOTE — Progress Notes (Signed)
? ?NAME:  Gina Bradley, MRN:  [id number], DOB:  [birth year], LOS: 14 ?ADMISSION DATE:  06/22/2021, CONSULTATION DATE:  3/21 ?REFERRING MD:  Wynetta Emery, [facility], CHIEF COMPLAINT:  acute resp failure/ fever wth AMS and SVT ? ?History of Present Illness:  ?61 yo female former smoker presented to [facility] ER on 3/21 with AMS, fever.  Found to have multifocal pneumonia and required intubation and pressors.  Transferred to [facility] for further management. ? ?Pertinent  Medical History  ?Anemia, Back pain, Spinal stenosis COPD, DM type 2, HTN, GERD, CHF, Lymphedema, Neuropathy, SVT ? ?Significant Hospital Events: ?Including procedures, antibiotic start and stop dates in addition to other pertinent events   ?3/21 Admit to [facility], intubated, start pressors, cardiology consult for SVT, start amiodarone ?3/24 transfer to [facility] ?3/28 Trach for failure to wean ?3/31 Fever, hypotension, worsening leukocytosis. Started on levophed and antibiotics ? ?Studies:  ?CT head 3/21 >> mild cerebral atrophy, small-vessel disease and atrophic ventriculomegaly with unremarkable cerebellum and brainstem ?CT angio chest 3/21 >> subacute 3rd/4th rib fractures on Rt, Lt anterior subacute rib fx, multifocal pneumonia with ATX ?CT abd/pelvis 3/21 >> endometrium thickening. Remote L4 compression fracture with spurs and displaced inferior endplate causing biforaminal impingement at L4-5. Subacute right sacral insufficiency fracture with fat stranding and callus. Healing right obturator ring ?fractures. Healing left third through fifth lumbar transverse process fractures with callus. Nondisplaced left pubic body fracture. ? ?Interim History / Subjective:  ? ?Yesterday became febrile hypotensive and requiring levophed which has been weaned off. ? ?Overnight developed diffuse maculopapular rash ? ?Objective   ?Blood pressure 127/78, pulse (!) 111, temperature 99.4 ?F (37.4 ?C), temperature source Oral, resp. rate (!) 25, height 5\' 5"  (1.651 m), weight 100.4 kg, last  menstrual period 01/05/2015, SpO2 92 %. ?   ?Vent Mode: PRVC ?FiO2 (%):  [50 %] 50 % ?Set Rate:  [20 bmp] 20 bmp ?Vt Set:  [460 mL] 460 mL ?PEEP:  [5 cmH20] 5 cmH20 ?Plateau Pressure:  [13 cmH20-19 cmH20] 13 cmH20  ? ?Intake/Output Summary (Last 24 hours) at 07/03/2021 0838 ?Last data filed at 07/03/2021 0600 ?Gross per 24 hour  ?Intake 3269.77 ml  ?Output 2625 ml  ?Net 644.77 ml  ? ?Filed Weights  ? 07/01/21 0358 07/02/21 0500 07/03/21 0339  ?Weight: 99.9 kg 97.3 kg 100.4 kg  ? ?  Physical Exam: ?General: Chronically ill-appearing, intermittently agitated ?HENT: [place], AT, OP clear, MMM ?Neck: Trach in place, c/d/i ?Eyes: EOMI, no scleral icterus ?Respiratory: Clear to auscultation bilaterally.  No crackles, wheezing or rales ?Cardiovascular: Tachycardic, RR, -M/R/G, no JVD ?GI: BS+, soft, nontender ?Extremities:-Edema,-tenderness ?Neuro: Eyes closed, PERRL, does not follow commands, moves extremities x 4 ?Skin: Maculopapular rash involving back and thighs, worsening lower extremity purpura ?GU: Foley in place ? ?CXR 07/03/21 - Unchanged bilateral opacities ? ?Resolved Problems:  ?Septic shock, Elevated troponin from demand ischemia ? ?Assessment & Plan:  ?Acute metabolic encephalopathy ?Hx of peripheral neuropathy, back pain, chronic opiate use. ?- UDS positive for barbiturates ?- Wean Fentanyl and Precedex for RASS goal 0 and -1 ?- Continue PO Oxy, Seroquel and Klonopin   ?- hold outpt neurontin, robaxin, norco ? ?Acute on chronic hypoxic/hypercapnic respiratory failure from multifocal bacterial community acquired pneumonia (POA). ?COPD with acute exacerbation. ?S/p trach 3/28 for failure to wean ?- Full vent support ?- Daily SBT/WUA. Has previously failed SBT. Did tolerate yesterday 3/30 however has failed since then ?- pulmicort, brovana, yupelri ?- prn albuterol ?- Prednisone 10 mg ?- VAP ? ?Fever, rash  ?  Recently treated for CAP/aspiration pna. Repeat respiratory and blood culture unremarkable. Worsening leukocytosis.  Briefly required vasopressors 3/31.  CXR unchanged and on minimal vent settings ?- Started 3/31 Cefepime ?- F/u blood cultures. Repeat trach aspirate ?- DIC panel ?- Consult ID for rash ? ?Paroxysmal SVT. ?Acute on chronic diastolic CHF, exacerbated by SVT ?HTN, HLD, CAD ?- Appreciate Cardiology recs. On BB. Discontinued amio  ?- Lasix 40 mg daily and spironolactone BID. Goal net even/neg daily ?- Continue ASA, lipitor, aldactone, losartan ?- Continue Clonidine for BP control and opioid withdrawal sx ?- hold outpt isosorbide.  ? ?Anemia of critical illness. ?- f/u CBC ?- transfuse for Hb < 7 ? ?DM type 2 poorly controlled with steroid induced hyperglycemia. ?CBG slowly trending up ?-Continue Levemir to 15 units BID ?- SSI ? ?Endometrial thickening on CT scan. ?- will need outpt f/u with Gyn ? ?Rt subacute inferior pubic ramus fracture. ?- seen in ED on 06/06/21 and scheduled for outpt orthopedic follow up ? ?Best Practice (right click and "Reselect all SmartList Selections" daily)  ?DVT: lovenox ?SUP: protonix ?Diet: tube feeds ?Lines: Rt PICC 3/23 ?Foley: Yes. 3 days of bethanechol and voiding trial again ?Goals of care: Full code. Updated husband 3/31 via telephone ? ?Labs   ? ? ?  Latest Ref Rng & Units 07/03/2021  ?  3:40 AM 07/02/2021  ?  4:08 AM 07/01/2021  ?  3:57 AM  ?CMP  ?Glucose 70 - 99 mg/dL 255   207   170    ?BUN 6 - 20 mg/dL 42   42   43    ?Creatinine 0.44 - 1.00 mg/dL 0.74   0.80   0.81    ?Sodium 135 - 145 mmol/L 140   143   148    ?Potassium 3.5 - 5.1 mmol/L 4.2   3.7   4.1    ?Chloride 98 - 111 mmol/L 103   102   101    ?CO2 22 - 32 mmol/L 30   32   39    ?Calcium 8.9 - 10.3 mg/dL 8.1   8.3   8.3    ? ? ? ?  Latest Ref Rng & Units 07/03/2021  ?  3:40 AM 07/02/2021  ?  4:08 AM 07/01/2021  ?  3:57 AM  ?CBC  ?WBC 4.0 - 10.5 K/uL 22.9   19.5   16.1    ?Hemoglobin 12.0 - 15.0 g/dL 9.2   9.9   9.6    ?Hematocrit 36.0 - 46.0 % 29.9   32.3   30.9    ?Platelets 150 - 400 K/uL 275   254   228    ? ? ?ABG ?    ?Component Value Date/Time  ? PHART 7.423 06/29/2021 0820  ? PCO2ART 57.3 (H) 06/29/2021 0820  ? PO2ART 92 06/29/2021 0820  ? HCO3 37.4 (H) 06/29/2021 0820  ? TCO2 39 (H) 06/29/2021 0820  ? ACIDBASEDEF 0.6 06/24/2021 1855  ? O2SAT 97 06/29/2021 0820  ? ? ?CBG (last 3)  ?Recent Labs  ?  07/02/21 ?2341 07/03/21 ?[id number] 07/03/21 ?0732  ?GLUCAP 194* 228* 175*  ? ?Critical care time: 60 min  ? ?The patient is critically ill with multiple organ systems failure and requires high complexity decision making for assessment and support, frequent evaluation and titration of therapies, application of advanced monitoring technologies and extensive interpretation of multiple databases. ? ?Rodman Pickle, M.D. ?[facility] ?07/03/2021 8:38 AM  ? ?Please see Amion for pager  number to reach on-call Pulmonary and Critical Care Team. ? ?

## 2021-07-03 NOTE — Consult Note (Addendum)
?  [facility] for Infectious Disease  ? ? ?Date of Admission:  06/22/2021    ? ?Reason for Consult: Rash ?    ?Referring Physician: Dr Loanne Drilling ? ?Current antibiotics: ?Cefepime 3/31 -- present ? ?Previous antibiotics: ?Azithromycin 3/21 ?Ceftriaxone 3/21-3/25 ?Doxycycline 3/21-3/23 ?Vancomycin 3/24 ?Zosyn 3/25-3/27 ? ? ?ASSESSMENT:   ? ?61 y.o. female admitted with: ? ?Diffuse maculopapular rash and petechial areas of lower extremities  ?Fever, leukocytosis ?Multifocal pneumonia, hypoxic respiratory failure requiring tracheostomy ?DM type 2: A1c is 7.5 and sugars have been elevated with steroids ?Staph hominis positive blood culture: likely a contaminant ?MSSA positive urine culture: likely related to Foley placement and no growth of MSSA in blood cultures ?Severe sepsis ? ?She began developing worsening leukocytosis and subsequent fevers shortly after completing 7 day course of antibiotics for multifocal CAP/aspiration pneumonia.  CXR is relatively unchanged and vent setting stable with FiO2 40-50% and PEEP 5.  PICC line has been in place since 3/23 but she has no other central lines at present.  Her maculopapular rash developed abruptly overnight 3/31-4/1 and could be consistent with possible drug rash. ? ?RECOMMENDATIONS:   ? ?Check differential on CBC ?Tracheal aspirate and blood cultures ?CT chest since she seemed to develop worsening WBC and fevers shortly after treatment for pneumonia to see if any complicating features ?Continue cefepime for now ?MRSA PCR is negative x 2 this admission so will hold off adding vancomycin for now ?May need to consider PICC removal and exchange if work up is unremarkable and leukocytosis/fevers persist ?Drug rash to cefepime is possible but seems less likely given short latency of time between first administration and onset of rash.  However, sensitization may have taken place earlier with prior antibiotics ?ANA, ANCA ?If worsening rash, consider surgery to punch biopsy  and send for pathology ?Will follow ? ? ?Principal Problem: ?  Acute respiratory failure with hypoxia and hypercarbia (HCC) ?Active Problems: ?  PSVT (paroxysmal supraventricular tachycardia) ([place]) ?  HTN (hypertension), benign ?  GERD (gastroesophageal reflux disease) ?  Uncontrolled type 2 diabetes mellitus with hyperglycemia, without long-term current use of insulin ([place]) ?  Severe sepsis ([place]) ?  Right subacute Inferior pubic ramus fracture ?  Endometrial thickening on CT scan ?  Chronic heart failure with preserved ejection fraction (HFpEF) ([place]) ?  Acute metabolic encephalopathy ?  Opioid dependence ([place]) ?  Multifocal pneumonia ?  Obesity, Class III, BMI 40-49.9 (morbid obesity) ([place]) ?  Transaminasemia ? ? ?MEDICATIONS:   ? ?Scheduled Meds: ?? arformoterol  15 mcg Nebulization BID  ?? aspirin  81 mg Per Tube Daily  ?? atorvastatin  40 mg Per Tube Daily  ?? budesonide (PULMICORT) nebulizer solution  0.25 mg Nebulization BID  ?? chlorhexidine gluconate (MEDLINE KIT)  15 mL Mouth Rinse BID  ?? Chlorhexidine Gluconate Cloth  6 each Topical Q0600  ?? clonazePAM  1 mg Per Tube Q6H  ?? docusate  100 mg Per Tube BID  ?? enoxaparin (LOVENOX) injection  50 mg Subcutaneous Q24H  ?? feeding supplement (PROSource TF)  45 mL Per Tube QID  ?? fluconazole  150 mg Oral Daily  ?? free water  200 mL Per Tube Q6H  ?? furosemide  40 mg Intravenous Daily  ?? insulin aspart  0-20 Units Subcutaneous Q4H  ?? insulin detemir  15 Units Subcutaneous BID  ?? losartan  50 mg Per Tube Daily  ?? mouth rinse  15 mL Mouth Rinse 10 times per day  ?? metoprolol tartrate  50  mg Per NG tube BID  ?? nystatin cream   Topical BID  ?? oxyCODONE  10 mg Per Tube Q4H  ?? pantoprazole sodium  40 mg Per Tube Daily  ?? polyethylene glycol  17 g Per Tube Daily  ?? predniSONE  10 mg Per Tube Q breakfast  ?? QUEtiapine  200 mg Per Tube BID  ?? revefenacin  175 mcg Nebulization Daily  ?? sodium chloride flush  10-40 mL Intracatheter Q12H  ?? spironolactone   50 mg Per Tube BID  ? ?Continuous Infusions: ?? sodium chloride 10 mL/hr at 07/03/21 0900  ?? ceFEPime (MAXIPIME) IV Stopped (07/03/21 0556)  ?? dexmedetomidine (PRECEDEX) IV infusion 0.5 mcg/kg/hr (07/03/21 0900)  ?? feeding supplement (VITAL 1.5 CAL) 1,000 mL (07/02/21 1524)  ?? fentaNYL infusion INTRAVENOUS 100 mcg/hr (07/03/21 0900)  ?? norepinephrine (LEVOPHED) Adult infusion Stopped (07/03/21 0645)  ? ?PRN Meds:.sodium chloride, acetaminophen, albuterol, diphenhydrAMINE, fentaNYL, hydrALAZINE, midazolam, ondansetron (ZOFRAN) IV, sodium chloride flush ? ?HPI:   ? ?Gina Bradley is a 61 y.o. female with a past medical history as noted below who initially presented to [facility] on 3/21 with encephalopathy and fever.  She was found to have multifocal pneumonia requiring intubation and vasopressors.  She subsequently was transferred to [facility] on 3/24.  She had difficulty weaning from the ventilator and was trached on 3/28.  On 3/31 she developed fever, hypotension.  She was started on cefepime.  She was also noted to have worsening leukocytosis and developed a new maculopapular rash.  We have been consulted for further recommendations regarding her rash. ? ?Prior to her fever yesterday of 103.3 ?F, she had been borderline febrile since 3/29 when she had a temperature of 100.2 ?F in the evening.  On 3/30 she had a temperature of 100.9 ?F.  This coincided with a slow increase in her white blood cell count from 12.1 on 3/29 which has increased up to 22.9 as of this morning.  She received a course of antibiotics with ceftriaxone  --> Zosyn from 3/20 to 3/27.  Her cultures this admission have been unremarkable with negative blood cultures on 3/21.  She did have blood cultures drawn 3/23 that grew staph hominis in 1 out of 2 cultures.  This was felt to be a contaminant.  Blood cultures were repeated yesterday and are no growth to date.  She has sputum cultures from 3/22 and 3/27 that are negative.  Sputum  cultures were repeated today and are currently pending.  Repeat chest x-ray done this morning shows unchanged bilateral airspace opacities. ? ?Patient had a Foley catheter earlier this admission from 3/21 to 3/26 and 3/28-3/31.  She currently has a external urinary catheter.  She has a urine culture from 3/23 that grew MSSA, however, this is in the setting of recent catheterization. ? ?She has a PICC line that was placed on 3/23 due to difficult vascular access. ? ? ? ? ?Past Medical History:  ?Diagnosis Date  ?? Anemia   ?? Cervical disc disease   ?? COPD (chronic obstructive pulmonary disease) ([place])   ?? Diabetes mellitus without complication ([place])   ?? Essential hypertension   ?? GERD (gastroesophageal reflux disease)   ?? Heart failure ([place]) 2018  ?? History of cardiac catheterization   ? Normal coronaries March 2015  ?? History of non-ST elevation myocardial infarction (NSTEMI)   ? Secondary to SVT  ?? Lymphedema   ?? Peripheral neuropathy   ?? Spinal stenosis   ?? SVT (supraventricular  tachycardia) ([place])   ? ? ?Social History  ? ?Tobacco Use  ?? Smoking status: Former  ?  Packs/day: 1.00  ?  Years: 18.00  ?  Pack years: 18.00  ?  Types: Cigarettes  ?  Start date: 09/17/2003  ?  Quit date: 02/01/2019  ?  Years since quitting: 2.4  ?? Smokeless tobacco: Never  ?Vaping Use  ?? Vaping Use: Never used  ?Substance Use Topics  ?? Alcohol use: No  ?  Alcohol/week: 0.0 standard drinks  ?? Drug use: Yes  ?  Types: Marijuana  ? ? ?Family History  ?Problem Relation Age of Onset  ?? Heart attack Mother   ?? Diabetes Mother   ?? Hypertension Mother   ?? Angina Mother   ?? Diabetes Father   ?? Hypertension Father   ?? Heart failure Brother   ?? Suicidality Sister   ?? Colon cancer Other   ?     Possibly Dad   ?? Stroke Maternal Grandmother   ?? Heart failure Maternal Grandmother   ?? Diabetes Maternal Grandfather   ?? Hypertension Daughter   ?? Hypertension Son   ? ? ?Allergies  ?Allergen Reactions  ?? Bee Venom Other (See  Comments)  ?  Unknown  ?? Tdap [Tetanus-Diphth-Acell Pertussis] Other (See Comments)  ?  Unknown  ? ? ?Review of Systems  ?Unable to perform ROS: Intubated  ? ?OBJECTIVE:  ? ?Blood pressure 101/67, pulse

## 2021-07-03 NOTE — Progress Notes (Signed)
Pharmacy Antibiotic Note ? ?Gina Bradley is a 61 y.o. female admitted on 06/22/2021 with sepsis.  Pharmacy has been consulted to add vancomycin to cefepime dosing. ? ?Febrile up to 103.3 today and currently still febrile at 101.1. WBC is elevated at 22.2. Staph epi found in blood culture, originally thought to be contaminant but now orders received to add back vancomycin.  ? ?Vancomycin 1500 mg IV Q 24 hrs. Goal AUC 400-550. ?Expected AUC: 440 ?SCr used: 0.7 ? ? ?Plan: ?Add back Vancomycin 2g x1 then 1500 mg Q 24 hours ?Continue Cefepime 2g IV q 8 hrs ? ?Height: 5\' 5"  (165.1 cm) ?Weight: 100.4 kg (221 lb 5.5 oz) ?IBW/kg (Calculated) : 57 ? ?Temp (24hrs), Avg:100.3 ?F (37.9 ?C), Min:98.6 ?F (37 ?C), Max:102 ?F (38.9 ?C) ? ?Recent Labs  ?Lab 06/29/21 ?0429 06/29/21 ?0733 06/30/21 ?0452 07/01/21 ?0357 07/02/21 ?0408 07/02/21 ?1242 07/02/21 ?1914 07/03/21 ?0340  ?WBC 10.1  --  12.1* 16.1* 19.5*  --   --  22.2*  22.9*  ?CREATININE  --  0.89 0.70 0.81 0.80  --   --  0.74  ?LATICACIDVEN  --   --   --   --   --  1.2 0.9  --   ? ?  ?Estimated Creatinine Clearance: 87.8 mL/min (by C-G formula based on SCr of 0.74 mg/dL).   ? ?Allergies  ?Allergen Reactions  ? Bee Venom Other (See Comments)  ?  Unknown  ? Tdap [Tetanus-Diphth-Acell Pertussis] Other (See Comments)  ?  Unknown  ? ? ?Antimicrobials this admission: ?Doxy 3/22 >> 3/24 ?CTX 3/21 >>3/25 ?Azith 3/21 x1 dose ?Vanc 3/24 >>3/25, 4/1 ?Zosyn 3/25 >> 3/28 ?Cefepime 3/31 >  ? ?Dose adjustments this admission: ? ?Microbiology results:  ?4/1 TA -  ?3/31 Blood - staph epi ?3/27 TA - rare GPC - normal resp flora ?3/23 BCx -1/4 staph hominis, contaminant ?3/23 UCx >> 1K MSSA, contaminant ?3/22 TA negative ?3/21 legionella/strep pneumo - neg ? ?Thank you for allowing pharmacy to be a part of this patient?s care. ? ?4/21 PharmD., BCPS ?Clinical Pharmacist ?07/03/2021 4:37 PM ? ?

## 2021-07-04 DIAGNOSIS — T827XXD Infection and inflammatory reaction due to other cardiac and vascular devices, implants and grafts, subsequent encounter: Secondary | ICD-10-CM

## 2021-07-04 DIAGNOSIS — J189 Pneumonia, unspecified organism: Secondary | ICD-10-CM | POA: Diagnosis not present

## 2021-07-04 DIAGNOSIS — E1165 Type 2 diabetes mellitus with hyperglycemia: Secondary | ICD-10-CM | POA: Diagnosis not present

## 2021-07-04 DIAGNOSIS — A419 Sepsis, unspecified organism: Secondary | ICD-10-CM | POA: Diagnosis not present

## 2021-07-04 DIAGNOSIS — J9602 Acute respiratory failure with hypercapnia: Secondary | ICD-10-CM | POA: Diagnosis not present

## 2021-07-04 DIAGNOSIS — R7881 Bacteremia: Secondary | ICD-10-CM

## 2021-07-04 DIAGNOSIS — R21 Rash and other nonspecific skin eruption: Secondary | ICD-10-CM

## 2021-07-04 DIAGNOSIS — J9601 Acute respiratory failure with hypoxia: Secondary | ICD-10-CM | POA: Diagnosis not present

## 2021-07-04 LAB — CBC
HCT: 25.8 % — ABNORMAL LOW (ref 36.0–46.0)
Hemoglobin: 7.8 g/dL — ABNORMAL LOW (ref 12.0–15.0)
MCH: 30.7 pg (ref 26.0–34.0)
MCHC: 30.2 g/dL (ref 30.0–36.0)
MCV: 101.6 fL — ABNORMAL HIGH (ref 80.0–100.0)
Platelets: 284 10*3/uL (ref 150–400)
RBC: 2.54 MIL/uL — ABNORMAL LOW (ref 3.87–5.11)
RDW: 16 % — ABNORMAL HIGH (ref 11.5–15.5)
WBC: 19.8 10*3/uL — ABNORMAL HIGH (ref 4.0–10.5)
nRBC: 0.4 % — ABNORMAL HIGH (ref 0.0–0.2)

## 2021-07-04 LAB — MAGNESIUM: Magnesium: 2.3 mg/dL (ref 1.7–2.4)

## 2021-07-04 LAB — BASIC METABOLIC PANEL
Anion gap: 6 (ref 5–15)
BUN: 36 mg/dL — ABNORMAL HIGH (ref 6–20)
CO2: 29 mmol/L (ref 22–32)
Calcium: 8 mg/dL — ABNORMAL LOW (ref 8.9–10.3)
Chloride: 106 mmol/L (ref 98–111)
Creatinine, Ser: 0.61 mg/dL (ref 0.44–1.00)
GFR, Estimated: >60 mL/min (ref >60)
Glucose, Bld: 170 mg/dL — ABNORMAL HIGH (ref 70–99)
Potassium: 4.2 mmol/L (ref 3.5–5.1)
Sodium: 141 mmol/L (ref 135–145)

## 2021-07-04 LAB — GLUCOSE, CAPILLARY
Glucose-Capillary: 168 mg/dL — ABNORMAL HIGH (ref 70–99)
Glucose-Capillary: 174 mg/dL — ABNORMAL HIGH (ref 70–99)
Glucose-Capillary: 184 mg/dL — ABNORMAL HIGH (ref 70–99)
Glucose-Capillary: 238 mg/dL — ABNORMAL HIGH (ref 70–99)
Glucose-Capillary: 230 mg/dL — ABNORMAL HIGH (ref 70–99)
Glucose-Capillary: 200 mg/dL — ABNORMAL HIGH (ref 70–99)

## 2021-07-04 LAB — PROCALCITONIN: Procalcitonin: 0.31 ng/mL

## 2021-07-04 MED ORDER — DEXMEDETOMIDINE HCL IN NACL 400 MCG/100ML IV SOLN
0.0000 ug/kg/h | INTRAVENOUS | Status: AC
  Administered 2021-07-04: 0.6 ug/kg/h via INTRAVENOUS
  Administered 2021-07-04: 0.5 ug/kg/h via INTRAVENOUS
  Administered 2021-07-05: 0.6 ug/kg/h via INTRAVENOUS
  Filled 2021-07-04 (×3): qty 100

## 2021-07-04 NOTE — Progress Notes (Signed)
eLink Physician-Brief Progress Note ?Patient Name: Gina Bradley ?DOB: 01-07-1961 ?MRN: [id number] ? ? ?Date of Service ? 07/04/2021  ?HPI/Events of Note ? Precedex renewal ? ?Camera: ?Trach to Vent. OG tube. ?Obese. ?Stable agitation.  ?eICU Interventions ? Precedex gtt renewed. HR is ok.   ? ? ? ?Intervention Category ?Minor Interventions: Agitation / anxiety - evaluation and management ? ?Ranee Gosselin ?07/04/2021, 10:40 PM ?

## 2021-07-04 NOTE — Progress Notes (Signed)
?   ? ?[facility] ? ?Date of Admission:  06/22/2021    ?       ?Reason for visit: Follow up on rash and fevers ? ?Current antibiotics: ?Cefepime 3/31 -- present ?Vancomycin 3/24; 4/1-present ?  ?Previous antibiotics: ?Azithromycin 3/21 ?Ceftriaxone 3/21-3/25 ?Doxycycline 3/21-3/23 ? ?Zosyn 3/25-3/27 ? ? ?ASSESSMENT:   ? ?61 y.o. female admitted with: ? ?1.  Diffuse maculopapular rash ?2.  Fevers, leukocytosis ?3.  Staph epidermidis positive blood cultures ?4.  Petechial lower extremity lesions and mottled appearance ?5.  Multifocal pneumonia, hypoxic respiratory failure requiring tracheostomy ?6.  Severe sepsis ?7.  Type 2 diabetes ? ?Rash appears stable today and less c/w drug rash.  Drug rash also seems less likely given short latency of onset from drug administration of 1st dose cefepime (although may have been desensitized previously).  CBC differential without eosinophils as well although may be masked by steroids being given.  Favor the possibility of heat rash since developed shortly after temp of 103F and limited to upper body under her gown. ? ?CT appears stable with bilateral opacities but no concerning features of empyema or effusion.  Respiratory cultures are pending and remains on cefepime. ? ?Blood cx may be contaminant vs true bacteremia from PICC line.  Difficult to discern but no other sources really identified. ? ?RECOMMENDATIONS:   ? ?Continue cefepime for now ?Follow respiratory cultures.  If they remain negative and vent settings minimal likely can DC cefepime shortly as CT may suggest radiographic lag and she has received course of therapy for pneumonia.  PCT also low which can support this ?Continue vancomycin  ?Ideally, would remove PICC and get by with PIV or midline for 24-48 hrs prior to placing new line since line infection cannot be ruled out ?Monitor blood cx ?Repeat blood cx once PICC out ?If possible, consider changing to cotton gown to help with heat  trapping from her current hospital attire ?Follow ANA and ANCA labs ?Will follow ? ? ?Principal Problem: ?  Acute respiratory failure with hypoxia and hypercarbia (HCC) ?Active Problems: ?  PSVT (paroxysmal supraventricular tachycardia) ([place]) ?  HTN (hypertension), benign ?  GERD (gastroesophageal reflux disease) ?  Uncontrolled type 2 diabetes mellitus with hyperglycemia, without long-term current use of insulin ([place]) ?  Severe sepsis ([place]) ?  Right subacute Inferior pubic ramus fracture ?  Endometrial thickening on CT scan ?  Chronic heart failure with preserved ejection fraction (HFpEF) ([place]) ?  Acute metabolic encephalopathy ?  Opioid dependence ([place]) ?  Multifocal pneumonia ?  Obesity, Class III, BMI 40-49.9 (morbid obesity) ([place]) ?  Transaminasemia ? ? ? ?MEDICATIONS:   ? ?Scheduled Meds: ?? arformoterol  15 mcg Nebulization BID  ?? aspirin  81 mg Per Tube Daily  ?? atorvastatin  40 mg Per Tube Daily  ?? budesonide (PULMICORT) nebulizer solution  0.25 mg Nebulization BID  ?? chlorhexidine gluconate (MEDLINE KIT)  15 mL Mouth Rinse BID  ?? Chlorhexidine Gluconate Cloth  6 each Topical Q0600  ?? clonazePAM  1 mg Per Tube Q6H  ?? docusate  100 mg Per Tube BID  ?? enoxaparin (LOVENOX) injection  50 mg Subcutaneous Q24H  ?? feeding supplement (PROSource TF)  45 mL Per Tube QID  ?? fluconazole  150 mg Oral Daily  ?? free water  200 mL Per Tube Q6H  ?? furosemide  40 mg Intravenous Daily  ?? insulin aspart  0-20 Units Subcutaneous Q4H  ?? insulin detemir  15 Units Subcutaneous  BID  ?? losartan  50 mg Per Tube Daily  ?? mouth rinse  15 mL Mouth Rinse 10 times per day  ?? metoprolol tartrate  50 mg Per NG tube BID  ?? nystatin cream   Topical BID  ?? oxyCODONE  10 mg Per Tube Q4H  ?? pantoprazole sodium  40 mg Per Tube Daily  ?? polyethylene glycol  17 g Per Tube Daily  ?? predniSONE  10 mg Per Tube Q breakfast  ?? QUEtiapine  200 mg Per Tube BID  ?? revefenacin  175 mcg Nebulization Daily  ?? sodium chloride flush   10-40 mL Intracatheter Q12H  ?? spironolactone  50 mg Per Tube BID  ? ?Continuous Infusions: ?? sodium chloride Stopped (07/04/21 0541)  ?? ceFEPime (MAXIPIME) IV 200 mL/hr at 07/04/21 0600  ?? dexmedetomidine (PRECEDEX) IV infusion 0.3 mcg/kg/hr (07/04/21 0600)  ?? feeding supplement (VITAL 1.5 CAL) 50 mL/hr at 07/03/21 2005  ?? fentaNYL infusion INTRAVENOUS 150 mcg/hr (07/04/21 0600)  ?? norepinephrine (LEVOPHED) Adult infusion Stopped (07/03/21 0645)  ?? vancomycin    ? ?PRN Meds:.sodium chloride, acetaminophen, albuterol, diphenhydrAMINE, fentaNYL, hydrALAZINE, midazolam, ondansetron (ZOFRAN) IV, sodium chloride flush ? ?SUBJECTIVE:  ? ?24 hour events:  ?Febrile overnight, Tmax 101.1 ?Procalcitonin overall improved ?2 out of 4 blood cultures positive for staph epi ?Started on vancomycin empirically ?Continued on cefepime ?CT chest showed multifocal airspace consolidations and groundglass opacities bilaterally.  Suggestive of multifocal pneumonia with slight progression from previous. ?Remains on the ventilator at FiO2 40% ?Not requiring pressors ?PICC line remains in place ?WBC slightly improved ?Differential with 0.1 eosinophils ? ?Remains intubated and sedated ? ?Review of Systems  ?Unable to perform ROS: Intubated  ? ?  ?OBJECTIVE:  ? ?Blood pressure (!) 121/54, pulse 95, temperature 100 ?F (37.8 ?C), temperature source Oral, resp. rate 19, height 5' 5"  (1.651 m), weight 100.4 kg, last menstrual period 01/05/2015, SpO2 91 %. ?Body mass index is 36.83 kg/m?. ? ?Physical Exam ?Constitutional:   ?   Comments: Ill appearing, moving extremities, sedated, on vent  ?HENT:  ?   Head: Normocephalic and atraumatic.  ?Eyes:  ?   Extraocular Movements: Extraocular movements intact.  ?   Conjunctiva/sclera: Conjunctivae normal.  ?Neck:  ?   Comments: + Trach ?Pulmonary:  ?   Comments: Vent settings stable on trach.  Does not appear to be in distress.  ?Abdominal:  ?   General: There is no distension.  ?   Palpations:  Abdomen is soft.  ?   Tenderness: There is no abdominal tenderness.  ?Musculoskeletal:  ?   Cervical back: Normal range of motion and neck supple.  ?   Right lower leg: No edema.  ?   Left lower leg: No edema.  ?Skin: ?   General: Skin is warm and dry.  ?   Findings: Bruising and rash present.  ?Neurological:  ?   General: No focal deficit present.  ?   Mental Status: Mental status is at baseline.  ? ? ? ?Lab Results: ?Lab Results  ?Component Value Date  ? WBC 19.8 (H) 07/04/2021  ? HGB 7.8 (L) 07/04/2021  ? HCT 25.8 (L) 07/04/2021  ? MCV 101.6 (H) 07/04/2021  ? PLT 284 07/04/2021  ?  ?Lab Results  ?Component Value Date  ? NA 141 07/04/2021  ? K 4.2 07/04/2021  ? CO2 29 07/04/2021  ? GLUCOSE 170 (H) 07/04/2021  ? BUN 36 (H) 07/04/2021  ? CREATININE 0.61 07/04/2021  ? CALCIUM 8.0 (L) 07/04/2021  ?  GFRNONAA >60 07/04/2021  ? GFRAA >60 06/29/2019  ?  ?Lab Results  ?Component Value Date  ? ALT 47 (H) 06/27/2021  ? AST 51 (H) 06/27/2021  ? ALKPHOS 74 06/27/2021  ? BILITOT 0.3 06/27/2021  ? ? ?No results found for: CRP ? ?   ?Component Value Date/Time  ? ESRSEDRATE 3 07/29/2009 1233  ? ?  ?I have reviewed the micro and lab results in Epic. ? ?Imaging: ?CT CHEST WO CONTRAST ? ?Result Date: 07/03/2021 ?CLINICAL DATA:  Pneumonia, complication suspected, xray done EXAM: CT CHEST WITHOUT CONTRAST TECHNIQUE: Multidetector CT imaging of the chest was performed following the standard protocol without IV contrast. RADIATION DOSE REDUCTION: This exam was performed according to the departmental dose-optimization program which includes automated exposure control, adjustment of the mA and/or kV according to patient size and/or use of iterative reconstruction technique. COMPARISON:  CT 06/22/2021, x-ray 07/03/2021 FINDINGS: Cardiovascular: Right upper extremity PICC line terminates at the superior cavoatrial junction. Heart size within normal limits. No pericardial effusion. Thoracic aorta is nonaneurysmal. Minimal atherosclerotic  calcification of the aorta and branch vessels. Central pulmonary vasculature appears within normal limits. Mediastinum/Nodes: Enteric tube traverses the esophagus extending into the stomach. Tracheostomy tube appropri

## 2021-07-04 NOTE — Progress Notes (Signed)
? ?NAME:  Gina Bradley, MRN:  [id number], DOB:  [birth year], LOS: 12 ?ADMISSION DATE:  06/22/2021, CONSULTATION DATE:  3/21 ?REFERRING MD:  Wynetta Emery, [facility], CHIEF COMPLAINT:  acute resp failure/ fever wth AMS and SVT ? ?History of Present Illness:  ?61 yo female former smoker presented to [facility] ER on 3/21 with AMS, fever.  Found to have multifocal pneumonia and required intubation and pressors.  Transferred to [facility] for further management. ? ?Pertinent  Medical History  ?Anemia, Back pain, Spinal stenosis COPD, DM type 2, HTN, GERD, CHF, Lymphedema, Neuropathy, SVT ? ?Significant Hospital Events: ?Including procedures, antibiotic start and stop dates in addition to other pertinent events   ?3/21 Admit to [facility], intubated, start pressors, cardiology consult for SVT, start amiodarone ?3/24 transfer to [facility] ?3/28 Trach for failure to wean ?3/31 Fever, hypotension, worsening leukocytosis. Started on levophed and antibiotics ? ?Studies:  ?CT head 3/21 >> mild cerebral atrophy, small-vessel disease and atrophic ventriculomegaly with unremarkable cerebellum and brainstem ?CT angio chest 3/21 >> subacute 3rd/4th rib fractures on Rt, Lt anterior subacute rib fx, multifocal pneumonia with ATX ?CT abd/pelvis 3/21 >> endometrium thickening. Remote L4 compression fracture with spurs and displaced inferior endplate causing biforaminal impingement at L4-5. Subacute right sacral insufficiency fracture with fat stranding and callus. Healing right obturator ring ?fractures. Healing left third through fifth lumbar transverse process fractures with callus. Nondisplaced left pubic body fracture. ? ?Interim History / Subjective:  ? ?Febrile x 48 hours ?ID at bedside. Examined rash which appears stable and based on location may be related to heat rash ? ?Objective   ?Blood pressure (!) 121/54, pulse 95, temperature 100.1 ?F (37.8 ?C), temperature source Oral, resp. rate 19, height 5\' 5"  (1.651 m), weight 100.4 kg, last menstrual period  01/05/2015, SpO2 91 %. ?   ?Vent Mode: PRVC ?FiO2 (%):  [40 %-50 %] 40 % ?Set Rate:  [20 bmp] 20 bmp ?Vt Set:  [460 mL] 460 mL ?PEEP:  [5 cmH20] 5 cmH20 ?Plateau Pressure:  [15 cmH20] 15 cmH20  ? ?Intake/Output Summary (Last 24 hours) at 07/04/2021 0827 ?Last data filed at 07/04/2021 0600 ?Gross per 24 hour  ?Intake 4526.56 ml  ?Output 1600 ml  ?Net 2926.56 ml  ? ?Filed Weights  ? 07/02/21 0500 07/03/21 0339 07/04/21 0500  ?Weight: 97.3 kg 100.4 kg 100.4 kg  ? ?   ? ?Physical Exam: ?General: Chronically ill-appearing, intermittently agitated ?HENT: [place], AT, OP clear, MMM ?Neck: Trach in place, c/d/i ?Eyes: EOMI, no scleral icterus ?Respiratory: Clear to auscultation bilaterally.  No crackles, wheezing or rales ?Cardiovascular: RRR, -M/R/G, no JVD ?GI: BS+, soft, nontender ?Extremities:-Edema,-tenderness ?Neuro: Eyes closed, PERRL, no tracking, does not follow commands, moves extremities x 4 ?GU: Foley in place ? ?CXR 07/03/21 - Unchanged bilateral opacities ? ?Resolved Problems:  ?Septic shock, Elevated troponin from demand ischemia ? ?Assessment & Plan:  ?Acute metabolic encephalopathy ?Hx of peripheral neuropathy, back pain, chronic opiate use. ?- UDS positive for barbiturates ?- Wean Fentanyl and Precedex for RASS goal 0 and -1 ?- Continue PO Oxy, Seroquel and Klonopin   ?- hold outpt neurontin, robaxin, norco ? ?Acute on chronic hypoxic/hypercapnic respiratory failure from multifocal bacterial community acquired pneumonia (POA). ?COPD with acute exacerbation. ?S/p trach 3/28 for failure to wean ?- Full vent support ?- Daily SBT/WUA. Has previously failed SBT. Did tolerate yesterday 3/30 however has failed since then ?- pulmicort, brovana, yupelri ?- prn albuterol ?- Prednisone 10 mg ?- VAP ? ?Fever, rash  ?Recently treated for CAP/aspiration  pna. Repeat respiratory and blood culture unremarkable. Worsening leukocytosis. Briefly required vasopressors 3/31.  CXR unchanged and on minimal vent settings. CT Chest with  pneumonia but no evidence of empyema or necrosis ?Bcx 3/31 Staph epidermidis, likely contaminant ?- Started 3/31 Cefepime ?- F/u final blood cultures. Repeat trach aspirate ?- Appreciate ID input. Will plan for line holiday as no other clear sources of infection. Will remove PICC once midline placed ? ?Paroxysmal SVT. ?Acute on chronic diastolic CHF, exacerbated by SVT ?HTN, HLD, CAD ?- Appreciate Cardiology recs. On BB. Discontinued amio  ?- Lasix 40 mg daily and spironolactone BID. Goal net even/neg daily ?- Continue ASA, lipitor, aldactone, losartan ?- Continue Clonidine for BP control and opioid withdrawal sx ?- hold outpt isosorbide.  ? ?Anemia of critical illness. ?- f/u CBC ?- transfuse for Hb < 7 ? ?DM type 2 poorly controlled with steroid induced hyperglycemia. ?CBG slowly trending up ?-Continue Levemir to 15 units BID ?- SSI ? ?Endometrial thickening on CT scan. ?- will need outpt f/u with Gyn ? ?Rt subacute inferior pubic ramus fracture. ?- seen in ED on 06/06/21 and scheduled for outpt orthopedic follow up ? ?Best Practice (right click and "Reselect all SmartList Selections" daily)  ?DVT: lovenox ?SUP: protonix ?Diet: tube feeds ?Lines: Rt PICC 3/23 ?Foley: Yes. 3 days of bethanechol and voiding trial again ?Goals of care: Full code. Updated husband 3/31 via telephone ? ?Labs   ? ? ?  Latest Ref Rng & Units 07/04/2021  ?  3:33 AM 07/03/2021  ?  3:40 AM 07/02/2021  ?  4:08 AM  ?CMP  ?Glucose 70 - 99 mg/dL 170   255   207    ?BUN 6 - 20 mg/dL 36   42   42    ?Creatinine 0.44 - 1.00 mg/dL 0.61   0.74   0.80    ?Sodium 135 - 145 mmol/L 141   140   143    ?Potassium 3.5 - 5.1 mmol/L 4.2   4.2   3.7    ?Chloride 98 - 111 mmol/L 106   103   102    ?CO2 22 - 32 mmol/L 29   30   32    ?Calcium 8.9 - 10.3 mg/dL 8.0   8.1   8.3    ? ? ? ?  Latest Ref Rng & Units 07/04/2021  ?  3:33 AM 07/03/2021  ? 10:18 AM 07/03/2021  ?  3:40 AM  ?CBC  ?WBC 4.0 - 10.5 K/uL 19.8    22.9    ? 22.2    ?Hemoglobin 12.0 - 15.0 g/dL 7.8    9.2    ?  8.5    ?Hematocrit 36.0 - 46.0 % 25.8    29.9    ? 27.9    ?Platelets 150 - 400 K/uL 284   280   275    ? 293    ? ? ?ABG ?   ?Component Value Date/Time  ? PHART 7.423 06/29/2021 0820  ? PCO2ART 57.3 (H) 06/29/2021 0820  ? PO2ART 92 06/29/2021 0820  ? HCO3 37.4 (H) 06/29/2021 0820  ? TCO2 39 (H) 06/29/2021 0820  ? ACIDBASEDEF 0.6 06/24/2021 1855  ? O2SAT 97 06/29/2021 0820  ? ? ?CBG (last 3)  ?Recent Labs  ?  07/03/21 ?[id number] 07/04/21 ?0319 07/04/21 ?0719  ?GLUCAP 164* 168* 174*  ? ?Critical care time: 45 min  ? ?The patient is critically ill with multiple organ systems failure and requires high complexity  decision making for assessment and support, frequent evaluation and titration of therapies, application of advanced monitoring technologies and extensive interpretation of multiple databases.  Independent Critical Care Time: 50 Minutes.  ? ?Rodman Pickle, M.D. ?[facility] ?07/04/2021 8:27 AM  ? ?Please see Amion for pager number to reach on-call Pulmonary and Critical Care Team. ? ? ?

## 2021-07-05 ENCOUNTER — Inpatient Hospital Stay (HOSPITAL_COMMUNITY): Payer: 59

## 2021-07-05 DIAGNOSIS — J441 Chronic obstructive pulmonary disease with (acute) exacerbation: Secondary | ICD-10-CM

## 2021-07-05 DIAGNOSIS — M7989 Other specified soft tissue disorders: Secondary | ICD-10-CM | POA: Diagnosis not present

## 2021-07-05 DIAGNOSIS — T827XXD Infection and inflammatory reaction due to other cardiac and vascular devices, implants and grafts, subsequent encounter: Secondary | ICD-10-CM | POA: Diagnosis not present

## 2021-07-05 DIAGNOSIS — Z9911 Dependence on respirator [ventilator] status: Secondary | ICD-10-CM

## 2021-07-05 DIAGNOSIS — G9341 Metabolic encephalopathy: Secondary | ICD-10-CM | POA: Diagnosis not present

## 2021-07-05 DIAGNOSIS — J9601 Acute respiratory failure with hypoxia: Secondary | ICD-10-CM | POA: Diagnosis not present

## 2021-07-05 DIAGNOSIS — A419 Sepsis, unspecified organism: Secondary | ICD-10-CM | POA: Diagnosis not present

## 2021-07-05 DIAGNOSIS — J189 Pneumonia, unspecified organism: Secondary | ICD-10-CM | POA: Diagnosis not present

## 2021-07-05 LAB — ANCA TITERS
Atypical P-ANCA titer: <1:20 {titer}
C-ANCA: <1:20 {titer}
P-ANCA: <1:20 {titer}

## 2021-07-05 LAB — CBC
HCT: 26.3 % — ABNORMAL LOW (ref 36.0–46.0)
Hemoglobin: 8.2 g/dL — ABNORMAL LOW (ref 12.0–15.0)
MCH: 30.9 pg (ref 26.0–34.0)
MCHC: 31.2 g/dL (ref 30.0–36.0)
MCV: 99.2 fL (ref 80.0–100.0)
Platelets: 285 10*3/uL (ref 150–400)
RBC: 2.65 MIL/uL — ABNORMAL LOW (ref 3.87–5.11)
RDW: 16.1 % — ABNORMAL HIGH (ref 11.5–15.5)
WBC: 21 10*3/uL — ABNORMAL HIGH (ref 4.0–10.5)
nRBC: 1 % — ABNORMAL HIGH (ref 0.0–0.2)

## 2021-07-05 LAB — PROCALCITONIN: Procalcitonin: 0.28 ng/mL

## 2021-07-05 LAB — CULTURE, RESPIRATORY W GRAM STAIN: Culture: NORMAL

## 2021-07-05 LAB — CULTURE, BLOOD (ROUTINE X 2): Special Requests: ADEQUATE

## 2021-07-05 LAB — ANA W/REFLEX IF POSITIVE: Anti Nuclear Antibody (ANA): NEGATIVE

## 2021-07-05 LAB — GLUCOSE, CAPILLARY
Glucose-Capillary: 189 mg/dL — ABNORMAL HIGH (ref 70–99)
Glucose-Capillary: 207 mg/dL — ABNORMAL HIGH (ref 70–99)
Glucose-Capillary: 224 mg/dL — ABNORMAL HIGH (ref 70–99)
Glucose-Capillary: 220 mg/dL — ABNORMAL HIGH (ref 70–99)
Glucose-Capillary: 145 mg/dL — ABNORMAL HIGH (ref 70–99)
Glucose-Capillary: 153 mg/dL — ABNORMAL HIGH (ref 70–99)

## 2021-07-05 LAB — MAGNESIUM: Magnesium: 2 mg/dL (ref 1.7–2.4)

## 2021-07-05 LAB — TRIGLYCERIDES: Triglycerides: 191 mg/dL — ABNORMAL HIGH (ref <150)

## 2021-07-05 LAB — BASIC METABOLIC PANEL
Anion gap: 6 (ref 5–15)
BUN: 25 mg/dL — ABNORMAL HIGH (ref 6–20)
CO2: 27 mmol/L (ref 22–32)
Calcium: 8.6 mg/dL — ABNORMAL LOW (ref 8.9–10.3)
Chloride: 108 mmol/L (ref 98–111)
Creatinine, Ser: 0.57 mg/dL (ref 0.44–1.00)
GFR, Estimated: >60 mL/min (ref >60)
Glucose, Bld: 206 mg/dL — ABNORMAL HIGH (ref 70–99)
Potassium: 4.2 mmol/L (ref 3.5–5.1)
Sodium: 141 mmol/L (ref 135–145)

## 2021-07-05 MED ORDER — GABAPENTIN 250 MG/5ML PO SOLN
400.0000 mg | Freq: Three times a day (TID) | ORAL | Status: DC
  Administered 2021-07-05 – 2021-07-07 (×5): 400 mg
  Filled 2021-07-05 (×6): qty 8

## 2021-07-05 MED ORDER — GABAPENTIN 250 MG/5ML PO SOLN
400.0000 mg | Freq: Three times a day (TID) | ORAL | Status: DC
  Administered 2021-07-05: 400 mg via ORAL
  Filled 2021-07-05 (×3): qty 8

## 2021-07-05 MED ORDER — DIPHENHYDRAMINE HCL 25 MG PO CAPS
25.0000 mg | ORAL_CAPSULE | Freq: Four times a day (QID) | ORAL | Status: DC | PRN
  Administered 2021-07-18 – 2021-07-22 (×3): 25 mg
  Filled 2021-07-05 (×3): qty 1

## 2021-07-05 MED ORDER — FLUCONAZOLE 200 MG PO TABS
200.0000 mg | ORAL_TABLET | Freq: Every day | ORAL | Status: DC
  Administered 2021-07-05: 200 mg
  Filled 2021-07-05 (×2): qty 1

## 2021-07-05 MED ORDER — FLUCONAZOLE 200 MG PO TABS
200.0000 mg | ORAL_TABLET | Freq: Every day | ORAL | Status: DC
  Filled 2021-07-05: qty 1

## 2021-07-05 MED ORDER — INSULIN ASPART 100 UNIT/ML IJ SOLN
3.0000 [IU] | INTRAMUSCULAR | Status: DC
  Administered 2021-07-05 – 2021-07-06 (×6): 3 [IU] via SUBCUTANEOUS

## 2021-07-05 MED ORDER — METOPROLOL TARTRATE 12.5 MG HALF TABLET
12.5000 mg | ORAL_TABLET | Freq: Two times a day (BID) | ORAL | Status: DC
  Administered 2021-07-05 – 2021-07-11 (×14): 12.5 mg
  Filled 2021-07-05 (×14): qty 1

## 2021-07-05 MED ORDER — SPIRONOLACTONE 25 MG PO TABS
100.0000 mg | ORAL_TABLET | Freq: Two times a day (BID) | ORAL | Status: DC
  Administered 2021-07-06 – 2021-07-08 (×5): 100 mg
  Filled 2021-07-05 (×5): qty 4

## 2021-07-05 MED ORDER — SPIRONOLACTONE 25 MG PO TABS: 50.0000 mg | ORAL_TABLET | Freq: Once | ORAL | Status: AC

## 2021-07-05 NOTE — Progress Notes (Signed)
? ?NAME:  Gina Bradley, MRN:  [id number], DOB:  Aug 21, 1960, LOS: 13 ?ADMISSION DATE:  06/22/2021, CONSULTATION DATE:  3/21 ?REFERRING MD:  Laural Benes, [facility], CHIEF COMPLAINT:  acute resp failure/ fever wth AMS and SVT ? ?History of Present Illness:  ?61 yo female former smoker presented to [facility] ER on 3/21 with AMS, fever.  Found to have multifocal pneumonia and required intubation and pressors.  Transferred to [facility] for further management. ? ?Pertinent  Medical History  ?Anemia, Back pain, Spinal stenosis COPD, DM type 2, HTN, GERD, CHF, Lymphedema, Neuropathy, SVT ? ?Significant Hospital Events: ?Including procedures, antibiotic start and stop dates in addition to other pertinent events   ?3/21 Admit to [facility], intubated, start pressors, cardiology consult for SVT, start amiodarone ?3/24 transfer to [facility] ?3/28 Trach for failure to wean ?3/31 Fever, hypotension, worsening leukocytosis. Started on levophed and antibiotics. ?4/2 PICC removed for line holiday ? ?Interim History / Subjective:  ?Agitated when sedation paused this morning, starting to respond to her name. PICC removed 4/2. ? ?Objective   ?Blood pressure (!) 107/57, pulse 100, temperature 99.8 ?F (37.7 ?C), temperature source Axillary, resp. rate (!) 23, height 5\' 5"  (1.651 m), weight 100.4 kg, last menstrual period 01/05/2015, SpO2 95 %. ?   ?Vent Mode: CPAP;PSV ?FiO2 (%):  [40 %-50 %] 40 % ?Set Rate:  [20 bmp] 20 bmp ?Vt Set:  [460 mL] 460 mL ?PEEP:  [5 cmH20] 5 cmH20 ?Pressure Support:  [10 cmH20] 10 cmH20 ?Plateau Pressure:  [19 cmH20-20 cmH20] 19 cmH20  ? ?Intake/Output Summary (Last 24 hours) at 07/05/2021 1004 ?Last data filed at 07/05/2021 0600 ?Gross per 24 hour  ?Intake 2305.6 ml  ?Output 1726 ml  ?Net 579.6 ml  ? ? ?Filed Weights  ? 07/03/21 0339 07/04/21 0500 07/05/21 0500  ?Weight: 100.4 kg 100.4 kg 100.4 kg  ? ?  Physical Exam: ?General: critically ill appearing woman lying in bed sedated on fentanyl ?HENT: [place]/AT, eyes anicteric ?Neck: Trach in place, no  bleeding or erythema around stoma ?Eyes: Eyes anicteric ?Respiratory: rhonchi bilaterally, thin clear secretions ?Cardiovascular: tachycardic, reg rhythm ?GI: soft, NT ?Extremities: minimal edema, no cyanosis ?Neuro: agitated, turns to her to track when her name is said, moving extremities spontaneously but not following commands. ?Skin: bruising throughout her body-- bruises of varying age. Petechiae on legs,  erythematous diffuse rash on flanks and inframammary fold ? ?BUN 25 ?Cr 0.57 ?WBC 21 ?H/h 8.2/26.3 ?D-dimer 7.8 ?Fibrinogen 656 ?Blood cultures 3/31>  2/4 (1 site) staph epi ?Resp culture 4/1> NGTD ? ?BG 170-250s ? ?Resolved Problems:  ?Septic shock, Elevated troponin from demand ischemia ?Urinary retention ? ?Assessment & Plan:  ?Acute metabolic encephalopathy; UDS at presentation positive for barbiturates ?Hx of peripheral neuropathy, back pain, chronic opiate use ?-PAD protocol, continue fentanyl and Precedex as required to maintain goal RASS 0 to -1. Weaning off as able. ?-resume 50% of PTA dose of gabapentin due to concern for potential withdrawal ?-continue enteral oxycodone, Seroquel, clonazepam ?-Continue holding outpatient Robaxin, Norco ?-Per [employer], has to be off sedation infusions before she would be approved for LTAC.  Turndown and peer to peer on 07/05/2021. ? ?Acute on chronic hypoxic & hypercapnic respiratory failure from multifocal bacterial community acquired pneumonia (POA). ?COPD with acute exacerbation ?S/p trach 3/28 for failure to wean ?-Low tidal volume ventilation, 4 to 8 cc/kg ideal body with goal plateaus and 30 driving pressure less than 15 ?- PAD protocol ?- VAP prevention protocol ?- Daily SAT and SBT-hopefully does not  need ongoing high sedation given that she has tracheostomy.  Adding gabapentin.  Continue vent weaning efforts. ?- Routine trach care ?- Continue Pulmicort, Brovana, Yupelri plus albuterol as needed ?-Continue prednisone 10 mg> home medication ? ?Fever,  rash  ?Recently treated for CAP/aspiration pna. Repeat respiratory and blood culture unremarkable. Worsening leukocytosis. Briefly required vasopressors 3/31.  CXR unchanged and on minimal vent settings ?-Continue cefepime, vancomycin ?-Repeat blood cultures today, continue to follow cultures until finalized ?-Appreciate ID team's input ?-ANA & ANCA pending since 4/1 ?-Line holiday from PICC since 4/2 ? ?Paroxysmal SVT ?Acute on chronic HFpEF exacerbated by SVT ?HTN, HLD, CAD ?--Beta-blocker resumed, previously held due to shock.  Previously on amiodarone this admission. ?-Lasix daily + switching spironolactone to once daily ?-Continue aspirin, statin ?-Con't holding losartan, imdur ? ?Anemia of critical illness, suspect she was hemoconcentrated at admission ?-Monitor-transfuse for hemoglobin less than 7 or hemodynamically significant bleeding ?-Monitor ? ?DM type 2 poorly controlled with steroid induced hyperglycemia ?-Adding tube feed coverage-3 units every 4 hours with hold parameters ?- Continue detemir 15 units twice daily ?- Sliding scale as needed ?- Goal BG 140-180 ? ?Endometrial thickening on CT scan ?-Needs outpatient gynecology follow-up ? ?Rt subacute inferior pubic ramus fracture ?- seen in ED on 06/06/21 and scheduled for outpt orthopedic follow up ? ?Elevated d-dimer ?-LE [place] ordered ? ?Peer to peer--turned down due to still needing sedative via infusion, needs failed SBT three different days then can re-submit. May also need PEG before she would be approved. ? ? ?Best Practice (right click and "Reselect all SmartList Selections" daily)  ?DVT: lovenox ?SUP: protonix ?Diet: tube feeds ?Lines: PIV ?Foley: no ?Goals of care: Full code. Updated husband 3/31 via telephone ? ?Labs   ? ? ?  Latest Ref Rng & Units 07/05/2021  ?  7:56 AM 07/04/2021  ?  3:33 AM 07/03/2021  ?  3:40 AM  ?CMP  ?Glucose 70 - 99 mg/dL 206   170   255    ?BUN 6 - 20 mg/dL 25   36   42    ?Creatinine 0.44 - 1.00 mg/dL 0.57   0.61   0.74     ?Sodium 135 - 145 mmol/L 141   141   140    ?Potassium 3.5 - 5.1 mmol/L 4.2   4.2   4.2    ?Chloride 98 - 111 mmol/L 108   106   103    ?CO2 22 - 32 mmol/L 27   29   30     ?Calcium 8.9 - 10.3 mg/dL 8.6   8.0   8.1    ? ? ? ?  Latest Ref Rng & Units 07/05/2021  ?  7:56 AM 07/04/2021  ?  3:33 AM 07/03/2021  ? 10:18 AM  ?CBC  ?WBC 4.0 - 10.5 K/uL 21.0   19.8     ?Hemoglobin 12.0 - 15.0 g/dL 8.2   7.8     ?Hematocrit 36.0 - 46.0 % 26.3   25.8     ?Platelets 150 - 400 K/uL 285   284   280    ? ? ?ABG ?   ?Component Value Date/Time  ? PHART 7.423 06/29/2021 0820  ? PCO2ART 57.3 (H) 06/29/2021 0820  ? PO2ART 92 06/29/2021 0820  ? HCO3 37.4 (H) 06/29/2021 0820  ? TCO2 39 (H) 06/29/2021 0820  ? ACIDBASEDEF 0.6 06/24/2021 1855  ? O2SAT 97 06/29/2021 0820  ? ? ?CBG (last 3)  ?Recent Labs  ?  07/04/21 ?2325 07/05/21 ?0418 07/05/21 ?0740  ?GLUCAP 200* 189* 207*  ? ? ?This patient is critically ill with multiple organ system failure which requires frequent high complexity decision making, assessment, support, evaluation, and titration of therapies. This was completed through the application of advanced monitoring technologies and extensive interpretation of multiple databases. During this encounter critical care time was devoted to patient care services described in this note for 55 minutes. ? ?Julian Hy, DO 07/05/21 11:08 AM ?[facility] ? ? ?

## 2021-07-05 NOTE — TOC Progression Note (Signed)
Transition of Care (TOC) - Progression Note  ? ? ?Patient Details  ?Name: Gina Bradley ?MRN: [id number] ?Date of Birth: April 14, 1960 ? ?Transition of Care (TOC) CM/SW Contact  ?Tom-Johnson, Hershal Coria, RN ?Phone Number: ?07/05/2021, 3:47 PM ? ?Clinical Narrative:    ? ?CM notified by Irving Burton with [facility] that Peer to Peer was declined. Irving Burton requested MD's email to fill out a form for appeal. CM notified MD. Email sent to [facility]. MD notified CM that patient is not off sedation and not medically ready for discharge and is seen by multiple specialists at this time. Irving Burton notified. CM will continue to follow with needs.  ? ?Expected Discharge Plan: Long Term Acute Care (LTAC) ?Barriers to Discharge: Continued Medical Work up ? ?Expected Discharge Plan and Services ?Expected Discharge Plan: Long Term Acute Care (LTAC) ?  ?Discharge Planning Services: CM Consult ?Post Acute Care Choice:  (LTACH) ?Living arrangements for the past 2 months: Single Family Home ?                ?  ?  ?  ?  ?  ?  ?  ?  ?  ?  ? ? ?Social Determinants of Health (SDOH) Interventions ?  ? ?Readmission Risk Interventions ?   ? View : No data to display.  ?  ?  ?  ? ? ?

## 2021-07-05 NOTE — Progress Notes (Signed)
VASCULAR LAB ? ? ? ?Bilateral lower extremity venous duplex has been performed. ? ?See CV proc for preliminary results. ? ? ?Sherren Kerns, RVT ?07/05/2021, 12:33 PM ? ?

## 2021-07-05 NOTE — Progress Notes (Signed)
PT Cancellation Note ? ?Patient Details ?Name: Gina Bradley ?MRN: [id number] ?DOB: 08/19/1960 ? ? ?Cancelled Treatment:    Reason Eval/Treat Not Completed: (P) Medical issues which prohibited therapy Pt remains sedated and is not following commands. PT will follow back tomorrow as pt's sedation is being decreased today. ? ?[name] B. Beverely Risen PT, DPT ?Acute Rehabilitation Services ?Pager [phone] ?Office [phone] ? ? ? ?[name] ?07/05/2021, 10:30 AM ? ? ?

## 2021-07-05 NOTE — Progress Notes (Signed)
?   ? ?[name] for Infectious Disease ? ?Date of Admission:  06/22/2021    ?       ?Reason for visit: Follow up on rash and fevers ? ?Current antibiotics: ?Cefepime 3/31 -- present ?Vancomycin 3/24; 4/1-present ?  ?Previous antibiotics: ?Azithromycin 3/21 ?Ceftriaxone 3/21-3/25 ?Doxycycline 3/21-3/23 ?Zosyn 3/25-3/27 ? ?ASSESSMENT:   ? ?61 y.o. female admitted with: ? ?Diffuse maculopapular rash: Unclear etiology however does not appear consistent with drug rash as this has improved while maintaining coverage with cefepime.  Additionally seems less likely drug rash given short latency of onset from drug administration of first dose cefepime.  Suspect this may have been a heat rash related to her hospital gown given based on the location and overall improvement. ?Fevers, leukocytosis ?Staph epidermidis positive blood culture: Difficult to discern if contaminant versus true bacteremia from PICC line.  Only 1 out of 2 cultures positive which would suggest contaminant but no other infectious sources identified.  PICC line was removed 4/2. ?Multifocal pneumonia, hypoxic respiratory failure requiring tracheostomy: Repeat CT with stability of bilateral opacities and no concerning features of empyema or effusion.  Previously received a course of antibiotics for CAP/aspiration.  Repeat tracheal aspirate cultures are pending.  Remains on cefepime with stable vent settings. ?Severe sepsis: Appears improved. ? ?RECOMMENDATIONS:   ? ?Continue cefepime ?Follow tracheal aspirate cultures.  If they remain negative and vent settings stable will likely DC shortly ?Continue vancomycin ?Repeat blood cultures now that PICC line is removed ?Will follow ? ? ?Principal Problem: ?  Acute respiratory failure with hypoxia and hypercarbia (HCC) ?Active Problems: ?  PSVT (paroxysmal supraventricular tachycardia) ([place]) ?  HTN (hypertension), benign ?  GERD (gastroesophageal reflux disease) ?  Uncontrolled type 2 diabetes mellitus with  hyperglycemia, without long-term current use of insulin ([name]) ?  Severe sepsis ([name]) ?  Right subacute Inferior pubic ramus fracture ?  Endometrial thickening on CT scan ?  Chronic heart failure with preserved ejection fraction (HFpEF) ([name]) ?  Acute metabolic encephalopathy ?  Opioid dependence ([name]) ?  Multifocal pneumonia ?  Obesity, Class III, BMI 40-49.9 (morbid obesity) ([name]) ?  Transaminasemia ? ? ? ?MEDICATIONS:   ? ?Scheduled Meds: ?? arformoterol  15 mcg Nebulization BID  ?? aspirin  81 mg Per Tube Daily  ?? atorvastatin  40 mg Per Tube Daily  ?? budesonide (PULMICORT) nebulizer solution  0.25 mg Nebulization BID  ?? chlorhexidine gluconate (MEDLINE KIT)  15 mL Mouth Rinse BID  ?? Chlorhexidine Gluconate Cloth  6 each Topical Q0600  ?? clonazePAM  1 mg Per Tube Q6H  ?? docusate  100 mg Per Tube BID  ?? enoxaparin (LOVENOX) injection  50 mg Subcutaneous Q24H  ?? feeding supplement (PROSource TF)  45 mL Per Tube QID  ?? fluconazole  200 mg Per Tube Daily  ?? free water  200 mL Per Tube Q6H  ?? insulin aspart  0-20 Units Subcutaneous Q4H  ?? insulin detemir  15 Units Subcutaneous BID  ?? mouth rinse  15 mL Mouth Rinse 10 times per day  ?? nystatin cream   Topical BID  ?? oxyCODONE  10 mg Per Tube Q4H  ?? pantoprazole sodium  40 mg Per Tube Daily  ?? polyethylene glycol  17 g Per Tube Daily  ?? predniSONE  10 mg Per Tube Q breakfast  ?? QUEtiapine  200 mg Per Tube BID  ?? revefenacin  175 mcg Nebulization Daily  ?? spironolactone  50 mg Per Tube BID  ? ?  Continuous Infusions: ?? sodium chloride Stopped (07/05/21 0534)  ?? ceFEPime (MAXIPIME) IV 200 mL/hr at 07/05/21 0600  ?? feeding supplement (VITAL 1.5 CAL) 1,000 mL (07/04/21 1621)  ?? fentaNYL infusion INTRAVENOUS 150 mcg/hr (07/05/21 0600)  ?? vancomycin Stopped (07/04/21 2003)  ? ?PRN Meds:.sodium chloride, acetaminophen, albuterol, diphenhydrAMINE, fentaNYL, hydrALAZINE, midazolam, ondansetron (ZOFRAN) IV ? ?SUBJECTIVE:  ? ?24 hour events:  ?Status post  PICC line removal ?Continues on vancomycin ?Continues on cefepime ?Fever curve appears improved, Tmax 99.8 ?WBC stable at 21 ?Respiratory cultures unrevealing ?Peripheral blood cultures still 1 out of 2 positive for staph epi from 3/31 ? ?Patient seen at the bedside.  Nursing reports that her rash appears less angry and improved today.  She still reports problems with vaginal yeast.  PICC line was removed and PIV access was obtained.  No other issues. ? ?Review of Systems  ?Unable to perform ROS: Intubated  ? ?  ?OBJECTIVE:  ? ?Blood pressure (!) 141/80, pulse (!) 127, temperature 99.8 ?F (37.7 ?C), temperature source Axillary, resp. rate 20, height 5' 5"  (1.651 m), weight 100.4 kg, last menstrual period 01/05/2015, SpO2 94 %. ?Body mass index is 36.83 kg/m?. ? ?Physical Exam ?Constitutional:   ?   Comments: Ill-appearing woman, lying in bed, no acute distress, stable agitation.  ?HENT:  ?   Head: Normocephalic and atraumatic.  ?Eyes:  ?   Extraocular Movements: Extraocular movements intact.  ?   Conjunctiva/sclera: Conjunctivae normal.  ?Neck:  ?   Comments: Status post tracheostomy ?Abdominal:  ?   General: There is no distension.  ?   Palpations: Abdomen is soft.  ?   Tenderness: There is no abdominal tenderness.  ?Musculoskeletal:  ?   Cervical back: Normal range of motion and neck supple.  ?   Comments: Right upper extremity PICC line has been removed.  ?Skin: ?   General: Skin is warm and dry.  ?   Findings: Bruising and rash present.  ?   Comments: Her upper torso rash appears improved today.  ?Neurological:  ?   Mental Status: Mental status is at baseline.  ? ? ? ?Lab Results: ?Lab Results  ?Component Value Date  ? WBC 21.0 (H) 07/05/2021  ? HGB 8.2 (L) 07/05/2021  ? HCT 26.3 (L) 07/05/2021  ? MCV 99.2 07/05/2021  ? PLT 285 07/05/2021  ?  ?Lab Results  ?Component Value Date  ? NA 141 07/05/2021  ? K 4.2 07/05/2021  ? CO2 27 07/05/2021  ? GLUCOSE 206 (H) 07/05/2021  ? BUN 25 (H) 07/05/2021  ? CREATININE 0.57  07/05/2021  ? CALCIUM 8.6 (L) 07/05/2021  ? GFRNONAA >60 07/05/2021  ? GFRAA >60 06/29/2019  ?  ?Lab Results  ?Component Value Date  ? ALT 47 (H) 06/27/2021  ? AST 51 (H) 06/27/2021  ? ALKPHOS 74 06/27/2021  ? BILITOT 0.3 06/27/2021  ? ? ?No results found for: CRP ? ?   ?Component Value Date/Time  ? ESRSEDRATE 3 07/29/2009 1233  ? ?  ?I have reviewed the micro and lab results in Epic. ? ?Imaging: ?CT CHEST WO CONTRAST ? ?Result Date: 07/03/2021 ?CLINICAL DATA:  Pneumonia, complication suspected, xray done EXAM: CT CHEST WITHOUT CONTRAST TECHNIQUE: Multidetector CT imaging of the chest was performed following the standard protocol without IV contrast. RADIATION DOSE REDUCTION: This exam was performed according to the departmental dose-optimization program which includes automated exposure control, adjustment of the mA and/or kV according to patient size and/or use of iterative reconstruction technique. COMPARISON:  CT  06/22/2021, x-ray 07/03/2021 FINDINGS: Cardiovascular: Right upper extremity PICC line terminates at the superior cavoatrial junction. Heart size within normal limits. No pericardial effusion. Thoracic aorta is nonaneurysmal. Minimal atherosclerotic calcification of the aorta and branch vessels. Central pulmonary vasculature appears within normal limits. Mediastinum/Nodes: Enteric tube traverses the esophagus extending into the stomach. Tracheostomy tube appropriately positioned. No axillary or mediastinal lymphadenopathy. Evaluation of the hilar structures is limited in the absence of intravenous contrast. Within this limitation, no obvious hilar adenopathy or mass is identified. Lungs/Pleura: Extensive multifocal patchy airspace consolidations and ground-glass opacity throughout both lungs, most pronounced within the perihilar, lingular, and right basilar regions. No pleural effusion or pneumothorax. Upper Abdomen: No acute abnormality. Musculoskeletal: Numerous subacute to chronic bilateral rib  fractures. Thoracic vertebral body heights and alignment are maintained. Chest wall abnormality. IMPRESSION: 1. Extensive multifocal patchy airspace consolidations and ground-glass opacity throughout both lungs

## 2021-07-06 DIAGNOSIS — T827XXD Infection and inflammatory reaction due to other cardiac and vascular devices, implants and grafts, subsequent encounter: Secondary | ICD-10-CM | POA: Diagnosis not present

## 2021-07-06 DIAGNOSIS — A419 Sepsis, unspecified organism: Secondary | ICD-10-CM | POA: Diagnosis not present

## 2021-07-06 DIAGNOSIS — J9601 Acute respiratory failure with hypoxia: Secondary | ICD-10-CM | POA: Diagnosis not present

## 2021-07-06 DIAGNOSIS — J9602 Acute respiratory failure with hypercapnia: Secondary | ICD-10-CM | POA: Diagnosis not present

## 2021-07-06 DIAGNOSIS — Z930 Tracheostomy status: Secondary | ICD-10-CM | POA: Diagnosis not present

## 2021-07-06 DIAGNOSIS — J189 Pneumonia, unspecified organism: Secondary | ICD-10-CM | POA: Diagnosis not present

## 2021-07-06 DIAGNOSIS — Z9911 Dependence on respirator [ventilator] status: Secondary | ICD-10-CM | POA: Diagnosis not present

## 2021-07-06 LAB — GLUCOSE, CAPILLARY
Glucose-Capillary: 164 mg/dL — ABNORMAL HIGH (ref 70–99)
Glucose-Capillary: 176 mg/dL — ABNORMAL HIGH (ref 70–99)
Glucose-Capillary: 184 mg/dL — ABNORMAL HIGH (ref 70–99)
Glucose-Capillary: 184 mg/dL — ABNORMAL HIGH (ref 70–99)
Glucose-Capillary: 233 mg/dL — ABNORMAL HIGH (ref 70–99)
Glucose-Capillary: 263 mg/dL — ABNORMAL HIGH (ref 70–99)

## 2021-07-06 LAB — BASIC METABOLIC PANEL
Anion gap: 7 (ref 5–15)
BUN: 23 mg/dL — ABNORMAL HIGH (ref 6–20)
CO2: 29 mmol/L (ref 22–32)
Calcium: 8.9 mg/dL (ref 8.9–10.3)
Chloride: 106 mmol/L (ref 98–111)
Creatinine, Ser: 0.53 mg/dL (ref 0.44–1.00)
GFR, Estimated: >60 mL/min (ref >60)
Glucose, Bld: 188 mg/dL — ABNORMAL HIGH (ref 70–99)
Potassium: 4.1 mmol/L (ref 3.5–5.1)
Sodium: 142 mmol/L (ref 135–145)

## 2021-07-06 LAB — CBC
HCT: 27.5 % — ABNORMAL LOW (ref 36.0–46.0)
Hemoglobin: 8.3 g/dL — ABNORMAL LOW (ref 12.0–15.0)
MCH: 31 pg (ref 26.0–34.0)
MCHC: 30.2 g/dL (ref 30.0–36.0)
MCV: 102.6 fL — ABNORMAL HIGH (ref 80.0–100.0)
Platelets: 379 10*3/uL (ref 150–400)
RBC: 2.68 MIL/uL — ABNORMAL LOW (ref 3.87–5.11)
RDW: 16.7 % — ABNORMAL HIGH (ref 11.5–15.5)
WBC: 23.1 10*3/uL — ABNORMAL HIGH (ref 4.0–10.5)
nRBC: 2.7 % — ABNORMAL HIGH (ref 0.0–0.2)

## 2021-07-06 LAB — MAGNESIUM: Magnesium: 2 mg/dL (ref 1.7–2.4)

## 2021-07-06 MED ORDER — ALBUTEROL SULFATE (2.5 MG/3ML) 0.083% IN NEBU
2.5000 mg | INHALATION_SOLUTION | RESPIRATORY_TRACT | Status: DC
Start: 2021-07-06 — End: 2021-07-07
  Administered 2021-07-06 – 2021-07-07 (×7): 2.5 mg via RESPIRATORY_TRACT
  Filled 2021-07-06 (×7): qty 3

## 2021-07-06 MED ORDER — INSULIN ASPART 100 UNIT/ML IJ SOLN
8.0000 [IU] | INTRAMUSCULAR | Status: DC
  Administered 2021-07-06 – 2021-07-07 (×5): 8 [IU] via SUBCUTANEOUS

## 2021-07-06 MED ORDER — FENTANYL CITRATE (PF) 100 MCG/2ML IJ SOLN
50.0000 ug | INTRAMUSCULAR | Status: DC | PRN
  Administered 2021-07-06 – 2021-07-08 (×4): 100 ug via INTRAVENOUS
  Filled 2021-07-06 (×4): qty 2

## 2021-07-06 MED ORDER — INSULIN ASPART 100 UNIT/ML IJ SOLN
5.0000 [IU] | INTRAMUSCULAR | Status: DC
  Administered 2021-07-06: 5 [IU] via SUBCUTANEOUS

## 2021-07-06 MED ORDER — OXYCODONE HCL 5 MG PO TABS
15.0000 mg | ORAL_TABLET | ORAL | Status: DC
  Administered 2021-07-06 – 2021-07-15 (×55): 15 mg
  Filled 2021-07-06 (×55): qty 3

## 2021-07-06 NOTE — Progress Notes (Signed)
Physical Therapy Treatment ?Patient Details ?Name: Gina Bradley ?MRN: [id number] ?DOB: Sep 06, 1960 ?Today's Date: 07/06/2021 ? ? ?History of Present Illness 61 yo female former smoker presented to [facility] ER on 3/21 with AMS, fever.  Found to have multifocal pneumonia and required intubation and pressors.  Transferred to [facility] 06/25/21 for treatment of acute on chronic hypoxic/hypercapnic respiratory failure from PNA, COPD with acute COPD, paroxysmal SVT, acute on chronic diastolic CHF, and acute metabolic encephalopathy in presence of peripheral neuropathy, back pain and chronic opiate use. PMH: CAD, smoker, heart failure, GERD, type 2 DM, hypertension, SVT, peripheral neuropathy, steroid dependent COPD, chronic pain history of DVT, chronic lymphedema, spinal stenosis, was recently diagnosed with compression deformity of the L4 vertebrae and subacute fracture of the right inferior pubic rami seen in the ED on 06/06/2021 after fall. ? ?  ?PT Comments  ? ? Pt with less sedation but remains lethargic. Only able to get pt to open eyes briefly with verbal/tactile stimuli. Not following commands despite multiple attempts using verbal and tactile stimuli. No active movement during session with passive range of motion.    ?Recommendations for follow up therapy are one component of a multi-disciplinary discharge planning process, led by the attending physician.  Recommendations may be updated based on patient status, additional functional criteria and insurance authorization. ? ?Follow Up Recommendations ? PT at Long-term acute care hospital ?  ?  ?Assistance Recommended at Discharge Frequent or constant Supervision/Assistance  ?Patient can return home with the following Assistance with cooking/housework;Assistance with feeding;Direct supervision/assist for medications management;Direct supervision/assist for financial management;Assist for transportation;Help with stairs or ramp for entrance;Two people to help with walking and/or  transfers;Two people to help with bathing/dressing/bathroom ?  ?Equipment Recommendations ? BSC/3in1;Wheelchair (measurements PT);Wheelchair cushion (measurements PT);Hospital bed;Other (comment) (hoyer lift)  ?  ?Recommendations for Other Services   ? ? ?  ?Precautions / Restrictions Precautions ?Precautions: Fall ?Precaution Comments: hx of fall with L4 compression fx and ?Restrictions ?Weight Bearing Restrictions: No  ?  ? ?Mobility ? Bed Mobility ?  ?  ?  ?  ?  ?  ?  ?  ?  ? ?Transfers ?  ?  ?  ?  ?  ?  ?  ?  ?  ?  ?  ? ?Ambulation/Gait ?  ?  ?  ?  ?  ?  ?  ?  ? ? ?Stairs ?  ?  ?  ?  ?  ? ? ?Wheelchair Mobility ?  ? ?Modified Rankin (Stroke Patients Only) ?  ? ? ?  ?Balance   ?  ?  ?  ?  ?  ?  ?  ?  ?  ?  ?  ?  ?  ?  ?  ?  ?  ?  ?  ? ?  ?Cognition Arousal/Alertness: Lethargic ?Behavior During Therapy: Flat affect ?Overall Cognitive Status: Difficult to assess ?  ?  ?  ?  ?  ?  ?  ?  ?  ?  ?  ?  ?  ?  ?  ?  ?General Comments: Pt responds by opening eyes when name called. Quickly closes eyes and did not follow any commands. ?  ?  ? ?  ?Exercises General Exercises - Upper Extremity ?Shoulder Flexion: PROM, Both, Supine, 5 reps ?Shoulder ABduction: PROM, Both, 5 reps, Supine ?Elbow Flexion: PROM, Both, 5 reps, Supine ?Elbow Extension: PROM, Both, 5 reps, Supine ?Wrist Flexion: PROM, Both, 5 reps, Supine ?Wrist Extension: PROM, Both,  5 reps, Supine ?Digit Composite Flexion: PROM, Both, 5 reps, Supine ?Composite Extension: PROM, Both, 5 reps, Supine ?General Exercises - Lower Extremity ?Ankle Circles/Pumps: PROM, Both, 5 reps, Supine ?Heel Slides: PROM, Both, 5 reps, Supine ?Hip ABduction/ADduction: PROM, Both, 5 reps, Supine ? ?  ?General Comments   ?  ?  ? ?Pertinent Vitals/Pain Pain Assessment ?Facial Expression: Relaxed, neutral ?Body Movements: Absence of movements ?Muscle Tension: Relaxed ?Compliance with ventilator (intubated pts.): Tolerating ventilator or movement ?Vocalization (extubated pts.): N/A ?CPOT  Total: 0  ? ? ?Home Living   ?  ?  ?  ?  ?  ?  ?  ?  ?  ?   ?  ?Prior Function    ?  ?  ?   ? ?PT Goals (current goals can now be found in the care plan section) Acute Rehab PT Goals ?Patient Stated Goal: none stated ?Progress towards PT goals: Not progressing toward goals - comment (lethargy) ? ?  ?Frequency ? ? ? Min 2X/week ? ? ? ?  ?PT Plan    ? ? ?Co-evaluation   ?  ?  ?  ?  ? ?  ?AM-PAC PT "6 Clicks" Mobility   ?Outcome Measure ? Help needed turning from your back to your side while in a flat bed without using bedrails?: Total ?Help needed moving from lying on your back to sitting on the side of a flat bed without using bedrails?: Total ?Help needed moving to and from a bed to a chair (including a wheelchair)?: Total ?Help needed standing up from a chair using your arms (e.g., wheelchair or bedside chair)?: Total ?Help needed to walk in hospital room?: Total ?Help needed climbing 3-5 steps with a railing? : Total ?6 Click Score: 6 ? ?  ?End of Session Equipment Utilized During Treatment: Oxygen ?Activity Tolerance: Patient limited by lethargy ?Patient left: in bed;with bed alarm set;with nursing/sitter in room ?  ?PT Visit Diagnosis: Unsteadiness on feet (R26.81);Muscle weakness (generalized) (M62.81);Other abnormalities of gait and mobility (R26.89);History of falling (Z91.81);Difficulty in walking, not elsewhere classified (R26.2) ?  ? ? ?Time: [id number] ?PT Time Calculation (min) (ACUTE ONLY): 11 min ? ?Charges:  $Therapeutic Exercise: 8-22 mins          ?          ? ?[facility] PT ?Acute Rehabilitation Services ?Pager [phone] ?Office [phone] ? ? ? ?Shary Decamp [facility] ?07/06/2021, 10:46 AM ? ?

## 2021-07-06 NOTE — Progress Notes (Addendum)
? ?NAME:  Gina Bradley, MRN:  [id number], DOB:  07/02/1960, LOS: 14 ?ADMISSION DATE:  06/22/2021, CONSULTATION DATE:  3/21 ?REFERRING MD:  Laural Benes, [place], CHIEF COMPLAINT:  acute resp failure/ fever wth AMS and SVT ? ?History of Present Illness:  ?61 yo female former smoker presented to [facility] ER on 3/21 with AMS, fever.  Found to have multifocal pneumonia and required intubation and pressors.  Transferred to [facility] for further management. ? ?Pertinent  Medical History  ?Anemia, Back pain, Spinal stenosis COPD, DM type 2, HTN, GERD, CHF, Lymphedema, Neuropathy, SVT ? ?Significant Hospital Events: ?Including procedures, antibiotic start and stop dates in addition to other pertinent events   ?3/21 Admit to [facility], intubated, start pressors, cardiology consult for SVT, start amiodarone ?3/24 [facility] ?3/28 Trach for failure to wean ?3/31 Fever, hypotension, worsening leukocytosis. Started on levophed and antibiotics. ?4/2 PICC removed for line holiday; ID following ?4/4: off precedex; remains on fentanyl; ID plan to stop cefepime and vanc ? ?Interim History / Subjective:  ?Remains on fentanyl; weaned off precedex yesterday ?Trached on mech vent ? ?Objective   ?Blood pressure 132/66, pulse (!) 119, temperature (!) 100.4 ?F (38 ?C), temperature source Oral, resp. rate (!) 24, height 5\' 5"  (1.651 m), weight 101.1 kg, last menstrual period 01/05/2015, SpO2 98 %. ?   ?Vent Mode: PRVC ?FiO2 (%):  [40 %-50 %] 50 % ?Set Rate:  [20 bmp] 20 bmp ?Vt Set:  [460 mL] 460 mL ?PEEP:  [5 cmH20] 5 cmH20 ?Plateau Pressure:  [21 cmH20-22 cmH20] 21 cmH20  ? ?Intake/Output Summary (Last 24 hours) at 07/06/2021 0830 ?Last data filed at 07/06/2021 0700 ?Gross per 24 hour  ?Intake 3922.58 ml  ?Output 2630 ml  ?Net 1292.58 ml  ? ? ?Filed Weights  ? 07/04/21 0500 07/05/21 0500 07/06/21 0403  ?Weight: 100.4 kg 100.4 kg 101.1 kg  ? ?  Physical Exam: ?General:  critically ill appearing trached on mech vent ?HEENT: MM pink/moist; trach in place ?Neuro:  Aox3; MAE; sedated; ?CV: s1s2, no m/r/g ?PULM:  ex wheezing BS bilaterally; on mech vent PSV ?GI: soft, bsx4 active  ?Extremities: warm/dry, rash improved but still present; diffuse rash more on back than chest; trace edema  ? ?Resolved Problems:  ?Septic shock, Elevated troponin from demand ischemia ?Urinary retention ? ?Assessment & Plan:  ?Acute metabolic encephalopathy; UDS at presentation positive for barbiturates ?Hx of peripheral neuropathy, back pain, chronic opiate use ?P: ?-precedex and fentanyl drip turned off ?-increasing oxy 15 mg q4 ?-cont seroquel and clonazepam ?-cont gabapentin at 50% dose for possible withdrawal ? ?Acute on chronic hypoxic & hypercapnic respiratory failure from multifocal bacterial community acquired pneumonia (POA). ?COPD with acute exacerbation ?S/p trach 3/28 for failure to wean ?P: ?-patient currently weaning on PSV ?-PSV wean trials as tolerated; consider TCT if tolerating PSV well ?-may need to rest on PRVC 6-8 cc/kg overnight ?-wean fio2 for sats >90% ?-VAP prevention in place ?-Trach care per protocol ?-scheduled albuterol nebs ?-cont brovanana, pulmicort, yupelri ?-cont prednisone 10 mg home med ? ?Fever, rash  ?Recently treated for CAP/aspiration pna. Repeat respiratory and blood culture unremarkable. Worsening leukocytosis. Briefly required vasopressors 3/31.  CXR unchanged and on minimal vent settings ?-ANA/ANCA 4/1 negative ?P: ?-ID following; appreciate recs ?-plan to stop cefepime/vanc ?-bcx2 from 4/3 pending ?-trend WBC/fever curve ? ?Paroxysmal SVT ?Acute on chronic HFpEF exacerbated by SVT ?HTN, HLD, CAD ?P: ?-telemetry monitoring ?-cont metoprolol, spironolactone ?-cont ASA and statin ? ?Anemia of critical illness, suspect she was  hemoconcentrated at admission ?P: ?-trend cbc ?-transfuse for hgb <7 ? ?DM type 2 poorly controlled with steroid induced hyperglycemia ?P: ?-increase TF coverage ?-cont SSI and basal ?-cbg monitoring ? ?Endometrial thickening on CT  scan ?P: ?-will need gyn f/u outpatient ? ?Rt subacute inferior pubic ramus fracture ?P: ?-seen in ED on 06/06/21 and scheduled for outpt orthopedic follow up ? ?Elevated d-dimer ?P: ?-LE [place] negative for DVT ? ? ?Best Practice (right click and "Reselect all SmartList Selections" daily)  ?DVT: lovenox ?SUP: protonix ?Diet: tube feeds ?Lines: PIV ?Foley: no ?Goals of care: Full code. Updated husband 4/4 over phone ? ?Labs   ? ? ?  Latest Ref Rng & Units 07/06/2021  ?  6:52 AM 07/05/2021  ?  7:56 AM 07/04/2021  ?  3:33 AM  ?CMP  ?Glucose 70 - 99 mg/dL 086   761   950    ?BUN 6 - 20 mg/dL 23   25   36    ?Creatinine 0.44 - 1.00 mg/dL 9.32   6.71   2.45    ?Sodium 135 - 145 mmol/L 142   141   141    ?Potassium 3.5 - 5.1 mmol/L 4.1   4.2   4.2    ?Chloride 98 - 111 mmol/L 106   108   106    ?CO2 22 - 32 mmol/L 29   27   29     ?Calcium 8.9 - 10.3 mg/dL 8.9   8.6   8.0    ? ? ? ?  Latest Ref Rng & Units 07/06/2021  ?  6:52 AM 07/05/2021  ?  7:56 AM 07/04/2021  ?  3:33 AM  ?CBC  ?WBC 4.0 - 10.5 K/uL 23.1   21.0   19.8    ?Hemoglobin 12.0 - 15.0 g/dL 8.3   8.2   7.8    ?Hematocrit 36.0 - 46.0 % 27.5   26.3   25.8    ?Platelets 150 - 400 K/uL 379   285   284    ? ? ?ABG ?   ?Component Value Date/Time  ? PHART 7.423 06/29/2021 0820  ? PCO2ART 57.3 (H) 06/29/2021 0820  ? PO2ART 92 06/29/2021 0820  ? HCO3 37.4 (H) 06/29/2021 0820  ? TCO2 39 (H) 06/29/2021 0820  ? ACIDBASEDEF 0.6 06/24/2021 1855  ? O2SAT 97 06/29/2021 0820  ? ? ?CBG (last 3)  ?Recent Labs  ?  07/05/21 ?2325 07/06/21 ?09/05/21 07/06/21 ?0746  ?GLUCAP 153* 164* 184*  ? ? ?Critical care: 35 minutes ? ?09/05/21, PA-C ?[facility] ?07/06/2021, 9:05 AM ? ?Please see [email or web address] for pager details. ? ?From 7A-7P if no response, please call [phone]. ?After hours, please call [phone]. ? ? ? ?

## 2021-07-06 NOTE — Progress Notes (Signed)
Nutrition Follow-up ? ?DOCUMENTATION CODES:  ? ?Obesity unspecified ? ?INTERVENTION:  ? ?Continue tube feeds via Cortrak: ?- Vital 1.5 @ 50 ml/hr (1200 ml/day) ?- ProSource TF 45 ml QID ?- Free water flushes of 200 ml q 6 hours ?  ?Tube feeding regimen provides 1960 kcal, 125 grams of protein, and 917 ml of H2O. ?  ?Total free water with flushes: 1717 ml ? ?NUTRITION DIAGNOSIS:  ? ?Inadequate oral intake related to inability to eat as evidenced by NPO status. ? ?Ongoing, being addressed via TF ? ?GOAL:  ? ?Patient will meet greater than or equal to 90% of their needs ? ?Met via TF ? ?MONITOR:  ? ?Vent status, Labs, Weight trends, TF tolerance, I & O's ? ?REASON FOR ASSESSMENT:  ? ?Ventilator ?  ? ?ASSESSMENT:  ? ?61 year old female with PMH of T2DM, CHF, COPD, GERD, chronic lymphedema who presented with AMS and required intubation. ? ?03/24 - transferred to [facility] ?03/28 - s/p tracheostomy ?03/29 - Cortrak placed (tip gastric ? ?Discussed pt with RN and during ICU rounds. Pt tolerating tube feeds at goal rate with no issues. Plan for d/c to LTACH. ? ?Current TF: Vital 1.5 @ 50 ml/hr, ProSource TF 45 ml QID, free water flushes of 200 ml q 6 hours ? ?Admit weight: 100 kg ?Current weight: 101.1 kg ? ?Pt with mild pitting generalized edema. ? ?Patient remains on ventilator support via trach ?MV: 10.5 L/min ?Temp (24hrs), Avg:100.5 ?F (38.1 ?C), Min:99.7 ?F (37.6 ?C), Max:101.4 ?F (38.6 ?C) ? ?Medications reviewed and include: colace, SSI q 4 hours, novolog 5 units q 4 hours, levemir 15 units BID, protonix, miralax, prednisone, spironolactone ? ?Labs reviewed: BUN 23, WBC 23.1, hemoglobin 8.3 ?CBG's: 145-233 x 24 hours ? ?UOP: 2420 ml x 24 hours ?I/O's: +8.9 L since admit ? ?Diet Order:   ?Diet Order   ? ?       ?  Diet NPO time specified  Diet effective now       ?  ? ?  ?  ? ?  ? ? ?EDUCATION NEEDS:  ? ?Not appropriate for education at this time ? ?Skin:  Skin Assessment: Reviewed RN Assessment ? ?Last BM:  07/05/21 large  type 7 ? ?Height:  ? ?Ht Readings from Last 1 Encounters:  ?06/30/21 _0  (1.651 m)  ? ? ?Weight:  ? ?Wt Readings from Last 1 Encounters:  ?07/06/21 101.1 kg  ? ? ?Ideal Body Weight:  56.8 kg ? ?BMI:  Body mass index is 37.09 kg/m?. ? ?Estimated Nutritional Needs:  ? ?Kcal:  1900-2100 ? ?Protein:  115-130 grams ? ?Fluid:  1.9 L/day ? ? ? ?Gustavus Bryant, MS, RD, LDN ?Inpatient Clinical Dietitian ?Please see AMiON for contact information. ? ?

## 2021-07-06 NOTE — Progress Notes (Signed)
Trach sutures removed per protocol/order at this time. ?

## 2021-07-06 NOTE — Progress Notes (Signed)
SLP Cancellation Note ? ?Patient Details ?Name: Gina Bradley ?MRN: [id number] ?DOB: 01/31/1961 ? ? ?Cancelled treatment:    Pt still on vent and not ready for PMV evaluation given MS- D/W PT after their session. Will follow along for readiness. ? ?Marybelle Giraldo L. Mischa Brittingham, MA CCC/SLP ?Acute Rehabilitation Services ?Office number [phone] ?Pager [phone] ?    ? ? ?Gina Bradley ?07/06/2021, 2:39 PM ?

## 2021-07-06 NOTE — Progress Notes (Signed)
?   ? ?Gina Bradley for Infectious Disease ? ?Date of Admission:  06/22/2021    ?       ?Reason for visit: Follow up on rash and fevers ? ?  ?Current antibiotics: ?Cefepime 3/31 -- present ?Vancomycin 3/24; 4/1-present ?  ?Previous antibiotics: ?Azithromycin 3/21 ?Ceftriaxone 3/21-3/25 ?Doxycycline 3/21-3/23 ?Zosyn 3/25-3/27 ? ?ASSESSMENT:   ? ?61 y.o. female admitted with: ? ?Maculopapular rash: Unclear etiology, however, does not appear consistent with drug rash as this has overall improved while maintaining coverage with cefepime.  Additionally this seems less likely a drug rash given short latency of onset from drug administration.  Suspect this may have been a heat rash related to her hospital gown given location of rash and overall improvement. ?Fevers, leukocytosis: Continues to be febrile with elevated white count.  No obvious source identified. ?Staph epidermidis positive blood culture: Difficult to discern contamination versus true bacteremia from PICC line.  Suspect this is likely contaminant as only 1 out of 2 cultures were positive which would suggest contamination.  Nonetheless, PICC line was removed 4/2 and repeat blood cultures are no growth. ?Multifocal pneumonia, hypoxic respiratory failure requiring tracheostomy: Repeat CT scan showed overall stability and no concerning features of empyema or effusion.  She has previously received a course of appropriate antibiotics for CAP/aspiration.  Repeat tracheal aspirate cultures are negative.  Vent settings are minimal. ?Sepsis physiology: Overall improved.  Not requiring pressors.  No infectious source identified. ? ?RECOMMENDATIONS:   ? ?Discussed with CCM.  Will discontinue antibiotics at this time given negative infectious work-up.  Suspect possible drug fever or other noninfectious cause. ?Follow-up cultures from yesterday. ?If fevers again in the next couple of days off antibiotics and remains stable, would recommend reculturing prior to starting  back antibiotics. ?For the time being, we will sign off.  Please call as needed. ? ? ?Principal Problem: ?  Acute respiratory failure with hypoxia and hypercarbia (HCC) ?Active Problems: ?  PSVT (paroxysmal supraventricular tachycardia) ([place]) ?  HTN (hypertension), benign ?  GERD (gastroesophageal reflux disease) ?  Uncontrolled type 2 diabetes mellitus with hyperglycemia, without long-term current use of insulin ([place]) ?  Severe sepsis ([place]) ?  Right subacute Inferior pubic ramus fracture ?  Endometrial thickening on CT scan ?  Chronic heart failure with preserved ejection fraction (HFpEF) ([place]) ?  Acute metabolic encephalopathy ?  Opioid dependence ([place]) ?  Multifocal pneumonia ?  Obesity, Class III, BMI 40-49.9 (morbid obesity) ([place]) ?  Transaminasemia ?  Sepsis ([place]) ? ? ? ?MEDICATIONS:   ? ?Scheduled Meds: ?? albuterol  2.5 mg Nebulization Q4H  ?? arformoterol  15 mcg Nebulization BID  ?? aspirin  81 mg Per Tube Daily  ?? atorvastatin  40 mg Per Tube Daily  ?? budesonide (PULMICORT) nebulizer solution  0.25 mg Nebulization BID  ?? chlorhexidine gluconate (MEDLINE KIT)  15 mL Mouth Rinse BID  ?? Chlorhexidine Gluconate Cloth  6 each Topical Q0600  ?? clonazePAM  1 mg Per Tube Q6H  ?? docusate  100 mg Per Tube BID  ?? enoxaparin (LOVENOX) injection  50 mg Subcutaneous Q24H  ?? feeding supplement (PROSource TF)  45 mL Per Tube QID  ?? free water  200 mL Per Tube Q6H  ?? gabapentin  400 mg Per Tube Q8H  ?? insulin aspart  0-20 Units Subcutaneous Q4H  ?? insulin aspart  5 Units Subcutaneous Q4H  ?? insulin detemir  15 Units Subcutaneous BID  ?? mouth rinse  15 mL Mouth Rinse  10 times per day  ?? metoprolol tartrate  12.5 mg Per Tube BID  ?? nystatin cream   Topical BID  ?? oxyCODONE  15 mg Per Tube Q4H  ?? pantoprazole sodium  40 mg Per Tube Daily  ?? polyethylene glycol  17 g Per Tube Daily  ?? predniSONE  10 mg Per Tube Q breakfast  ?? QUEtiapine  200 mg Per Tube BID  ?? revefenacin  175 mcg Nebulization Daily  ??  spironolactone  100 mg Per Tube BID  ? ?Continuous Infusions: ?? sodium chloride 10 mL/hr at 07/06/21 0700  ?? feeding supplement (VITAL 1.5 CAL) 1,000 mL (07/05/21 1549)  ?? fentaNYL infusion INTRAVENOUS 50 mcg/hr (07/06/21 1004)  ? ?PRN Meds:.sodium chloride, acetaminophen, albuterol, diphenhydrAMINE, fentaNYL, hydrALAZINE, midazolam, ondansetron (ZOFRAN) IV ? ?SUBJECTIVE:  ? ?24 hour events:  ?No significant events appear to be noted. ?DVT studies were negative ?Blood cultures 4/3 are no growth to date ?Tracheal aspirate cultures are negative ?No new imaging ?WBC slightly increased ?Tmax 101.3 ?Vent settings unchanged ?PICC line was discontinued ?ANA/ANCA were negative ?Her rash is stable/improved ? ?Remains sedated in the ICU ? ?Review of Systems  ?Unable to perform ROS: Intubated  ? ?  ?OBJECTIVE:  ? ?Blood pressure 132/66, pulse (!) 119, temperature (!) 100.4 ?F (38 ?C), temperature source Oral, resp. rate (!) 24, height 5' 5"  (1.651 m), weight 101.1 kg, last menstrual period 01/05/2015, SpO2 98 %. ?Body mass index is 37.09 kg/m?. ? ?Physical Exam ?Constitutional:   ?   Comments: She is a chronically ill-appearing woman, lying in bed, no acute distress, sedated with fentanyl.  ?HENT:  ?   Head: Normocephalic and atraumatic.  ?Neck:  ?   Comments: Status post tracheostomy. ?Cardiovascular:  ?   Rate and Rhythm: Regular rhythm. Tachycardia present.  ?Pulmonary:  ?   Comments: Ventilated breath sounds, diminished at the bases. ?Abdominal:  ?   General: There is no distension.  ?   Palpations: Abdomen is soft.  ?   Tenderness: There is no abdominal tenderness.  ?Musculoskeletal:  ?   Cervical back: Normal range of motion and neck supple.  ?Skin: ?   General: Skin is warm and dry.  ?   Findings: Bruising and rash present.  ?   Comments: The macular papular rash on her upper torso has overall improved. ?Her lower extremities maintain areas of petechial bruising and mottled appearance relatively unchanged.   ?Neurological:  ?   General: No focal deficit present.  ?   Mental Status: She is oriented to person, place, and time.  ?Psychiatric:     ?   Mood and Affect: Mood normal.     ?   Behavior: Behavior normal.  ? ? ? ?Lab Results: ?Lab Results  ?Component Value Date  ? WBC 23.1 (H) 07/06/2021  ? HGB 8.3 (L) 07/06/2021  ? HCT 27.5 (L) 07/06/2021  ? MCV 102.6 (H) 07/06/2021  ? PLT 379 07/06/2021  ?  ?Lab Results  ?Component Value Date  ? NA 142 07/06/2021  ? K 4.1 07/06/2021  ? CO2 29 07/06/2021  ? GLUCOSE 188 (H) 07/06/2021  ? BUN 23 (H) 07/06/2021  ? CREATININE 0.53 07/06/2021  ? CALCIUM 8.9 07/06/2021  ? GFRNONAA >60 07/06/2021  ? GFRAA >60 06/29/2019  ?  ?Lab Results  ?Component Value Date  ? ALT 47 (H) 06/27/2021  ? AST 51 (H) 06/27/2021  ? ALKPHOS 74 06/27/2021  ? BILITOT 0.3 06/27/2021  ? ? ?No results found for: CRP ? ?   ?  Component Value Date/Time  ? ESRSEDRATE 3 07/29/2009 1233  ? ?  ?I have reviewed the micro and lab results in Epic. ? ?Imaging: ?VAS [place] LOWER EXTREMITY VENOUS (DVT) ? ?Result Date: 07/05/2021 ? Lower Venous DVT Study Patient Name:  Gina Bradley  Date of Exam:   07/05/2021 Medical Rec #: [id number]        Accession #:    [id number] Date of Birth: [birth year]        Patient Gender: F Patient Age:   28 years Exam Location:  [facility] Procedure:      VAS [place] LOWER EXTREMITY VENOUS (DVT) Referring Phys: Noemi Chapel --------------------------------------------------------------------------------  Indications: Swelling.  Limitations: Body habitus. Comparison       Prior negative right lower extremity venous duplex done Study:           12/21/2015 Performing Technologist: Sharion Dove RVS  Examination Guidelines: A complete evaluation includes B-mode imaging, spectral Doppler, color Doppler, and power Doppler as needed of all accessible portions of each vessel. Bilateral testing is considered an integral part of a complete examination. Limited examinations for reoccurring indications may be  performed as noted. The reflux portion of the exam is performed with the patient in reverse Trendelenburg.  +--------+---------------+---------+-----------+----------------+-------------+ RIGHT   CompressibilityPha

## 2021-07-07 DIAGNOSIS — Z9911 Dependence on respirator [ventilator] status: Secondary | ICD-10-CM | POA: Diagnosis not present

## 2021-07-07 DIAGNOSIS — J9601 Acute respiratory failure with hypoxia: Secondary | ICD-10-CM | POA: Diagnosis not present

## 2021-07-07 DIAGNOSIS — Z930 Tracheostomy status: Secondary | ICD-10-CM | POA: Diagnosis not present

## 2021-07-07 LAB — GLUCOSE, CAPILLARY
Glucose-Capillary: 104 mg/dL — ABNORMAL HIGH (ref 70–99)
Glucose-Capillary: 105 mg/dL — ABNORMAL HIGH (ref 70–99)
Glucose-Capillary: 109 mg/dL — ABNORMAL HIGH (ref 70–99)
Glucose-Capillary: 110 mg/dL — ABNORMAL HIGH (ref 70–99)
Glucose-Capillary: 162 mg/dL — ABNORMAL HIGH (ref 70–99)
Glucose-Capillary: 175 mg/dL — ABNORMAL HIGH (ref 70–99)
Glucose-Capillary: 189 mg/dL — ABNORMAL HIGH (ref 70–99)
Glucose-Capillary: 244 mg/dL — ABNORMAL HIGH (ref 70–99)
Glucose-Capillary: 91 mg/dL (ref 70–99)

## 2021-07-07 LAB — RESPIRATORY PANEL BY PCR

## 2021-07-07 LAB — BASIC METABOLIC PANEL
Anion gap: 6 (ref 5–15)
BUN: 21 mg/dL — ABNORMAL HIGH (ref 6–20)
CO2: 30 mmol/L (ref 22–32)
Calcium: 9.1 mg/dL (ref 8.9–10.3)
Chloride: 103 mmol/L (ref 98–111)
Creatinine, Ser: 0.54 mg/dL (ref 0.44–1.00)
GFR, Estimated: >60 mL/min (ref >60)
Glucose, Bld: 174 mg/dL — ABNORMAL HIGH (ref 70–99)
Potassium: 4 mmol/L (ref 3.5–5.1)
Sodium: 139 mmol/L (ref 135–145)

## 2021-07-07 LAB — CBC
HCT: 23.4 % — ABNORMAL LOW (ref 36.0–46.0)
Hemoglobin: 7.3 g/dL — ABNORMAL LOW (ref 12.0–15.0)
MCH: 31.5 pg (ref 26.0–34.0)
MCHC: 31.2 g/dL (ref 30.0–36.0)
MCV: 100.9 fL — ABNORMAL HIGH (ref 80.0–100.0)
Platelets: 344 10*3/uL (ref 150–400)
RBC: 2.32 MIL/uL — ABNORMAL LOW (ref 3.87–5.11)
RDW: 17 % — ABNORMAL HIGH (ref 11.5–15.5)
WBC: 20.8 10*3/uL — ABNORMAL HIGH (ref 4.0–10.5)
nRBC: 2.4 % — ABNORMAL HIGH (ref 0.0–0.2)

## 2021-07-07 LAB — CULTURE, BLOOD (ROUTINE X 2)
Culture: NO GROWTH
Special Requests: ADEQUATE

## 2021-07-07 LAB — HEMOGLOBIN AND HEMATOCRIT, BLOOD
HCT: 25.2 % — ABNORMAL LOW (ref 36.0–46.0)
Hemoglobin: 7.8 g/dL — ABNORMAL LOW (ref 12.0–15.0)

## 2021-07-07 LAB — MAGNESIUM: Magnesium: 2 mg/dL (ref 1.7–2.4)

## 2021-07-07 MED ORDER — GABAPENTIN 250 MG/5ML PO SOLN
600.0000 mg | Freq: Three times a day (TID) | ORAL | Status: DC
  Administered 2021-07-07 – 2021-07-09 (×6): 600 mg
  Filled 2021-07-07 (×7): qty 12

## 2021-07-07 MED ORDER — INSULIN ASPART 100 UNIT/ML IJ SOLN
9.0000 [IU] | INTRAMUSCULAR | Status: DC
  Administered 2021-07-07 – 2021-07-08 (×4): 9 [IU] via SUBCUTANEOUS

## 2021-07-07 MED ORDER — CLONAZEPAM 1 MG PO TABS: 2.0000 mg | ORAL_TABLET | Freq: Four times a day (QID) | ORAL | Status: DC

## 2021-07-07 MED ORDER — INSULIN ASPART 100 UNIT/ML IJ SOLN
10.0000 [IU] | INTRAMUSCULAR | Status: DC
  Administered 2021-07-07: 10 [IU] via SUBCUTANEOUS

## 2021-07-07 MED ORDER — INSULIN DETEMIR 100 UNIT/ML ~~LOC~~ SOLN
22.0000 [IU] | Freq: Two times a day (BID) | SUBCUTANEOUS | Status: DC
  Administered 2021-07-07: 22 [IU] via SUBCUTANEOUS
  Filled 2021-07-07 (×3): qty 0.22

## 2021-07-07 MED ORDER — CLONAZEPAM 1 MG PO TABS
1.0000 mg | ORAL_TABLET | Freq: Four times a day (QID) | ORAL | Status: DC
  Administered 2021-07-07 – 2021-07-12 (×20): 1 mg
  Filled 2021-07-07 (×20): qty 1

## 2021-07-07 NOTE — Progress Notes (Signed)
?   07/07/21 1958  ?Vent Select  ?Invasive or Noninvasive Invasive  ?Adult Vent Y  ?Tracheostomy Shiley Flexible 8 mm Cuffed  ?Placement Date/Time: 06/29/21 1520   Placed By: (c) ICU physician  Brand: Shiley Flexible  Size (mm): 8 mm  Style: Cuffed  ?Status Secured  ?Site Assessment Clean;Dry  ?Site Care (S)  Dried;Cleansed;Dressing applied;Protective barrier to skin  ?Inner Cannula Care (S)  Cleansed/dried;Changed/new  ?Ties Assessment (S)  Clean, Dry, Secure  ?Cuff Pressure (cm H2O) Clear OR 27-39 CmH2O  ?Tracheostomy Equipment at bedside Yes and checklist posted at head of bed  ?Adult Ventilator Settings  ?Vent Type Servo U  ?Humidity HME  ?Vent Mode PRVC  ?Vt Set 460 mL  ?Set Rate 20 bmp  ?FiO2 (%) 50 %  ?I Time 0.9 Sec(s)  ?I:E Ratio Set 1:2.3  ?PEEP 5 cmH20  ?Adult Ventilator Measurements  ?Peak Airway Pressure 18 L/min  ?Mean Airway Pressure 10 cmH20  ?Plateau Pressure 23 cmH20  ?Resp Rate Spontaneous 3 br/min  ?Resp Rate Total 23 br/min  ?Exhaled Vt 514 mL  ?Measured Ve 9.6 mL  ?I:E Ratio Measured 1:2.3  ?Auto PEEP 0 cmH20  ?Total PEEP 5 cmH20  ?SpO2 97 %  ?Adult Ventilator Alarms  ?Alarms On Y  ?Ve High Alarm 18 L/min  ?Ve Low Alarm 4 L/min  ?Resp Rate High Alarm 40 br/min  ?Resp Rate Low Alarm 10  ?PEEP Low Alarm 3 cmH2O  ?Press High Alarm 45 cmH2O  ?T Apnea 20 sec(s)  ?VAP Prevention  ?HOB> 30 Degrees Y  ?Breath Sounds  ?Bilateral Breath Sounds Rhonchi  ?Vent Respiratory Assessment  ?Respiratory Pattern Regular;Unlabored  ?Airway Suctioning/Secretions  ?Suction Type Tracheal  ?Suction Device  Catheter  ?Suction Tolerance Tolerated well  ?Suctioning Adverse Effects None  ? ?Placed pt. On vent per md order at night with above settings. ?

## 2021-07-07 NOTE — Progress Notes (Signed)
? ?NAME:  Gina Bradley, MRN:  [id number], DOB:  [birth year], LOS: 12 ?ADMISSION DATE:  06/22/2021, CONSULTATION DATE:  3/21 ?REFERRING MD:  Wynetta Emery, [place], CHIEF COMPLAINT:  acute resp failure/ fever wth AMS and SVT ? ?History of Present Illness:  ?61 yo female former smoker presented to [facility] ER on 3/21 with AMS, fever.  Found to have multifocal pneumonia and required intubation and pressors.  Transferred to [facility] for further management. ? ?Pertinent  Medical History  ?Anemia, Back pain, Spinal stenosis COPD, DM type 2, HTN, GERD, CHF, Lymphedema, Neuropathy, SVT ? ?Significant Hospital Events: ?Including procedures, antibiotic start and stop dates in addition to other pertinent events   ?3/21 Admit to [facility], intubated, start pressors, cardiology consult for SVT, start amiodarone ?3/24 transfer to [facility] ?3/28 Trach for failure to wean ?3/31 Fever, hypotension, worsening leukocytosis. Started on levophed and antibiotics. ?4/2 PICC removed for line holiday; ID following ?4/4: off precedex;and fentanyl; ID plan to stop cefepime and vanc ?4/5: remains off precedex and fentanyl ? ?Interim History / Subjective:  ?Trached on mech vent; PSV 8/5 doing well ?Remains off precedex and fentanyl ? ?Objective   ?Blood pressure 120/62, pulse 100, temperature 99.4 ?F (37.4 ?C), temperature source Axillary, resp. rate (!) 21, height 5\' 5"  (1.651 m), weight 101.4 kg, last menstrual period 01/05/2015, SpO2 96 %. ?   ?Vent Mode: PRVC ?FiO2 (%):  [40 %-50 %] 40 % ?Set Rate:  [20 bmp] 20 bmp ?Vt Set:  [460 mL] 460 mL ?PEEP:  [5 cmH20] 5 cmH20 ?Pressure Support:  [10 cmH20] 10 cmH20 ?Plateau Pressure:  [18 cmH20-19 cmH20] 18 cmH20  ? ?Intake/Output Summary (Last 24 hours) at 07/07/2021 0816 ?Last data filed at 07/07/2021 0700 ?Gross per 24 hour  ?Intake 2196.23 ml  ?Output 1595 ml  ?Net 601.23 ml  ? ? ?Filed Weights  ? 07/05/21 0500 07/06/21 0403 07/07/21 0500  ?Weight: 100.4 kg 101.1 kg 101.4 kg  ? ?  Physical Exam: ?General:  critically ill  appearing trached on mech vent ?HEENT: MM pink/moist; trach in place ?Neuro: Aox3; MAE; sedated; ?CV: s1s2, no m/r/g ?PULM:  dim clear BS bilaterally; on mech vent PSV ?GI: soft, bsx4 active  ?Extremities: warm/dry, rash improved but still present; diffuse rash more on back than chest; trace edema  ? ?Resolved Problems:  ?Septic shock, Elevated troponin from demand ischemia ?Urinary retention ? ?Assessment & Plan:  ?Acute metabolic encephalopathy; UDS at presentation positive for barbiturates ?Hx of peripheral neuropathy, back pain, chronic opiate use ?P: ?-precedex and fentanyl drip turned off 4/4 ?-cont oxy 15 mg q4 ?-cont seroquel and clonazepam ?-cont gabapentin at 50% dose for possible withdrawal ? ?Acute on chronic hypoxic & hypercapnic respiratory failure from multifocal bacterial community acquired pneumonia (POA). ?COPD with acute exacerbation ?S/p trach 3/28 for failure to wean ?P: ?-patient currently weaning on PSV doing well ?-will attempt TCT today ?-may need to rest on PRVC 6-8 cc/kg overnight ?-wean fio2 for sats >90% ?-VAP prevention in place ?-Trach care per protocol ?-cont brovanana, pulmicort, yupelri ?-prn albuterol for wheezing ?-cont prednisone 10 mg home med ? ?Fever, rash  ?Recently treated for CAP/aspiration pna. Repeat respiratory and blood culture unremarkable. Worsening leukocytosis. Briefly required vasopressors 3/31.  CXR unchanged and on minimal vent settings ?-ANA/ANCA 4/1 negative ?P: ?-ID following; appreciate recs ?-plan to stop cefepime/vanc ?-bcx2 from 4/3 pending ?-trend WBC/fever curve ? ?Paroxysmal SVT ?Acute on chronic HFpEF exacerbated by SVT ?HTN, HLD, CAD ?P: ?-telemetry monitoring ?-cont metoprolol, spironolactone ?-cont ASA and statin ? ?  Anemia of critical illness, suspect she was hemoconcentrated at admission ?P: ?-trend cbc ?-transfuse for hgb <7 ? ?DM type 2 poorly controlled with steroid induced hyperglycemia ?P: ?-increase TF coverage today ?-cont SSI and  basal ?-cbg monitoring ? ?Endometrial thickening on CT scan ?P: ?-will need gyn f/u outpatient ? ?Rt subacute inferior pubic ramus fracture ?P: ?-seen in ED on 06/06/21 and scheduled for outpt orthopedic follow up ? ?Elevated d-dimer ?P: ?-LE [place] negative for DVT ? ? ?Best Practice (right click and "Reselect all SmartList Selections" daily)  ?DVT: lovenox ?SUP: protonix ?Diet: tube feeds ?Lines: PIV ?Foley: no ?Goals of care: Full code. Updated husband 4/4 over phone ? ?Labs   ? ? ?  Latest Ref Rng & Units 07/07/2021  ?  3:22 AM 07/06/2021  ?  6:52 AM 07/05/2021  ?  7:56 AM  ?CMP  ?Glucose 70 - 99 mg/dL 174   188   206    ?BUN 6 - 20 mg/dL 21   23   25     ?Creatinine 0.44 - 1.00 mg/dL 0.54   0.53   0.57    ?Sodium 135 - 145 mmol/L 139   142   141    ?Potassium 3.5 - 5.1 mmol/L 4.0   4.1   4.2    ?Chloride 98 - 111 mmol/L 103   106   108    ?CO2 22 - 32 mmol/L 30   29   27     ?Calcium 8.9 - 10.3 mg/dL 9.1   8.9   8.6    ? ? ? ?  Latest Ref Rng & Units 07/07/2021  ?  3:22 AM 07/06/2021  ?  6:52 AM 07/05/2021  ?  7:56 AM  ?CBC  ?WBC 4.0 - 10.5 K/uL 20.8   23.1   21.0    ?Hemoglobin 12.0 - 15.0 g/dL 7.3   8.3   8.2    ?Hematocrit 36.0 - 46.0 % 23.4   27.5   26.3    ?Platelets 150 - 400 K/uL 344   379   285    ? ? ?ABG ?   ?Component Value Date/Time  ? PHART 7.423 06/29/2021 0820  ? PCO2ART 57.3 (H) 06/29/2021 0820  ? PO2ART 92 06/29/2021 0820  ? HCO3 37.4 (H) 06/29/2021 0820  ? TCO2 39 (H) 06/29/2021 0820  ? ACIDBASEDEF 0.6 06/24/2021 1855  ? O2SAT 97 06/29/2021 0820  ? ? ?CBG (last 3)  ?Recent Labs  ?  07/06/21 ?2333 07/07/21 ?[id number] 07/07/21 ?0750  ?GLUCAP 184* 175* 189*  ? ? ? ?Mikki Harbor, PA-C ?[facility] ?07/07/2021, 8:16 AM ? ?Please see [email or web address] for pager details. ? ?From 7A-7P if no response, please call [phone]. ?After hours, please call Warren Lacy [phone]. ? ? ? ?

## 2021-07-07 NOTE — Progress Notes (Signed)
Patient seen today by trach team for consult.  No education is needed at this time.  All necessary equipment is at beside.   Will continue to follow for progression. Patient is still on full ventilatory support. 

## 2021-07-08 DIAGNOSIS — J9602 Acute respiratory failure with hypercapnia: Secondary | ICD-10-CM | POA: Diagnosis not present

## 2021-07-08 DIAGNOSIS — J9601 Acute respiratory failure with hypoxia: Secondary | ICD-10-CM | POA: Diagnosis not present

## 2021-07-08 DIAGNOSIS — Z9911 Dependence on respirator [ventilator] status: Secondary | ICD-10-CM | POA: Diagnosis not present

## 2021-07-08 DIAGNOSIS — G9341 Metabolic encephalopathy: Secondary | ICD-10-CM | POA: Diagnosis not present

## 2021-07-08 LAB — CBC
HCT: 26 % — ABNORMAL LOW (ref 36.0–46.0)
Hemoglobin: 8 g/dL — ABNORMAL LOW (ref 12.0–15.0)
MCH: 31.1 pg (ref 26.0–34.0)
MCHC: 30.8 g/dL (ref 30.0–36.0)
MCV: 101.2 fL — ABNORMAL HIGH (ref 80.0–100.0)
Platelets: 402 10*3/uL — ABNORMAL HIGH (ref 150–400)
RBC: 2.57 MIL/uL — ABNORMAL LOW (ref 3.87–5.11)
RDW: 17.3 % — ABNORMAL HIGH (ref 11.5–15.5)
WBC: 22.5 10*3/uL — ABNORMAL HIGH (ref 4.0–10.5)
nRBC: 2.4 % — ABNORMAL HIGH (ref 0.0–0.2)

## 2021-07-08 LAB — BASIC METABOLIC PANEL
Anion gap: 9 (ref 5–15)
Anion gap: 8 (ref 5–15)
BUN: 22 mg/dL — ABNORMAL HIGH (ref 6–20)
BUN: 17 mg/dL (ref 6–20)
CO2: 28 mmol/L (ref 22–32)
CO2: 32 mmol/L (ref 22–32)
Calcium: 9.1 mg/dL (ref 8.9–10.3)
Calcium: 9.5 mg/dL (ref 8.9–10.3)
Chloride: 101 mmol/L (ref 98–111)
Chloride: 100 mmol/L (ref 98–111)
Creatinine, Ser: 0.5 mg/dL (ref 0.44–1.00)
Creatinine, Ser: 0.52 mg/dL (ref 0.44–1.00)
GFR, Estimated: >60 mL/min (ref >60)
GFR, Estimated: >60 mL/min (ref >60)
Glucose, Bld: 97 mg/dL (ref 70–99)
Glucose, Bld: 154 mg/dL — ABNORMAL HIGH (ref 70–99)
Potassium: 5.4 mmol/L — ABNORMAL HIGH (ref 3.5–5.1)
Potassium: 3.9 mmol/L (ref 3.5–5.1)
Sodium: 138 mmol/L (ref 135–145)
Sodium: 140 mmol/L (ref 135–145)

## 2021-07-08 LAB — GLUCOSE, CAPILLARY
Glucose-Capillary: 100 mg/dL — ABNORMAL HIGH (ref 70–99)
Glucose-Capillary: 113 mg/dL — ABNORMAL HIGH (ref 70–99)
Glucose-Capillary: 164 mg/dL — ABNORMAL HIGH (ref 70–99)
Glucose-Capillary: 210 mg/dL — ABNORMAL HIGH (ref 70–99)
Glucose-Capillary: 236 mg/dL — ABNORMAL HIGH (ref 70–99)
Glucose-Capillary: 251 mg/dL — ABNORMAL HIGH (ref 70–99)
Glucose-Capillary: 91 mg/dL (ref 70–99)

## 2021-07-08 MED ORDER — SPIRONOLACTONE 25 MG PO TABS
50.0000 mg | ORAL_TABLET | Freq: Two times a day (BID) | ORAL | Status: DC
  Administered 2021-07-08 – 2021-07-24 (×31): 50 mg
  Filled 2021-07-08 (×31): qty 2

## 2021-07-08 MED ORDER — INSULIN ASPART 100 UNIT/ML IJ SOLN
7.0000 [IU] | INTRAMUSCULAR | Status: DC
  Administered 2021-07-08 – 2021-07-13 (×28): 7 [IU] via SUBCUTANEOUS

## 2021-07-08 MED ORDER — SODIUM ZIRCONIUM CYCLOSILICATE 10 G PO PACK
10.0000 g | PACK | Freq: Once | ORAL | Status: AC
Start: 2021-07-08 — End: 2021-07-08
  Administered 2021-07-08: 10 g
  Filled 2021-07-08: qty 1

## 2021-07-08 MED ORDER — FREE WATER
100.0000 mL | Freq: Four times a day (QID) | Status: DC
  Administered 2021-07-08 – 2021-07-20 (×47): 100 mL

## 2021-07-08 MED ORDER — INSULIN DETEMIR 100 UNIT/ML ~~LOC~~ SOLN
20.0000 [IU] | Freq: Two times a day (BID) | SUBCUTANEOUS | Status: DC
Start: 2021-07-08 — End: 2021-07-13
  Administered 2021-07-08 – 2021-07-12 (×10): 20 [IU] via SUBCUTANEOUS
  Filled 2021-07-08 (×12): qty 0.2

## 2021-07-08 MED ORDER — PRIMIDONE 50 MG PO TABS
25.0000 mg | ORAL_TABLET | Freq: Every day | ORAL | Status: DC
  Administered 2021-07-08: 25 mg
  Filled 2021-07-08: qty 0.5

## 2021-07-08 NOTE — Progress Notes (Signed)
eLink Physician-Brief Progress Note ?Patient Name: Gina Bradley ?DOB: 1960-08-12 ?MRN: [id number] ? ? ?Date of Service ? 07/08/2021  ?HPI/Events of Note ? Hyperkalemia - K+ = 5.4.  ?eICU Interventions ? Plan: ?Lokelma 10 gm per tube now. ?Repeat BMP at 10 AM.  ? ? ? ?Intervention Category ?Major Interventions: Electrolyte abnormality - evaluation and management ? ?Ardys Hataway Dennard Nip ?07/08/2021, 4:07 AM ?

## 2021-07-08 NOTE — Progress Notes (Signed)
Pt placed back on full vent support due to agitation, tachypnea  and tachycardia. Pt tolerating well at this time, RN at bedside, RT will continue to monitor.  ? ?

## 2021-07-08 NOTE — Progress Notes (Signed)
SLP Cancellation Note ? ?Patient Details ?Name: Gina Bradley ?MRN: [id number] ?DOB: 21-Jun-1960 ? ? ?Cancelled treatment:       Reason Eval/Treat Not Completed: Patient not medically ready (Pt currently on the vent and pt's RN, Vaughan Basta, reported that weaning was unsuccessfully attempted this morning. SLP will follow up on subsequent date.) ? ?Serayah Yazdani I. Hardin Negus, [place], CCC-SLP ?[facility] Services ?Office number [phone] ?Pager [phone] ? ?[name] ?07/08/2021, 9:55 AM ?

## 2021-07-08 NOTE — Progress Notes (Signed)
? ?NAME:  Gina Bradley, MRN:  [id number], DOB:  July 15, 1960, LOS: 18 ?ADMISSION DATE:  06/22/2021, CONSULTATION DATE:  3/21 ?REFERRING MD:  Wynetta Emery, [place], CHIEF COMPLAINT:  acute resp failure/ fever wth AMS and SVT ? ?History of Present Illness:  ?61 yo female former smoker presented to [facility] ER on 3/21 with AMS, fever.  Found to have multifocal pneumonia and required intubation and pressors.  Transferred to [facility] for further management. ? ?Pertinent  Medical History  ?Anemia, Back pain, Spinal stenosis COPD, DM type 2, HTN, GERD, CHF, Lymphedema, Neuropathy, SVT ? ?Significant Hospital Events: ?Including procedures, antibiotic start and stop dates in addition to other pertinent events   ?3/21 Admit to [facility], intubated, start pressors, cardiology consult for SVT, start amiodarone ?3/24 transfer to [facility] ?3/28 Trach for failure to wean ?3/31 Fever, hypotension, worsening leukocytosis. Started on levophed and antibiotics. ?4/2 PICC removed for line holiday; ID following ?4/4: off precedex;and fentanyl; ID plan to stop cefepime and vanc ?4/5: remains off precedex and fentanyl; failed PSV early in morning ?4/6: remains off continuous sedation; failed PSV this morning due to desaturation ? ?Interim History / Subjective:  ?K 5.4 this am; given lokelma ?Viral panel positive for metapneumovirus ? ?Fever trending down from 102 F to 100 F this am ?Trached on mech vent ?On PSV 8/5 this monring and desated; placed on PRVC and increased fio2 ? ?Objective   ?Blood pressure 124/69, pulse (!) 113, temperature 100 ?F (37.8 ?C), temperature source Axillary, resp. rate 19, height 5\' 5"  (1.651 m), weight 100.3 kg, last menstrual period 01/05/2015, SpO2 99 %. ?   ?Vent Mode: PRVC ?FiO2 (%):  [40 %-60 %] 50 % ?Set Rate:  [20 bmp] 20 bmp ?Vt Set:  [460 mL] 460 mL ?PEEP:  [5 cmH20] 5 cmH20 ?Pressure Support:  [10 cmH20] 10 cmH20 ?Plateau Pressure:  [12 cmH20-23 cmH20] 12 cmH20  ? ?Intake/Output Summary (Last 24 hours) at 07/08/2021 0717 ?Last data  filed at 07/08/2021 0606 ?Gross per 24 hour  ?Intake 2107.88 ml  ?Output 1700 ml  ?Net 407.88 ml  ? ? ?Filed Weights  ? 07/06/21 0403 07/07/21 0500 07/08/21 0307  ?Weight: 101.1 kg 101.4 kg 100.3 kg  ? ?  Physical Exam: ?General:  ill appearing trached on mech vent ?HEENT: MM pink/moist; trach in place ?Neuro: Follows intermittent commands (sticks tongue out to command); PERRL; cough/gag reflex in tact ?CV: s1s2, no m/r/g ?PULM:  dim clear BS bilaterally; on mech vent PRVC ?GI: soft, bsx4 active  ? ? ?Resolved Problems:  ?Septic shock, Elevated troponin from demand ischemia ?Urinary retention ? ?Assessment & Plan:  ?Acute metabolic encephalopathy; UDS at presentation positive for barbiturates ?Hx of peripheral neuropathy, back pain, chronic opiate use ?P: ?-precedex and fentanyl drip turned off 4/4 ?-cont oxy, seroquel, gabapentin, and clonazepam ? ?Acute on chronic hypoxic & hypercapnic respiratory failure from multifocal bacterial community acquired pneumonia (POA). ?COPD with acute exacerbation ?S/p trach 3/28 for failure to wean ?P: ?-patient failed PSV wean trial this am due to desaturation ?-Continue PRVC 6-8 cc/kg ?-consider TCT vs PSV trial later today and rest on PRVC overnight ?-wean fio2 for sats >90% ?-VAP prevention in place ?-Trach care per protocol ?-cont brovanana, pulmicort, yupelri ?-prn albuterol for wheezing ?-cont prednisone 10 mg home med ? ?Fever, rash  ?Metapneumovirus ?Recently treated for CAP/aspiration pna. Repeat respiratory and blood culture unremarkable. Worsening leukocytosis. Briefly required vasopressors 3/31.  CXR unchanged and on minimal vent settings ?-ANA/ANCA 4/1 negative ?P: ?-ID following; appreciate recs ?-follow  bcx2 from 4/3 ?-trend WBC/fever curve ? ?Paroxysmal SVT ?Acute on chronic HFpEF exacerbated by SVT ?HTN, HLD, CAD ?P: ?-telemetry monitoring ?-cont metoprolol, spironolactone ?-cont ASA and statin ? ?Anemia of critical illness: suspect she was hemoconcentrated at  admission ?P: ?-trend cbc ?-transfuse for hgb <7 ? ?DM type 2 poorly controlled with steroid induced hyperglycemia ?P: ?-cont TF coverage, SSI, and basal ?-cbg monitoring ? ?Endometrial thickening on CT scan ?P: ?-will need gyn f/u outpatient ? ?Rt subacute inferior pubic ramus fracture ?P: ?-seen in ED on 06/06/21 and scheduled for outpt orthopedic follow up ? ?Elevated d-dimer ?P: ?-LE [place] negative for DVT ? ? ?Best Practice (right click and "Reselect all SmartList Selections" daily)  ? ?Best Practice (right click and "Reselect all SmartList Selections" daily)  ? ?Diet/type: tubefeeds ?DVT prophylaxis: LMWH ?GI prophylaxis: PPI ?Lines: N/A ?Foley:  Yes, and it is still needed ?Code Status:  full code ?Last date of multidisciplinary goals of care discussion [4/4 spoke with husband over phone and updated] ? ? ?Mikki Harbor, PA-C ?[facility] ?07/08/2021, 7:17 AM ? ?Please see [email or web address] for pager details. ? ?From 7A-7P if no response, please call [phone]. ?After hours, please call Warren Lacy [phone]. ? ? ? ?

## 2021-07-09 DIAGNOSIS — J9601 Acute respiratory failure with hypoxia: Secondary | ICD-10-CM | POA: Diagnosis not present

## 2021-07-09 DIAGNOSIS — J9602 Acute respiratory failure with hypercapnia: Secondary | ICD-10-CM | POA: Diagnosis not present

## 2021-07-09 LAB — CBC
HCT: 25.5 % — ABNORMAL LOW (ref 36.0–46.0)
Hemoglobin: 7.7 g/dL — ABNORMAL LOW (ref 12.0–15.0)
MCH: 31 pg (ref 26.0–34.0)
MCHC: 30.2 g/dL (ref 30.0–36.0)
MCV: 102.8 fL — ABNORMAL HIGH (ref 80.0–100.0)
Platelets: 384 10*3/uL (ref 150–400)
RBC: 2.48 MIL/uL — ABNORMAL LOW (ref 3.87–5.11)
RDW: 17.5 % — ABNORMAL HIGH (ref 11.5–15.5)
WBC: 19.8 10*3/uL — ABNORMAL HIGH (ref 4.0–10.5)
nRBC: 3.4 % — ABNORMAL HIGH (ref 0.0–0.2)

## 2021-07-09 LAB — GLUCOSE, CAPILLARY
Glucose-Capillary: 105 mg/dL — ABNORMAL HIGH (ref 70–99)
Glucose-Capillary: 119 mg/dL — ABNORMAL HIGH (ref 70–99)
Glucose-Capillary: 177 mg/dL — ABNORMAL HIGH (ref 70–99)
Glucose-Capillary: 250 mg/dL — ABNORMAL HIGH (ref 70–99)
Glucose-Capillary: 130 mg/dL — ABNORMAL HIGH (ref 70–99)
Glucose-Capillary: 115 mg/dL — ABNORMAL HIGH (ref 70–99)

## 2021-07-09 LAB — BASIC METABOLIC PANEL
Anion gap: 9 (ref 5–15)
BUN: 20 mg/dL (ref 6–20)
CO2: 33 mmol/L — ABNORMAL HIGH (ref 22–32)
Calcium: 9 mg/dL (ref 8.9–10.3)
Chloride: 100 mmol/L (ref 98–111)
Creatinine, Ser: 0.55 mg/dL (ref 0.44–1.00)
GFR, Estimated: >60 mL/min (ref >60)
Glucose, Bld: 95 mg/dL (ref 70–99)
Potassium: 4.3 mmol/L (ref 3.5–5.1)
Sodium: 142 mmol/L (ref 135–145)

## 2021-07-09 LAB — PATHOLOGIST SMEAR REVIEW

## 2021-07-09 MED ORDER — PRIMIDONE 50 MG PO TABS
50.0000 mg | ORAL_TABLET | Freq: Every day | ORAL | Status: DC
  Administered 2021-07-09 – 2021-07-24 (×16): 50 mg
  Filled 2021-07-09 (×17): qty 1

## 2021-07-09 MED ORDER — NUTRISOURCE FIBER PO PACK
1.0000 | PACK | Freq: Two times a day (BID) | ORAL | Status: DC
  Administered 2021-07-09 – 2021-07-11 (×3): 1
  Filled 2021-07-09 (×3): qty 1

## 2021-07-09 MED ORDER — GABAPENTIN 250 MG/5ML PO SOLN
600.0000 mg | Freq: Four times a day (QID) | ORAL | Status: DC
  Administered 2021-07-09 – 2021-07-15 (×25): 600 mg
  Filled 2021-07-09 (×26): qty 12

## 2021-07-09 NOTE — Progress Notes (Signed)
Pt placed back on full vent due to agitation, tachycardia, and increased WOB. Pt tolerating well, RN aware, RT will continue to monitor.  ?

## 2021-07-09 NOTE — Progress Notes (Signed)
Pt placed on Trach collar by RT. Pt tolerating well at this time, RN aware, MD aware,RT will continue to monitor. ? ? ? 07/09/21 1257  ?Respiratory Assessment  ?Assessment Type Assess only  ?Respiratory Pattern Regular;Unlabored  ?Chest Assessment Chest expansion symmetrical  ?Cough Productive  ?Sputum Amount Small  ?Sputum Color Tan  ?Sputum Consistency Thick  ?Sputum Specimen Source Tracheostomy tube  ?Bilateral Breath Sounds Diminished;Rhonchi  ?Oxygen Therapy/Pulse Ox  ?O2 Device (S)  Tracheostomy Collar  ?O2 Therapy Oxygen humidified  ?O2 Flow Rate (L/min) 10 L/min  ?FiO2 (%) 60 %  ?SpO2 98 %  ?Tracheostomy Shiley Flexible 8 mm Cuffed  ?Placement Date/Time: 06/29/21 1520   Placed By: (c) ICU physician  Brand: Shiley Flexible  Size (mm): 8 mm  Style: Cuffed  ?Status Secured  ?Site Assessment Clean;Dry  ?Ties Assessment Clean, Dry, Secure  ?Tracheostomy Equipment at bedside Yes and checklist posted at head of bed  ? ? ?

## 2021-07-09 NOTE — Progress Notes (Signed)
Pt placed on PSV by RT, pt tolerating well at this time. RN aware, RT will continue to monitor.  ?

## 2021-07-09 NOTE — Progress Notes (Signed)
Physical Therapy Treatment ?Patient Details ?Name: Gina Bradley ?MRN: [id number] ?DOB: 19-Mar-1961 ?Today's Date: 07/09/2021 ? ? ?History of Present Illness 61 yo female former smoker presented to [facility] ER on 3/21 with AMS, fever.  Found to have multifocal pneumonia and required intubation and pressors.  Transferred to [facility] 06/25/21 for treatment of acute on chronic hypoxic/hypercapnic respiratory failure from PNA, COPD with acute COPD, paroxysmal SVT, acute on chronic diastolic CHF, and acute metabolic encephalopathy in presence of peripheral neuropathy, back pain and chronic opiate use. PMH: CAD, smoker, heart failure, GERD, type 2 DM, hypertension, SVT, peripheral neuropathy, steroid dependent COPD, chronic pain history of DVT, chronic lymphedema, spinal stenosis, was recently diagnosed with compression deformity of the L4 vertebrae and subacute fracture of the right inferior pubic rami seen in the ED on 06/06/2021 after fall. ? ?  ?PT Comments  ? ? Pt more alert and did follow some 1 step commands and assisted with exercises. Remains with significant cognitive and mobility impairments.    ?Recommendations for follow up therapy are one component of a multi-disciplinary discharge planning process, led by the attending physician.  Recommendations may be updated based on patient status, additional functional criteria and insurance authorization. ? ?Follow Up Recommendations ? PT at Long-term acute care hospital ?  ?  ?Assistance Recommended at Discharge Frequent or constant Supervision/Assistance  ?Patient can return home with the following Assistance with cooking/housework;Assistance with feeding;Direct supervision/assist for medications management;Direct supervision/assist for financial management;Assist for transportation;Help with stairs or ramp for entrance;Two people to help with walking and/or transfers;Two people to help with bathing/dressing/bathroom ?  ?Equipment Recommendations ? BSC/3in1;Wheelchair (measurements  PT);Wheelchair cushion (measurements PT);Hospital bed;Other (comment) (hoyer lift)  ?  ?Recommendations for Other Services   ? ? ?  ?Precautions / Restrictions Precautions ?Precautions: Fall ?Precaution Comments: hx of fall with L4 compression fx and ?Restrictions ?Weight Bearing Restrictions: No  ?  ? ?Mobility ? Bed Mobility ?  ?  ?  ?  ?  ?  ?  ?  ?  ? ?Transfers ?  ?  ?  ?  ?  ?  ?  ?  ?  ?  ?  ? ?Ambulation/Gait ?  ?  ?  ?  ?  ?  ?  ?  ? ? ?Stairs ?  ?  ?  ?  ?  ? ? ?Wheelchair Mobility ?  ? ?Modified Rankin (Stroke Patients Only) ?  ? ? ?  ?Balance   ?  ?  ?  ?  ?  ?  ?  ?  ?  ?  ?  ?  ?  ?  ?  ?  ?  ?  ?  ? ?  ?Cognition Arousal/Alertness: Awake/alert ?Behavior During Therapy: Restless ?Overall Cognitive Status: Difficult to assess ?  ?  ?  ?  ?  ?  ?  ?  ?  ?  ?  ?  ?  ?  ?  ?  ?General Comments: Pt following some 1 step commands with incr time but not consistent ?  ?  ? ?  ?Exercises General Exercises - Upper Extremity ?Shoulder Flexion: Both, Supine, AAROM, 10 reps ?Shoulder Horizontal ABduction: AAROM, Both, 10 reps, Supine ?Shoulder Horizontal ADduction: AAROM, Both, 10 reps, Supine ?Elbow Flexion: Both, Supine, AAROM, 10 reps ?Elbow Extension: Both, Supine, AAROM, 10 reps ?General Exercises - Lower Extremity ?Ankle Circles/Pumps: PROM, Both, 5 reps, Supine ?Heel Slides: Both, Supine, AAROM, 10 reps ?Hip ABduction/ADduction: Both, Supine, AAROM, 10 reps ?Straight  Leg Raises: AAROM, Both, 10 reps, Supine ? ?  ?General Comments   ?  ?  ? ?Pertinent Vitals/Pain Pain Assessment ?Pain Assessment: CPOT ?Facial Expression: Relaxed, neutral ?Body Movements: Restlessness ?Muscle Tension: Relaxed ?Compliance with ventilator (intubated pts.): Tolerating ventilator or movement ?Vocalization (extubated pts.): N/A ?CPOT Total: 2  ? ? ?Home Living   ?  ?  ?  ?  ?  ?  ?  ?  ?  ?   ?  ?Prior Function    ?  ?  ?   ? ?PT Goals (current goals can now be found in the care plan section) Acute Rehab PT Goals ?Patient Stated  Goal: none stated ?Progress towards PT goals: Not progressing toward goals - comment ? ?  ?Frequency ? ? ? Min 2X/week ? ? ? ?  ?PT Plan Current plan remains appropriate  ? ? ?Co-evaluation   ?  ?  ?  ?  ? ?  ?AM-PAC PT "6 Clicks" Mobility   ?Outcome Measure ? Help needed turning from your back to your side while in a flat bed without using bedrails?: Total ?Help needed moving from lying on your back to sitting on the side of a flat bed without using bedrails?: Total ?Help needed moving to and from a bed to a chair (including a wheelchair)?: Total ?Help needed standing up from a chair using your arms (e.g., wheelchair or bedside chair)?: Total ?Help needed to walk in hospital room?: Total ?Help needed climbing 3-5 steps with a railing? : Total ?6 Click Score: 6 ? ?  ?End of Session Equipment Utilized During Treatment: Oxygen ?Activity Tolerance: Other (comment) (Limited by cognition) ?Patient left: in bed;with bed alarm set ?  ?PT Visit Diagnosis: Unsteadiness on feet (R26.81);Muscle weakness (generalized) (M62.81);Other abnormalities of gait and mobility (R26.89);History of falling (Z91.81);Difficulty in walking, not elsewhere classified (R26.2) ?  ? ? ?Time: [id number] ?PT Time Calculation (min) (ACUTE ONLY): 13 min ? ?Charges:  $Therapeutic Exercise: 8-22 mins          ?          ? ?[facility] PT ?Acute Rehabilitation Services ?Pager [phone] ?Office [phone] ? ? ? ?Shary Decamp [facility] ?07/09/2021, 12:13 PM ? ?

## 2021-07-09 NOTE — Progress Notes (Addendum)
? ?NAME:  Gina Bradley, MRN:  [id number], DOB:  [birth year], LOS: 5 ?ADMISSION DATE:  06/22/2021, CONSULTATION DATE:  3/21 ?REFERRING MD:  Wynetta Emery, [facility], CHIEF COMPLAINT:  acute resp failure/ fever wth AMS and SVT ? ?History of Present Illness:  ?61 yo female former smoker presented to [facility] ER on 3/21 with AMS, fever.  Found to have multifocal pneumonia and required intubation and pressors.  Transferred to [facility] for further management. ? ?Pertinent  Medical History  ?Anemia, Back pain, Spinal stenosis COPD, DM type 2, HTN, GERD, CHF, Lymphedema, Neuropathy, SVT ? ?Significant Hospital Events: ?Including procedures, antibiotic start and stop dates in addition to other pertinent events   ?3/21 Admit to [facility], intubated, start pressors, cardiology consult for SVT, start amiodarone ?3/24 transfer to [facility] ?3/28 Trach for failure to wean ?3/31 Fever, hypotension, worsening leukocytosis. Started on levophed and antibiotics. ?4/2 PICC removed for line holiday; ID following ?4/4: off precedex;and fentanyl; ID plan to stop cefepime and vanc ?4/5: remains off precedex and fentanyl; failed PSV early in morning ?4/6: remains off continuous sedation; failed PSV this morning due to desaturation ? ?Interim History / Subjective:  ?Placed on PS this AM 10/5, overnight with some agitation requiring 1 dose of PRN versed  ? ?Objective   ?Blood pressure (!) 123/107, pulse (!) 122, temperature 99.8 ?F (37.7 ?C), temperature source Oral, resp. rate (!) 25, height 5\' 5"  (1.651 m), weight 100.3 kg, last menstrual period 01/05/2015, SpO2 97 %. ?   ?Vent Mode: PSV;CPAP ?FiO2 (%):  [50 %] 50 % ?Set Rate:  [20 bmp] 20 bmp ?Vt Set:  [460 mL] 460 mL ?PEEP:  [5 cmH20] 5 cmH20 ?Pressure Support:  [10 cmH20] 10 cmH20 ?Plateau Pressure:  [19 cmH20-23 cmH20] 19 cmH20  ? ?Intake/Output Summary (Last 24 hours) at 07/09/2021 1052 ?Last data filed at 07/09/2021 0007 ?Gross per 24 hour  ?Intake 1030 ml  ?Output 1025 ml  ?Net 5 ml  ? ?Filed Weights  ? 07/06/21 0403  07/07/21 0500 07/08/21 0307  ?Weight: 101.1 kg 101.4 kg 100.3 kg  ? ? Physical Exam: ?General:  Chronically ill appearing female, in bed  ?HEENT: Trach in place, MMM ?Neuro: opens eyes with physical stimulation, follows simple commands in lowers, spontaneously moves uppers, pupils intact and reactive  ?CV: s1s2, no m/r/g ?PULM:  diminished to bases, vent assisted breaths  ?GI: soft, active bowel sounds, obese  ? ? ?Resolved Problems:  ?Septic shock, Elevated troponin from demand ischemia ?Urinary retention ? ?Assessment & Plan:  ? ?Acute metabolic encephalopathy; UDS at presentation positive for barbiturates ?Hx of peripheral neuropathy, back pain, chronic opiate use ?P: ?-cont oxy, seroquel, gabapentin (increase to home dose of 600-4 times day), and clonazepam ?-Restarted home primidone 4/6 (increase back to home dose of 50mg )  ? ?Acute on chronic hypoxic & hypercapnic respiratory failure from multifocal bacterial community acquired pneumonia (POA). ?COPD with acute exacerbation ?S/p trach 3/28 for failure to wean ?P: ?-patient failed PSV wean trial this am due to desaturation ?-Continue PRVC 6-8 cc/kg ?-consider TCT vs PSV trial later today and rest on PRVC overnight ?-wean fio2 for sats >90% ?-VAP prevention in place ?-Trach care per protocol ?-cont brovanana, pulmicort, yupelri ?-prn albuterol for wheezing ?-cont prednisone 10 mg home med ? ?Metapneumovirus ?Fever, rash  ?Recently treated for CAP/aspiration pna. Repeat respiratory and blood culture unremarkable. Worsening leukocytosis. Briefly required vasopressors 3/31.  CXR unchanged and on minimal vent settings ?-ANA/ANCA 4/1 negative ?-ID signed off 4/4  ?P: ?-Cultures remain negative  ?-  trend WBC/fever curve ? ?Paroxysmal SVT ?Acute on chronic HFpEF exacerbated by SVT ?HTN, HLD, CAD ?P: ?-telemetry monitoring ?-cont metoprolol, spironolactone ?-cont ASA and statin ? ?Anemia of critical illness: suspect she was hemoconcentrated at admission ?P: ?-trend  cbc ?-transfuse for hgb <7 ? ?DM type 2 poorly controlled with steroid induced hyperglycemia ?P: ?-cont TF coverage, SSI, and basal ?-cbg monitoring ? ?Endometrial thickening on CT scan ?P: ?-will need gyn f/u outpatient ? ?Rt subacute inferior pubic ramus fracture ?P: ?-seen in ED on 06/06/21 and scheduled for outpt orthopedic follow up ? ?Elevated d-dimer ?P: ?-LE [place] negative for DVT ? ?Best Practice (right click and "Reselect all SmartList Selections" daily)  ? ?Best Practice (right click and "Reselect all SmartList Selections" daily)  ? ?Diet/type: tubefeeds ?DVT prophylaxis: LMWH ?GI prophylaxis: PPI ?Lines: N/A ?Foley:  N/A ?Code Status:  full code ?Last date of multidisciplinary goals of care discussion. Ongoing. Possible Dispo to LTAC  ? ?Time Spent: 36 minutes  ? ?Hayden Pedro, AGACNP-BC ?[facility]  ?PCCM Pgr: [phone] ? ? ?

## 2021-07-09 NOTE — Progress Notes (Signed)
Notified by [facility] that patient's insurance is requesting Peer to Peer review for authorization for [facility].  Provider has been notified of request for P2P review. ? ?Phone number is [phone], option 5 to complete the peer to peer.  ?Deadline is 1pm. ? ? ?Quintella Baton, RN, BSN  ?Trauma/Neuro ICU Case Manager ?[phone]  ?

## 2021-07-10 DIAGNOSIS — J9601 Acute respiratory failure with hypoxia: Secondary | ICD-10-CM | POA: Diagnosis not present

## 2021-07-10 DIAGNOSIS — J9602 Acute respiratory failure with hypercapnia: Secondary | ICD-10-CM | POA: Diagnosis not present

## 2021-07-10 LAB — CBC
HCT: 27 % — ABNORMAL LOW (ref 36.0–46.0)
Hemoglobin: 8.1 g/dL — ABNORMAL LOW (ref 12.0–15.0)
MCH: 31 pg (ref 26.0–34.0)
MCHC: 30 g/dL (ref 30.0–36.0)
MCV: 103.4 fL — ABNORMAL HIGH (ref 80.0–100.0)
Platelets: 410 10*3/uL — ABNORMAL HIGH (ref 150–400)
RBC: 2.61 MIL/uL — ABNORMAL LOW (ref 3.87–5.11)
RDW: 18.6 % — ABNORMAL HIGH (ref 11.5–15.5)
WBC: 18.4 10*3/uL — ABNORMAL HIGH (ref 4.0–10.5)
nRBC: 3.3 % — ABNORMAL HIGH (ref 0.0–0.2)

## 2021-07-10 LAB — BASIC METABOLIC PANEL
Anion gap: 7 (ref 5–15)
BUN: 21 mg/dL — ABNORMAL HIGH (ref 6–20)
CO2: 34 mmol/L — ABNORMAL HIGH (ref 22–32)
Calcium: 9.1 mg/dL (ref 8.9–10.3)
Chloride: 101 mmol/L (ref 98–111)
Creatinine, Ser: 0.45 mg/dL (ref 0.44–1.00)
GFR, Estimated: >60 mL/min (ref >60)
Glucose, Bld: 131 mg/dL — ABNORMAL HIGH (ref 70–99)
Potassium: 4.4 mmol/L (ref 3.5–5.1)
Sodium: 142 mmol/L (ref 135–145)

## 2021-07-10 LAB — MAGNESIUM: Magnesium: 1.8 mg/dL (ref 1.7–2.4)

## 2021-07-10 LAB — GLUCOSE, CAPILLARY
Glucose-Capillary: 102 mg/dL — ABNORMAL HIGH (ref 70–99)
Glucose-Capillary: 107 mg/dL — ABNORMAL HIGH (ref 70–99)
Glucose-Capillary: 128 mg/dL — ABNORMAL HIGH (ref 70–99)
Glucose-Capillary: 205 mg/dL — ABNORMAL HIGH (ref 70–99)
Glucose-Capillary: 269 mg/dL — ABNORMAL HIGH (ref 70–99)
Glucose-Capillary: 93 mg/dL (ref 70–99)

## 2021-07-10 LAB — CULTURE, BLOOD (ROUTINE X 2)
Culture: NO GROWTH
Culture: NO GROWTH
Special Requests: ADEQUATE
Special Requests: ADEQUATE

## 2021-07-10 LAB — PHOSPHORUS: Phosphorus: 3.8 mg/dL (ref 2.5–4.6)

## 2021-07-10 NOTE — Progress Notes (Signed)
Pt placed on trach collar 10L 60% by RT. Pt tolerating well at this time, RN aware, RT will continue to monitor.  ? ? ? 07/10/21 1136  ?Respiratory Assessment  ?Assessment Type Assess only  ?Respiratory Pattern Regular;Unlabored  ?Chest Assessment Chest expansion symmetrical  ?Cough Productive  ?Sputum Amount Moderate  ?Sputum Color White;Yellow  ?Sputum Consistency Thick  ?Sputum Specimen Source Tracheostomy tube  ?Bilateral Breath Sounds Rhonchi;Diminished  ?Oxygen Therapy/Pulse Ox  ?O2 Device (S)  Tracheostomy Collar  ?O2 Therapy Oxygen humidified  ?O2 Flow Rate (L/min) 10 L/min  ?FiO2 (%) 60 %  ?SpO2 99 %  ?Tracheostomy Shiley Flexible 8 mm Cuffed  ?Placement Date/Time: 06/29/21 1520   Placed By: (c) ICU physician  Brand: Shiley Flexible  Size (mm): 8 mm  Style: Cuffed  ?Status Secured  ?Site Assessment Clean;Dry  ?Site Care Cleansed;Dried;Dressing applied  ?Inner Cannula Care Changed/new  ?Ties Assessment Clean, Dry, Secure  ?Tracheostomy Equipment at bedside Yes and checklist posted at head of bed  ?Respiratory  ?Airway LDA Tracheostomy  ? ? ?

## 2021-07-10 NOTE — Progress Notes (Signed)
Pt placed in PSV by RT, pt tolerating well at this time. RN aware, RT will continue to monitor.  ?

## 2021-07-10 NOTE — Progress Notes (Signed)
? ?NAME:  Gina Bradley, MRN:  [id number], DOB:  23-Nov-1960, LOS: 20 ?ADMISSION DATE:  06/22/2021, CONSULTATION DATE:  3/21 ?REFERRING MD:  Wynetta Emery, [facility], CHIEF COMPLAINT:  acute resp failure/ fever wth AMS and SVT ? ?History of Present Illness:  ?61 yo female former smoker presented to [facility] ER on 3/21 with AMS, fever.  Found to have multifocal pneumonia and required intubation and pressors.  Transferred to [facility] for further management. ? ?Pertinent  Medical History  ?Anemia, Back pain, Spinal stenosis COPD, DM type 2, HTN, GERD, CHF, Lymphedema, Neuropathy, SVT ? ?Significant Hospital Events: ?Including procedures, antibiotic start and stop dates in addition to other pertinent events   ?3/21 Admit to [facility], intubated, start pressors, cardiology consult for SVT, start amiodarone ?3/24 transfer to [facility] ?3/28 Trach for failure to wean ?3/31 Fever, hypotension, worsening leukocytosis. Started on levophed and antibiotics. ?4/2 PICC removed for line holiday; ID following ?4/4: off precedex;and fentanyl; ID plan to stop cefepime and vanc ?4/5: remains off precedex and fentanyl; failed PSV early in morning ?4/6: remains off continuous sedation; failed PSV this morning due to desaturation ? ?Interim History / Subjective:  ?Did tolerate pressure support 4/7-trach collar ?Tolerating pressure support this morning ? ?Objective   ?Blood pressure 133/76, pulse (!) 128, temperature 99.6 ?F (37.6 ?C), temperature source Oral, resp. rate (!) 23, height 5\' 5"  (1.651 m), weight 100.3 kg, last menstrual period 01/05/2015, SpO2 94 %. ?   ?Vent Mode: PSV;CPAP ?FiO2 (%):  [50 %-60 %] 50 % ?Set Rate:  [20 bmp] 20 bmp ?Vt Set:  [460 mL] 460 mL ?PEEP:  [5 cmH20] 5 cmH20 ?Pressure Support:  [10 cmH20] 10 cmH20 ?Plateau Pressure:  [16 cmH20-22 cmH20] 22 cmH20  ? ?Intake/Output Summary (Last 24 hours) at 07/10/2021 1046 ?Last data filed at 07/10/2021 0800 ?Gross per 24 hour  ?Intake 1693.65 ml  ?Output 1200 ml  ?Net 493.65 ml  ? ?Filed Weights  ?  07/06/21 0403 07/07/21 0500 07/08/21 0307  ?Weight: 101.1 kg 101.4 kg 100.3 kg  ? ? Physical Exam: ?General: Chronically ill-appearing ?HEENT: Anterior in place ?Neuro: Follows simple commands  ?CV: S1-S2 appreciated ?PULM: Decreased breath sounds at the bases ?GI: soft, active bowel sounds, obese  ? ? ?Resolved Problems:  ?Septic shock, Elevated troponin from demand ischemia ?Urinary retention ? ?Assessment & Plan:  ? ?Acute metabolic encephalopathy ?UDS at presentation was positive for barbiturates ?Peripheral neuropathy, chronic opiate use ?-On oxy, Seroquel, gabapentin ?-Doses were increased to home dose ?-Primidone started 4/6 ? ?Acute on chronic hypoxic/hypercapnic respiratory failure from multiple cold pneumonia ?COPD with exacerbation ?S/p tracheostomy 3/28 for failure to wean ?-Continue pressure support ventilation ?-Trach collar trials ?-Continue chronic prednisone ?-Continue Brovana, Pulmicort, Yupelri ? ?Recent fever, metapneumovirus ?Chest x-ray unchanged from previous ?-Cultures remain negative ?-Trend WBC/fever curve ? ?Paroxysmal supraventricular tachycardia ?Acute on chronic heart failure with preserved ejection fraction ?Hypertension ?Hyperlipidemia ?-Continue metoprolol, spironolactone, aspirin and statin ? ?Anemia critical illness ?-Continue to trend ?Continue aspirin and statin ? ?Type 2 diabetes ?-Continue CBG monitoring ?-SSI ? ?Endometrial thickening on CT scan ?-GYN visit as outpatient ? ?Subacute inferior pubic ramus fracture ?-For outpatient orthopedic follow-up ? ?Elevated D-dimer ?-Lower extremity ultrasound negative for DVT ? ?We will continue to wean aggressively ? ?Trach collar trials as tolerated ? ?Best Practice (right click and "Reselect all SmartList Selections" daily)  ? ?Best Practice (right click and "Reselect all SmartList Selections" daily)  ? ?Diet/type: tubefeeds ?DVT prophylaxis: LMWH ?GI prophylaxis: PPI ?Lines: N/A ?Foley:  N/A ?Code  Status:  full code ?Last date of  multidisciplinary goals of care discussion. Ongoing. Possible Dispo to LTAC  ? ?The patient is critically ill with multiple organ systems failure and requires high complexity decision making for assessment and support, frequent evaluation and titration of therapies, application of advanced monitoring technologies and extensive interpretation of multiple databases. Critical Care Time devoted to patient care services described in this note independent of APP/resident time (if applicable)  is 35 minutes.  ? ?Sherrilyn Rist MD ?[facility] ?Personal pager: See Shea Evans ?If unanswered, please page ?CCM On-call: [phone] ? ? ?

## 2021-07-10 NOTE — Progress Notes (Signed)
eLink Physician-Brief Progress Note ?Patient Name: MCKENSI MARINER ?DOB: 29-Jul-1960 ?MRN: [id number] ? ? ?Date of Service ? 07/10/2021  ?HPI/Events of Note ? Bladder scan with 450 cc  ?eICU Interventions ? Insert foley for urinary retention  ? ? ? ?Intervention Category ?Minor Interventions: Other: ? ?Abdalrahman Clementson Rodman Pickle ?07/10/2021, 3:42 AM ?

## 2021-07-10 NOTE — Progress Notes (Signed)
SLP Cancellation Note ? ?Patient Details ?Name: Gina Bradley ?MRN: [id number] ?DOB: Dec 25, 1960 ? ? ?Cancelled treatment:       Reason Eval/Treat Not Completed: Patient not medically ready;Other (comment) (RN reporting that patient not following commands well enough. SLP will f/u for readiness to trial PMV next date.) ? ? ?Frances Nevin, MA, CCC-SLP ?Speech Therapy ? ?

## 2021-07-10 NOTE — Progress Notes (Addendum)
Elink notified about 450 mL bladder scan volume. Foley order placed by [facility] MD due urinary retention. Holding off on foley insertion at this time due pt voiding 400 mL. Will continue to monitor.  ?

## 2021-07-11 DIAGNOSIS — J9602 Acute respiratory failure with hypercapnia: Secondary | ICD-10-CM | POA: Diagnosis not present

## 2021-07-11 DIAGNOSIS — J9601 Acute respiratory failure with hypoxia: Secondary | ICD-10-CM | POA: Diagnosis not present

## 2021-07-11 LAB — CBC
HCT: 29.3 % — ABNORMAL LOW (ref 36.0–46.0)
Hemoglobin: 8.6 g/dL — ABNORMAL LOW (ref 12.0–15.0)
MCH: 31 pg (ref 26.0–34.0)
MCHC: 29.4 g/dL — ABNORMAL LOW (ref 30.0–36.0)
MCV: 105.8 fL — ABNORMAL HIGH (ref 80.0–100.0)
Platelets: 351 10*3/uL (ref 150–400)
RBC: 2.77 MIL/uL — ABNORMAL LOW (ref 3.87–5.11)
RDW: 19.1 % — ABNORMAL HIGH (ref 11.5–15.5)
WBC: 18 10*3/uL — ABNORMAL HIGH (ref 4.0–10.5)
nRBC: 3.7 % — ABNORMAL HIGH (ref 0.0–0.2)

## 2021-07-11 LAB — BASIC METABOLIC PANEL
Anion gap: 5 (ref 5–15)
BUN: 19 mg/dL (ref 6–20)
CO2: 36 mmol/L — ABNORMAL HIGH (ref 22–32)
Calcium: 9.1 mg/dL (ref 8.9–10.3)
Chloride: 99 mmol/L (ref 98–111)
Creatinine, Ser: 0.53 mg/dL (ref 0.44–1.00)
GFR, Estimated: >60 mL/min (ref >60)
Glucose, Bld: 127 mg/dL — ABNORMAL HIGH (ref 70–99)
Potassium: 4.7 mmol/L (ref 3.5–5.1)
Sodium: 140 mmol/L (ref 135–145)

## 2021-07-11 LAB — GLUCOSE, CAPILLARY
Glucose-Capillary: 101 mg/dL — ABNORMAL HIGH (ref 70–99)
Glucose-Capillary: 116 mg/dL — ABNORMAL HIGH (ref 70–99)
Glucose-Capillary: 119 mg/dL — ABNORMAL HIGH (ref 70–99)
Glucose-Capillary: 128 mg/dL — ABNORMAL HIGH (ref 70–99)
Glucose-Capillary: 163 mg/dL — ABNORMAL HIGH (ref 70–99)
Glucose-Capillary: 189 mg/dL — ABNORMAL HIGH (ref 70–99)

## 2021-07-11 LAB — MAGNESIUM: Magnesium: 1.9 mg/dL (ref 1.7–2.4)

## 2021-07-11 LAB — PHOSPHORUS: Phosphorus: 3.7 mg/dL (ref 2.5–4.6)

## 2021-07-11 MED ORDER — CHLORHEXIDINE GLUCONATE 0.12 % MT SOLN
OROMUCOSAL | Status: AC
  Filled 2021-07-11: qty 15

## 2021-07-11 MED ORDER — SENNA 8.6 MG PO TABS
1.0000 | ORAL_TABLET | Freq: Every day | ORAL | Status: DC
  Administered 2021-07-11 – 2021-07-24 (×13): 8.6 mg
  Filled 2021-07-11 (×13): qty 1

## 2021-07-11 NOTE — Evaluation (Signed)
Passy-Muir Speaking Valve - Evaluation ?Patient Details  ?Name: Gina Bradley ?MRN: [id number] ?Date of Birth: 1960-09-22 ? ?Today's Date: 07/11/2021 ?Time: 1257-1310 ?SLP Time Calculation (min) (ACUTE ONLY): 13 min ? ?Past Medical History:  ?Past Medical History:  ?Diagnosis Date  ? Anemia   ? Cervical disc disease   ? COPD (chronic obstructive pulmonary disease) ([place])   ? Diabetes mellitus without complication ([place])   ? Essential hypertension   ? GERD (gastroesophageal reflux disease)   ? Heart failure ([place]) 2018  ? History of cardiac catheterization   ? Normal coronaries March 2015  ? History of non-ST elevation myocardial infarction (NSTEMI)   ? Secondary to SVT  ? Lymphedema   ? Peripheral neuropathy   ? Spinal stenosis   ? SVT (supraventricular tachycardia) ([place])   ? ?Past Surgical History:  ?Past Surgical History:  ?Procedure Laterality Date  ? CESAREAN SECTION    ? ESOPHAGOGASTRODUODENOSCOPY N/A 02/10/2014  ? [id number] tiny antral erosion; otherwise normal EGD. No explanation for patient's symptoms. Gallbladder needs further evaluation.  ? LEFT HEART CATHETERIZATION WITH CORONARY ANGIOGRAM N/A 06/03/2013  ? Procedure: LEFT HEART CATHETERIZATION WITH CORONARY ANGIOGRAM;  Surgeon: Sinclair Grooms, MD;  Location: [facility] CATH LAB;  Service: Cardiovascular;  Laterality: N/A;  ? ?HPI:  ?61 yo female former smoker presented to [facility] ER on 3/21 with AMS, fever.  Found to have multifocal pneumonia and required intubation and pressors.  Transferred to [facility] for further management. ETT 3/21; trach 3/28 secondary to failure to wean; TC trials 4/8. Dx acute metabolic encephalopathy, pmhx peripheral neuropathy, HF, HTN, DM2.  ? ? ?Assessment / Plan / Recommendation ? ?Clinical Impression ? PMV assessment initiated.  Pt on ATC for several hours.  Currently with #8 Shiley with cuff inflated. Cuff was deflated; syringe left on balloon line.  Pt restless; not following oral-motor commands or opening eyes, coughing without attempts to  voice.  PMV placed for intervals of 1-3 breath cycles - no obvious air trapping noted upon removal, but coughing was continuous and pt appeared to be uncomfortable. Valve removed; cuff reinflated and pt was left to rest. Will continue to follow for readiness; tolerance will improve along with mental status and smaller trach size.  For now, PMV trials with SLP only. ?SLP Visit Diagnosis: Aphonia (R49.1)  ?  ?SLP Assessment ? Patient needs continued Speech Lanaguage Pathology Services  ?  ?Recommendations for follow up therapy are one component of a multi-disciplinary discharge planning process, led by the attending physician.  Recommendations may be updated based on patient status, additional functional criteria and insurance authorization. ? ?Follow Up Recommendations ? Other (comment) (tba) ? ?  ?Assistance Recommended at Discharge Frequent or constant Supervision/Assistance  ?Functional Status Assessment Patient has had a recent decline in their functional status and demonstrates the ability to make significant improvements in function in a reasonable and predictable amount of time.  ?Frequency and Duration min 3x week  ?2 weeks ?  ? ?PMSV Trial PMSV was placed for: intervals of 1-3 breath cycles ?Able to redirect subglottic air through upper airway:  (intermittently) ?Able to Attain Phonation: Yes ?Voice Quality: Low vocal intensity ?Able to Expectorate Secretions: Yes ?Level of Secretion Expectoration with PMSV: Oral;Tracheal ?Breath Support for Phonation: Inadequate ?Intelligibility: Unable to assess (comment) ?SpO2 During Trial: 95 % ?Behavior: Lethargic ?  ?Tracheostomy Tube ? Additional Tracheostomy Tube Assessment ?Fenestrated: No  ?  ?Vent Dependency ? Vent Dependent: No ?FiO2 (%): 50 %  ?  ?Cuff Deflation Trial Tolerated  Cuff Deflation: Yes ?Length of Time for Cuff Deflation Trial: 10 ?Behavior: Restless  ?Tenelle Andreason L. Yassir Enis, MA CCC/SLP ?Acute Rehabilitation Services ?Office number [phone] ?Pager  [phone] ?   ? ? ?  ?Juan Quam Laurice ?07/11/2021, 1:32 PM ? ?

## 2021-07-11 NOTE — Progress Notes (Signed)
? ?NAME:  Gina Bradley, MRN:  [id number], DOB:  1960-05-25, LOS: 62 ?ADMISSION DATE:  06/22/2021, CONSULTATION DATE:  3/21 ?REFERRING MD:  Wynetta Emery, Triad, CHIEF COMPLAINT:  acute resp failure/ fever wth AMS and SVT ? ?History of Present Illness:  ?61 yo female former smoker presented to [facility] ER on 3/21 with AMS, fever.  Found to have multifocal pneumonia and required intubation and pressors.  Transferred to [facility] for further management. ? ?Pertinent  Medical History  ?Anemia, Back pain, Spinal stenosis COPD, DM type 2, HTN, GERD, CHF, Lymphedema, Neuropathy, SVT ? ?Significant Hospital Events: ?Including procedures, antibiotic start and stop dates in addition to other pertinent events   ?3/21 Admit to [facility], intubated, start pressors, cardiology consult for SVT, start amiodarone ?3/24 transfer to [facility] ?3/28 Trach for failure to wean ?3/31 Fever, hypotension, worsening leukocytosis. Started on levophed and antibiotics. ?4/2 PICC removed for line holiday; ID following ?4/4: off precedex;and fentanyl; ID plan to stop cefepime and vanc ?4/5: remains off precedex and fentanyl; failed PSV early in morning ?4/6: remains off continuous sedation; failed PSV this morning due to desaturation ?4/9: Tolerated being off the vent for about 8 hours yesterday 4/8 ? ?Interim History / Subjective:  ?Tolerated trach collar 4/8 ?Being weaned at present ? ?Objective   ?Blood pressure 97/64, pulse 98, temperature 99.3 ?F (37.4 ?C), temperature source Oral, resp. rate (!) 23, height 5\' 5"  (1.651 m), weight 100.3 kg, last menstrual period 01/05/2015, SpO2 100 %. ?   ?Vent Mode: PRVC ?FiO2 (%):  [50 %-60 %] 50 % ?Set Rate:  [20 bmp] 20 bmp ?Vt Set:  [460 mL] 460 mL ?PEEP:  [5 cmH20] 5 cmH20 ?Plateau Pressure:  [19 cmH20-21 cmH20] 21 cmH20  ? ?Intake/Output Summary (Last 24 hours) at 07/11/2021 0840 ?Last data filed at 07/11/2021 0802 ?Gross per 24 hour  ?Intake 1710 ml  ?Output 1475 ml  ?Net 235 ml  ? ?Filed Weights  ? 07/06/21 0403 07/07/21 0500  07/08/21 0307  ?Weight: 101.1 kg 101.4 kg 100.3 kg  ? ? Physical Exam: ?General: Chronically ill-appearing ?HEENT: Moist oral mucosa ?Neuro: She does follow commands ?CV: S1-S2 appreciated ?PULM: Decreased breath sounds at the bases ?GI: Bowel sounds appreciated  ? ? ?Resolved Problems:  ?Septic shock, Elevated troponin from demand ischemia ?Urinary retention ? ?Assessment & Plan:  ? ?Acute metabolic encephalopathy ?UDS at presentation was positive for barbiturates ?Peripheral neuropathy, chronic opiate use ?-On oxy, Seroquel, gabapentin--medication doses increased to home doses ?-Primidone restarted 4/6 ? ?Acute on chronic hypoxic/hypercapnic respiratory failure ?Pneumonia ?COPD with exacerbation ?-S/p tracheostomy 3/28 for failure to wean ?-Continue trach collar trials ?-Continue chronic prednisone ?-Continue Brovana, Pulmicort, Yupelri ? ?Recent fever, metapneumovirus infection ?-Cultures negative ?-Continue to trend WBC/fever curve ? ?Paroxysmal supraventricular tachycardia ?Acute on chronic heart failure with preserved ejection fraction ?Hypertension ?Hyperlipidemia ?-Continue metoprolol, spironolactone, aspirin and statin ? ?Anemia of critical illness ?-Continue to trend ? ?Type 2 diabetes ?-Continue CBG monitoring ?-SSI ? ?Endometrial thickening on CT scan ?-To follow-up with GYN as outpatient ? ?Elevated D-dimer ?-Lower extremity ultrasound negative for DVT ? ?Continue trach collar trials as tolerated ? ? ?Best Practice (right click and "Reselect all SmartList Selections" daily)  ? ?Best Practice (right click and "Reselect all SmartList Selections" daily)  ? ?Diet/type: tubefeeds ?DVT prophylaxis: LMWH ?GI prophylaxis: PPI ?Lines: N/A ?Foley:  N/A ?Code Status:  full code ?Last date of multidisciplinary goals of care discussion. Ongoing. Possible Dispo to LTAC  ? ?The patient is critically ill with multiple organ  systems failure and requires high complexity decision making for assessment and support,  frequent evaluation and titration of therapies, application of advanced monitoring technologies and extensive interpretation of multiple databases. Critical Care Time devoted to patient care services described in this note independent of APP/resident time (if applicable)  is 30 minutes.  ? ?Sherrilyn Rist MD ?[facility] ?Personal pager: See Shea Evans ?If unanswered, please page ?CCM On-call: [phone] ?

## 2021-07-12 ENCOUNTER — Inpatient Hospital Stay (HOSPITAL_COMMUNITY): Payer: 59

## 2021-07-12 DIAGNOSIS — I5032 Chronic diastolic (congestive) heart failure: Secondary | ICD-10-CM

## 2021-07-12 DIAGNOSIS — K219 Gastro-esophageal reflux disease without esophagitis: Secondary | ICD-10-CM | POA: Diagnosis not present

## 2021-07-12 DIAGNOSIS — J9601 Acute respiratory failure with hypoxia: Secondary | ICD-10-CM | POA: Diagnosis not present

## 2021-07-12 DIAGNOSIS — G9341 Metabolic encephalopathy: Secondary | ICD-10-CM | POA: Diagnosis not present

## 2021-07-12 LAB — GLUCOSE, CAPILLARY
Glucose-Capillary: 105 mg/dL — ABNORMAL HIGH (ref 70–99)
Glucose-Capillary: 152 mg/dL — ABNORMAL HIGH (ref 70–99)
Glucose-Capillary: 181 mg/dL — ABNORMAL HIGH (ref 70–99)
Glucose-Capillary: 184 mg/dL — ABNORMAL HIGH (ref 70–99)
Glucose-Capillary: 93 mg/dL (ref 70–99)
Glucose-Capillary: 93 mg/dL (ref 70–99)

## 2021-07-12 LAB — BASIC METABOLIC PANEL
Anion gap: 11 (ref 5–15)
BUN: 18 mg/dL (ref 6–20)
CO2: 36 mmol/L — ABNORMAL HIGH (ref 22–32)
Calcium: 9.3 mg/dL (ref 8.9–10.3)
Chloride: 95 mmol/L — ABNORMAL LOW (ref 98–111)
Creatinine, Ser: 0.5 mg/dL (ref 0.44–1.00)
GFR, Estimated: >60 mL/min (ref >60)
Glucose, Bld: 138 mg/dL — ABNORMAL HIGH (ref 70–99)
Potassium: 4.8 mmol/L (ref 3.5–5.1)
Sodium: 142 mmol/L (ref 135–145)

## 2021-07-12 LAB — CBC
HCT: 28.9 % — ABNORMAL LOW (ref 36.0–46.0)
Hemoglobin: 9.2 g/dL — ABNORMAL LOW (ref 12.0–15.0)
MCH: 31.1 pg (ref 26.0–34.0)
MCHC: 31.8 g/dL (ref 30.0–36.0)
MCV: 97.6 fL (ref 80.0–100.0)
Platelets: 348 10*3/uL (ref 150–400)
RBC: 2.96 MIL/uL — ABNORMAL LOW (ref 3.87–5.11)
RDW: 19.4 % — ABNORMAL HIGH (ref 11.5–15.5)
WBC: 22.2 10*3/uL — ABNORMAL HIGH (ref 4.0–10.5)
nRBC: 1.4 % — ABNORMAL HIGH (ref 0.0–0.2)

## 2021-07-12 LAB — MAGNESIUM: Magnesium: 1.9 mg/dL (ref 1.7–2.4)

## 2021-07-12 LAB — PHOSPHORUS: Phosphorus: 4.5 mg/dL (ref 2.5–4.6)

## 2021-07-12 MED ORDER — QUETIAPINE FUMARATE 100 MG PO TABS
100.0000 mg | ORAL_TABLET | Freq: Two times a day (BID) | ORAL | Status: DC
  Administered 2021-07-12: 100 mg
  Filled 2021-07-12: qty 1

## 2021-07-12 MED ORDER — CLONAZEPAM 0.5 MG PO TABS
0.5000 mg | ORAL_TABLET | Freq: Four times a day (QID) | ORAL | Status: DC
  Administered 2021-07-12 – 2021-07-13 (×3): 0.5 mg
  Filled 2021-07-12 (×4): qty 1

## 2021-07-12 MED ORDER — MAGNESIUM SULFATE 2 GM/50ML IV SOLN
2.0000 g | Freq: Once | INTRAVENOUS | Status: AC
  Administered 2021-07-12: 2 g via INTRAVENOUS
  Filled 2021-07-12: qty 50

## 2021-07-12 MED ORDER — METOPROLOL TARTRATE 25 MG PO TABS
25.0000 mg | ORAL_TABLET | Freq: Two times a day (BID) | ORAL | Status: DC
  Administered 2021-07-12 – 2021-07-24 (×26): 25 mg
  Filled 2021-07-12 (×27): qty 1

## 2021-07-12 NOTE — Progress Notes (Signed)
Physical Therapy Treatment ?Patient Details ?Name: Gina Bradley ?MRN: [id number] ?DOB: 1960/06/08 ?Today's Date: 07/12/2021 ? ? ?History of Present Illness 61 yo female former smoker presented to [facility] ER on 3/21 with AMS, fever.  Found to have multifocal pneumonia and required intubation and pressors.  Transferred to [facility] 06/25/21 for treatment of acute on chronic hypoxic/hypercapnic respiratory failure from PNA, COPD with acute COPD, paroxysmal SVT, acute on chronic diastolic CHF, and acute metabolic encephalopathy in presence of peripheral neuropathy, back pain and chronic opiate use. PMH: CAD, smoker, heart failure, GERD, type 2 DM, hypertension, SVT, peripheral neuropathy, steroid dependent COPD, chronic pain history of DVT, chronic lymphedema, spinal stenosis, was recently diagnosed with compression deformity of the L4 vertebrae and subacute fracture of the right inferior pubic rami seen in the ED on 06/06/2021 after fall. ? ?  ?PT Comments  ? ? Pt making slow progress. Pt able to tolerating sitting EOB briefly with assist. Continue to recommend LTACH.    ?Recommendations for follow up therapy are one component of a multi-disciplinary discharge planning process, led by the attending physician.  Recommendations may be updated based on patient status, additional functional criteria and insurance authorization. ? ?Follow Up Recommendations ? PT at Long-term acute care hospital ?  ?  ?Assistance Recommended at Discharge Frequent or constant Supervision/Assistance  ?Patient can return home with the following Two people to help with walking and/or transfers;A lot of help with bathing/dressing/bathroom;Assistance with cooking/housework;Assistance with feeding;Direct supervision/assist for medications management;Direct supervision/assist for financial management;Assist for transportation;Help with stairs or ramp for entrance ?  ?Equipment Recommendations ? BSC/3in1;Wheelchair (measurements PT);Wheelchair cushion  (measurements PT);Hospital bed;Other (comment) (hoyer lift)  ?  ?Recommendations for Other Services   ? ? ?  ?Precautions / Restrictions Precautions ?Precautions: Fall ?Precaution Comments: hx of fall with L4 compression fx  ?  ? ?Mobility ? Bed Mobility ?Overal bed mobility: Needs Assistance ?Bed Mobility: Supine to Sit, Sit to Supine ?  ?  ?Supine to sit: Max assist, HOB elevated ?Sit to supine: Total assist ?  ?General bed mobility comments: Assist to bring legs off of bed, elevate trunk into supine, and bring hips to EOB. Assist for all aspects returning to supine ?  ? ?Transfers ?  ?  ?  ?  ?  ?  ?  ?  ?  ?  ?  ? ?Ambulation/Gait ?  ?  ?  ?  ?  ?  ?  ?  ? ? ?Stairs ?  ?  ?  ?  ?  ? ? ?Wheelchair Mobility ?  ? ?Modified Rankin (Stroke Patients Only) ?  ? ? ?  ?Balance Overall balance assessment: Needs assistance ?Sitting-balance support: Bilateral upper extremity supported, Feet unsupported ?Sitting balance-Leahy Scale: Poor ?Sitting balance - Comments: Pt sat EOB x 5 minutes with min to mod assist for balance. ?  ?  ?  ?  ?  ?  ?  ?  ?  ?  ?  ?  ?  ?  ?  ?  ? ?  ?Cognition Arousal/Alertness: Awake/alert ?Behavior During Therapy: Restless ?Overall Cognitive Status: Difficult to assess ?  ?  ?  ?  ?  ?  ?  ?  ?  ?  ?  ?  ?  ?  ?  ?  ?General Comments: Pt following some 1 step commands. Nods/shakes head appropriately. ?  ?  ? ?  ?Exercises General Exercises - Lower Extremity ?Long Arc Quad: AROM, Both, 5 reps, Seated ? ?  ?  General Comments General comments (skin integrity, edema, etc.): BP in sitting 90's/60's. Pt denied light headedness. BP in supine 120's/80's. Pt on trach collar with SpO2 >92%. ?  ?  ? ?Pertinent Vitals/Pain Pain Assessment ?Facial Expression: Relaxed, neutral ?Body Movements: Restlessness ?Muscle Tension: Relaxed ?Compliance with ventilator (intubated pts.): N/A ?Vocalization (extubated pts.): Talking in normal tone or no sound ?CPOT Total: 2  ? ? ?Home Living   ?  ?  ?  ?  ?  ?  ?  ?  ?  ?    ?  ?Prior Function    ?  ?  ?   ? ?PT Goals (current goals can now be found in the care plan section) Acute Rehab PT Goals ?Patient Stated Goal: none stated ?Progress towards PT goals: Progressing toward goals ? ?  ?Frequency ? ? ? Min 2X/week ? ? ? ?  ?PT Plan Current plan remains appropriate  ? ? ?Co-evaluation   ?  ?  ?  ?  ? ?  ?AM-PAC PT "6 Clicks" Mobility   ?Outcome Measure ? Help needed turning from your back to your side while in a flat bed without using bedrails?: Total ?Help needed moving from lying on your back to sitting on the side of a flat bed without using bedrails?: A Lot ?Help needed moving to and from a bed to a chair (including a wheelchair)?: Total ?Help needed standing up from a chair using your arms (e.g., wheelchair or bedside chair)?: Total ?Help needed to walk in hospital room?: Total ?Help needed climbing 3-5 steps with a railing? : Total ?6 Click Score: 7 ? ?  ?End of Session Equipment Utilized During Treatment: Oxygen ?Activity Tolerance: Patient tolerated treatment well ?Patient left: in bed;with call bell/phone within reach;with bed alarm set ?Nurse Communication: Mobility status ?PT Visit Diagnosis: Unsteadiness on feet (R26.81);Muscle weakness (generalized) (M62.81);Other abnormalities of gait and mobility (R26.89);History of falling (Z91.81);Difficulty in walking, not elsewhere classified (R26.2) ?  ? ? ?Time: [id number] ?PT Time Calculation (min) (ACUTE ONLY): 18 min ? ?Charges:  $Therapeutic Activity: 8-22 mins          ?          ? ?[facility] PT ?Acute Rehabilitation Services ?Office [phone] ? ? ? ?Shary Decamp [facility] ?07/12/2021, 6:15 PM ? ?

## 2021-07-12 NOTE — TOC Progression Note (Signed)
Transition of Care (TOC) - Initial/Assessment Note  ? ? ?Patient Details  ?Name: Gina Bradley ?MRN: [id number] ?Date of Birth: 1960-10-21 ? ?Transition of Care (TOC) CM/SW Contact:    ?Catalina Pizza Ayomide Purdy, LCSWA ?Phone Number: ?07/12/2021, 4:21 PM ? ?Clinical Narrative:                 ?CSW received a call from [place] with [facility] and was informed that the peer to peer for insurance was denied.  CSW updated MD on the reason for denial. See below. ? ?(Rationale: 1.  Member continues with Cortrack feeding tube without plan for PEG (permanent feeding source) or rationale for why this is not appropriate for member. 2. WBC is increasing and there is a no recent stable chest xray. Until these issues are resolved, member has not completed the initial course of care in the Short Term Acute Care hospital.  ? ? ?Expected Discharge Plan: Long Term Acute Care (LTAC) ?Barriers to Discharge: Continued Medical Work up ? ? ?Patient Goals and CMS Choice ?Patient states their goals for this hospitalization and ongoing recovery are:: Unable to assess. Patient on full ventillation. ?CMS Medicare.gov Compare Post Acute Care list provided to:: Patient Represenative (must comment) (Daughter, Toniann Fail.) ?  ? ?Expected Discharge Plan and Services ?Expected Discharge Plan: Long Term Acute Care (LTAC) ?  ?Discharge Planning Services: CM Consult ?Post Acute Care Choice:  (LTACH) ?Living arrangements for the past 2 months: Single Family Home ?                ?  ?  ?  ?  ?  ?  ?  ?  ?  ?  ? ?Prior Living Arrangements/Services ?Living arrangements for the past 2 months: Single Family Home ?Lives with:: Spouse ?Patient language and need for interpreter reviewed:: Yes ?Do you feel safe going back to the place where you live?: Yes      ?Need for Family Participation in Patient Care: Yes (Comment) ?Care giver support system in place?: Yes (comment) ?  ?Criminal Activity/Legal Involvement Pertinent to Current Situation/Hospitalization: No - Comment as  needed ? ?Activities of Daily Living ?  ?  ? ?Permission Sought/Granted ?Permission sought to share information with : Case Manager, Magazine features editor, Family Supports ?Permission granted to share information with : Yes, Verbal Permission Granted ?   ?   ?   ?   ? ?Emotional Assessment ?Appearance:: Appears stated age ?Attitude/Demeanor/Rapport: Other (comment) (Full ventillation, trach) ?Affect (typically observed): Other (comment) ?Orientation: : Oriented to Self ?Alcohol / Substance Use: Not Applicable ?Psych Involvement: No (comment) ? ?Admission diagnosis:  Acute respiratory failure with hypoxia (HCC) [J96.01] ?Multifocal pneumonia [J18.9] ?Sepsis, due to unspecified organism, unspecified whether acute organ dysfunction present (HCC) [A41.9] ?Patient Active Problem List  ? Diagnosis Date Noted  ? Sepsis (HCC)   ? Acute metabolic encephalopathy 06/23/2021  ? Opioid dependence (HCC) 06/23/2021  ? Multifocal pneumonia 06/23/2021  ? Obesity, Class III, BMI 40-49.9 (morbid obesity) (HCC) 06/23/2021  ? Transaminasemia 06/23/2021  ? Acute respiratory failure with hypoxia and hypercarbia (HCC) 06/22/2021  ? Severe sepsis (HCC) 06/22/2021  ? Right subacute Inferior pubic ramus fracture 06/22/2021  ? Endometrial thickening on CT scan 06/22/2021  ? Chronic heart failure with preserved ejection fraction (HFpEF) (HCC) 06/22/2021  ? Uncontrolled type 2 diabetes mellitus with hyperglycemia, without long-term current use of insulin (HCC) 08/18/2020  ? Tick bite of left lower leg   ? Peripheral edema 08/17/2020  ? Hypokalemia   ?  Class 1 obesity due to excess calories with body mass index (BMI) of 33.0 to 33.9 in adult   ? Cellulitis   ? Acidosis, metabolic 06/27/2019  ? PMB (postmenopausal bleeding) 10/06/2016  ? Chest pressure 01/05/2015  ? Gastric erosion   ? Encounter for screening colonoscopy 01/22/2014  ? Constipation 01/20/2014  ? GERD (gastroesophageal reflux disease) 01/20/2014  ? Dyspepsia 01/20/2014  ?  PSVT (paroxysmal supraventricular tachycardia) (HCC) 05/31/2013  ? HTN (hypertension), benign 05/31/2013  ? Hypercholesteremia 05/31/2013  ? ?PCP:  Elfredia Nevins, MD ?Pharmacy:   ?[facility] - [place], [place] - [address] ?[address] ?[place] [address] ?Phone: [phone] Fax: [phone] ? ? ? ? ?Social Determinants of Health (SDOH) Interventions ?  ? ?Readmission Risk Interventions ?   ? View : No data to display.  ?  ?  ?  ? ? ? ?

## 2021-07-12 NOTE — Progress Notes (Signed)
? ?NAME:  Gina Bradley, MRN:  [id number], DOB:  1960-04-20, LOS: 25 ?ADMISSION DATE:  06/22/2021, CONSULTATION DATE:  3/21 ?REFERRING MD:  Wynetta Emery, Triad, CHIEF COMPLAINT:  acute resp failure/ fever wth AMS and SVT ? ?History of Present Illness:  ?61 yo female former smoker presented to [facility] ER on 3/21 with AMS, fever.  Found to have multifocal pneumonia and required intubation and pressors.  Transferred to [facility] for further management. ? ?Pertinent  Medical History  ?Anemia, Back pain, Spinal stenosis COPD, DM type 2, HTN, GERD, CHF, Lymphedema, Neuropathy, SVT ? ?Significant Hospital Events: ?Including procedures, antibiotic start and stop dates in addition to other pertinent events   ?3/21 Admit to [facility], intubated, start pressors, cardiology consult for SVT, start amiodarone ?3/24 transfer to [facility] ?3/28 Trach for failure to wean ?3/31 Fever, hypotension, worsening leukocytosis. Started on levophed and antibiotics. ?4/2 PICC removed for line holiday; ID following ?4/4: off precedex;and fentanyl; ID plan to stop cefepime and vanc ?4/5: remains off precedex and fentanyl; failed PSV early in morning ?4/6: remains off continuous sedation; failed PSV this morning due to desaturation ?4/9: Tolerated being off the vent for about 8 hours yesterday 4/8 ? ?Interim History / Subjective:  ?White count remains elevated ?She has copious amount of thick yellow-green secretions via trach ?Remains restless and agitated ? ?Objective   ?Blood pressure 128/74, pulse (!) 118, temperature 98.1 ?F (36.7 ?C), temperature source Oral, resp. rate (!) 23, height 5\' 5"  (1.651 m), weight 100.3 kg, last menstrual period 01/05/2015, SpO2 91 %. ?   ?FiO2 (%):  [35 %-50 %] 35 %  ? ?Intake/Output Summary (Last 24 hours) at 07/12/2021 0759 ?Last data filed at 07/12/2021 0600 ?Gross per 24 hour  ?Intake 2385.11 ml  ?Output 2000 ml  ?Net 385.11 ml  ? ?Filed Weights  ? 07/06/21 0403 07/07/21 0500 07/08/21 0307  ?Weight: 101.1 kg 101.4 kg 100.3 kg  ? ?  Physical Exam: ?General: Acute on chronically ill-appearing female, lying on the bed ?HEENT: [name], eyes anicteric.  moist mucus membranes ?Neuro: Eyes closed, opens eyes with vocal stimuli, intermittently following few commands, gets agitated easily ?Chest: Coarse breath sounds, no wheezes or rhonchi ?Heart: Regular rate and rhythm, no murmurs or gallops ?Abdomen: Soft, nontender, nondistended, bowel sounds present ?Skin: No rash ? ? ? ?Resolved Problems:  ?Septic shock, Elevated troponin from demand ischemia ?Urinary retention ? ?Assessment & Plan:  ?Acute metabolic/toxic encephalopathy ?UDS at presentation was positive for barbiturates/Benzo/opiate and cannabis ?Peripheral neuropathy, chronic opiate use ?Continue oxy, Seroquel and gabapentin ?Restarted back on primidone ?Decrease Klonopin to 0.5 mg every 6 hours and decrease Seroquel to 100 mg every 12 ? ?Acute on chronic hypoxic/hypercapnic respiratory failure ?Aspiration pneumonia ?Acute COPD with exacerbation ?S/p tracheostomy 3/28 for failure to wean ?Continue trach collar trials as long as she tolerates ?Continue chronic prednisone 10 mg once daily ?Continue Brovana, Pulmicort, Yupelri ? ?Recent fever, metapneumovirus infection ?Patient continued to spike fever ?White count remains elevated ?We will send respiratory culture again ? ?Paroxysmal supraventricular tachycardia ?Acute on chronic heart failure with preserved ejection fraction ?Hypertension ?Hyperlipidemia ?Continue metoprolol, spironolactone, aspirin and statin ? ?Anemia of critical illness ?H&H remained stable, closely monitor ? ?Type 2 diabetes ?Continue SSI ? ?Endometrial thickening on CT scan ?Will need GYN as outpatient I can help if they want my ? ? ?Best Practice (right click and "Reselect all SmartList Selections" daily)  ? ?Best Practice (right click and "Reselect all SmartList Selections" daily)  ? ?Diet/type: tubefeeds ?DVT prophylaxis:  LMWH ?GI prophylaxis: PPI ?Lines: N/A ?Foley:   N/A ?Code Status:  full code ?Last date of multidisciplinary goals of care discussion. Ongoing. Possible Dispo to LTAC  ? ?Total critical care time: 35 minutes ? ?Performed by: Jacky Kindle ?  ?Critical care time was exclusive of separately billable procedures and treating other patients. ?  ?Critical care was necessary to treat or prevent imminent or life-threatening deterioration. ?  ?Critical care was time spent personally by me on the following activities: development of treatment plan with patient and/or surrogate as well as nursing, discussions with consultants, evaluation of patient's response to treatment, examination of patient, obtaining history from patient or surrogate, ordering and performing treatments and interventions, ordering and review of laboratory studies, ordering and review of radiographic studies, pulse oximetry and re-evaluation of patient's condition. ?  ?Jacky Kindle MD ?[facility] ?See Amion for pager ?If no response to pager, please call [phone] until 7pm ?After 7pm, Please call E-link [phone] ? ?

## 2021-07-12 NOTE — Progress Notes (Signed)
[facility] ADULT ICU REPLACEMENT PROTOCOL ? ? ?The patient does apply for the [facility] Adult ICU Electrolyte Replacment Protocol based on the criteria listed below:  ? ?1.Exclusion criteria: TCTS patients, ECMO patients, and Dialysis patients ?2. Is GFR >/= 30 ml/min? Yes.    ?Patient's GFR today is >60 ?3. Is SCr </= 2? Yes.   ?Patient's SCr is 0.50 mg/dL ?4. Did SCr increase >/= 0.5 in 24 hours? No. ?5.Pt's weight >40kg  Yes.   ?6. Abnormal electrolyte(s): Mag 1.9  ?7. Electrolytes replaced per protocol ?8.  Call MD STAT for K+ </= 2.5, Phos </= 1, or Mag </= 1 ?Physician:  Dr. Letha Cape ? ?Gina Bradley 07/12/2021 5:21 AM  ?

## 2021-07-12 NOTE — Progress Notes (Signed)
Nutrition Follow-up ? ?DOCUMENTATION CODES:  ? ?Obesity unspecified ? ?INTERVENTION:  ? ?Continue tube feeds via Cortrak: ?- Vital 1.5 @ 50 ml/hr (1200 ml/day) ?- ProSource TF 45 ml QID ?- Free water flushes of 100 ml q 6 hours ?  ?Tube feeding regimen provides 1960 kcal, 125 grams of protein, and 917 ml of H2O. ?  ?Total free water with flushes: 1317 ml ? ?NUTRITION DIAGNOSIS:  ? ?Inadequate oral intake related to inability to eat as evidenced by NPO status. ? ?Ongoing, being addressed via TF ? ?GOAL:  ? ?Patient will meet greater than or equal to 90% of their needs ? ?Met via TF ? ?MONITOR:  ? ?Diet advancement, Labs, Weight trends, TF tolerance, I & O's ? ?REASON FOR ASSESSMENT:  ? ?Ventilator ?  ? ?ASSESSMENT:  ? ?61 year old female with PMH of T2DM, CHF, COPD, GERD, chronic lymphedema who presented with AMS and required intubation. ? ?03/24 - transferred to [facility] ?03/28 - s/p tracheostomy ?03/29 - Cortrak placed (tip gastric) ? ?Discussed pt with RN. Pt tolerating current tube feeds via Cortrak without issue. Pt has been tolerating trach collar. Will continue with current regimen at this time. Pt working with SLP toward [place]. Pt pending discharge to [facility] ? ?Current TF: Vital 1.5 @ 50 ml/hr, ProSource TF 45 ml QID, free water flushes of 100 ml q 6 hours ? ?Admit weight: 100 kg ?Current weight: 100.3 kg ? ?Pt with moderate pitting edema to BUE and BLE. ? ?Medications reviewed and include: SSI q 4 hours, novolog 4 units q 4 hours, levemir 10 units BID, protonix, prednisone, senna, spironolactone, IV abx ? ?Labs reviewed: WBC 22.3, hemoglobin 8.7 ?CBG's: 84-181 x 24 hours ? ?UOP: 2350 ml x 24 hours ?I/O's: +11.4 L since admit ? ?Diet Order:   ?Diet Order   ? ?       ?  Diet NPO time specified  Diet effective now       ?  ? ?  ?  ? ?  ? ? ?EDUCATION NEEDS:  ? ?No education needs have been identified at this time ? ?Skin:  Skin Assessment: Reviewed RN Assessment ? ?Last BM:  07/13/21 multiple type 7 ? ?Height:  ? ?Ht  Readings from Last 1 Encounters:  ?06/30/21 _0  (1.651 m)  ? ? ?Weight:  ? ?Wt Readings from Last 1 Encounters:  ?07/08/21 100.3 kg  ? ? ?Ideal Body Weight:  56.8 kg ? ?BMI:  Body mass index is 36.8 kg/m?. ? ?Estimated Nutritional Needs:  ? ?Kcal:  1900-2100 ? ?Protein:  115-130 grams ? ?Fluid:  1.9 L/day ? ? ? ?Gustavus Bryant, MS, RD, LDN ?Inpatient Clinical Dietitian ?Please see AMiON for contact information. ? ?

## 2021-07-13 DIAGNOSIS — K2100 Gastro-esophageal reflux disease with esophagitis, without bleeding: Secondary | ICD-10-CM

## 2021-07-13 DIAGNOSIS — R7401 Elevation of levels of liver transaminase levels: Secondary | ICD-10-CM

## 2021-07-13 DIAGNOSIS — A419 Sepsis, unspecified organism: Secondary | ICD-10-CM | POA: Diagnosis not present

## 2021-07-13 DIAGNOSIS — G9341 Metabolic encephalopathy: Secondary | ICD-10-CM | POA: Diagnosis not present

## 2021-07-13 DIAGNOSIS — I471 Supraventricular tachycardia: Secondary | ICD-10-CM

## 2021-07-13 DIAGNOSIS — J9601 Acute respiratory failure with hypoxia: Secondary | ICD-10-CM | POA: Diagnosis not present

## 2021-07-13 DIAGNOSIS — I5032 Chronic diastolic (congestive) heart failure: Secondary | ICD-10-CM | POA: Diagnosis not present

## 2021-07-13 DIAGNOSIS — I10 Essential (primary) hypertension: Secondary | ICD-10-CM

## 2021-07-13 LAB — CBC
HCT: 29.4 % — ABNORMAL LOW (ref 36.0–46.0)
Hemoglobin: 8.7 g/dL — ABNORMAL LOW (ref 12.0–15.0)
MCH: 30.5 pg (ref 26.0–34.0)
MCHC: 29.6 g/dL — ABNORMAL LOW (ref 30.0–36.0)
MCV: 103.2 fL — ABNORMAL HIGH (ref 80.0–100.0)
Platelets: 359 10*3/uL (ref 150–400)
RBC: 2.85 MIL/uL — ABNORMAL LOW (ref 3.87–5.11)
RDW: 19.2 % — ABNORMAL HIGH (ref 11.5–15.5)
WBC: 22.3 10*3/uL — ABNORMAL HIGH (ref 4.0–10.5)
nRBC: 1.7 % — ABNORMAL HIGH (ref 0.0–0.2)

## 2021-07-13 LAB — CBC WITH DIFFERENTIAL/PLATELET
Abs Immature Granulocytes: 1.76 10*3/uL — ABNORMAL HIGH (ref 0.00–0.07)
Basophils Absolute: 0.2 10*3/uL — ABNORMAL HIGH (ref 0.0–0.1)
Basophils Relative: 1 %
Eosinophils Absolute: 0.2 10*3/uL (ref 0.0–0.5)
Eosinophils Relative: 1 %
HCT: 27.7 % — ABNORMAL LOW (ref 36.0–46.0)
Hemoglobin: 8.7 g/dL — ABNORMAL LOW (ref 12.0–15.0)
Immature Granulocytes: 9 %
Lymphocytes Relative: 13 %
Lymphs Abs: 2.6 10*3/uL (ref 0.7–4.0)
MCH: 32 pg (ref 26.0–34.0)
MCHC: 31.4 g/dL (ref 30.0–36.0)
MCV: 101.8 fL — ABNORMAL HIGH (ref 80.0–100.0)
Monocytes Absolute: 1.9 10*3/uL — ABNORMAL HIGH (ref 0.1–1.0)
Monocytes Relative: 10 %
Neutro Abs: 12.9 10*3/uL — ABNORMAL HIGH (ref 1.7–7.7)
Neutrophils Relative %: 66 %
Platelets: 338 10*3/uL (ref 150–400)
RBC: 2.72 MIL/uL — ABNORMAL LOW (ref 3.87–5.11)
RDW: 19.5 % — ABNORMAL HIGH (ref 11.5–15.5)
WBC: 19.5 10*3/uL — ABNORMAL HIGH (ref 4.0–10.5)
nRBC: 1.8 % — ABNORMAL HIGH (ref 0.0–0.2)

## 2021-07-13 LAB — BASIC METABOLIC PANEL
Anion gap: 9 (ref 5–15)
Anion gap: 14 (ref 5–15)
Anion gap: 7 (ref 5–15)
BUN: 21 mg/dL — ABNORMAL HIGH (ref 6–20)
BUN: 22 mg/dL — ABNORMAL HIGH (ref 6–20)
BUN: 18 mg/dL (ref 6–20)
CO2: 35 mmol/L — ABNORMAL HIGH (ref 22–32)
CO2: 29 mmol/L (ref 22–32)
CO2: 38 mmol/L — ABNORMAL HIGH (ref 22–32)
Calcium: 9.6 mg/dL (ref 8.9–10.3)
Calcium: 9.6 mg/dL (ref 8.9–10.3)
Calcium: 9.3 mg/dL (ref 8.9–10.3)
Chloride: 97 mmol/L — ABNORMAL LOW (ref 98–111)
Chloride: 100 mmol/L (ref 98–111)
Chloride: 94 mmol/L — ABNORMAL LOW (ref 98–111)
Creatinine, Ser: 0.48 mg/dL (ref 0.44–1.00)
Creatinine, Ser: 0.66 mg/dL (ref 0.44–1.00)
Creatinine, Ser: 0.52 mg/dL (ref 0.44–1.00)
GFR, Estimated: >60 mL/min (ref >60)
GFR, Estimated: >60 mL/min (ref >60)
GFR, Estimated: >60 mL/min (ref >60)
Glucose, Bld: 97 mg/dL (ref 70–99)
Glucose, Bld: 83 mg/dL (ref 70–99)
Glucose, Bld: 103 mg/dL — ABNORMAL HIGH (ref 70–99)
Potassium: 6.6 mmol/L (ref 3.5–5.1)
Potassium: >7.5 mmol/L (ref 3.5–5.1)
Potassium: 4.2 mmol/L (ref 3.5–5.1)
Sodium: 141 mmol/L (ref 135–145)
Sodium: 143 mmol/L (ref 135–145)
Sodium: 139 mmol/L (ref 135–145)

## 2021-07-13 LAB — HEPATIC FUNCTION PANEL
ALT: 53 U/L — ABNORMAL HIGH (ref 0–44)
AST: 33 U/L (ref 15–41)
Albumin: 2.5 g/dL — ABNORMAL LOW (ref 3.5–5.0)
Alkaline Phosphatase: 75 U/L (ref 38–126)
Bilirubin, Direct: 0.1 mg/dL (ref 0.0–0.2)
Indirect Bilirubin: 0.5 mg/dL (ref 0.3–0.9)
Total Bilirubin: 0.6 mg/dL (ref 0.3–1.2)
Total Protein: 6.4 g/dL — ABNORMAL LOW (ref 6.5–8.1)

## 2021-07-13 LAB — GLUCOSE, CAPILLARY
Glucose-Capillary: 104 mg/dL — ABNORMAL HIGH (ref 70–99)
Glucose-Capillary: 142 mg/dL — ABNORMAL HIGH (ref 70–99)
Glucose-Capillary: 147 mg/dL — ABNORMAL HIGH (ref 70–99)
Glucose-Capillary: 254 mg/dL — ABNORMAL HIGH (ref 70–99)
Glucose-Capillary: 84 mg/dL (ref 70–99)

## 2021-07-13 LAB — MAGNESIUM: Magnesium: 1.9 mg/dL (ref 1.7–2.4)

## 2021-07-13 LAB — MRSA NEXT GEN BY PCR, NASAL: MRSA by PCR Next Gen: NOT DETECTED

## 2021-07-13 MED ORDER — CLONAZEPAM 0.5 MG PO TABS
0.5000 mg | ORAL_TABLET | Freq: Four times a day (QID) | ORAL | Status: DC
  Administered 2021-07-13 – 2021-07-17 (×16): 0.5 mg
  Filled 2021-07-13 (×16): qty 1

## 2021-07-13 MED ORDER — VANCOMYCIN HCL 1500 MG/300ML IV SOLN
1500.0000 mg | INTRAVENOUS | Status: DC
  Administered 2021-07-14: 1500 mg via INTRAVENOUS
  Filled 2021-07-13 (×2): qty 300

## 2021-07-13 MED ORDER — QUETIAPINE FUMARATE 100 MG PO TABS
100.0000 mg | ORAL_TABLET | Freq: Two times a day (BID) | ORAL | Status: DC
  Administered 2021-07-13 – 2021-07-24 (×24): 100 mg
  Filled 2021-07-13 (×24): qty 1

## 2021-07-13 MED ORDER — INSULIN DETEMIR 100 UNIT/ML ~~LOC~~ SOLN
10.0000 [IU] | Freq: Two times a day (BID) | SUBCUTANEOUS | Status: DC
  Administered 2021-07-13 (×2): 10 [IU] via SUBCUTANEOUS
  Filled 2021-07-13 (×4): qty 0.1

## 2021-07-13 MED ORDER — VANCOMYCIN HCL 2000 MG/400ML IV SOLN
2000.0000 mg | Freq: Once | INTRAVENOUS | Status: AC
  Administered 2021-07-13: 2000 mg via INTRAVENOUS
  Filled 2021-07-13: qty 400

## 2021-07-13 MED ORDER — INSULIN ASPART 100 UNIT/ML IJ SOLN
4.0000 [IU] | INTRAMUSCULAR | Status: DC
  Administered 2021-07-13 – 2021-07-14 (×3): 4 [IU] via SUBCUTANEOUS

## 2021-07-13 NOTE — Progress Notes (Signed)
Speech Language Pathology Treatment: [name] Speaking valve  ?Patient Details ?Name: Gina Bradley ?MRN: [id number] ?DOB: [birth year] ?Today's Date: 07/13/2021 ?Time: 5883-2549 ?SLP Time Calculation (min) (ACUTE ONLY): 13 min ? ?Assessment / Plan / Recommendation ?Clinical Impression ? Pt transferred out of ICU today.  She was restless, soft mitts in place.  She opened her eyes on command. Cuff was deflated at baseline - PMV placed for short intervals.  She was better able to achieve some intentional voicing today, repeated "1,2" when asked to count to ten and attempted to speak, but it was generally unintelligible.  Coughing persisted with valve in place.  VS remained stable.  There was intermittent back pressure through trach when valve was removed; however, pt was poorly positioned in bed and continued to move restlessly despite attempts to position in a way that would optimize voice. ? ?Recommend continued PMV with SLP only; will benefit from trach downsize when that is feasible  (trach was placed 14 days ago - hopefully now that she is off vent she can have trach changed at least to a cuffless).  ?SLP will follow. When she is more consistently alert and attentive, will evaluate swallow. ?  ?HPI HPI: 61 yo female former smoker presented to [facility] ER on 3/21 with AMS, fever.  Found to have multifocal pneumonia and required intubation and pressors.  Transferred to [facility] for further management. ETT 3/21; trach 3/28 secondary to failure to wean; TC trials 4/8. Dx acute metabolic encephalopathy, pmhx peripheral neuropathy, HF, HTN, DM2. ?  ?   ?SLP Plan ? Continue with current plan of care ? ?  ?  ?Recommendations for follow up therapy are one component of a multi-disciplinary discharge planning process, led by the attending physician.  Recommendations may be updated based on patient status, additional functional criteria and insurance authorization. ?  ? ?Recommendations  ?   ?   ? Patient may use Passy-Muir Speech  Valve: with SLP only  ?   ? ? ? ? Oral Care Recommendations: Oral care QID ?Follow Up Recommendations: Other (comment) (tba) ?Assistance recommended at discharge: Frequent or constant Supervision/Assistance ?SLP Visit Diagnosis: Aphonia (R49.1) ?Plan: Continue with current plan of care ? ? ? ? ?  ?  ?Ormond Lazo L. Mairead Schwarzkopf, MA CCC/SLP ?Acute Rehabilitation Services ?Office number [phone] ?Pager [phone] ? ? ?[name] ? ?07/13/2021, 4:52 PM ?

## 2021-07-13 NOTE — Progress Notes (Signed)
Pharmacy Antibiotic Note ? ?Gina Bradley is a 61 y.o. female admitted on 06/22/2021 with  AMS and fever, found to have a pneumonia . Patient was intubated and trach was put in for failure to wean off ventilator.  Blood cultures grew multiple staph species, PICC line removed 4/2 for line holiday given blood cultures. Patient has been off antibiotics since 4/4. Patient had been on and off fevering with positive metapneumovirus respiratory panel, which was thought to be the source of fevers. Tracheal aspirate now growing staph aureus. Pharmacy has been consulted for IV vancomycin dosing. ? ?WBC remains elevated at 22.3 this AM. Patient is currently afebrile, renal function appears to be at baseline. ? ?Plan: ?Vancomycin IV 2g x 1 ?Vancomycin IV 1500 mg q24h ?F/u MRSA nare PCR swab ?F/u tracheal aspirate culture susceptibilities and de-escalate antibiotics as able ?Monitor renal function and signs of clinical improvement ? ?Height: 5\' 5"  (165.1 cm) ?Weight: 100.3 kg (221 lb 1.9 oz) ?IBW/kg (Calculated) : 57 ? ?Temp (24hrs), Avg:99.3 ?F (37.4 ?C), Min:98.9 ?F (37.2 ?C), Max:99.9 ?F (37.7 ?C) ? ?Recent Labs  ?Lab 07/09/21 ?0158 07/10/21 ?[id number] 07/11/21 ?0310 07/12/21 ?0320 07/12/21 ?[id number] 07/13/21 ?0143 07/13/21 ?[id number] 07/13/21 ?[id number]  ?WBC 19.8* 18.4* 18.0*  --  22.2* 22.3*  --   --   ?CREATININE 0.55 0.45 0.53 0.50  --  0.48 0.66 0.52  ?  ?Estimated Creatinine Clearance: 87.7 mL/min (by C-G formula based on SCr of 0.52 mg/dL).   ? ?Allergies  ?Allergen Reactions  ? Bee Venom Other (See Comments)  ?  Unknown  ? Tdap [Tetanus-Diphth-Acell Pertussis] Other (See Comments)  ?  Unknown  ? ? ?Antimicrobials this admission: ?Doxycyline 3/22 >> 3/24 ?Ceftriaxone 3/21 >>3/25 ?Azithromycin 3/21 x1 dose ?Vancomycin 3/24 >>3/25; 4/1>> 4/4, 4/10 >> ?Zosyn 3/25 >> 3/28 ?Cefepime 3/31>> 4/4 ?Diflucan 4/1>> 4/4 ? ? ?Microbiology results: ?4/10 tracheal aspirate: staph aureus ?4/5 respiratory panel: metapneumo virus ?4/3 BCx - ngtd ?4/1  tracheal aspirate- negative ?3/31 Bcx -2/4 Staph epi, mecA ?3/27 tracheal aspirate- rare GPC - normal resp flora ?3/23 BCx -1/4 staph hominis, contaminant ?3/23 UCx >> 1K MSSA, contaminant ?3/22 TA negative ?3/21 legionella/strep pneumo - neg ? ?Thank you for involving pharmacy in this patient's care. ? ?Elita Quick, PharmD ?PGY1 Ambulatory Care Pharmacy Resident ?07/13/2021 8:40 AM ? ?**Pharmacist phone directory can be found on [email or web address] listed under [place]** ?

## 2021-07-13 NOTE — Progress Notes (Signed)
eLink Physician-Brief Progress Note ?Patient Name: Gina Bradley ?DOB: 1961/03/02 ?MRN: [id number] ? ? ?Date of Service ? 07/13/2021  ?HPI/Events of Note ? Repeat K+ = 4.2.   ?eICU Interventions ? Continue present management.   ? ? ? ?Intervention Category ?Major Interventions: Other: ? ?Gerber Penza Dennard Nip ?07/13/2021, 6:24 AM ?

## 2021-07-13 NOTE — Progress Notes (Signed)
?PROGRESS NOTE ? ? ? ?TROYLENE FRIGON  [id number] DOB: 13-Mar-1961 DOA: 06/22/2021 ?PCP: Redmond School, MD  ? ?Brief Narrative:  ?61 y/o female with CAD, smoker, heart failure, GERD, type 2 DM, hypertension, SVT, peripheral neuropathy, steroid dependent COPD, chronic pain history of DVT, chronic lymphedema, spinal stenosis, was recently diagnosed with compression deformity of the L4 vertebrae and subacute fracture of the right inferior pubic rami seen in the ED on 06/06/2021.  Patient was discharged and given orthopedic follow-up and had been prescribed hydrocodone and Robaxin.  She demonstrated that she was able to ambulate with no difficulty at that time and it was felt fracture had occurred likely 1 month prior to that ED visit.  She has been mostly immobile at home due to pain and discomfort and there was high suspicion for pulmonary embolus and venous thromboembolism. ? ?She presented to the ED by EMS today with altered mentation.  When she arrived she was in SVT with heart rate greater than 200.  The ED attempted cardioversion at 39 J with no success.  She was then treated with adenosine 6 mg followed by 12 mg and converted to sinus tachycardia.  Patient has been unable to give history.  She remained hypoxic and tachypneic with pulse ox of 80% on room air.  She was initially placed on a nonrebreather.   Patient also became severely agitated, remained hypoxic, and unable to receive necessary CT scanning therefore she was intubated due to high suspicion for PE in setting of immobility.   Central line was placed in the ED.  Intensivist was consulted in [place] and said that patient could remain at [facility], ICU. ? ?Patient was started on broad-spectrum antibiotic therapy via the sepsis protocols.  Initially elevated lactic acid at 6.0 improved to less than 1.5 after IV fluid hydration.  WBC was markedly elevated at 24. ? ?After arriving up to ICU bed she went back into SVT and given additional 6 mg of  adenosine and went back into sinus tachycardia.  Cardiology evaluated and started on IV amiodarone infusion for persistent SVT presentation.  BPs have been soft and may have to start on pressor support.   ? ?06/24/21:  received Lasix IV 40 mg at 0500, redosed IV lasix at 0930 for fluid overload; remains on vent with FiO2 up to 60%, PEEP 8, RR 24.  Tolerating enteral feeding.  Spike temp up to 101.1 ?PICC line placed--femoral line removed ? ?06/25/21:  remains on vent-Vt increased to 600 and RR to 34>>more synchronous.  fOx at 60%.  CVP 20 so lasix IV 40 bid and echo ordered.  Continues to have fever but off levophed since 10 PM 06/23/21. ? ?Further Significant Events: ? ?3/24 transfer to [facility] ?3/28 Trach for failure to wean ?3/31 Fever, hypotension, worsening leukocytosis. Started on levophed and antibiotics. ?4/2 PICC removed for line holiday; ID following ?4/4: off precedex;and fentanyl; ID plan to stop cefepime and vanc ?4/5: remains off precedex and fentanyl; failed PSV early in morning ?4/6: remains off continuous sedation; failed PSV this morning due to desaturation ?4/9: Tolerated being off the vent for about 8 hours yesterday 4/8 ? ?07/13/21: Transferred to [facility].   ? ?Assessment and Plan: ?* Acute respiratory failure with hypoxia and hypercarbia (HCC) ?--Pt was intubated in the ED due to severe agitation and hypoxia causing inability to get CT scan with high suspicion for PE ?--Secondary to pneumonia and now has an Aspiration PNA ?--3/23 Personally reviewed CXR--bibasilar opacities,+pulm edema ?--Appreciate PCCM ?--  Continue bronchodilators ?-CTA chest negative for PE, bilateral airspace opacities, subacute right and left rib fractures ?06/26/22>>CVP 20, lasix 40 mg IV bid started by PCCM but now stopped ?-Failed to Wean from the vent and is now s/p Tracheostomy 3/28 for failure to wean ?-Continue trach collar trials as long as she tolerates ?Continue chronic prednisone 10 mg once daily ?Continue Brovana,  Pulmicort, Yupelri ? ?Severe sepsis ([place]) ?Aspiration PNA ?Metapneumovirus Infection  ?-Secondary to pneumoia ?-Presented with fever, elevated lactate, and leukocytosis ?-Continue IV fluids>>saline lock as pt developed fluid overload ?-Was on Levophed 2 mcg/kg/min>>titrated off ?-Wean off Levophed as tolerated ?-Continue Solu-Cortef>>start wean ?-continued to spike Temps ?-repeat blood culture 3/23 neg ?-repeat UA no pyuria ?-WBC elevated and trended up to 22.3 and is now 19.5 ?-Resume Vanocmycin given most Recent Sputum Cx ? ?Multifocal pneumonia ?--S/p Ceftriaxone and Doxy and now back on Vanc ?-Follow tracheal aspirate cultures>>flora ?-Follow blood culture neg to date ?-Urine Streptococcus pneumoniae antigen negative ?-MRSA neg ?-PCT 9.28>>4.88>>1.90  ?-Aspiration PNA ? ?PSVT (paroxysmal supraventricular tachycardia) ([place]) ?Acute on Chronic Heart Failure with Preserved EF ?HTN  ?HLD ?--Cardioversion unsuccessful in ED ?--Treated with adenosine with conversion to sinus tachycardia ?--Pt has longstanding history of this and has been maintained on metoprolol  ?--Cardiology had been consulted ?-No longer on Amiodarone but Continue metoprolol, spironolactone, aspirin and statin ? ?Endometrial thickening on CT scan ?--Pt will need follow up with gynecology after discharge for an endometrial biopsy ? ?Transaminasemia ?-Secondary to hypotension and sepsis ?-Stable/trending down ?-RUQ US--neg ?-LFTs now improved and AST is 33 and ALT is 53  ? ?Obesity, Class III, BMI 40-49.9 (morbid obesity) ([place]) ?-Complicates overall prognosis and care ?-Estimated body mass index is 36.8 kg/m? as calculated from the following: ?  Height as of this encounter: 5\' 5"  (1.651 m). ?  Weight as of this encounter: 100.3 kg.  ?-Weight Loss and Dietary Counseling given ? ? ?Opioid dependence ([place]) ?-PDMP reviewed ?-Patient receives oxycodone 10 mg, #120 on a monthly basis ?-Patient receives hydrocodone 10/325, #120 on a monthly basis ?-UDS  positive for barbiturates, THC, benzodiazepines, opiates ?-C/w Oxy and was tarted back on Primadone  ? ?Acute metabolic encephalopathy ?-Initally suspected Secondary to sepsis and hypercarbia ?-UDS at presentation was positive for barbiturates/Benzo/opiate and cannabis ?-Peripheral neuropathy, chronic opiate use ?-Continue oxy, Seroquel and gabapentin ?-Restarted back on primidone ?-Decrease Klonopin to 0.5 mg every 6 hours and decrease Seroquel to 100 mg every 12 ?-Continues to be extremely agitated and may need Neuro involvement if not improving and repeat imaging  ?-Check EEG  ?-Recent MRI 3/31 and showed "No evidence of acute intracranial abnormality. Minimal chronic small-vessel ischemic changes within the cerebral white matter. Mild generalized parenchymal volume loss. Bilateral proptosis. Paranasal sinus disease, as described. Large bilateral middle ear/mastoid effusions." ? ? ?Chronic heart failure with preserved ejection fraction (HFpEF) ([place]) ?- Given a dose of IV Lasix in the emergency department ?-Appears clinically euvolemic at present ?-08/18/2020 echo EF 70-75%, G1DD, EF 70-75% ?-lasix IV 40 mg given 06/24/21 x 2 ?-06/25/21--lasix IV ?-Echo done ?-C/w Spironlactone ? ? ?Right subacute Inferior pubic ramus fracture ?--she was seen in ED on 3/5 and outpatient orthopedic follow up ?--pain management, ambulation, PT eval when medically ready ?-CT abdomen--subacute right sacral insufficiency fractures, healing right obturator ring fracture, healing L3-5 transverse process fracture, nondisplaced left pubic body fracture ? ?Uncontrolled type 2 diabetes mellitus with hyperglycemia, without long-term current use of insulin ([place]) ?-08/18/2020 hemoglobin A1c 7.3 ?-06/23/21 hemoglobin A1c--7.5 ?-Holding metformin ?-C/w SSI coverage  and CBG monitoring  ? ?GERD (gastroesophageal reflux disease) ?- Continue pantoprazole ? ?HTN (hypertension), benign ?- Was holding home dose metoprolol succinate secondary to hypotension  but resumed  ?-Holding losartan ?-Continue to Monitor BP per Protocol  ? ? ?DVT prophylaxis: Enoxaparin 50 mg sq q24h ? ?  Code Status: Full Code ?Family Communication: No family present at bedside ? ?Disposition

## 2021-07-13 NOTE — Progress Notes (Signed)
eLink Physician-Brief Progress Note ?Patient Name: Gina Bradley ?DOB: 29-Nov-1960 ?MRN: [id number] ? ? ?Date of Service ? 07/13/2021  ?HPI/Events of Note ? Hyperkalemia - K+ = 6.6. However, the specimen is hemolyzed.   ?eICU Interventions ? Plan: ?Repeat BMP STAT.   ? ? ? ?Intervention Category ?Major Interventions: Electrolyte abnormality - evaluation and management ? ?[name] ?07/13/2021, 2:49 AM ?

## 2021-07-13 NOTE — Evaluation (Signed)
Occupational Therapy Evaluation ?Patient Details ?Name: Gina Bradley ?MRN: [id number] ?DOB: Aug 22, 1960 ?Today's Date: 07/13/2021 ? ? ?History of Present Illness 61 yo female former smoker presented to [facility] ER on 3/21 with AMS, fever.  Found to have multifocal pneumonia and required intubation and pressors.  Transferred to [facility] 06/25/21 for treatment of acute on chronic hypoxic/hypercapnic respiratory failure from PNA, COPD with acute COPD, paroxysmal SVT, acute on chronic diastolic CHF, and acute metabolic encephalopathy in presence of peripheral neuropathy, back pain and chronic opiate use. PMH: CAD, smoker, heart failure, GERD, type 2 DM, hypertension, SVT, peripheral neuropathy, steroid dependent COPD, chronic pain history of DVT, chronic lymphedema, spinal stenosis, was recently diagnosed with compression deformity of the L4 vertebrae and subacute fracture of the right inferior pubic rami seen in the ED on 06/06/2021 after fall.  ? ?Clinical Impression ?  ?Patient admitted for above and presents with problem list below, including impaired cognition, impaired balance, decreased activity tolerance.  Total assist +2 to reposition in bed, bed positioned into chair. Pt with forward and L lateral lean preference and requires up to mod assist to maintain midline.  She is not following commands or engaging with therapist, very restless throughout session.  She is briefly able to open her eyes on command, but unclear if actually following commands or not. Overall total assist for self care at this time.  Completed UE exercises (AAROM).  Pt on trach collar during session, VSS.  Will follow acutely for ADL engagement and to decrease burden of care.   ?   ? ?Recommendations for follow up therapy are one component of a multi-disciplinary discharge planning process, led by the attending physician.  Recommendations may be updated based on patient status, additional functional criteria and insurance authorization.  ? ?Follow Up  Recommendations ? OT at Long-term acute care hospital  ?  ?Assistance Recommended at Discharge Frequent or constant Supervision/Assistance  ?Patient can return home with the following Two people to help with walking and/or transfers;Two people to help with bathing/dressing/bathroom;Assistance with feeding;Direct supervision/assist for medications management;Direct supervision/assist for financial management;Assist for transportation;Help with stairs or ramp for entrance;Assistance with cooking/housework ? ?  ?Functional Status Assessment ? Patient has had a recent decline in their functional status and demonstrates the ability to make significant improvements in function in a reasonable and predictable amount of time.  ?Equipment Recommendations ? Other (comment) (defer)  ?  ?Recommendations for Other Services   ? ? ?  ?Precautions / Restrictions Precautions ?Precautions: Fall ?Precaution Comments: hx of fall with L4 compression fx ?Restrictions ?Weight Bearing Restrictions: No  ? ?  ? ?Mobility Bed Mobility ?Overal bed mobility: Needs Assistance ?  ?  ?  ?  ?  ?  ?General bed mobility comments: repositioned in bed with total assist +2, chair position in bed ?  ? ?Transfers ?  ?  ?  ?  ?  ?  ?  ?  ?  ?  ?  ? ?  ?Balance   ?  ?  ?Sitting balance - Comments: unsupported sitting balance upright in bed with mod assist, L lateral and foward lean preference ?  ?  ?  ?  ?  ?  ?  ?  ?  ?  ?  ?  ?  ?  ?  ?   ? ?ADL either performed or assessed with clinical judgement  ? ?ADL Overall ADL's : Needs assistance/impaired ?  ?  ?  ?  ?  ?  ?  ?  ?  ?  ?  ?  ?  ?  ?  ?  ?  ?  ?  ?  General ADL Comments: total assist for all ADls at this time  ? ? ? ?Vision   ?Additional Comments: continue assessment  ?   ?Perception   ?  ?Praxis   ?  ? ?Pertinent Vitals/Pain Pain Assessment ?Pain Assessment: Faces ?Faces Pain Scale: No hurt  ? ? ? ?Hand Dominance   ?  ?Extremity/Trunk Assessment Upper Extremity Assessment ?Upper Extremity Assessment:  Difficult to assess due to impaired cognition (appears WFL, pt) ?  ?Lower Extremity Assessment ?Lower Extremity Assessment: Defer to PT evaluation ?  ?  ?  ?Communication Communication ?Communication: Tracheostomy ?  ?Cognition Arousal/Alertness: Awake/alert ?Behavior During Therapy: Restless ?Overall Cognitive Status: Difficult to assess ?  ?  ?  ?  ?  ?  ?  ?  ?  ?  ?  ?  ?  ?  ?  ?  ?General Comments: pt not following commands, not engaging with therapist. ?  ?  ?General Comments  on trach collar during session, 8L 35% ? ?  ?Exercises   ?  ?Shoulder Instructions    ? ? ?Home Living Family/patient expects to be discharged to:: Private residence ?Living Arrangements: Spouse/significant other ?Available Help at Discharge: Family;Available PRN/intermittently ?Type of Home: House ?Home Access: Stairs to enter ?Entrance Stairs-Number of Steps: 1 ?  ?Home Layout: One level ?  ?  ?Bathroom Shower/Tub: Tub/shower unit ?  ?Bathroom Toilet: Standard ?  ?  ?Home Equipment: Other (comment) (hemi walker) ?  ?Additional Comments: per chart review, pt unable to report ?  ? ?  ?Prior Functioning/Environment Prior Level of Function : Needs assist (per chart review, pt unable to report) ?  ?  ?  ?Physical Assist : Mobility (physical);ADLs (physical) ?Mobility (physical): Gait;Stairs ?ADLs (physical): IADLs ?Mobility Comments: husband reports difficulty with ambulation last 6-8 weeks including fall resulting in pelvic fracture ?ADLs Comments: husband reports pt performing bird baths, and requiring assist for dressing last 6 weeks ?  ? ?  ?  ?OT Problem List: Decreased strength;Decreased activity tolerance;Impaired balance (sitting and/or standing);Decreased coordination;Decreased cognition;Decreased safety awareness;Decreased knowledge of use of DME or AE;Decreased knowledge of precautions;Cardiopulmonary status limiting activity;Obesity ?  ?   ?OT Treatment/Interventions: Self-care/ADL training;Therapeutic exercise;DME and/or AE  instruction;Therapeutic activities;Cognitive remediation/compensation;Patient/family education;Balance training  ?  ?OT Goals(Current goals can be found in the care plan section) Acute Rehab OT Goals ?Patient Stated Goal: none stated ?OT Goal Formulation: Patient unable to participate in goal setting ?Time For Goal Achievement: 07/27/21 ?Potential to Achieve Goals: Good  ?OT Frequency: Min 2X/week ?  ? ?Co-evaluation   ?  ?  ?  ?  ? ?  ?AM-PAC OT "6 Clicks" Daily Activity     ?Outcome Measure Help from another person eating meals?: Total ?Help from another person taking care of personal grooming?: Total ?Help from another person toileting, which includes using toliet, bedpan, or urinal?: Total ?Help from another person bathing (including washing, rinsing, drying)?: Total ?Help from another person to put on and taking off regular upper body clothing?: Total ?Help from another person to put on and taking off regular lower body clothing?: Total ?6 Click Score: 6 ?  ?End of Session Nurse Communication: Mobility status;Precautions ? ?Activity Tolerance: Patient tolerated treatment well ?Patient left: in bed;with call bell/phone within reach;with bed alarm set;with restraints reapplied ? ?OT Visit Diagnosis: Other abnormalities of gait and mobility (R26.89);Muscle weakness (generalized) (M62.81);Other symptoms and signs involving cognitive function  ?              ?Time:  [id number] ?OT Time Calculation (min): 15 min ?Charges:  OT General Charges ?$OT Visit: 1 Visit ?OT Evaluation ?$OT Eval Moderate Complexity: 1 Mod ? ?Jolaine Artist, OT ?Acute Rehabilitation Services ?Pager [phone] ?Office [phone] ? ? ?Delight Stare ?07/13/2021, 1:26 PM ?

## 2021-07-14 ENCOUNTER — Inpatient Hospital Stay (HOSPITAL_COMMUNITY): Payer: 59

## 2021-07-14 DIAGNOSIS — J9601 Acute respiratory failure with hypoxia: Secondary | ICD-10-CM | POA: Diagnosis not present

## 2021-07-14 DIAGNOSIS — R9389 Abnormal findings on diagnostic imaging of other specified body structures: Secondary | ICD-10-CM | POA: Diagnosis not present

## 2021-07-14 DIAGNOSIS — G9341 Metabolic encephalopathy: Secondary | ICD-10-CM | POA: Diagnosis not present

## 2021-07-14 DIAGNOSIS — I5032 Chronic diastolic (congestive) heart failure: Secondary | ICD-10-CM | POA: Diagnosis not present

## 2021-07-14 LAB — GLUCOSE, CAPILLARY
Glucose-Capillary: 144 mg/dL — ABNORMAL HIGH (ref 70–99)
Glucose-Capillary: 153 mg/dL — ABNORMAL HIGH (ref 70–99)
Glucose-Capillary: 172 mg/dL — ABNORMAL HIGH (ref 70–99)
Glucose-Capillary: 200 mg/dL — ABNORMAL HIGH (ref 70–99)
Glucose-Capillary: 221 mg/dL — ABNORMAL HIGH (ref 70–99)
Glucose-Capillary: 233 mg/dL — ABNORMAL HIGH (ref 70–99)
Glucose-Capillary: 241 mg/dL — ABNORMAL HIGH (ref 70–99)
Glucose-Capillary: 64 mg/dL — ABNORMAL LOW (ref 70–99)
Glucose-Capillary: 66 mg/dL — ABNORMAL LOW (ref 70–99)

## 2021-07-14 LAB — CBC WITH DIFFERENTIAL/PLATELET
Abs Immature Granulocytes: 2.02 10*3/uL — ABNORMAL HIGH (ref 0.00–0.07)
Basophils Absolute: 0.3 10*3/uL — ABNORMAL HIGH (ref 0.0–0.1)
Basophils Relative: 1 %
Eosinophils Absolute: 0.3 10*3/uL (ref 0.0–0.5)
Eosinophils Relative: 2 %
HCT: 28.7 % — ABNORMAL LOW (ref 36.0–46.0)
Hemoglobin: 8.6 g/dL — ABNORMAL LOW (ref 12.0–15.0)
Immature Granulocytes: 11 %
Lymphocytes Relative: 16 %
Lymphs Abs: 3.1 10*3/uL (ref 0.7–4.0)
MCH: 30.5 pg (ref 26.0–34.0)
MCHC: 30 g/dL (ref 30.0–36.0)
MCV: 101.8 fL — ABNORMAL HIGH (ref 80.0–100.0)
Monocytes Absolute: 1.9 10*3/uL — ABNORMAL HIGH (ref 0.1–1.0)
Monocytes Relative: 10 %
Neutro Abs: 11.5 10*3/uL — ABNORMAL HIGH (ref 1.7–7.7)
Neutrophils Relative %: 60 %
Platelets: 389 10*3/uL (ref 150–400)
RBC: 2.82 MIL/uL — ABNORMAL LOW (ref 3.87–5.11)
RDW: 19.2 % — ABNORMAL HIGH (ref 11.5–15.5)
WBC: 19.1 10*3/uL — ABNORMAL HIGH (ref 4.0–10.5)
nRBC: 2.3 % — ABNORMAL HIGH (ref 0.0–0.2)

## 2021-07-14 LAB — COMPREHENSIVE METABOLIC PANEL
ALT: 48 U/L — ABNORMAL HIGH (ref 0–44)
AST: 36 U/L (ref 15–41)
Albumin: 2.4 g/dL — ABNORMAL LOW (ref 3.5–5.0)
Alkaline Phosphatase: 79 U/L (ref 38–126)
Anion gap: 9 (ref 5–15)
BUN: 20 mg/dL (ref 6–20)
CO2: 35 mmol/L — ABNORMAL HIGH (ref 22–32)
Calcium: 9 mg/dL (ref 8.9–10.3)
Chloride: 95 mmol/L — ABNORMAL LOW (ref 98–111)
Creatinine, Ser: 0.59 mg/dL (ref 0.44–1.00)
GFR, Estimated: >60 mL/min (ref >60)
Glucose, Bld: 148 mg/dL — ABNORMAL HIGH (ref 70–99)
Potassium: 4.2 mmol/L (ref 3.5–5.1)
Sodium: 139 mmol/L (ref 135–145)
Total Bilirubin: 0.8 mg/dL (ref 0.3–1.2)
Total Protein: 6.2 g/dL — ABNORMAL LOW (ref 6.5–8.1)

## 2021-07-14 LAB — CULTURE, RESPIRATORY W GRAM STAIN

## 2021-07-14 LAB — MAGNESIUM: Magnesium: 1.8 mg/dL (ref 1.7–2.4)

## 2021-07-14 LAB — PHOSPHORUS: Phosphorus: 4.1 mg/dL (ref 2.5–4.6)

## 2021-07-14 MED ORDER — GUAIFENESIN 100 MG/5ML PO LIQD
30.0000 mL | Freq: Four times a day (QID) | ORAL | Status: DC
  Filled 2021-07-14: qty 30

## 2021-07-14 MED ORDER — DEXTROSE 50 % IV SOLN
12.5000 g | INTRAVENOUS | Status: AC
  Administered 2021-07-14: 12.5 g via INTRAVENOUS
  Filled 2021-07-14: qty 50

## 2021-07-14 MED ORDER — GUAIFENESIN 100 MG/5ML PO LIQD
30.0000 mL | Freq: Four times a day (QID) | ORAL | Status: DC
  Administered 2021-07-14 – 2021-07-24 (×40): 30 mL
  Filled 2021-07-14 (×46): qty 30

## 2021-07-14 MED ORDER — ACETYLCYSTEINE 20 % IN SOLN
10.0000 mL | Freq: Two times a day (BID) | RESPIRATORY_TRACT | Status: DC
  Administered 2021-07-14 (×2): 4 mL via RESPIRATORY_TRACT
  Administered 2021-07-15 – 2021-07-16 (×3): 10 mL via RESPIRATORY_TRACT
  Filled 2021-07-14 (×7): qty 12

## 2021-07-14 MED ORDER — INSULIN ASPART 100 UNIT/ML IJ SOLN
0.0000 [IU] | INTRAMUSCULAR | Status: DC
  Administered 2021-07-14: 2 [IU] via SUBCUTANEOUS
  Administered 2021-07-14 (×3): 3 [IU] via SUBCUTANEOUS
  Administered 2021-07-14 – 2021-07-15 (×2): 2 [IU] via SUBCUTANEOUS
  Administered 2021-07-15 (×2): 3 [IU] via SUBCUTANEOUS
  Administered 2021-07-15: 2 [IU] via SUBCUTANEOUS
  Administered 2021-07-15: 5 [IU] via SUBCUTANEOUS
  Administered 2021-07-16: 2 [IU] via SUBCUTANEOUS
  Administered 2021-07-16: 5 [IU] via SUBCUTANEOUS
  Administered 2021-07-16 (×2): 2 [IU] via SUBCUTANEOUS
  Administered 2021-07-16: 5 [IU] via SUBCUTANEOUS
  Administered 2021-07-16: 3 [IU] via SUBCUTANEOUS
  Administered 2021-07-17: 5 [IU] via SUBCUTANEOUS
  Administered 2021-07-17: 9 [IU] via SUBCUTANEOUS
  Administered 2021-07-17: 2 [IU] via SUBCUTANEOUS
  Administered 2021-07-17: 3 [IU] via SUBCUTANEOUS
  Administered 2021-07-17: 5 [IU] via SUBCUTANEOUS
  Administered 2021-07-17 – 2021-07-18 (×3): 3 [IU] via SUBCUTANEOUS
  Administered 2021-07-18: 9 [IU] via SUBCUTANEOUS
  Administered 2021-07-18 – 2021-07-19 (×3): 5 [IU] via SUBCUTANEOUS
  Administered 2021-07-19: 10 [IU] via SUBCUTANEOUS
  Administered 2021-07-19: 3 [IU] via SUBCUTANEOUS
  Administered 2021-07-19 (×2): 2 [IU] via SUBCUTANEOUS
  Administered 2021-07-20: 3 [IU] via SUBCUTANEOUS
  Administered 2021-07-20: 2 [IU] via SUBCUTANEOUS
  Administered 2021-07-20: 5 [IU] via SUBCUTANEOUS
  Administered 2021-07-20: 2 [IU] via SUBCUTANEOUS
  Administered 2021-07-20 (×2): 3 [IU] via SUBCUTANEOUS
  Administered 2021-07-21: 5 [IU] via SUBCUTANEOUS
  Administered 2021-07-21: 3 [IU] via SUBCUTANEOUS
  Administered 2021-07-21: 1 [IU] via SUBCUTANEOUS
  Administered 2021-07-21: 2 [IU] via SUBCUTANEOUS
  Administered 2021-07-21 – 2021-07-22 (×3): 3 [IU] via SUBCUTANEOUS
  Administered 2021-07-22: 2 [IU] via SUBCUTANEOUS
  Administered 2021-07-22: 1 [IU] via SUBCUTANEOUS
  Administered 2021-07-22 (×2): 2 [IU] via SUBCUTANEOUS

## 2021-07-14 MED ORDER — SODIUM CHLORIDE 3 % IN NEBU
4.0000 mL | INHALATION_SOLUTION | Freq: Two times a day (BID) | RESPIRATORY_TRACT | Status: AC
  Administered 2021-07-14 – 2021-07-16 (×6): 4 mL via RESPIRATORY_TRACT
  Filled 2021-07-14 (×7): qty 4

## 2021-07-14 NOTE — Progress Notes (Signed)
EEG complete - results pending 

## 2021-07-14 NOTE — Care Management Important Message (Signed)
Important Message ? ?Patient Details  ?Name: Gina Bradley ?MRN: [id number] ?Date of Birth: May 02, 1960 ? ? ?Medicare Important Message Given:  Yes ? ? ? ? ?Basha Krygier ?07/14/2021, 3:15 PM ?

## 2021-07-14 NOTE — Procedures (Signed)
Patient Name: Gina Bradley  ?MRN: [id number]  ?Epilepsy Attending: Lora Havens  ?Referring Physician/Provider: Kerney Elbe, DO ?Date: 07/14/2021 ?Duration: 28.17 mins ? ?Patient history: 61yo F with ams. EEG to evaluate for seizure ? ?Level of alertness: Awake, asleep ? ?AEDs during EEG study: Clonazepam, GBP ? ?Technical aspects: This EEG study was done with scalp electrodes positioned according to the 10-20 International system of electrode placement. Electrical activity was acquired at a sampling rate of 500Hz  and reviewed with a high frequency filter of 70Hz  and a low frequency filter of 1Hz . EEG data were recorded continuously and digitally stored.  ? ?Description: The posterior dominant rhythm consists of 9 Hz activity of moderate voltage (25-35 uV) seen predominantly in posterior head regions, symmetric and reactive to eye opening and eye closing. Sleep was characterized by vertex waves, sleep spindles (12 to 14 Hz), maximal frontocentral region. EEG showed intermittent generalized 5 to 6 Hz theta slowing. Hyperventilation and photic stimulation were not performed.    ? ?ABNORMALITY ?- Intermittent slow, generalized ? ?IMPRESSION: ?This study is suggestive of mild to moderate diffuse encephalopathy, nonspecific etiology. No seizures or epileptiform discharges were seen throughout the recording. ? ?Lora Havens  ? ?

## 2021-07-14 NOTE — Progress Notes (Signed)
?      ?                 PROGRESS NOTE ? ?      ?PATIENT DETAILS ?Name: Gina Bradley ?Age: 61 y.o. ?Sex: female ?Date of Birth: 1960-10-14 ?Admit Date: 06/22/2021 ?Admitting Physician Lilyan Gilford, DO ?RFF:MBWGY, Lyman Bishop, MD ? ?Brief Summary: ?Patient is a 61 y.o.  female with history of DM-2, HTN, SVT, HFpEF, steroid-dependent COPD, opiate dependence, recent compression fracture at L4/right inferior pubic rami fracture-who presented to [facility] ED with altered mental status, SOB with hypoxia, SVT-she was thought to have septic shock due to PNA-she was subsequently intubated-started on broad-spectrum antibiotics/pressors and managed in the ICU.  She was subsequently transferred to [facility] ICU-she was stabilized-however she had failure to wean and underwent a tracheostomy on 3/28.  While in the ICU-she developed fever requiring ID consultation-ICU delirium/encephalopathy requiring on and off infusion of Precedex.  After stability-she was transferred to [facility] on 4/11.  See below for further details. ? ?Significant events: ?3/21>> Admit to [facility], intubated, start pressors, cardiology consult for SVT, start amiodarone ?3/24>> transfer to [facility] ?3/28>> Trach for failure to wean ?4/11>> transfer to [facility] ? ?Significant studies: ?3/21>> CT head: No acute abnormalities. ?3/21>> CTA chest: No PE, multifocal pneumonia.  Multiple subacute/chronic rib fractures. ?3/21>> CT abdomen/pelvis: No acute finding-thickened endometrium. ?3/23>> RUQ ultrasound: No gallstones ?3/25>> Echo: EF 65-70% ?3/31>> MRI brain: No acute intracranial abnormality. ?4/1>> CT chest: Extensive multifocal patchy airspace consolidations. ? ?Significant microbiology data: ?3/21>> COVID/influenza PCR: Negative ?3/21>> blood culture: No growth ?3/21>> urine culture: No growth ?3/22>> tracheal aspirate: No growth ?3/23>> urine culture: 1000 colonies/mL of Staph aureus ?3/23>> blood culture: Staph hominis (contamination) ?3/27>> tracheal aspirate: No growth ?3/31>>  blood culture 1/2: Staph epidermidis (contamination) ?4/1>> tracheal aspirate: No growth ?4/3>> blood culture: No growth ?4/5>> respiratory virus panel: Meta pneumonia virus ?4/10>> tracheal aspirate: Staph aureus ? ?Procedures: ?3/28>> tracheostomy ? ?Consults: ?ID, PCCM, cardiology ? ?Subjective: ?Confused-opens eyes but really not following command. ? ?Objective: ?Vitals: ?Blood pressure 119/76, pulse (!) 109, temperature 98.8 ?F (37.1 ?C), temperature source Axillary, resp. rate 20, height 5\' 5"  (1.651 m), weight 100.3 kg, last menstrual period 01/05/2015, SpO2 90 %.  ? ?Exam: ?Gen Exam: Not in any distress. ?HEENT:atraumatic, normocephalic ?Chest: B/L clear to auscultation anteriorly ?CVS:S1S2 regular ?Abdomen:soft non tender, non distended ?Extremities:no edema ?Neurology: Non focal ?Skin: no rash ? ?Pertinent Labs/Radiology: ? ?  Latest Ref Rng & Units 07/14/2021  ?  1:08 AM 07/13/2021  ?  9:23 AM 07/13/2021  ?  1:43 AM  ?CBC  ?WBC 4.0 - 10.5 K/uL 19.1   19.5   22.3    ?Hemoglobin 12.0 - 15.0 g/dL 8.6   8.7   8.7    ?Hematocrit 36.0 - 46.0 % 28.7   27.7   29.4    ?Platelets 150 - 400 K/uL 389   338   359    ?  ?Lab Results  ?Component Value Date  ? NA 139 07/14/2021  ? K 4.2 07/14/2021  ? CL 95 (L) 07/14/2021  ? CO2 35 (H) 07/14/2021  ?  ? ? ?Assessment/Plan: ?Septic shock: Sepsis physiology has improved-due to PNA-required pressors while in the ICU-currently on IV vancomycin.   ? ?Acute on chronic hypoxic/hypercapnic respiratory failure due to multifocal pneumonia: Thought to be due to aspiration pneumonia/+/- metapneumovirus pneumonia.  Had completed a course of antimicrobial therapy-however sputum cultures on 4/10 positive for Staph aureus-back on IV vancomycin.  Continue supportive care-has a trach in place with thick secretions-needs aggressive pulmonary toileting.  Start Mucomyst nebs/hypertonic saline nebs-vibrator vest.  Watch closely. ? ?Acute metabolic encephalopathy: Continues to be confused-probably  multifactorial etiology in the setting of septic shock/hypoxia/prolonged ICU delirium-continue Seroquel, primidone and as needed Klonopin.  Await EEG.  We will continue to follow and optimize these medications accordingly. ? ?Dysphagia: Due to severe encephalopathy/recent trach-cortak tube in place.  Will need to discuss with family regarding further options including PEG tube. ? ?Transaminitis: Unclear significance-could have been from sepsis-improved-RUQ ultrasound was without acute abnormalities. ? ?COPD: Continue bronchodilators-on chronic prednisone. ? ?ZOX:[id number]:unsuccessful cardioversion on initial presentation to the ED-subsequently required amiodarone infusion-currently maintaining sinus rhythm-on beta ? ?Acute on chronic HFpEF: Volume status stable-continue Aldactone ? ?HTN: BP stable-continue metoprolol. ? ?HLD: Continue statin ? ?DM-2 (A1c 7.5 on 3/22): Hypoglycemic episode this morning-long-acting insulin discontinued-continue SSI. ? ?Recent Labs  ?  07/14/21 ?[id number] 07/14/21 ?[id number] 07/14/21 ?[id number]  ?GLUCAP 66* 172* 200*  ?  ? ?GERD: Continue PPI ? ?Normocytic anemia: Due to acute illness-no evidence of blood loss-transfuse if Hb <7 ? ?Endometrial thickening on CT abdomen: Will require outpatient follow-up with GYN. ? ?Opiate dependence: Continue oxycodone-watch closely-if mentation continues to be a problem-May need to slowly titrate down dose further. ? ?Recent right pubic rami fracture: Supportive care at this point. ? ?Nutrition Status: ?Nutrition Problem: Inadequate oral intake ?Etiology: inability to eat ?Signs/Symptoms: NPO status ?Interventions: Tube feeding, Prostat ? ?Obesity ?Estimated body mass index is 36.8 kg/m? as calculated from the following: ?  Height as of this encounter: 5\' 5"  (1.651 m). ?  Weight as of this encounter: 100.3 kg.  ? ?Code status: ?  Code Status: Full Code  ? ?DVT Prophylaxis: Prophylactic Lovenox ? ? ?Family Communication: Spouse Anthony-[phone]-called-no  response ? ?Disposition Plan: ?Status is: Inpatient ?Remains inpatient appropriate because: Septic shock-hypoxia-dysphagia on NG tube feeding-severely encephalopathic-not stable for discharge.  Per social work-[facility] transfer denied by insurance several days back. ?  ?Planned Discharge Destination: To be determined ? ? ?Diet: ?Diet Order   ? ?       ?  Diet NPO time specified  Diet effective now       ?  ? ?  ?  ? ?  ?  ? ? ?Antimicrobial agents: ?Anti-infectives (From admission, onward)  ? ? Start     Dose/Rate Route Frequency Ordered Stop  ? 07/14/21 1200  vancomycin (VANCOREADY) IVPB 1500 mg/300 mL       ? 1,500 mg ?150 mL/hr over 120 Minutes Intravenous Every 24 hours 07/13/21 0845    ? 07/13/21 0915  vancomycin (VANCOREADY) IVPB 2000 mg/400 mL       ? 2,000 mg ?200 mL/hr over 120 Minutes Intravenous  Once 07/13/21 0829 07/13/21 1053  ? 07/05/21 1015  fluconazole (DIFLUCAN) tablet 200 mg  Status:  Discontinued       ? 200 mg Oral Daily 07/05/21 0916 07/05/21 0917  ? 07/05/21 1015  fluconazole (DIFLUCAN) tablet 200 mg  Status:  Discontinued       ? 200 mg Per Tube Daily 07/05/21 0917 07/06/21 0924  ? 07/04/21 1800  vancomycin (VANCOREADY) IVPB 1500 mg/300 mL  Status:  Discontinued       ? 1,500 mg ?150 mL/hr over 120 Minutes Intravenous Every 24 hours 07/03/21 1638 07/06/21 0924  ? 07/03/21 1800  vancomycin (VANCOREADY) IVPB 2000 mg/400 mL       ? 2,000 mg ?200 mL/hr over 120 Minutes Intravenous  Once  07/03/21 1638 07/03/21 1922  ? 07/03/21 1045  fluconazole (DIFLUCAN) tablet 150 mg  Status:  Discontinued       ? 150 mg Oral Daily 07/03/21 0955 07/04/21 0828  ? 07/02/21 1645  ceFEPIme (MAXIPIME) 2 g in sodium chloride 0.9 % 100 mL IVPB  Status:  Discontinued       ? 2 g ?200 mL/hr over 30 Minutes Intravenous Every 8 hours 07/02/21 1556 07/06/21 0924  ? 06/26/21 1800  vancomycin (VANCOREADY) IVPB 1500 mg/300 mL  Status:  Discontinued       ? 1,500 mg ?150 mL/hr over 120 Minutes Intravenous Every 24 hours 06/25/21  1700 06/26/21 0831  ? 06/26/21 1500  piperacillin-tazobactam (ZOSYN) IVPB 3.375 g  Status:  Discontinued       ? 3.375 g ?12.5 mL/hr over 240 Minutes Intravenous Every 8 hours 06/26/21 1400 06/29/21 0955  ? 06/26/21 0300

## 2021-07-15 DIAGNOSIS — J9601 Acute respiratory failure with hypoxia: Secondary | ICD-10-CM | POA: Diagnosis not present

## 2021-07-15 DIAGNOSIS — I5032 Chronic diastolic (congestive) heart failure: Secondary | ICD-10-CM | POA: Diagnosis not present

## 2021-07-15 DIAGNOSIS — R9389 Abnormal findings on diagnostic imaging of other specified body structures: Secondary | ICD-10-CM | POA: Diagnosis not present

## 2021-07-15 DIAGNOSIS — G9341 Metabolic encephalopathy: Secondary | ICD-10-CM | POA: Diagnosis not present

## 2021-07-15 LAB — CBC WITH DIFFERENTIAL/PLATELET
Abs Immature Granulocytes: 2.12 10*3/uL — ABNORMAL HIGH (ref 0.00–0.07)
Basophils Absolute: 0.2 10*3/uL — ABNORMAL HIGH (ref 0.0–0.1)
Basophils Relative: 1 %
Eosinophils Absolute: 0.3 10*3/uL (ref 0.0–0.5)
Eosinophils Relative: 2 %
HCT: 27.7 % — ABNORMAL LOW (ref 36.0–46.0)
Hemoglobin: 8.5 g/dL — ABNORMAL LOW (ref 12.0–15.0)
Immature Granulocytes: 10 %
Lymphocytes Relative: 19 %
Lymphs Abs: 3.8 10*3/uL (ref 0.7–4.0)
MCH: 31.3 pg (ref 26.0–34.0)
MCHC: 30.7 g/dL (ref 30.0–36.0)
MCV: 101.8 fL — ABNORMAL HIGH (ref 80.0–100.0)
Monocytes Absolute: 2.2 10*3/uL — ABNORMAL HIGH (ref 0.1–1.0)
Monocytes Relative: 11 %
Neutro Abs: 11.7 10*3/uL — ABNORMAL HIGH (ref 1.7–7.7)
Neutrophils Relative %: 57 %
Platelets: 421 10*3/uL — ABNORMAL HIGH (ref 150–400)
RBC: 2.72 MIL/uL — ABNORMAL LOW (ref 3.87–5.11)
RDW: 19.5 % — ABNORMAL HIGH (ref 11.5–15.5)
WBC: 20.3 10*3/uL — ABNORMAL HIGH (ref 4.0–10.5)
nRBC: 1.7 % — ABNORMAL HIGH (ref 0.0–0.2)

## 2021-07-15 LAB — BASIC METABOLIC PANEL
Anion gap: 10 (ref 5–15)
BUN: 19 mg/dL (ref 6–20)
CO2: 31 mmol/L (ref 22–32)
Calcium: 8.7 mg/dL — ABNORMAL LOW (ref 8.9–10.3)
Chloride: 97 mmol/L — ABNORMAL LOW (ref 98–111)
Creatinine, Ser: 0.52 mg/dL (ref 0.44–1.00)
GFR, Estimated: >60 mL/min (ref >60)
Glucose, Bld: 155 mg/dL — ABNORMAL HIGH (ref 70–99)
Potassium: 4.4 mmol/L (ref 3.5–5.1)
Sodium: 138 mmol/L (ref 135–145)

## 2021-07-15 LAB — GLUCOSE, CAPILLARY
Glucose-Capillary: 172 mg/dL — ABNORMAL HIGH (ref 70–99)
Glucose-Capillary: 176 mg/dL — ABNORMAL HIGH (ref 70–99)
Glucose-Capillary: 198 mg/dL — ABNORMAL HIGH (ref 70–99)
Glucose-Capillary: 201 mg/dL — ABNORMAL HIGH (ref 70–99)
Glucose-Capillary: 243 mg/dL — ABNORMAL HIGH (ref 70–99)
Glucose-Capillary: 266 mg/dL — ABNORMAL HIGH (ref 70–99)

## 2021-07-15 MED ORDER — ORAL CARE MOUTH RINSE
15.0000 mL | Freq: Two times a day (BID) | OROMUCOSAL | Status: DC
  Administered 2021-07-15 – 2021-08-04 (×35): 15 mL via OROMUCOSAL

## 2021-07-15 MED ORDER — OXYCODONE HCL 5 MG PO TABS
10.0000 mg | ORAL_TABLET | ORAL | Status: DC
  Administered 2021-07-15 – 2021-07-17 (×11): 10 mg
  Filled 2021-07-15 (×11): qty 2

## 2021-07-15 MED ORDER — GABAPENTIN 250 MG/5ML PO SOLN
300.0000 mg | Freq: Three times a day (TID) | ORAL | Status: DC
  Administered 2021-07-15 – 2021-07-24 (×27): 300 mg
  Filled 2021-07-15 (×33): qty 6

## 2021-07-15 MED ORDER — CEFAZOLIN SODIUM-DEXTROSE 2-4 GM/100ML-% IV SOLN
2.0000 g | Freq: Three times a day (TID) | INTRAVENOUS | Status: AC
  Administered 2021-07-15 – 2021-07-22 (×21): 2 g via INTRAVENOUS
  Filled 2021-07-15 (×21): qty 100

## 2021-07-15 MED ORDER — CHLORHEXIDINE GLUCONATE 0.12 % MT SOLN
15.0000 mL | Freq: Two times a day (BID) | OROMUCOSAL | Status: DC
  Administered 2021-07-15 – 2021-08-04 (×39): 15 mL via OROMUCOSAL
  Filled 2021-07-15 (×41): qty 15

## 2021-07-15 NOTE — Progress Notes (Signed)
Changed patient's H2O bottle on trach collar. ?

## 2021-07-15 NOTE — Plan of Care (Signed)
?  Problem: Clinical Measurements: Goal: Ability to maintain clinical measurements within normal limits will improve Outcome: Progressing Goal: Will remain free from infection Outcome: Progressing Goal: Diagnostic test results will improve Outcome: Progressing Goal: Respiratory complications will improve Outcome: Progressing Goal: Cardiovascular complication will be avoided Outcome: Progressing   Problem: Nutrition: Goal: Adequate nutrition will be maintained Outcome: Progressing   Problem: Elimination: Goal: Will not experience complications related to bowel motility Outcome: Progressing Goal: Will not experience complications related to urinary retention Outcome: Progressing   Problem: Pain Managment: Goal: General experience of comfort will improve Outcome: Progressing   Problem: Safety: Goal: Ability to remain free from injury will improve Outcome: Progressing   

## 2021-07-15 NOTE — Progress Notes (Signed)
Pharmacy Antibiotic Note ? ?Gina Bradley is a 61 y.o. female admitted on 06/22/2021 with  AMS and fever, found to have a pneumonia . Patient was intubated and trach was put in for failure to wean off ventilator.  Blood cultures grew multiple staph species, PICC line removed 4/2 for line holiday given blood cultures. Patient had been off antibiotics since 4/4. Patient had been on and off fevering with positive metapneumovirus respiratory panel, which was thought to be the source of fevers. Tracheal aspirate grew Staph aureus. Pharmacy consulted on 4/11 for IV vancomycin dosing. ? ?Trach aspirate culture sensitivities updated 4/12 PM as methicillin sensitive staph aureus (MSSA).  Dr. Jerral Ralph ordered to narrow antibiotic therapy to Cefazolin.  Normal renal function with SCr 0.52 ?Afebrile.  WBC elevated 20.3K.  ? ?Plan: ?Discontinue Vancomycin  ?Start Cefazolin 2g IV every 8 hours.  ?Pharmacy will sign off. ? ?Height: 5\' 5"  (165.1 cm) ?Weight: 97.6 kg (215 lb 2.7 oz) ?IBW/kg (Calculated) : 57 ? ?Temp (24hrs), Avg:98.3 ?F (36.8 ?C), Min:97.3 ?F (36.3 ?C), Max:99 ?F (37.2 ?C) ? ?Recent Labs  ?Lab 07/12/21 ?0806 07/13/21 ?0143 07/13/21 ?09/12/21 07/13/21 ?09/12/21 07/13/21 ?09/12/21 07/14/21 ?0108 07/15/21 ?0122  ?WBC 22.2* 22.3*  --   --  19.5* 19.1* 20.3*  ?CREATININE  --  0.48 0.66 0.52  --  0.59 0.52  ? ?  ?Estimated Creatinine Clearance: 86.4 mL/min (by C-G formula based on SCr of 0.52 mg/dL).   ? ?Allergies  ?Allergen Reactions  ? Bee Venom Other (See Comments)  ?  Unknown  ? Tdap [Tetanus-Diphth-Acell Pertussis] Other (See Comments)  ?  Unknown  ? ? ?Antimicrobials this admission: ?Doxycyline 3/22 >> 3/24 ?Ceftriaxone 3/21 >>3/25 ?Azithromycin 3/21 x1 dose ?Vancomycin 3/24 >>3/25; 4/1>> 4/4, 4/10 >>4/13 ?Zosyn 3/25 >> 3/28 ?Cefepime 3/31>> 4/4 ?Diflucan 4/1>> 4/4 ?Cefazolin 4/13>> ? ? ?Microbiology results: ?4/10 tracheal aspirate: MSSA ?4/5 respiratory panel: metapneumo virus ?4/3 BCx negative ?4/1 tracheal aspirate-  negative ?3/31 Bcx -2/4 Staph epi, mecA ?3/27 tracheal aspirate- rare GPC - normal resp flora/ final ?3/23 BCx -1/4 staph hominis, contaminant ?3/23 UCx >> 1K MSSA, contaminant ?3/22 TA negative ?3/21 legionella/strep pneumo - neg ? ?Thank you for involving pharmacy in this patient's care. ? ?4/21, RPh ?Clinical Pharmacist ?[phone] ?07/15/2021 11:42 AM ? ?**Pharmacist phone directory can be found on [email or web address] listed under [facility]** ?

## 2021-07-15 NOTE — Progress Notes (Signed)
?      ?                 PROGRESS NOTE ? ?      ?PATIENT DETAILS ?Name: Gina Bradley ?Age: 61 y.o. ?Sex: female ?Date of Birth: May 08, 1960 ?Admit Date: 06/22/2021 ?Admitting Physician Rolla Plate, DO ?[id number], Purcell Nails, MD ? ?Brief Summary: ?Patient is a 61 y.o.  female with history of DM-2, HTN, SVT, HFpEF, steroid-dependent COPD, opiate dependence, recent compression fracture at L4/right inferior pubic rami fracture-who presented to [facility] ED with altered mental status, SOB with hypoxia, SVT-she was thought to have septic shock due to PNA-she was subsequently intubated-started on broad-spectrum antibiotics/pressors and managed in the ICU.  She was subsequently transferred to [facility] ICU-she was stabilized-however she had failure to wean and underwent a tracheostomy on 3/28.  While in the ICU-she developed fever requiring ID consultation-ICU delirium/encephalopathy requiring on and off infusion of Precedex.  After stability-she was transferred to [facility] on 4/11.  See below for further details. ? ?Significant events: ?3/21>> Admit to [facility], intubated, start pressors, cardiology consult for SVT, start amiodarone ?3/24>> transfer to [facility] ?3/28>> Trach for failure to wean ?4/11>> transfer to [facility] ? ?Significant studies: ?3/21>> CT head: No acute abnormalities. ?3/21>> CTA chest: No PE, multifocal pneumonia.  Multiple subacute/chronic rib fractures. ?3/21>> CT abdomen/pelvis: No acute finding-thickened endometrium. ?3/23>> RUQ ultrasound: No gallstones ?3/25>> Echo: EF 65-70% ?3/31>> MRI brain: No acute intracranial abnormality. ?4/1>> CT chest: Extensive multifocal patchy airspace consolidations. ?4/12>> EEG: No seizures. ? ?Significant microbiology data: ?3/21>> COVID/influenza PCR: Negative ?3/21>> blood culture: No growth ?3/21>> urine culture: No growth ?3/22>> tracheal aspirate: No growth ?3/23>> urine culture: 1000 colonies/mL of Staph aureus ?3/23>> blood culture: Staph hominis (contamination) ?3/27>> tracheal  aspirate: No growth ?3/31>> blood culture 1/2: Staph epidermidis (contamination) ?4/1>> tracheal aspirate: No growth ?4/3>> blood culture: No growth ?4/5>> respiratory virus panel: Meta pneumonia virus ?4/10>> tracheal aspirate: MSSA ? ?Procedures: ?3/28>> tracheostomy ? ?Consults: ?ID, PCCM, cardiology ? ?Subjective: ?A bit better today-she was able to open eyes when I called out her name.  She was able to lift both her arms on asked her to.  Still appears very restless.  Per nursing staff-no major events overnight. ? ?Objective: ?Vitals: ?Blood pressure 91/75, pulse (!) 106, temperature 98.4 ?F (36.9 ?C), temperature source Axillary, resp. rate 20, height 5\' 5"  (1.651 m), weight 97.6 kg, last menstrual period 01/05/2015, SpO2 94 %.  ? ?Exam: ?Gen Exam: Restless-not in any distress. ?HEENT:atraumatic, normocephalic ?Chest: B/L clear to auscultation anteriorly ?CVS:S1S2 regular ?Abdomen:soft non tender, non distended ?Extremities: Bruising/ecchymosis in lower extremities.  Some trace edema. ?Neurology: Difficult exam but appears nonfocal. ?Skin: no rash  ? ?Pertinent Labs/Radiology: ? ?  Latest Ref Rng & Units 07/15/2021  ?  1:22 AM 07/14/2021  ?  1:08 AM 07/13/2021  ?  9:23 AM  ?CBC  ?WBC 4.0 - 10.5 K/uL 20.3   19.1   19.5    ?Hemoglobin 12.0 - 15.0 g/dL 8.5   8.6   8.7    ?Hematocrit 36.0 - 46.0 % 27.7   28.7   27.7    ?Platelets 150 - 400 K/uL 421   389   338    ?  ?Lab Results  ?Component Value Date  ? NA 138 07/15/2021  ? K 4.4 07/15/2021  ? CL 97 (L) 07/15/2021  ? CO2 31 07/15/2021  ? ?  ? ?Assessment/Plan: ?Septic shock: SIRS physiology has improved-although still has significant leukocytosis.  Cultures  as above-given MSSA in sputum culture on 4/10-have switched to Ancef. ? ?Acute on chronic hypoxic/hypercapnic respiratory failure due to multifocal pneumonia: Thought to be due to aspiration pneumonia/+/- metapneumovirus pneumonia.  Had completed a course of antimicrobial therapy while in the ICU-however sputum  cultures on 4/10 positive for MSSA-now on Ancef and no longer on vancomycin.  Continues to have issues with thick mucus secretions-continue aggressive pulmonary toileting-remains on Mucomyst/hypertonic saline nebs-bronchodilators-vibrator vest.  Repeat CXR periodically. ? ?Acute metabolic encephalopathy: Likely multifactorial etiology-due to PNA/ICU delirium/sepsis/polypharmacy.  Some mild improvement but still very restless-still confused-continue current dosing of Seroquel/primidone/Klonopin-have decreased dosage of oxycodone/Neurontin for now.  Continue supportive care-and slowly attempt to titrate medications over the next few days.  If mentation does not improve-May need neurology evaluation at some point in time.   ? ?Dysphagia: Due to severe encephalopathy/recent trach-cortak tube in place.  Since patient continues to have severe encephalopathy-suspect we need to consider placing a PEG tube at some point-tried calling patient's spouse again-unsuccessful-subsequently spoke with patient's daughter-she will try to get in touch with the patient's spouse and have him come to the hospital for further ongoing discussion.   ? ?Transaminitis: Unclear significance-could have been from sepsis-improved-RUQ ultrasound was without acute abnormalities. ? ?COPD: Continue bronchodilators-on chronic prednisone. ? ?[id number] cardioversion on initial presentation to the ED-subsequently required amiodarone infusion-currently maintaining sinus rhythm-on beta-blocker. ? ?Acute on chronic HFpEF: Volume status stable-continue Aldactone ? ?Extremity lymphedema: Appears stable ? ?HTN: BP stable-continue metoprolol. ? ?HLD: Continue statin ? ?DM-2 (A1c 7.5 on 3/22): No further hypoglycemic episodes-allow permissive hyperglycemia for now-continue SSI.  Follow CBG trend. ? ?Recent Labs  ?  07/15/21 ?[id number] 07/15/21 ?0747 07/15/21 ?1133  ?GLUCAP 198* 201* 243*  ? ?  ? ?GERD: Continue PPI ? ?Normocytic anemia: Due to acute illness-no  evidence of blood loss-transfuse if Hb <7 ? ?Endometrial thickening on CT abdomen: Will require outpatient follow-up with GYN. ? ?Opiate dependence: Oxycodone dose slightly to 10 mg every 4 hours-we will continue to titrate down as tolerated. ? ?Recent right pubic rami fracture: Supportive care at this point. ? ?Nutrition Status: ?Nutrition Problem: Inadequate oral intake ?Etiology: inability to eat ?Signs/Symptoms: NPO status ?Interventions: Tube feeding, Prostat ? ?Obesity ?Estimated body mass index is 35.81 kg/m? as calculated from the following: ?  Height as of this encounter: 5\' 5"  (1.651 m). ?  Weight as of this encounter: 97.6 kg.  ? ?Code status: ?  Code Status: Full Code  ? ?DVT Prophylaxis: Prophylactic Lovenox ? ?Family Communication: Spouse [phone]-called-no response on 4/12, 4/13.  Subsequently spoke with daughter [phone]-updated her regarding patient's condition-she will try to get in touch with the patient's spouse and have him come to the hospital or call the hospital. ? ?Disposition Plan: ?Status is: Inpatient ?Remains inpatient appropriate because: Septic shock-hypoxia-dysphagia on NG tube feeding-severely encephalopathic-not stable for discharge.  Per social work-[facility] transfer denied by insurance several days back. ?  ?Planned Discharge Destination: To be determined ? ? ?Diet: ?Diet Order   ? ?       ?  Diet NPO time specified  Diet effective now       ?  ? ?  ?  ? ?  ?  ? ? ?Antimicrobial agents: ?Anti-infectives (From admission, onward)  ? ? Start     Dose/Rate Route Frequency Ordered Stop  ? 07/15/21 1230  ceFAZolin (ANCEF) IVPB 2g/100 mL premix       ? 2 g ?200 mL/hr over 30 Minutes Intravenous Every 8 hours 07/15/21 1142    ?  07/14/21 1200  vancomycin (VANCOREADY) IVPB 1500 mg/300 mL  Status:  Discontinued       ? 1,500 mg ?150 mL/hr over 120 Minutes Intravenous Every 24 hours 07/13/21 0845 07/15/21 1139  ? 07/13/21 0915  vancomycin (VANCOREADY) IVPB 2000 mg/400 mL        ? 2,000 mg ?200 mL/hr over 120 Minutes Intravenous  Once 07/13/21 0829 07/13/21 1053  ? 07/05/21 1015  fluconazole (DIFLUCAN) tablet 200 mg  Status:  Discontinued       ? 200 mg Oral Daily 07/05/21 0916

## 2021-07-15 NOTE — Progress Notes (Signed)
Physical Therapy Treatment ?Patient Details ?Name: Gina Bradley ?MRN: [id number] ?DOB: 1961-01-22 ?Today's Date: 07/15/2021 ? ? ?History of Present Illness 61 yo female former smoker presented to [facility] ER on 3/21 with AMS, fever.  Found to have multifocal pneumonia and required intubation and pressors.  Transferred to [facility] 06/25/21 for treatment of acute on chronic hypoxic/hypercapnic respiratory failure from PNA, COPD with acute COPD, paroxysmal SVT, acute on chronic diastolic CHF, and acute metabolic encephalopathy in presence of peripheral neuropathy, back pain and chronic opiate use. PMH: CAD, smoker, heart failure, GERD, type 2 DM, hypertension, SVT, peripheral neuropathy, steroid dependent COPD, chronic pain history of DVT, chronic lymphedema, spinal stenosis, was recently diagnosed with compression deformity of the L4 vertebrae and subacute fracture of the right inferior pubic rami seen in the ED on 06/06/2021 after fall. ? ?  ?PT Comments  ? ? Patient restless in bed, pulling at bil mitts but unable to remove them. Opens eyes on command x 3. Sticks out tongue x 1 (would not repeat). Delayed following commands with extremities for exercises, but did follow commands x 4 extremities (see below for specific exercises). Will continue to work on EOB sitting balance as +2 assist available.  ?   ?Recommendations for follow up therapy are one component of a multi-disciplinary discharge planning process, led by the attending physician.  Recommendations may be updated based on patient status, additional functional criteria and insurance authorization. ? ?Follow Up Recommendations ? PT at Long-term acute care hospital ?  ?  ?Assistance Recommended at Discharge Frequent or constant Supervision/Assistance  ?Patient can return home with the following Two people to help with walking and/or transfers;A lot of help with bathing/dressing/bathroom;Assistance with cooking/housework;Assistance with feeding;Direct supervision/assist for  medications management;Direct supervision/assist for financial management;Assist for transportation;Help with stairs or ramp for entrance ?  ?Equipment Recommendations ? BSC/3in1;Wheelchair (measurements PT);Wheelchair cushion (measurements PT);Hospital bed;Other (comment) (hoyer lift)  ?  ?Recommendations for Other Services   ? ? ?  ?Precautions / Restrictions Precautions ?Precautions: Fall ?Precaution Comments: hx of fall with L4 compression fx ?Restrictions ?Weight Bearing Restrictions: No  ?  ? ?Mobility ? Bed Mobility ?  ?  ?  ?  ?  ?  ?  ?General bed mobility comments: elevated to semi-chair position for incr alertness ?  ? ?Transfers ?  ?  ?  ?  ?  ?  ?  ?  ?  ?  ?  ? ?Ambulation/Gait ?  ?  ?  ?  ?  ?  ?  ?  ? ? ?Stairs ?  ?  ?  ?  ?  ? ? ?Wheelchair Mobility ?  ? ?Modified Rankin (Stroke Patients Only) ?  ? ? ?  ?Balance   ?  ?  ?  ?  ?  ?  ?  ?  ?  ?  ?  ?  ?  ?  ?  ?  ?  ?  ?  ? ?  ?Cognition Arousal/Alertness: Awake/alert ?Behavior During Therapy: Restless ?Overall Cognitive Status: Difficult to assess ?  ?  ?  ?  ?  ?  ?  ?  ?  ?  ?  ?  ?  ?  ?  ?  ?General Comments: keeps eyes closed most of session, but will open when asked. Did stick tongue out x1 when asked (would not repeat). Following commands with delay x 4 extremities ?  ?  ? ?  ?Exercises General Exercises - Upper Extremity ?Shoulder Flexion:  AAROM, Both, 5 reps ?General Exercises - Lower Extremity ?Ankle Circles/Pumps: AROM, Both, 5 reps ?Short Arc Quad: AAROM, Both, 5 reps ?Heel Slides: Both, Supine, AAROM, 5 reps (resisted extension) ?Hip ABduction/ADduction: Both, Supine, PROM, 5 reps ? ?  ?General Comments   ?  ?  ? ?Pertinent Vitals/Pain Pain Assessment ?Pain Assessment: Faces ?Faces Pain Scale: No hurt  ? ? ?Home Living   ?  ?  ?  ?  ?  ?  ?  ?  ?  ?   ?  ?Prior Function    ?  ?  ?   ? ?PT Goals (current goals can now be found in the care plan section) Acute Rehab PT Goals ?Patient Stated Goal: none stated ?PT Goal Formulation: With  patient ?Time For Goal Achievement: 07/16/21 ?Potential to Achieve Goals: Fair ?Progress towards PT goals: Progressing toward goals ? ?  ?Frequency ? ? ? Min 2X/week ? ? ? ?  ?PT Plan Current plan remains appropriate  ? ? ?Co-evaluation   ?  ?  ?  ?  ? ?  ?AM-PAC PT "6 Clicks" Mobility   ?Outcome Measure ? Help needed turning from your back to your side while in a flat bed without using bedrails?: Total ?Help needed moving from lying on your back to sitting on the side of a flat bed without using bedrails?: Total ?Help needed moving to and from a bed to a chair (including a wheelchair)?: Total ?Help needed standing up from a chair using your arms (e.g., wheelchair or bedside chair)?: Total ?Help needed to walk in hospital room?: Total ?Help needed climbing 3-5 steps with a railing? : Total ?6 Click Score: 6 ? ?  ?End of Session Equipment Utilized During Treatment: Oxygen ?Activity Tolerance: Patient tolerated treatment well ?Patient left: in bed;with call bell/phone within reach;with bed alarm set ?  ?PT Visit Diagnosis: Unsteadiness on feet (R26.81);Muscle weakness (generalized) (M62.81);Other abnormalities of gait and mobility (R26.89);History of falling (Z91.81);Difficulty in walking, not elsewhere classified (R26.2) ?Pain - Right/Left: Left ?Pain - part of body: Hip ?  ? ? ?Time: 1550-1606 ?PT Time Calculation (min) (ACUTE ONLY): 16 min ? ?Charges:  $Therapeutic Exercise: 8-22 mins          ?          ? ? ?Arby Barrette, PT ?Acute Rehabilitation Services  ?Pager [phone] ?Office [phone] ? ? ? ?Gina Bradley ?07/15/2021, 4:20 PM ? ?

## 2021-07-16 ENCOUNTER — Inpatient Hospital Stay (HOSPITAL_COMMUNITY): Payer: 59

## 2021-07-16 DIAGNOSIS — I5032 Chronic diastolic (congestive) heart failure: Secondary | ICD-10-CM | POA: Diagnosis not present

## 2021-07-16 DIAGNOSIS — G9341 Metabolic encephalopathy: Secondary | ICD-10-CM | POA: Diagnosis not present

## 2021-07-16 DIAGNOSIS — R9389 Abnormal findings on diagnostic imaging of other specified body structures: Secondary | ICD-10-CM | POA: Diagnosis not present

## 2021-07-16 DIAGNOSIS — J9601 Acute respiratory failure with hypoxia: Secondary | ICD-10-CM | POA: Diagnosis not present

## 2021-07-16 LAB — CBC WITH DIFFERENTIAL/PLATELET
Abs Immature Granulocytes: 1.5 10*3/uL — ABNORMAL HIGH (ref 0.00–0.07)
Basophils Absolute: 0.2 10*3/uL — ABNORMAL HIGH (ref 0.0–0.1)
Basophils Relative: 1 %
Eosinophils Absolute: 0.2 10*3/uL (ref 0.0–0.5)
Eosinophils Relative: 1 %
HCT: 27.6 % — ABNORMAL LOW (ref 36.0–46.0)
Hemoglobin: 8.2 g/dL — ABNORMAL LOW (ref 12.0–15.0)
Lymphocytes Relative: 14 %
Lymphs Abs: 3.1 10*3/uL (ref 0.7–4.0)
MCH: 30.6 pg (ref 26.0–34.0)
MCHC: 29.7 g/dL — ABNORMAL LOW (ref 30.0–36.0)
MCV: 103 fL — ABNORMAL HIGH (ref 80.0–100.0)
Metamyelocytes Relative: 2 %
Monocytes Absolute: 1.3 10*3/uL — ABNORMAL HIGH (ref 0.1–1.0)
Monocytes Relative: 6 %
Myelocytes: 5 %
Neutro Abs: 15.6 10*3/uL — ABNORMAL HIGH (ref 1.7–7.7)
Neutrophils Relative %: 71 %
Platelets: 445 10*3/uL — ABNORMAL HIGH (ref 150–400)
RBC: 2.68 MIL/uL — ABNORMAL LOW (ref 3.87–5.11)
RDW: 19.7 % — ABNORMAL HIGH (ref 11.5–15.5)
WBC: 22 10*3/uL — ABNORMAL HIGH (ref 4.0–10.5)
nRBC: 1 /100 WBC — ABNORMAL HIGH
nRBC: 1.5 % — ABNORMAL HIGH (ref 0.0–0.2)

## 2021-07-16 LAB — COMPREHENSIVE METABOLIC PANEL
ALT: 40 U/L (ref 0–44)
AST: 32 U/L (ref 15–41)
Albumin: 2.4 g/dL — ABNORMAL LOW (ref 3.5–5.0)
Alkaline Phosphatase: 74 U/L (ref 38–126)
Anion gap: 8 (ref 5–15)
BUN: 15 mg/dL (ref 6–20)
CO2: 30 mmol/L (ref 22–32)
Calcium: 8.7 mg/dL — ABNORMAL LOW (ref 8.9–10.3)
Chloride: 97 mmol/L — ABNORMAL LOW (ref 98–111)
Creatinine, Ser: 0.53 mg/dL (ref 0.44–1.00)
GFR, Estimated: >60 mL/min (ref >60)
Glucose, Bld: 191 mg/dL — ABNORMAL HIGH (ref 70–99)
Potassium: 4 mmol/L (ref 3.5–5.1)
Sodium: 135 mmol/L (ref 135–145)
Total Bilirubin: 0.6 mg/dL (ref 0.3–1.2)
Total Protein: 6.4 g/dL — ABNORMAL LOW (ref 6.5–8.1)

## 2021-07-16 LAB — GLUCOSE, CAPILLARY
Glucose-Capillary: 174 mg/dL — ABNORMAL HIGH (ref 70–99)
Glucose-Capillary: 190 mg/dL — ABNORMAL HIGH (ref 70–99)
Glucose-Capillary: 274 mg/dL — ABNORMAL HIGH (ref 70–99)
Glucose-Capillary: 281 mg/dL — ABNORMAL HIGH (ref 70–99)
Glucose-Capillary: 221 mg/dL — ABNORMAL HIGH (ref 70–99)

## 2021-07-16 MED ORDER — FUROSEMIDE 10 MG/ML IJ SOLN
40.0000 mg | Freq: Once | INTRAMUSCULAR | Status: AC
Start: 2021-07-16 — End: 2021-07-16
  Administered 2021-07-16: 40 mg via INTRAVENOUS
  Filled 2021-07-16: qty 4

## 2021-07-16 MED ORDER — ACETYLCYSTEINE 20 % IN SOLN
4.0000 mL | Freq: Two times a day (BID) | RESPIRATORY_TRACT | Status: DC
  Administered 2021-07-16 – 2021-07-19 (×5): 4 mL via RESPIRATORY_TRACT
  Filled 2021-07-16 (×6): qty 4

## 2021-07-16 NOTE — Progress Notes (Signed)
?      ?                 PROGRESS NOTE ? ?      ?PATIENT DETAILS ?Name: Gina Bradley ?Age: 61 y.o. ?Sex: female ?Date of Birth: [birth year] ?Admit Date: 06/22/2021 ?Admitting Physician Rolla Plate, DO ?[id number], Purcell Nails, MD ? ?Brief Summary: ?Patient is a 61 y.o.  female with history of DM-2, HTN, SVT, HFpEF, steroid-dependent COPD, opiate dependence, recent compression fracture at L4/right inferior pubic rami fracture-who presented to [facility] ED with altered mental status, SOB with hypoxia, SVT-she was thought to have septic shock due to PNA-she was subsequently intubated-started on broad-spectrum antibiotics/pressors and managed in the ICU.  She was subsequently transferred to [facility] ICU-she was stabilized-however she had failure to wean and underwent a tracheostomy on 3/28.  While in the ICU-she developed fever requiring ID consultation-ICU delirium/encephalopathy requiring on and off infusion of Precedex.  After stability-she was transferred to [facility] on 4/11.  See below for further details. ? ?Significant events: ?3/21>> Admit to [facility], intubated, start pressors, cardiology consult for SVT, start amiodarone ?3/24>> transfer to [facility] ?3/28>> Trach for failure to wean ?4/11>> transfer to [facility] ? ?Significant studies: ?3/21>> CT head: No acute abnormalities. ?3/21>> CTA chest: No PE, multifocal pneumonia.  Multiple subacute/chronic rib fractures. ?3/21>> CT abdomen/pelvis: No acute finding-thickened endometrium. ?3/23>> RUQ ultrasound: No gallstones ?3/25>> Echo: EF 65-70% ?3/31>> MRI brain: No acute intracranial abnormality. ?4/1>> CT chest: Extensive multifocal patchy airspace consolidations. ?4/12>> EEG: No seizures. ? ?Significant microbiology data: ?3/21>> COVID/influenza PCR: Negative ?3/21>> blood culture: No growth ?3/21>> urine culture: No growth ?3/22>> tracheal aspirate: No growth ?3/23>> urine culture: 1000 colonies/mL of Staph aureus ?3/23>> blood culture: Staph hominis (contamination) ?3/27>> tracheal  aspirate: No growth ?3/31>> blood culture 1/2: Staph epidermidis (contamination) ?4/1>> tracheal aspirate: No growth ?4/3>> blood culture: No growth ?4/5>> respiratory virus panel: Meta pneumonia virus ?4/10>> tracheal aspirate: MSSA ? ?Procedures: ?3/28>> tracheostomy ? ?Consults: ?ID, PCCM, cardiology ? ?Subjective: ?Seems to be somewhat better today-she is awake-following some commands-trying to speak. ? ?Objective: ?Vitals: ?Blood pressure (!) 141/74, pulse (!) 101, temperature 99.4 ?F (37.4 ?C), temperature source Axillary, resp. rate 20, height 5\' 5"  (1.651 m), weight 97.6 kg, last menstrual period 01/05/2015, SpO2 95 %.  ? ?Exam: ?Gen Exam: Awake-not in any distress. ?HEENT:atraumatic, normocephalic ?Chest: B/L clear to auscultation anteriorly ?CVS:S1S2 regular ?Abdomen:soft non tender, non distended ?Extremities:+ edema-some scattered bruising. ?Neurology: Non focal ?Skin: no rash  ? ?Pertinent Labs/Radiology: ? ?  Latest Ref Rng & Units 07/16/2021  ?  1:09 AM 07/15/2021  ?  1:22 AM 07/14/2021  ?  1:08 AM  ?CBC  ?WBC 4.0 - 10.5 K/uL 22.0   20.3   19.1    ?Hemoglobin 12.0 - 15.0 g/dL 8.2   8.5   8.6    ?Hematocrit 36.0 - 46.0 % 27.6   27.7   28.7    ?Platelets 150 - 400 K/uL 445   421   389    ?  ?Lab Results  ?Component Value Date  ? NA 135 07/16/2021  ? K 4.0 07/16/2021  ? CL 97 (L) 07/16/2021  ? CO2 30 07/16/2021  ? ?  ? ?Assessment/Plan: ?Septic shock: Sepsis physiology improving-continues to have significant leukocytosis-had completed IV antimicrobial therapy while in the ICU, however most recent sputum cultures positive for MSSA-hence-on Ancef.   ? ?Acute on chronic hypoxic/hypercapnic respiratory failure due to multifocal pneumonia: Thought to be due to aspiration pneumonia/+/- metapneumovirus  pneumonia.  Had completed a course of antimicrobial therapy while in the ICU-however sputum cultures on 4/10 positive for MSSA-and on Ancef.  Per RT-secretions are less thick-continue with aggressive pulmonary  toileting-Mucomyst nebs/hypertonic saline nebs/bronchodilators/vibrator vest for 1 more day.  CXR done earlier today essentially unchanged compared to prior (personally reviewed) ? ?Acute metabolic encephalopathy: Likely multifactorial etiology-due to PNA/ICU delirium/sepsis/polypharmacy.  Some improvement in mental status over the past few days-however still confused and not yet at baseline.  Continue current dosing of Seroquel/primidone and Klonopin-dosage of oxycodone and Neurontin adjusted yesterday-we will plan on reassessing on 4/15.  Neuroimaging as above-EEG negative for seizures. ? ?Dysphagia: Due to severe encephalopathy/recent trach-cortak tube in place.  Mentation seems to be slowly improving-SLP reconsulted-once mentation is a bit more appropriate-May be able to start oral intake.  Follow closely. ? ?Transaminitis: Unclear significance-could have been from sepsis-improved-RUQ ultrasound was without acute abnormalities. ? ?COPD: Continue bronchodilators-on chronic prednisone. ? ?[id number] cardioversion on initial presentation to the ED-subsequently required amiodarone infusion-currently maintaining sinus rhythm-on beta-blocker. ? ?Acute on chronic HFpEF: Volume status stable-continue Aldactone ? ?Extremity lymphedema: Remains on Aldactone-due to significant positive balance-we will start intermittent dosing of IV Lasix. ? ?HTN: BP stable-continue metoprolol. ? ?HLD: Continue statin ? ?DM-2 (A1c 7.5 on 3/22): No further hypoglycemic episodes-allow permissive hyperglycemia for now-continue SSI.  Follow CBG trend. ? ?Recent Labs  ?  07/16/21 ?[id number] 07/16/21 ?[id number] 07/16/21 ?1132  ?GLUCAP 174* 190* 274*  ? ?  ? ?GERD: Continue PPI ? ?Normocytic anemia: Due to acute illness-no evidence of blood loss-transfuse if Hb <7 ? ?Endometrial thickening on CT abdomen: Will require outpatient follow-up with GYN. ? ?Opiate dependence: 10 mg every 4 hours-dosage adjusted on 4/13-we will reassess over the next few  days and slowly titrate down accordingly.  ? ?Recent right pubic rami fracture: Supportive care at this point. ? ?Nutrition Status: ?Nutrition Problem: Inadequate oral intake ?Etiology: inability to eat ?Signs/Symptoms: NPO status ?Interventions: Tube feeding, Prostat ? ?Obesity ?Estimated body mass index is 35.81 kg/m? as calculated from the following: ?  Height as of this encounter: 5\' 5"  (1.651 m). ?  Weight as of this encounter: 97.6 kg.  ? ?Code status: ?  Code Status: Full Code  ? ?DVT Prophylaxis: Prophylactic Lovenox ? ?Family Communication: Spouse Anthony-[phone]-called-no response on 4/12, 4/13.  Subsequently spoke with daughter Wendy-[phone]-updated her regarding patient's condition-she will try to get in touch with the patient's spouse and have him come to the hospital or call the hospital. ? ?Disposition Plan: ?Status is: Inpatient ?Remains inpatient appropriate because: Septic shock-hypoxia-dysphagia on NG tube feeding-severely encephalopathic-not stable for discharge.  Per social work-[facility] transfer denied by insurance several days back. ?  ?Planned Discharge Destination: To be determined ? ? ?Diet: ?Diet Order   ? ?       ?  Diet NPO time specified  Diet effective now       ?  ? ?  ?  ? ?  ?  ? ? ?Antimicrobial agents: ?Anti-infectives (From admission, onward)  ? ? Start     Dose/Rate Route Frequency Ordered Stop  ? 07/15/21 1230  ceFAZolin (ANCEF) IVPB 2g/100 mL premix       ? 2 g ?200 mL/hr over 30 Minutes Intravenous Every 8 hours 07/15/21 1142 07/22/21 1359  ? 07/14/21 1200  vancomycin (VANCOREADY) IVPB 1500 mg/300 mL  Status:  Discontinued       ? 1,500 mg ?150 mL/hr over 120 Minutes Intravenous Every 24 hours 07/13/21 0845 07/15/21 1139  ?  07/13/21 0915  vancomycin (VANCOREADY) IVPB 2000 mg/400 mL       ? 2,000 mg ?200 mL/hr over 120 Minutes Intravenous  Once 07/13/21 0829 07/13/21 1053  ? 07/05/21 1015  fluconazole (DIFLUCAN) tablet 200 mg  Status:  Discontinued       ? 200 mg Oral  Daily 07/05/21 0916 07/05/21 0917  ? 07/05/21 1015  fluconazole (DIFLUCAN) tablet 200 mg  Status:  Discontinued       ? 200 mg Per Tube Daily 07/05/21 0917 07/06/21 0924  ? 07/04/21 1800  vancomycin (VANCOREADY) I

## 2021-07-16 NOTE — Progress Notes (Addendum)
Occupational Therapy Treatment ?Patient Details ?Name: Gina Bradley ?MRN: [id number] ?DOB: 1960-08-04 ?Today's Date: 07/16/2021 ? ? ?History of present illness 61 yo female former smoker presented to [facility] ER on 3/21 with AMS, fever.  Found to have multifocal pneumonia and required intubation and pressors.  Transferred to [facility] 06/25/21 for treatment of acute on chronic hypoxic/hypercapnic respiratory failure from PNA, COPD with acute COPD, paroxysmal SVT, acute on chronic diastolic CHF, and acute metabolic encephalopathy in presence of peripheral neuropathy, back pain and chronic opiate use. PMH: CAD, smoker, heart failure, GERD, type 2 DM, hypertension, SVT, peripheral neuropathy, steroid dependent COPD, chronic pain history of DVT, chronic lymphedema, spinal stenosis, was recently diagnosed with compression deformity of the L4 vertebrae and subacute fracture of the right inferior pubic rami seen in the ED on 06/06/2021 after fall. ?  ?OT comments ? Pt supine in bed, opens eyes to name and voices "good morning" to therapist.  She initially follows simple command (squeeze hand), but fades quickly.  Trach collar on forehead upon entry, SpO2 desaturating to 84% and recovered when placed over trach.  HOH to wash face with total assist. Patient with no awareness to deficits and becomes lethargic quickly. Anticipate medication related after speaking to RN.  Will follow acutely.   ? ?Recommendations for follow up therapy are one component of a multi-disciplinary discharge planning process, led by the attending physician.  Recommendations may be updated based on patient status, additional functional criteria and insurance authorization. ?   ?Follow Up Recommendations ? OT at Long-term acute care hospital  ?  ?Assistance Recommended at Discharge Frequent or constant Supervision/Assistance  ?Patient can return home with the following ? Two people to help with walking and/or transfers;Two people to help with  bathing/dressing/bathroom;Assistance with feeding;Direct supervision/assist for medications management;Direct supervision/assist for financial management;Assist for transportation;Help with stairs or ramp for entrance;Assistance with cooking/housework ?  ?Equipment Recommendations ? Other (comment) (defer)  ?  ?Recommendations for Other Services   ? ?  ?Precautions / Restrictions Precautions ?Precautions: Fall ?Precaution Comments: hx of fall with L4 compression fx ?Restrictions ?Weight Bearing Restrictions: No  ? ? ?  ? ?Mobility Bed Mobility ?  ?  ?  ?  ?  ?  ?  ?General bed mobility comments: in semi-chair position in bed ?  ? ?Transfers ?  ?  ?  ?  ?  ?  ?  ?  ?  ?  ?  ?  ?Balance   ?  ?  ?  ?  ?  ?  ?  ?  ?  ?  ?  ?  ?  ?  ?  ?  ?  ?  ?   ? ?ADL either performed or assessed with clinical judgement  ? ?ADL Overall ADL's : Needs assistance/impaired ?  ?  ?Grooming: Total assistance;Bed level ?Grooming Details (indicate cue type and reason): hand over hand to wash face ?  ?  ?  ?  ?  ?  ?  ?  ?  ?  ?  ?  ?  ?  ?  ?  ?  ? ?Extremity/Trunk Assessment   ?  ?  ?  ?  ?  ? ?Vision   ?  ?  ?Perception   ?  ?Praxis   ?  ? ?Cognition Arousal/Alertness: Lethargic, Suspect due to medications ?Behavior During Therapy: Flat affect ?Overall Cognitive Status: Difficult to assess ?  ?  ?  ?  ?  ?  ?  ?  ?  ?  ?  ?  ?  ?  ?  ?  ?  General Comments: pt initally opens eyes to name, follows simple commands to squeeze hand and voices "good morning" but she fades quickly.  Spoke to RN after session and reports recieving medication  to calm her down. ?  ?  ?   ?Exercises Exercises: Other exercises ?Other Exercises ?Other Exercises: PROM to BUEs due to lethargy pt not engaging ? ?  ?Shoulder Instructions   ? ? ?  ?General Comments on trach collar 8L, initally over forehead upon entry with Spo2 84% therapist placed over trach and saturations improved to >94%  ? ? ?Pertinent Vitals/ Pain       Pain Assessment ?Pain Assessment: Faces ?Faces  Pain Scale: No hurt ? ?Home Living   ?  ?  ?  ?  ?  ?  ?  ?  ?  ?  ?  ?  ?  ?  ?  ?  ?  ?  ? ?  ?Prior Functioning/Environment    ?  ?  ?  ?   ? ?Frequency ? Min 2X/week  ? ? ? ? ?  ?Progress Toward Goals ? ?OT Goals(current goals can now be found in the care plan section) ? Progress towards OT goals: Progressing toward goals ? ?Acute Rehab OT Goals ?Patient Stated Goal: none stated ?OT Goal Formulation: Patient unable to participate in goal setting  ?Plan Discharge plan remains appropriate;Frequency remains appropriate   ? ?Co-evaluation ? ? ?   ?  ?  ?  ?  ? ?  ?AM-PAC OT "6 Clicks" Daily Activity     ?Outcome Measure ? ? Help from another person eating meals?: Total ?Help from another person taking care of personal grooming?: Total ?Help from another person toileting, which includes using toliet, bedpan, or urinal?: Total ?Help from another person bathing (including washing, rinsing, drying)?: Total ?Help from another person to put on and taking off regular upper body clothing?: Total ?Help from another person to put on and taking off regular lower body clothing?: Total ?6 Click Score: 6 ? ?  ?End of Session Equipment Utilized During Treatment: Oxygen ? ?OT Visit Diagnosis: Other abnormalities of gait and mobility (R26.89);Muscle weakness (generalized) (M62.81);Other symptoms and signs involving cognitive function ?  ?Activity Tolerance Patient limited by lethargy ?  ?Patient Left in bed;with call bell/phone within reach;with bed alarm set;with restraints reapplied ?  ?Nurse Communication Mobility status ?  ? ?   ? ?Time: [id number] ?OT Time Calculation (min): 10 min ? ?Charges: OT General Charges ?$OT Visit: 1 Visit ?OT Treatments ?$Self Care/Home Management : 8-22 mins ? ?Jolaine Artist, OT ?Acute Rehabilitation Services ?Pager [phone] ?Office [phone] ? ? ?Delight Stare ?07/16/2021, 9:30 AM ?

## 2021-07-16 NOTE — Progress Notes (Signed)
Speech Language Pathology Treatment: [name] Speaking valve  ?Patient Details ?Name: Gina Bradley ?MRN: [id number] ?DOB: 08-01-1960 ?Today's Date: 07/16/2021 ?Time: 0881-1031 ?SLP Time Calculation (min) (ACUTE ONLY): 15 min ? ?Assessment / Plan / Recommendation ?Clinical Impression ? Pt was awake, kept eyes closed but became more lethargic as session progressed due to receiving Klonopin around 1400. She was restless, mildly agitated and trying to remove her mitts. She was responsive to questions approximately 40% of the time and followed commands intermittently when able to attend. Exhaled air was able to redirect through her upper airway for single word utterances or 3 word phrases on several occassions although mostly unintelligible for non context utterances. She named her dog after looking at a picture but did not answer other related questions. Cuff was deflated at baseline and pt wore speaking valve for intervals of 3 minutes without evidence of back pressure. All vitals were within normal range. Recommend pt wear valve with ST only for now mostly due to her lethargy. She has made progress since last session and predict she should continue to improve especially once she is better able to calmly attend to her surroundings.  ?  ?HPI HPI: 61 yo female former smoker presented to [facility] ER on 3/21 with AMS, fever.  Found to have multifocal pneumonia and required intubation and pressors.  Transferred to [facility] for further management. ETT 3/21; trach 3/28 secondary to failure to wean; TC trials 4/8. Dx acute metabolic encephalopathy, pmhx peripheral neuropathy, HF, HTN, DM2. ?  ?   ?SLP Plan ? Continue with current plan of care ? ?  ?  ?Recommendations for follow up therapy are one component of a multi-disciplinary discharge planning process, led by the attending physician.  Recommendations may be updated based on patient status, additional functional criteria and insurance authorization. ?  ? ?Recommendations  ?   ?    ? Patient may use Passy-Muir Speech Valve: with SLP only  ?   ? ? ? ? Oral Care Recommendations: Oral care QID ?Follow Up Recommendations: Other (comment) (TBD) ?SLP Visit Diagnosis: Aphonia (R49.1) ?Plan: Continue with current plan of care ? ? ? ? ?  ?  ? ? ?Royce Macadamia ? ?07/16/2021, 2:31 PM ?

## 2021-07-16 NOTE — Progress Notes (Signed)
Patient seen today by trach team for consult.  No education is needed at this time.  All necessary equipment is at beside.   Will continue to follow for progression.  

## 2021-07-17 DIAGNOSIS — J9602 Acute respiratory failure with hypercapnia: Secondary | ICD-10-CM | POA: Diagnosis not present

## 2021-07-17 DIAGNOSIS — G9341 Metabolic encephalopathy: Secondary | ICD-10-CM | POA: Diagnosis not present

## 2021-07-17 DIAGNOSIS — R9389 Abnormal findings on diagnostic imaging of other specified body structures: Secondary | ICD-10-CM | POA: Diagnosis not present

## 2021-07-17 DIAGNOSIS — I5032 Chronic diastolic (congestive) heart failure: Secondary | ICD-10-CM | POA: Diagnosis not present

## 2021-07-17 DIAGNOSIS — J9601 Acute respiratory failure with hypoxia: Secondary | ICD-10-CM | POA: Diagnosis not present

## 2021-07-17 DIAGNOSIS — Z515 Encounter for palliative care: Secondary | ICD-10-CM | POA: Diagnosis not present

## 2021-07-17 LAB — CBC
HCT: 30.9 % — ABNORMAL LOW (ref 36.0–46.0)
Hemoglobin: 9.4 g/dL — ABNORMAL LOW (ref 12.0–15.0)
MCH: 30.2 pg (ref 26.0–34.0)
MCHC: 30.4 g/dL (ref 30.0–36.0)
MCV: 99.4 fL (ref 80.0–100.0)
Platelets: 448 10*3/uL — ABNORMAL HIGH (ref 150–400)
RBC: 3.11 MIL/uL — ABNORMAL LOW (ref 3.87–5.11)
RDW: 19.7 % — ABNORMAL HIGH (ref 11.5–15.5)
WBC: 23.6 10*3/uL — ABNORMAL HIGH (ref 4.0–10.5)
nRBC: 1.3 % — ABNORMAL HIGH (ref 0.0–0.2)

## 2021-07-17 LAB — GLUCOSE, CAPILLARY
Glucose-Capillary: 198 mg/dL — ABNORMAL HIGH (ref 70–99)
Glucose-Capillary: 223 mg/dL — ABNORMAL HIGH (ref 70–99)
Glucose-Capillary: 233 mg/dL — ABNORMAL HIGH (ref 70–99)
Glucose-Capillary: 265 mg/dL — ABNORMAL HIGH (ref 70–99)
Glucose-Capillary: 271 mg/dL — ABNORMAL HIGH (ref 70–99)
Glucose-Capillary: 352 mg/dL — ABNORMAL HIGH (ref 70–99)

## 2021-07-17 LAB — BASIC METABOLIC PANEL
Anion gap: 10 (ref 5–15)
BUN: 17 mg/dL (ref 6–20)
CO2: 32 mmol/L (ref 22–32)
Calcium: 8.9 mg/dL (ref 8.9–10.3)
Chloride: 95 mmol/L — ABNORMAL LOW (ref 98–111)
Creatinine, Ser: 0.62 mg/dL (ref 0.44–1.00)
GFR, Estimated: >60 mL/min (ref >60)
Glucose, Bld: 191 mg/dL — ABNORMAL HIGH (ref 70–99)
Potassium: 3.8 mmol/L (ref 3.5–5.1)
Sodium: 137 mmol/L (ref 135–145)

## 2021-07-17 MED ORDER — FUROSEMIDE 10 MG/ML IJ SOLN
20.0000 mg | Freq: Once | INTRAMUSCULAR | Status: AC
  Administered 2021-07-17: 20 mg via INTRAVENOUS
  Filled 2021-07-17: qty 2

## 2021-07-17 MED ORDER — OXYCODONE HCL 5 MG PO TABS
10.0000 mg | ORAL_TABLET | Freq: Four times a day (QID) | ORAL | Status: DC
  Administered 2021-07-17 – 2021-07-19 (×8): 10 mg
  Filled 2021-07-17 (×8): qty 2

## 2021-07-17 MED ORDER — CLONAZEPAM 0.5 MG PO TABS
0.5000 mg | ORAL_TABLET | Freq: Three times a day (TID) | ORAL | Status: DC
  Administered 2021-07-17 – 2021-07-19 (×5): 0.5 mg
  Filled 2021-07-17 (×5): qty 1

## 2021-07-17 NOTE — Progress Notes (Signed)
?      ?                 PROGRESS NOTE ? ?      ?PATIENT DETAILS ?Name: Gina Bradley ?Age: 61 y.o. ?Sex: female ?Date of Birth: 06/01/1960 ?Admit Date: 06/22/2021 ?Admitting Physician Rolla Plate, DO ?[id number], Purcell Nails, MD ? ?Brief Summary: ?Patient is a 61 y.o.  female with history of DM-2, HTN, SVT, HFpEF, steroid-dependent COPD, opiate dependence, recent compression fracture at L4/right inferior pubic rami fracture-who presented to [facility] ED with altered mental status, SOB with hypoxia, SVT-she was thought to have septic shock due to PNA-she was subsequently intubated-started on broad-spectrum antibiotics/pressors and managed in the ICU.  She was subsequently transferred to [facility] ICU-she was stabilized-however she had failure to wean and underwent a tracheostomy on 3/28.  While in the ICU-she developed fever requiring ID consultation-ICU delirium/encephalopathy requiring on and off infusion of Precedex.  After stability-she was transferred to [facility] on 4/11.  See below for further details. ? ?Significant events: ?3/21>> Admit to [facility], intubated, start pressors, cardiology consult for SVT, start amiodarone ?3/24>> transfer to [facility] ?3/28>> Trach for failure to wean ?4/11>> transfer to [facility] ? ?Significant studies: ?3/21>> CT head: No acute abnormalities. ?3/21>> CTA chest: No PE, multifocal pneumonia.  Multiple subacute/chronic rib fractures. ?3/21>> CT abdomen/pelvis: No acute finding-thickened endometrium. ?3/23>> RUQ ultrasound: No gallstones ?3/25>> Echo: EF 65-70% ?3/31>> MRI brain: No acute intracranial abnormality. ?4/01>> CT chest: Extensive multifocal patchy airspace consolidations. ?4/12>> EEG: No seizures. ? ?Significant microbiology data: ?3/21>> COVID/influenza PCR: Negative ?3/21>> blood culture: No growth ?3/21>> urine culture: No growth ?3/22>> tracheal aspirate: No growth ?3/23>> urine culture: 1000 colonies/mL of Staph aureus ?3/23>> blood culture: Staph hominis (contamination) ?3/27>> tracheal  aspirate: No growth ?3/31>> blood culture 1/2: Staph epidermidis (contamination) ?4/01>> tracheal aspirate: No growth ?4/03>> blood culture: No growth ?4/05>> respiratory virus panel: Meta pneumonia virus ?4/10>> tracheal aspirate: MSSA ? ?Procedures: ?3/28>> tracheostomy ? ?Consults: ?ID, PCCM, cardiology ? ?Subjective: ?Very slow improvement continues-awake-follows simple commands.  Attempts to talk (trach in place) ? ?Objective: ?Vitals: ?Blood pressure (!) 128/101, pulse (!) 113, temperature (!) 97.5 ?F (36.4 ?C), temperature source Axillary, resp. rate (!) 22, height 5\' 5"  (1.651 m), weight 94.7 kg, last menstrual period 01/05/2015, SpO2 100 %.  ? ?Exam: ?Gen Exam:Alert awake-not in any distress ?HEENT:atraumatic, normocephalic ?Chest: B/L clear to auscultation anteriorly ?CVS:S1S2 regular ?Abdomen:soft non tender, non distended ?Extremities:+ edema ?Neurology: Difficult exam but seems to be moving all 4 extremities. ?Skin: no rash  ? ?Pertinent Labs/Radiology: ? ?  Latest Ref Rng & Units 07/17/2021  ? 12:37 AM 07/16/2021  ?  1:09 AM 07/15/2021  ?  1:22 AM  ?CBC  ?WBC 4.0 - 10.5 K/uL 23.6   22.0   20.3    ?Hemoglobin 12.0 - 15.0 g/dL 9.4   8.2   8.5    ?Hematocrit 36.0 - 46.0 % 30.9   27.6   27.7    ?Platelets 150 - 400 K/uL 448   445   421    ?  ?Lab Results  ?Component Value Date  ? NA 137 07/17/2021  ? K 3.8 07/17/2021  ? CL 95 (L) 07/17/2021  ? CO2 32 07/17/2021  ? ?  ? ?Assessment/Plan: ?Septic shock: Sepsis physiology has resolved-however continues to have persistent leukocytosis.  Had completed a course of antimicrobial therapy while in the ICU-however most sputum cultures on 4/10 positive for MSSA-hence on Ancef.   ? ?Acute on chronic  hypoxic/hypercapnic respiratory failure due to multifocal pneumonia: Thought to be due to aspiration pneumonia/+/- metapneumovirus pneumonia.  Had completed a course of antimicrobial therapy while in the ICU-however sputum cultures on 4/10 positive for MSSA-and on Ancef.  Per  RT-secretions are less thick-continue with aggressive pulmonary toileting-Mucomyst nebs/hypertonic saline nebs/bronchodilators/vibrator vest.  We will repeat CXR again over the next few days.   ? ?Acute metabolic encephalopathy: Likely multifactorial etiology-due to PNA/ICU delirium/sepsis/polypharmacy.  Some slow improvement in mental status over the past few days-however she is not yet at baseline.  She is awake-follow simple commands but is still somewhat sleepy.  She still requires intermittent restraints.  Continue Seroquel/brimonidine-have reduced dose of Klonopin/oxycodone slightly-remains on Neurontin as well.  Continue to slowly optimize medications-neuroimaging/EEG as above. ? ?Dysphagia: Due to severe encephalopathy/recent trach-cortak tube in place.  Mentation seems to be slowly improving-SLP following-not yet ready to transition to oral intake.  Will try to get in touch with family today to see if they would want to pursue PEG tube if she does not improve appropriately over the next few days. ? ?Transaminitis: Unclear significance-could have been from sepsis-improved-RUQ ultrasound was without acute abnormalities. ? ?COPD: Continue bronchodilators-on chronic prednisone. ? ?[id number] cardioversion on initial presentation to the ED-subsequently required amiodarone infusion-currently maintaining sinus rhythm-on beta-blocker. ? ?Acute on chronic HFpEF: Volume status stable-continue Aldactone ? ?Extremity lymphedema: Remains on Aldactone-still has significant lower extremity edema-repeat dose of IV Lasix today.  ? ?HTN: BP stable-continue metoprolol. ? ?HLD: Continue statin ? ?DM-2 (A1c 7.5 on 3/22): No further hypoglycemic episodes-allow permissive hyperglycemia for now-continue SSI.  Follow CBG trend. ? ?Recent Labs  ?  07/17/21 ?0021 07/17/21 ?0407 07/17/21 ?[id number]  ?GLUCAP 198* 233* 265*  ? ?  ? ?GERD: Continue PPI ? ?Normocytic anemia: Due to acute illness-no evidence of blood loss-transfuse if Hb  <7 ? ?Endometrial thickening on CT abdomen: Will require outpatient follow-up with GYN. ? ?Opiate dependence: Have decreased oxycodone to 10 mg every 6 hours today-continue to slowly titrate down depending on mental status improvement.   ? ?Recent right pubic rami fracture: Supportive care at this point. ? ?Palliative Care: Full code in place.  Complicated patient with prolonged hospital stay-including long ICU stay-still very encephalopathic.  Has trach-NG tube in place.  Have made numerous attempts to get in touch with patient's spouse-however unable to get in touch with him, I have also spoken with the patient's daughter-who also has been unsuccessful in getting in touch with the patient's spouse.  Have discussed with daughter-she is in the process of talking with social services as well.  I have placed a palliative care consult-I have asked social worker to see if we can get some assistance to see if he can get into to the patient's spouse-as we probably need family conference for goals of care discussion.  ? ?Nutrition Status: ?Nutrition Problem: Inadequate oral intake ?Etiology: inability to eat ?Signs/Symptoms: NPO status ?Interventions: Tube feeding, Prostat ? ?Obesity ?Estimated body mass index is 34.74 kg/m? as calculated from the following: ?  Height as of this encounter: 5\' 5"  (1.651 m). ?  Weight as of this encounter: 94.7 kg.  ? ?Code status: ?  Code Status: Full Code  ? ?DVT Prophylaxis: Prophylactic Lovenox ? ?Family Communication: Spouse [phone]-called-no response on 4/12, 4/13, 4/15. Called son [name] [phone] on 4/15-left voicemail.  Spoke with daughter [name] [phone] on 4/15 ? ? ?Disposition Plan: ?Status is: Inpatient ?Remains inpatient appropriate because: Septic shock-hypoxia-dysphagia on NG tube feeding-severely encephalopathic-not stable for discharge.  Per social work-[facility] transfer denied by insurance several days back. ?  ?Planned Discharge Destination: To be  determined ? ? ?Diet: ?Diet Order   ? ?       ?  Diet NPO time specified  Diet effective now       ?  ? ?  ?  ? ?  ?  ? ? ?Antimicrobial agents: ?Anti-infectives (From admission, onward)  ? ? Start     Dose/Rat

## 2021-07-17 NOTE — Consult Note (Signed)
?Consultation Note ?Date: 07/17/2021  ? ?Patient Name: Gina Bradley  ?DOB: Oct 30, 1960  MRN: [id number]  Age / Sex: 61 y.o., female  ?PCP: Redmond School, MD ?Referring Physician: Jonetta Osgood, MD ? ?Reason for Consultation: Establishing goals of care ? ?HPI/Patient Profile: 61 y.o. female  with past medical history of DM-2, HTN, SVT, HFpEF, steroid-dependent COPD, opiate dependence, and recent compression fracture at L4/right inferior pubic rami fracture admitted on 06/22/2021 with AMS, shortness of breath, and SVT. She was diagnosed with septic shock due to pneumonia and required intubation.  Patient was unable to wean and required trach on March 28.  Patient now on trach collar.  Patient is not safe for p.o. intake and so continues to have core track in place.  Patient severely encephalopathic.  Medical team has had difficulty getting in touch with patient's family.  PMT consulted to discuss goals of care. ? ?Clinical Assessment and Goals of Care: ?I have reviewed medical records including EPIC notes, labs and imaging, and assessed the patient.  Patient did not respond to voice or gentle touch.  I attempted deep sternal rub to which patient only fluttered her eyes-unable to track or focus.  Did not follow any commands for me.  No family at bedside. ? ?Patient has no advanced directives or healthcare power of attorney listed within epic.  Per [facility] law her next of kin would be her surrogate decision maker which would be her spouse. ? ?Made attempts to reach spouse to discuss diagnosis prognosis, GOC, EOL wishes, disposition and options.  Called spouse at 2 numbers listed in chart  ?[phone]   [phone]  ?There was no answer and unable to leave a voicemail. ?I also attempted home phone number listed in patient's chart ?[phone] ?There was no answer at this number either but I was able to leave a voicemail with my callback number. ? ?Noted that  Dr. Sloan Leiter has consulted transitions of care team for assistance in establishing contact with patient's spouse.  It was also noted that Dr. Sloan Leiter was able to speak with patient's daughter and daughter has not spoken with patient's spouse either or been able to get in touch with him. ? ?Will allow transitions of care team time to perform due diligence to establish contact with patient's spouse.  If we cannot get in touch with patient's spouse we will have to rely on subsequent next of kin which would be patient's son and daughter.  At that time we would arrange goals of care discussions with him. ? ? ?Primary Decision Maker ?NEXT OF KIN - spouse though at this time does not seem to be reasonably available, allowing time for [facility] to attempt contact with him, if no contact established son and daughter become decision makers ?  ?SUMMARY OF RECOMMENDATIONS   ?allowing time for TOC to attempt contact with spouse, if no contact established son and daughter become decision makers and we can arrange goals of care discussions with them ? ?  ? ?Primary Diagnoses: ?Present on Admission: ? GERD (gastroesophageal reflux disease) ? Right subacute Inferior pubic ramus fracture ? HTN (hypertension), benign ? Endometrial thickening on CT scan ? ? ?I have reviewed the medical record, interviewed the patient and family, and examined the patient. The following aspects are pertinent. ? ?Past Medical History:  ?Diagnosis Date  ? Anemia   ? Cervical disc disease   ? COPD (chronic obstructive pulmonary disease) ([place])   ? Diabetes mellitus without complication ([place])   ? Essential hypertension   ?  GERD (gastroesophageal reflux disease)   ? Heart failure ([place]) 2018  ? History of cardiac catheterization   ? Normal coronaries March 2015  ? History of non-ST elevation myocardial infarction (NSTEMI)   ? Secondary to SVT  ? Lymphedema   ? Peripheral neuropathy   ? Spinal stenosis   ? SVT (supraventricular tachycardia) ([place])   ? ?Social History   ? ?Socioeconomic History  ? Marital status: Married  ?  Spouse name: Not on file  ? Number of children: Not on file  ? Years of education: Not on file  ? Highest education level: Not on file  ?Occupational History  ? Occupation: [employer]  ?  Employer: [employer]  ?Tobacco Use  ? Smoking status: Former  ?  Packs/day: 1.00  ?  Years: 18.00  ?  Pack years: 18.00  ?  Types: Cigarettes  ?  Start date: 09/17/2003  ?  Quit date: 02/01/2019  ?  Years since quitting: 2.4  ? Smokeless tobacco: Never  ?Vaping Use  ? Vaping Use: Never used  ?Substance and Sexual Activity  ? Alcohol use: No  ?  Alcohol/week: 0.0 standard drinks  ? Drug use: Yes  ?  Types: Marijuana  ? Sexual activity: Not Currently  ?  Birth control/protection: Post-menopausal  ?Other Topics Concern  ? Not on file  ?Social History Narrative  ? Not on file  ? ?Social Determinants of Health  ? ?Financial Resource Strain: Not on file  ?Food Insecurity: Not on file  ?Transportation Needs: Not on file  ?Physical Activity: Not on file  ?Stress: Not on file  ?Social Connections: Not on file  ? ?Family History  ?Problem Relation Age of Onset  ? Heart attack Mother   ? Diabetes Mother   ? Hypertension Mother   ? Angina Mother   ? Diabetes Father   ? Hypertension Father   ? Heart failure Brother   ? Suicidality Sister   ? Colon cancer Other   ?     Possibly Dad   ? Stroke Maternal Grandmother   ? Heart failure Maternal Grandmother   ? Diabetes Maternal Grandfather   ? Hypertension Daughter   ? Hypertension Son   ? ?Scheduled Meds: ? acetylcysteine  4 mL Nebulization BID  ? arformoterol  15 mcg Nebulization BID  ? aspirin  81 mg Per Tube Daily  ? atorvastatin  40 mg Per Tube Daily  ? budesonide (PULMICORT) nebulizer solution  0.25 mg Nebulization BID  ? chlorhexidine  15 mL Mouth Rinse BID  ? Chlorhexidine Gluconate Cloth  6 each Topical Q0600  ? clonazePAM  0.5 mg Per Tube Q8H  ? enoxaparin (LOVENOX) injection  50 mg Subcutaneous Q24H  ? feeding supplement (PROSource  TF)  45 mL Per Tube QID  ? free water  100 mL Per Tube Q6H  ? gabapentin  300 mg Per Tube Q8H  ? guaiFENesin  30 mL Per Tube Q6H  ? insulin aspart  0-9 Units Subcutaneous Q4H  ? mouth rinse  15 mL Mouth Rinse q12n4p  ? metoprolol tartrate  25 mg Per Tube BID  ? oxyCODONE  10 mg Per Tube Q6H  ? pantoprazole sodium  40 mg Per Tube Daily  ? predniSONE  10 mg Per Tube Q breakfast  ? primidone  50 mg Per Tube QHS  ? QUEtiapine  100 mg Per Tube BID  ? revefenacin  175 mcg Nebulization Daily  ? senna  1 tablet Per Tube Daily  ?  spironolactone  50 mg Per Tube BID  ? ?Continuous Infusions: ? sodium chloride 10 mL/hr at 07/16/21 0400  ?  ceFAZolin (ANCEF) IV 2 g (07/17/21 1353)  ? feeding supplement (VITAL 1.5 CAL) 1,000 mL (07/17/21 0228)  ? ?PRN Meds:.sodium chloride, acetaminophen, albuterol, diphenhydrAMINE, hydrALAZINE, ondansetron (ZOFRAN) IV ?Allergies  ?Allergen Reactions  ? Bee Venom Other (See Comments)  ?  Unknown  ? Tdap [Tetanus-Diphth-Acell Pertussis] Other (See Comments)  ?  Unknown  ? ?Review of Systems  ?Unable to perform ROS: Mental status change  ? ?Physical Exam ?Constitutional:   ?   Comments: Only flutters eyelids to deep sternal rub, does not follow commands  ?Pulmonary:  ?   Effort: Pulmonary effort is normal.  ?Abdominal:  ?   Palpations: Abdomen is soft.  ?Skin: ?   General: Skin is warm and dry.  ? ? ?Vital Signs: BP 116/65 (BP Location: Right Leg)   Pulse 97   Temp (!) 97.5 ?F (36.4 ?C) (Axillary)   Resp 19   Ht 5\' 5"  (1.651 m)   Wt 94.7 kg   LMP 01/05/2015   SpO2 100%   BMI 34.74 kg/m?  ?Pain Scale: 0-10 ?POSS *See Group Information*: S-Acceptable,Sleep, easy to arouse ?Pain Score: Asleep ? ? ?SpO2: SpO2: 100 % ?O2 Device:SpO2: 100 % ?O2 Flow Rate: .O2 Flow Rate (L/min): 8 L/min ? ?IO: Intake/output summary: No intake or output data in the 24 hours ending 07/17/21 1424 ? ?LBM: Last BM Date :  (N/A  Simultaneous filing. User may not have seen previous data.) ?Baseline Weight: Weight: 100  kg ?Most recent weight: Weight: 94.7 kg     ? ?*Please note that this is a verbal dictation therefore any spelling or grammatical errors are due to the "[place] [employer]" system interpretation. ? ?Lawrence Santiago [facility]

## 2021-07-18 DIAGNOSIS — G9341 Metabolic encephalopathy: Secondary | ICD-10-CM | POA: Diagnosis not present

## 2021-07-18 DIAGNOSIS — R9389 Abnormal findings on diagnostic imaging of other specified body structures: Secondary | ICD-10-CM | POA: Diagnosis not present

## 2021-07-18 DIAGNOSIS — I5032 Chronic diastolic (congestive) heart failure: Secondary | ICD-10-CM | POA: Diagnosis not present

## 2021-07-18 DIAGNOSIS — J9601 Acute respiratory failure with hypoxia: Secondary | ICD-10-CM | POA: Diagnosis not present

## 2021-07-18 LAB — GLUCOSE, CAPILLARY
Glucose-Capillary: 222 mg/dL — ABNORMAL HIGH (ref 70–99)
Glucose-Capillary: 259 mg/dL — ABNORMAL HIGH (ref 70–99)
Glucose-Capillary: 293 mg/dL — ABNORMAL HIGH (ref 70–99)
Glucose-Capillary: 372 mg/dL — ABNORMAL HIGH (ref 70–99)
Glucose-Capillary: 257 mg/dL — ABNORMAL HIGH (ref 70–99)
Glucose-Capillary: 166 mg/dL — ABNORMAL HIGH (ref 70–99)

## 2021-07-18 LAB — CBC
HCT: 30 % — ABNORMAL LOW (ref 36.0–46.0)
Hemoglobin: 9.1 g/dL — ABNORMAL LOW (ref 12.0–15.0)
MCH: 30.7 pg (ref 26.0–34.0)
MCHC: 30.3 g/dL (ref 30.0–36.0)
MCV: 101.4 fL — ABNORMAL HIGH (ref 80.0–100.0)
Platelets: 451 10*3/uL — ABNORMAL HIGH (ref 150–400)
RBC: 2.96 MIL/uL — ABNORMAL LOW (ref 3.87–5.11)
RDW: 19.9 % — ABNORMAL HIGH (ref 11.5–15.5)
WBC: 22.9 10*3/uL — ABNORMAL HIGH (ref 4.0–10.5)
nRBC: 0.9 % — ABNORMAL HIGH (ref 0.0–0.2)

## 2021-07-18 LAB — COMPREHENSIVE METABOLIC PANEL
ALT: 15 U/L (ref 0–44)
AST: 30 U/L (ref 15–41)
Albumin: 2.5 g/dL — ABNORMAL LOW (ref 3.5–5.0)
Alkaline Phosphatase: 72 U/L (ref 38–126)
Anion gap: 10 (ref 5–15)
BUN: 18 mg/dL (ref 6–20)
CO2: 31 mmol/L (ref 22–32)
Calcium: 8.9 mg/dL (ref 8.9–10.3)
Chloride: 93 mmol/L — ABNORMAL LOW (ref 98–111)
Creatinine, Ser: 0.6 mg/dL (ref 0.44–1.00)
GFR, Estimated: >60 mL/min (ref >60)
Glucose, Bld: 235 mg/dL — ABNORMAL HIGH (ref 70–99)
Potassium: 3.9 mmol/L (ref 3.5–5.1)
Sodium: 134 mmol/L — ABNORMAL LOW (ref 135–145)
Total Bilirubin: 0.6 mg/dL (ref 0.3–1.2)
Total Protein: 6.2 g/dL — ABNORMAL LOW (ref 6.5–8.1)

## 2021-07-18 MED ORDER — FUROSEMIDE 10 MG/ML IJ SOLN
40.0000 mg | Freq: Once | INTRAMUSCULAR | Status: AC
  Administered 2021-07-18: 40 mg via INTRAVENOUS
  Filled 2021-07-18: qty 4

## 2021-07-18 MED ORDER — INSULIN ASPART 100 UNIT/ML IJ SOLN
3.0000 [IU] | INTRAMUSCULAR | Status: DC
  Administered 2021-07-18 – 2021-07-21 (×18): 3 [IU] via SUBCUTANEOUS

## 2021-07-18 NOTE — Progress Notes (Signed)
?      ?                 PROGRESS NOTE ? ?      ?PATIENT DETAILS ?Name: Gina Bradley ?Age: 61 y.o. ?Sex: female ?Date of Birth: Feb 08, 1961 ?Admit Date: 06/22/2021 ?Admitting Physician Rolla Plate, DO ?[id number], Purcell Nails, MD ? ?Brief Summary: ?Patient is a 61 y.o.  female with history of DM-2, HTN, SVT, HFpEF, steroid-dependent COPD, opiate dependence, recent compression fracture at L4/right inferior pubic rami fracture-who presented to [facility] ED with altered mental status, SOB with hypoxia, SVT-she was thought to have septic shock due to PNA-she was subsequently intubated-started on broad-spectrum antibiotics/pressors and managed in the ICU.  She was subsequently transferred to [facility] ICU-she was stabilized-however she had failure to wean and underwent a tracheostomy on 3/28.  While in the ICU-she developed fever requiring ID consultation-ICU delirium/encephalopathy requiring on and off infusion of Precedex.  After stability-she was transferred to [facility] on 4/11.  See below for further details. ? ?Significant events: ?3/21>> Admit to [facility], intubated, start pressors, cardiology consult for SVT, start amiodarone ?3/24>> transfer to [facility] ?3/28>> Trach for failure to wean ?4/11>> transfer to [facility] ? ?Significant studies: ?3/21>> CT head: No acute abnormalities. ?3/21>> CTA chest: No PE, multifocal pneumonia.  Multiple subacute/chronic rib fractures. ?3/21>> CT abdomen/pelvis: No acute finding-thickened endometrium. ?3/23>> RUQ ultrasound: No gallstones ?3/25>> Echo: EF 65-70% ?3/31>> MRI brain: No acute intracranial abnormality. ?4/01>> CT chest: Extensive multifocal patchy airspace consolidations. ?4/12>> EEG: No seizures. ? ?Significant microbiology data: ?3/21>> COVID/influenza PCR: Negative ?3/21>> blood culture: No growth ?3/21>> urine culture: No growth ?3/22>> tracheal aspirate: No growth ?3/23>> urine culture: 1000 colonies/mL of Staph aureus ?3/23>> blood culture: Staph hominis (contamination) ?3/27>> tracheal  aspirate: No growth ?3/31>> blood culture 1/2: Staph epidermidis (contamination) ?4/01>> tracheal aspirate: No growth ?4/03>> blood culture: No growth ?4/05>> respiratory virus panel: Meta pneumonia virus ?4/10>> tracheal aspirate: MSSA ? ?Procedures: ?3/28>> tracheostomy ? ?Consults: ?ID, PCCM, cardiology ? ?Subjective: ?More awake/alert-speak-following commands.  Low-grade fever this morning. ? ?Objective: ?Vitals: ?Blood pressure 139/64, pulse (!) 105, temperature (!) 100.6 ?F (38.1 ?C), temperature source Axillary, resp. rate 16, height 5\' 5"  (1.651 m), weight 95.9 kg, last menstrual period 01/05/2015, SpO2 95 %.  ? ?Exam: ?Gen Exam:Alert awake-not in any distress ?HEENT:atraumatic, normocephalic ?Chest: B/L clear to auscultation anteriorly ?CVS:S1S2 regular ?Abdomen:soft non tender, non distended ?Extremities:+ edema ?Neurology: Non focal ?Skin: no rash  ? ?Pertinent Labs/Radiology: ? ?  Latest Ref Rng & Units 07/18/2021  ?  6:32 AM 07/17/2021  ? 12:37 AM 07/16/2021  ?  1:09 AM  ?CBC  ?WBC 4.0 - 10.5 K/uL 22.9   23.6   22.0    ?Hemoglobin 12.0 - 15.0 g/dL 9.1   9.4   8.2    ?Hematocrit 36.0 - 46.0 % 30.0   30.9   27.6    ?Platelets 150 - 400 K/uL 451   448   445    ?  ?Lab Results  ?Component Value Date  ? NA 134 (L) 07/18/2021  ? K 3.9 07/18/2021  ? CL 93 (L) 07/18/2021  ? CO2 31 07/18/2021  ? ?  ? ?Assessment/Plan: ?Septic shock: Sepsis physiology has resolved-however continues to have persistent leukocytosis-low-grade fever this morning.  Had completed a course of antimicrobial therapy while in the ICU-however most sputum cultures on 4/10 positive for MSSA-hence on Ancef.   ? ?Acute on chronic hypoxic/hypercapnic respiratory failure due to multifocal pneumonia: Thought to be due  to aspiration pneumonia/+/- metapneumovirus pneumonia.  Had completed a course of antimicrobial therapy while in the ICU-however sputum cultures on 4/10 positive for MSSA-and on Ancef.  Per RT-secretions are less thick-continue with  aggressive pulmonary toileting-Mucomyst nebs/hypertonic saline nebs/bronchodilators/vibrator vest.  Repeat CXR tomorrow morning given low-grade fever. ? ?Acute metabolic encephalopathy: Likely multifactorial etiology-due to PNA/ICU delirium/sepsis/polypharmacy.  Mental status continues to improve-although waxes and wanes-this morning she was trying to talk and following commands.  Continue Seroquel/primidone-dosage of Klonopin/oxycodone/Neurontin adjusted recently-for now continue with current dosing-we will continue to titrate down over the next few days.  Neuroimaging negative for acute abnormalities-EEG negative for seizures. ? ?Dysphagia: Due to severe encephalopathy/recent trach-cortak tube in place.  Thankfully encephalopathy is slowly improving-appreciate SLP reevaluation today-plans are for [facility] tomorrow.  If still unsafe for oral intake-May need to start having goals of care discussion with family regarding PEG tube placement. ? ?Transaminitis: Unclear significance-could have been from sepsis-improved-RUQ ultrasound was without acute abnormalities. ? ?COPD: Continue bronchodilators-on chronic prednisone. ? ?[id number] cardioversion on initial presentation to the ED-subsequently required amiodarone infusion-currently maintaining sinus rhythm-on beta-blocker. ? ?Acute on chronic HFpEF: Volume status stable-continue Aldactone ? ?Extremity lymphedema: Continues to have significant lower extremity edema-continue Aldactone-repeat another dose of IV Lasix today. ? ?HTN: BP stable-continue metoprolol. ? ?HLD: Continue statin ? ?DM-2 (A1c 7.5 on 3/22): No further hypoglycemic episodes-CBGs now again creeping up-add 3 units of NovoLog every 4 hours scheduled on top of SSI. ? ?Recent Labs  ?  07/17/21 ?2025 07/18/21 ?[id number] 07/18/21 ?0908  ?GLUCAP 223* 222* 259*  ? ?  ?GERD: Continue PPI ? ?Normocytic anemia: Due to acute illness-no evidence of blood loss-transfuse if Hb <7 ? ?Endometrial thickening on CT abdomen:  Will require outpatient follow-up with GYN. ? ?Opiate dependence: Have decreased oxycodone to 10 mg every 6 hours today-continue to slowly titrate down depending on mental status improvement.   ? ?Recent right pubic rami fracture: Supportive care at this point. ? ?Palliative Care: Full code in place.  Complicated patient with prolonged hospital stay-including long ICU stay-still very encephalopathic.  Has trach-NG tube in place.  Thankfully after numerous attempts at getting in touch with patient's spouse-he finally showed up in the hospital on 4/15-he has a new cell phone number that we will become active starting 4/17.  Await SLP reevaluation-if patient still is deemed not stable for oral intake-we will need goals of care discussion regarding placing PEG tube.  Appreciate palliative care input. ? ?Nutrition Status: ?Nutrition Problem: Inadequate oral intake ?Etiology: inability to eat ?Signs/Symptoms: NPO status ?Interventions: Tube feeding, Prostat ? ?Obesity ?Estimated body mass index is 35.18 kg/m? as calculated from the following: ?  Height as of this encounter: 5\' 5"  (1.651 m). ?  Weight as of this encounter: 95.9 kg.  ? ?Code status: ?  Code Status: Full Code  ? ?DVT Prophylaxis: Prophylactic Lovenox ? ?Family Communication: Spoke with the spouse yesterday evening on the floor. ? ? ?Disposition Plan: ?Status is: Inpatient ?Remains inpatient appropriate because: Septic shock-hypoxia-dysphagia on NG tube feeding-severely encephalopathic-not stable for discharge.  Per social work-[facility] transfer denied by insurance several days back. ?  ?Planned Discharge Destination: To be determined ? ? ?Diet: ?Diet Order   ? ?       ?  Diet NPO time specified  Diet effective now       ?  ? ?  ?  ? ?  ?  ? ? ?Antimicrobial agents: ?Anti-infectives (From admission, onward)  ? ? Start  Dose/Rate Route Frequency Ordered Stop  ? 07/15/21 1230  ceFAZolin (ANCEF) IVPB 2g/100 mL premix       ? 2 g ?200 mL/hr over 30 Minutes  Intravenous Every 8 hours 07/15/21 1142 07/22/21 1359  ? 07/14/21 1200  vancomycin (VANCOREADY) IVPB 1500 mg/300 mL  Status:  Discontinued       ? 1,500 mg ?150 mL/hr over 120 Minutes Intravenous Every 24 hours 0

## 2021-07-18 NOTE — Evaluation (Signed)
Clinical/Bedside Swallow Evaluation and PMV Speaking Valve Treatment ?Patient Details  ?Name: Gina Bradley ?MRN: [id number] ?Date of Birth: 1961/03/03 ? ?Today's Date: 07/18/2021 ?Time: SLP Start Time (ACUTE ONLY): [id number] SLP Stop Time (ACUTE ONLY): 0920 ?SLP Time Calculation (min) (ACUTE ONLY): 25 min ? ?Past Medical History:  ?Past Medical History:  ?Diagnosis Date  ? Anemia   ? Cervical disc disease   ? COPD (chronic obstructive pulmonary disease) (HCC)   ? Diabetes mellitus without complication (HCC)   ? Essential hypertension   ? GERD (gastroesophageal reflux disease)   ? Heart failure (HCC) 2018  ? History of cardiac catheterization   ? Normal coronaries March 2015  ? History of non-ST elevation myocardial infarction (NSTEMI)   ? Secondary to SVT  ? Lymphedema   ? Peripheral neuropathy   ? Spinal stenosis   ? SVT (supraventricular tachycardia) (HCC)   ? ?Past Surgical History:  ?Past Surgical History:  ?Procedure Laterality Date  ? CESAREAN SECTION    ? ESOPHAGOGASTRODUODENOSCOPY N/A 02/10/2014  ? RKY:[id number] tiny antral erosion; otherwise normal EGD. No explanation for patient's symptoms. Gallbladder needs further evaluation.  ? LEFT HEART CATHETERIZATION WITH CORONARY ANGIOGRAM N/A 06/03/2013  ? Procedure: LEFT HEART CATHETERIZATION WITH CORONARY ANGIOGRAM;  Surgeon: Lesleigh Noe, MD;  Location: [facility] CATH LAB;  Service: Cardiovascular;  Laterality: N/A;  ? ?HPI:  ?61 yo female former smoker presented to [facility] ER on 3/21 with AMS, fever.  Found to have multifocal pneumonia and required intubation and pressors.  Transferred to [facility] for further management. ETT 3/21; trach 3/28 secondary to failure to wean; TC trials 4/8. Dx acute metabolic encephalopathy, pmhx peripheral neuropathy, HF, HTN, DM2.  ?  ?Assessment / Plan / Recommendation  ?Clinical Impression ? [name] presents with ongoing respiratory difficulty and multifocal PNA; unresolved on CXR 07/17/21. Swallow eval began with placement of PMV. Cuff was  deflated baseline with minimal secretions. She wore valve and had adequate and intelligible voicing with use of single word phrases. Unfortunately, pt was noted with increasing RR up to 32 after about two minutes of valve use and valve was removed with significant back pressure/breath stacking noted. This does appear to be a decline in tolerance compared to session on 07/16/21. Pt was repositioned and valve was trialed once more with the same outcome: pt had strong cough that pushed valve off hub. SLP removed valve and placed in patient's cabinet in room, out of reach. ? ?Given poor tolerance of valve, PO trials were completed without valve in place. She was given single ice chips via tsp x2 with delayed cough. Half tsp trials of water resulted in delayed cough as well. Half tsp trial of applesauce had no cough noted; however, an intermittent cough occurred t/o session both with and without PO. Unable to discern if cough is related to PO at bedside. Medical team is inquiring about need for PEG, as she has had NGT in place for 26 days. Pt is highly likely to have a significant dysphagia given medical course, but severity of dysphagia and ability to consume POs will need to be determined by objective swallow study (FEES v MBSS at treating SLP discretion on Monday 07/19/21).  ? ?SLP Visit Diagnosis: Dysphagia, oropharyngeal phase (R13.12) ?   ?Aspiration Risk ? Severe aspiration risk  ?  ?Diet Recommendation NPO;Ice chips PRN after oral care;Alternative means - temporary  ? ?   ?  ?Other  Recommendations Oral Care Recommendations: Oral care QID   ? ?Recommendations for  follow up therapy are one component of a multi-disciplinary discharge planning process, led by the attending physician.  Recommendations may be updated based on patient status, additional functional criteria and insurance authorization. ? ?Follow up Recommendations SLP at Long-term acute care hospital  ? ? ?  ?Assistance Recommended at Discharge Frequent  or constant Supervision/Assistance  ?Functional Status Assessment Patient has had a recent decline in their functional status and demonstrates the ability to make significant improvements in function in a reasonable and predictable amount of time.  ?Frequency and Duration min 2x/week  ?2 weeks ?  ?   ? ?Prognosis Prognosis for Safe Diet Advancement: Fair  ? ?  ? ?Swallow Study   ?General Date of Onset: 06/27/21 ?HPI: 61 yo female former smoker presented to [facility] ER on 3/21 with AMS, fever.  Found to have multifocal pneumonia and required intubation and pressors.  Transferred to [facility] for further management. ETT 3/21; trach 3/28 secondary to failure to wean; TC trials 4/8. Dx acute metabolic encephalopathy, pmhx peripheral neuropathy, HF, HTN, DM2. ?Type of Study: Bedside Swallow Evaluation ?Previous Swallow Assessment: none ?Diet Prior to this Study: NG Tube;NPO ?Temperature Spikes Noted: No ?Respiratory Status: Trach Collar ?Behavior/Cognition: Alert;Cooperative;Requires cueing ?Oral Cavity Assessment: Within Functional Limits ?Oral Care Completed by SLP: Recent completion by staff ?Oral Cavity - Dentition: Adequate natural dentition ?Self-Feeding Abilities: Other (Comment) (in soft restraints; SLP fed) ?Patient Positioning: Upright in bed ?Baseline Vocal Quality: Low vocal intensity;Hoarse;Other (comment) (with PMV in place) ?Volitional Cough: Congested ?Volitional Swallow: Able to elicit  ?  ?Oral/Motor/Sensory Function Overall Oral Motor/Sensory Function: Generalized oral weakness   ?Ice Chips Ice chips: Impaired ?Presentation: Spoon ?Pharyngeal Phase Impairments: Cough - Delayed   ?Thin Liquid Thin Liquid: Impaired ?Presentation: Spoon ?Pharyngeal  Phase Impairments: Cough - Delayed  ?  ?Nectar Thick   DNT  ?Honey Thick   DNT  ?Puree Puree: Within functional limits ?Presentation: Spoon   ?Solid ? ?Chaney Ingram P. Franklin Baumbach, M.S., CCC-SLP ?Speech-Language Pathologist ?Acute Rehabilitation Services ?Pager: [phone] ? ?     DNT ? ?  ? ?Jacquline Terrill P Zeki Bedrosian ?07/18/2021,10:04 AM ? ? ? ?

## 2021-07-18 NOTE — Progress Notes (Signed)
Patient is alert and following commands, seen by Dr. Jerral Ralph and he wants patient to be seen by speech for evaluation. On-call speech therapist paged. ?

## 2021-07-18 NOTE — Progress Notes (Signed)
Messaged CCM re:  routine trach change/initial trach change due.  Per Dr Tacy Learn, CCM will assess pt for trach change tomorrow 4/17.   ?

## 2021-07-19 ENCOUNTER — Inpatient Hospital Stay (HOSPITAL_COMMUNITY): Payer: 59

## 2021-07-19 DIAGNOSIS — G9341 Metabolic encephalopathy: Secondary | ICD-10-CM | POA: Diagnosis not present

## 2021-07-19 DIAGNOSIS — R9389 Abnormal findings on diagnostic imaging of other specified body structures: Secondary | ICD-10-CM | POA: Diagnosis not present

## 2021-07-19 DIAGNOSIS — J9601 Acute respiratory failure with hypoxia: Secondary | ICD-10-CM | POA: Diagnosis not present

## 2021-07-19 DIAGNOSIS — J9602 Acute respiratory failure with hypercapnia: Secondary | ICD-10-CM | POA: Diagnosis not present

## 2021-07-19 DIAGNOSIS — I5032 Chronic diastolic (congestive) heart failure: Secondary | ICD-10-CM | POA: Diagnosis not present

## 2021-07-19 LAB — CBC WITH DIFFERENTIAL/PLATELET
Abs Immature Granulocytes: 2.51 10*3/uL — ABNORMAL HIGH (ref 0.00–0.07)
Basophils Absolute: 0.3 10*3/uL — ABNORMAL HIGH (ref 0.0–0.1)
Basophils Relative: 1 %
Eosinophils Absolute: 0.5 10*3/uL (ref 0.0–0.5)
Eosinophils Relative: 2 %
HCT: 30.4 % — ABNORMAL LOW (ref 36.0–46.0)
Hemoglobin: 9.8 g/dL — ABNORMAL LOW (ref 12.0–15.0)
Immature Granulocytes: 10 %
Lymphocytes Relative: 20 %
Lymphs Abs: 4.9 10*3/uL — ABNORMAL HIGH (ref 0.7–4.0)
MCH: 31.3 pg (ref 26.0–34.0)
MCHC: 32.2 g/dL (ref 30.0–36.0)
MCV: 97.1 fL (ref 80.0–100.0)
Monocytes Absolute: 2.2 10*3/uL — ABNORMAL HIGH (ref 0.1–1.0)
Monocytes Relative: 9 %
Neutro Abs: 14.4 10*3/uL — ABNORMAL HIGH (ref 1.7–7.7)
Neutrophils Relative %: 58 %
Platelets: 512 10*3/uL — ABNORMAL HIGH (ref 150–400)
RBC: 3.13 MIL/uL — ABNORMAL LOW (ref 3.87–5.11)
RDW: 19.7 % — ABNORMAL HIGH (ref 11.5–15.5)
Smear Review: NORMAL
WBC: 24.8 10*3/uL — ABNORMAL HIGH (ref 4.0–10.5)
nRBC: 0.8 % — ABNORMAL HIGH (ref 0.0–0.2)

## 2021-07-19 LAB — GLUCOSE, CAPILLARY
Glucose-Capillary: 115 mg/dL — ABNORMAL HIGH (ref 70–99)
Glucose-Capillary: 233 mg/dL — ABNORMAL HIGH (ref 70–99)
Glucose-Capillary: 295 mg/dL — ABNORMAL HIGH (ref 70–99)
Glucose-Capillary: 426 mg/dL — ABNORMAL HIGH (ref 70–99)
Glucose-Capillary: 190 mg/dL — ABNORMAL HIGH (ref 70–99)

## 2021-07-19 LAB — BASIC METABOLIC PANEL
Anion gap: 12 (ref 5–15)
BUN: 20 mg/dL (ref 6–20)
CO2: 29 mmol/L (ref 22–32)
Calcium: 9.3 mg/dL (ref 8.9–10.3)
Chloride: 92 mmol/L — ABNORMAL LOW (ref 98–111)
Creatinine, Ser: 0.62 mg/dL (ref 0.44–1.00)
GFR, Estimated: >60 mL/min (ref >60)
Glucose, Bld: 245 mg/dL — ABNORMAL HIGH (ref 70–99)
Potassium: 3.8 mmol/L (ref 3.5–5.1)
Sodium: 133 mmol/L — ABNORMAL LOW (ref 135–145)

## 2021-07-19 MED ORDER — PREDNISONE 5 MG PO TABS
5.0000 mg | ORAL_TABLET | Freq: Every day | ORAL | Status: DC
  Administered 2021-07-20 – 2021-07-24 (×5): 5 mg
  Filled 2021-07-19 (×5): qty 1

## 2021-07-19 MED ORDER — OXYCODONE HCL 5 MG PO TABS
10.0000 mg | ORAL_TABLET | Freq: Three times a day (TID) | ORAL | Status: DC
  Administered 2021-07-19 – 2021-07-24 (×17): 10 mg
  Filled 2021-07-19 (×17): qty 2

## 2021-07-19 MED ORDER — CLONAZEPAM 0.5 MG PO TABS
0.5000 mg | ORAL_TABLET | Freq: Two times a day (BID) | ORAL | Status: DC | PRN
  Administered 2021-07-20 – 2021-07-24 (×4): 0.5 mg
  Filled 2021-07-19 (×5): qty 1

## 2021-07-19 MED ORDER — CLONAZEPAM 0.1 MG/ML ORAL SUSPENSION
0.5000 mg | Freq: Two times a day (BID) | ORAL | Status: DC | PRN
  Filled 2021-07-19: qty 5

## 2021-07-19 MED ORDER — CLONAZEPAM 0.5 MG PO TABS
0.5000 mg | ORAL_TABLET | Freq: Two times a day (BID) | ORAL | Status: DC
  Administered 2021-07-19 – 2021-07-23 (×8): 0.5 mg
  Filled 2021-07-19 (×7): qty 1

## 2021-07-19 MED ORDER — SODIUM CHLORIDE 0.9% FLUSH
10.0000 mL | INTRAVENOUS | Status: DC | PRN
  Administered 2021-07-24 – 2021-08-03 (×3): 10 mL

## 2021-07-19 MED ORDER — BUDESONIDE 0.5 MG/2ML IN SUSP
0.5000 mg | Freq: Two times a day (BID) | RESPIRATORY_TRACT | Status: DC
  Administered 2021-07-19 – 2021-08-04 (×32): 0.5 mg via RESPIRATORY_TRACT
  Filled 2021-07-19 (×34): qty 2

## 2021-07-19 MED ORDER — SODIUM CHLORIDE 0.9% FLUSH
10.0000 mL | Freq: Two times a day (BID) | INTRAVENOUS | Status: DC
  Administered 2021-07-19 – 2021-07-23 (×7): 10 mL

## 2021-07-19 NOTE — Progress Notes (Signed)
Speech Language Pathology Treatment: Gina Bradley Speaking valve  ?Patient Details ?Name: Gina Bradley ?MRN: [id number] ?DOB: 19-Sep-1960 ?Today's Date: 07/19/2021 ?Time: 8341-9622 ?SLP Time Calculation (min) (ACUTE ONLY): 10 min ? ?Assessment / Plan / Recommendation ?Clinical Impression ? Brief treatment prior to pt being bathed. Her trach was downsized this afternoon to a #6 cuffless.  She demonstrated much improved toleration of PMV with no air trapping, better volume, stable VS. She required mod verbal cues to phonate instead of just gesturing or mouthing words. Speech was intelligible but confused.  She is in a much better position now to proceed with an instrumental swallow study - I doubt she will remain still to allow an endoscope to be passed for FEES; MBS will likely be necessary.  Will schedule ASAP to determine potential for any PO intake. ? ?  ?HPI HPI: 61 yo female former smoker presented to [facility] ER on 3/21 with AMS, fever.  Found to have multifocal pneumonia and required intubation and pressors.  Transferred to [facility] for further management. ETT 3/21; trach 3/28 secondary to failure to wean; TC trials 4/8. Dx acute metabolic encephalopathy, pmhx peripheral neuropathy, HF, HTN, DM2. ?  ?   ?SLP Plan ? Continue with current plan of care ? ?  ?  ?Recommendations for follow up therapy are one component of a multi-disciplinary discharge planning process, led by the attending physician.  Recommendations may be updated based on patient status, additional functional criteria and insurance authorization. ?  ? ?Recommendations  ?Diet recommendations: NPO  ?   ? Patient may use Passy-Muir Speech Valve: Intermittently with supervision ?PMSV Supervision: Full  ?   ? ? ? ? Oral Care Recommendations: Oral care QID ?Follow Up Recommendations: SLP at Long-term acute care hospital ?Assistance recommended at discharge: Frequent or constant Supervision/Assistance ?SLP Visit Diagnosis: Aphonia (R49.1) ?Plan: Continue with  current plan of care ? ? ? ? ?  ?  ?Gina Bradley L. Heidee Audi, MA CCC/SLP ?Acute Rehabilitation Services ?Office number [phone] ?Pager [phone] ? ? ?[name] ? ?07/19/2021, 4:47 PM ?

## 2021-07-19 NOTE — Plan of Care (Signed)

## 2021-07-19 NOTE — Progress Notes (Signed)
Palliative-  ? ?Attempted to call spouse at mobile number listed in chart- per report he was to have working phone today. Phone rang several times then disconnected with  no answer.  ?Please update demographics with new phone number if there is one.  ? ?PMT will continue to try and reach spouse.  ? ?Ocie Bob, AGNP-C ?Palliative Medicine ? ?No charge ?

## 2021-07-19 NOTE — Progress Notes (Signed)
Inpatient Diabetes Program Recommendations ? ?AACE/ADA: New Consensus Statement on Inpatient Glycemic Control (2015) ? ?Target Ranges:  Prepandial:   less than 140 mg/dL ?     Peak postprandial:   less than 180 mg/dL (1-2 hours) ?     Critically ill patients:  140 - 180 mg/dL  ? ?Lab Results  ?Component Value Date  ? GLUCAP 233 (H) 07/19/2021  ? HGBA1C 7.5 (H) 06/23/2021  ? ? ?Review of Glycemic Control ? ?Diabetes history: DM2 ?Outpatient Diabetes medications: Amaryl 4 mg QD ?Current orders for Inpatient glycemic control: Novolog 0-9 units Q4H + 3 units Q4H, Pred 10 mg QAM ? ?HgbA1C - 7.5% ?FBS > 200 x past 3 days. ? ?Inpatient Diabetes Program Recommendations:   ? ?Consider adding Semglee 10 units QD ? ?Will follow glucose trends. ? ?Thank you. ?Gina Bradley, RD, LDN, CDE ?Inpatient Diabetes Coordinator ?[phone]  ? ? ? ? ? ? ? ? ?

## 2021-07-19 NOTE — Progress Notes (Addendum)
?      ?                 PROGRESS NOTE ? ?      ?PATIENT DETAILS ?Name: Gina Bradley ?Age: 61 y.o. ?Sex: female ?Date of Birth: Aug 01, 1960 ?Admit Date: 06/22/2021 ?Admitting Physician Rolla Plate, DO ?[id number], Purcell Nails, MD ? ?Brief Summary: ?Patient is a 61 y.o.  female with history of DM-2, HTN, SVT, HFpEF, steroid-dependent COPD, opiate dependence, recent compression fracture at L4/right inferior pubic rami fracture-who presented to [facility] ED with altered mental status, SOB with hypoxia, SVT-she was thought to have septic shock due to PNA-she was subsequently intubated-started on broad-spectrum antibiotics/pressors and managed in the ICU.  She was subsequently transferred to [facility] ICU-she was stabilized-however she had failure to wean and underwent a tracheostomy on 3/28.  While in the ICU-she developed fever requiring ID consultation-ICU delirium/encephalopathy requiring on and off infusion of Precedex.  After stability-she was transferred to [facility] on 4/11.  See below for further details. ? ?Significant events: ?3/21>> Admit to [facility], intubated, start pressors, cardiology consult for SVT, start amiodarone ?3/24>> transfer to [facility] ?3/28>> Trach for failure to wean ?4/11>> transfer to [facility] ?4/17>> trach downsized to #6 cuffless ? ?Significant studies: ?3/21>> CT head: No acute abnormalities. ?3/21>> CTA chest: No PE, multifocal pneumonia.  Multiple subacute/chronic rib fractures. ?3/21>> CT abdomen/pelvis: No acute finding-thickened endometrium. ?3/23>> RUQ ultrasound: No gallstones ?3/25>> Echo: EF 65-70% ?3/31>> MRI brain: No acute intracranial abnormality. ?4/01>> CT chest: Extensive multifocal patchy airspace consolidations. ?4/12>> EEG: No seizures. ? ?Significant microbiology data: ?3/21>> COVID/influenza PCR: Negative ?3/21>> blood culture: No growth ?3/21>> urine culture: No growth ?3/22>> tracheal aspirate: No growth ?3/23>> urine culture: 1000 colonies/mL of Staph aureus ?3/23>> blood culture: Staph  hominis (contamination) ?3/27>> tracheal aspirate: No growth ?3/31>> blood culture 1/2: Staph epidermidis (contamination) ?4/01>> tracheal aspirate: No growth ?4/03>> blood culture: No growth ?4/05>> respiratory virus panel: Meta pneumonia virus ?4/10>> tracheal aspirate: MSSA ? ?Antimicrobial therapy: ?Doxycyline 3/22 >> 3/24 ?Ceftriaxone 3/21 >>3/25 ?Azithromycin 3/21 x1 dose ?Vancomycin 3/24 >>3/25; 4/1>> 4/4, 4/10 >>4/13 ?Zosyn 3/25 >> 3/28 ?Cefepime 3/31>> 4/4 ?Diflucan 4/1>> 4/4 ?Cefazolin 4/13>> ? ?Procedures: ?3/28>> tracheostomy ? ?Consults: ?ID, PCCM, cardiology ? ?Subjective: ?Remains essentially unchanged-opens eyes-attempts to talk at times.  Follows simple commands.  Per nursing staff-still has waxing/waning agitation where she attempts to pull out her lines. ? ?Objective: ?Vitals: ?Blood pressure 129/73, pulse 95, temperature 97.9 ?F (36.6 ?C), temperature source Oral, resp. rate 20, height 5\' 5"  (1.651 m), weight 95.9 kg, last menstrual period 01/05/2015, SpO2 97 %.  ? ?Exam: ?Gen Exam:Alert awake-not in any distress ?HEENT:atraumatic, normocephalic ?Chest: B/L clear to auscultation anteriorly ?CVS:S1S2 regular ?Abdomen:soft non tender, non distended ?Extremities:+ edema ?Neurology: Non focal ?Skin: no rash  ? ?Pertinent Labs/Radiology: ? ?  Latest Ref Rng & Units 07/19/2021  ?  7:01 AM 07/18/2021  ?  6:32 AM 07/17/2021  ? 12:37 AM  ?CBC  ?WBC 4.0 - 10.5 K/uL 24.8   22.9   23.6    ?Hemoglobin 12.0 - 15.0 g/dL 9.8   9.1   9.4    ?Hematocrit 36.0 - 46.0 % 30.4   30.0   30.9    ?Platelets 150 - 400 K/uL 512   451   448    ?  ?Lab Results  ?Component Value Date  ? NA 133 (L) 07/19/2021  ? K 3.8 07/19/2021  ? CL 92 (L) 07/19/2021  ? CO2 29 07/19/2021  ? ?  ? ?  Assessment/Plan: ?Septic shock: Sepsis physiology has resolved-however continues to have persistent leukocytosis-low-grade fever this morning.  Had completed a course of antimicrobial therapy while in the ICU-however most sputum cultures on 4/10  positive for MSSA-hence on Ancef.   ? ?Acute on chronic hypoxic/hypercapnic respiratory failure due to multifocal pneumonia-s/p tracheostomy: Thought to be due to aspiration pneumonia/+/- metapneumovirus pneumonia.  Had completed a course of antimicrobial therapy while in the ICU-however sputum cultures on 4/10 positive for MSSA-and on Ancef.  Per RT-secretions are less thick-continue with aggressive pulmonary toileting-Mucomyst nebs/hypertonic saline nebs/bronchodilators/vibrator vest.  PCCM following and managing trach care-downgraded to #6 cuffless on 4/17. ? ?Acute metabolic encephalopathy: Likely multifactorial etiology-due to PNA/ICU delirium/sepsis/polypharmacy.  Mental status continues to improve-although waxes and wanes-this morning she was trying to talk and following commands.  Continue Seroquel/primidone-have decreased doses of Klonopin and oxycodone today.  Continue to optimize over the next few days.  Neuroimaging negative for acute abnormalities-EEG negative for seizures. ? ?Dysphagia: Due to severe encephalopathy/recent trach-cortak tube in place.  Thankfully encephalopathy is slowly improving-SLP following-reevaluation including MBS scheduled for today-we will await further recommendations.  Suspect will probably require insertion of PEG tube-however will await SLP reevaluation before proceeding. ? ?Transaminitis: Unclear significance-could have been from sepsis-improved-RUQ ultrasound was without acute abnormalities. ? ?COPD: Continue bronchodilators-on chronic prednisone. ? ?[id number] cardioversion on initial presentation to the ED-subsequently required amiodarone infusion-currently maintaining sinus rhythm-on beta-blocker. ? ?Acute on chronic HFpEF: Volume status stable-continue Aldactone-has required several doses of IV Lasix. ? ?Bilateral extremity lymphedema: Continues to have significant lower extremity edema-continue Aldactone -has required several doses of as needed IV Lasix. ? ?HTN:  BP stable-continue metoprolol. ? ?HLD: Continue statin ? ?DM-2 (A1c 7.5 on 3/22): No further hypoglycemic episodes-CBGs stable-continue SSI-scheduled NovoLog-reassess on 4/18.. ? ?Recent Labs  ?  07/19/21 ?0336 07/19/21 ?0738 07/19/21 ?1153  ?GLUCAP 115* 233* 295*  ? ?  ?GERD: Continue PPI ? ?Normocytic anemia: Due to acute illness-no evidence of blood loss-transfuse if Hb <7 ? ?Endometrial thickening on CT abdomen: Will require outpatient follow-up with GYN. ? ?Opiate dependence: Have decreased oxycodone to 10 mg every 6 hours today-continue to slowly titrate down depending on mental status improvement.   ? ?Recent right pubic rami fracture: Supportive care at this point. ? ?Palliative Care: Full code in place.  Complicated patient with prolonged hospital stay-including long ICU stay-still very encephalopathic.  Has trach-NG tube in place.  Thankfully after numerous attempts at getting in touch with patient's spouse-he finally showed up in the hospital on 4/15-he has a new cell phone number that we will become active starting 4/17.  Await SLP reevaluation-if patient still is deemed not stable for oral intake-we will need goals of care discussion regarding placing PEG tube.  Appreciate palliative care input. ? ?Nutrition Status: ?Nutrition Problem: Inadequate oral intake ?Etiology: inability to eat ?Signs/Symptoms: NPO status ?Interventions: Tube feeding, Prostat ? ?Obesity ?Estimated body mass index is 35.18 kg/m? as calculated from the following: ?  Height as of this encounter: 5\' 5"  (1.651 m). ?  Weight as of this encounter: 95.9 kg.  ? ?Code status: ?  Code Status: Full Code  ? ?DVT Prophylaxis: Prophylactic Lovenox ? ?Family Communication: Spouse-[phone] called-no response-on 4/17. ? ? ?Disposition Plan: ?Status is: Inpatient ?Remains inpatient appropriate because: Septic shock-hypoxia-dysphagia on NG tube feeding-severely encephalopathic-not stable for discharge.  Per social work-[facility] transfer denied  by insurance several days back. ?  ?Planned Discharge Destination: To be determined ? ? ?Diet: ?Diet Order   ? ?       ?  Diet NPO time specified  Diet effective now       ?  ? ?  ?  ? ?  ?  ? ? ?Antimicrobial

## 2021-07-19 NOTE — Progress Notes (Signed)
? ?  NAME:  Gina Bradley, MRN:  [id number], DOB:  [birth year], LOS: 34 ?ADMISSION DATE:  06/22/2021, CONSULTATION DATE:  3/21 ?REFERRING MD:  Wynetta Emery, [facility], CHIEF COMPLAINT:  acute resp failure/ fever wth AMS and SVT ? ?History of Present Illness:  ?61 y/o female former smoker presented to [facility] ER on 3/21 with AMS, fever.  Found to have multifocal pneumonia and required intubation and pressors.  Transferred to [facility] for further management. ? ?Pertinent  Medical History  ?Anemia, Back pain, Spinal stenosis COPD, DM type 2, HTN, GERD, CHF, Lymphedema, Neuropathy, SVT ? ?Significant Hospital Events: ?Including procedures, antibiotic start and stop dates in addition to other pertinent events   ?3/21 Admit to [facility], intubated, start pressors, cardiology consult for SVT, start amiodarone ?3/24 transfer to [facility] ?3/28 Trach for failure to wean ?3/31 Fever, hypotension, worsening leukocytosis. Started on levophed and antibiotics. ?4/2 PICC removed for line holiday; ID following ?4/4 off precedex;and fentanyl; ID plan to stop cefepime and vanc ?4/5 remains off precedex and fentanyl; failed PSV early in morning ?4/6 remains off continuous sedation; failed PSV this morning due to desaturation ?4/9 Tolerated being off the vent for about 8 hours yesterday 4/8 ?4/10 WBC elevated, copious thick yellow secretions, restless / agitated  ?4/17 #8 cuffed trach, 28% ATC. Planned trach change to #6 cuffless ? ?Interim History / Subjective:  ?Afebrile  ?On 28% ATC  ?Denies acute complaints  ? ?Objective   ?Blood pressure 130/80, pulse 100, temperature 98.7 ?F (37.1 ?C), temperature source Oral, resp. rate (!) 21, height 5\' 5"  (1.651 m), weight 95.9 kg, last menstrual period 01/05/2015, SpO2 100 %. ?   ?FiO2 (%):  [28 %] 28 %  ?No intake or output data in the 24 hours ending 07/19/21 0758 ? ?Filed Weights  ? 07/15/21 0500 07/17/21 0500 07/18/21 0426  ?Weight: 97.6 kg 94.7 kg 95.9 kg  ? ?Physical Exam: ?General: adult female sitting up in bed in  NAD ?HEENT: MM pink/moist, anicteric, #8 trach midline c/d/i ?Neuro: awake/alert, able to communicate needs / mouth words ?CV: s1s2 RRR, no m/r/g ?PULM: non-labored at rest, lungs bilaterally with coarse rhonchi  ?GI: soft, bsx4 active  ?Extremities: warm/dry, no edema  ?Skin: no rashes or lesions ? ? ?Resolved Problems:  ?Septic shock, Elevated troponin from demand ischemia ?Urinary retention ? ?Assessment & Plan:  ? ?Acute on Chronic Hypoxic / Hypercapnic Respiratory Failure secondary to Multifocal Aspiration PNA, Metapneumovirus, AECOPD, and Metabolic Encephalopathy s/p Tracheostomy  ?Trach placed 3/28 for failure to vent wean.   ?-ATC as tolerated for sats >90% ?-trach care per protocol  ?-change trach to #6 cuffless  ?-pulmonary hygiene as able - chest PT, upright positioning  ?-intermittent CXR  ?-stop mucomyst ?-continue brovana, pulmicort, yupelri  ?-PRN albuterol  ?-continue baseline prednisone 10mg  QD ?-diuresis as renal function / BP permit  ? ?PCCM will see weekly for trach care / progression.  All other medical issues per [facility].  Please call sooner if new needs arise.  ? ?Best Practice (right click and "Reselect all SmartList Selections" daily)  ?Diet/type: tubefeeds ?DVT prophylaxis: LMWH ?GI prophylaxis: PPI ?Lines: N/A ?Foley:  N/A ?Code Status:  full code ?Last date of multidisciplinary goals of care discussion. Per [facility]  ? ?  ? ?Noe Gens, MSN, APRN, NP-C, AGACNP-BC ?[facility] ?07/19/2021, 7:58 AM ? ? ?Please see [email or web address] for pager details.  ? ?From 7A-7P if no response, please call [phone] ?After hours, please call Warren Lacy [phone] ? ? ?

## 2021-07-19 NOTE — Progress Notes (Signed)

## 2021-07-20 ENCOUNTER — Inpatient Hospital Stay (HOSPITAL_COMMUNITY): Payer: 59

## 2021-07-20 DIAGNOSIS — G9341 Metabolic encephalopathy: Secondary | ICD-10-CM | POA: Diagnosis not present

## 2021-07-20 DIAGNOSIS — I5032 Chronic diastolic (congestive) heart failure: Secondary | ICD-10-CM | POA: Diagnosis not present

## 2021-07-20 DIAGNOSIS — R9389 Abnormal findings on diagnostic imaging of other specified body structures: Secondary | ICD-10-CM | POA: Diagnosis not present

## 2021-07-20 DIAGNOSIS — J9601 Acute respiratory failure with hypoxia: Secondary | ICD-10-CM | POA: Diagnosis not present

## 2021-07-20 LAB — BASIC METABOLIC PANEL
Anion gap: 12 (ref 5–15)
BUN: 18 mg/dL (ref 6–20)
CO2: 29 mmol/L (ref 22–32)
Calcium: 9.1 mg/dL (ref 8.9–10.3)
Chloride: 92 mmol/L — ABNORMAL LOW (ref 98–111)
Creatinine, Ser: 0.56 mg/dL (ref 0.44–1.00)
GFR, Estimated: >60 mL/min (ref >60)
Glucose, Bld: 201 mg/dL — ABNORMAL HIGH (ref 70–99)
Potassium: 3.5 mmol/L (ref 3.5–5.1)
Sodium: 133 mmol/L — ABNORMAL LOW (ref 135–145)

## 2021-07-20 LAB — CBC
HCT: 30 % — ABNORMAL LOW (ref 36.0–46.0)
Hemoglobin: 9.5 g/dL — ABNORMAL LOW (ref 12.0–15.0)
MCH: 31.5 pg (ref 26.0–34.0)
MCHC: 31.7 g/dL (ref 30.0–36.0)
MCV: 99.3 fL (ref 80.0–100.0)
Platelets: 496 10*3/uL — ABNORMAL HIGH (ref 150–400)
RBC: 3.02 MIL/uL — ABNORMAL LOW (ref 3.87–5.11)
RDW: 19.7 % — ABNORMAL HIGH (ref 11.5–15.5)
WBC: 24.8 10*3/uL — ABNORMAL HIGH (ref 4.0–10.5)
nRBC: 0.6 % — ABNORMAL HIGH (ref 0.0–0.2)

## 2021-07-20 LAB — GLUCOSE, CAPILLARY
Glucose-Capillary: 167 mg/dL — ABNORMAL HIGH (ref 70–99)
Glucose-Capillary: 186 mg/dL — ABNORMAL HIGH (ref 70–99)
Glucose-Capillary: 222 mg/dL — ABNORMAL HIGH (ref 70–99)
Glucose-Capillary: 230 mg/dL — ABNORMAL HIGH (ref 70–99)
Glucose-Capillary: 231 mg/dL — ABNORMAL HIGH (ref 70–99)
Glucose-Capillary: 287 mg/dL — ABNORMAL HIGH (ref 70–99)

## 2021-07-20 MED ORDER — FREE WATER
150.0000 mL | Status: DC
  Administered 2021-07-20 – 2021-07-22 (×11): 150 mL

## 2021-07-20 MED ORDER — MORPHINE SULFATE (PF) 2 MG/ML IV SOLN
2.0000 mg | Freq: Once | INTRAVENOUS | Status: AC
  Administered 2021-07-20: 2 mg via INTRAVENOUS
  Filled 2021-07-20: qty 1

## 2021-07-20 MED ORDER — JEVITY 1.5 CAL/FIBER PO LIQD
1000.0000 mL | ORAL | Status: DC
  Administered 2021-07-20 – 2021-07-22 (×2): 1000 mL
  Filled 2021-07-20 (×4): qty 1000

## 2021-07-20 MED ORDER — PROSOURCE TF PO LIQD
45.0000 mL | Freq: Every day | ORAL | Status: DC
  Administered 2021-07-21 – 2021-07-25 (×4): 45 mL
  Filled 2021-07-20 (×4): qty 45

## 2021-07-20 NOTE — Plan of Care (Signed)

## 2021-07-20 NOTE — Progress Notes (Signed)
Nutrition Follow-up ? ?DOCUMENTATION CODES:  ? ?Obesity unspecified ? ?INTERVENTION:  ? ?Continue tube feeds via Cortrak: ?Transition to Jevity 1.5 @ 55 mL/hr (1320 mL/day) ?45 mL ProSource TF - Daily ?150 mL free water flush g4h ?Tube feeding regimen provides 2020 kcal, 95 grams of protein, and 1003 mL of free water (1903 mL total free water) daily. ?  ?NUTRITION DIAGNOSIS:  ? ?Inadequate oral intake related to inability to eat as evidenced by NPO status. - Ongoing, being addressed via TF ? ?GOAL:  ? ?Patient will meet greater than or equal to 90% of their needs - Met via TF ? ?MONITOR:  ? ?PO intake, Labs, Weight trends ? ?REASON FOR ASSESSMENT:  ? ?Ventilator ?  ? ?ASSESSMENT:  ? ?61 year old female with PMH of T2DM, CHF, COPD, GERD, chronic lymphedema who presented with AMS and required intubation. ? ?03/24 - transferred to [facility] ?03/28 - s/p tracheostomy ?03/29 - Cortrak placed (tip gastric) ?04/17 - MBS w/ SLP, recommend NPO ?04/18 - MBS w/ SLP, recommend Dysphagia 1, thin liquids ? ?Per MD note, pt mentation has improved significantly.   ? ?Pt resting in bed, reports that she is ok. ?Pt reports that she has had some nausea and vomiting today. Discussed that she passed her MBS and diet was advanced. RD discussed the tube will remain in place until pt is able to eat sufficiently by mouth; recommend >60% of estimated needs.  ? ?Discussed with RN outside of room with plan to continue feedings via Cortrak until pt is able to eat sufficiently by mouth.  ? ?Medications reviewed and include: SSI 0-9 units q4h + 3 units q4h, Oxycodone, Protonix, Prednisone, Senokot, Spironolactone, IV antibiotics ?Labs reviewed: Sodium 133 ? ?UOP: 2350 ml x 24 hours ?I/O's: +11.4 L since admit ? ?Diet Order:   ?Diet Order   ? ?       ?  DIET - DYS 1 Room service appropriate? Yes; Fluid consistency: Thin  Diet effective now       ?  ? ?  ?  ? ?  ? ?EDUCATION NEEDS:  ? ?Not appropriate for education at this time ? ?Skin:  Skin Assessment:  Reviewed RN Assessment ? ?Last BM:  4/15 ? ?Height:  ?Ht Readings from Last 1 Encounters:  ?06/30/21 5' 5"  (1.651 m)  ? ?Weight: ?Wt Readings from Last 1 Encounters:  ?07/18/21 95.9 kg  ? ?Ideal Body Weight:  56.8 kg ? ?BMI:  Body mass index is 35.18 kg/m?. ? ?Estimated Nutritional Needs:  ? ?Kcal:  1900-2100 ? ?Protein:  95-110 grams ? ?Fluid:  >/= 1.9 L ? ? ?Hermina Barters RD, LDN ?Clinical Dietitian ?See AMiON for contact information.  ?

## 2021-07-20 NOTE — Progress Notes (Signed)
Patient assessed for restraint need. She is A&Ox1 and continues to be restless and agitated. Dr. Sloan Leiter notified of assessment and risk of pulling necessary medical devices/treatments - restraints renewed per MD. Call bell within reach, bed low and locked position. ?

## 2021-07-20 NOTE — Progress Notes (Signed)
Respiratory called and stated that pt has been pulling at trach colloar she is concerned that she will pull out trach, went by room pt was pulling at [facility]. Per day shift nurse plan was to trial her without restraints but agitation seems to increase at night. Provider Julian Reil aware see new order. Will continue to monitor , call bell within reach. ?

## 2021-07-20 NOTE — Progress Notes (Signed)
Modified Barium Swallow Progress Note ? ?Patient Details  ?Name: Gina Bradley ?MRN: [id number] ?Date of Birth: Nov 17, 1960 ? ?Today's Date: 07/20/2021 ? ?Modified Barium Swallow completed.  Full report located under Chart Review in the Imaging Section. ? ?Brief recommendations include the following: ? ?Clinical Impression ? Patient presents with a moderate oral dysphagia with suspected cognitive impact and a mild pharyngeal dysphagia. Oral phase is characterized by decreased lingual manipulation and transit of all boluses resulting in delayed anterior to posterior transit of bolus as well as bolus moving from anterior to posterior then back to anterior portion of oral cavity. Trace amount of premature spillage from oral cavity into vallecular sinus. No significant oral residuals of barium post initial swallows. During pharyngeal phase of swallow patient exhibited delayed swallow initiation at level of vallecular sinus with thin liquids and puree solids. At some point in test, PMV had become dislodged. (SLP noticed when about to test patient's swallow function with PMV off. She exibited one instance of flash penetration with PMV off but with full clearance from laryngeal vestibule and no instances of aspiration observed even with straw sips of thin liquids. No pharyngeal residuals observed post initial swallows. SLP is recommending initiate Dys 1, thin liquids at this time. ?  ?Swallow Evaluation Recommendations ? ?   ? ? SLP Diet Recommendations: Dysphagia 1 (Puree) solids;Thin liquid ? ? Liquid Administration via: Straw;Cup ? ? Medication Administration: Whole meds with puree ? ? Supervision: Staff to assist with self feeding;Full assist for feeding;Full supervision/cueing for compensatory strategies ? ? Compensations: Slow rate;Small sips/bites;Minimize environmental distractions ? ? Postural Changes: Seated upright at 90 degrees ? ? Oral Care Recommendations: Oral care BID;Staff/trained caregiver to provide oral  care ? ?   ? ? ? ?Sonia Baller, MA, CCC-SLP ?Speech Therapy ? ?

## 2021-07-20 NOTE — Progress Notes (Signed)
SLP: Pt needs a new Shelbie Hutching old one fell in BM. Thanks Darl Pikes ?

## 2021-07-20 NOTE — Progress Notes (Signed)
Physical Therapy Treatment ?Patient Details ?Name: Gina Bradley ?MRN: [id number] ?DOB: 1960-07-06 ?Today's Date: 07/20/2021 ? ? ?History of Present Illness 61 yo female former smoker presented to [facility] ER on 3/21 with AMS, fever.  Found to have multifocal pneumonia and required intubation and pressors.  Transferred to [facility] 06/25/21 for treatment of acute on chronic hypoxic/hypercapnic respiratory failure from PNA, COPD with acute COPD, paroxysmal SVT, acute on chronic diastolic CHF, and acute metabolic encephalopathy in presence of peripheral neuropathy, back pain and chronic opiate use. PMH: CAD, smoker, heart failure, GERD, type 2 DM, hypertension, SVT, peripheral neuropathy, steroid dependent COPD, chronic pain history of DVT, chronic lymphedema, spinal stenosis, was recently diagnosed with compression deformity of the L4 vertebrae and subacute fracture of the right inferior pubic rami seen in the ED on 06/06/2021 after fall. ? ?  ?PT Comments  ? ? Patient alert and able to follow single step commands for multiple leg exercises! Speaking around her trach and most comments on target and making sense.    ?Recommendations for follow up therapy are one component of a multi-disciplinary discharge planning process, led by the attending physician.  Recommendations may be updated based on patient status, additional functional criteria and insurance authorization. ? ?Follow Up Recommendations ? PT at Long-term acute care hospital ?  ?  ?Assistance Recommended at Discharge Frequent or constant Supervision/Assistance  ?Patient can return home with the following Two people to help with walking and/or transfers;A lot of help with bathing/dressing/bathroom;Assistance with cooking/housework;Assistance with feeding;Direct supervision/assist for medications management;Direct supervision/assist for financial management;Assist for transportation;Help with stairs or ramp for entrance ?  ?Equipment Recommendations ? BSC/3in1;Wheelchair  (measurements PT);Wheelchair cushion (measurements PT);Hospital bed;Other (comment) (hoyer lift)  ?  ?Recommendations for Other Services   ? ? ?  ?Precautions / Restrictions Precautions ?Precautions: Fall ?Precaution Comments: hx of fall with L4 compression fx ?Restrictions ?Weight Bearing Restrictions: No  ?  ? ?Mobility ? Bed Mobility ?  ?  ?  ?  ?  ?  ?  ?  ?  ? ?Transfers ?  ?  ?  ?  ?  ?  ?  ?  ?  ?  ?  ? ?Ambulation/Gait ?  ?  ?  ?  ?  ?  ?  ?  ? ? ?Stairs ?  ?  ?  ?  ?  ? ? ?Wheelchair Mobility ?  ? ?Modified Rankin (Stroke Patients Only) ?  ? ? ?  ?Balance   ?  ?  ?  ?  ?  ?  ?  ?  ?  ?  ?  ?  ?  ?  ?  ?  ?  ?  ?  ? ?  ?Cognition Arousal/Alertness: Awake/alert ?Behavior During Therapy: Restless ?Overall Cognitive Status: Impaired/Different from baseline ?Area of Impairment: Orientation, Attention, Following commands, Awareness ?  ?  ?  ?  ?  ?  ?  ?  ?Orientation Level: Time, Situation ?Current Attention Level: Sustained ?  ?Following Commands: Follows one step commands with increased time ?  ?Awareness: Intellectual ?  ?General Comments: following 1 step commands repeatedly for LE exercises ?  ?  ? ?  ?Exercises General Exercises - Lower Extremity ?Ankle Circles/Pumps: AROM, Both, 10 reps ?Short Arc Quad: Both, AROM, 10 reps ?Heel Slides: Both, Supine, AROM, Strengthening, 5 reps (resisted extension) ?Hip ABduction/ADduction: Both, Supine, AAROM, 10 reps ? ?  ?General Comments   ?  ?  ? ?Pertinent Vitals/Pain Pain Assessment ?Pain Assessment:  Faces ?Faces Pain Scale: No hurt  ? ? ?Home Living   ?  ?  ?  ?  ?  ?  ?  ?  ?  ?   ?  ?Prior Function    ?  ?  ?   ? ?PT Goals (current goals can now be found in the care plan section) Acute Rehab PT Goals ?Patient Stated Goal: wants to stand ?PT Goal Formulation: Patient unable to participate in goal setting ?Time For Goal Achievement: 08/03/21 ?Potential to Achieve Goals: Fair ?Progress towards PT goals:  (goals updated based on timeframe) ? ?  ?Frequency ? ? ? Min  2X/week ? ? ? ?  ?PT Plan Current plan remains appropriate  ? ? ?Co-evaluation   ?  ?  ?  ?  ? ?  ?AM-PAC PT "6 Clicks" Mobility   ?Outcome Measure ? Help needed turning from your back to your side while in a flat bed without using bedrails?: Total ?Help needed moving from lying on your back to sitting on the side of a flat bed without using bedrails?: Total ?Help needed moving to and from a bed to a chair (including a wheelchair)?: Total ?Help needed standing up from a chair using your arms (e.g., wheelchair or bedside chair)?: Total ?Help needed to walk in hospital room?: Total ?Help needed climbing 3-5 steps with a railing? : Total ?6 Click Score: 6 ? ?  ?End of Session Equipment Utilized During Treatment: Oxygen ?Activity Tolerance: Patient tolerated treatment well ?Patient left: in bed;with call bell/phone within reach;with bed alarm set;with restraints reapplied ?Nurse Communication: Mobility status;Patient requests pain meds (for headache) ?PT Visit Diagnosis: Unsteadiness on feet (R26.81);Muscle weakness (generalized) (M62.81);Other abnormalities of gait and mobility (R26.89);History of falling (Z91.81);Difficulty in walking, not elsewhere classified (R26.2) ?Pain - Right/Left: Left ?Pain - part of body: Hip ?  ? ? ?Time: [id number] ?PT Time Calculation (min) (ACUTE ONLY): 20 min ? ?Charges:  $Therapeutic Exercise: 8-22 mins          ?          ? ? ?Arby Barrette, PT ?Acute Rehabilitation Services  ?Pager [phone] ?Office [phone] ? ? ? ?Gina Bradley ?07/20/2021, 3:46 PM ? ?

## 2021-07-20 NOTE — Progress Notes (Addendum)
?      ?                 PROGRESS NOTE ? ?      ?PATIENT DETAILS ?Name: Gina Bradley ?Age: 61 y.o. ?Sex: female ?Date of Birth: 05-28-1960 ?Admit Date: 06/22/2021 ?Admitting Physician Rolla Plate, DO ?[id number], Purcell Nails, MD ? ?Brief Summary: ?Patient is a 61 y.o.  female with history of DM-2, HTN, SVT, HFpEF, steroid-dependent COPD, opiate dependence, recent compression fracture at L4/right inferior pubic rami fracture-who presented to [facility] ED with altered mental status, SOB with hypoxia, SVT-she was thought to have septic shock due to PNA-she was subsequently intubated-started on broad-spectrum antibiotics/pressors and managed in the ICU.  She was subsequently transferred to [facility] ICU-she was stabilized-however she had failure to wean and underwent a tracheostomy on 3/28.  While in the ICU-she developed fever requiring ID consultation-ICU delirium/encephalopathy requiring on and off infusion of Precedex.  After stability-she was transferred to [facility] on 4/11-however she unfortunately continued to have persistent encephalopathy, sputum cultures on 4/10 was positive for MSSA.  See below for further details. ? ?Significant events: ?3/21>> Admit to [facility], intubated, start pressors, cardiology consult for SVT, start amiodarone ?3/24>> transfer to [facility] ?3/28>> Trach for failure to wean ?4/11>> transfer to [facility] ?4/17>> trach downsized to #6 cuffless ? ?Significant studies: ?3/21>> CT head: No acute abnormalities. ?3/21>> CTA chest: No PE, multifocal pneumonia.  Multiple subacute/chronic rib fractures. ?3/21>> CT abdomen/pelvis: No acute finding-thickened endometrium. ?3/23>> RUQ ultrasound: No gallstones ?3/25>> Echo: EF 65-70% ?3/31>> MRI brain: No acute intracranial abnormality. ?4/01>> CT chest: Extensive multifocal patchy airspace consolidations. ?4/12>> EEG: No seizures. ? ?Significant microbiology data: ?3/21>> COVID/influenza PCR: Negative ?3/21>> blood culture: No growth ?3/21>> urine culture: No growth ?3/22>>  tracheal aspirate: No growth ?3/23>> urine culture: 1000 colonies/mL of Staph aureus ?3/23>> blood culture: Staph hominis (contamination) ?3/27>> tracheal aspirate: No growth ?3/31>> blood culture 1/2: Staph epidermidis (contamination) ?4/01>> tracheal aspirate: No growth ?4/03>> blood culture: No growth ?4/05>> respiratory virus panel: Meta pneumonia virus ?4/10>> tracheal aspirate: MSSA ? ?Antimicrobial therapy: ?Doxycyline 3/22 >> 3/24 ?Ceftriaxone 3/21 >>3/25 ?Azithromycin 3/21 x1 dose ?Vancomycin 3/24 >>3/25; 4/1>> 4/4, 4/10 >>4/13 ?Zosyn 3/25 >> 3/28 ?Cefepime 3/31>> 4/4 ?Diflucan 4/1>> 4/4 ?Cefazolin 4/13>> ? ?Procedures: ?3/28>> tracheostomy ? ?Consults: ?ID, PCCM, cardiology ? ?Subjective: ?Much more awake and alert compared to the past few days.  Asking if she can  drink [facility]! ? ?Objective: ?Vitals: ?Blood pressure (!) 144/76, pulse 95, temperature (!) 97 ?F (36.1 ?C), temperature source Axillary, resp. rate 18, height 5\' 5"  (1.651 m), weight 95.9 kg, last menstrual period 01/05/2015, SpO2 94 %.  ? ?Exam: ?Gen Exam:Alert awake-not in any distress ?HEENT:atraumatic, normocephalic ?Chest: B/L clear to auscultation anteriorly ?CVS:S1S2 regular ?Abdomen:soft non tender, non distended ?Extremities:+ edema ?Neurology: Non focal ?Skin: no rash  ? ?Pertinent Labs/Radiology: ? ?  Latest Ref Rng & Units 07/20/2021  ?  4:55 AM 07/19/2021  ?  7:01 AM 07/18/2021  ?  6:32 AM  ?CBC  ?WBC 4.0 - 10.5 K/uL 24.8   24.8   22.9    ?Hemoglobin 12.0 - 15.0 g/dL 9.5   9.8   9.1    ?Hematocrit 36.0 - 46.0 % 30.0   30.4   30.0    ?Platelets 150 - 400 K/uL 496   512   451    ?  ?Lab Results  ?Component Value Date  ? NA 133 (L) 07/20/2021  ? K 3.5 07/20/2021  ? CL  92 (L) 07/20/2021  ? CO2 29 07/20/2021  ? ?  ? ?Assessment/Plan: ?Septic shock: Sepsis physiology has resolved-clinically improved-however continues to have persistent leukocytosis-sputum cultures on 4/10 positive for MSSA-on Ancef.  Has also completed several rounds  of antimicrobial therapy while in the ICU.  ? ?Acute on chronic hypoxic/hypercapnic respiratory failure due to multifocal pneumonia-s/p tracheostomy: Thought to be due to aspiration pneumonia/+/- metapneumovirus pneumonia.  Had completed a course of antimicrobial therapy while in the ICU-however sputum cultures on 4/10 positive for MSSA-and on Ancef.  Per RT-secretions were initially very thick and required aggressive pulmonary toileting with Mucomyst nebs/hypertonic saline nebs/bronchodilators/vibrator vest-now much better. PCCM following and managing trach care-downgraded to #6 cuffless on 4/17. ? ?Acute metabolic encephalopathy: Likely multifactorial etiology-due to PNA/ICU delirium/sepsis/polypharmacy.  Mental status continues to improve-markedly better today-and was asking for a [facility].  Continue Seroquel/primidone-dosage of Klonopin/oxycodone/Neurontin have been titrated down.  Continue to monitor closely-neuroimaging negative for acute abnormalities-EEG negative for seizures.   ? ?Dysphagia: Due to severe encephalopathy/recent trach-cortak tube in place.  Thankfully encephalopathy has improved quite a bit-SLP following for MBS today.  If not deemed appropriate for oral intake-May need a PEG tube temporarily.  Will await further recommendations.  ? ?Transaminitis: Unclear significance-could have been from sepsis-improved-RUQ ultrasound was without acute abnormalities. ? ?COPD: Continue bronchodilators-on chronic prednisone. ? ?[id number] cardioversion on initial presentation to the ED-subsequently required amiodarone infusion-currently maintaining sinus rhythm-on beta-blocker. ? ?Acute on chronic HFpEF: Volume status stable-continue Aldactone-has required several doses of IV Lasix. ? ?Bilateral extremity lymphedema: Continues to have significant lower extremity edema-continue Aldactone -has required several doses of as needed IV Lasix. ? ?HTN: BP stable-continue metoprolol. ? ?HLD: Continue  statin ? ?DM-2 (A1c 7.5 on 3/22): No further hypoglycemic episodes-CBGs stable-continue SSI-scheduled NovoLog-reassess on 4/18. ? ?Recent Labs  ?  07/20/21 ?[id number] 07/20/21 ?[id number] 07/20/21 ?[id number]  ?GLUCAP 231* 167* 186*  ? ?  ?GERD: Continue PPI ? ?Normocytic anemia: Due to acute illness-no evidence of blood loss-transfuse if Hb <7 ? ?Endometrial thickening on CT abdomen: Will require outpatient follow-up with GYN. ? ?Opiate dependence: Have decreased oxycodone to 10 mg every 6 hours today-continue to slowly titrate down depending on mental status improvement.   ? ?Recent right pubic rami fracture: Supportive care at this point. ? ?Palliative Care: Full code in place.  Complicated patient with prolonged hospital stay-including long ICU stay-still very encephalopathic.  Has trach-NG tube in place.  Thankfully after numerous attempts at getting in touch with patient's spouse-he finally showed up in the hospital on 4/15-he has a new cell phone number that we will become active starting 4/17.  Await SLP reevaluation-if patient still is deemed not stable for oral intake-we will need goals of care discussion regarding placing PEG tube.  Appreciate palliative care input. ? ?Nutrition Status: ?Nutrition Problem: Inadequate oral intake ?Etiology: inability to eat ?Signs/Symptoms: NPO status ?Interventions: Tube feeding, Prostat ? ?Obesity ?Estimated body mass index is 35.18 kg/m? as calculated from the following: ?  Height as of this encounter: 5\' 5"  (1.651 m). ?  Weight as of this encounter: 95.9 kg.  ? ?Code status: ?  Code Status: Full Code  ? ?DVT Prophylaxis: Prophylactic Lovenox ? ?Family Communication: Spouse-Tony-[phone] called-no response-on 4/18 ? ? ?Disposition Plan: ?Status is: Inpatient ?Remains inpatient appropriate because: Septic shock-hypoxia-dysphagia on NG tube feeding-severely encephalopathic-not stable for discharge.  Per social work-[facility] transfer denied by insurance several days back. ?  ?Planned  Discharge Destination: To be determined ? ? ?Diet: ?Diet Order   ? ?       ?  Diet NPO time specified  Diet effective now       ?  ? ?  ?  ? ?  ?  ? ? ?Antimicrobial agents: ?Anti-infectives (From admission, onward

## 2021-07-21 DIAGNOSIS — I5032 Chronic diastolic (congestive) heart failure: Secondary | ICD-10-CM | POA: Diagnosis not present

## 2021-07-21 DIAGNOSIS — R9389 Abnormal findings on diagnostic imaging of other specified body structures: Secondary | ICD-10-CM | POA: Diagnosis not present

## 2021-07-21 DIAGNOSIS — J189 Pneumonia, unspecified organism: Secondary | ICD-10-CM | POA: Diagnosis not present

## 2021-07-21 DIAGNOSIS — Z7189 Other specified counseling: Secondary | ICD-10-CM

## 2021-07-21 DIAGNOSIS — R627 Adult failure to thrive: Secondary | ICD-10-CM

## 2021-07-21 DIAGNOSIS — G9341 Metabolic encephalopathy: Secondary | ICD-10-CM | POA: Diagnosis not present

## 2021-07-21 DIAGNOSIS — J9601 Acute respiratory failure with hypoxia: Secondary | ICD-10-CM | POA: Diagnosis not present

## 2021-07-21 DIAGNOSIS — A419 Sepsis, unspecified organism: Secondary | ICD-10-CM | POA: Diagnosis not present

## 2021-07-21 LAB — BASIC METABOLIC PANEL
Anion gap: 11 (ref 5–15)
BUN: 14 mg/dL (ref 6–20)
CO2: 26 mmol/L (ref 22–32)
Calcium: 8.8 mg/dL — ABNORMAL LOW (ref 8.9–10.3)
Chloride: 96 mmol/L — ABNORMAL LOW (ref 98–111)
Creatinine, Ser: 0.59 mg/dL (ref 0.44–1.00)
GFR, Estimated: >60 mL/min (ref >60)
Glucose, Bld: 214 mg/dL — ABNORMAL HIGH (ref 70–99)
Potassium: 4 mmol/L (ref 3.5–5.1)
Sodium: 133 mmol/L — ABNORMAL LOW (ref 135–145)

## 2021-07-21 LAB — GLUCOSE, CAPILLARY
Glucose-Capillary: 173 mg/dL — ABNORMAL HIGH (ref 70–99)
Glucose-Capillary: 196 mg/dL — ABNORMAL HIGH (ref 70–99)
Glucose-Capillary: 203 mg/dL — ABNORMAL HIGH (ref 70–99)
Glucose-Capillary: 214 mg/dL — ABNORMAL HIGH (ref 70–99)
Glucose-Capillary: 248 mg/dL — ABNORMAL HIGH (ref 70–99)
Glucose-Capillary: 266 mg/dL — ABNORMAL HIGH (ref 70–99)

## 2021-07-21 LAB — CBC
HCT: 30.4 % — ABNORMAL LOW (ref 36.0–46.0)
Hemoglobin: 9.6 g/dL — ABNORMAL LOW (ref 12.0–15.0)
MCH: 31 pg (ref 26.0–34.0)
MCHC: 31.6 g/dL (ref 30.0–36.0)
MCV: 98.1 fL (ref 80.0–100.0)
Platelets: 508 10*3/uL — ABNORMAL HIGH (ref 150–400)
RBC: 3.1 MIL/uL — ABNORMAL LOW (ref 3.87–5.11)
RDW: 19.7 % — ABNORMAL HIGH (ref 11.5–15.5)
WBC: 21.4 10*3/uL — ABNORMAL HIGH (ref 4.0–10.5)
nRBC: 0.7 % — ABNORMAL HIGH (ref 0.0–0.2)

## 2021-07-21 MED ORDER — INSULIN ASPART 100 UNIT/ML IJ SOLN
5.0000 [IU] | INTRAMUSCULAR | Status: DC
  Administered 2021-07-21 – 2021-07-22 (×7): 5 [IU] via SUBCUTANEOUS
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
## 2021-07-21 NOTE — Evaluation (Signed)
Occupational Therapy Evaluation ?Patient Details ?Name: Gina Bradley ?MRN: [id number] ?DOB: 21-Apr-1960 ?Today's Date: 07/21/2021 ? ? ?History of Present Illness 61 yo female former smoker presented to [facility] ER on 3/21 with AMS, fever.  Found to have multifocal pneumonia and required intubation and pressors.  Transferred to [facility] 06/25/21 for treatment of acute on chronic hypoxic/hypercapnic respiratory failure from PNA, COPD with acute COPD, paroxysmal SVT, acute on chronic diastolic CHF, and acute metabolic encephalopathy in presence of peripheral neuropathy, back pain and chronic opiate use. PMH: CAD, smoker, heart failure, GERD, type 2 DM, hypertension, SVT, peripheral neuropathy, steroid dependent COPD, chronic pain history of DVT, chronic lymphedema, spinal stenosis, was recently diagnosed with compression deformity of the L4 vertebrae and subacute fracture of the right inferior pubic rami seen in the ED on 06/06/2021 after fall.  ? ?Clinical Impression ?  ?Pt supine in bed, awake and agreeable to OT session.  Pt asking OT to shut door and pull curtain for privacy.  Much more alert and speaking over trach today. Patient requires total assist to reposition in bed, positioned into chair position.  Once upright, engaged in grooming tasks with min guard for safety and UE exercises- she fatigues easily and requesting to rest after completion of B shoulder flexion and hand flex/extension.  Will follow acutely.   ?   ? ?Recommendations for follow up therapy are one component of a multi-disciplinary discharge planning process, led by the attending physician.  Recommendations may be updated based on patient status, additional functional criteria and insurance authorization.  ? ?Follow Up Recommendations ? OT at Long-term acute care hospital  ?  ?Assistance Recommended at Discharge Frequent or constant Supervision/Assistance  ?Patient can return home with the following Two people to help with walking and/or transfers;Two people  to help with bathing/dressing/bathroom;Assistance with feeding;Direct supervision/assist for medications management;Direct supervision/assist for financial management;Assist for transportation;Help with stairs or ramp for entrance;Assistance with cooking/housework ? ?  ?Functional Status Assessment ?    ?Equipment Recommendations ? Other (comment) (defer)  ?  ?Recommendations for Other Services   ? ? ?  ?Precautions / Restrictions Precautions ?Precautions: Fall ?Precaution Comments: hx of fall with L4 compression fx ?Restrictions ?Weight Bearing Restrictions: No  ? ?  ? ?Mobility Bed Mobility ?Overal bed mobility: Needs Assistance ?Bed Mobility: Supine to Sit ?  ?  ?  ?  ?  ?General bed mobility comments: using chair position to in bed, requires total assist to scoot up in bed ?  ? ?Transfers ?  ?  ?  ?  ?  ?  ?  ?  ?  ?  ?  ? ?  ?Balance   ?  ?  ?  ?  ?  ?  ?  ?  ?  ?  ?  ?  ?  ?  ?  ?  ?  ?  ?   ? ?ADL either performed or assessed with clinical judgement  ? ?ADL Overall ADL's : Needs assistance/impaired ?  ?  ?Grooming: Wash/dry face;Min guard;Sitting ?Grooming Details (indicate cue type and reason): chair position in bed, min guard to safety using R hand ?  ?  ?  ?  ?  ?  ?  ?  ?  ?  ?  ?  ?  ?  ?  ?   ? ? ? ?Vision   ?   ?   ?Perception   ?  ?Praxis   ?  ? ?Pertinent Vitals/Pain Pain Assessment ?Pain  Assessment: Faces ?Faces Pain Scale: No hurt  ? ? ? ?Hand Dominance   ?  ?Extremity/Trunk Assessment   ?  ?  ?  ?  ?  ?Communication   ?  ?Cognition Arousal/Alertness: Awake/alert ?Behavior During Therapy: Restless ?Overall Cognitive Status: Impaired/Different from baseline ?Area of Impairment: Orientation, Attention, Following commands, Memory, Safety/judgement, Awareness, Problem solving ?  ?  ?  ?  ?  ?  ?  ?  ?Orientation Level: Disoriented to, Situation ?Current Attention Level: Sustained ?Memory: Decreased recall of precautions, Decreased short-term memory ?Following Commands: Follows one step commands with  increased time, Follows one step commands consistently ?Safety/Judgement: Decreased awareness of safety, Decreased awareness of deficits ?Awareness: Intellectual ?Problem Solving: Slow processing, Decreased initiation, Difficulty sequencing, Requires verbal cues, Requires tactile cues ?General Comments: pt following simple 1 step commands, improving awareness to deficits and need for assistance.  Reports frustrated that she is in wrist restraints, but poor problem solving during session.  Requires constant cueing to attend to and complete sustained task. ?  ?  ?General Comments  trach collar 5L 28% ? ?  ?Exercises Exercises: General Upper Extremity ?General Exercises - Upper Extremity ?Shoulder Flexion: AROM, AAROM, Both, 10 reps, Seated ?Shoulder Extension: AAROM, AROM, 10 reps, Both, Seated ?Digit Composite Flexion: AROM, Both, 10 reps, Seated ?Composite Extension: AROM, Both, 10 reps, Seated ?  ?Shoulder Instructions    ? ? ?Home Living   ?  ?  ?  ?  ?  ?  ?  ?  ?  ?  ?  ?  ?  ?  ?  ?  ?  ?  ? ?  ?Prior Functioning/Environment   ?  ?  ?  ?  ?  ?  ?  ?  ?  ? ?  ?  ?OT Problem List:   ?  ?   ?OT Treatment/Interventions:    ?  ?OT Goals(Current goals can be found in the care plan section) Acute Rehab OT Goals ?Patient Stated Goal: none stated ?Time For Goal Achievement: 07/27/21 ?Potential to Achieve Goals: Good  ?OT Frequency: Min 2X/week ?  ? ?Co-evaluation   ?  ?  ?  ?  ? ?  ?AM-PAC OT "6 Clicks" Daily Activity     ?Outcome Measure Help from another person eating meals?: Total ?Help from another person taking care of personal grooming?: A Little ?Help from another person toileting, which includes using toliet, bedpan, or urinal?: Total ?Help from another person bathing (including washing, rinsing, drying)?: A Lot ?Help from another person to put on and taking off regular upper body clothing?: A Lot ?Help from another person to put on and taking off regular lower body clothing?: Total ?6 Click Score: 10 ?  ?End of  Session Equipment Utilized During Treatment: Oxygen ?Nurse Communication: Mobility status ? ?Activity Tolerance: Patient tolerated treatment well ?Patient left: in bed;with call bell/phone within reach;with bed alarm set;with nursing/sitter in room;with restraints reapplied ? ?OT Visit Diagnosis: Other abnormalities of gait and mobility (R26.89);Muscle weakness (generalized) (M62.81);Other symptoms and signs involving cognitive function  ?              ?Time: [phone] ?OT Time Calculation (min): 21 min ?Charges:  OT General Charges ?$OT Visit: 1 Visit ?OT Treatments ?$Self Care/Home Management : 8-22 mins ? ?Jolaine Artist, OT ?Acute Rehabilitation Services ?Pager [phone] ?Office [phone] ? ? ?Delight Stare ?07/21/2021, 11:38 AM ?

## 2021-07-21 NOTE — Progress Notes (Signed)
?      ?                 PROGRESS NOTE ? ?      ?PATIENT DETAILS ?Name: Gina Bradley ?Age: 61 y.o. ?Sex: female ?Date of Birth: 1961-02-13 ?Admit Date: 06/22/2021 ?Admitting Physician Rolla Plate, DO ?[id number], Purcell Nails, MD ? ?Brief Summary: ?Patient is a 61 y.o.  female with history of DM-2, HTN, SVT, HFpEF, steroid-dependent COPD, opiate dependence, recent compression fracture at L4/right inferior pubic rami fracture-who presented to [facility] ED with altered mental status, SOB with hypoxia, SVT-she was thought to have septic shock due to PNA-she was subsequently intubated-started on broad-spectrum antibiotics/pressors and managed in the ICU.  She was subsequently transferred to [facility] ICU-she was stabilized-however she had failure to wean and underwent a tracheostomy on 3/28.  While in the ICU-she developed fever requiring ID consultation-ICU delirium/encephalopathy requiring on and off infusion of Precedex.  After stability-she was transferred to [facility] on 4/11-however she unfortunately continued to have persistent encephalopathy, sputum cultures on 4/10 was positive for MSSA.  See below for further details. ? ?Significant events: ?3/21>> Admit to [facility], intubated, start pressors, cardiology consult for SVT, start amiodarone ?3/24>> transfer to [facility] ?3/28>> Trach for failure to wean ?4/11>> transfer to [facility] ?4/17>> trach downsized to #6 cuffless ?4/18>> SLP reeval and MBS completed-dysphagia 1 diet started. ? ?Significant studies: ?3/21>> CT head: No acute abnormalities. ?3/21>> CTA chest: No PE, multifocal pneumonia.  Multiple subacute/chronic rib fractures. ?3/21>> CT abdomen/pelvis: No acute finding-thickened endometrium. ?3/23>> RUQ ultrasound: No gallstones ?3/25>> Echo: EF 65-70% ?3/31>> MRI brain: No acute intracranial abnormality. ?4/01>> CT chest: Extensive multifocal patchy airspace consolidations. ?4/12>> EEG: No seizures. ? ?Significant microbiology data: ?3/21>> COVID/influenza PCR: Negative ?3/21>>  blood culture: No growth ?3/21>> urine culture: No growth ?3/22>> tracheal aspirate: No growth ?3/23>> urine culture: 1000 colonies/mL of Staph aureus ?3/23>> blood culture: Staph hominis (contamination) ?3/27>> tracheal aspirate: No growth ?3/31>> blood culture 1/2: Staph epidermidis (contamination) ?4/01>> tracheal aspirate: No growth ?4/03>> blood culture: No growth ?4/05>> respiratory virus panel: Meta pneumonia virus ?4/10>> tracheal aspirate: MSSA ? ?Antimicrobial therapy: ?Doxycyline 3/22 >> 3/24 ?Ceftriaxone 3/21 >>3/25 ?Azithromycin 3/21 x1 dose ?Vancomycin 3/24 >>3/25; 4/1>> 4/4, 4/10 >>4/13 ?Zosyn 3/25 >> 3/28 ?Cefepime 3/31>> 4/4 ?Diflucan 4/1>> 4/4 ?Cefazolin 4/13>> ? ?Procedures: ?3/28>> tracheostomy ? ?Consults: ?ID, PCCM, cardiology ? ?Subjective: ?Awake-following commands-slowly improving.  Started on dysphagia 1 diet yesterday-minimal oral intake so far-as she is still recovering from a encephalopathy. ? ?Objective: ?Vitals: ?Blood pressure 98/60, pulse 98, temperature 98.1 ?F (36.7 ?C), temperature source Axillary, resp. rate 20, height 5\' 5"  (1.651 m), weight 95.3 kg, last menstrual period 01/05/2015, SpO2 96 %.  ? ?Exam: ?Gen Exam:not in any distress ?HEENT:atraumatic, normocephalic ?Chest: B/L clear to auscultation anteriorly ?CVS:S1S2 regular ?Abdomen:soft non tender, non distended ?Extremities:no edema ?Neurology: Non focal ?Skin: no rash  ? ?Pertinent Labs/Radiology: ? ?  Latest Ref Rng & Units 07/21/2021  ?  4:21 AM 07/20/2021  ?  4:55 AM 07/19/2021  ?  7:01 AM  ?CBC  ?WBC 4.0 - 10.5 K/uL 21.4   24.8   24.8    ?Hemoglobin 12.0 - 15.0 g/dL 9.6   9.5   9.8    ?Hematocrit 36.0 - 46.0 % 30.4   30.0   30.4    ?Platelets 150 - 400 K/uL 508   496   512    ?  ?Lab Results  ?Component Value Date  ? NA 133 (L) 07/21/2021  ?  K 4.0 07/21/2021  ? CL 96 (L) 07/21/2021  ? CO2 26 07/21/2021  ? ?  ? ?Assessment/Plan: ?Septic shock: Sepsis physiology has resolved-clinically improved-however continues to have  persistent leukocytosis-sputum cultures on 4/10 positive for MSSA-on Ancef.  Has also completed several rounds of antimicrobial therapy while in the ICU.  ? ?Acute on chronic hypoxic/hypercapnic respiratory failure due to multifocal pneumonia-s/p tracheostomy: Thought to be due to aspiration pneumonia/+/- metapneumovirus pneumonia.  Had completed a course of antimicrobial therapy while in the ICU-however sputum cultures on 4/10 positive for MSSA-and on Ancef.  Per RT-secretions were initially very thick and required aggressive pulmonary toileting with Mucomyst nebs/hypertonic saline nebs/bronchodilators/vibrator vest-now much better. PCCM following and managing trach care-downgraded to #6 cuffless on 4/17. ? ?Acute metabolic encephalopathy: Likely multifactorial etiology-due to PNA/ICU delirium/sepsis/polypharmacy.  Mental status continues to improve-but still has.'s of waxing/waning delirium.  Continue Seroquel/primidone-dosage of Klonopin/oxycodone/Neurontin have been titrated down-we will continue to optimize slowly over the next few days.  Neuroimaging negative for acute abnormalities-EEG negative for seizures.   ? ?Dysphagia: Due to severe encephalopathy/recent trach-cortak tube in place.  Thankfully encephalopathy has improved quite a bit-reevaluated by SLP on 4/18-started on a dysphagia 1 diet.  Still has waxing/waning encephalopathy that precludes consistent oral intake-continue supportive care-discussed with nursing staff to see if we can time oral intake and hold off Klonopin/narcotic dosing until she has eaten.  Suspect that with time-patient will continue to slowly improve.   We will hold off having PEG tube discussions with the family for the next few days. ? ?Transaminitis: Unclear significance-could have been from sepsis-improved-RUQ ultrasound was without acute abnormalities. ? ?COPD: Continue bronchodilators-on chronic prednisone. ? ?[id number] cardioversion on initial presentation to the  ED-subsequently required amiodarone infusion-currently maintaining sinus rhythm-on beta-blocker. ? ?Acute on chronic HFpEF: Volume status stable-continue Aldactone-has required several doses of IV Lasix. ? ?Bilateral extremity lymphedema: Continues to have significant lower extremity edema-continue Aldactone -has required several doses of as needed IV Lasix. ? ?HTN: BP stable-continue metoprolol. ? ?HLD: Continue statin ? ?DM-2 (A1c 7.5 on 3/22): CBGs relatively stable-continue SSI-increased scheduled NovoLog to 5 units every 4 hours-follow and reassess over the next few days.   ? ?Recent Labs  ?  07/21/21 ?0405 07/21/21 ?0746 07/21/21 ?1156  ?GLUCAP 203* 173* 214*  ? ?  ?GERD: Continue PPI ? ?Normocytic anemia: Due to acute illness-no evidence of blood loss-transfuse if Hb <7 ? ?Endometrial thickening on CT abdomen: Will require outpatient follow-up with GYN. ? ?Opiate dependence: Have decreased oxycodone to 10 mg every 6 hours today-continue to slowly titrate down depending on mental status improvement.   ? ?Recent right pubic rami fracture: Supportive care at this point. ? ?Palliative Care: Full code in place.  Complicated patient with prolonged hospital stay-including long ICU stay-still very encephalopathic.  Has trach-NG tube in place.  Thankfully after numerous attempts at getting in touch with patient's spouse-he finally showed up in the hospital on 4/15-he has a new cell phone number that we will become active starting 4/17 (unfortunately he is still not answering).  Reevaluated by SLP on 4/18-although tenuous-thought to be able to tolerate a dysphagia 1 diet-continues to have waxing/waning delirium that is a barrier for consistent oral intake.  Continue supportive care-we will give it a few days before considering PEG tube placement. ? ?Nutrition Status: ?Nutrition Problem: Inadequate oral intake ?Etiology: inability to eat ?Signs/Symptoms: NPO status ?Interventions: Tube feeding,  Prostat ? ?Obesity ?Estimated body mass index is 34.96 kg/m? as calculated from the following: ?  Height as of this encounter: 5\' 5"  (1.651 m). ?  Weight as of this encounter: 95.3 kg.  ? ?Code status: ?  Code Status: Full Code  ? ?DVT

## 2021-07-21 NOTE — Progress Notes (Signed)
Inpatient Diabetes Program Recommendations ? ?AACE/ADA: New Consensus Statement on Inpatient Glycemic Control (2015) ? ?Target Ranges:  Prepandial:   less than 140 mg/dL ?     Peak postprandial:   less than 180 mg/dL (1-2 hours) ?     Critically ill patients:  140 - 180 mg/dL  ? ?Lab Results  ?Component Value Date  ? GLUCAP 173 (H) 07/21/2021  ? HGBA1C 7.5 (H) 06/23/2021  ? ? ?Review of Glycemic Control ? Latest Reference Range & Units 07/20/21 16:12 07/20/21 20:14 07/21/21 00:16 07/21/21 04:05 07/21/21 07:46  ?Glucose-Capillary 70 - 99 mg/dL 325 (H) 498 (H) 264 (H) 203 (H) 173 (H)  ?(H): Data is abnormally high ?Diabetes history: Type 2 DM ?Outpatient Diabetes medications: Amaryl 4 mg QD ?Current orders for Inpatient glycemic control: Novolog 3 units q4h, Novolog 0-9 units Q4H ?Prednisone 5 mg QD ?Jevity @ 55 ml/hr ? ?Inpatient Diabetes Program Recommendations:   ? ?In the setting of steroids, consider increasing Novolog to 5 units Q4H for tube feed coverage (to be stopped or held in the event tube feeds discontinued).  ? ?Thanks, ?Lujean Rave, MSN, RNC-OB ?Diabetes Coordinator ?[phone] (8a-5p) ? ? ?

## 2021-07-21 NOTE — Progress Notes (Signed)
Patient's husband Rosland Riding) visited patient for about 30-40 minutes. Briefly spoke with patient's husband and was unable to obtain an alternate phone number. Attending physician notified of brief visit by the patient's husband.  ?

## 2021-07-21 NOTE — Plan of Care (Signed)

## 2021-07-21 NOTE — Progress Notes (Signed)
Changed patient H2O trach bottle. ?

## 2021-07-21 NOTE — Progress Notes (Signed)
Speech Language Pathology Treatment: Dysphagia  ?Patient Details ?Name: Gina Bradley ?MRN: [id number] ?DOB: 09-23-1960 ?Today's Date: 07/21/2021 ?Time: 1600-1620 ?SLP Time Calculation (min) (ACUTE ONLY): 20 min ? ?Assessment / Plan / Recommendation ?Clinical Impression ? Patient seen by SLP for skilled treatment session focused on dysphagia goals. Patient was sitting up in bed and was awake and alert, RN entered room shortly after SLP did. Patient was observed while eating some of her lunch tray items. She was able to feed herself with spoon and hold cup however her primary difficulty appears to be cognition as she is extremely distracted, verbose, tangential and confused. She requires frequent cues to initiate PO intake. No overt s/s aspiration or penetration during PO intake. Voice was clear and strong with PMV donned and vitals were [facility]. SLP is recommending to continue on current diet of Dys 1 solids, thin liquids and plan for upgraded solids trials in upcoming SLP therapy visits. ? ?  ?HPI HPI: 61 yo female former smoker presented to [facility] ER on 3/21 with AMS, fever.  Found to have multifocal pneumonia and required intubation and pressors.  Transferred to [facility] for further management. ETT 3/21; trach 3/28 secondary to failure to wean; TC trials 4/8. Dx acute metabolic encephalopathy, pmhx peripheral neuropathy, HF, HTN, DM2. ?  ?   ?SLP Plan ? Continue with current plan of care ? ?  ?  ?Recommendations for follow up therapy are one component of a multi-disciplinary discharge planning process, led by the attending physician.  Recommendations may be updated based on patient status, additional functional criteria and insurance authorization. ?  ? ?Recommendations  ?Diet recommendations: Dysphagia 1 (puree);Thin liquid ?Liquids provided via: Cup;Straw ?Medication Administration: Crushed with puree ?Supervision: Patient able to self feed;Full supervision/cueing for compensatory strategies ?Compensations: Slow rate;Small  sips/bites;Minimize environmental distractions  ?   ? Patient may use Passy-Muir Speech Valve: Intermittently with supervision;During PO intake/meals ?PMSV Supervision: Full  ?   ? ? ? ? Oral Care Recommendations: Oral care QID;Staff/trained caregiver to provide oral care ?Follow Up Recommendations: SLP at Long-term acute care hospital ?Assistance recommended at discharge: Frequent or constant Supervision/Assistance ?SLP Visit Diagnosis: Dysphagia, oropharyngeal phase (R13.12) ?Plan: Continue with current plan of care ? ? ? ? ?  ?  ? ?Charnice Nevin, MA, CCC-SLP ?Speech Therapy ? ?

## 2021-07-22 DIAGNOSIS — I5032 Chronic diastolic (congestive) heart failure: Secondary | ICD-10-CM | POA: Diagnosis not present

## 2021-07-22 DIAGNOSIS — J9601 Acute respiratory failure with hypoxia: Secondary | ICD-10-CM | POA: Diagnosis not present

## 2021-07-22 DIAGNOSIS — G9341 Metabolic encephalopathy: Secondary | ICD-10-CM | POA: Diagnosis not present

## 2021-07-22 DIAGNOSIS — R9389 Abnormal findings on diagnostic imaging of other specified body structures: Secondary | ICD-10-CM | POA: Diagnosis not present

## 2021-07-22 LAB — GLUCOSE, CAPILLARY
Glucose-Capillary: 199 mg/dL — ABNORMAL HIGH (ref 70–99)
Glucose-Capillary: 121 mg/dL — ABNORMAL HIGH (ref 70–99)
Glucose-Capillary: 154 mg/dL — ABNORMAL HIGH (ref 70–99)
Glucose-Capillary: 186 mg/dL — ABNORMAL HIGH (ref 70–99)
Glucose-Capillary: 246 mg/dL — ABNORMAL HIGH (ref 70–99)
Glucose-Capillary: 110 mg/dL — ABNORMAL HIGH (ref 70–99)

## 2021-07-22 LAB — BASIC METABOLIC PANEL
Anion gap: 12 (ref 5–15)
BUN: 13 mg/dL (ref 6–20)
CO2: 26 mmol/L (ref 22–32)
Calcium: 9.2 mg/dL (ref 8.9–10.3)
Chloride: 96 mmol/L — ABNORMAL LOW (ref 98–111)
Creatinine, Ser: 0.65 mg/dL (ref 0.44–1.00)
GFR, Estimated: >60 mL/min (ref >60)
Glucose, Bld: 204 mg/dL — ABNORMAL HIGH (ref 70–99)
Potassium: 4.6 mmol/L (ref 3.5–5.1)
Sodium: 134 mmol/L — ABNORMAL LOW (ref 135–145)

## 2021-07-22 LAB — CBC
HCT: 31.8 % — ABNORMAL LOW (ref 36.0–46.0)
Hemoglobin: 9.9 g/dL — ABNORMAL LOW (ref 12.0–15.0)
MCH: 31.2 pg (ref 26.0–34.0)
MCHC: 31.1 g/dL (ref 30.0–36.0)
MCV: 100.3 fL — ABNORMAL HIGH (ref 80.0–100.0)
Platelets: 466 10*3/uL — ABNORMAL HIGH (ref 150–400)
RBC: 3.17 MIL/uL — ABNORMAL LOW (ref 3.87–5.11)
RDW: 19.7 % — ABNORMAL HIGH (ref 11.5–15.5)
WBC: 23.7 10*3/uL — ABNORMAL HIGH (ref 4.0–10.5)
nRBC: 0.7 % — ABNORMAL HIGH (ref 0.0–0.2)

## 2021-07-22 MED ORDER — HALOPERIDOL LACTATE 5 MG/ML IJ SOLN
3.0000 mg | Freq: Once | INTRAMUSCULAR | Status: AC
  Administered 2021-07-22: 3 mg via INTRAVENOUS
  Filled 2021-07-22: qty 1

## 2021-07-22 MED ORDER — INSULIN ASPART 100 UNIT/ML IJ SOLN
0.0000 [IU] | Freq: Three times a day (TID) | INTRAMUSCULAR | Status: DC
  Administered 2021-07-23: 2 [IU] via SUBCUTANEOUS
  Administered 2021-07-23: 5 [IU] via SUBCUTANEOUS
  Administered 2021-07-23: 1 [IU] via SUBCUTANEOUS
  Administered 2021-07-24: 3 [IU] via SUBCUTANEOUS
  Administered 2021-07-24: 7 [IU] via SUBCUTANEOUS
  Administered 2021-07-24: 1 [IU] via SUBCUTANEOUS
  Administered 2021-07-25: 2 [IU] via SUBCUTANEOUS
  Administered 2021-07-25 – 2021-07-26 (×3): 1 [IU] via SUBCUTANEOUS
  Administered 2021-07-26: 3 [IU] via SUBCUTANEOUS
  Administered 2021-07-26 – 2021-07-27 (×2): 2 [IU] via SUBCUTANEOUS
  Administered 2021-07-27: 1 [IU] via SUBCUTANEOUS
  Administered 2021-07-27 – 2021-07-28 (×2): 2 [IU] via SUBCUTANEOUS
  Administered 2021-07-28: 1 [IU] via SUBCUTANEOUS
  Administered 2021-07-28: 2 [IU] via SUBCUTANEOUS
  Administered 2021-07-29: 3 [IU] via SUBCUTANEOUS
  Administered 2021-07-29: 1 [IU] via SUBCUTANEOUS
  Administered 2021-07-29: 7 [IU] via SUBCUTANEOUS
  Administered 2021-07-30: 2 [IU] via SUBCUTANEOUS
  Administered 2021-07-30: 3 [IU] via SUBCUTANEOUS
  Administered 2021-07-31 (×2): 2 [IU] via SUBCUTANEOUS
  Administered 2021-07-31: 5 [IU] via SUBCUTANEOUS
  Administered 2021-08-01 – 2021-08-02 (×5): 2 [IU] via SUBCUTANEOUS
  Administered 2021-08-02: 1 [IU] via SUBCUTANEOUS
  Administered 2021-08-03: 2 [IU] via SUBCUTANEOUS
  Administered 2021-08-03 – 2021-08-04 (×2): 3 [IU] via SUBCUTANEOUS

## 2021-07-22 NOTE — Progress Notes (Signed)
?      ?                 PROGRESS NOTE ? ?      ?PATIENT DETAILS ?Name: KORALIE CONGROVE ?Age: 61 y.o. ?Sex: female ?Date of Birth: 12-May-1960 ?Admit Date: 06/22/2021 ?Admitting Physician Rolla Plate, DO ?[id number], Purcell Nails, MD ? ?Brief Summary: ?Patient is a 61 y.o.  female with history of DM-2, HTN, SVT, HFpEF, steroid-dependent COPD, opiate dependence, recent compression fracture at L4/right inferior pubic rami fracture-who presented to [facility] ED with altered mental status, SOB with hypoxia, SVT-she was thought to have septic shock due to PNA-she was subsequently intubated-started on broad-spectrum antibiotics/pressors and managed in the ICU.  She was subsequently transferred to [facility] ICU-she was stabilized-however she had failure to wean and underwent a tracheostomy on 3/28.  While in the ICU-she developed fever requiring ID consultation-ICU delirium/encephalopathy requiring on and off infusion of Precedex.  After stability-she was transferred to [facility] on 4/11-however she unfortunately continued to have persistent encephalopathy, sputum cultures on 4/10 was positive for MSSA.  See below for further details. ? ?Significant events: ?3/21>> Admit to [facility], intubated, start pressors, cardiology consult for SVT, start amiodarone ?3/24>> transfer to [facility] ?3/28>> Trach for failure to wean ?4/11>> transfer to [facility] ?4/17>> trach downsized to #6 cuffless ?4/18>> SLP reeval and MBS completed-dysphagia 1 diet started. ?4/19>> eating around 40% of some of her meals-at times able to feed herself. ?4/20>> Cortak tube removed ? ?Significant studies: ?3/21>> CT head: No acute abnormalities. ?3/21>> CTA chest: No PE, multifocal pneumonia.  Multiple subacute/chronic rib fractures. ?3/21>> CT abdomen/pelvis: No acute finding-thickened endometrium. ?3/23>> RUQ ultrasound: No gallstones ?3/25>> Echo: EF 65-70% ?3/31>> MRI brain: No acute intracranial abnormality. ?4/01>> CT chest: Extensive multifocal patchy airspace  consolidations. ?4/12>> EEG: No seizures. ? ?Significant microbiology data: ?3/21>> COVID/influenza PCR: Negative ?3/21>> blood culture: No growth ?3/21>> urine culture: No growth ?3/22>> tracheal aspirate: No growth ?3/23>> urine culture: 1000 colonies/mL of Staph aureus ?3/23>> blood culture: Staph hominis (contamination) ?3/27>> tracheal aspirate: No growth ?3/31>> blood culture 1/2: Staph epidermidis (contamination) ?4/01>> tracheal aspirate: No growth ?4/03>> blood culture: No growth ?4/05>> respiratory virus panel: Meta pneumonia virus ?4/10>> tracheal aspirate: MSSA ? ?Antimicrobial therapy: ?Doxycyline 3/22 >> 3/24 ?Ceftriaxone 3/21 >>3/25 ?Azithromycin 3/21 x1 dose ?Vancomycin 3/24 >>3/25; 4/1>> 4/4, 4/10 >>4/13 ?Zosyn 3/25 >> 3/28 ?Cefepime 3/31>> 4/4 ?Diflucan 4/1>> 4/4 ?Cefazolin 4/13>> 4/19. ? ?Procedures: ?3/28>> tracheostomy ? ?Consults: ?ID, PCCM, cardiology ? ?Subjective: ?Had some delirium last night-received Haldol-still somewhat drowsy but awake. ? ?Objective: ?Vitals: ?Blood pressure (!) 110/57, pulse 84, temperature 98.3 ?F (36.8 ?C), temperature source Oral, resp. rate 18, height 5\' 5"  (1.651 m), weight 96.6 kg, last menstrual period 01/05/2015, SpO2 95 %.  ? ?Exam: ?Gen Exam:Alert awake-not in any distress ?HEENT:atraumatic, normocephalic ?Chest: B/L clear to auscultation anteriorly ?CVS:S1S2 regular ?Abdomen:soft non tender, non distended ?Extremities:no edema ?Neurology: Non focal ?Skin: no rash  ? ?Pertinent Labs/Radiology: ? ?  Latest Ref Rng & Units 07/22/2021  ? 12:52 AM 07/21/2021  ?  4:21 AM 07/20/2021  ?  4:55 AM  ?CBC  ?WBC 4.0 - 10.5 K/uL 23.7   21.4   24.8    ?Hemoglobin 12.0 - 15.0 g/dL 9.9   9.6   9.5    ?Hematocrit 36.0 - 46.0 % 31.8   30.4   30.0    ?Platelets 150 - 400 K/uL 466   508   496    ?  ?Lab Results  ?Component  Value Date  ? NA 134 (L) 07/22/2021  ? K 4.6 07/22/2021  ? CL 96 (L) 07/22/2021  ? CO2 26 07/22/2021  ? ?  ? ?Assessment/Plan: ?Septic shock: Sepsis physiology  has resolved-clinically improved-however continues to have persistent leukocytosis-sputum cultures on 4/10 positive for MSSA-completed a course of Ancef on 4/19.  Previously while in the ICU was on numerous antibiotics as well (see above) ? ?Acute on chronic hypoxic/hypercapnic respiratory failure due to multifocal pneumonia-s/p tracheostomy: Thought to be due to aspiration pneumonia/+/- metapneumovirus pneumonia.  Overall much better-PCCM following and managing trach-downgraded to #6 cuffless on 4/17.   ? ?Acute metabolic encephalopathy: Likely multifactorial etiology-due to PNA/ICU delirium/sepsis/polypharmacy.  Slowly improving-but continues to have waxing and waning delirium.  Continue Seroquel/prednisone/Klonopin/oxycodone/Neurontin-dosage of Klonopin and oxycodone-has been slowly titrated down over the past few days-we will continue to follow and optimize.  Neuroimaging negative for significant abnormalities-EEG negative for seizures. ? ?Dysphagia: Due to severe encephalopathy/recent trach-cortak tube in place.  Thankfully encephalopathy has improved quite a bit-reevaluated by SLP on 4/18-started on a dysphagia 1 diet.  On 4/19-she had significant improvement in was able to consume around 40% of some of her meals.  Plan is to remove NG tube today and see how she does.  Hopefully this will minimize use of restraints and help with resolution of encephalopathy.   ? ?Transaminitis: Unclear significance-could have been from sepsis-improved-RUQ ultrasound was without acute abnormalities. ? ?COPD: Continue bronchodilators-on chronic prednisone. ? ?[id number] cardioversion on initial presentation to the ED-subsequently required amiodarone infusion-currently maintaining sinus rhythm-on beta-blocker. ? ?Acute on chronic HFpEF: Volume status stable-continue Aldactone-has required several doses of IV Lasix. ? ?Bilateral extremity lymphedema: Continues to have significant lower extremity edema-continue Aldactone -has  required several doses of as needed IV Lasix. ? ?HTN: BP stable-continue metoprolol. ? ?HLD: Continue statin ? ?DM-2 (A1c 7.5 on 3/22): CBGs relatively stable-continue SSI-increased scheduled NovoLog to 5 units every 4 hours-follow and reassess over the next few days.   ? ?Recent Labs  ?  07/22/21 ?0353 07/22/21 ?[id number] 07/22/21 ?1153  ?GLUCAP 121* 154* 186*  ? ?  ?GERD: Continue PPI ? ?Normocytic anemia: Due to acute illness-no evidence of blood loss-transfuse if Hb <7 ? ?Endometrial thickening on CT abdomen: Will require outpatient follow-up with GYN. ? ?Opiate dependence: Have decreased oxycodone to 10 mg every 6 hours today-continue to slowly titrate down depending on mental status improvement.   ? ?Recent right pubic rami fracture: Supportive care at this point. ? ?Palliative Care: Full code in place.  Complicated patient with prolonged hospital stay-including long ICU stay-still very encephalopathic.  Has trach-NG tube in place.  Thankfully after numerous attempts at getting in touch with patient's spouse-he finally showed up in the hospital on 4/15-he has a new cell phone number that we will become active starting 4/17 (unfortunately he is still not answering).  Reevaluated by SLP on 4/18-started on dysphagia 1 diet that she has gradually tolerated over the past 48 hours-plan to remove NG tube today and see how she does. ? ?Note-spouse was apparently at bedside yesterday-when this MD attempted to call-he had already left the floor. ? ?Nutrition Status: ?Nutrition Problem: Inadequate oral intake ?Etiology: inability to eat ?Signs/Symptoms: NPO status ?Interventions: Tube feeding, Prostat ? ?Obesity ?Estimated body mass index is 35.44 kg/m? as calculated from the following: ?  Height as of this encounter: 5\' 5"  (1.651 m). ?  Weight as of this encounter: 96.6 kg.  ? ?Code status: ?  Code Status: Full Code  ? ?DVT  Prophylaxis: Prophylactic Lovenox ? ?Family Communication: Spouse-[phone] called-no  response-on 4/20 ? ? ?Disposition Plan: ?Status is: Inpatient ?Remains inpatient appropriate because: Septic shock-hypoxia-dysphagia on NG tube feeding-severely encephalopathic-not stable for discharge.  Per social work-[facility] tra

## 2021-07-22 NOTE — Plan of Care (Signed)

## 2021-07-22 NOTE — Progress Notes (Signed)
Speech Language Pathology Treatment: Dysphagia;Passy Muir Speaking valve  ?Patient Details ?Name: Gina Bradley ?MRN: [id number] ?DOB: [birth year] ?Today's Date: 07/22/2021 ?Time: 0354-6568 ?SLP Time Calculation (min) (ACUTE ONLY): 12 min ? ?Assessment / Plan / Recommendation ?Clinical Impression ? Session limited due to pt's lethargy. Per chart review she was given Oxycodone around 0600 and her Seroquel around 0930 likely contributors. Therapist donned PMV, provided max stimulation with tactile and verbal means and pt intermittently opening her eyes and responded to questions x 3. Vocal quality normal and HR 85, SpO2 95% and RR 21. No signs of discomfort or respiratory issues wearing valve and able to redirect air to upper airway efficiently.  ?PO consumption was brief given her lethargy and she tolerated several sips straw sips water and bites puree without s/s aspiration. Pt not appropriate to attempt higher textures at this time. Recommend pt wear valve with therapies when alert with full supervision. Continue efforts and will attempt higher textures when pt more alert.  ?  ?HPI HPI: 61 yo female former smoker presented to [facility] ER on 3/21 with AMS, fever.  Found to have multifocal pneumonia and required intubation and pressors.  Transferred to [facility] for further management. ETT 3/21; trach 3/28 secondary to failure to wean; TC trials 4/8. Dx acute metabolic encephalopathy, pmhx peripheral neuropathy, HF, HTN, DM2. ?  ?   ?SLP Plan ? Continue with current plan of care ? ?  ?  ?Recommendations for follow up therapy are one component of a multi-disciplinary discharge planning process, led by the attending physician.  Recommendations may be updated based on patient status, additional functional criteria and insurance authorization. ?  ? ?Recommendations  ?Diet recommendations: Thin liquid;Dysphagia 1 (puree) ?Liquids provided via: Cup;Straw ?Medication Administration: Crushed with puree ?Supervision: Full  supervision/cueing for compensatory strategies;Staff to assist with self feeding;Patient able to self feed ?Compensations: Slow rate;Small sips/bites;Minimize environmental distractions ?Postural Changes and/or Swallow Maneuvers: Seated upright 90 degrees  ?   ? Patient may use Passy-Muir Speech Valve: During PO intake/meals;Intermittently with supervision ?PMSV Supervision: Full  ?   ? ? ? ? Oral Care Recommendations: Oral care BID ?Follow Up Recommendations: SLP at Long-term acute care hospital ?Assistance recommended at discharge: Frequent or constant Supervision/Assistance ?SLP Visit Diagnosis: Dysphagia, unspecified (R13.10) ?Plan: Continue with current plan of care ? ? ? ? ?  ?  ? ? ?Royce Macadamia ? ?07/22/2021, 10:49 AM ?

## 2021-07-22 NOTE — Progress Notes (Signed)
Pt is confused, pulling at lines, drains, and medical devices with existing BILAT wrist restraints.  Reoriented pt. Pt can not be reoriented. PRN klonopin given for anxiety. Pt appears anxious. Pt constantly yelling. Reassured pt that she was safe. Pt continues to yell. MD notified. New orders for Haldol 3mg  IV.  ?

## 2021-07-22 NOTE — Progress Notes (Signed)
TRH night cross cover note: ? ?I was notified by RN that the patient is confused/agitated, pulling at her medical equipment with these actions refractory to her existing soft bilateral wrist restraints, verbal redirection efforts, as well as to scheduled/as needed clonazepam as well as scheduled Seroquel.  Within this framework, this patient, who has documentation of recurrent delirium/acute encephalopathy during this hospitalization, is interfering with her own medical care, and posing a potential harm to herself on this basis.  As it appears that her soft restraints are optimized, verbal redirection efforts are optimized, will then focus on de-escalation of patient's potential harmful behavior from a pharmacologic standpoint.  Consequently, I have ordered Haldol 3 mg IV x1 dose. ? ? ? ?Newton Pigg, DO ?Hospitalist ? ?
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
## 2021-07-22 NOTE — Progress Notes (Signed)
Patient seen today by trach team for consult.  No education is needed at this time.  All necessary equipment is at beside.   Will continue to follow for progression.  

## 2021-07-22 NOTE — Progress Notes (Signed)
Physical Therapy Treatment ?Patient Details ?Name: Gina Bradley ?MRN: [id number] ?DOB: 07-30-1960 ?Today's Date: 07/22/2021 ? ? ?History of Present Illness 61 yo female former smoker presented to [facility] ER on 3/21 with AMS, fever.  Found to have multifocal pneumonia and required intubation and pressors.  Transferred to [facility] 06/25/21 for treatment of acute on chronic hypoxic/hypercapnic respiratory failure from PNA, COPD with acute COPD, paroxysmal SVT, acute on chronic diastolic CHF, and acute metabolic encephalopathy in presence of peripheral neuropathy, back pain and chronic opiate use. PMH: CAD, smoker, heart failure, GERD, type 2 DM, hypertension, SVT, peripheral neuropathy, steroid dependent COPD, chronic pain history of DVT, chronic lymphedema, spinal stenosis, was recently diagnosed with compression deformity of the L4 vertebrae and subacute fracture of the right inferior pubic rami seen in the ED on 06/06/2021 after fall. ? ?  ?PT Comments  ? ? Patient initially asleep, but able to arouse and participate in session. Patient able to assist to sit EOB and even stand at EOB! Pt too weak to safely transfer to [facility], therefore returned to bed and placed on bedpan. (RN and NT made aware). Overall, pt much better cognitively and participation much improved with frequency increased to allow continued functional improvement.  ?  ?Recommendations for follow up therapy are one component of a multi-disciplinary discharge planning process, led by the attending physician.  Recommendations may be updated based on patient status, additional functional criteria and insurance authorization. ? ?Follow Up Recommendations ? Skilled nursing-short term rehab (<3 hours/day) ?  ?  ?Assistance Recommended at Discharge Frequent or constant Supervision/Assistance  ?Patient can return home with the following Two people to help with walking and/or transfers;A lot of help with bathing/dressing/bathroom;Assistance with cooking/housework;Assistance  with feeding;Direct supervision/assist for medications management;Direct supervision/assist for financial management;Assist for transportation;Help with stairs or ramp for entrance ?  ?Equipment Recommendations ? BSC/3in1;Wheelchair (measurements PT);Wheelchair cushion (measurements PT);Hospital bed;Other (comment)  ?  ?Recommendations for Other Services   ? ? ?  ?Precautions / Restrictions Precautions ?Precautions: Fall ?Precaution Comments: hx of fall with L4 compression fx ?Restrictions ?Weight Bearing Restrictions: No  ?  ? ?Mobility ? Bed Mobility ?Overal bed mobility: Needs Assistance ?Bed Mobility: Rolling, Sidelying to Sit, Sit to Supine ?Rolling: Min assist (rt and left) ?Sidelying to sit: Mod assist, +2 for physical assistance, HOB elevated ?  ?Sit to supine: Min assist, HOB elevated ?  ?General bed mobility comments: from left side up to sit with assist to move legs over EOB and raise torso (after facilitated to start movements, she joined in and assisted); return to supine assist for trailing leg ?  ? ?Transfers ?Overall transfer level: Needs assistance ?Equipment used: None ?Transfers: Sit to/from Stand ?Sit to Stand: Mod assist, +2 physical assistance ?  ?  ?  ?  ?  ?General transfer comment: therapist on either side facing her to block knee and assist with hip/trunk extension to come to full stand (1st try ~3/4 standing and attempting pivot to BSC unsuccessful; 2nd stand stood fully ~15 seconds) ?  ? ?Ambulation/Gait ?  ?  ?  ?  ?  ?  ?  ?General Gait Details: unable ? ? ?Stairs ?  ?  ?  ?  ?  ? ? ?Wheelchair Mobility ?  ? ?Modified Rankin (Stroke Patients Only) ?  ? ? ?  ?Balance Overall balance assessment: Needs assistance ?Sitting-balance support: Feet unsupported ?Sitting balance-Leahy Scale: Fair ?Sitting balance - Comments: at EOB did not require UE support to maintain ?  ?Standing  balance support: No upper extremity supported, During functional activity ?Standing balance-Leahy Scale:  Poor ?Standing balance comment: +2 min assist without device ?  ?  ?  ?  ?  ?  ?  ?  ?  ?  ?  ?  ? ?  ?Cognition Arousal/Alertness: Awake/alert ?Behavior During Therapy: Restless ?Overall Cognitive Status: Impaired/Different from baseline ?Area of Impairment: Attention, Following commands, Memory, Safety/judgement, Awareness, Problem solving ?  ?  ?  ?  ?  ?  ?  ?  ?Orientation Level:  (NT; did ask what day it was) ?Current Attention Level: Sustained ?Memory: Decreased recall of precautions, Decreased short-term memory ?Following Commands: Follows one step commands with increased time, Follows one step commands consistently ?Safety/Judgement: Decreased awareness of safety, Decreased awareness of deficits ?Awareness: Intellectual ?Problem Solving: Slow processing, Difficulty sequencing, Requires verbal cues, Requires tactile cues ?General Comments: pt initiating wanting to get OOB to go to bathroom, but then gets internally distracted by being cold and stops her progress ?  ?  ? ?  ?Exercises   ? ?  ?General Comments   ?  ?  ? ?Pertinent Vitals/Pain Pain Assessment ?Pain Assessment: Faces ?Faces Pain Scale: Hurts a little bit ?Pain Location: reports her stomach is hurting; also relports she needs to have a BM ?Pain Descriptors / Indicators: Discomfort ?Pain Intervention(s): Limited activity within patient's tolerance, Repositioned  ? ? ?Home Living Family/patient expects to be discharged to:: Private residence ?Living Arrangements: Spouse/significant other ?Available Help at Discharge: Family;Available PRN/intermittently ?Type of Home: House ?Home Access: Stairs to enter ?  ?Entrance Stairs-Number of Steps: 1 ?  ?Home Layout: One level ?Home Equipment: Other (comment) (hemi walker) ?Additional Comments: per chart review, pt unable to report  ?  ?Prior Function    ?  ?  ?   ? ?PT Goals (current goals can now be found in the care plan section) Acute Rehab PT Goals ?Patient Stated Goal: wants to stand ?PT Goal  Formulation: Patient unable to participate in goal setting ?Time For Goal Achievement: 08/03/21 ?Potential to Achieve Goals: Fair ?Progress towards PT goals: Progressing toward goals ? ?  ?Frequency ? ? ? Min 3X/week ? ? ? ?  ?PT Plan Discharge plan needs to be updated;Frequency needs to be updated  ? ? ?Co-evaluation   ?  ?  ?  ?  ? ?  ?AM-PAC PT "6 Clicks" Mobility   ?Outcome Measure ? Help needed turning from your back to your side while in a flat bed without using bedrails?: A Little ?Help needed moving from lying on your back to sitting on the side of a flat bed without using bedrails?: Total ?Help needed moving to and from a bed to a chair (including a wheelchair)?: Total ?Help needed standing up from a chair using your arms (e.g., wheelchair or bedside chair)?: Total ?Help needed to walk in hospital room?: Total ?Help needed climbing 3-5 steps with a railing? : Total ?6 Click Score: 8 ? ?  ?End of Session Equipment Utilized During Treatment: Oxygen ?Activity Tolerance: Patient tolerated treatment well ?Patient left: in bed;with call bell/phone within reach;with bed alarm set;with restraints reapplied;Other (comment) (on bedpan) ?Nurse Communication: Mobility status;Patient requests pain meds (for headache) ?PT Visit Diagnosis: Unsteadiness on feet (R26.81);Muscle weakness (generalized) (M62.81);Other abnormalities of gait and mobility (R26.89);History of falling (Z91.81);Difficulty in walking, not elsewhere classified (R26.2) ?  ? ? ?Time: [id number] ?PT Time Calculation (min) (ACUTE ONLY): 28 min ? ?Charges:  $Therapeutic Activity: 23-37 mins          ?          ? ? ?  Arby Barrette, PT ?Acute Rehabilitation Services  ?Pager [phone] ?Office [phone] ? ? ? ?Jeanie Cooks Ariatna Jester ?07/22/2021, 11:55 AM ? ?

## 2021-07-23 DIAGNOSIS — G9341 Metabolic encephalopathy: Secondary | ICD-10-CM | POA: Diagnosis not present

## 2021-07-23 DIAGNOSIS — I5032 Chronic diastolic (congestive) heart failure: Secondary | ICD-10-CM | POA: Diagnosis not present

## 2021-07-23 DIAGNOSIS — R9389 Abnormal findings on diagnostic imaging of other specified body structures: Secondary | ICD-10-CM | POA: Diagnosis not present

## 2021-07-23 DIAGNOSIS — J9601 Acute respiratory failure with hypoxia: Secondary | ICD-10-CM | POA: Diagnosis not present

## 2021-07-23 LAB — GLUCOSE, CAPILLARY
Glucose-Capillary: 124 mg/dL — ABNORMAL HIGH (ref 70–99)
Glucose-Capillary: 179 mg/dL — ABNORMAL HIGH (ref 70–99)
Glucose-Capillary: 283 mg/dL — ABNORMAL HIGH (ref 70–99)
Glucose-Capillary: 152 mg/dL — ABNORMAL HIGH (ref 70–99)

## 2021-07-23 LAB — CBC
HCT: 31.9 % — ABNORMAL LOW (ref 36.0–46.0)
Hemoglobin: 9.7 g/dL — ABNORMAL LOW (ref 12.0–15.0)
MCH: 30.4 pg (ref 26.0–34.0)
MCHC: 30.4 g/dL (ref 30.0–36.0)
MCV: 100 fL (ref 80.0–100.0)
Platelets: 430 10*3/uL — ABNORMAL HIGH (ref 150–400)
RBC: 3.19 MIL/uL — ABNORMAL LOW (ref 3.87–5.11)
RDW: 19.5 % — ABNORMAL HIGH (ref 11.5–15.5)
WBC: 17.2 10*3/uL — ABNORMAL HIGH (ref 4.0–10.5)
nRBC: 0.3 % — ABNORMAL HIGH (ref 0.0–0.2)

## 2021-07-23 MED ORDER — CLONAZEPAM 0.5 MG PO TABS: 0.2500 mg | ORAL_TABLET | Freq: Two times a day (BID) | ORAL | Status: DC

## 2021-07-23 MED ORDER — CLONAZEPAM 0.25 MG PO TBDP
0.2500 mg | ORAL_TABLET | Freq: Two times a day (BID) | ORAL | Status: DC
  Administered 2021-07-23 – 2021-07-24 (×3): 0.25 mg
  Filled 2021-07-23 (×3): qty 1

## 2021-07-23 NOTE — Progress Notes (Signed)
TRH night cross cover note: ? ?I was contacted by RN requesting clarification on existing orders for CBG checks and associated sliding scale coverage.  Specifically, RN conveyed that while the patient was on tube feeds, she had been undergoing every 4 hours CBG monitoring with every 4 hours sliding scale coverage.  However, she is no longer receiving any tube feeds, with interval initiation of dysphagia 1 diet. ? ?Per my chart review, her CBG checks during today's dayshift reveal blood sugar range of 154 - 246.  In light of this information, I subsequently changed current order for every 4 hours CBGs with associated every 4 hours sliding scale coverage to CBG monitoring on a QAC/HS basis with associated low-dose sliding scale coverage.  ? ? ? ?Newton Pigg, DO ?Hospitalist ? ?

## 2021-07-23 NOTE — Progress Notes (Addendum)
?      ?                 PROGRESS NOTE ? ?      ?PATIENT DETAILS ?Name: Gina Bradley ?Age: 61 y.o. ?Sex: female ?Date of Birth: [birth year] ?Admit Date: 06/22/2021 ?Admitting Physician Rolla Plate, DO ?[id number], Purcell Nails, MD ? ?Brief Summary: ?Patient is a 61 y.o.  female with history of DM-2, HTN, SVT, HFpEF, steroid-dependent COPD, opiate dependence, recent compression fracture at L4/right inferior pubic rami fracture-who presented to [facility] ED with altered mental status, SOB with hypoxia, SVT-she was thought to have septic shock due to PNA-she was subsequently intubated-started on broad-spectrum antibiotics/pressors and managed in the ICU.  She was subsequently transferred to [facility] ICU-she was stabilized-however she had failure to wean and underwent a tracheostomy on 3/28.  While in the ICU-she developed fever requiring ID consultation-ICU delirium/encephalopathy requiring on and off infusion of Precedex.  After stability-she was transferred to [facility] on 4/11-where further hospital course was complicated by persistent encephalopathy and MSSA PNA (sputum cultures on 4/10).  She was managed with supportive care-with significant clearing of her encephalopathy.  See below for further details.   ? ?Significant events: ?3/21>> Admit to [facility], intubated, start pressors, cardiology consult for SVT, start amiodarone ?3/24>> transfer to [facility] ?3/28>> Trach for failure to wean ?4/11>> transfer to [facility] ?4/17>> trach downsized to #6 cuffless ?4/18>> SLP reeval and MBS completed-dysphagia 1 diet started. ?4/19>> eating around 40% of some of her meals-at times able to feed herself. ?4/20>> Cortak tube removed ?4/21>> completely awake/alert ? ?Significant studies: ?3/21>> CT head: No acute abnormalities. ?3/21>> CTA chest: No PE, multifocal pneumonia.  Multiple subacute/chronic rib fractures. ?3/21>> CT abdomen/pelvis: No acute finding-thickened endometrium. ?3/23>> RUQ ultrasound: No gallstones ?3/25>> Echo: EF 65-70% ?3/31>> MRI  brain: No acute intracranial abnormality. ?4/01>> CT chest: Extensive multifocal patchy airspace consolidations. ?4/12>> EEG: No seizures. ? ?Significant microbiology data: ?3/21>> COVID/influenza PCR: Negative ?3/21>> blood culture: No growth ?3/21>> urine culture: No growth ?3/22>> tracheal aspirate: No growth ?3/23>> urine culture: 1000 colonies/mL of Staph aureus ?3/23>> blood culture: Staph hominis (contamination) ?3/27>> tracheal aspirate: No growth ?3/31>> blood culture 1/2: Staph epidermidis (contamination) ?4/01>> tracheal aspirate: No growth ?4/03>> blood culture: No growth ?4/05>> respiratory virus panel: Meta pneumonia virus ?4/10>> tracheal aspirate: MSSA ? ?Antimicrobial therapy: ?Doxycyline 3/22 >> 3/24 ?Ceftriaxone 3/21 >>3/25 ?Azithromycin 3/21 x1 dose ?Vancomycin 3/24 >>3/25; 4/1>> 4/4, 4/10 >>4/13 ?Zosyn 3/25 >> 3/28 ?Cefepime 3/31>> 4/4 ?Diflucan 4/1>> 4/4 ?Cefazolin 4/13>> 4/19. ? ?Procedures: ?3/28>> tracheostomy ? ?Consults: ?ID, PCCM, cardiology ? ?Subjective: ?Awake/alert-sitting up in bed-sitting about exactly what happened to her and why she ended up in the hospital.  Much better mentation compared to the past few days. ? ?Objective: ?Vitals: ?Blood pressure (!) 134/56, pulse 98, temperature 99.2 ?F (37.3 ?C), temperature source Axillary, resp. rate (!) 22, height 5\' 5"  (1.651 m), weight 96.6 kg, last menstrual period 01/05/2015, SpO2 94 %.  ? ?Exam: ?Gen Exam:Alert awake-not in any distress ?HEENT:atraumatic, normocephalic ?Chest: B/L clear to auscultation anteriorly ?CVS:S1S2 regular ?Abdomen:soft non tender, non distended ?Extremities:no edema ?Neurology: Non focal ?Skin: no rash  ? ?Pertinent Labs/Radiology: ? ?  Latest Ref Rng & Units 07/23/2021  ?  2:18 AM 07/22/2021  ? 12:52 AM 07/21/2021  ?  4:21 AM  ?CBC  ?WBC 4.0 - 10.5 K/uL 17.2   23.7   21.4    ?Hemoglobin 12.0 - 15.0 g/dL 9.7   9.9   9.6    ?  Hematocrit 36.0 - 46.0 % 31.9   31.8   30.4    ?Platelets 150 - 400 K/uL 430   466   508     ?  ?Lab Results  ?Component Value Date  ? NA 134 (L) 07/22/2021  ? K 4.6 07/22/2021  ? CL 96 (L) 07/22/2021  ? CO2 26 07/22/2021  ? ?  ? ?Assessment/Plan: ?Septic shock: Sepsis physiology has resolved-clinically improved-leukocytosis still persists but has now started to downtrend.  Has completed several courses of IV antimicrobial therapy-see above. ? ?Acute on chronic hypoxic/hypercapnic respiratory failure due to multifocal pneumonia-s/p tracheostomy: Thought to be due to aspiration pneumonia/+/- metapneumovirus pneumonia.  Overall much better-PCCM following and managing trach-downgraded to #6 cuffless on 4/17.  Will await further recommendations from PCCM. ? ?Acute metabolic encephalopathy: Likely multifactorial etiology-due to PNA/ICU delirium/sepsis/polypharmacy.  Slow but significant improvement over the past several days-this morning she is completely awake and alert.  Continue Seroquel/primidone-will continue to titrate down Klonopin dosage further-decrease to 0.25 mg twice daily with the intention of discontinuing it over the next few days and just using it prn.  Remains on oxycodone-this has also been titrated down over the past few days-but suspect will need to continue on this current dosing as she is on chronic narcotics at home.  ? ?Dysphagia: Due to severe encephalopathy/recent trach-had a cortak tube in place-but with significant improvement in encephalopathy-cortak tubewas discontinued on 4/20.  Per nursing staff-significant improvement in her appetite.  Remains on a dysphagia 1 diet-SLP following.   ? ?Transaminitis: Unclear significance-could have been from sepsis-improved-RUQ ultrasound was without acute abnormalities. ? ?COPD: Continue bronchodilators-on chronic prednisone. ? ?[id number] cardioversion on initial presentation to the ED-subsequently required amiodarone infusion-currently maintaining sinus rhythm-on beta-blocker. ? ?Acute on chronic HFpEF: Volume status stable-continue  Aldactone-has required several doses of IV Lasix. ? ?Bilateral extremity lymphedema: Continues to have significant lower extremity edema-continue Aldactone -has required several doses of as needed IV Lasix. ? ?HTN: BP stable-continue metoprolol. ? ?HLD: Continue statin ? ?DM-2 (A1c 7.5 on 3/22): CBGs relatively stable-continue SSI ? ?Recent Labs  ?  07/22/21 ?2118 07/23/21 ?[id number] 07/23/21 ?1211  ?GLUCAP 110* 124* 179*  ? ?  ?GERD: Continue PPI ? ?Normocytic anemia: Due to acute illness-no evidence of blood loss-transfuse if Hb <7 ? ?Endometrial thickening on CT abdomen: Will require outpatient follow-up with GYN. ? ?Opiate dependence: Continue oxycodone-dosage has been gradually decreased to 10 mg every 8 hours.  She is on chronic narcotics at home.  ? ?Recent right pubic rami fracture: Supportive care at this point. ? ?Palliative care: Full code-was critically ill when she first presented-subsequently had prolonged hospital stay complicated by tracheostomy placement due to failure to wean off the ventilator, severe encephalopathy, dysphagia requiring NG tube placement.  Thankfully she has improved-dysphagia better-encephalopathy resolved to a certain extent-and is easily tolerating oral intake.  Palliative care followed closely-however there were issues in trying to get in touch with the patient's spouse-as he did not have a reliable number due to financial issues.  Patient's daughter Gina Bradley is reachable but the patient's spouse did not her being involved in the patient's care. Thankfully patient is now awake-alert and should be able to partake in own care and make relevant decisions. ? ? ?Nutrition Status: ?Nutrition Problem: Inadequate oral intake ?Etiology: inability to eat ?Signs/Symptoms: NPO status ?Interventions: Tube feeding, Prostat ? ?Obesity ?Estimated body mass index is 35.44 kg/m? as calculated from the following: ?  Height as of this encounter: 5\' 5"  (1.651 m). ?  Weight as of this encounter: 96.6 kg.   ? ?Code status: ?  Code Status: Full Code  ? ?DVT Prophylaxis: Prophylactic Lovenox ? ?Family Communication: Spouse-Gina Bradley-[phone] called-no response on 4/21. ? ? ?Disposition Plan: ?Status is: Inpat

## 2021-07-23 NOTE — Plan of Care (Signed)

## 2021-07-23 NOTE — Progress Notes (Signed)
Physical Therapy Treatment ?Patient Details ?Name: Gina Bradley ?MRN: [id number] ?DOB: 1960/11/12 ?Today's Date: 07/23/2021 ? ? ?History of Present Illness 61 yo female former smoker presented to [facility] ER on 3/21 with AMS, fever.  Found to have multifocal pneumonia and required intubation and pressors.  Transferred to [facility] 06/25/21 for treatment of acute on chronic hypoxic/hypercapnic respiratory failure from PNA, COPD with acute COPD, paroxysmal SVT, acute on chronic diastolic CHF, and acute metabolic encephalopathy in presence of peripheral neuropathy, back pain and chronic opiate use. PMH: CAD, smoker, heart failure, GERD, type 2 DM, hypertension, SVT, peripheral neuropathy, steroid dependent COPD, chronic pain history of DVT, chronic lymphedema, spinal stenosis, was recently diagnosed with compression deformity of the L4 vertebrae and subacute fracture of the right inferior pubic rami seen in the ED on 06/06/2021 after fall. ? ?  ?PT Comments  ? ? Pt agreeable to trying Stedy to get to recliner this afternoon. With beginning of mobilization, pt and bed found to be saturated in urine. Purewick in place obviously not working Pt reports she was aware but had not called for help. Pt is min physical assist but maximum multimodal cuing for getting to EoB amd modAx2 for powerup in [place]. Constant cuing for keeping hands on Stedy during transfer to chair. D/c plans remain appropriate. PT will continue to follow acutely. ?  ?Recommendations for follow up therapy are one component of a multi-disciplinary discharge planning process, led by the attending physician.  Recommendations may be updated based on patient status, additional functional criteria and insurance authorization. ? ?Follow Up Recommendations ? Skilled nursing-short term rehab (<3 hours/day) ?  ?  ?Assistance Recommended at Discharge Frequent or constant Supervision/Assistance  ?Patient can return home with the following Two people to help with walking and/or  transfers;A lot of help with bathing/dressing/bathroom;Assistance with cooking/housework;Assistance with feeding;Direct supervision/assist for medications management;Direct supervision/assist for financial management;Assist for transportation;Help with stairs or ramp for entrance ?  ?Equipment Recommendations ? BSC/3in1;Wheelchair (measurements PT);Wheelchair cushion (measurements PT);Hospital bed;Other (comment)  ?  ?Recommendations for Other Services   ? ? ?  ?Precautions / Restrictions Precautions ?Precautions: Fall ?Precaution Comments: hx of fall with L4 compression fx ?Restrictions ?Weight Bearing Restrictions: No  ?  ? ?Mobility ? Bed Mobility ?Overal bed mobility: Needs Assistance ?Bed Mobility: Supine to Sit ?  ?  ?Supine to sit: HOB elevated ?  ?  ?General bed mobility comments: minA for tactile cuing for movement of LE and reaching with UE to bedrail to pull self to side of bed ?  ? ?Transfers ?Overall transfer level: Needs assistance ?Equipment used: None ?Transfers: Sit to/from Stand ?Sit to Stand: +2 physical assistance, +2 safety/equipment, From elevated surface, Mod assist ?  ?  ?  ?  ?  ?General transfer comment: min physical assist and maximal multimodal cuing for hand placement on Stedy for powerup and pelvic rotation to come to fully upright for Stedy pad placement. ?  ? ?Ambulation/Gait ?  ?  ?  ?  ?  ?  ?  ?General Gait Details: unable ? ? ?  ? ? ?  ?Balance Overall balance assessment: Needs assistance ?Sitting-balance support: Feet unsupported ?Sitting balance-Leahy Scale: Fair ?Sitting balance - Comments: at EOB did not require UE support to maintain ?  ?Standing balance support: No upper extremity supported, During functional activity ?Standing balance-Leahy Scale: Poor ?Standing balance comment: +2 min assist without device ?  ?  ?  ?  ?  ?  ?  ?  ?  ?  ?  ?  ? ?  ?  Cognition Arousal/Alertness: Awake/alert ?Behavior During Therapy: Restless ?Overall Cognitive Status: Impaired/Different from  baseline ?Area of Impairment: Attention, Following commands, Memory, Safety/judgement, Awareness, Problem solving ?  ?  ?  ?  ?  ?  ?  ?  ?Orientation Level: Disoriented to, Time ?Current Attention Level: Sustained ?Memory: Decreased recall of precautions, Decreased short-term memory ?Following Commands: Follows one step commands with increased time, Follows one step commands consistently ?Safety/Judgement: Decreased awareness of safety, Decreased awareness of deficits ?Awareness: Intellectual ?Problem Solving: Slow processing, Difficulty sequencing, Requires verbal cues, Requires tactile cues ?General Comments: easily internally and externally distracted ?  ?  ? ?  ?   ?General Comments General comments (skin integrity, edema, etc.): VSS on trach collar 5L 28%FiO2 ?  ?  ? ?Pertinent Vitals/Pain Pain Assessment ?Pain Assessment: Faces ?Faces Pain Scale: No hurt  ? ? ? ?PT Goals (current goals can now be found in the care plan section) Acute Rehab PT Goals ?Patient Stated Goal: wants to stand ?PT Goal Formulation: Patient unable to participate in goal setting ?Time For Goal Achievement: 08/05/21 ?Potential to Achieve Goals: Fair ?Progress towards PT goals: Progressing toward goals ? ?  ?Frequency ? ? ? Min 3X/week ? ? ? ?  ?PT Plan Current plan remains appropriate  ? ? ?   ?AM-PAC PT "6 Clicks" Mobility   ?Outcome Measure ? Help needed turning from your back to your side while in a flat bed without using bedrails?: A Little ?Help needed moving from lying on your back to sitting on the side of a flat bed without using bedrails?: A Little ?Help needed moving to and from a bed to a chair (including a wheelchair)?: Total ?Help needed standing up from a chair using your arms (e.g., wheelchair or bedside chair)?: Total ?Help needed to walk in hospital room?: Total ?Help needed climbing 3-5 steps with a railing? : Total ?6 Click Score: 10 ? ?  ?End of Session Equipment Utilized During Treatment: Oxygen ?Activity Tolerance:  Patient tolerated treatment well ?Patient left: with call bell/phone within reach;in chair;with chair alarm set ?Nurse Communication: Mobility status ?PT Visit Diagnosis: Unsteadiness on feet (R26.81);Muscle weakness (generalized) (M62.81);Other abnormalities of gait and mobility (R26.89);History of falling (Z91.81);Difficulty in walking, not elsewhere classified (R26.2) ?  ? ? ?Time: [id number] ?PT Time Calculation (min) (ACUTE ONLY): 35 min ? ?Charges:  $Therapeutic Activity: 23-37 mins          ?          ? ?Sakari Raisanen B. Migdalia Dk PT, DPT ?Acute Rehabilitation Services ?Pager [phone] ?Office [phone] ? ? ? ?[place] ?07/23/2021, 5:34 PM ? ?

## 2021-07-23 NOTE — Progress Notes (Signed)
Occupational Therapy Treatment ?Patient Details ?Name: Gina Bradley ?MRN: [id number] ?DOB: 1960/05/29 ?Today's Date: 07/23/2021 ? ? ?History of present illness 61 yo female former smoker presented to [facility] ER on 3/21 with AMS, fever.  Found to have multifocal pneumonia and required intubation and pressors.  Transferred to [facility] 06/25/21 for treatment of acute on chronic hypoxic/hypercapnic respiratory failure from PNA, COPD with acute COPD, paroxysmal SVT, acute on chronic diastolic CHF, and acute metabolic encephalopathy in presence of peripheral neuropathy, back pain and chronic opiate use. PMH: CAD, smoker, heart failure, GERD, type 2 DM, hypertension, SVT, peripheral neuropathy, steroid dependent COPD, chronic pain history of DVT, chronic lymphedema, spinal stenosis, was recently diagnosed with compression deformity of the L4 vertebrae and subacute fracture of the right inferior pubic rami seen in the ED on 06/06/2021 after fall. ?  ?OT comments ? Pt with good progress during session, actively assisting with bathing tasks from the recliner with max assist overall for LB and min assist for UB.  She was able to stand with mod assist and mod demonstrational cueing for hand placement, while therapist assisted with washing front and back peri area.  Oxygen sats at 92% on 5Ls 28% trach collar with HR not increasing above 110 with activity.  Pt still confused as well with decreased safety awareness and decreased problem solving.  Will continue to follow for acute care.  Recommend SNF for follow-up rehab at this time.  ? ?Recommendations for follow up therapy are one component of a multi-disciplinary discharge planning process, led by the attending physician.  Recommendations may be updated based on patient status, additional functional criteria and insurance authorization. ?   ?Follow Up Recommendations ? Skilled nursing-short term rehab (<3 hours/day)  ?  ?Assistance Recommended at Discharge Frequent or constant  Supervision/Assistance  ?Patient can return home with the following ? A lot of help with walking and/or transfers;A lot of help with bathing/dressing/bathroom;Direct supervision/assist for medications management;Help with stairs or ramp for entrance;Assistance with cooking/housework;Assist for transportation;Direct supervision/assist for financial management ?  ?Equipment Recommendations ? Other (comment) (to be determined next venue of care)  ?  ?Recommendations for Other Services   ? ?  ?Precautions / Restrictions Precautions ?Precautions: Fall ?Precaution Comments: hx of fall with L4 compression fx ?Restrictions ?Weight Bearing Restrictions: No  ? ? ?  ? ?Mobility Bed Mobility ?  ?  ?  ?  ?  ?  ?  ?  ?  ? ?Transfers ?Overall transfer level: Needs assistance ?Equipment used: Rolling walker (2 wheels) ?Transfers: Sit to/from Stand ?Sit to Stand: Mod assist ?  ?  ?  ?  ?  ?General transfer comment: Mod demonstrational cueing for hand placement with sit to stand from the recliner during LB bathing tasks. ?  ?  ?Balance Overall balance assessment: Needs assistance ?Sitting-balance support: Feet unsupported ?Sitting balance-Leahy Scale: Fair ?  ?  ?Standing balance support: During functional activity, Reliant on assistive device for balance ?Standing balance-Leahy Scale: Poor ?Standing balance comment: Pt needs UE support on the RW for standing. ?  ?  ?  ?  ?  ?  ?  ?  ?  ?  ?  ?   ? ?ADL either performed or assessed with clinical judgement  ? ?ADL Overall ADL's : Needs assistance/impaired ?  ?  ?Grooming: Dance movement psychotherapist;Wash/dry hands;Set up ?Grooming Details (indicate cue type and reason): sitting in recliner ?Upper Body Bathing: Minimal assistance;Sitting ?  ?Lower Body Bathing: Maximal assistance;Sit to/from stand ?  ?  ?  ?  Lower Body Dressing: Maximal assistance ?Lower Body Dressing Details (indicate cue type and reason): gripper socks ?  ?  ?  ?  ?  ?  ?  ?General ADL Comments: Pt completed selfcare tasks from  bedside recliner.  Mod assist for sit to stand with therapist providing total assist for washing front and back peri area while pt concentrated on standing balance.  Standing endurance less than 1 min with O2 sats maintained at 92% on 5Ls 28% trach collar.  Dyspnea 3/4 post standing with HR at rest around 100 and increasing up to 110 with activity as well. ?  ? ? ?   ?   ?   ? ?Cognition Arousal/Alertness: Awake/alert ?Behavior During Therapy: [facility] for tasks assessed/performed ?Overall Cognitive Status: Impaired/Different from baseline ?Area of Impairment: Attention, Following commands, Memory, Safety/judgement, Awareness, Problem solving ?  ?  ?  ?  ?  ?  ?  ?  ?Orientation Level: Disoriented to, Time, Situation ?Current Attention Level: Sustained ?  ?Following Commands: Follows one step commands with increased time ?Safety/Judgement: Decreased awareness of safety, Decreased awareness of deficits ?Awareness: Intellectual ?Problem Solving: Difficulty sequencing, Requires tactile cues ?General Comments: Pt needed mod demonstrational cueing for correct placement of hands with sit to stand as she wanted to pull up no the RW.  . ?  ?  ?   ?   ?   ?   ? ? ?Pertinent Vitals/ Pain       Pain Assessment ?Pain Assessment: Faces ?Pain Score: 0-No pain ? ?   ?   ? ?Frequency ? Min 2X/week  ? ? ? ? ?  ?Progress Toward Goals ? ?OT Goals(current goals can now be found in the care plan section) ? Progress towards OT goals: Goals met and updated - see care plan ? ?Acute Rehab OT Goals ?Patient Stated Goal: Pt did not state but agreeable to ADL session with OT. ?OT Goal Formulation: With patient ?Time For Goal Achievement: 08/06/21 ?Potential to Achieve Goals: Good  ?Plan Discharge plan remains appropriate;Frequency remains appropriate   ? ?   ?AM-PAC OT "6 Clicks" Daily Activity     ?Outcome Measure ? ? Help from another person eating meals?: A Little ?Help from another person taking care of personal grooming?: A Little ?Help from  another person toileting, which includes using toliet, bedpan, or urinal?: A Lot ?Help from another person bathing (including washing, rinsing, drying)?: A Lot ?Help from another person to put on and taking off regular upper body clothing?: A Lot ?Help from another person to put on and taking off regular lower body clothing?: A Lot ?6 Click Score: 14 ? ?  ?End of Session Equipment Utilized During Treatment: Oxygen;Rolling walker (2 wheels) ? ?OT Visit Diagnosis: Other abnormalities of gait and mobility (R26.89);Muscle weakness (generalized) (M62.81);Other symptoms and signs involving cognitive function ?  ?Activity Tolerance Patient tolerated treatment well ?  ?Patient Left in chair;with call bell/phone within reach;with chair alarm set;Other (comment) (with respiratory therapist in the room) ?  ?Nurse Communication Mobility status ?  ? ?   ? ?Time: 9562-1308 ?OT Time Calculation (min): 39 min ? ?Charges: OT General Charges ?$OT Visit: 1 Visit ?OT Treatments ?$Self Care/Home Management : 38-52 mins ? ?Gina Bradley OTR/L ?07/23/2021, 4:07 PM ?

## 2021-07-24 ENCOUNTER — Inpatient Hospital Stay (HOSPITAL_COMMUNITY): Payer: 59

## 2021-07-24 DIAGNOSIS — J9602 Acute respiratory failure with hypercapnia: Secondary | ICD-10-CM | POA: Diagnosis not present

## 2021-07-24 DIAGNOSIS — J9601 Acute respiratory failure with hypoxia: Secondary | ICD-10-CM | POA: Diagnosis not present

## 2021-07-24 LAB — BASIC METABOLIC PANEL
Anion gap: 9 (ref 5–15)
BUN: 13 mg/dL (ref 6–20)
CO2: 27 mmol/L (ref 22–32)
Calcium: 9.1 mg/dL (ref 8.9–10.3)
Chloride: 99 mmol/L (ref 98–111)
Creatinine, Ser: 0.62 mg/dL (ref 0.44–1.00)
GFR, Estimated: >60 mL/min (ref >60)
Glucose, Bld: 148 mg/dL — ABNORMAL HIGH (ref 70–99)
Potassium: 4.5 mmol/L (ref 3.5–5.1)
Sodium: 135 mmol/L (ref 135–145)

## 2021-07-24 LAB — CBC
HCT: 31.5 % — ABNORMAL LOW (ref 36.0–46.0)
Hemoglobin: 9.4 g/dL — ABNORMAL LOW (ref 12.0–15.0)
MCH: 30.5 pg (ref 26.0–34.0)
MCHC: 29.8 g/dL — ABNORMAL LOW (ref 30.0–36.0)
MCV: 102.3 fL — ABNORMAL HIGH (ref 80.0–100.0)
Platelets: 415 10*3/uL — ABNORMAL HIGH (ref 150–400)
RBC: 3.08 MIL/uL — ABNORMAL LOW (ref 3.87–5.11)
RDW: 19.2 % — ABNORMAL HIGH (ref 11.5–15.5)
WBC: 12.3 10*3/uL — ABNORMAL HIGH (ref 4.0–10.5)
nRBC: 0.2 % (ref 0.0–0.2)

## 2021-07-24 LAB — GLUCOSE, CAPILLARY
Glucose-Capillary: 134 mg/dL — ABNORMAL HIGH (ref 70–99)
Glucose-Capillary: 236 mg/dL — ABNORMAL HIGH (ref 70–99)
Glucose-Capillary: 322 mg/dL — ABNORMAL HIGH (ref 70–99)
Glucose-Capillary: 94 mg/dL (ref 70–99)

## 2021-07-24 LAB — BRAIN NATRIURETIC PEPTIDE: B Natriuretic Peptide: 20.2 pg/mL (ref 0.0–100.0)

## 2021-07-24 LAB — MAGNESIUM: Magnesium: 1.8 mg/dL (ref 1.7–2.4)

## 2021-07-24 MED ORDER — GABAPENTIN 300 MG PO CAPS: 300.0000 mg | ORAL_CAPSULE | Freq: Three times a day (TID) | ORAL | Status: DC

## 2021-07-24 MED ORDER — QUETIAPINE FUMARATE 100 MG PO TABS
100.0000 mg | ORAL_TABLET | Freq: Two times a day (BID) | ORAL | Status: DC
  Administered 2021-07-25 – 2021-08-04 (×22): 100 mg via ORAL
  Filled 2021-07-24 (×22): qty 1

## 2021-07-24 MED ORDER — CLONAZEPAM 0.25 MG PO TBDP
0.2500 mg | ORAL_TABLET | Freq: Two times a day (BID) | ORAL | Status: DC
  Administered 2021-07-25 – 2021-08-04 (×22): 0.25 mg via ORAL
  Filled 2021-07-24 (×22): qty 1

## 2021-07-24 MED ORDER — PREDNISONE 5 MG PO TABS
5.0000 mg | ORAL_TABLET | Freq: Every day | ORAL | Status: DC
  Administered 2021-07-25 – 2021-08-04 (×11): 5 mg via ORAL
  Filled 2021-07-24 (×11): qty 1

## 2021-07-24 MED ORDER — HALOPERIDOL LACTATE 5 MG/ML IJ SOLN
2.0000 mg | Freq: Four times a day (QID) | INTRAMUSCULAR | Status: DC | PRN
  Administered 2021-07-27 – 2021-08-03 (×4): 2 mg via INTRAVENOUS
  Filled 2021-07-24 (×5): qty 1

## 2021-07-24 MED ORDER — GUAIFENESIN 100 MG/5ML PO LIQD
30.0000 mL | Freq: Four times a day (QID) | ORAL | Status: DC
  Administered 2021-07-25 – 2021-08-04 (×33): 30 mL via ORAL
  Filled 2021-07-24 (×48): qty 30

## 2021-07-24 MED ORDER — METOPROLOL TARTRATE 25 MG PO TABS
25.0000 mg | ORAL_TABLET | Freq: Two times a day (BID) | ORAL | Status: DC
  Administered 2021-07-25 – 2021-07-28 (×7): 25 mg via ORAL
  Filled 2021-07-24 (×7): qty 1

## 2021-07-24 MED ORDER — OXYCODONE HCL 5 MG PO TABS
10.0000 mg | ORAL_TABLET | Freq: Three times a day (TID) | ORAL | Status: DC
  Administered 2021-07-25 (×3): 10 mg via ORAL
  Filled 2021-07-24 (×4): qty 2

## 2021-07-24 MED ORDER — GABAPENTIN 300 MG PO CAPS
300.0000 mg | ORAL_CAPSULE | Freq: Three times a day (TID) | ORAL | Status: DC
  Administered 2021-07-24 – 2021-08-04 (×32): 300 mg via ORAL
  Filled 2021-07-24 (×33): qty 1

## 2021-07-24 MED ORDER — PANTOPRAZOLE 2 MG/ML SUSPENSION
40.0000 mg | Freq: Every day | ORAL | Status: DC
  Administered 2021-07-25 – 2021-08-04 (×11): 40 mg via ORAL
  Filled 2021-07-24 (×11): qty 20

## 2021-07-24 MED ORDER — ATORVASTATIN CALCIUM 40 MG PO TABS
40.0000 mg | ORAL_TABLET | Freq: Every day | ORAL | Status: DC
  Administered 2021-07-25 – 2021-08-04 (×11): 40 mg via ORAL
  Filled 2021-07-24 (×11): qty 1

## 2021-07-24 MED ORDER — ACETAMINOPHEN 325 MG PO TABS
650.0000 mg | ORAL_TABLET | ORAL | Status: DC | PRN
  Administered 2021-07-25 – 2021-07-30 (×8): 650 mg via ORAL
  Filled 2021-07-24 (×8): qty 2

## 2021-07-24 MED ORDER — ASPIRIN 81 MG PO CHEW
81.0000 mg | CHEWABLE_TABLET | Freq: Every day | ORAL | Status: DC
  Administered 2021-07-25 – 2021-08-04 (×11): 81 mg via ORAL
  Filled 2021-07-24 (×11): qty 1

## 2021-07-24 MED ORDER — PRIMIDONE 50 MG PO TABS
50.0000 mg | ORAL_TABLET | Freq: Every day | ORAL | Status: DC
  Administered 2021-07-25 – 2021-08-04 (×10): 50 mg via ORAL
  Filled 2021-07-24 (×13): qty 1

## 2021-07-24 MED ORDER — SENNA 8.6 MG PO TABS
1.0000 | ORAL_TABLET | Freq: Every day | ORAL | Status: DC
  Administered 2021-07-25: 8.6 mg via ORAL
  Filled 2021-07-24: qty 1

## 2021-07-24 NOTE — Progress Notes (Signed)
Pt is confused, restless, agitated, pulling oxygen off, pulling O2 probe off her finger.RN has attempted to reorient the patient several times and pt states " I do not want anything on me". Provider Kristine Royal DO notified.  ?

## 2021-07-24 NOTE — Progress Notes (Signed)
[facility] night cross cover note: ? ?I was notified by RN that patient is agitated, pulling off her supplemental oxygen as well as removing her pulse oximeter, with these behaviors refractory to attempts at verbal redirection as well as dose of prn clonazepam.  In the setting of associated interference with ongoing medical treatment posing potential harm to herself, I have placed orders for soft bilateral wrist restraints. ? ? ? ? ?Babs Bertin, DO ?Hospitalist ? ?

## 2021-07-24 NOTE — Progress Notes (Signed)
?      ?                 PROGRESS NOTE ? ?      ?PATIENT DETAILS ?Name: Gina Bradley ?Age: 61 y.o. ?Sex: female ?Date of Birth: 1960/08/26 ?Admit Date: 06/22/2021 ?Admitting Physician Rolla Plate, DO ?[id number], Purcell Nails, MD ? ?Brief Summary: ?Patient is a 61 y.o.  female with history of DM-2, HTN, SVT, HFpEF, steroid-dependent COPD, opiate dependence, recent compression fracture at L4/right inferior pubic rami fracture-who presented to [facility] ED with altered mental status, SOB with hypoxia, SVT-she was thought to have septic shock due to PNA-she was subsequently intubated-started on broad-spectrum antibiotics/pressors and managed in the ICU.  She was subsequently transferred to [facility] ICU-she was stabilized-however she had failure to wean and underwent a tracheostomy on 3/28.  While in the ICU-she developed fever requiring ID consultation-ICU delirium/encephalopathy requiring on and off infusion of Precedex.  After stability-she was transferred to [facility] on 4/11-where further hospital course was complicated by persistent encephalopathy and MSSA PNA (sputum cultures on 4/10).  She was managed with supportive care-with significant clearing of her encephalopathy.  See below for further details.   ? ?Significant events: ?3/21>> Admit to [facility], intubated, start pressors, cardiology consult for SVT, start amiodarone ?3/24>> transfer to [facility] ?3/28>> Trach for failure to wean ?4/11>> transfer to [facility] ?4/17>> trach downsized to #6 cuffless ?4/18>> SLP reeval and MBS completed-dysphagia 1 diet started. ?4/19>> eating around 40% of some of her meals-at times able to feed herself. ?4/20>> Cortak tube removed ?4/21>> completely awake/alert ? ?Significant studies: ?3/21>> CT head: No acute abnormalities. ?3/21>> CTA chest: No PE, multifocal pneumonia.  Multiple subacute/chronic rib fractures. ?3/21>> CT abdomen/pelvis: No acute finding-thickened endometrium. ?3/23>> RUQ ultrasound: No gallstones ?3/25>> Echo: EF 65-70% ?3/31>> MRI  brain: No acute intracranial abnormality. ?4/01>> CT chest: Extensive multifocal patchy airspace consolidations. ?4/12>> EEG: No seizures. ? ?Significant microbiology data: ?3/21>> COVID/influenza PCR: Negative ?3/21>> blood culture: No growth ?3/21>> urine culture: No growth ?3/22>> tracheal aspirate: No growth ?3/23>> urine culture: 1000 colonies/mL of Staph aureus ?3/23>> blood culture: Staph hominis (contamination) ?3/27>> tracheal aspirate: No growth ?3/31>> blood culture 1/2: Staph epidermidis (contamination) ?4/01>> tracheal aspirate: No growth ?4/03>> blood culture: No growth ?4/05>> respiratory virus panel: Meta pneumonia virus ?4/10>> tracheal aspirate: MSSA ? ?Antimicrobial therapy: ?Doxycyline 3/22 >> 3/24 ?Ceftriaxone 3/21 >>3/25 ?Azithromycin 3/21 x1 dose ?Vancomycin 3/24 >>3/25; 4/1>> 4/4, 4/10 >>4/13 ?Zosyn 3/25 >> 3/28 ?Cefepime 3/31>> 4/4 ?Diflucan 4/1>> 4/4 ?Cefazolin 4/13>> 4/19. ? ?Procedures: ?3/28>> tracheostomy ? ?Consults: ?ID, PCCM, cardiology ? ?Subjective:   Patient in bed, appears comfortable, denies any headache, no fever, no chest pain or pressure, no shortness of breath , no abdominal pain. No new focal weakness. ? ? ?Objective: ?Vitals: ?Blood pressure 132/67, pulse 92, temperature 97.9 ?F (36.6 ?C), temperature source Oral, resp. rate 14, height 5\' 5"  (1.651 m), weight 95.8 kg, last menstrual period 01/05/2015, SpO2 95 %.  ? ?Exam: ? ?Awake Alert, No new F.N deficits, Normal affect ?[place].AT,PERRAL, Trach site clear with TC ?Supple Neck, No JVD,   ?Symmetrical Chest wall movement, Good air movement bilaterally, CTAB ?RRR,No Gallops, Rubs or new Murmurs,  ?+ve B.Sounds, Abd Soft, No tenderness,   ?No Cyanosis, Clubbing or edema  ? ?  ? ?Assessment/Plan: ? ?Septic shock, acute on chronic hypoxic and hypercapnic respiratory failure due to multifocal aspiration pneumonia requiring tracheostomy in ICU : Sepsis physiology has resolved-clinically improved-leukocytosis still persists but has  now started to downtrend.  Chest pathophysiology has now resolved, she has completed her antibiotic treatment.  She also had viral infection with metapneumovirus.  She is currently on #6 cuffless trach with trach collar.  Clinically improving.  Continue to monitor.  PCCM managing trach site. ? ?Acute metabolic encephalopathy: Likely multifactorial etiology-due to PNA/ICU delirium/sepsis/polypharmacy.  Deficits much improved with supportive.  ? ?Dysphagia: Due to severe encephalopathy/recent trach-had a cortak tube in place-but with significant improvement in encephalopathy-cortak tubewas discontinued on 4/20.  Per nursing staff-significant improvement in her appetite.  Speech following on oral diet. ? ?Asymptomatic transaminitis: Unclear significance-could have been from sepsis-improved-RUQ ultrasound was without acute abnormalities. ? ?COPD: Continue bronchodilators-on chronic prednisone. ? ?[id number] cardioversion on initial presentation to the ED-subsequently required amiodarone infusion-currently maintaining sinus rhythm-on beta-blocker. ? ?Acute on chronic HFpEF,  EF preserved at 65%: Volume status stable-continue Aldactone- PRN IV Lasix. ? ?Bilateral extremity lymphedema: Continues to have significant lower extremity edema- K rising - DC Aldactone -has required several doses of as needed IV Lasix. ? ?HTN: BP stable-continue metoprolol. ? ?HLD: Continue statin ? ?DM-2 (A1c 7.5 on 3/22): CBGs relatively stable-continue SSI ? ?Recent Labs  ?  07/23/21 ?1550 07/23/21 ?2007 07/24/21 ?0756  ?GLUCAP 283* 152* 134*  ?  ?GERD: Continue PPI ? ?Normocytic anemia: Due to acute illness-no evidence of blood loss-transfuse if Hb <7 ? ?Endometrial thickening on CT abdomen: Will require outpatient follow-up with GYN. ? ?Opiate dependence: Continue oxycodone-dosage has been gradually decreased to 10 mg every 8 hours.  She is on chronic narcotics at home.  ? ?Recent right pubic rami fracture: Supportive care at this  point. ? ?Palliative care: She wishes to be full code decisions to be made by husband according to the patient. ? ? ?Nutrition Status: ?Nutrition Problem: Inadequate oral intake ?Etiology: inability to eat ?Signs/Symptoms: NPO status ?Interventions: Tube feeding, Prostat ? ?Obesity ?Estimated body mass index is 35.15 kg/m? as calculated from the following: ?  Height as of this encounter: 5\' 5"  (1.651 m). ?  Weight as of this encounter: 95.8 kg.  ? ?Code status: ?  Code Status: Full Code  ? ?DVT Prophylaxis: Prophylactic Lovenox ? ?Family Communication: Spouse-Tony-[phone] called-no response on 4/21. ? ? ?Disposition Plan: ?Status is: Inpatient ?Remains inpatient appropriate because: Resolving septic shock-encephalopathy-plan is now for SNF-but per social work-can only be placed in SNF 1 month after trach placed.   ?  ?Planned Discharge Destination: SNF. ? ? ?Diet: ?Diet Order   ? ?       ?  DIET - DYS 1 Room service appropriate? No; Fluid consistency: Thin  Diet effective now       ?  ? ?  ?  ? ?  ?  ? ? ?Antimicrobial agents: ?Anti-infectives (From admission, onward)  ? ? Start     Dose/Rate Route Frequency Ordered Stop  ? 07/15/21 1230  ceFAZolin (ANCEF) IVPB 2g/100 mL premix       ? 2 g ?200 mL/hr over 30 Minutes Intravenous Every 8 hours 07/15/21 1142 07/22/21 0656  ? 07/14/21 1200  vancomycin (VANCOREADY) IVPB 1500 mg/300 mL  Status:  Discontinued       ? 1,500 mg ?150 mL/hr over 120 Minutes Intravenous Every 24 hours 07/13/21 0845 07/15/21 1139  ? 07/13/21 0915  vancomycin (VANCOREADY) IVPB 2000 mg/400 mL       ? 2,000 mg ?200 mL/hr over 120 Minutes Intravenous  Once 07/13/21 0829 07/13/21 1053  ? 07/05/21 1015  fluconazole (DIFLUCAN) tablet 200 mg  Status:  Discontinued       ?  200 mg Oral Daily 07/05/21 0916 07/05/21 0917  ? 07/05/21 1015  fluconazole (DIFLUCAN) tablet 200 mg  Status:  Discontinued       ? 200 mg Per Tube Daily 07/05/21 0917 07/06/21 0924  ? 07/04/21 1800  vancomycin (VANCOREADY) IVPB  1500 mg/300 mL  Status:  Discontinued       ? 1,500 mg ?150 mL/hr over 120 Minutes Intravenous Every 24 hours 07/03/21 1638 07/06/21 0924  ? 07/03/21 1800  vancomycin (VANCOREADY) IVPB 2000 mg/400 mL       ?

## 2021-07-25 ENCOUNTER — Inpatient Hospital Stay (HOSPITAL_COMMUNITY): Payer: 59

## 2021-07-25 DIAGNOSIS — G9341 Metabolic encephalopathy: Secondary | ICD-10-CM | POA: Diagnosis not present

## 2021-07-25 DIAGNOSIS — Z7189 Other specified counseling: Secondary | ICD-10-CM | POA: Diagnosis not present

## 2021-07-25 DIAGNOSIS — J189 Pneumonia, unspecified organism: Secondary | ICD-10-CM | POA: Diagnosis not present

## 2021-07-25 DIAGNOSIS — J9601 Acute respiratory failure with hypoxia: Secondary | ICD-10-CM | POA: Diagnosis not present

## 2021-07-25 DIAGNOSIS — J9602 Acute respiratory failure with hypercapnia: Secondary | ICD-10-CM | POA: Diagnosis not present

## 2021-07-25 LAB — COMPREHENSIVE METABOLIC PANEL
ALT: 22 U/L (ref 0–44)
AST: 46 U/L — ABNORMAL HIGH (ref 15–41)
Albumin: 2.9 g/dL — ABNORMAL LOW (ref 3.5–5.0)
Alkaline Phosphatase: 96 U/L (ref 38–126)
Anion gap: 11 (ref 5–15)
BUN: 10 mg/dL (ref 6–20)
CO2: 24 mmol/L (ref 22–32)
Calcium: 9.3 mg/dL (ref 8.9–10.3)
Chloride: 100 mmol/L (ref 98–111)
Creatinine, Ser: 0.62 mg/dL (ref 0.44–1.00)
GFR, Estimated: >60 mL/min (ref >60)
Glucose, Bld: 115 mg/dL — ABNORMAL HIGH (ref 70–99)
Potassium: 3.9 mmol/L (ref 3.5–5.1)
Sodium: 135 mmol/L (ref 135–145)
Total Bilirubin: 0.6 mg/dL (ref 0.3–1.2)
Total Protein: 6.4 g/dL — ABNORMAL LOW (ref 6.5–8.1)

## 2021-07-25 LAB — CBC WITH DIFFERENTIAL/PLATELET
Abs Immature Granulocytes: 0.59 10*3/uL — ABNORMAL HIGH (ref 0.00–0.07)
Basophils Absolute: 0.1 10*3/uL (ref 0.0–0.1)
Basophils Relative: 1 %
Eosinophils Absolute: 0.5 10*3/uL (ref 0.0–0.5)
Eosinophils Relative: 4 %
HCT: 32 % — ABNORMAL LOW (ref 36.0–46.0)
Hemoglobin: 9.9 g/dL — ABNORMAL LOW (ref 12.0–15.0)
Immature Granulocytes: 5 %
Lymphocytes Relative: 28 %
Lymphs Abs: 3.4 10*3/uL (ref 0.7–4.0)
MCH: 31.1 pg (ref 26.0–34.0)
MCHC: 30.9 g/dL (ref 30.0–36.0)
MCV: 100.6 fL — ABNORMAL HIGH (ref 80.0–100.0)
Monocytes Absolute: 1.3 10*3/uL — ABNORMAL HIGH (ref 0.1–1.0)
Monocytes Relative: 11 %
Neutro Abs: 6.5 10*3/uL (ref 1.7–7.7)
Neutrophils Relative %: 51 %
Platelets: 394 10*3/uL (ref 150–400)
RBC: 3.18 MIL/uL — ABNORMAL LOW (ref 3.87–5.11)
RDW: 18.7 % — ABNORMAL HIGH (ref 11.5–15.5)
WBC: 12.4 10*3/uL — ABNORMAL HIGH (ref 4.0–10.5)
nRBC: 0.2 % (ref 0.0–0.2)

## 2021-07-25 LAB — GLUCOSE, CAPILLARY
Glucose-Capillary: 147 mg/dL — ABNORMAL HIGH (ref 70–99)
Glucose-Capillary: 140 mg/dL — ABNORMAL HIGH (ref 70–99)
Glucose-Capillary: 196 mg/dL — ABNORMAL HIGH (ref 70–99)
Glucose-Capillary: 143 mg/dL — ABNORMAL HIGH (ref 70–99)
Glucose-Capillary: 133 mg/dL — ABNORMAL HIGH (ref 70–99)

## 2021-07-25 LAB — BRAIN NATRIURETIC PEPTIDE: B Natriuretic Peptide: 14.8 pg/mL (ref 0.0–100.0)

## 2021-07-25 LAB — TROPONIN I (HIGH SENSITIVITY): Troponin I (High Sensitivity): 5 ng/L (ref <18)

## 2021-07-25 LAB — MAGNESIUM: Magnesium: 1.6 mg/dL — ABNORMAL LOW (ref 1.7–2.4)

## 2021-07-25 MED ORDER — LACTULOSE 10 GM/15ML PO SOLN
30.0000 g | Freq: Once | ORAL | Status: AC
  Administered 2021-07-25: 30 g via ORAL
  Filled 2021-07-25: qty 45

## 2021-07-25 MED ORDER — DOCUSATE SODIUM 100 MG PO CAPS
200.0000 mg | ORAL_CAPSULE | Freq: Two times a day (BID) | ORAL | Status: DC
  Administered 2021-07-25 – 2021-08-02 (×17): 200 mg via ORAL
  Filled 2021-07-25 (×18): qty 2

## 2021-07-25 MED ORDER — BISACODYL 10 MG RE SUPP
10.0000 mg | Freq: Once | RECTAL | Status: AC
  Administered 2021-07-25: 10 mg via RECTAL
  Filled 2021-07-25: qty 1

## 2021-07-25 MED ORDER — POTASSIUM CHLORIDE 20 MEQ PO PACK
40.0000 meq | PACK | ORAL | Status: AC
  Administered 2021-07-25 (×3): 40 meq via ORAL
  Filled 2021-07-25 (×3): qty 2

## 2021-07-25 MED ORDER — NITROGLYCERIN 0.4 MG SL SUBL
0.4000 mg | SUBLINGUAL_TABLET | SUBLINGUAL | Status: DC | PRN
Start: 2021-07-25 — End: 2021-08-05
  Administered 2021-08-02: 0.4 mg via SUBLINGUAL
  Filled 2021-07-25: qty 1

## 2021-07-25 MED ORDER — POLYETHYLENE GLYCOL 3350 17 G PO PACK
17.0000 g | PACK | Freq: Two times a day (BID) | ORAL | Status: DC
  Administered 2021-07-25 – 2021-08-04 (×15): 17 g via ORAL
  Filled 2021-07-25 (×17): qty 1

## 2021-07-25 MED ORDER — FUROSEMIDE 10 MG/ML IJ SOLN
20.0000 mg | Freq: Once | INTRAMUSCULAR | Status: AC
  Administered 2021-07-25: 20 mg via INTRAVENOUS
  Filled 2021-07-25: qty 2

## 2021-07-25 MED ORDER — MAGNESIUM SULFATE 4 GM/100ML IV SOLN
4.0000 g | Freq: Once | INTRAVENOUS | Status: AC
  Administered 2021-07-25: 4 g via INTRAVENOUS
  Filled 2021-07-25: qty 100

## 2021-07-25 MED ORDER — NITROGLYCERIN 0.4 MG SL SUBL
SUBLINGUAL_TABLET | SUBLINGUAL | Status: AC
  Filled 2021-07-25: qty 3

## 2021-07-25 NOTE — Progress Notes (Signed)
0400 EKG at bedside and chest xray at bedside.  ?

## 2021-07-25 NOTE — Progress Notes (Signed)
Pt c/o  abd pain, nausea and abd pressure. MD made aware, abd CXR, bowel regimen and clear liquid diet ordered. Doculax suppository given 1 hour ago,  no bowel movement produced.  ?

## 2021-07-25 NOTE — Progress Notes (Signed)
?      ?                 PROGRESS NOTE ? ?      ?PATIENT DETAILS ?Name: Gina HOVSEPIAN ?Age: 61 y.o. ?Sex: female ?Date of Birth: [birth year] ?Admit Date: 06/22/2021 ?Admitting Physician Rolla Plate, DO ?[id number], Purcell Nails, MD ? ?Brief Summary: ?Patient is a 61 y.o.  female with history of DM-2, HTN, SVT, HFpEF, steroid-dependent COPD, opiate dependence, recent compression fracture at L4/right inferior pubic rami fracture-who presented to [facility] ED with altered mental status, SOB with hypoxia, SVT-she was thought to have septic shock due to PNA-she was subsequently intubated-started on broad-spectrum antibiotics/pressors and managed in the ICU.  She was subsequently transferred to [facility] ICU-she was stabilized-however she had failure to wean and underwent a tracheostomy on 3/28.  While in the ICU-she developed fever requiring ID consultation-ICU delirium/encephalopathy requiring on and off infusion of Precedex.  After stability-she was transferred to [facility] on 4/11-where further hospital course was complicated by persistent encephalopathy and MSSA PNA (sputum cultures on 4/10).  She was managed with supportive care-with significant clearing of her encephalopathy.  See below for further details.   ? ?Significant events: ?3/21>> Admit to [facility], intubated, start pressors, cardiology consult for SVT, start amiodarone ?3/24>> transfer to [facility] ?3/28>> Trach for failure to wean ?4/11>> transfer to [facility] ?4/17>> trach downsized to #6 cuffless ?4/18>> SLP reeval and MBS completed-dysphagia 1 diet started. ?4/19>> eating around 40% of some of her meals-at times able to feed herself. ?4/20>> Cortak tube removed ?4/21>> completely awake/alert ? ?Significant studies: ?3/21>> CT head: No acute abnormalities. ?3/21>> CTA chest: No PE, multifocal pneumonia.  Multiple subacute/chronic rib fractures. ?3/21>> CT abdomen/pelvis: No acute finding-thickened endometrium. ?3/23>> RUQ ultrasound: No gallstones ?3/25>> Echo: EF 65-70% ?3/31>> MRI  brain: No acute intracranial abnormality. ?4/01>> CT chest: Extensive multifocal patchy airspace consolidations. ?4/12>> EEG: No seizures. ? ?Significant microbiology data: ?3/21>> COVID/influenza PCR: Negative ?3/21>> blood culture: No growth ?3/21>> urine culture: No growth ?3/22>> tracheal aspirate: No growth ?3/23>> urine culture: 1000 colonies/mL of Staph aureus ?3/23>> blood culture: Staph hominis (contamination) ?3/27>> tracheal aspirate: No growth ?3/31>> blood culture 1/2: Staph epidermidis (contamination) ?4/01>> tracheal aspirate: No growth ?4/03>> blood culture: No growth ?4/05>> respiratory virus panel: Meta pneumonia virus ?4/10>> tracheal aspirate: MSSA ? ?Antimicrobial therapy: ?Doxycyline 3/22 >> 3/24 ?Ceftriaxone 3/21 >>3/25 ?Azithromycin 3/21 x1 dose ?Vancomycin 3/24 >>3/25; 4/1>> 4/4, 4/10 >>4/13 ?Zosyn 3/25 >> 3/28 ?Cefepime 3/31>> 4/4 ?Diflucan 4/1>> 4/4 ?Cefazolin 4/13>> 4/19. ? ?Procedures: ?3/28>> tracheostomy ? ?Consults: ?ID, PCCM, cardiology ? ?Subjective:   Patient in bed, appears comfortable, denies any headache, no fever, no chest pain or pressure, no shortness of breath , no abdominal pain. No new focal weakness. ? ? ?Objective: ?Vitals: ?Blood pressure (!) 159/95, pulse 97, temperature 98.5 ?F (36.9 ?C), temperature source Oral, resp. rate 19, height 5\' 5"  (1.651 m), weight 95.8 kg, last menstrual period 01/05/2015, SpO2 93 %.  ? ?Exam: ? ?Awake Alert, No new F.N deficits, Normal affect ?[place].AT,PERRAL ?Supple Neck, Trach site clear with PMV & TC  ?Symmetrical Chest wall movement, Good air movement bilaterally, CTAB ?RRR,No Gallops, Rubs or new Murmurs,  ?+ve B.Sounds, Abd Soft, No tenderness,   ?No Cyanosis, Clubbing or edema  ? ? ?  ? ?Assessment/Plan: ? ?Septic shock, acute on chronic hypoxic and hypercapnic respiratory failure due to multifocal aspiration pneumonia requiring tracheostomy in ICU : Sepsis physiology has resolved-clinically improved-leukocytosis still persists but has  now started to  downtrend.  Chest pathophysiology has now resolved, she has completed her antibiotic treatment.  She also had viral infection with metapneumovirus.  She is currently on #6 cuffless trach with trach collar.  Clinically improving.  Continue to monitor.  PCCM managing trach site. ? ?Acute metabolic encephalopathy: Likely multifactorial etiology-due to PNA/ICU delirium/sepsis/polypharmacy.  Deficits much improved with supportive.  ? ?Dysphagia: Due to severe encephalopathy/recent trach-had a cortak tube in place-but with significant improvement in encephalopathy-cortak tubewas discontinued on 4/20.  Per nursing staff-significant improvement in her appetite.  Speech following on oral diet. ? ?Asymptomatic transaminitis: Unclear significance-could have been from sepsis-improved-RUQ ultrasound was without acute abnormalities. ? ?COPD: Continue bronchodilators-on chronic prednisone. ? ?MGQ:[id number] cardioversion on initial presentation to the ED-subsequently required amiodarone infusion-currently maintaining sinus rhythm-on beta-blocker. ? ?Acute on chronic HFpEF,  EF preserved at 65%: Volume status stable-continue Aldactone- PRN IV Lasix. ? ?Bilateral extremity lymphedema: Continues to have significant lower extremity edema- K rising - DC Aldactone -has required several doses of as needed IV Lasix. ? ?HTN: BP stable-continue metoprolol. ? ?HLD: Continue statin ?  ?GERD: Continue PPI ? ?Normocytic anemia: Due to acute illness-no evidence of blood loss-transfuse if Hb <7 ? ?Endometrial thickening on CT abdomen: Will require outpatient follow-up with GYN. ? ?Opiate dependence: Continue oxycodone-dosage has been gradually decreased to 10 mg every 8 hours.  She is on chronic narcotics at home.  ? ?Recent right pubic rami fracture: Supportive care at this point. ? ?Hypomagnesemia.  Replaced.   ? ?Palliative care: She wishes to be full code decisions to be made by husband according to the patient. ? ? ?DM-2  (A1c 7.5 on 3/22): CBGs relatively stable-continue SSI ? ?CBG (last 3)  ?Recent Labs  ?  07/24/21 ?2102 07/25/21 ?0716 07/25/21 ?0749  ?GLUCAP 94 147* 140*  ? ? ? ? ?Nutrition Status: ?Nutrition Problem: Inadequate oral intake ?Etiology: inability to eat ?Signs/Symptoms: NPO status ?Interventions: Tube feeding, Prostat ? ?Obesity ?Estimated body mass index is 35.15 kg/m? as calculated from the following: ?  Height as of this encounter: 5\' 5"  (1.651 m). ?  Weight as of this encounter: 95.8 kg.  ? ?Code status: ?  Code Status: Full Code  ? ?DVT Prophylaxis: Prophylactic Lovenox ? ?Family Communication: Spouse-[phone] called-no response on 4/21. ? ? ?Disposition Plan: ?Status is: Inpatient ?Remains inpatient appropriate because: Resolving septic shock-encephalopathy-plan is now for SNF-but per social work-can only be placed in SNF 1 month after trach placed.   ?  ?Planned Discharge Destination: SNF. ? ? ?Diet: ?Diet Order   ? ?       ?  DIET DYS 3 Room service appropriate? Yes with Assist; Fluid consistency: Thin  Diet effective now       ?  ? ?  ?  ? ?  ?  ? ? ?Antimicrobial agents: ?Anti-infectives (From admission, onward)  ? ? Start     Dose/Rate Route Frequency Ordered Stop  ? 07/15/21 1230  ceFAZolin (ANCEF) IVPB 2g/100 mL premix       ? 2 g ?200 mL/hr over 30 Minutes Intravenous Every 8 hours 07/15/21 1142 07/22/21 0656  ? 07/14/21 1200  vancomycin (VANCOREADY) IVPB 1500 mg/300 mL  Status:  Discontinued       ? 1,500 mg ?150 mL/hr over 120 Minutes Intravenous Every 24 hours 07/13/21 0845 07/15/21 1139  ? 07/13/21 0915  vancomycin (VANCOREADY) IVPB 2000 mg/400 mL       ? 2,000 mg ?200 mL/hr over 120 Minutes Intravenous  Once 07/13/21 0829 07/13/21  1053  ? 07/05/21 1015  fluconazole (DIFLUCAN) tablet 200 mg  Status:  Discontinued       ? 200 mg Oral Daily 07/05/21 0916 07/05/21 0917  ? 07/05/21 1015  fluconazole (DIFLUCAN) tablet 200 mg  Status:  Discontinued       ? 200 mg Per Tube Daily 07/05/21 0917  07/06/21 0924  ? 07/04/21 1800  vancomycin (VANCOREADY) IVPB 1500 mg/300 mL  Status:  Discontinued       ? 1,500 mg ?150 mL/hr over 120 Minutes Intravenous Every 24 hours 07/03/21 1638 07/06/21 0924  ? 04

## 2021-07-25 NOTE — Progress Notes (Signed)
This RN found patients dentures in her bed and put the dentures on her side table. Patient states her dentures are now missing. THis RN looked in her bed, on the table and around the room and was unable to find dentures. The kitchen was called to see if they were taken on the patient's lunch tray. Kitchen states they haven't received 5W dirty trays yet but that they would call us of any dentures are found. Patient's son called and asked if he took the dentures home, pt's son states he did not. Will continue to look.  ?
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
## 2021-07-25 NOTE — Progress Notes (Signed)
[facility] night cross cover note: ? ?I was notified by RN that patient has awoken this morning with new onset chest pain, in the absence of any associated symptoms.  Patient conveys that this is new for her, having not experienced similar chest pain over the course of her 33-day hospitalization.  Vital signs stable, including normotensive blood pressures.  ? ?Checking EKG, stat chest x-ray, serial troponin x2 values.  Also placed order for as needed sublingual nitroglycerin. ? ? ? ?Newton Pigg, DO ?Hospitalist ? ?

## 2021-07-25 NOTE — Progress Notes (Signed)
Speech Language Pathology Treatment: Dysphagia  ?Patient Details ?Name: Gina Bradley ?MRN: [id number] ?DOB: September 19, 1960 ?Today's Date: 07/25/2021 ?Time: [id number] ?SLP Time Calculation (min) (ACUTE ONLY): 22 min ? ?Assessment / Plan / Recommendation ?Clinical Impression ? Ms. Kolakowski communication and swallowing have improved dramatically.  PMV in place upon entering room. She is quite verbose, acknowledges she has trouble "focusing" and is impulsive.  Asking for different foods on her tray.  Pt cleaned/placed dentures.  She consumed trials of saltines and demonstrated improved oral attention and mastication.  There was no oral residue post-swallow.  Recommend advancing diet to dysphagia 3/thin liquids; give meds whole in puree.   ? ?SLP will follow for PMV/diet progression/safety. ?D/W RN. ?  ?HPI HPI: 61 yo female former smoker presented to [facility] ER on 3/21 with AMS, fever.  Found to have multifocal pneumonia and required intubation and pressors.  Transferred to [facility] for further management. ETT 3/21; trach 3/28 secondary to failure to wean; TC trials 4/8. Dx acute metabolic encephalopathy, pmhx peripheral neuropathy, HF, HTN, DM2. ?  ?   ?SLP Plan ? Continue with current plan of care ? ?  ?  ?Recommendations for follow up therapy are one component of a multi-disciplinary discharge planning process, led by the attending physician.  Recommendations may be updated based on patient status, additional functional criteria and insurance authorization. ?  ? ?Recommendations  ?Diet recommendations: Dysphagia 3 (mechanical soft);Thin liquid ?Liquids provided via: Cup;Straw ?Medication Administration: Whole meds with puree ?Supervision: Intermittent supervision to cue for compensatory strategies ?Compensations: Minimize environmental distractions ?Postural Changes and/or Swallow Maneuvers: Seated upright 90 degrees  ?   ? Patient may use Passy-Muir Speech Valve: During all waking hours (remove during sleep);During PO  intake/meals ?PMSV Supervision: Intermittent  ?   ? ? ? ? Oral Care Recommendations: Oral care BID ?Follow Up Recommendations: SLP at Long-term acute care hospital ?Assistance recommended at discharge: Frequent or constant Supervision/Assistance ?SLP Visit Diagnosis: Dysphagia, unspecified (R13.10) ?Plan: Continue with current plan of care ? ? ? ? ?  ?  ?Brok Stocking L. Mouhamad Teed, MA CCC/SLP ?Acute Rehabilitation Services ?Office number [phone] ?Pager [phone] ? ? ?Juan Quam Laurice ? ?07/25/2021, 9:18 AM ?

## 2021-07-25 NOTE — Progress Notes (Signed)
[facility] night cross cover note: ? ?EKG performed and, in comparison to most recent prior EKG from 07/23/2021, shows sinus tachycardia with heart rate 104, normal intervals, and interval development of nonspecific T wave flattening in V2; less than 1 mm ST elevation in leads II, 3, aVF, V5, V6, are all unchanged from most recent prior EKG, with no evidence of interval ST changes. ? ? ?Newton Pigg, DO ?Hospitalist  ? ?

## 2021-07-25 NOTE — Significant Event (Addendum)
Rapid Response Event Note  ? ?Reason for Call :  ?CP ? ?Initial Focused Assessment:  ?Pt sitting up in bed, anxious, asking for something to help her anxiety. She says her chest pain is better after 1 ntg sl. Lungs diminished t/o. Skin warm and dry.  ? ?T-97.9, HR-97, BP-117/85, RR-16, SpO2-96% on .28 TC. ? ? ?Interventions:  ?EKG-ST, some STE in II, III, AVF, V5, V6 (<66mm)-present in previous EKG ?PCXR-Increased opacity in the lower lung fields which could be due to worsening pneumonia or decreased inspiration. Other lung opacities are unchanged. ?NTG SL x 1 with resolution of CP ?Trop ?Plan of Care:  ?Pt is CP free after 1 SL NTG. Await Trop results. Continue to monitor pt. Call RRT if further assistance needed.  ? ?Event Summary:  ? ?MD Notified: Dr. Arlean Hopping ?Call [id number] ?Arrival [id number] ?End Time:0450 ? ?Terrilyn Saver, RN ?

## 2021-07-26 DIAGNOSIS — J9602 Acute respiratory failure with hypercapnia: Secondary | ICD-10-CM | POA: Diagnosis not present

## 2021-07-26 DIAGNOSIS — J9601 Acute respiratory failure with hypoxia: Secondary | ICD-10-CM | POA: Diagnosis not present

## 2021-07-26 DIAGNOSIS — Z7189 Other specified counseling: Secondary | ICD-10-CM | POA: Diagnosis not present

## 2021-07-26 DIAGNOSIS — K2100 Gastro-esophageal reflux disease with esophagitis, without bleeding: Secondary | ICD-10-CM | POA: Diagnosis not present

## 2021-07-26 DIAGNOSIS — G9341 Metabolic encephalopathy: Secondary | ICD-10-CM | POA: Diagnosis not present

## 2021-07-26 LAB — TROPONIN I (HIGH SENSITIVITY)
Troponin I (High Sensitivity): 7 ng/L (ref <18)
Troponin I (High Sensitivity): 6 ng/L (ref <18)

## 2021-07-26 LAB — CBC WITH DIFFERENTIAL/PLATELET
Abs Immature Granulocytes: 0.31 10*3/uL — ABNORMAL HIGH (ref 0.00–0.07)
Basophils Absolute: 0.1 10*3/uL (ref 0.0–0.1)
Basophils Relative: 1 %
Eosinophils Absolute: 0.4 10*3/uL (ref 0.0–0.5)
Eosinophils Relative: 3 %
HCT: 32.5 % — ABNORMAL LOW (ref 36.0–46.0)
Hemoglobin: 10.2 g/dL — ABNORMAL LOW (ref 12.0–15.0)
Immature Granulocytes: 3 %
Lymphocytes Relative: 26 %
Lymphs Abs: 3.2 10*3/uL (ref 0.7–4.0)
MCH: 30.9 pg (ref 26.0–34.0)
MCHC: 31.4 g/dL (ref 30.0–36.0)
MCV: 98.5 fL (ref 80.0–100.0)
Monocytes Absolute: 1.3 10*3/uL — ABNORMAL HIGH (ref 0.1–1.0)
Monocytes Relative: 10 %
Neutro Abs: 7 10*3/uL (ref 1.7–7.7)
Neutrophils Relative %: 57 %
Platelets: 403 10*3/uL — ABNORMAL HIGH (ref 150–400)
RBC: 3.3 MIL/uL — ABNORMAL LOW (ref 3.87–5.11)
RDW: 18.5 % — ABNORMAL HIGH (ref 11.5–15.5)
WBC: 12.2 10*3/uL — ABNORMAL HIGH (ref 4.0–10.5)
nRBC: 0.2 % (ref 0.0–0.2)

## 2021-07-26 LAB — COMPREHENSIVE METABOLIC PANEL
ALT: 26 U/L (ref 0–44)
AST: 44 U/L — ABNORMAL HIGH (ref 15–41)
Albumin: 3 g/dL — ABNORMAL LOW (ref 3.5–5.0)
Alkaline Phosphatase: 98 U/L (ref 38–126)
Anion gap: 11 (ref 5–15)
BUN: 9 mg/dL (ref 6–20)
CO2: 25 mmol/L (ref 22–32)
Calcium: 9.2 mg/dL (ref 8.9–10.3)
Chloride: 99 mmol/L (ref 98–111)
Creatinine, Ser: 0.67 mg/dL (ref 0.44–1.00)
GFR, Estimated: >60 mL/min (ref >60)
Glucose, Bld: 120 mg/dL — ABNORMAL HIGH (ref 70–99)
Potassium: 4.4 mmol/L (ref 3.5–5.1)
Sodium: 135 mmol/L (ref 135–145)
Total Bilirubin: 1.1 mg/dL (ref 0.3–1.2)
Total Protein: 6.9 g/dL (ref 6.5–8.1)

## 2021-07-26 LAB — GLUCOSE, CAPILLARY
Glucose-Capillary: 131 mg/dL — ABNORMAL HIGH (ref 70–99)
Glucose-Capillary: 183 mg/dL — ABNORMAL HIGH (ref 70–99)
Glucose-Capillary: 212 mg/dL — ABNORMAL HIGH (ref 70–99)
Glucose-Capillary: 133 mg/dL — ABNORMAL HIGH (ref 70–99)

## 2021-07-26 LAB — MAGNESIUM: Magnesium: 2.3 mg/dL (ref 1.7–2.4)

## 2021-07-26 LAB — BRAIN NATRIURETIC PEPTIDE: B Natriuretic Peptide: 10.2 pg/mL (ref 0.0–100.0)

## 2021-07-26 MED ORDER — FUROSEMIDE 10 MG/ML IJ SOLN
40.0000 mg | Freq: Once | INTRAMUSCULAR | Status: AC
Start: 2021-07-26 — End: 2021-07-26
  Administered 2021-07-26: 40 mg via INTRAVENOUS
  Filled 2021-07-26: qty 4

## 2021-07-26 MED ORDER — OXYCODONE HCL 5 MG PO TABS
5.0000 mg | ORAL_TABLET | Freq: Four times a day (QID) | ORAL | Status: DC | PRN
  Administered 2021-07-26 – 2021-07-27 (×3): 5 mg via ORAL
  Filled 2021-07-26 (×3): qty 1

## 2021-07-26 MED ORDER — NALOXONE HCL 0.4 MG/ML IJ SOLN: 0.4000 mg | INTRAMUSCULAR | Status: DC | PRN

## 2021-07-26 MED ORDER — LACTULOSE 10 GM/15ML PO SOLN
30.0000 g | Freq: Once | ORAL | Status: AC
  Administered 2021-07-26: 30 g via ORAL
  Filled 2021-07-26: qty 45

## 2021-07-26 MED ORDER — CALCIUM CARBONATE ANTACID 500 MG PO CHEW
1.0000 | CHEWABLE_TABLET | Freq: Three times a day (TID) | ORAL | Status: DC | PRN
  Administered 2021-07-29 – 2021-08-02 (×6): 200 mg via ORAL
  Filled 2021-07-26 (×6): qty 1

## 2021-07-26 MED ORDER — OXYCODONE HCL 5 MG PO TABS: 5.0000 mg | ORAL_TABLET | Freq: Three times a day (TID) | ORAL | Status: DC

## 2021-07-26 MED ORDER — DILTIAZEM HCL 25 MG/5ML IV SOLN
10.0000 mg | Freq: Four times a day (QID) | INTRAVENOUS | Status: DC | PRN
  Filled 2021-07-26: qty 5

## 2021-07-26 NOTE — Progress Notes (Signed)
Physical Therapy Treatment ?Patient Details ?Name: Gina Bradley ?MRN: [id number] ?DOB: 01-09-1961 ?Today's Date: 07/26/2021 ? ? ?History of Present Illness 61 yo female former smoker presented to [facility] ER on 3/21 with AMS, fever.  Found to have multifocal pneumonia and required intubation and pressors.  Transferred to [facility] 06/25/21 for treatment of acute on chronic hypoxic/hypercapnic respiratory failure from PNA, COPD with acute COPD, paroxysmal SVT, acute on chronic diastolic CHF, and acute metabolic encephalopathy in presence of peripheral neuropathy, back pain and chronic opiate use. PMH: CAD, smoker, heart failure, GERD, type 2 DM, hypertension, SVT, peripheral neuropathy, steroid dependent COPD, chronic pain history of DVT, chronic lymphedema, spinal stenosis, was recently diagnosed with compression deformity of the L4 vertebrae and subacute fracture of the right inferior pubic rami seen in the ED on 06/06/2021 after fall. ? ?  ?PT Comments  ? ? Patient up in recliner on arrival and requesting to return to bed. Required incr time for transfer due to multiple lines and pt easily distracted and required frequent re-direction to task. Will attempt progression to ambulation next visit.  ?   ?Recommendations for follow up therapy are one component of a multi-disciplinary discharge planning process, led by the attending physician.  Recommendations may be updated based on patient status, additional functional criteria and insurance authorization. ? ?Follow Up Recommendations ? Skilled nursing-short term rehab (<3 hours/day) ?  ?  ?Assistance Recommended at Discharge Frequent or constant Supervision/Assistance  ?Patient can return home with the following Two people to help with walking and/or transfers;A lot of help with bathing/dressing/bathroom;Assistance with cooking/housework;Assistance with feeding;Direct supervision/assist for medications management;Direct supervision/assist for financial management;Assist for  transportation;Help with stairs or ramp for entrance ?  ?Equipment Recommendations ? BSC/3in1;Wheelchair (measurements PT);Wheelchair cushion (measurements PT);Hospital bed;Other (comment)  ?  ?Recommendations for Other Services   ? ? ?  ?Precautions / Restrictions Precautions ?Precautions: Fall ?Precaution Comments: hx of fall with L4 compression fx ?Restrictions ?Weight Bearing Restrictions: No  ?  ? ?Mobility ? Bed Mobility ?Overal bed mobility: Needs Assistance ?Bed Mobility: Sit to Supine ?  ?  ?  ?Sit to supine: Min assist, HOB elevated ?  ?General bed mobility comments: min assist to direct torso toward sidelying and then supine; assist to lift 2nd leg onto bed ?  ? ?Transfers ?Overall transfer level: Needs assistance ?Equipment used: Rolling walker (2 wheels) ?Transfers: Sit to/from Stand ?Sit to Stand: Min assist ?  ?  ?  ?  ?  ?General transfer comment: Pt completed stand pivot with the RW for support and took 2-3 small steps to the bed with min assist (pt eager to return to bed) ?  ? ?Ambulation/Gait ?  ?  ?  ?  ?  ?  ?  ?General Gait Details: reports she is too tired to try to walk ? ? ?Stairs ?  ?  ?  ?  ?  ? ? ?Wheelchair Mobility ?  ? ?Modified Rankin (Stroke Patients Only) ?  ? ? ?  ?Balance Overall balance assessment: Needs assistance ?Sitting-balance support: Feet supported ?Sitting balance-Leahy Scale: Fair ?Sitting balance - Comments: at EOB did not require UE support to maintain ?  ?Standing balance support: Bilateral upper extremity supported, During functional activity ?Standing balance-Leahy Scale: Poor ?Standing balance comment: Pt needs UE support on the RW for completion of transfer. ?  ?  ?  ?  ?  ?  ?  ?  ?  ?  ?  ?  ? ?  ?Cognition Arousal/Alertness:  Awake/alert ?Behavior During Therapy: [facility] for tasks assessed/performed ?Overall Cognitive Status: Impaired/Different from baseline ?Area of Impairment: Orientation, Attention, Memory, Following commands, Awareness, Problem solving ?  ?  ?   ?  ?  ?  ?  ?  ?Orientation Level: Disoriented to, Place ?Current Attention Level: Focused ?Memory: Decreased short-term memory ?Following Commands: Follows one step commands with increased time ?Safety/Judgement: Decreased awareness of safety, Decreased awareness of deficits ?Awareness: Intellectual ?Problem Solving: Requires tactile cues, Requires verbal cues, Decreased initiation, Slow processing ?General Comments: Pt needs max cueing for sustained attention and for initiation of transfers ?  ?  ? ?  ?Exercises General Exercises - Lower Extremity ?Ankle Circles/Pumps: AROM, Both, 10 reps ? ?  ?General Comments   ?  ?  ? ?Pertinent Vitals/Pain Pain Assessment ?Pain Assessment: Faces ?Faces Pain Scale: No hurt  ? ? ?Home Living   ?  ?  ?  ?  ?  ?  ?  ?  ?  ?   ?  ?Prior Function    ?  ?  ?   ? ?PT Goals (current goals can now be found in the care plan section) Acute Rehab PT Goals ?Patient Stated Goal: wants to stand ?Time For Goal Achievement: 08/05/21 ?Potential to Achieve Goals: Fair ?Progress towards PT goals: Progressing toward goals ? ?  ?Frequency ? ? ? Min 3X/week ? ? ? ?  ?PT Plan Current plan remains appropriate  ? ? ?Co-evaluation   ?  ?  ?  ?  ? ?  ?AM-PAC PT "6 Clicks" Mobility   ?Outcome Measure ? Help needed turning from your back to your side while in a flat bed without using bedrails?: A Little ?Help needed moving from lying on your back to sitting on the side of a flat bed without using bedrails?: A Little ?Help needed moving to and from a bed to a chair (including a wheelchair)?: A Little ?Help needed standing up from a chair using your arms (e.g., wheelchair or bedside chair)?: A Little ?Help needed to walk in hospital room?: Total ?Help needed climbing 3-5 steps with a railing? : Total ?6 Click Score: 14 ? ?  ?End of Session Equipment Utilized During Treatment: Oxygen (28% trach collar) ?Activity Tolerance: Patient limited by fatigue ?Patient left: with call bell/phone within reach;in bed;with  bed alarm set ?Nurse Communication: Mobility status ?PT Visit Diagnosis: Unsteadiness on feet (R26.81);Muscle weakness (generalized) (M62.81);Other abnormalities of gait and mobility (R26.89);History of falling (Z91.81);Difficulty in walking, not elsewhere classified (R26.2) ?Pain - Right/Left: Left ?Pain - part of body: Hip ?  ? ? ?Time: [id number] ?PT Time Calculation (min) (ACUTE ONLY): 33 min ? ?Charges:  $Therapeutic Activity: 23-37 mins          ?          ? ? ?Arby Barrette, PT ?Acute Rehabilitation Services  ?Pager [phone] ?Office [phone] ? ? ? ?Jeanie Cooks Zymire Turnbo ?07/26/2021, 2:07 PM ? ?

## 2021-07-26 NOTE — Progress Notes (Signed)
Occupational Therapy Treatment ?Patient Details ?Name: Gina Bradley ?MRN: [id number] ?DOB: [birth year] ?Today's Date: 07/26/2021 ? ? ?History of present illness 61 yo female former smoker presented to [facility] ER on 3/21 with AMS, fever.  Found to have multifocal pneumonia and required intubation and pressors.  Transferred to [facility] 06/25/21 for treatment of acute on chronic hypoxic/hypercapnic respiratory failure from PNA, COPD with acute COPD, paroxysmal SVT, acute on chronic diastolic CHF, and acute metabolic encephalopathy in presence of peripheral neuropathy, back pain and chronic opiate use. PMH: CAD, smoker, heart failure, GERD, type 2 DM, hypertension, SVT, peripheral neuropathy, steroid dependent COPD, chronic pain history of DVT, chronic lymphedema, spinal stenosis, was recently diagnosed with compression deformity of the L4 vertebrae and subacute fracture of the right inferior pubic rami seen in the ED on 06/06/2021 after fall. ?  ?OT comments ? Pt still with significant attention and awareness issues pertaining to selfcare tasks.  Overall, mod assist for stand pivot transfer to the recliner with setup assist for washing her face.  Still with some nausea as well.  HR remained at 110 BPM at rest, increasing up to 130 with activity.  Oxygen sats 92-94% on 5 Ls trach collar at 28% throughout session.  Will continue with acute care OT to progress toward established min assist level goals with anticipation of transfer to SNF.    ? ?Recommendations for follow up therapy are one component of a multi-disciplinary discharge planning process, led by the attending physician.  Recommendations may be updated based on patient status, additional functional criteria and insurance authorization. ?   ?Follow Up Recommendations ? Skilled nursing-short term rehab (<3 hours/day)  ?  ?Assistance Recommended at Discharge Frequent or constant Supervision/Assistance  ?Patient can return home with the following ? A lot of help with walking  and/or transfers;A lot of help with bathing/dressing/bathroom;Direct supervision/assist for medications management;Help with stairs or ramp for entrance;Assistance with cooking/housework;Assist for transportation;Direct supervision/assist for financial management ?  ?Equipment Recommendations ? Other (comment) (TBD next venue of care)  ?  ?   ?Precautions / Restrictions Precautions ?Precautions: Fall ?Precaution Comments: hx of fall with L4 compression fx ?Restrictions ?Weight Bearing Restrictions: No  ? ? ?  ? ?Mobility Bed Mobility ?Overal bed mobility: Needs Assistance ?Bed Mobility: Supine to Sit ?  ?  ?Supine to sit: Supervision ?  ?  ?  ?  ? ?Transfers ?Overall transfer level: Needs assistance ?Equipment used: Rolling walker (2 wheels) ?Transfers: Sit to/from Stand ?Sit to Stand: Mod assist ?  ?  ?  ?  ?  ?General transfer comment: Pt completed stand pivot with the RW for support and took 2-3 small steps to the bedside recliner at mod assist level. ?  ?  ?Balance Overall balance assessment: Needs assistance ?Sitting-balance support: Feet supported ?Sitting balance-Leahy Scale: Fair ?  ?  ?Standing balance support: Bilateral upper extremity supported, During functional activity ?Standing balance-Leahy Scale: Poor ?Standing balance comment: Pt needs UE support on the RW for completion of transfer. ?  ?  ?  ?  ?  ?  ?  ?  ?  ?  ?  ?   ? ?ADL either performed or assessed with clinical judgement  ? ?ADL Overall ADL's : Needs assistance/impaired ?  ?  ?  ?  ?Upper Body Bathing: Supervision/ safety;Sitting ?Upper Body Bathing Details (indicate cue type and reason): washing face sitting supported in bedside chair ?  ?  ?  ?  ?  ?  ?  ?  ?  ?  ?  ?  ?  Functional mobility during ADLs: Moderate assistance;Rolling walker (2 wheels) (to take 5 steps) ?General ADL Comments: Pt completed supine to sit with supervision and increased time secondary to decreased sustained attention.  She then completed stand pivot transfer to  the bedside recliner at [facility] assist using the RW for support.  Max demonstrational cueing for hand placement to complete sit to stand as she wanted to pull up on the RW instead of pushing up off of the surface.  HR at 110 BPM at rest increasing up to 131 with activity.  Oxygen sats remained at 92-94% on 5Ls trach collar at 28%. ?  ? ? ?   ?   ?   ? ?Cognition Arousal/Alertness: Awake/alert ?Behavior During Therapy: [facility] for tasks assessed/performed ?Overall Cognitive Status: Impaired/Different from baseline ?Area of Impairment: Orientation, Attention, Memory, Following commands, Awareness, Problem solving ?  ?  ?  ?  ?  ?  ?  ?  ?Orientation Level: Disoriented to, Place (She stated [place] or [place]) ?Current Attention Level: Focused ?Memory: Decreased short-term memory ?Following Commands: Follows one step commands with increased time ?Safety/Judgement: Decreased awareness of safety, Decreased awareness of deficits ?Awareness: Intellectual ?Problem Solving: Requires tactile cues, Requires verbal cues, Decreased initiation ?General Comments: Pt needs max cueing for sustained attention and for initiation of transfers and selfcare tasks. ?  ?  ?   ?   ?   ?   ? ? ?Pertinent Vitals/ Pain       Pain Assessment ?Pain Assessment: Faces ?Faces Pain Scale: No hurt ? ? ?Frequency ? Min 2X/week  ? ? ? ? ?  ?Progress Toward Goals ? ?OT Goals(current goals can now be found in the care plan section) ? Progress towards OT goals: Progressing toward goals ? ?Acute Rehab OT Goals ?Patient Stated Goal: Pt wants to go home soon. ?Time For Goal Achievement: 08/06/21 ?Potential to Achieve Goals: Good  ?Plan Discharge plan remains appropriate;Frequency remains appropriate   ? ?   ?AM-PAC OT "6 Clicks" Daily Activity     ?Outcome Measure ? ? Help from another person eating meals?: A Little ?Help from another person taking care of personal grooming?: A Little ?Help from another person toileting, which includes using toliet, bedpan,  or urinal?: A Lot ?Help from another person bathing (including washing, rinsing, drying)?: A Lot ?Help from another person to put on and taking off regular upper body clothing?: A Little ?Help from another person to put on and taking off regular lower body clothing?: A Lot ?6 Click Score: 15 ? ?  ?End of Session Equipment Utilized During Treatment: Oxygen ? ?OT Visit Diagnosis: Other abnormalities of gait and mobility (R26.89);Muscle weakness (generalized) (M62.81);Other symptoms and signs involving cognitive function ?  ?Activity Tolerance Patient tolerated treatment well ?  ?Patient Left in chair;with call bell/phone within reach;with chair alarm set ?  ?Nurse Communication Mobility status (request for nausea medicine) ?  ? ?   ? ?Time: [id number] ?OT Time Calculation (min): 35 min ? ?Charges: OT Treatments ?$Self Care/Home Management : 23-37 mins ? ?Roshun Klingensmith OTR/L ?07/26/2021, 12:41 PM ?

## 2021-07-26 NOTE — Progress Notes (Signed)
? ?NAME:  Gina Bradley, MRN:  [id number], DOB:  1960-04-07, LOS: 34 ?ADMISSION DATE:  06/22/2021, CONSULTATION DATE:  3/21 ?REFERRING MD:  Laural Benes, [facility], CHIEF COMPLAINT:  acute resp failure/ fever wth AMS and SVT ? ?History of Present Illness:  ?61 y/o female former smoker presented to [facility] ER on 3/21 with AMS, fever.  Found to have multifocal pneumonia and required intubation and pressors.  Transferred to [facility] for further management. ? ?Pertinent Medical History:  ?Anemia, Back pain, Spinal stenosis COPD, DM type 2, HTN, GERD, CHF, Lymphedema, Neuropathy, SVT ? ?Significant Hospital Events: ?Including procedures, antibiotic start and stop dates in addition to other pertinent events   ?3/21 Admit to [facility], intubated, start pressors, cardiology consult for SVT, start amiodarone ?3/24 transfer to [facility] ?3/28 Trach for failure to wean ?3/31 Fever, hypotension, worsening leukocytosis. Started on levophed and antibiotics. ?4/2 PICC removed for line holiday; ID following ?4/4 off precedex;and fentanyl; ID plan to stop cefepime and vanc ?4/5 remains off precedex and fentanyl; failed PSV early in morning ?4/6 remains off continuous sedation; failed PSV this morning due to desaturation ?4/9 Tolerated being off the vent for about 8 hours yesterday 4/8 ?4/10 WBC elevated, copious thick yellow secretions, restless / agitated  ?4/17 #8 cuffed trach, 28% ATC. Planned trach change to #6 cuffless ?4/24 Tolerating ATC, PMV. ? ?Interim History / Subjective:  ?No significant events overnight ?Talkative, in good spirits ?Tolerating PMV well ?Remains on trach collar ?Sipping clears ? ?Objective:  ?Blood pressure 99/71, pulse 100, temperature 98.5 ?F (36.9 ?C), temperature source Oral, resp. rate 17, height 5\' 5"  (1.651 m), weight 95.8 kg, last menstrual period 01/05/2015, SpO2 98 %. ?   ?FiO2 (%):  [28 %] 28 %  ? ?Intake/Output Summary (Last 24 hours) at 07/26/2021 1136 ?Last data filed at 07/26/2021 [phone] ?Gross per 24 hour  ?Intake 68.34 ml   ?Output 1750 ml  ?Net -1681.66 ml  ? ? ?Filed Weights  ? 07/21/21 0417 07/22/21 0500 07/24/21 0421  ?Weight: 95.3 kg 96.6 kg 95.8 kg  ? ?Physical Examination: ?General: Chronically ill-appearing middle-aged woman in NAD. Pleasant and conversant. ?HEENT: [place]/AT, anicteric sclera, PERRL, moist mucous membranes. Trach in place #6 cuffless, PMV in place. ?Neuro: Awake, oriented x 3, some confusion as to why she was admitted. Responds to verbal stimuli. Following commands consistently. Moves all 4 extremities spontaneously. ?CV: Mild tachycardia, regular rhythm, no m/g/r. ?PULM: Breathing even and unlabored on ATC, PMV in place. Lung fields coarse in upper fields. ?GI: Obese, soft, nontender, nondistended. Normoactive bowel sounds. ?Extremities: Bilateral symmetric 1+ LE edema noted. ?Skin: Warm/dry, bilateral LE skin discoloration. ? ?Resolved Problems:  ?Septic shock, Elevated troponin from demand ischemia ?Urinary retention ? ?Assessment & Plan:  ? ?Acute on Chronic Hypoxic / Hypercapnic Respiratory Failure secondary to Multifocal Aspiration PNA, Metapneumovirus, AECOPD, and Metabolic Encephalopathy s/p Tracheostomy  ?Trach placed 3/28 for failure to vent wean.  Downsized to #6 [place] cuffless 4/17. ?- Tolerating ATC without issue ?- Continue supplemental O2 support via ATC ?- Wean FiO2 for sat > 90% ?- Continue Pulmicort, Brovana/Yupelri, Albuterol PRN ?- Continue prednisone 10mg  daily ?- Pulmonary hygiene including chest PT, upright positioning, mobilization as able ?- Diuresis as tolerated ?- Intermittent CXR ?- SLP eval/treat with increased PMV use as able ? ?PCCM will see weekly for trach care / progression.  All other medical issues per [facility].  Please call sooner if new needs arise.  ? ?Best Practice (right click and "Reselect all SmartList Selections" daily)  ?Diet/type:  tubefeeds ?DVT prophylaxis: LMWH ?GI prophylaxis: PPI ?Lines: N/A ?Foley:  N/A ?Code Status:  full code ?Last date of multidisciplinary goals of  care discussion. Per [facility]  ? ?Signature:  ? ?Tim Lair, PA-C ?[facility] ?07/26/21 12:13 PM ? ?Please see [email or web address] for pager details. ? ?From 7A-7P if no response, please call [phone] ?After hours, please call [place] [phone] ? ? ?

## 2021-07-26 NOTE — Progress Notes (Signed)
? ?                                                                                                                                                     ?                                                   ?Daily Progress Note  ? ?Patient Name: Gina Bradley       Date: 07/26/2021 ?DOB: 17-May-1960  Age: 61 y.o. MRN#: [id number] ?Attending Physician: Thurnell Lose, MD ?Primary Care Physician: Redmond School, MD ?Admit Date: 06/22/2021 ? ?Reason for Consultation/Follow-up: Establishing goals of care ? ?Patient Profile/HPI:  61 y.o. female  with past medical history of DM-2, HTN, SVT, HFpEF, steroid-dependent COPD, opiate dependence, and recent compression fracture at L4/right inferior pubic rami fracture admitted on 06/22/2021 with AMS, shortness of breath, and SVT. She was diagnosed with septic shock due to pneumonia and required intubation.  Patient was unable to wean and required trach on March 28.  Patient now on trach collar.  Patient is not safe for p.o. intake and so continues to have core track in place.  Patient severely encephalopathic.  Medical team has had difficulty getting in touch with patient's family.  PMT consulted to discuss goals of care. ? ?Subjective: ?Chart reviewed including labs, progress notes. She's on trach collar with humidified air now. She has been able to stand with help from PT, but not yet ambulating. ?Today she is very lethargic. She arouses on command but quickly falls back asleep. She is complaining of burning chest pain- attending is aware- following serial troponins.  ?She is unable to participate in [place] discussion today due to lethargy.  ?Called spouse [name]- no answer, phone disconnects.  ?I was able to speak with listed contact Ciara. Per Jacqulynn Cadet will not ever answer a phone call. I attempted to relay the importance of needing to be able to be in contact with [name] if he desires to be surrogate decision maker for patient.  ?Alanson Puls is to contact Tony's brother and give him PMT  phone number.  ? ?Review of Systems  ?Constitutional:  Positive for malaise/fatigue.  ?Cardiovascular:  Positive for chest pain.  ?Neurological:  Positive for weakness.  ? ? ?Physical Exam ?Vitals and nursing note reviewed.  ?Pulmonary:  ?   Effort: Pulmonary effort is normal.  ?   Breath sounds: Rhonchi present.  ?Abdominal:  ?   General: Bowel sounds are normal.  ?   Palpations: Abdomen is soft.  ?Neurological:  ?   Mental Status: She is alert. She is disoriented.  ?   Comments: lethargic  ?         ? ?  Vital Signs: BP 105/82 (BP Location: Left Arm)   Pulse (!) 117   Temp 98 ?F (36.7 ?C) (Oral)   Resp 17   Ht 5\' 5"  (1.651 m)   Wt 95.8 kg   LMP 01/05/2015   SpO2 94%   BMI 35.15 kg/m?  ?SpO2: SpO2: 94 % ?O2 Device: O2 Device: Tracheostomy Collar ?O2 Flow Rate: O2 Flow Rate (L/min): 5 L/min ? ?Intake/output summary:  ?Intake/Output Summary (Last 24 hours) at 07/26/2021 1237 ?Last data filed at 07/26/2021 1232 ?Gross per 24 hour  ?Intake 68.34 ml  ?Output 2600 ml  ?Net -2531.66 ml  ? ? ?LBM: Last BM Date : 07/22/21 ?Baseline Weight: Weight: 100 kg ?Most recent weight: Weight: 95.8 kg ? ?     ?Palliative Assessment/Data: PPS: 30% ? ? ? ?Flowsheet Rows   ? ?Flowsheet Row Most Recent Value  ?Intake Tab   ?Referral Department Hospitalist  ?Unit at Time of Referral Intermediate Care Unit  ?Palliative Care Primary Diagnosis Pulmonary  ?Date Notified 07/17/21  ?Palliative Care Type New Palliative care  ?Reason for referral Clarify Goals of Care  ?Date of Admission 06/22/21  ?Date first seen by Palliative Care 07/17/21  ?# of days Palliative referral response time 0 Day(s)  ?# of days IP prior to Palliative referral 25  ?Clinical Assessment   ?Psychosocial & Spiritual Assessment   ?Palliative Care Outcomes   ? ?  ? ? ?Patient Active Problem List  ? Diagnosis Date Noted  ?? Sepsis ([place])   ?? Acute metabolic encephalopathy 99991111  ?? Opioid dependence ([place]) 06/23/2021  ?? Multifocal pneumonia 06/23/2021  ?? Obesity,  Class III, BMI 40-49.9 (morbid obesity) ([place]) 06/23/2021  ?? Transaminasemia 06/23/2021  ?? Acute respiratory failure with hypoxia and hypercarbia ([place]) 06/22/2021  ?? Severe sepsis ([place]) 06/22/2021  ?? Right subacute Inferior pubic ramus fracture 06/22/2021  ?? Endometrial thickening on CT scan 06/22/2021  ?? Chronic heart failure with preserved ejection fraction (HFpEF) ([place]) 06/22/2021  ?? Uncontrolled type 2 diabetes mellitus with hyperglycemia, without long-term current use of insulin ([place]) 08/18/2020  ?? Tick bite of left lower leg   ?? Peripheral edema 08/17/2020  ?? Hypokalemia   ?? Class 1 obesity due to excess calories with body mass index (BMI) of 33.0 to 33.9 in adult   ?? Cellulitis   ?? Acidosis, metabolic [id number]  ?? PMB (postmenopausal bleeding) 10/06/2016  ?? Chest pressure 01/05/2015  ?? Gastric erosion   ?? Encounter for screening colonoscopy 01/22/2014  ?? Constipation 01/20/2014  ?? GERD (gastroesophageal reflux disease) 01/20/2014  ?? Dyspepsia 01/20/2014  ?? PSVT (paroxysmal supraventricular tachycardia) ([place]) 05/31/2013  ?? HTN (hypertension), benign 05/31/2013  ?? Hypercholesteremia 05/31/2013  ? ? ?Palliative Care Assessment & Plan  ? ? ?Assessment/Recommendations/Plan ? ?Continue full scope full code ?GOC are to d/c to SNF and eventually home ?PMT will follow up re: advanced care planning and surrogate decision maker choices- patient's spouse has been repeatedly unreachable and is proving to be an unreliable surrogate- even though he is surrogate decision maker in hierarchy due to being spouse- the fact that he is unreachable means that another surrogate needs to be determined- attempted to clarify with Levada Dy yesterday, but she could not designate who she would want- will continue to work to establish adequate decision maker- have requested TOC make report to [facility]PS  ? ? ?Code Status: ?Full code ? ?Prognosis: ? Unable to determine ? ?Discharge Planning: ?To Be Determined ? ?Care plan was  discussed with patient ? ?Thank you for  allowing the Palliative Medicine Team to assist in the care of this patient. ? ?Total time: 60 minutes ? ?Mariana Kaufman, AGNP-C ?Palliative Medicine ? ? ?Please contact Palliative Medicine Team phone at [phone] for questions and concerns.  ? ? ? ? ? ? ?

## 2021-07-26 NOTE — Progress Notes (Signed)
?      ?                 PROGRESS NOTE ? ?      ?PATIENT DETAILS ?Name: Gina Bradley ?Age: 61 y.o. ?Sex: female ?Date of Birth: Jul 17, 1960 ?Admit Date: 06/22/2021 ?Admitting Physician Rolla Plate, DO ?[id number], Purcell Nails, MD ? ?Brief Summary: ?Patient is a 61 y.o.  female with history of DM-2, HTN, SVT, HFpEF, steroid-dependent COPD, opiate dependence, recent compression fracture at L4/right inferior pubic rami fracture-who presented to [facility] ED with altered mental status, SOB with hypoxia, SVT-she was thought to have septic shock due to PNA-she was subsequently intubated-started on broad-spectrum antibiotics/pressors and managed in the ICU.  She was subsequently transferred to [facility] ICU-she was stabilized-however she had failure to wean and underwent a tracheostomy on 3/28.  While in the ICU-she developed fever requiring ID consultation-ICU delirium/encephalopathy requiring on and off infusion of Precedex.  After stability-she was transferred to [facility] on 4/11-where further hospital course was complicated by persistent encephalopathy and MSSA PNA (sputum cultures on 4/10).  She was managed with supportive care-with significant clearing of her encephalopathy.  See below for further details.   ? ?Significant events: ?3/21>> Admit to [facility], intubated, start pressors, cardiology consult for SVT, start amiodarone ?3/24>> transfer to [facility] ?3/28>> Trach for failure to wean ?4/11>> transfer to [facility] ?4/17>> trach downsized to #6 cuffless ?4/18>> SLP reeval and MBS completed-dysphagia 1 diet started. ?4/19>> eating around 40% of some of her meals-at times able to feed herself. ?4/20>> Cortak tube removed ?4/21>> completely awake/alert ? ?Significant studies: ?3/21>> CT head: No acute abnormalities. ?3/21>> CTA chest: No PE, multifocal pneumonia.  Multiple subacute/chronic rib fractures. ?3/21>> CT abdomen/pelvis: No acute finding-thickened endometrium. ?3/23>> RUQ ultrasound: No gallstones ?3/25>> Echo: EF 65-70% ?3/31>> MRI  brain: No acute intracranial abnormality. ?4/01>> CT chest: Extensive multifocal patchy airspace consolidations. ?4/12>> EEG: No seizures. ? ?Significant microbiology data: ?3/21>> COVID/influenza PCR: Negative ?3/21>> blood culture: No growth ?3/21>> urine culture: No growth ?3/22>> tracheal aspirate: No growth ?3/23>> urine culture: 1000 colonies/mL of Staph aureus ?3/23>> blood culture: Staph hominis (contamination) ?3/27>> tracheal aspirate: No growth ?3/31>> blood culture 1/2: Staph epidermidis (contamination) ?4/01>> tracheal aspirate: No growth ?4/03>> blood culture: No growth ?4/05>> respiratory virus panel: Meta pneumonia virus ?4/10>> tracheal aspirate: MSSA ? ?Antimicrobial therapy: ?Doxycyline 3/22 >> 3/24 ?Ceftriaxone 3/21 >>3/25 ?Azithromycin 3/21 x1 dose ?Vancomycin 3/24 >>3/25; 4/1>> 4/4, 4/10 >>4/13 ?Zosyn 3/25 >> 3/28 ?Cefepime 3/31>> 4/4 ?Diflucan 4/1>> 4/4 ?Cefazolin 4/13>> 4/19. ? ?Procedures: ?3/28>> tracheostomy ? ?Consults: ?ID, PCCM, cardiology ? ?Subjective:  Patient in bed, appears comfortable, denies any headache, no fever, no chest pain or pressure, no shortness of breath , no abdominal pain. No new focal weakness. ? ? ? ?Objective: ?Vitals: ?Blood pressure 99/71, pulse 100, temperature 98.5 ?F (36.9 ?C), temperature source Oral, resp. rate 17, height 5\' 5"  (1.651 m), weight 95.8 kg, last menstrual period 01/05/2015, SpO2 98 %.  ? ?Exam: ? ?Awake Alert, No new F.N deficits, Normal affect ?[place].AT,PERRAL ?Supple Neck, Trach site clear with PMV & TC  ?Symmetrical Chest wall movement, Good air movement bilaterally, CTAB ?RRR,No Gallops, Rubs or new Murmurs,  ?+ve B.Sounds, Abd Soft, No tenderness,   ?No Cyanosis, Clubbing or edema  ? ? ? ?  ? ?Assessment/Plan: ? ?Septic shock, acute on chronic hypoxic and hypercapnic respiratory failure due to multifocal aspiration pneumonia requiring tracheostomy in ICU : Sepsis physiology has resolved-clinically improved-leukocytosis still persists but has  now started to  downtrend.  Chest pathophysiology has now resolved, she has completed her antibiotic treatment.  She also had viral infection with metapneumovirus.  She is currently on #6 cuffless trach with trach collar.  Clinically improving.  Continue to monitor.  PCCM managing trach site. ? ?Acute metabolic encephalopathy: Likely multifactorial etiology-due to PNA/ICU delirium/sepsis/polypharmacy.  Deficits much improved with supportive.  ? ?Dysphagia: Due to severe encephalopathy/recent trach-had a cortak tube in place-but with significant improvement in encephalopathy-cortak tubewas discontinued on 4/20.  Per nursing staff-significant improvement in her appetite.  Speech following on oral diet. ? ?Asymptomatic transaminitis: Unclear significance-could have been from sepsis-improved-RUQ ultrasound was without acute abnormalities. ? ?COPD: Continue bronchodilators-on chronic prednisone. ? ?SVT: unsuccessful cardioversion on initial presentation to the ED-subsequently required amiodarone infusion-currently maintaining sinus rhythm-on PO Lopressor. ? ?Acute on chronic HFpEF,  EF preserved at 65%: Volume status stable-continue Aldactone - PRN IV Lasix. ? ?Bilateral extremity lymphedema: Continues to have significant lower extremity edema- K rising - DC Aldactone -has required several doses of as needed IV Lasix. ? ?HTN: BP stable-continue Lopressor. ? ?HLD: Continue statin ?  ?GERD: Continue PPI ? ?Normocytic anemia: Due to acute illness-no evidence of blood loss-transfuse if Hb <7 ? ?Endometrial thickening on CT abdomen: Will require outpatient follow-up with GYN. ? ?Opiate dependence: Continue oxycodone-dosage has been gradually decreased to 10 mg every 8 hours.  She is on chronic narcotics at home.  ? ?Recent right pubic rami fracture: Supportive care at this point. ? ?Hypomagnesemia.  Replaced.   ? ?Constipation.  Resolved after bowel regimen continue. ? ?Palliative care: She wishes to be full code decisions to  be made by husband according to the patient. ? ?DM-2 (A1c 7.5 on 3/22): CBGs relatively stable-continue SSI ? ?CBG (last 3)  ?Recent Labs  ?  07/25/21 ?1559 07/25/21 ?2043 07/26/21 ?0842  ?GLUCAP 143* 133* 131*  ? ? ? ?Nutrition Status: ?Nutrition Problem: Inadequate oral intake ?Etiology: inability to eat ?Signs/Symptoms: NPO status ?Interventions: Tube feeding, Prostat ? ?Obesity ?Estimated body mass index is 35.15 kg/m? as calculated from the following: ?  Height as of this encounter: 5\' 5"  (1.651 m). ?  Weight as of this encounter: 95.8 kg.  ? ?Code status: ?  Code Status: Full Code  ? ?DVT Prophylaxis: Prophylactic Lovenox ? ?Family Communication: Spouse-[phone] called-no response on 4/21. ? ?Disposition Plan: ?Status is: Inpatient ?Remains inpatient appropriate because: Resolving septic shock-encephalopathy-plan is now for SNF-but per social work-can only be placed in SNF 1 month after trach placed.   ?  ?Planned Discharge Destination: SNF. ? ? ?Diet: ?Diet Order   ? ?       ?  Diet clear liquid Room service appropriate? Yes; Fluid consistency: Thin  Diet effective now       ?  ? ?  ?  ? ?  ?  ? ? ?Antimicrobial agents: ?Anti-infectives (From admission, onward)  ? ? Start     Dose/Rate Route Frequency Ordered Stop  ? 07/15/21 1230  ceFAZolin (ANCEF) IVPB 2g/100 mL premix       ? 2 g ?200 mL/hr over 30 Minutes Intravenous Every 8 hours 07/15/21 1142 07/22/21 0656  ? 07/14/21 1200  vancomycin (VANCOREADY) IVPB 1500 mg/300 mL  Status:  Discontinued       ? 1,500 mg ?150 mL/hr over 120 Minutes Intravenous Every 24 hours 07/13/21 0845 07/15/21 1139  ? 07/13/21 0915  vancomycin (VANCOREADY) IVPB 2000 mg/400 mL       ? 2,000 mg ?200 mL/hr over 120 Minutes  Intravenous  Once 07/13/21 0829 07/13/21 1053  ? 07/05/21 1015  fluconazole (DIFLUCAN) tablet 200 mg  Status:  Discontinued       ? 200 mg Oral Daily 07/05/21 0916 07/05/21 0917  ? 07/05/21 1015  fluconazole (DIFLUCAN) tablet 200 mg  Status:  Discontinued        ? 200 mg Per Tube Daily 07/05/21 0917 07/06/21 0924  ? 07/04/21 1800  vancomycin (VANCOREADY) IVPB 1500 mg/300 mL  Status:  Discontinued       ? 1,500 mg ?150 mL/hr over 120 Minutes Intravenous

## 2021-07-27 ENCOUNTER — Inpatient Hospital Stay (HOSPITAL_COMMUNITY): Payer: 59

## 2021-07-27 DIAGNOSIS — J9602 Acute respiratory failure with hypercapnia: Secondary | ICD-10-CM | POA: Diagnosis not present

## 2021-07-27 DIAGNOSIS — J9601 Acute respiratory failure with hypoxia: Secondary | ICD-10-CM | POA: Diagnosis not present

## 2021-07-27 DIAGNOSIS — Z7189 Other specified counseling: Secondary | ICD-10-CM | POA: Diagnosis not present

## 2021-07-27 DIAGNOSIS — G9341 Metabolic encephalopathy: Secondary | ICD-10-CM | POA: Diagnosis not present

## 2021-07-27 DIAGNOSIS — I5032 Chronic diastolic (congestive) heart failure: Secondary | ICD-10-CM | POA: Diagnosis not present

## 2021-07-27 LAB — CBC WITH DIFFERENTIAL/PLATELET
Abs Immature Granulocytes: 0.27 K/uL — ABNORMAL HIGH (ref 0.00–0.07)
Basophils Absolute: 0.1 K/uL (ref 0.0–0.1)
Basophils Relative: 1 %
Eosinophils Absolute: 0.3 K/uL (ref 0.0–0.5)
Eosinophils Relative: 3 %
HCT: 32.9 % — ABNORMAL LOW (ref 36.0–46.0)
Hemoglobin: 10.2 g/dL — ABNORMAL LOW (ref 12.0–15.0)
Immature Granulocytes: 3 %
Lymphocytes Relative: 29 %
Lymphs Abs: 3 K/uL (ref 0.7–4.0)
MCH: 30.5 pg (ref 26.0–34.0)
MCHC: 31 g/dL (ref 30.0–36.0)
MCV: 98.5 fL (ref 80.0–100.0)
Monocytes Absolute: 1.2 K/uL — ABNORMAL HIGH (ref 0.1–1.0)
Monocytes Relative: 11 %
Neutro Abs: 5.5 K/uL (ref 1.7–7.7)
Neutrophils Relative %: 53 %
Platelets: 388 K/uL (ref 150–400)
RBC: 3.34 MIL/uL — ABNORMAL LOW (ref 3.87–5.11)
RDW: 18.2 % — ABNORMAL HIGH (ref 11.5–15.5)
WBC: 10.3 K/uL (ref 4.0–10.5)
nRBC: 0 % (ref 0.0–0.2)

## 2021-07-27 LAB — GLUCOSE, CAPILLARY
Glucose-Capillary: 148 mg/dL — ABNORMAL HIGH (ref 70–99)
Glucose-Capillary: 190 mg/dL — ABNORMAL HIGH (ref 70–99)
Glucose-Capillary: 193 mg/dL — ABNORMAL HIGH (ref 70–99)
Glucose-Capillary: 169 mg/dL — ABNORMAL HIGH (ref 70–99)

## 2021-07-27 LAB — COMPREHENSIVE METABOLIC PANEL WITH GFR
ALT: 30 U/L (ref 0–44)
AST: 53 U/L — ABNORMAL HIGH (ref 15–41)
Albumin: 3 g/dL — ABNORMAL LOW (ref 3.5–5.0)
Alkaline Phosphatase: 93 U/L (ref 38–126)
Anion gap: 10 (ref 5–15)
BUN: 7 mg/dL (ref 6–20)
CO2: 26 mmol/L (ref 22–32)
Calcium: 9.2 mg/dL (ref 8.9–10.3)
Chloride: 95 mmol/L — ABNORMAL LOW (ref 98–111)
Creatinine, Ser: 0.73 mg/dL (ref 0.44–1.00)
GFR, Estimated: >60 mL/min
Glucose, Bld: 137 mg/dL — ABNORMAL HIGH (ref 70–99)
Potassium: 3.5 mmol/L (ref 3.5–5.1)
Sodium: 131 mmol/L — ABNORMAL LOW (ref 135–145)
Total Bilirubin: 0.8 mg/dL (ref 0.3–1.2)
Total Protein: 6.5 g/dL (ref 6.5–8.1)

## 2021-07-27 LAB — OSMOLALITY: Osmolality: 285 mosm/kg (ref 275–295)

## 2021-07-27 LAB — OSMOLALITY, URINE: Osmolality, Ur: 474 mOsm/kg (ref 300–900)

## 2021-07-27 LAB — MAGNESIUM: Magnesium: 1.6 mg/dL — ABNORMAL LOW (ref 1.7–2.4)

## 2021-07-27 LAB — PHOSPHORUS: Phosphorus: 4.7 mg/dL — ABNORMAL HIGH (ref 2.5–4.6)

## 2021-07-27 LAB — BRAIN NATRIURETIC PEPTIDE: B Natriuretic Peptide: 19.9 pg/mL (ref 0.0–100.0)

## 2021-07-27 LAB — URIC ACID: Uric Acid, Serum: 6.8 mg/dL (ref 2.5–7.1)

## 2021-07-27 MED ORDER — ADULT MULTIVITAMIN W/MINERALS CH
1.0000 | ORAL_TABLET | Freq: Every day | ORAL | Status: DC
  Administered 2021-07-27 – 2021-08-04 (×9): 1 via ORAL
  Filled 2021-07-27 (×9): qty 1

## 2021-07-27 MED ORDER — DULOXETINE HCL 20 MG PO CPEP
20.0000 mg | ORAL_CAPSULE | Freq: Every day | ORAL | Status: DC
  Administered 2021-07-27 – 2021-08-04 (×9): 20 mg via ORAL
  Filled 2021-07-27 (×11): qty 1

## 2021-07-27 MED ORDER — OXYCODONE HCL 5 MG PO TABS
10.0000 mg | ORAL_TABLET | Freq: Four times a day (QID) | ORAL | Status: DC | PRN
  Administered 2021-07-27 – 2021-08-04 (×26): 10 mg via ORAL
  Filled 2021-07-27 (×27): qty 2

## 2021-07-27 MED ORDER — ENSURE ENLIVE PO LIQD
237.0000 mL | Freq: Two times a day (BID) | ORAL | Status: DC
  Administered 2021-07-27 – 2021-08-04 (×17): 237 mL via ORAL

## 2021-07-27 NOTE — Progress Notes (Addendum)
? ?                                                                                                                                                     ?                                                   ?Daily Progress Note  ? ?Patient Name: Gina Bradley       Date: 07/27/2021 ?DOB: 10-22-1960  Age: 61 y.o. MRN#: [id number] ?Attending Physician: Leroy Sea, MD ?Primary Care Physician: Elfredia Nevins, MD ?Admit Date: 06/22/2021 ? ?Reason for Consultation/Follow-up: Establishing goals of care ? ?Patient Profile/HPI:  61 y.o. female  with past medical history of DM-2, HTN, SVT, HFpEF, steroid-dependent COPD, opiate dependence, and recent compression fracture at L4/right inferior pubic rami fracture admitted on 06/22/2021 with AMS, shortness of breath, and SVT. She was diagnosed with septic shock due to pneumonia and required intubation.  Patient was unable to wean and required trach on March 28.  Patient now on trach collar.  Patient is not safe for p.o. intake and so continues to have core track in place.  Patient severely encephalopathic.  Medical team has had difficulty getting in touch with patient's family.  PMT consulted to discuss goals of care. ? ?Subjective: ?Chart reviewed including labs, progress notes. ?Gina Bradley is awake and alert.  ?She continues to have some confusion and short term memory loss.  ?She does not recall that she had lunch today.  ?She does not recall my previous visits with her.  ?She continues to perseverate on her spouse not visiting her, not answering phone calls, and wanting to find her SUV.  ?Although very distracted she was able to tell me that she did not want her husband to be her decision maker. She would like her son Gina Bradley to be the decision maker if she is unable to make her decisions. Gina Bradley is blind- but he is able to receive and process information and she believes that he would have her best interests.  ?I reviewed HCPOA paperwork with her and will revisit her tomorrow  for completion.  ? ? ?Review of Systems  ?Constitutional:  Positive for malaise/fatigue.  ?Neurological:  Positive for weakness.  ? ? ?Physical Exam ?Vitals and nursing note reviewed.  ?Pulmonary:  ?   Effort: Pulmonary effort is normal.  ?Abdominal:  ?   General: Bowel sounds are normal.  ?   Palpations: Abdomen is soft.  ?Neurological:  ?   Mental Status: She is alert.  ?         ? ?Vital Signs: BP 120/66 (BP Location: Right Arm)   Pulse 95  Temp 98.3 ?F (36.8 ?C) (Oral)   Resp 20   Ht 5\' 5"  (1.651 m)   Wt 95.8 kg   LMP 01/05/2015   SpO2 95%   BMI 35.15 kg/m?  ?SpO2: SpO2: 95 % ?O2 Device: O2 Device: Tracheostomy Collar ?O2 Flow Rate: O2 Flow Rate (L/min): 5 L/min ? ?Intake/output summary:  ?Intake/Output Summary (Last 24 hours) at 07/27/2021 1631 ?Last data filed at 07/27/2021 1602 ?Gross per 24 hour  ?Intake 240 ml  ?Output 650 ml  ?Net -410 ml  ? ? ?LBM: Last BM Date : 07/22/21 ?Baseline Weight: Weight: 100 kg ?Most recent weight: Weight: 95.8 kg ? ?     ?Palliative Assessment/Data: PPS: 30% ? ? ? ?Flowsheet Rows   ? ?Flowsheet Row Most Recent Value  ?Intake Tab   ?Referral Department Hospitalist  ?Unit at Time of Referral Intermediate Care Unit  ?Palliative Care Primary Diagnosis Pulmonary  ?Date Notified 07/17/21  ?Palliative Care Type New Palliative care  ?Reason for referral Clarify Goals of Care  ?Date of Admission 06/22/21  ?Date first seen by Palliative Care 07/17/21  ?# of days Palliative referral response time 0 Day(s)  ?# of days IP prior to Palliative referral 25  ?Clinical Assessment   ?Psychosocial & Spiritual Assessment   ?Palliative Care Outcomes   ? ?  ? ? ?Patient Active Problem List  ? Diagnosis Date Noted  ?? Sepsis ([facility])   ?? Acute metabolic encephalopathy 06/23/2021  ?? Opioid dependence ([facility]) 06/23/2021  ?? Multifocal pneumonia 06/23/2021  ?? Obesity, Class III, BMI 40-49.9 (morbid obesity) ([facility]) 06/23/2021  ?? Transaminasemia 06/23/2021  ?? Acute respiratory failure with hypoxia  and hypercarbia ([facility]) 06/22/2021  ?? Severe sepsis ([facility]) 06/22/2021  ?? Right subacute Inferior pubic ramus fracture 06/22/2021  ?? Endometrial thickening on CT scan 06/22/2021  ?? Chronic heart failure with preserved ejection fraction (HFpEF) ([facility]) 06/22/2021  ?? Uncontrolled type 2 diabetes mellitus with hyperglycemia, without long-term current use of insulin ([facility]) 08/18/2020  ?? Tick bite of left lower leg   ?? Peripheral edema 08/17/2020  ?? Hypokalemia   ?? Class 1 obesity due to excess calories with body mass index (BMI) of 33.0 to 33.9 in adult   ?? Cellulitis   ?? Acidosis, metabolic 06/27/2019  ?? PMB (postmenopausal bleeding) 10/06/2016  ?? Chest pressure 01/05/2015  ?? Gastric erosion   ?? Encounter for screening colonoscopy 01/22/2014  ?? Constipation 01/20/2014  ?? GERD (gastroesophageal reflux disease) 01/20/2014  ?? Dyspepsia 01/20/2014  ?? PSVT (paroxysmal supraventricular tachycardia) ([facility]) 05/31/2013  ?? HTN (hypertension), benign 05/31/2013  ?? Hypercholesteremia 05/31/2013  ? ? ?Palliative Care Assessment & Plan  ? ? ?Assessment/Recommendations/Plan ? ?Continue full scope full code ?GOC are to d/c to SNF and eventually home- full code status ?HCPOA paperwork left for patient to review- she no longer wants 06/02/2013 to be her primary surrogate decision maker, she would like to designate her son Gina Bradley as primary decision maker ? ?Code Status: ?Full code ? ?Prognosis: ? Unable to determine ? ?Discharge Planning: ?To Be Determined ? ?Care plan was discussed with patient ? ?Thank you for allowing the Palliative Medicine Team to assist in the care of this patient. ? ?Gina Bradley, AGNP-C ?Palliative Medicine ? ? ?Please contact Palliative Medicine Team phone at [phone] for questions and concerns.  ? ? ? ? ? ? ?

## 2021-07-27 NOTE — Progress Notes (Signed)
?      ?                 PROGRESS NOTE ? ?      ?PATIENT DETAILS ?Name: Gina Bradley ?Age: 61 y.o. ?Sex: female ?Date of Birth: 11/03/1960 ?Admit Date: 06/22/2021 ?Admitting Physician Lilyan Gilford, DO ?FIE:PPIRJ, Lyman Bishop, MD ? ?Brief Summary: ?Patient is a 61 y.o.  female with history of DM-2, HTN, SVT, HFpEF, steroid-dependent COPD, opiate dependence, recent compression fracture at L4/right inferior pubic rami fracture-who presented to [facility] ED with altered mental status, SOB with hypoxia, SVT-she was thought to have septic shock due to PNA-she was subsequently intubated-started on broad-spectrum antibiotics/pressors and managed in the ICU.  She was subsequently transferred to [facility] ICU-she was stabilized-however she had failure to wean and underwent a tracheostomy on 3/28.  While in the ICU-she developed fever requiring ID consultation-ICU delirium/encephalopathy requiring on and off infusion of Precedex.  After stability-she was transferred to [facility] on 4/11-where further hospital course was complicated by persistent encephalopathy and MSSA PNA (sputum cultures on 4/10).  She was managed with supportive care-with significant clearing of her encephalopathy.  See below for further details.   ? ?Significant events: ?3/21>> Admit to [facility], intubated, start pressors, cardiology consult for SVT, start amiodarone ?3/24>> transfer to [facility] ?3/28>> Trach for failure to wean ?4/11>> transfer to [facility] ?4/17>> trach downsized to #6 cuffless ?4/18>> SLP reeval and MBS completed-dysphagia 1 diet started. ?4/19>> eating around 40% of some of her meals-at times able to feed herself. ?4/20>> Cortak tube removed ?4/21>> completely awake/alert ? ?Significant studies: ?3/21>> CT head: No acute abnormalities. ?3/21>> CTA chest: No PE, multifocal pneumonia.  Multiple subacute/chronic rib fractures. ?3/21>> CT abdomen/pelvis: No acute finding-thickened endometrium. ?3/23>> RUQ ultrasound: No gallstones ?3/25>> Echo: EF 65-70% ?3/31>> MRI  brain: No acute intracranial abnormality. ?4/01>> CT chest: Extensive multifocal patchy airspace consolidations. ?4/12>> EEG: No seizures. ? ?Significant microbiology data: ?3/21>> COVID/influenza PCR: Negative ?3/21>> blood culture: No growth ?3/21>> urine culture: No growth ?3/22>> tracheal aspirate: No growth ?3/23>> urine culture: 1000 colonies/mL of Staph aureus ?3/23>> blood culture: Staph hominis (contamination) ?3/27>> tracheal aspirate: No growth ?3/31>> blood culture 1/2: Staph epidermidis (contamination) ?4/01>> tracheal aspirate: No growth ?4/03>> blood culture: No growth ?4/05>> respiratory virus panel: Meta pneumonia virus ?4/10>> tracheal aspirate: MSSA ? ?Antimicrobial therapy: ?Doxycyline 3/22 >> 3/24 ?Ceftriaxone 3/21 >>3/25 ?Azithromycin 3/21 x1 dose ?Vancomycin 3/24 >>3/25; 4/1>> 4/4, 4/10 >>4/13 ?Zosyn 3/25 >> 3/28 ?Cefepime 3/31>> 4/4 ?Diflucan 4/1>> 4/4 ?Cefazolin 4/13>> 4/19. ? ?Procedures: ?3/28>> tracheostomy ? ?Consults: ?ID, PCCM, cardiology ? ?Subjective:  Patient in bed, appears comfortable, denies any headache, no fever, no chest pain or pressure, no shortness of breath , no abdominal pain. No new focal weakness. Says feels much better. ? ?Objective: ?Vitals: ?Blood pressure 123/80, pulse 99, temperature 98.4 ?F (36.9 ?C), temperature source Oral, resp. rate 20, height 5\' 5"  (1.651 m), weight 95.8 kg, last menstrual period 01/05/2015, SpO2 98 %.  ? ?Exam: ? ?Awake Alert, No new F.N deficits, Normal affect ?[place].AT,PERRAL ?Supple Neck,Trach site clear with PMV & TC  No JVD,   ?Symmetrical Chest wall movement, Good air movement bilaterally, CTAB ?RRR,No Gallops, Rubs or new Murmurs,  ?+ve B.Sounds, Abd Soft, No tenderness,   ?No Cyanosis, Clubbing or edema  ? ? ?Assessment/Plan: ? ?Septic shock, acute on chronic hypoxic and hypercapnic respiratory failure due to multifocal aspiration pneumonia requiring tracheostomy in ICU : Sepsis physiology has resolved-clinically improved-leukocytosis  still persists but has now started  to downtrend.  Chest pathophysiology has now resolved, she has completed her antibiotic treatment.  She also had viral infection with metapneumovirus.  She is currently on #6 cuffless trach with trach collar.  Clinically improving.  Continue to monitor.  PCCM managing trach site. ? ?Acute metabolic encephalopathy: Likely multifactorial etiology-due to PNA/ICU delirium/sepsis/polypharmacy.  Deficits much improved with supportive.  ? ?Dysphagia: Due to severe encephalopathy/recent trach-had a cortak tube in place-but with significant improvement in encephalopathy-cortak tubewas discontinued on 4/20.  Per nursing staff-significant improvement in her appetite.  Speech following on oral diet. ? ?Asymptomatic transaminitis: Unclear significance-could have been from sepsis-improved-RUQ ultrasound was without acute abnormalities. ? ?COPD: Continue bronchodilators-on chronic prednisone. ? ?SVT: unsuccessful cardioversion on initial presentation to the ED-subsequently required amiodarone infusion-currently maintaining sinus rhythm-on PO Lopressor. ? ?Acute on chronic HFpEF,  EF preserved at 65%: Volume status stable-continue Aldactone - PRN IV Lasix. ? ?Bilateral extremity lymphedema: Continues to have significant lower extremity edema- K rising - DC Aldactone -has required several doses of as needed IV Lasix. ? ?HTN: BP stable-continue Lopressor. ? ?HLD: Continue statin ?  ?GERD: Continue PPI ? ?Normocytic anemia: Due to acute illness-no evidence of blood loss-transfuse if Hb <7 ? ?Endometrial thickening on CT abdomen: Will require outpatient follow-up with GYN. ? ?Opiate dependence: Continue oxycodone-dosage has been gradually decreased to 10 mg every 8 hours.  She is on chronic narcotics at home.  ? ?Recent right pubic rami fracture: Supportive care at this point. ? ?Hypomagnesemia.  Replaced.   ? ?Mild hyponatremia.  Likely fluid overload will monitor.   ? ?Constipation.  Resolved after  bowel regimen continue. ? ?Palliative care: She wishes to be full code decisions to be made by husband according to the patient. ? ?Chronic pain.  On home dose narcotics.  Avoid overdose.  In no distress whatsoever.  Some narcotic seeking behavior  ? ?DM-2 (A1c 7.5 on 3/22): CBGs relatively stable-continue SSI ? ?CBG (last 3)  ?Recent Labs  ?  07/26/21 ?1550 07/26/21 ?2133 07/27/21 ?[id number]  ?GLUCAP 212* 133* 148*  ? ? ? ?Nutrition Status: ?Nutrition Problem: Inadequate oral intake ?Etiology: inability to eat ?Signs/Symptoms: NPO status ?Interventions: Tube feeding, Prostat ? ?Obesity ?Estimated body mass index is 35.15 kg/m? as calculated from the following: ?  Height as of this encounter: 5\' 5"  (1.651 m). ?  Weight as of this encounter: 95.8 kg.  ? ?Code status: ?  Code Status: Full Code  ? ?DVT Prophylaxis: Prophylactic Lovenox ? ?Family Communication: Spouse-[phone] called-no response on 4/21. ? ?Disposition Plan: ?Status is: Inpatient ?Remains inpatient appropriate because: Resolving septic shock-encephalopathy-plan is now for SNF-but per social work-can only be placed in SNF 1 month after trach placed.   ?  ?Planned Discharge Destination: SNF. ? ? ?Diet: ?Diet Order   ? ?       ?  DIET DYS 3 Room service appropriate? Yes with Assist; Fluid consistency: Thin  Diet effective now       ?  ? ?  ?  ? ?  ?  ?MEDICATIONS: ?Scheduled Meds: ? arformoterol  15 mcg Nebulization BID  ? aspirin  81 mg Oral Daily  ? atorvastatin  40 mg Oral Daily  ? budesonide (PULMICORT) nebulizer solution  0.5 mg Nebulization BID  ? chlorhexidine  15 mL Mouth Rinse BID  ? Chlorhexidine Gluconate Cloth  6 each Topical Q0600  ? clonazepam  0.25 mg Oral BID  ? docusate sodium  200 mg Oral BID  ? DULoxetine  20 mg Oral  Daily  ? enoxaparin (LOVENOX) injection  50 mg Subcutaneous Q24H  ? gabapentin  300 mg Oral Q8H  ? guaiFENesin  30 mL Oral Q6H  ? insulin aspart  0-9 Units Subcutaneous TID WC  ? mouth rinse  15 mL Mouth Rinse q12n4p  ?  metoprolol tartrate  25 mg Oral BID  ? pantoprazole sodium  40 mg Oral Daily  ? polyethylene glycol  17 g Oral BID  ? predniSONE  5 mg Oral Q breakfast  ? primidone  50 mg Oral QHS  ? QUEtiapine  100 m

## 2021-07-27 NOTE — Plan of Care (Signed)
  Problem: Education: Goal: Knowledge of General Education information will improve Description Including pain rating scale, medication(s)/side effects and non-pharmacologic comfort measures Outcome: Progressing   Problem: Health Behavior/Discharge Planning: Goal: Ability to manage health-related needs will improve Outcome: Progressing   

## 2021-07-27 NOTE — TOC Progression Note (Signed)
Transition of Care (TOC) - Progression Note  ? ? ?Patient Details  ?Name: Gina Bradley ?MRN: [id number] ?Date of Birth: 01/16/1961 ? ?Transition of Care (TOC) CM/SW Contact  ?Mearl Latin, LCSW ?Phone Number: ?07/27/2021, 3:10 PM ? ?Clinical Narrative:    ?No SNF bed offers currently; requested [place] review. Staffed case with TOC Supervisor.  ? ? 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 ?Expected Discharge Plan and Services ?Expected Discharge Plan: Skilled Nursing Facility ?In-house Referral: Clinical Social Work ?Discharge Planning Services: CM Consult ?Post Acute Care Choice:  (LTACH) ?Living arrangements for the past 2 months: Single Family Home ?                ?  ?  ?  ?  ?  ?  ?  ?  ?  ?  ? ? ?Social Determinants of Health (SDOH) Interventions ?  ? ?Readmission Risk Interventions ?   ? View : No data to display.  ?  ?  ?  ? ? ?

## 2021-07-27 NOTE — [place] FL2 (Signed)
?[place] MEDICAID FL2 LEVEL OF CARE SCREENING TOOL  ?  ? ?IDENTIFICATION  ?Patient Name: ?Gina Bradley Birthdate: 1961-01-24 Sex: female Admission Date (Current Location): ?06/22/2021  ?[place] and [place] Number: ? [place] ?  Facility and Address:  ?[facility], [place] [address], [place], [place] [address] ?     Provider Number: ?[id number]  ?Attending Physician Name and Address:  ?Thurnell Lose, MD ? Relative Name and Phone Number:  ?  ?   ?Current Level of Care: ?Hospital Recommended Level of Care: ?[place] Prior Approval Number: ?  ? ?Date Approved/Denied: ?  PASRR Number: ?[id number] ? ?Discharge Plan: ?SNF ?  ? ?Current Diagnoses: ?Patient Active Problem List  ? Diagnosis Date Noted  ? Sepsis ([place])   ? Acute metabolic encephalopathy [id number]  ? Opioid dependence ([place]) 06/23/2021  ? Multifocal pneumonia 06/23/2021  ? Obesity, Class III, BMI 40-49.9 (morbid obesity) ([place]) 06/23/2021  ? Transaminasemia 06/23/2021  ? Acute respiratory failure with hypoxia and hypercarbia ([place]) 06/22/2021  ? Severe sepsis ([place]) 06/22/2021  ? Right subacute Inferior pubic ramus fracture 06/22/2021  ? Endometrial thickening on CT scan 06/22/2021  ? Chronic heart failure with preserved ejection fraction (HFpEF) ([place]) 06/22/2021  ? Uncontrolled type 2 diabetes mellitus with hyperglycemia, without long-term current use of insulin ([place]) 08/18/2020  ? Tick bite of left lower leg   ? Peripheral edema 08/17/2020  ? Hypokalemia   ? Class 1 obesity due to excess calories with body mass index (BMI) of 33.0 to 33.9 in adult   ? Cellulitis   ? Acidosis, metabolic [id number]  ? PMB (postmenopausal bleeding) 10/06/2016  ? Chest pressure 01/05/2015  ? Gastric erosion   ? Encounter for screening colonoscopy 01/22/2014  ? Constipation 01/20/2014  ? GERD (gastroesophageal reflux disease) 01/20/2014  ? Dyspepsia 01/20/2014  ? PSVT (paroxysmal supraventricular tachycardia) ([place]) 05/31/2013  ? HTN  (hypertension), benign 05/31/2013  ? Hypercholesteremia 05/31/2013  ? ? ?Orientation RESPIRATION BLADDER Height & Weight   ?  ?Time, Situation ? Tracheostomy (Placed 06/29/21, 67mm [place], uncuffed, 28%FiO2 with 5L O2 flow) Incontinent, External catheter Weight: 211 lb 3.2 oz (95.8 kg) ?Height:  5\' 5"  (165.1 cm)  ?BEHAVIORAL SYMPTOMS/MOOD NEUROLOGICAL BOWEL NUTRITION STATUS  ?    Incontinent Diet (See dc summary)  ?AMBULATORY STATUS COMMUNICATION OF NEEDS Skin   ?Limited Assist Verbally Normal ?  ?  ?  ?    ?     ?     ? ? ?Personal Care Assistance Level of Assistance  ?Bathing, Feeding, Dressing Bathing Assistance: Limited assistance ?Feeding assistance: Limited assistance ?Dressing Assistance: Limited assistance ?   ? ?Functional Limitations Info  ?Sight Sight Info: Impaired ?  ?   ? ? ?SPECIAL CARE FACTORS FREQUENCY  ?PT (By licensed PT), OT (By licensed OT)   ?  ?PT Frequency: 5x/week ?OT Frequency: 5x/week ?  ?  ?  ?   ? ? ?Contractures Contractures Info: Not present  ? ? ?Additional Factors Info  ?Code Status, Allergies, Psychotropic, Insulin Sliding Scale Code Status Info: Full ?Allergies Info: Bee Venom, Tdap (Tetanus-diphth-acell Pertussis) ?Psychotropic Info: Seroquel ?Insulin Sliding Scale Info: See dc summary ?  ?   ? ?Current Medications (07/27/2021):  This is the current hospital active medication list ?Current Facility-Administered Medications  ?Medication Dose Route Frequency Provider Last Rate Last Admin  ? 0.9 %  sodium chloride infusion   Intravenous PRN Jacky Kindle, MD 10 mL/hr at 07/16/21 0400 Infusion Verify at 07/16/21 0400  ?  acetaminophen (TYLENOL) tablet 650 mg  650 mg Oral Q4H PRN Leroy Sea, MD   650 mg at 07/27/21 0904  ? albuterol (PROVENTIL) (2.5 MG/3ML) 0.083% nebulizer solution 2.5 mg  2.5 mg Nebulization Q4H PRN Coralyn Helling, MD   2.5 mg at 07/22/21 1627  ? arformoterol (BROVANA) nebulizer solution 15 mcg  15 mcg Nebulization BID Coralyn Helling, MD   15 mcg at 07/27/21 9201  ?  aspirin chewable tablet 81 mg  81 mg Oral Daily Leroy Sea, MD   81 mg at 07/27/21 0071  ? atorvastatin (LIPITOR) tablet 40 mg  40 mg Oral Daily Leroy Sea, MD   40 mg at 07/27/21 2197  ? budesonide (PULMICORT) nebulizer solution 0.5 mg  0.5 mg Nebulization BID Canary Brim L, NP   0.5 mg at 07/27/21 5883  ? calcium carbonate (TUMS - dosed in mg elemental calcium) chewable tablet 200 mg of elemental calcium  1 tablet Oral TID PRN Barbara Cower, NP      ? chlorhexidine (PERIDEX) 0.12 % solution 15 mL  15 mL Mouth Rinse BID Maretta Bees, MD   15 mL at 07/27/21 0906  ? Chlorhexidine Gluconate Cloth 2 % PADS 6 each  6 each Topical Q0600 Cleora Fleet, MD   6 each at 07/27/21 2549  ? clonazePAM (KLONOPIN) disintegrating tablet 0.25 mg  0.25 mg Oral BID Susa Raring K, MD   0.25 mg at 07/27/21 8264  ? diltiazem (CARDIZEM) injection 10 mg  10 mg Intravenous Q6H PRN Leroy Sea, MD      ? docusate sodium (COLACE) capsule 200 mg  200 mg Oral BID Leroy Sea, MD   200 mg at 07/27/21 1583  ? DULoxetine (CYMBALTA) DR capsule 20 mg  20 mg Oral Daily Leroy Sea, MD   20 mg at 07/27/21 1154  ? enoxaparin (LOVENOX) injection 50 mg  50 mg Subcutaneous Q24H Cheri Fowler, MD   50 mg at 07/27/21 0940  ? gabapentin (NEURONTIN) capsule 300 mg  300 mg Oral Q8H Leroy Sea, MD   300 mg at 07/27/21 0522  ? guaiFENesin (ROBITUSSIN) 100 MG/5ML liquid 30 mL  30 mL Oral Q6H Susa Raring K, MD   30 mL at 07/27/21 1154  ? haloperidol lactate (HALDOL) injection 2 mg  2 mg Intravenous Q6H PRN Leroy Sea, MD   2 mg at 07/27/21 1017  ? hydrALAZINE (APRESOLINE) injection 10 mg  10 mg Intravenous Q4H PRN Karl Ito, MD   10 mg at 06/30/21 0910  ? insulin aspart (novoLOG) injection 0-9 Units  0-9 Units Subcutaneous TID WC Howerter, Justin B, DO   2 Units at 07/27/21 1155  ? MEDLINE mouth rinse  15 mL Mouth Rinse q12n4p Maretta Bees, MD   15 mL at 07/26/21 1634  ? metoprolol  tartrate (LOPRESSOR) tablet 25 mg  25 mg Oral BID Leroy Sea, MD   25 mg at 07/27/21 7680  ? naloxone [facility]) injection 0.4 mg  0.4 mg Intravenous PRN Leroy Sea, MD      ? nitroGLYCERIN (NITROSTAT) SL tablet 0.4 mg  0.4 mg Sublingual Q30 min PRN Howerter, Justin B, DO      ? ondansetron (ZOFRAN) injection 4 mg  4 mg Intravenous Q6H PRN Laural Benes, Clanford L, MD   4 mg at 07/26/21 2210  ? oxyCODONE (Oxy IR/ROXICODONE) immediate release tablet 10 mg  10 mg Oral QID PRN Leroy Sea, MD  10 mg at 07/27/21 1153  ? pantoprazole sodium (PROTONIX) 40 mg/20 mL oral suspension 40 mg  40 mg Oral Daily Thurnell Lose, MD   40 mg at 07/27/21 [id number]  ? polyethylene glycol (MIRALAX / GLYCOLAX) packet 17 g  17 g Oral BID Thurnell Lose, MD   17 g at 07/27/21 [id number]  ? predniSONE (DELTASONE) tablet 5 mg  5 mg Oral Q breakfast Thurnell Lose, MD   5 mg at 07/27/21 [id number]  ? primidone (MYSOLINE) tablet 50 mg  50 mg Oral QHS Thurnell Lose, MD   50 mg at 07/26/21 2132  ? QUEtiapine (SEROQUEL) tablet 100 mg  100 mg Oral BID Thurnell Lose, MD   100 mg at 07/27/21 [id number]  ? revefenacin (YUPELRI) nebulizer solution 175 mcg  175 mcg Nebulization Daily Chesley Mires, MD   175 mcg at 07/27/21 [id number]  ? sodium chloride flush (NS) 0.9 % injection 10-40 mL  10-40 mL Intracatheter PRN Jonetta Osgood, MD   10 mL at 07/24/21 2217  ? ? ? ?Discharge Medications: ?Please see discharge summary for a list of discharge medications. ? ?Relevant Imaging Results: ? ?Relevant Lab Results: ? ? ?Additional Information ?SSN: [id number]. ? ?Benard Halsted, LCSW ? ? ? ? ?

## 2021-07-27 NOTE — Progress Notes (Signed)
Nutrition Follow-up ? ?DOCUMENTATION CODES:  ? ?Obesity unspecified ? ?INTERVENTION:  ? ?Encourage good PO intake  ?Multivitamin w/ minerals daily ?Ensure Enlive po BID, each supplement provides 350 kcal and 20 grams of protein. ?  ?NUTRITION DIAGNOSIS:  ? ?Inadequate oral intake related to inability to eat as evidenced by NPO status. - Ongoing, being addressed via TF ? ?GOAL:  ? ?Patient will meet greater than or equal to 90% of their needs - Met via TF ? ?MONITOR:  ? ?PO intake, Supplement acceptance, Labs, Weight trends ? ?REASON FOR ASSESSMENT:  ? ?Ventilator ?  ? ?ASSESSMENT:  ? ?61 year old female with PMH of T2DM, CHF, COPD, GERD, chronic lymphedema who presented with AMS and required intubation. ? ?03/24 - transferred to [facility] ?03/28 - s/p tracheostomy ?03/29 - Cortrak placed (tip gastric) ?04/17 - MBS w/ SLP, recommend NPO ?04/18 - MBS w/ SLP, recommend Dysphagia 1, thin liquids ?04/20 - Cortrak removed  ?04/23 - diet advanced to Dysphagia 3, think liquids ? ?Pt working with therapy at time of RD visit. ? ?No meal completions recorded. Noted that pt mental status continues to wax and wean; RD to order supplements.  ? ?Palliative continues to follow and aid in goals of care discussions.  ? ?Medications reviewed and include: Colace, SSI 0-9 units TID, Protonix, Miralax, Prednisone ?Labs reviewed: Sodium 131, Phosphorus 4.7, Magnesium 1.6, CBG 133-212 ? ?Diet Order:   ?Diet Order   ? ?       ?  DIET DYS 3 Room service appropriate? Yes with Assist; Fluid consistency: Thin  Diet effective now       ?  ? ?  ?  ? ?  ? ?EDUCATION NEEDS:  ? ?No education needs have been identified at this time ? ?Skin:  Skin Assessment: Reviewed RN Assessment ? ?Last BM:  4/24  - Type 5 ? ?Height:  ?Ht Readings from Last 1 Encounters:  ?06/30/21 5' 5"  (1.651 m)  ? ?Weight: ?Wt Readings from Last 1 Encounters:  ?07/24/21 95.8 kg  ? ?Ideal Body Weight:  56.8 kg ? ?BMI:  Body mass index is 35.15 kg/m?. ? ?Estimated Nutritional Needs:   ? ?Kcal:  1900-2100 ? ?Protein:  95-110 grams ? ?Fluid:  >/= 1.9 L ? ? ?Hermina Barters RD, LDN ?Clinical Dietitian ?See AMiON for contact information.  ?

## 2021-07-27 NOTE — Progress Notes (Signed)
Speech Language Pathology Treatment: Dysphagia  ?Patient Details ?Name: Gina Bradley ?MRN: [id number] ?DOB: October 29, 1960 ?Today's Date: 07/27/2021 ?Time: 2831-5176 ?SLP Time Calculation (min) (ACUTE ONLY): 20 min ? ?Assessment / Plan / Recommendation ?Clinical Impression ? Patient seen by SLP for skilled treatment session focused on PMV usage and patient education. Patient was alert with PMV already in place and sitting in recliner when SLP entered room. Voice was clear and strong and patient able to communicate without difficulty at conversational level. SLP showed patient PMV valve using phone camera and showed her donning and doffing, however did not do any training of patient as she continues to be confused and with poor memory. SLP discussed patient's PO consistencies as she was recently upgraded to Dys 3 solids but she reported that her dentures were missing. SLP reviewed notes in chart and dentures were last seen on 4/23 but have been missing since. SLP provided patient with some paper and encouraged her to write down questions she has about her care as well as the answers she gets. SLP wrote down one question which was "What is the plan for trach?" as patient wondering if she will continue to require it. Overall, patient continues to improve in all areas. When SLP left room, he heard patient talking as if having a conversation with someone in the room although no one was present. SLP to continue to follow patient for dysphagia and PMV treatment but as dentures are now missing, likely will not be able to upgrade her PO textures to regular solids. ? ?  ?HPI HPI: 61 yo female former smoker presented to [facility] ER on 3/21 with AMS, fever.  Found to have multifocal pneumonia and required intubation and pressors.  Transferred to [facility] for further management. ETT 3/21; trach 3/28 secondary to failure to wean; TC trials 4/8. Dx acute metabolic encephalopathy, pmhx peripheral neuropathy, HF, HTN, DM2. ?  ?   ?SLP Plan ?  Continue with current plan of care ? ?  ?  ?Recommendations for follow up therapy are one component of a multi-disciplinary discharge planning process, led by the attending physician.  Recommendations may be updated based on patient status, additional functional criteria and insurance authorization. ?  ? ?Recommendations  ?   ?   ?    ?   ? ? ? ? Follow Up Recommendations: SLP at Long-term acute care hospital ?Assistance recommended at discharge: Frequent or constant Supervision/Assistance ?SLP Visit Diagnosis: Aphonia (R49.1) ?Plan: Continue with current plan of care ? ? ? ? ?  ?  ?Chesley Nevin, MA, CCC-SLP ?Speech Therapy ? ?

## 2021-07-28 DIAGNOSIS — G9341 Metabolic encephalopathy: Secondary | ICD-10-CM | POA: Diagnosis not present

## 2021-07-28 DIAGNOSIS — I5032 Chronic diastolic (congestive) heart failure: Secondary | ICD-10-CM | POA: Diagnosis not present

## 2021-07-28 DIAGNOSIS — Z7189 Other specified counseling: Secondary | ICD-10-CM | POA: Diagnosis not present

## 2021-07-28 DIAGNOSIS — J9601 Acute respiratory failure with hypoxia: Secondary | ICD-10-CM | POA: Diagnosis not present

## 2021-07-28 DIAGNOSIS — J9602 Acute respiratory failure with hypercapnia: Secondary | ICD-10-CM | POA: Diagnosis not present

## 2021-07-28 LAB — CBC WITH DIFFERENTIAL/PLATELET
Abs Immature Granulocytes: 0.19 10*3/uL — ABNORMAL HIGH (ref 0.00–0.07)
Basophils Absolute: 0.1 10*3/uL (ref 0.0–0.1)
Basophils Relative: 1 %
Eosinophils Absolute: 0.3 10*3/uL (ref 0.0–0.5)
Eosinophils Relative: 3 %
HCT: 35.3 % — ABNORMAL LOW (ref 36.0–46.0)
Hemoglobin: 11.2 g/dL — ABNORMAL LOW (ref 12.0–15.0)
Immature Granulocytes: 2 %
Lymphocytes Relative: 33 %
Lymphs Abs: 3.2 10*3/uL (ref 0.7–4.0)
MCH: 30.8 pg (ref 26.0–34.0)
MCHC: 31.7 g/dL (ref 30.0–36.0)
MCV: 97 fL (ref 80.0–100.0)
Monocytes Absolute: 1.1 10*3/uL — ABNORMAL HIGH (ref 0.1–1.0)
Monocytes Relative: 12 %
Neutro Abs: 4.8 10*3/uL (ref 1.7–7.7)
Neutrophils Relative %: 49 %
Platelets: 282 10*3/uL (ref 150–400)
RBC: 3.64 MIL/uL — ABNORMAL LOW (ref 3.87–5.11)
RDW: 17.7 % — ABNORMAL HIGH (ref 11.5–15.5)
WBC: 9.7 10*3/uL (ref 4.0–10.5)
nRBC: 0 % (ref 0.0–0.2)

## 2021-07-28 LAB — COMPREHENSIVE METABOLIC PANEL
ALT: 29 U/L (ref 0–44)
AST: 46 U/L — ABNORMAL HIGH (ref 15–41)
Albumin: 3 g/dL — ABNORMAL LOW (ref 3.5–5.0)
Alkaline Phosphatase: 88 U/L (ref 38–126)
Anion gap: 12 (ref 5–15)
BUN: 12 mg/dL (ref 6–20)
CO2: 22 mmol/L (ref 22–32)
Calcium: 9.1 mg/dL (ref 8.9–10.3)
Chloride: 96 mmol/L — ABNORMAL LOW (ref 98–111)
Creatinine, Ser: 0.72 mg/dL (ref 0.44–1.00)
GFR, Estimated: >60 mL/min (ref >60)
Glucose, Bld: 160 mg/dL — ABNORMAL HIGH (ref 70–99)
Potassium: 4.3 mmol/L (ref 3.5–5.1)
Sodium: 130 mmol/L — ABNORMAL LOW (ref 135–145)
Total Bilirubin: 0.8 mg/dL (ref 0.3–1.2)
Total Protein: 6.5 g/dL (ref 6.5–8.1)

## 2021-07-28 LAB — GLUCOSE, CAPILLARY
Glucose-Capillary: 140 mg/dL — ABNORMAL HIGH (ref 70–99)
Glucose-Capillary: 188 mg/dL — ABNORMAL HIGH (ref 70–99)
Glucose-Capillary: 189 mg/dL — ABNORMAL HIGH (ref 70–99)
Glucose-Capillary: 181 mg/dL — ABNORMAL HIGH (ref 70–99)

## 2021-07-28 LAB — UREA NITROGEN, URINE: Urea Nitrogen, Ur: 459 mg/dL

## 2021-07-28 LAB — MAGNESIUM: Magnesium: 1.6 mg/dL — ABNORMAL LOW (ref 1.7–2.4)

## 2021-07-28 LAB — SODIUM, URINE, RANDOM: Sodium, Ur: 18 mmol/L

## 2021-07-28 LAB — BRAIN NATRIURETIC PEPTIDE: B Natriuretic Peptide: 23.8 pg/mL (ref 0.0–100.0)

## 2021-07-28 MED ORDER — MAGNESIUM SULFATE 4 GM/100ML IV SOLN
4.0000 g | Freq: Once | INTRAVENOUS | Status: AC
  Administered 2021-07-28: 4 g via INTRAVENOUS
  Filled 2021-07-28: qty 100

## 2021-07-28 MED ORDER — METOPROLOL TARTRATE 50 MG PO TABS
50.0000 mg | ORAL_TABLET | Freq: Two times a day (BID) | ORAL | Status: DC
Start: 2021-07-28 — End: 2021-07-29
  Administered 2021-07-28: 50 mg via ORAL
  Filled 2021-07-28: qty 1

## 2021-07-28 MED ORDER — METOPROLOL TARTRATE 25 MG PO TABS
25.0000 mg | ORAL_TABLET | Freq: Once | ORAL | Status: AC
  Administered 2021-07-28: 25 mg via ORAL
  Filled 2021-07-28: qty 1

## 2021-07-28 MED ORDER — FUROSEMIDE 10 MG/ML IJ SOLN
40.0000 mg | Freq: Once | INTRAMUSCULAR | Status: AC
  Administered 2021-07-28: 40 mg via INTRAVENOUS
  Filled 2021-07-28: qty 4

## 2021-07-28 NOTE — Progress Notes (Signed)
? ?  NAME:  NISHIKA PARKHURST, MRN:  [id number], DOB:  06-Dec-1960, LOS: 36 ?ADMISSION DATE:  06/22/2021, CONSULTATION DATE:  3/21 ?REFERRING MD:  Laural Benes, [place], CHIEF COMPLAINT:  acute resp failure/ fever wth AMS and SVT ? ?History of Present Illness:  ?61 y/o female former smoker presented to [facility] ER on 3/21 with AMS, fever.  Found to have multifocal pneumonia and required intubation and pressors.  Transferred to [facility] for further management. ? ?Pertinent Medical History:  ?Anemia, Back pain, Spinal stenosis COPD, DM type 2, HTN, GERD, CHF, Lymphedema, Neuropathy, SVT ? ?Significant Hospital Events: ?Including procedures, antibiotic start and stop dates in addition to other pertinent events   ?3/21 Admit to [facility], intubated, start pressors, cardiology consult for SVT, start amiodarone ?3/24 transfer to [facility] ?3/28 Trach for failure to wean ?3/31 Fever, hypotension, worsening leukocytosis. Started on levophed and antibiotics. ?4/2 PICC removed for line holiday; ID following ?4/4 off precedex;and fentanyl; ID plan to stop cefepime and vanc ?4/5 remains off precedex and fentanyl; failed PSV early in morning ?4/6 remains off continuous sedation; failed PSV this morning due to desaturation ?4/9 Tolerated being off the vent for about 8 hours yesterday 4/8 ?4/10 WBC elevated, copious thick yellow secretions, restless / agitated  ?4/17 #8 cuffed trach, 28% ATC. Planned trach change to #6 cuffless ?4/24 Tolerating ATC, PMV. ? ?Interim History / Subjective:  ?No distress  ? ?Objective:  ?Blood pressure 114/70, pulse 84, temperature 98.1 ?F (36.7 ?C), temperature source Axillary, resp. rate 15, height 5\' 5"  (1.651 m), weight 94.3 kg, last menstrual period 01/05/2015, SpO2 97 %. ?   ?FiO2 (%):  [28 %] 28 %  ? ?Intake/Output Summary (Last 24 hours) at 07/28/2021 1121 ?Last data filed at 07/28/2021 07/30/2021 ?Gross per 24 hour  ?Intake 11.54 ml  ?Output 200 ml  ?Net -188.46 ml  ? ? ?Filed Weights  ? 07/22/21 0500 07/24/21 0421 07/28/21 0500   ?Weight: 96.6 kg 95.8 kg 94.3 kg  ? ?Physical Examination: ?General obese 61 year old female. Sitting up in chair. No distress.  ?HENT NCAT does have large neck currently w/ 6 shiley,. Good phonation no stridor w/ trach occlusion.  ?Pulm clear  ?Card rrr ?Abd soft ?Ext chronic LE edema ?Neuro intact.  ? ?Resolved Problems:  ?Septic shock, Elevated troponin from demand ischemia ?Urinary retention ? ?Assessment & Plan:  ? ?Acute on Chronic Hypoxic / Hypercapnic Respiratory Failure secondary to Multifocal Aspiration PNA, Metapneumovirus, AECOPD, and Metabolic Encephalopathy s/p Tracheostomy  ?Trach placed 3/28 for failure to vent wean.  Downsized to #6 Shiley cuffless 4/17. ?-Her speech quality and endurance is excellent w/ PMV.  ?-I do suspect she has element of underlying OSA> looks like her last PSG was in 2018; that being said I think it is reasonable to assess her for decannulation (in fact safer to do this here in the hospital vs out pt setting) ?Plan ?Start capping trial w/ continuous pulse ox ?Will ask nursing  and PT to record any episodes of desaturation  ?Cont supplemental oxygen  ?Cpnt Pulmicort/brovana and Yupelri  ?Cont pred 10/daily ?Will see again tomorrow. If no issues overnight I think we can decannulate  ? ? ?Best Practice (right click and "Reselect all SmartList Selections" daily)  ?Per primary  ? ?Signature:  ? ?2019 ACNP-BC ?[place] [facility] ?Pager # [phone] OR # [phone] if no answer ? ? ? ?

## 2021-07-28 NOTE — Progress Notes (Signed)
?      ?                 PROGRESS NOTE ? ?      ?PATIENT DETAILS ?Name: Gina Bradley ?Age: 61 y.o. ?Sex: female ?Date of Birth: 1961/03/23 ?Admit Date: 06/22/2021 ?Admitting Physician Rolla Plate, DO ?[id number], Purcell Nails, MD ? ?Brief Summary: ?Patient is a 61 y.o.  female with history of DM-2, HTN, SVT, HFpEF, steroid-dependent COPD, opiate dependence, recent compression fracture at L4/right inferior pubic rami fracture-who presented to [facility] ED with altered mental status, SOB with hypoxia, SVT-she was thought to have septic shock due to PNA-she was subsequently intubated-started on broad-spectrum antibiotics/pressors and managed in the ICU.  She was subsequently transferred to [facility] ICU-she was stabilized-however she had failure to wean and underwent a tracheostomy on 3/28.  While in the ICU-she developed fever requiring ID consultation-ICU delirium/encephalopathy requiring on and off infusion of Precedex.  After stability-she was transferred to [facility] on 4/11-where further hospital course was complicated by persistent encephalopathy and MSSA PNA (sputum cultures on 4/10).  She was managed with supportive care-with significant clearing of her encephalopathy.  See below for further details.   ? ?Significant events: ?3/21>> Admit to [facility], intubated, start pressors, cardiology consult for SVT, start amiodarone ?3/24>> transfer to [facility] ?3/28>> Trach for failure to wean ?4/11>> transfer to [facility] ?4/17>> trach downsized to #6 cuffless ?4/18>> SLP reeval and MBS completed-dysphagia 1 diet started. ?4/19>> eating around 40% of some of her meals-at times able to feed herself. ?4/20>> Cortak tube removed ?4/21>> completely awake/alert ? ?Significant studies: ?3/21>> CT head: No acute abnormalities. ?3/21>> CTA chest: No PE, multifocal pneumonia.  Multiple subacute/chronic rib fractures. ?3/21>> CT abdomen/pelvis: No acute finding-thickened endometrium. ?3/23>> RUQ ultrasound: No gallstones ?3/25>> Echo: EF 65-70% ?3/31>> MRI  brain: No acute intracranial abnormality. ?4/01>> CT chest: Extensive multifocal patchy airspace consolidations. ?4/12>> EEG: No seizures. ? ?Significant microbiology data: ?3/21>> COVID/influenza PCR: Negative ?3/21>> blood culture: No growth ?3/21>> urine culture: No growth ?3/22>> tracheal aspirate: No growth ?3/23>> urine culture: 1000 colonies/mL of Staph aureus ?3/23>> blood culture: Staph hominis (contamination) ?3/27>> tracheal aspirate: No growth ?3/31>> blood culture 1/2: Staph epidermidis (contamination) ?4/01>> tracheal aspirate: No growth ?4/03>> blood culture: No growth ?4/05>> respiratory virus panel: Meta pneumonia virus ?4/10>> tracheal aspirate: MSSA ? ?Antimicrobial therapy: ?Doxycyline 3/22 >> 3/24 ?Ceftriaxone 3/21 >>3/25 ?Azithromycin 3/21 x1 dose ?Vancomycin 3/24 >>3/25; 4/1>> 4/4, 4/10 >>4/13 ?Zosyn 3/25 >> 3/28 ?Cefepime 3/31>> 4/4 ?Diflucan 4/1>> 4/4 ?Cefazolin 4/13>> 4/19. ? ?Procedures: ?3/28>> tracheostomy ? ?Consults: ?ID, PCCM, cardiology ? ?Subjective:  Patient in bed, appears comfortable, denies any headache, no fever, no chest pain or pressure, no shortness of breath , no abdominal pain. No new focal weakness. Says feels much better. ? ?Objective: ?Vitals: ?Blood pressure 114/70, pulse 84, temperature 98.1 ?F (36.7 ?C), temperature source Axillary, resp. rate 15, height 5\' 5"  (1.651 m), weight 94.3 kg, last menstrual period 01/05/2015, SpO2 97 %.  ? ?Exam: ? ?Awake Alert, No new F.N deficits, Normal affect ?[place].AT,PERRAL ?Supple Neck,Trach site clear with PMV & TC  No JVD,   ?Symmetrical Chest wall movement, Good air movement bilaterally, CTAB ?RRR,No Gallops, Rubs or new Murmurs,  ?+ve B.Sounds, Abd Soft, No tenderness,   ?No Cyanosis, Clubbing or edema  ? ? ?Assessment/Plan: ? ?Septic shock, acute on chronic hypoxic and hypercapnic respiratory failure due to multifocal aspiration pneumonia requiring tracheostomy in ICU : Sepsis physiology has resolved-clinically  improved-leukocytosis still persists but has now started  to downtrend.  Chest pathophysiology has now resolved, she has completed her antibiotic treatment.  She also had viral infection with metapneumovirus.  She is currently on #6 cuffless trach with trach collar.  Clinically improving.  Continue to monitor.  PCCM managing trach site, DW PCCM 07/28/21. ? ?Acute metabolic encephalopathy: Likely multifactorial etiology-due to PNA/ICU delirium/sepsis/polypharmacy.  Deficits much improved with supportive.  ? ?Dysphagia: Due to severe encephalopathy/recent trach-had a cortak tube in place-but with significant improvement in encephalopathy-cortak tubewas discontinued on 4/20.  Per nursing staff-significant improvement in her appetite.  Speech following on oral diet. ? ?Asymptomatic transaminitis: Unclear significance-could have been from sepsis-improved-RUQ ultrasound was without acute abnormalities. ? ?COPD: Continue bronchodilators-on chronic prednisone. ? ?SVT: unsuccessful cardioversion on initial presentation to the ED-subsequently required amiodarone infusion-currently maintaining sinus rhythm-on PO Lopressor dose increased 07/28/21. ? ?Acute on chronic HFpEF,  EF preserved at 65%: Volume status stable-continue Aldactone - PRN IV Lasix. ? ?Bilateral extremity lymphedema: Continues to have significant lower extremity edema- K rising - DC Aldactone - as required several doses of as needed IV Lasix. ? ?HTN: BP stable-continue Lopressor. ? ?HLD: Continue statin ?  ?GERD: Continue PPI ? ?Normocytic anemia: Due to acute illness-no evidence of blood loss-transfuse if Hb <7 ? ?Endometrial thickening on CT abdomen: Will require outpatient follow-up with GYN. ? ?Opiate dependence: Continue oxycodone-dosage has been gradually decreased to 10 mg every 8 hours.  She is on chronic narcotics at home.  ? ?Recent right pubic rami fracture: Supportive care at this point. ? ?Hypomagnesemia.  Replaced.   ? ?Mild hyponatremia.  Likely  fluid overload will monitor.   ? ?Constipation.  Resolved after bowel regimen continue. ? ?Palliative care: She wishes to be full code decisions to be made by husband according to the patient. ? ?Chronic pain.  On home dose narcotics.  Avoid overdose.  In no distress whatsoever.  Some narcotic seeking behavior  ? ?DM-2 (A1c 7.5 on 3/22): CBGs relatively stable-continue SSI ? ?CBG (last 3)  ?Recent Labs  ?  07/27/21 ?1650 07/27/21 ?2107 07/28/21 ?0815  ?GLUCAP 193* 169* 140*  ? ? ? ?Nutrition Status: ?Nutrition Problem: Inadequate oral intake ?Etiology: inability to eat ?Signs/Symptoms: NPO status ?Interventions: Ensure Enlive (each supplement provides 350kcal and 20 grams of protein), MVI ? ?Obesity ?Estimated body mass index is 34.6 kg/m? as calculated from the following: ?  Height as of this encounter: 5\' 5"  (1.651 m). ?  Weight as of this encounter: 94.3 kg.  ? ?Code status: ?  Code Status: Full Code  ? ?DVT Prophylaxis: Prophylactic Lovenox ? ?Family Communication: Spouse-[phone] called-no response on 4/21. ? ?Disposition Plan: ?Status is: Inpatient ?Remains inpatient appropriate because: Resolving septic shock-encephalopathy-plan is now for SNF-but per social work-can only be placed in SNF 1 month after trach placed.   ?  ?Planned Discharge Destination: SNF. ? ? ?Diet: ?Diet Order   ? ?       ?  DIET DYS 3 Room service appropriate? Yes with Assist; Fluid consistency: Thin  Diet effective now       ?  ? ?  ?  ? ?  ?  ?MEDICATIONS: ?Scheduled Meds: ? arformoterol  15 mcg Nebulization BID  ? aspirin  81 mg Oral Daily  ? atorvastatin  40 mg Oral Daily  ? budesonide (PULMICORT) nebulizer solution  0.5 mg Nebulization BID  ? chlorhexidine  15 mL Mouth Rinse BID  ? Chlorhexidine Gluconate Cloth  6 each Topical Q0600  ? clonazepam  0.25 mg Oral BID  ?  docusate sodium  200 mg Oral BID  ? DULoxetine  20 mg Oral Daily  ? enoxaparin (LOVENOX) injection  50 mg Subcutaneous Q24H  ? feeding supplement  237 mL Oral  BID BM  ? furosemide  40 mg Intravenous Once  ? gabapentin  300 mg Oral Q8H  ? guaiFENesin  30 mL Oral Q6H  ? insulin aspart  0-9 Units Subcutaneous TID WC  ? mouth rinse  15 mL Mouth Rinse q12n4p  ? metoprolol tartrate  2

## 2021-07-28 NOTE — Progress Notes (Signed)
Physical Therapy Treatment ?Patient Details ?Name: Gina Bradley ?MRN: [id number] ?DOB: 02/09/1961 ?Today's Date: 07/28/2021 ? ? ?History of Present Illness 61 yo female former smoker presented to [facility] ER on 3/21 with AMS, fever.  Found to have multifocal pneumonia and required intubation and pressors.  Transferred to [facility] 06/25/21 for treatment of acute on chronic hypoxic/hypercapnic respiratory failure from PNA, COPD with acute COPD, paroxysmal SVT, acute on chronic diastolic CHF, and acute metabolic encephalopathy in presence of peripheral neuropathy, back pain and chronic opiate use. PMH: CAD, smoker, heart failure, GERD, type 2 DM, hypertension, SVT, peripheral neuropathy, steroid dependent COPD, chronic pain history of DVT, chronic lymphedema, spinal stenosis, was recently diagnosed with compression deformity of the L4 vertebrae and subacute fracture of the right inferior pubic rami seen in the ED on 06/06/2021 after fall. ? ?  ?PT Comments  ? ? Patient alert on arrival. Able to perform transfers with RW and min assist, however does get dyspneic despite maintaining on 28% trach collar and requires rest breaks between bouts of activity  (sats 94-95%). Able to advance to ambulation x 5 feet with RW and min assist +2 chair follow.  ?   ?Recommendations for follow up therapy are one component of a multi-disciplinary discharge planning process, led by the attending physician.  Recommendations may be updated based on patient status, additional functional criteria and insurance authorization. ? ?Follow Up Recommendations ? Skilled nursing-short term rehab (<3 hours/day) ?  ?  ?Assistance Recommended at Discharge Frequent or constant Supervision/Assistance  ?Patient can return home with the following Two people to help with walking and/or transfers;A lot of help with bathing/dressing/bathroom;Assistance with cooking/housework;Assistance with feeding;Direct supervision/assist for medications management;Direct  supervision/assist for financial management;Assist for transportation;Help with stairs or ramp for entrance ?  ?Equipment Recommendations ? BSC/3in1;Wheelchair (measurements PT);Wheelchair cushion (measurements PT);Hospital bed;Other (comment)  ?  ?Recommendations for Other Services   ? ? ?  ?Precautions / Restrictions Precautions ?Precautions: Fall ?Precaution Comments: hx of fall with L4 compression fx ?Restrictions ?Weight Bearing Restrictions: No  ?  ? ?Mobility ? Bed Mobility ?Overal bed mobility: Needs Assistance ?Bed Mobility: Supine to Sit ?Rolling: Modified independent (Device/Increase time) ?Sidelying to sit: HOB elevated, Supervision ?  ?  ?  ?General bed mobility comments: supervision with cues for sequencing and to stay on task ?  ? ?Transfers ?Overall transfer level: Needs assistance ?Equipment used: Rolling walker (2 wheels) ?Transfers: Sit to/from Stand, Bed to chair/wheelchair/BSC ?Sit to Stand: Min assist ?  ?Step pivot transfers: Min assist, +2 safety/equipment ?  ?  ?  ?General transfer comment: Pt completed stand pivot with the RW for support and took small steps to the [facility] and then to recliner; x2 from recliner to [place] ?  ? ?Ambulation/Gait ?Ambulation/Gait assistance: Min assist, +2 safety/equipment ?Gait Distance (Feet): 5 Feet ?Assistive device: Rolling walker (2 wheels) ?Gait Pattern/deviations: Step-to pattern, Decreased stride length, Wide base of support ?  ?  ?  ?General Gait Details: support for advancing RW and balance; +dyspnea with sats 95% on 28% trach collar ? ? ?Stairs ?  ?  ?  ?  ?  ? ? ?Wheelchair Mobility ?  ? ?Modified Rankin (Stroke Patients Only) ?  ? ? ?  ?Balance Overall balance assessment: Needs assistance ?Sitting-balance support: Feet supported ?Sitting balance-Leahy Scale: Fair ?Sitting balance - Comments: at EOB did not require UE support to maintain ?  ?Standing balance support: Bilateral upper extremity supported, During functional activity ?Standing balance-Leahy  Scale: Poor ?Standing balance comment:  Pt needs UE support on the RW for completion of transfer. ?  ?  ?  ?  ?  ?  ?  ?  ?  ?  ?  ?  ? ?  ?Cognition Arousal/Alertness: Awake/alert ?Behavior During Therapy: [facility] for tasks assessed/performed ?Overall Cognitive Status: Impaired/Different from baseline ?Area of Impairment: Orientation, Attention, Memory, Following commands, Awareness, Problem solving ?  ?  ?  ?  ?  ?  ?  ?  ?Orientation Level: Disoriented to, Time ?Current Attention Level: Sustained ?Memory: Decreased short-term memory ?Following Commands: Follows one step commands with increased time ?Safety/Judgement: Decreased awareness of safety, Decreased awareness of deficits ?Awareness: Intellectual ?Problem Solving: Requires tactile cues, Requires verbal cues, Decreased initiation, Slow processing, Difficulty sequencing ?General Comments: Pt needs max cueing for sustained attention and for initiation of transfers ?  ?  ? ?  ?Exercises   ? ?  ?General Comments General comments (skin integrity, edema, etc.): Attempted pt on room air with sats decr to 88% at rest; during activity pt on 28% trach collar ?  ?  ? ?Pertinent Vitals/Pain Pain Assessment ?Pain Assessment: No/denies pain  ? ? ?Home Living Family/patient expects to be discharged to:: Private residence ?Living Arrangements: Spouse/significant other ?Available Help at Discharge: Family;Available PRN/intermittently ?Type of Home: House ?Home Access: Stairs to enter ?  ?Entrance Stairs-Number of Steps: 1 ?  ?Home Layout: One level ?Home Equipment: Other (comment) (hemi walker) ?Additional Comments: per chart review, pt unable to report  ?  ?Prior Function    ?  ?  ?   ? ?PT Goals (current goals can now be found in the care plan section) Acute Rehab PT Goals ?Patient Stated Goal: wants to get stronger ?Time For Goal Achievement: 08/05/21 ?Potential to Achieve Goals: Fair ?Progress towards PT goals: Progressing toward goals ? ?  ?Frequency ? ? ? Min  3X/week ? ? ? ?  ?PT Plan    ? ? ?Co-evaluation   ?  ?  ?  ?  ? ?  ?AM-PAC PT "6 Clicks" Mobility   ?Outcome Measure ? Help needed turning from your back to your side while in a flat bed without using bedrails?: None ?Help needed moving from lying on your back to sitting on the side of a flat bed without using bedrails?: None ?Help needed moving to and from a bed to a chair (including a wheelchair)?: A Little ?Help needed standing up from a chair using your arms (e.g., wheelchair or bedside chair)?: A Little ?Help needed to walk in hospital room?: Total ?Help needed climbing 3-5 steps with a railing? : Total ?6 Click Score: 16 ? ?  ?End of Session Equipment Utilized During Treatment: Oxygen (28% trach collar) ?Activity Tolerance: Patient limited by fatigue ?Patient left: in chair;with chair alarm set;with call bell/phone within reach ?Nurse Communication: Mobility status;Other (comment) (bleeding from perineal area?) ?PT Visit Diagnosis: Unsteadiness on feet (R26.81);Muscle weakness (generalized) (M62.81);Other abnormalities of gait and mobility (R26.89);History of falling (Z91.81);Difficulty in walking, not elsewhere classified (R26.2) ?Pain - Right/Left: Left ?Pain - part of body: Hip ?  ? ? ?Time: [id number] ?PT Time Calculation (min) (ACUTE ONLY): 30 min ? ?Charges:  $Gait Training: 8-22 mins ?$Therapeutic Activity: 8-22 mins          ?          ? ? ?Arby Barrette, PT ?Acute Rehabilitation Services  ?Pager [phone] ?Office [phone] ? ? ? ?Jeanie Cooks Delainee Tramel ?07/28/2021, 10:16 AM ? ?

## 2021-07-28 NOTE — Progress Notes (Signed)
Pt's trach capped and placed on 2L [place]. Tolerating well at this time. ?

## 2021-07-28 NOTE — Progress Notes (Signed)
Occupational Therapy Treatment ?Patient Details ?Name: Gina Bradley ?MRN: [id number] ?DOB: [birth year] ?Today's Date: 07/28/2021 ? ? ?History of present illness 61 yo female former smoker presented to [facility] ER on 3/21 with AMS, fever.  Found to have multifocal pneumonia and required intubation and pressors.  Transferred to [facility] 06/25/21 for treatment of acute on chronic hypoxic/hypercapnic respiratory failure from PNA, COPD with acute COPD, paroxysmal SVT, acute on chronic diastolic CHF, and acute metabolic encephalopathy in presence of peripheral neuropathy, back pain and chronic opiate use. PMH: CAD, smoker, heart failure, GERD, type 2 DM, hypertension, SVT, peripheral neuropathy, steroid dependent COPD, chronic pain history of DVT, chronic lymphedema, spinal stenosis, was recently diagnosed with compression deformity of the L4 vertebrae and subacute fracture of the right inferior pubic rami seen in the ED on 06/06/2021 after fall. ?  ?OT comments ? Pt currently supervision for UB bathing with max assist for LB bathing sit to stand secondary to decreased ability to maintain standing balance while letting go with an UE to wash herself.  Oxygen sats 88 or greater with standing and completion of ADL on 5Ls at 28% via trach collar.  HR increased up to 118 with standing as well.  Will continue to follow for acute care OT needs.     ? ?Recommendations for follow up therapy are one component of a multi-disciplinary discharge planning process, led by the attending physician.  Recommendations may be updated based on patient status, additional functional criteria and insurance authorization. ?   ?Follow Up Recommendations ? Skilled nursing-short term rehab (<3 hours/day)  ?  ?Assistance Recommended at Discharge Frequent or constant Supervision/Assistance  ?Patient can return home with the following ? A lot of help with walking and/or transfers;A lot of help with bathing/dressing/bathroom;Direct supervision/assist for medications  management;Help with stairs or ramp for entrance;Assistance with cooking/housework;Assist for transportation;Direct supervision/assist for financial management ?  ?Equipment Recommendations ? Other (comment) (TBD next venue of care)  ?  ?Recommendations for Other Services   ? ?  ?Precautions / Restrictions Precautions ?Precautions: Fall ?Precaution Comments: hx of fall with L4 compression fx ?Restrictions ?Weight Bearing Restrictions: No  ? ? ?  ? ?Mobility Bed Mobility ?  ?  ?  ?  ?  ?  ?  ?  ?  ? ?Transfers ?  ?Equipment used: Rolling walker (2 wheels) ?Transfers: Sit to/from Stand, Bed to chair/wheelchair/BSC ?Sit to Stand: Min assist ?  ?  ?Step pivot transfers: Min assist ?  ?  ?General transfer comment: Pt completed sit to stand X 3 for LB bathing tasks, but unable to maintain unless holding onto the RW. ?  ?  ?Balance Overall balance assessment: Needs assistance ?Sitting-balance support: Feet supported ?Sitting balance-Leahy Scale: Fair ?  ?  ?Standing balance support: Bilateral upper extremity supported, During functional activity, Reliant on assistive device for balance ?Standing balance-Leahy Scale: Poor ?  ?  ?  ?  ?  ?  ?  ?  ?  ?  ?  ?  ?   ? ?ADL either performed or assessed with clinical judgement  ? ?ADL Overall ADL's : Needs assistance/impaired ?  ?  ?Grooming: Dance movement psychotherapist;Wash/dry hands;Set up ?Grooming Details (indicate cue type and reason): sitting in recliner ?Upper Body Bathing: Supervision/ safety;Sitting ?Upper Body Bathing Details (indicate cue type and reason): washing face sitting supported in bedside chair ?Lower Body Bathing: Maximal assistance;Sit to/from stand ?  ?Upper Body Dressing : Moderate assistance ?Upper Body Dressing Details (indicate cue type and reason):  hospital gown ?Lower Body Dressing: Maximal assistance;Sit to/from stand ?Lower Body Dressing Details (indicate cue type and reason): min guard for gripper socks in sitting with max assist for sit to stand. ?  ?  ?  ?  ?   ?  ?  ?General ADL Comments: Pt on 5Ls trach collar at 28% with sats maintaining 88% or above throughout session.  HR ranging from just below 100 at rest, increasing up to 118 with standing during LB selfcare.  Pt unable to stand, squat slightly, and then wash her peri area without descending down into the bedside chair. ?  ? ? ?   ?   ?   ? ?Cognition Arousal/Alertness: Awake/alert ?Behavior During Therapy: [facility] for tasks assessed/performed ?Overall Cognitive Status: Impaired/Different from baseline ?Area of Impairment: Memory, Safety/judgement, Awareness, Attention, Following commands, Problem solving ?  ?  ?  ?  ?  ?  ?  ?  ?  ?Current Attention Level: Sustained ?Memory: Decreased short-term memory ?Following Commands: Follows multi-step commands inconsistently ?Safety/Judgement: Decreased awareness of safety, Decreased awareness of deficits ?  ?Problem Solving: Requires tactile cues, Requires verbal cues, Decreased initiation, Slow processing, Difficulty sequencing ?General Comments: Pt needs mod to max instructional cueing for sustained attention to bathing tasks.  Increased internal distractions at times with sequencing. ?  ?  ?   ?   ?   ?General Comments Attempted pt on room air with sats decr to 88% at rest; during activity pt on 28% trach collar  ? ? ?Pertinent Vitals/ Pain       Pain Assessment ?Pain Assessment: Faces ?Faces Pain Scale: Hurts a little bit ?Pain Location: chest ?Pain Descriptors / Indicators: Burning ?Pain Intervention(s): Limited activity within patient's tolerance, Monitored during session, Repositioned ? ?Home Living Family/patient expects to be discharged to:: Private residence ?Living Arrangements: Spouse/significant other ?Available Help at Discharge: Family;Available PRN/intermittently ?Type of Home: House ?Home Access: Stairs to enter ?Entrance Stairs-Number of Steps: 1 ?  ?Home Layout: One level ?  ?  ?Bathroom Shower/Tub: Tub/shower unit ?  ?Bathroom Toilet: Standard ?Bathroom  Accessibility: No ?  ?Home Equipment: Other (comment) (hemi walker) ?  ?Additional Comments: per chart review, pt unable to report ?  ? ?  ?   ? ?Frequency ? Min 2X/week  ? ? ? ? ?  ?Progress Toward Goals ? ?OT Goals(current goals can now be found in the care plan section) ? Progress towards OT goals: Progressing toward goals ? ?Acute Rehab OT Goals ?Patient Stated Goal: Pt agreeable to work in therapy, but no goal stated ?OT Goal Formulation: With patient ?Time For Goal Achievement: 08/06/21 ?Potential to Achieve Goals: Good  ?Plan Discharge plan remains appropriate;Frequency remains appropriate   ? ?   ?AM-PAC OT "6 Clicks" Daily Activity     ?Outcome Measure ? ? Help from another person eating meals?: A Little ?Help from another person taking care of personal grooming?: A Little ?Help from another person toileting, which includes using toliet, bedpan, or urinal?: A Lot ?Help from another person bathing (including washing, rinsing, drying)?: A Lot ?Help from another person to put on and taking off regular upper body clothing?: A Little ?Help from another person to put on and taking off regular lower body clothing?: A Lot ?6 Click Score: 15 ? ?  ?End of Session Equipment Utilized During Treatment: Oxygen ? ?OT Visit Diagnosis: Other abnormalities of gait and mobility (R26.89);Muscle weakness (generalized) (M62.81);Other symptoms and signs involving cognitive function ?  ?Activity Tolerance Patient tolerated  treatment well ?  ?Patient Left in chair;with call bell/phone within reach;with chair alarm set ?  ?Nurse Communication Mobility status ?  ? ?   ? ?Time: [id number] ?OT Time Calculation (min): 39 min ? ?Charges: OT General Charges ?$OT Visit: 1 Visit ?OT Treatments ?$Self Care/Home Management : 38-52 mins ? ?Lamarco Gudiel OTR/L ?07/28/2021, 11:19 AM ?

## 2021-07-28 NOTE — Progress Notes (Signed)
? ?                                                                                                                                                     ?                                                   ?Daily Progress Note  ? ?Patient Name: Gina Bradley       Date: 07/28/2021 ?DOB: 06-16-1960  Age: 61 y.o. MRN#: [id number] ?Attending Physician: Leroy Sea, MD ?Primary Care Physician: Elfredia Nevins, MD ?Admit Date: 06/22/2021 ? ?Reason for Consultation/Follow-up: Establishing goals of care ? ?Patient Profile/HPI:  61 y.o. female  with past medical history of DM-2, HTN, SVT, HFpEF, steroid-dependent COPD, opiate dependence, and recent compression fracture at L4/right inferior pubic rami fracture admitted on 06/22/2021 with AMS, shortness of breath, and SVT. She was diagnosed with septic shock due to pneumonia and required intubation.  Patient was unable to wean and required trach on March 28.  Patient now on trach collar.  Patient is not safe for p.o. intake and so continues to have core track in place.  Patient severely encephalopathic.  Medical team has had difficulty getting in touch with patient's family.  PMT consulted to discuss goals of care. ? ?Subjective: ?Chart reviewed including labs, progress notes. ?Iyonia is awake and alert.  ?She continues to have some confusion and short term memory loss. She does not recall my previous visits or our conversations.  ?We discussed HCPOA again.  I do believe she has capacity to select a surrogate decision  maker, however,  I am worried about her capacity should any complex medical decisions arise, but that would need to be evaluated on a case by case basis.  ?She continues to endorse that she would want Ricky to be her surrogate- but she does not want to complete documentation at this point.  ?She did not remember that social work is seeking placement in a nursing facility for her, but she is willing to go.  ? ?Review of Systems  ?Constitutional:  Positive for  malaise/fatigue.  ?Neurological:  Positive for weakness.  ? ? ?Physical Exam ?Vitals and nursing note reviewed.  ?Pulmonary:  ?   Effort: Pulmonary effort is normal.  ?Abdominal:  ?   General: Bowel sounds are normal.  ?   Palpations: Abdomen is soft.  ?Neurological:  ?   Mental Status: She is alert.  ?         ? ?Vital Signs: BP 128/71   Pulse 85   Temp 98.1 ?F (36.7 ?C) (Axillary)   Resp 15   Ht 5\' 5"  (1.651 m)  Wt 94.3 kg   LMP 01/05/2015   SpO2 97%   BMI 34.60 kg/m?  ?SpO2: SpO2: 97 % ?O2 Device: O2 Device: Tracheostomy Collar ?O2 Flow Rate: O2 Flow Rate (L/min): 5 L/min ? ?Intake/output summary:  ?Intake/Output Summary (Last 24 hours) at 07/28/2021 1432 ?Last data filed at 07/28/2021 [id number] ?Gross per 24 hour  ?Intake 11.54 ml  ?Output 200 ml  ?Net -188.46 ml  ? ? ?LBM: Last BM Date : 07/26/21 ?Baseline Weight: Weight: 100 kg ?Most recent weight: Weight: 94.3 kg ? ?     ?Palliative Assessment/Data: PPS: 30% ? ? ? ?Flowsheet Rows   ? ?Flowsheet Row Most Recent Value  ?Intake Tab   ?Referral Department Hospitalist  ?Unit at Time of Referral Intermediate Care Unit  ?Palliative Care Primary Diagnosis Pulmonary  ?Date Notified 07/17/21  ?Palliative Care Type New Palliative care  ?Reason for referral Clarify Goals of Care  ?Date of Admission 06/22/21  ?Date first seen by Palliative Care 07/17/21  ?# of days Palliative referral response time 0 Day(s)  ?# of days IP prior to Palliative referral 25  ?Clinical Assessment   ?Psychosocial & Spiritual Assessment   ?Palliative Care Outcomes   ? ?  ? ? ?Patient Active Problem List  ? Diagnosis Date Noted  ?? Sepsis ([place])   ?? Acute metabolic encephalopathy 99991111  ?? Opioid dependence ([place]) 06/23/2021  ?? Multifocal pneumonia 06/23/2021  ?? Obesity, Class III, BMI 40-49.9 (morbid obesity) ([place]) 06/23/2021  ?? Transaminasemia 06/23/2021  ?? Acute respiratory failure with hypoxia and hypercarbia ([place]) 06/22/2021  ?? Severe sepsis ([place]) 06/22/2021  ?? Right subacute  Inferior pubic ramus fracture 06/22/2021  ?? Endometrial thickening on CT scan 06/22/2021  ?? Chronic heart failure with preserved ejection fraction (HFpEF) ([place]) 06/22/2021  ?? Uncontrolled type 2 diabetes mellitus with hyperglycemia, without long-term current use of insulin ([place]) 08/18/2020  ?? Tick bite of left lower leg   ?? Peripheral edema 08/17/2020  ?? Hypokalemia   ?? Class 1 obesity due to excess calories with body mass index (BMI) of 33.0 to 33.9 in adult   ?? Cellulitis   ?? Acidosis, metabolic [id number]  ?? PMB (postmenopausal bleeding) 10/06/2016  ?? Chest pressure 01/05/2015  ?? Gastric erosion   ?? Encounter for screening colonoscopy 01/22/2014  ?? Constipation 01/20/2014  ?? GERD (gastroesophageal reflux disease) 01/20/2014  ?? Dyspepsia 01/20/2014  ?? PSVT (paroxysmal supraventricular tachycardia) ([place]) 05/31/2013  ?? HTN (hypertension), benign 05/31/2013  ?? Hypercholesteremia 05/31/2013  ? ? ?Palliative Care Assessment & Plan  ? ? ?Assessment/Recommendations/Plan ? ?Continue full scope full code ?GOC are to d/c to SNF and eventually home- full code status ?She does not wish to complete HCPOA- if future complex medical decisions are needed to be made- will need to follow [place] statute- if spouse is unreachable then her two children- [name] and [name] would be decision makers ?PMT will follow peripherally and readdress GOC if patient decompensates ? ?Code Status: ?Full code ? ?Prognosis: ? Unable to determine ? ?Discharge Planning: ?To Be Determined ? ?Care plan was discussed with patient ? ?Thank you for allowing the Palliative Medicine Team to assist in the care of this patient. ? ?Mariana Kaufman, AGNP-C ?Palliative Medicine ? ? ?Please contact Palliative Medicine Team phone at [phone] for questions and concerns.  ? ? ? ? ? ? ?

## 2021-07-29 DIAGNOSIS — J9602 Acute respiratory failure with hypercapnia: Secondary | ICD-10-CM | POA: Diagnosis not present

## 2021-07-29 DIAGNOSIS — J9601 Acute respiratory failure with hypoxia: Secondary | ICD-10-CM | POA: Diagnosis not present

## 2021-07-29 LAB — COMPREHENSIVE METABOLIC PANEL
ALT: 32 U/L (ref 0–44)
AST: 48 U/L — ABNORMAL HIGH (ref 15–41)
Albumin: 3 g/dL — ABNORMAL LOW (ref 3.5–5.0)
Alkaline Phosphatase: 93 U/L (ref 38–126)
Anion gap: 8 (ref 5–15)
BUN: 10 mg/dL (ref 6–20)
CO2: 29 mmol/L (ref 22–32)
Calcium: 9 mg/dL (ref 8.9–10.3)
Chloride: 96 mmol/L — ABNORMAL LOW (ref 98–111)
Creatinine, Ser: 0.67 mg/dL (ref 0.44–1.00)
GFR, Estimated: >60 mL/min (ref >60)
Glucose, Bld: 121 mg/dL — ABNORMAL HIGH (ref 70–99)
Potassium: 3.4 mmol/L — ABNORMAL LOW (ref 3.5–5.1)
Sodium: 133 mmol/L — ABNORMAL LOW (ref 135–145)
Total Bilirubin: 0.7 mg/dL (ref 0.3–1.2)
Total Protein: 6.4 g/dL — ABNORMAL LOW (ref 6.5–8.1)

## 2021-07-29 LAB — CBC WITH DIFFERENTIAL/PLATELET
Abs Immature Granulocytes: 0.2 10*3/uL — ABNORMAL HIGH (ref 0.00–0.07)
Basophils Absolute: 0.1 10*3/uL (ref 0.0–0.1)
Basophils Relative: 1 %
Eosinophils Absolute: 0.3 10*3/uL (ref 0.0–0.5)
Eosinophils Relative: 3 %
HCT: 31.2 % — ABNORMAL LOW (ref 36.0–46.0)
Hemoglobin: 9.8 g/dL — ABNORMAL LOW (ref 12.0–15.0)
Immature Granulocytes: 2 %
Lymphocytes Relative: 29 %
Lymphs Abs: 2.9 10*3/uL (ref 0.7–4.0)
MCH: 30.8 pg (ref 26.0–34.0)
MCHC: 31.4 g/dL (ref 30.0–36.0)
MCV: 98.1 fL (ref 80.0–100.0)
Monocytes Absolute: 1 10*3/uL (ref 0.1–1.0)
Monocytes Relative: 10 %
Neutro Abs: 5.6 10*3/uL (ref 1.7–7.7)
Neutrophils Relative %: 55 %
Platelets: 347 10*3/uL (ref 150–400)
RBC: 3.18 MIL/uL — ABNORMAL LOW (ref 3.87–5.11)
RDW: 17.4 % — ABNORMAL HIGH (ref 11.5–15.5)
WBC: 10 10*3/uL (ref 4.0–10.5)
nRBC: 0 % (ref 0.0–0.2)

## 2021-07-29 LAB — GLUCOSE, CAPILLARY
Glucose-Capillary: 121 mg/dL — ABNORMAL HIGH (ref 70–99)
Glucose-Capillary: 326 mg/dL — ABNORMAL HIGH (ref 70–99)
Glucose-Capillary: 216 mg/dL — ABNORMAL HIGH (ref 70–99)
Glucose-Capillary: 86 mg/dL (ref 70–99)

## 2021-07-29 MED ORDER — METOPROLOL TARTRATE 25 MG PO TABS
25.0000 mg | ORAL_TABLET | Freq: Two times a day (BID) | ORAL | Status: DC
  Administered 2021-07-29 – 2021-08-04 (×12): 25 mg via ORAL
  Filled 2021-07-29 (×14): qty 1

## 2021-07-29 MED ORDER — LACTATED RINGERS IV SOLN
INTRAVENOUS | Status: AC
  Filled 2021-07-29: qty 1000

## 2021-07-29 MED ORDER — POTASSIUM CHLORIDE CRYS ER 20 MEQ PO TBCR
20.0000 meq | EXTENDED_RELEASE_TABLET | Freq: Once | ORAL | Status: AC
Start: 2021-07-29 — End: 2021-07-29
  Administered 2021-07-29: 20 meq via ORAL
  Filled 2021-07-29: qty 1

## 2021-07-29 NOTE — TOC Progression Note (Signed)
Transition of Care (TOC) - Progression Note  ? ? ?Patient Details  ?Name: ALYRA PATTY ?MRN: [id number] ?Date of Birth: 03/28/1961 ? ?Transition of Care (TOC) CM/SW Contact  ?Mearl Latin, LCSW ?Phone Number: ?07/29/2021, 9:02 AM ? ?Clinical Narrative:    ?Patient continues with no SNF bed offers. CSW notes trach capping trials, which will allow patient offers. Will continue to follow. ? ? 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 ?Expected Discharge Plan and Services ?Expected Discharge Plan: Skilled Nursing Facility ?In-house Referral: Clinical Social Work ?Discharge Planning Services: CM Consult ?Post Acute Care Choice:  (LTACH) ?Living arrangements for the past 2 months: Single Family Home ?                ?  ?  ?  ?  ?  ?  ?  ?  ?  ?  ? ? ?Social Determinants of Health (SDOH) Interventions ?  ? ?Readmission Risk Interventions ?   ? View : No data to display.  ?  ?  ?  ? ? ?

## 2021-07-29 NOTE — Progress Notes (Addendum)
? ?NAME:  Gina Bradley, MRN:  [id number], DOB:  04-14-1960, LOS: 37 ?ADMISSION DATE:  06/22/2021, CONSULTATION DATE:  3/21 ?REFERRING MD:  Laural Benes, [place], CHIEF COMPLAINT:  acute resp failure/ fever wth AMS and SVT ? ?History of Present Illness:  ?61 y/o female former smoker presented to [facility] ER on 3/21 with AMS, fever.  Found to have multifocal pneumonia and required intubation and pressors.  Transferred to [facility] for further management. ? ?Pertinent Medical History:  ?Anemia, Back pain, Spinal stenosis COPD, DM type 2, HTN, GERD, CHF, Lymphedema, Neuropathy, SVT ? ?Significant Hospital Events: ?Including procedures, antibiotic start and stop dates in addition to other pertinent events   ?3/21 Admit to [facility], intubated, start pressors, cardiology consult for SVT, start amiodarone ?3/24 transfer to [facility] ?3/28 Trach for failure to wean ?3/31 Fever, hypotension, worsening leukocytosis. Started on levophed and antibiotics. ?4/2 PICC removed for line holiday; ID following ?4/4 off precedex;and fentanyl; ID plan to stop cefepime and vanc ?4/5 remains off precedex and fentanyl; failed PSV early in morning ?4/6 remains off continuous sedation; failed PSV this morning due to desaturation ?4/9 Tolerated being off the vent for about 8 hours yesterday 4/8 ?4/10 WBC elevated, copious thick yellow secretions, restless / agitated  ?4/17 #8 cuffed trach, 28% ATC. Planned trach change to #6 cuffless ?4/24 Tolerating ATC, PMV. ?4/26 capping trials ?4/27 trach removed.  ? ?Interim History / Subjective:  ?Passed capping trials  ? ?Objective:  ?Blood pressure (Abnormal) 104/41, pulse 84, temperature 98.3 ?F (36.8 ?C), temperature source Oral, resp. rate 16, height 5\' 5"  (1.651 m), weight 94.3 kg, last menstrual period 01/05/2015, SpO2 100 %. ?   ?FiO2 (%):  [28 %] 28 %  ? ?Intake/Output Summary (Last 24 hours) at 07/29/2021 1140 ?Last data filed at 07/28/2021 2045 ?Gross per 24 hour  ?Intake 88.43 ml  ?Output 600 ml  ?Net -511.57 ml   ? ? ?Filed Weights  ? 07/22/21 0500 07/24/21 0421 07/28/21 0500  ?Weight: 96.6 kg 95.8 kg 94.3 kg  ? ?Physical Examination: ?General this is a 61 year old female resting in bed ?HENT NCAT trach capped. Excellent phonation.  ?Pulm decreased bases. No accessory use  ?Card rrr ?Abd soft  ?Ext LE edema pulses are strong  ?Neuro intact  ? ?Resolved Problems:  ?Septic shock, Elevated troponin from demand ischemia ?Urinary retention ? ?Assessment & Plan:  ? ?Acute on Chronic Hypoxic / Hypercapnic Respiratory Failure secondary to Multifocal Aspiration PNA, Metapneumovirus, AECOPD, and Metabolic Encephalopathy s/p Tracheostomy  ?Trach placed 3/28 for failure to vent wean.  Downsized to #6 [place] cuffless 4/17. ?-Her speech quality and endurance is excellent w/ PMV.  ?-passed capping trial  ?Plan ?Keep occlusive dressing in place x 24-48 hrs then change daily until stoma closed ?No submerging under water until stoma closed ?May shower if stoma covered ?I reviewed splinting the stoma for cough and phonation w/ her ?Cont current BDs ?Cont pred 10 ?Will drop by tomorrow to check on her. Hopefully this should open up rehab options.  ?Stoma should close in about 10d. If not would need ENT eval for tracheocutaneous fistula  ? ?Best Practice (right click and "Reselect all SmartList Selections" daily)  ?Per primary  ? ?Signature:  ? ?5/17 ACNP-BC ?[facility] ?Pager # [phone] OR # [phone] if no answer ? ? ?PCCM: ? ?Trach stable. Doing well with capping trial  ? ?BP 118/80 (BP Location: Right Arm)   Pulse 93   Temp 98.6 ?F (37 ?C) (Oral)  Resp 16   Ht 5\' 5"  (1.651 m)   Wt 94.3 kg   LMP 01/05/2015   SpO2 100%   BMI 34.60 kg/m?   ?Gen: comfortable  ?HENT: trach, capped, breathing well  ?Heart: RRR< s1 s2  ?Lungs: CTAB ? ?A: ?Chronic resp failure  ?AECOPD, metapneumovirus, asp pna ? ?P: ?Remove trach today  ?Stoma should close over the next 7-10 days  ?If she doesn't will need ENT to close.   ? ?03/07/2015, DO ?[facility] ?07/29/2021 3:27 PM   ? ? ?

## 2021-07-29 NOTE — Progress Notes (Signed)
?      ?                 PROGRESS NOTE ? ?      ?PATIENT DETAILS ?Name: Gina Bradley ?Age: 61 y.o. ?Sex: female ?Date of Birth: 17-Oct-1960 ?Admit Date: 06/22/2021 ?Admitting Physician Rolla Plate, DO ?[id number], Purcell Nails, MD ? ?Brief Summary: ?Patient is a 62 y.o.  female with history of DM-2, HTN, SVT, HFpEF, steroid-dependent COPD, opiate dependence, recent compression fracture at L4/right inferior pubic rami fracture-who presented to [facility] ED with altered mental status, SOB with hypoxia, SVT-she was thought to have septic shock due to PNA-she was subsequently intubated-started on broad-spectrum antibiotics/pressors and managed in the ICU.  She was subsequently transferred to [facility] ICU-she was stabilized-however she had failure to wean and underwent a tracheostomy on 3/28.  While in the ICU-she developed fever requiring ID consultation-ICU delirium/encephalopathy requiring on and off infusion of Precedex.  After stability-she was transferred to [facility] on 4/11-where further hospital course was complicated by persistent encephalopathy and MSSA PNA (sputum cultures on 4/10).  She was managed with supportive care-with significant clearing of her encephalopathy.  See below for further details.   ? ?Significant events: ?3/21>> Admit to [facility], intubated, start pressors, cardiology consult for SVT, start amiodarone ?3/24>> transfer to [facility] ?3/28>> Trach for failure to wean ?4/11>> transfer to [facility] ?4/17>> trach downsized to #6 cuffless ?4/18>> SLP reeval and MBS completed-dysphagia 1 diet started. ?4/19>> eating around 40% of some of her meals-at times able to feed herself. ?4/20>> Cortak tube removed ?4/21>> completely awake/alert ? ?Significant studies: ?3/21>> CT head: No acute abnormalities. ?3/21>> CTA chest: No PE, multifocal pneumonia.  Multiple subacute/chronic rib fractures. ?3/21>> CT abdomen/pelvis: No acute finding-thickened endometrium. ?3/23>> RUQ ultrasound: No gallstones ?3/25>> Echo: EF 65-70% ?3/31>> MRI  brain: No acute intracranial abnormality. ?4/01>> CT chest: Extensive multifocal patchy airspace consolidations. ?4/12>> EEG: No seizures. ? ?Significant microbiology data: ?3/21>> COVID/influenza PCR: Negative ?3/21>> blood culture: No growth ?3/21>> urine culture: No growth ?3/22>> tracheal aspirate: No growth ?3/23>> urine culture: 1000 colonies/mL of Staph aureus ?3/23>> blood culture: Staph hominis (contamination) ?3/27>> tracheal aspirate: No growth ?3/31>> blood culture 1/2: Staph epidermidis (contamination) ?4/01>> tracheal aspirate: No growth ?4/03>> blood culture: No growth ?4/05>> respiratory virus panel: Meta pneumonia virus ?4/10>> tracheal aspirate: MSSA ? ?Antimicrobial therapy: ?Doxycyline 3/22 >> 3/24 ?Ceftriaxone 3/21 >>3/25 ?Azithromycin 3/21 x1 dose ?Vancomycin 3/24 >>3/25; 4/1>> 4/4, 4/10 >>4/13 ?Zosyn 3/25 >> 3/28 ?Cefepime 3/31>> 4/4 ?Diflucan 4/1>> 4/4 ?Cefazolin 4/13>> 4/19. ? ?Procedures: ?3/28>> tracheostomy ? ?Consults: ?ID, PCCM, cardiology ? ?Subjective: Patient in bed, appears comfortable, denies any headache, no fever, no chest pain or pressure, no shortness of breath , no abdominal pain. No focal weakness. ? ?Objective: ?Vitals: ?Blood pressure (!) 104/41, pulse 84, temperature 98.3 ?F (36.8 ?C), temperature source Oral, resp. rate 16, height 5\' 5"  (1.651 m), weight 94.3 kg, last menstrual period 01/05/2015, SpO2 100 %.  ? ?Exam: ? ?Awake Alert, No new F.N deficits, Normal affect ?[place].AT,PERRAL ?Supple Neck, tracheostomy capped ?Symmetrical Chest wall movement, Good air movement bilaterally, CTAB ?RRR,No Gallops, Rubs or new Murmurs,  ?+ve B.Sounds, Abd Soft, No tenderness,   ?No Cyanosis, Clubbing or edema  ? ? ? ?Assessment/Plan: ? ?Septic shock, acute on chronic hypoxic and hypercapnic respiratory failure due to multifocal aspiration pneumonia requiring tracheostomy in ICU : Sepsis physiology has resolved-clinically improved-leukocytosis still persists but has now started to  downtrend.  Chest pathophysiology has now resolved, she has completed her antibiotic  treatment.  She also had viral infection with metapneumovirus.  She is currently on #6 cuffless trach with trach collar.  Clinically improving.  Continue to monitor.  PCCM managing trach site, DW PCCM 07/28/21 if stable they may try to decannulate her. ? ?Acute metabolic encephalopathy: Likely multifactorial etiology-due to PNA/ICU delirium/sepsis/polypharmacy.  Deficits much improved with supportive.  ? ?Dysphagia: Due to severe encephalopathy/recent trach-had a cortak tube in place-but with significant improvement in encephalopathy-cortak tubewas discontinued on 4/20.  Per nursing staff-significant improvement in her appetite.  Speech following on oral diet. ? ?Asymptomatic transaminitis: Unclear significance-could have been from sepsis-improved-RUQ ultrasound was without acute abnormalities. ? ?COPD: Continue bronchodilators-on chronic prednisone. ? ?SVT: unsuccessful cardioversion on initial presentation to the ED-subsequently required amiodarone infusion-currently maintaining sinus rhythm-on PO Lopressor dose increased 07/28/21. ? ?Acute on chronic HFpEF,  EF preserved at 65%: Volume status stable-continue Aldactone - PRN IV Lasix. ? ?Bilateral extremity lymphedema: Continues to have significant lower extremity edema- K rising - DC Aldactone - as required several doses of as needed IV Lasix. ? ?HTN: BP stable-continue Lopressor. ? ?HLD: Continue statin ?  ?GERD: Continue PPI ? ?Normocytic anemia: Due to acute illness-no evidence of blood loss-transfuse if Hb <7 ? ?Endometrial thickening on CT abdomen: Will require outpatient follow-up with GYN. ? ?Opiate dependence: Continue oxycodone-dosage has been gradually decreased to 10 mg every 8 hours.  She is on chronic narcotics at home.  ? ?Recent right pubic rami fracture: Supportive care at this point. ? ?Hypokalemia.  Replaced.   ? ?Constipation.  Resolved after bowel regimen  continue. ? ?Palliative care: She wishes to be full code decisions to be made by husband according to the patient. ? ?Chronic pain.  On home dose narcotics.  Avoid overdose.  In no distress whatsoever.  Some narcotic seeking behavior  ? ?DM-2 (A1c 7.5 on 3/22): CBGs relatively stable-continue SSI ? ?CBG (last 3)  ?Recent Labs  ?  07/28/21 ?1706 07/28/21 ?2041 07/29/21 ?0900  ?GLUCAP 189* 181* 121*  ? ? ? ?Nutrition Status: ?Nutrition Problem: Inadequate oral intake ?Etiology: inability to eat ?Signs/Symptoms: NPO status ?Interventions: Ensure Enlive (each supplement provides 350kcal and 20 grams of protein), MVI ? ?Obesity ?Estimated body mass index is 34.6 kg/m? as calculated from the following: ?  Height as of this encounter: 5\' 5"  (1.651 m). ?  Weight as of this encounter: 94.3 kg.  ? ?Code status: ?  Code Status: Full Code  ? ?DVT Prophylaxis: Prophylactic Lovenox ? ?Family Communication: Spouse-[phone] called-no response on 4/21. ? ?Disposition Plan: ?Status is: Inpatient ?Remains inpatient appropriate because: Resolving septic shock-encephalopathy-plan is now for SNF-but per social work-can only be placed in SNF 1 month after trach placed.   ?  ?Planned Discharge Destination: SNF. ? ? ?Diet: ?Diet Order   ? ?       ?  DIET DYS 3 Room service appropriate? Yes with Assist; Fluid consistency: Thin  Diet effective now       ?  ? ?  ?  ? ?  ?  ?MEDICATIONS: ?Scheduled Meds: ? arformoterol  15 mcg Nebulization BID  ? aspirin  81 mg Oral Daily  ? atorvastatin  40 mg Oral Daily  ? budesonide (PULMICORT) nebulizer solution  0.5 mg Nebulization BID  ? chlorhexidine  15 mL Mouth Rinse BID  ? Chlorhexidine Gluconate Cloth  6 each Topical Q0600  ? clonazepam  0.25 mg Oral BID  ? docusate sodium  200 mg Oral BID  ? DULoxetine  20 mg Oral  Daily  ? enoxaparin (LOVENOX) injection  50 mg Subcutaneous Q24H  ? feeding supplement  237 mL Oral BID BM  ? gabapentin  300 mg Oral Q8H  ? guaiFENesin  30 mL Oral Q6H  ? insulin  aspart  0-9 Units Subcutaneous TID WC  ? mouth rinse  15 mL Mouth Rinse q12n4p  ? metoprolol tartrate  25 mg Oral BID  ? multivitamin with minerals  1 tablet Oral Daily  ? pantoprazole sodium  40 mg Oral Daily

## 2021-07-29 NOTE — Procedures (Addendum)
Trach removed  ?Occlusive dressing placed ?Instructed pt on stoma splinting ? ?Simonne Martinet ACNP-BC ?[facility] ?Pager # [phone] OR # [phone] if no answer ? ?

## 2021-07-29 NOTE — Progress Notes (Signed)
Pt was decannulated by Anders Simmonds NP without any complications. Mepilex was placed over stoma site. Pt is tolerating well at this time with appropriate vitals. No respiratory distress noted. Rt will monitor.  ?

## 2021-07-30 DIAGNOSIS — J9601 Acute respiratory failure with hypoxia: Secondary | ICD-10-CM | POA: Diagnosis not present

## 2021-07-30 DIAGNOSIS — J9602 Acute respiratory failure with hypercapnia: Secondary | ICD-10-CM | POA: Diagnosis not present

## 2021-07-30 LAB — CBC WITH DIFFERENTIAL/PLATELET
Abs Immature Granulocytes: 0.15 10*3/uL — ABNORMAL HIGH (ref 0.00–0.07)
Basophils Absolute: 0.1 10*3/uL (ref 0.0–0.1)
Basophils Relative: 1 %
Eosinophils Absolute: 0.4 10*3/uL (ref 0.0–0.5)
Eosinophils Relative: 4 %
HCT: 30.2 % — ABNORMAL LOW (ref 36.0–46.0)
Hemoglobin: 9.5 g/dL — ABNORMAL LOW (ref 12.0–15.0)
Immature Granulocytes: 2 %
Lymphocytes Relative: 30 %
Lymphs Abs: 2.7 10*3/uL (ref 0.7–4.0)
MCH: 30.6 pg (ref 26.0–34.0)
MCHC: 31.5 g/dL (ref 30.0–36.0)
MCV: 97.4 fL (ref 80.0–100.0)
Monocytes Absolute: 1.1 10*3/uL — ABNORMAL HIGH (ref 0.1–1.0)
Monocytes Relative: 12 %
Neutro Abs: 4.7 10*3/uL (ref 1.7–7.7)
Neutrophils Relative %: 51 %
Platelets: 327 10*3/uL (ref 150–400)
RBC: 3.1 MIL/uL — ABNORMAL LOW (ref 3.87–5.11)
RDW: 17.4 % — ABNORMAL HIGH (ref 11.5–15.5)
WBC: 9 10*3/uL (ref 4.0–10.5)
nRBC: 0 % (ref 0.0–0.2)

## 2021-07-30 LAB — GLUCOSE, CAPILLARY
Glucose-Capillary: 99 mg/dL (ref 70–99)
Glucose-Capillary: 232 mg/dL — ABNORMAL HIGH (ref 70–99)
Glucose-Capillary: 155 mg/dL — ABNORMAL HIGH (ref 70–99)
Glucose-Capillary: 120 mg/dL — ABNORMAL HIGH (ref 70–99)

## 2021-07-30 LAB — BRAIN NATRIURETIC PEPTIDE: B Natriuretic Peptide: 23.9 pg/mL (ref 0.0–100.0)

## 2021-07-30 LAB — COMPREHENSIVE METABOLIC PANEL
ALT: 32 U/L (ref 0–44)
AST: 50 U/L — ABNORMAL HIGH (ref 15–41)
Albumin: 2.8 g/dL — ABNORMAL LOW (ref 3.5–5.0)
Alkaline Phosphatase: 88 U/L (ref 38–126)
Anion gap: 6 (ref 5–15)
BUN: 8 mg/dL (ref 6–20)
CO2: 30 mmol/L (ref 22–32)
Calcium: 9.2 mg/dL (ref 8.9–10.3)
Chloride: 99 mmol/L (ref 98–111)
Creatinine, Ser: 0.71 mg/dL (ref 0.44–1.00)
GFR, Estimated: >60 mL/min (ref >60)
Glucose, Bld: 123 mg/dL — ABNORMAL HIGH (ref 70–99)
Potassium: 3.9 mmol/L (ref 3.5–5.1)
Sodium: 135 mmol/L (ref 135–145)
Total Bilirubin: 0.6 mg/dL (ref 0.3–1.2)
Total Protein: 5.8 g/dL — ABNORMAL LOW (ref 6.5–8.1)

## 2021-07-30 LAB — MAGNESIUM: Magnesium: 1.8 mg/dL (ref 1.7–2.4)

## 2021-07-30 NOTE — TOC Progression Note (Signed)
Transition of Care (TOC) - Progression Note  ? ? ?Patient Details  ?Name: Gina Bradley ?MRN: [id number] ?Date of Birth: January 21, 1961 ? ?Transition of Care (TOC) CM/SW Contact  ?Mearl Latin, LCSW ?Phone Number: ?07/30/2021, 8:49 AM ? ?Clinical Narrative:    ?Requested [facility] and [facility] review referral as they would be closer to patient's home. ? ? 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 ?Expected Discharge Plan and Services ?Expected Discharge Plan: Skilled Nursing Facility ?In-house Referral: Clinical Social Work ?Discharge Planning Services: CM Consult ?Post Acute Care Choice:  (LTACH) ?Living arrangements for the past 2 months: Single Family Home ?                ?  ?  ?  ?  ?  ?  ?  ?  ?  ?  ? ? ?Social Determinants of Health (SDOH) Interventions ?  ? ?Readmission Risk Interventions ?   ? View : No data to display.  ?  ?  ?  ? ? ?

## 2021-07-30 NOTE — TOC Initial Note (Addendum)
Transition of Care (TOC) - Initial/Assessment Note  ? ? ?Patient Details  ?Name: Gina Bradley ?MRN: [id number] ?Date of Birth: April 30, 1960 ? ?Transition of Care (TOC) CM/SW Contact:    ?Benard Halsted, LCSW ?Phone Number: ?07/30/2021, 5:19 PM ? ?Clinical Narrative:                 ?CSW spoke with patient who was oriented x4. She reported being grateful for CSW's help getting to rehab as she feels very weak. She is hoping for a facility in [place] or [place]. CSW made her aware that [facility] will review her on Monday once it has been a few days since her trach removal. No response from [facility]. Chamita stated they required 30 days from removal time. CSW provided supportive listening as patient lamented her husband's absence and lack of support. She is concerned he will use her debit card while she is in rehab. CSW encouraged her to see if her daughter could go to the patient's house (which patient stated she owns) and get her purse for her but patient stated she didn't want her daughter around any kind of narcotics as she is in recovery. CSW alternatively suggested involving police or the bank if she is very concerned. CSW provided supportive listening. She gave CSW permission to contact her daughter for decision making if patient becomes confused again. No need for APS report to be made at this time as patient states she feels safe.  ? ?Skilled Nursing Rehab Facilities-   [email or web address]   Ratings out of 5 possible   ?Name Address  Phone # Quality Care Staffing Health Inspection Overall  ?[facility] [address] [place] 4 5 2 3   ?[place], [place] [phone] 3 2 5 5   ?[facility] [place] 3 1 1 1   ?[place] [address] [place] 3 2 4 4   ?[facility] [address] [place] 1 1 2 1   ?[place] [phone] 2 1 4 3   ?[place] 5 2 3 4   ?[facility] [address] [place] 5 2 2 3   ?[address] ([place]) [address] [place] [phone] 5 1 2 2   ?[facility] [address], [place] [phone] 4 1 2 1   ?[facility] [address], [place] [phone] 4 1 2 1   ?[facility] ([place]) [place], [place] [phone] 4 1 1 1   ?Dustin Flock [address] [place] [phone] 3 2 4 4   ?        ?[place], [place]      ?[facility] [place] 4 2 3 3   ?[facility] [place], [place] 4 1 5 4   ?[place] [address] [place] [address], [place] [phone] 2 1 1 1   ?[facility] [address], [place] [phone] 2 1 3 2   ?        ?[address] (no [facility]) [address], [place] [phone] 4 5 5 5   ?[place] (No Hum[employer]) [address], [place] [phone] 3 1 4 3   ?[place] (No UHC) [address] [place] [phone] 5 5 5 5   ?[facility] [address], [employer] [phone] 3 2 4 4   ?[place]  [address] [place] 1 1 2 1   ?[place] [address] [place] [phone] 2 1 1 1   ?[facility] [place], [place] 5 2 4 5   ?[facility] [address], [place] 3 1 1 1   ?[facility] [place], [place] 2 1 2 1   ?        ?[facility] [place] 1 1 1 1   ?[place] [address], [place]  [phone] 2 4 2 2   ?[place] [address], [place] [phone] 5 2 3 4   ?[place] [address], [place] 2 1 1 1   ?[place] (No Humana) [address] [place] 2 1 3 2    ?[facility] [address], [place] [phone] 3 1 1 1   ?        ?[facility] [place], [place] 5 4 5 5   ?[facility] [place])  [address], [place] 2 2 3 3   ?[facility]) [place], [place] 3 2 4 4   ?[place] [address], [place] 4 3 4 4   ?[address] [place] 3 3 1 1   ?[facility]) [address] [place] [phone] 2 2 4 4   ? ? ? ?Expected Discharge Plan: [place] ?Barriers to Discharge: Ship broker, SNF Pending bed offer, Family Issues Systems developer) ? ? ?Patient Goals and CMS Choice ?Patient states their goals for this hospitalization and ongoing recovery are:: Rehab then home ?CMS Medicare.gov Compare Post Acute Care list provided to:: Patient ?Choice offered to / list presented to : Patient ? ?Expected Discharge Plan and Services ?Expected Discharge Plan: [place] ?In-house Referral: Clinical Social Work ?Discharge Planning Services: CM Consult ?Post Acute Care Choice: [place] ?Living arrangements for the past 2 months: [place] ?                ?  ?  ?  ?  ?  ?  ?  ?  ?  ?  ? ?Prior Living Arrangements/Services ?Living arrangements for the past 2 months: [place] ?Lives with:: Spouse ?Patient language and need for interpreter reviewed:: Yes ?Do you feel safe going back to the place where you live?: Yes      ?Need for Family Participation in Patient Care: Yes (Comment) ?Care giver support system in place?: Yes (comment) ?  ?Criminal Activity/Legal Involvement Pertinent to Current Situation/Hospitalization: No - Comment as needed ? ?Activities of Daily Living ?  ?  ? ?Permission Sought/Granted ?Permission sought to share information with : Case Manager, Customer service manager, Family Supports ?Permission granted to share  information with : Yes, Verbal Permission Granted ?   ? Permission granted to share info w AGENCY: SNFs ?   ?   ? ?Emotional Assessment ?Appearance:: Appears stated age ?Attitude/Demeanor/Rapport: Engaged ?Affect (typically observed): Appropriate, Accepting ?Orientation: : Oriented to Self, Oriented to Place, Oriented to  Time, Oriented to Situation ?Alcohol / Substance Use: Not Applicable ?Psych Involvement: No (comment) ? ?Admission diagnosis:  Acute respiratory failure with hypoxia ([place]) [J96.01] ?Multifocal pneumonia [J18.9] ?Sepsis, due to unspecified organism, unspecified whether acute organ dysfunction present ([place]) [A41.9] ?Patient Active Problem List  ? Diagnosis Date Noted  ? Sepsis ([place])   ? Acute metabolic encephalopathy 99991111  ? Opioid dependence ([place]) 06/23/2021  ?  Multifocal pneumonia 06/23/2021  ? Obesity, Class III, BMI 40-49.9 (morbid obesity) ([place]) 06/23/2021  ? Transaminasemia 06/23/2021  ? Acute respiratory failure with hypoxia and hypercarbia ([place]) 06/22/2021  ? Severe sepsis ([place]) 06/22/2021  ? Right subacute Inferior pubic ramus fracture 06/22/2021  ? Endometrial thickening on CT scan 06/22/2021  ? Chronic heart failure with preserved ejection fraction (HFpEF) ([place]) 06/22/2021  ? Uncontrolled type 2 diabetes mellitus with hyperglycemia, without long-term current use of insulin ([place]) 08/18/2020  ? Tick bite of left lower leg   ? Peripheral edema 08/17/2020  ? Hypokalemia   ? Class 1 obesity due to excess calories with body mass index (BMI) of 33.0 to 33.9 in adult   ? Cellulitis   ? Acidosis, metabolic [id number]  ? PMB (postmenopausal bleeding) 10/06/2016  ? Chest pressure 01/05/2015  ? Gastric erosion   ? Encounter for screening colonoscopy 01/22/2014  ? Constipation 01/20/2014  ? GERD (gastroesophageal reflux disease) 01/20/2014  ? Dyspepsia 01/20/2014  ? PSVT (paroxysmal supraventricular tachycardia) ([place]) 05/31/2013  ? HTN (hypertension), benign 05/31/2013  ? Hypercholesteremia  05/31/2013  ? ?PCP:  Redmond School, MD ?Pharmacy:   ?[place] [place] [address] ?[place] [address] ?Phone: [phone] Fax: [phone] ? ? ? ? ?Socia

## 2021-07-30 NOTE — [place] FL2 (Signed)
?[place] MEDICAID FL2 LEVEL OF CARE SCREENING TOOL  ?  ? ?IDENTIFICATION  ?Patient Name: ?Gina Bradley Birthdate: 1960-09-14 Sex: female Admission Date (Current Location): ?06/22/2021  ?[place] and [place] Number: ? [place] ?  Facility and Address:  ?[facility], [place] [address] [place], [place] [address] ?     Provider Number: ?[id number]  ?Attending Physician Name and Address:  ?Thurnell Lose, MD ? Relative Name and Phone Number:  ?  ?   ?Current Level of Care: ?Hospital Recommended Level of Care: ?[place] Prior Approval Number: ?  ? ?Date Approved/Denied: ?  PASRR Number: ?[id number] ? ?Discharge Plan: ?SNF ?  ? ?Current Diagnoses: ?Patient Active Problem List  ? Diagnosis Date Noted  ? Sepsis ([place])   ? Acute metabolic encephalopathy 99991111  ? Opioid dependence ([place]) 06/23/2021  ? Multifocal pneumonia 06/23/2021  ? Obesity, Class III, BMI 40-49.9 (morbid obesity) ([place]) 06/23/2021  ? Transaminasemia 06/23/2021  ? Acute respiratory failure with hypoxia and hypercarbia ([place]) 06/22/2021  ? Severe sepsis ([place]) 06/22/2021  ? Right subacute Inferior pubic ramus fracture 06/22/2021  ? Endometrial thickening on CT scan 06/22/2021  ? Chronic heart failure with preserved ejection fraction (HFpEF) ([place]) 06/22/2021  ? Uncontrolled type 2 diabetes mellitus with hyperglycemia, without long-term current use of insulin ([place]) 08/18/2020  ? Tick bite of left lower leg   ? Peripheral edema 08/17/2020  ? Hypokalemia   ? Class 1 obesity due to excess calories with body mass index (BMI) of 33.0 to 33.9 in adult   ? Cellulitis   ? Acidosis, metabolic [id number]  ? PMB (postmenopausal bleeding) 10/06/2016  ? Chest pressure 01/05/2015  ? Gastric erosion   ? Encounter for screening colonoscopy 01/22/2014  ? Constipation 01/20/2014  ? GERD (gastroesophageal reflux disease) 01/20/2014  ? Dyspepsia 01/20/2014  ? PSVT (paroxysmal supraventricular tachycardia) ([place]) 05/31/2013  ? HTN  (hypertension), benign 05/31/2013  ? Hypercholesteremia 05/31/2013  ? ? ?Orientation RESPIRATION BLADDER Height & Weight   ?  ?Self, Time, Situation, Place ? O2 (Nasal cannula 2L; (trach was removed 4/27)) Incontinent, External catheter Weight: 207 lb 14.3 oz (94.3 kg) ?Height:  5\' 5"  (165.1 cm)  ?BEHAVIORAL SYMPTOMS/MOOD NEUROLOGICAL BOWEL NUTRITION STATUS  ?    Incontinent Diet (See dc summary)  ?AMBULATORY STATUS COMMUNICATION OF NEEDS Skin   ?Limited Assist Verbally Normal ?  ?  ?  ?    ?     ?     ? ? ?Personal Care Assistance Level of Assistance  ?Bathing, Feeding, Dressing Bathing Assistance: Limited assistance ?Feeding assistance: Limited assistance ?Dressing Assistance: Limited assistance ?   ? ?Functional Limitations Info  ?Sight Sight Info: Impaired ?  ?   ? ? ?SPECIAL CARE FACTORS FREQUENCY  ?PT (By licensed PT), OT (By licensed OT)   ?  ?PT Frequency: 5x/week ?OT Frequency: 5x/week ?  ?  ?  ?   ? ? ?Contractures Contractures Info: Not present  ? ? ?Additional Factors Info  ?Code Status, Allergies, Psychotropic, Insulin Sliding Scale Code Status Info: Full ?Allergies Info: Bee Venom, Tdap (Tetanus-diphth-acell Pertussis) ?Psychotropic Info: Seroquel ?Insulin Sliding Scale Info: See dc summary ?  ?   ? ?Current Medications (07/30/2021):  This is the current hospital active medication list ?Current Facility-Administered Medications  ?Medication Dose Route Frequency Provider Last Rate Last Admin  ? 0.9 %  sodium chloride infusion   Intravenous PRN Jacky Kindle, MD 10 mL/hr at 07/16/21 0400 Infusion Verify at 07/16/21 0400  ?  acetaminophen (TYLENOL) tablet 650 mg  650 mg Oral Q4H PRN Leroy Sea, MD   650 mg at 07/30/21 0046  ? albuterol (PROVENTIL) (2.5 MG/3ML) 0.083% nebulizer solution 2.5 mg  2.5 mg Nebulization Q4H PRN Coralyn Helling, MD   2.5 mg at 07/22/21 1627  ? arformoterol (BROVANA) nebulizer solution 15 mcg  15 mcg Nebulization BID Coralyn Helling, MD   15 mcg at 07/30/21 4944  ? aspirin chewable  tablet 81 mg  81 mg Oral Daily Leroy Sea, MD   81 mg at 07/29/21 9675  ? atorvastatin (LIPITOR) tablet 40 mg  40 mg Oral Daily Leroy Sea, MD   40 mg at 07/29/21 0949  ? budesonide (PULMICORT) nebulizer solution 0.5 mg  0.5 mg Nebulization BID Canary Brim L, NP   0.5 mg at 07/30/21 9163  ? calcium carbonate (TUMS - dosed in mg elemental calcium) chewable tablet 200 mg of elemental calcium  1 tablet Oral TID PRN Barbara Cower, NP   200 mg of elemental calcium at 07/30/21 0203  ? chlorhexidine (PERIDEX) 0.12 % solution 15 mL  15 mL Mouth Rinse BID Maretta Bees, MD   15 mL at 07/29/21 2158  ? Chlorhexidine Gluconate Cloth 2 % PADS 6 each  6 each Topical Q0600 Cleora Fleet, MD   6 each at 07/28/21 1014  ? clonazePAM (KLONOPIN) disintegrating tablet 0.25 mg  0.25 mg Oral BID Susa Raring K, MD   0.25 mg at 07/29/21 2159  ? diltiazem (CARDIZEM) injection 10 mg  10 mg Intravenous Q6H PRN Leroy Sea, MD      ? docusate sodium (COLACE) capsule 200 mg  200 mg Oral BID Leroy Sea, MD   200 mg at 07/29/21 2200  ? DULoxetine (CYMBALTA) DR capsule 20 mg  20 mg Oral Daily Leroy Sea, MD   20 mg at 07/29/21 0950  ? enoxaparin (LOVENOX) injection 50 mg  50 mg Subcutaneous Q24H Cheri Fowler, MD   50 mg at 07/29/21 0949  ? feeding supplement (ENSURE ENLIVE / ENSURE PLUS) liquid 237 mL  237 mL Oral BID BM Leroy Sea, MD   237 mL at 07/29/21 1437  ? gabapentin (NEURONTIN) capsule 300 mg  300 mg Oral Q8H Leroy Sea, MD   300 mg at 07/30/21 8466  ? guaiFENesin (ROBITUSSIN) 100 MG/5ML liquid 30 mL  30 mL Oral Q6H Leroy Sea, MD   30 mL at 07/30/21 5993  ? haloperidol lactate (HALDOL) injection 2 mg  2 mg Intravenous Q6H PRN Leroy Sea, MD   2 mg at 07/28/21 1714  ? hydrALAZINE (APRESOLINE) injection 10 mg  10 mg Intravenous Q4H PRN Karl Ito, MD   10 mg at 06/30/21 0910  ? insulin aspart (novoLOG) injection 0-9 Units  0-9 Units Subcutaneous TID WC  Howerter, Justin B, DO   3 Units at 07/29/21 1646  ? MEDLINE mouth rinse  15 mL Mouth Rinse q12n4p Maretta Bees, MD   15 mL at 07/29/21 1647  ? metoprolol tartrate (LOPRESSOR) tablet 25 mg  25 mg Oral BID Leroy Sea, MD   25 mg at 07/29/21 2209  ? multivitamin with minerals tablet 1 tablet  1 tablet Oral Daily Leroy Sea, MD   1 tablet at 07/29/21 0948  ? naloxone [facility]) injection 0.4 mg  0.4 mg Intravenous PRN Leroy Sea, MD      ? nitroGLYCERIN (NITROSTAT) SL tablet 0.4 mg  0.4  mg Sublingual Q30 min PRN Howerter, Justin B, DO      ? ondansetron (ZOFRAN) injection 4 mg  4 mg Intravenous Q6H PRN Wynetta Emery, Clanford L, MD   4 mg at 07/27/21 1803  ? oxyCODONE (Oxy IR/ROXICODONE) immediate release tablet 10 mg  10 mg Oral QID PRN Thurnell Lose, MD   10 mg at 07/30/21 0158  ? pantoprazole sodium (PROTONIX) 40 mg/20 mL oral suspension 40 mg  40 mg Oral Daily Thurnell Lose, MD   40 mg at 07/29/21 0948  ? polyethylene glycol (MIRALAX / GLYCOLAX) packet 17 g  17 g Oral BID Thurnell Lose, MD   17 g at 07/29/21 2203  ? predniSONE (DELTASONE) tablet 5 mg  5 mg Oral Q breakfast Thurnell Lose, MD   5 mg at 07/29/21 0949  ? primidone (MYSOLINE) tablet 50 mg  50 mg Oral QHS Thurnell Lose, MD   50 mg at 07/28/21 2144  ? QUEtiapine (SEROQUEL) tablet 100 mg  100 mg Oral BID Thurnell Lose, MD   100 mg at 07/29/21 2158  ? revefenacin (YUPELRI) nebulizer solution 175 mcg  175 mcg Nebulization Daily Chesley Mires, MD   175 mcg at 07/30/21 [id number]  ? sodium chloride flush (NS) 0.9 % injection 10-40 mL  10-40 mL Intracatheter PRN Jonetta Osgood, MD   10 mL at 07/24/21 2217  ? ? ? ?Discharge Medications: ?Please see discharge summary for a list of discharge medications. ? ?Relevant Imaging Results: ? ?Relevant Lab Results: ? ? ?Additional Information ?SSN: [id number]. ? ?Benard Halsted, LCSW ? ? ? ? ?

## 2021-07-30 NOTE — Progress Notes (Signed)
?      ?                 PROGRESS NOTE ? ?      ?PATIENT DETAILS ?Name: Gina Bradley ?Age: 61 y.o. ?Sex: female ?Date of Birth: [birth year] ?Admit Date: 06/22/2021 ?Admitting Physician Lilyan [place], DO ?[id number], Lyman Bishop, MD ? ?Brief Summary: ?Patient is a 61 y.o.  female with history of DM-2, HTN, SVT, HFpEF, steroid-dependent COPD, opiate dependence, recent compression fracture at L4/right inferior pubic rami fracture-who presented to [facility] ED with altered mental status, SOB with hypoxia, SVT-she was thought to have septic shock due to PNA-she was subsequently intubated-started on broad-spectrum antibiotics/pressors and managed in the ICU.  She was subsequently transferred to [facility] ICU-she was stabilized-however she had failure to wean and underwent a tracheostomy on 3/28.  While in the ICU-she developed fever requiring ID consultation-ICU delirium/encephalopathy requiring on and off infusion of Precedex.  After stability-she was transferred to [facility] on 4/11-where further hospital course was complicated by persistent encephalopathy and MSSA PNA (sputum cultures on 4/10).  She was managed with supportive care-with significant clearing of her encephalopathy.  See below for further details.   ? ?Significant events: ?3/21>> Admit to [facility], intubated, start pressors, cardiology consult for SVT, start amiodarone ?3/24>> transfer to [facility] ?3/28>> Trach for failure to wean ?4/11>> transfer to [facility] ?4/17>> trach downsized to #6 cuffless ?4/18>> SLP reeval and MBS completed-dysphagia 1 diet started. ?4/19>> eating around 40% of some of her meals-at times able to feed herself. ?4/20>> Cortak tube removed ?4/21>> completely awake/alert ? ?Significant studies: ?3/21>> CT head: No acute abnormalities. ?3/21>> CTA chest: No PE, multifocal pneumonia.  Multiple subacute/chronic rib fractures. ?3/21>> CT abdomen/pelvis: No acute finding-thickened endometrium. ?3/23>> RUQ ultrasound: No gallstones ?3/25>> Echo: EF 65-70% ?3/31>> MRI  brain: No acute intracranial abnormality. ?4/01>> CT chest: Extensive multifocal patchy airspace consolidations. ?4/12>> EEG: No seizures. ? ?Significant microbiology data: ?3/21>> COVID/influenza PCR: Negative ?3/21>> blood culture: No growth ?3/21>> urine culture: No growth ?3/22>> tracheal aspirate: No growth ?3/23>> urine culture: 1000 colonies/mL of Staph aureus ?3/23>> blood culture: Staph hominis (contamination) ?3/27>> tracheal aspirate: No growth ?3/31>> blood culture 1/2: Staph epidermidis (contamination) ?4/01>> tracheal aspirate: No growth ?4/03>> blood culture: No growth ?4/05>> respiratory virus panel: Meta pneumonia virus ?4/10>> tracheal aspirate: MSSA ? ?Antimicrobial therapy: ?Doxycyline 3/22 >> 3/24 ?Ceftriaxone 3/21 >>3/25 ?Azithromycin 3/21 x1 dose ?Vancomycin 3/24 >>3/25; 4/1>> 4/4, 4/10 >>4/13 ?Zosyn 3/25 >> 3/28 ?Cefepime 3/31>> 4/4 ?Diflucan 4/1>> 4/4 ?Cefazolin 4/13>> 4/19. ? ?Procedures: ?3/28>> tracheostomy ? ?Consults: ?ID, PCCM, cardiology ? ?Subjective: Patient in bed, appears comfortable, denies any headache, no fever, no chest pain or pressure, no shortness of breath , no abdominal pain. No focal weakness. ? ?Objective: ?Vitals: ?Blood pressure 114/69, pulse 93, temperature 98.3 ?F (36.8 ?C), temperature source Oral, resp. rate 17, height 5\' 5"  (1.651 m), weight 94.3 kg, last menstrual period 01/05/2015, SpO2 98 %.  ? ?Exam: ? ?Awake Alert, No new F.N deficits, Normal affect ?[place].AT,PERRAL ?Supple Neck, No JVD, trach removed.  Bandage is clean. ?Symmetrical Chest wall movement, Good air movement bilaterally, CTAB ?RRR,No Gallops, Rubs or new Murmurs,  ?+ve B.Sounds, Abd Soft, No tenderness,   ?No Cyanosis, Clubbing or edema  ? ?Assessment/Plan: ? ?Septic shock, acute on chronic hypoxic and hypercapnic respiratory failure due to multifocal aspiration pneumonia requiring tracheostomy in ICU : Sepsis physiology has resolved-clinically improved-leukocytosis still persists but has now  started to downtrend.  Chest pathophysiology has now resolved, she has  completed her antibiotic treatment.  She also had viral infection with metapneumovirus.  She is currently on #6 cuffless trach with trach collar.  Clinically improving.  She was decannulated on 07/29/2021, trach site occlusive bandage appears clean.  Continue to monitor. ? ?Acute metabolic encephalopathy: Likely multifactorial etiology-due to PNA/ICU delirium/sepsis/polypharmacy.  Deficits much improved with supportive.  ? ?Dysphagia: Due to severe encephalopathy/recent trach-had a cortak tube in place-but with significant improvement in encephalopathy-cortak tubewas discontinued on 4/20.  Per nursing staff-significant improvement in her appetite.  Speech following on oral diet. ? ?Asymptomatic transaminitis: Unclear significance-could have been from sepsis-improved-RUQ ultrasound was without acute abnormalities. ? ?COPD: Continue bronchodilators-on chronic prednisone. ? ?SVT: unsuccessful cardioversion on initial presentation to the ED-subsequently required amiodarone infusion-currently maintaining sinus rhythm-on PO Lopressor dose increased 07/28/21. ? ?Acute on chronic HFpEF,  EF preserved at 65%: Volume status stable-continue Aldactone - PRN IV Lasix. ? ?Bilateral extremity lymphedema: Continues to have significant lower extremity edema- K rising - DC Aldactone - as required several doses of as needed IV Lasix. ? ?HTN: BP stable-continue Lopressor. ? ?HLD: Continue statin ?  ?GERD: Continue PPI ? ?Normocytic anemia: Due to acute illness-no evidence of blood loss-transfuse if Hb <7 ? ?Endometrial thickening on CT abdomen: Will require outpatient follow-up with GYN. ? ?Opiate dependence: Continue oxycodone-dosage has been gradually decreased to 10 mg every 8 hours.  She is on chronic narcotics at home.  ? ?Recent right pubic rami fracture: Supportive care at this point. ? ?Hypokalemia.  Replaced.   ? ?Constipation.  Resolved after bowel regimen  continue. ? ?Palliative care: She wishes to be full code decisions to be made by husband according to the patient. ? ?Chronic pain.  On home dose narcotics.  Avoid overdose.  In no distress whatsoever.  Some narcotic seeking behavior  ? ?DM-2 (A1c 7.5 on 3/22): CBGs relatively stable-continue SSI ? ?CBG (last 3)  ?Recent Labs  ?  07/29/21 ?1610 07/29/21 ?2030 07/30/21 ?0807  ?GLUCAP 216* 86 99  ? ? ? ?Nutrition Status: ?Nutrition Problem: Inadequate oral intake ?Etiology: inability to eat ?Signs/Symptoms: NPO status ?Interventions: Ensure Enlive (each supplement provides 350kcal and 20 grams of protein), MVI ? ?Obesity ?Estimated body mass index is 34.6 kg/m? as calculated from the following: ?  Height as of this encounter: 5\' 5"  (1.651 m). ?  Weight as of this encounter: 94.3 kg.  ? ?Code status: ?  Code Status: Full Code  ? ?DVT Prophylaxis: Prophylactic Lovenox ? ?Family Communication: Spouse-Tony-[phone] called-no response on 4/21. ? ?Disposition Plan: ?Status is: Inpatient ?Remains inpatient appropriate because: Resolving septic shock-encephalopathy-plan is now for SNF-but per social work-can only be placed in SNF 1 month after trach placed.   ?  ?Planned Discharge Destination: SNF. ? ? ?Diet: ?Diet Order   ? ?       ?  DIET DYS 3 Room service appropriate? Yes with Assist; Fluid consistency: Thin  Diet effective now       ?  ? ?  ?  ? ?  ?  ?MEDICATIONS: ?Scheduled Meds: ? arformoterol  15 mcg Nebulization BID  ? aspirin  81 mg Oral Daily  ? atorvastatin  40 mg Oral Daily  ? budesonide (PULMICORT) nebulizer solution  0.5 mg Nebulization BID  ? chlorhexidine  15 mL Mouth Rinse BID  ? Chlorhexidine Gluconate Cloth  6 each Topical Q0600  ? clonazepam  0.25 mg Oral BID  ? docusate sodium  200 mg Oral BID  ? DULoxetine  20 mg Oral Daily  ?  enoxaparin (LOVENOX) injection  50 mg Subcutaneous Q24H  ? feeding supplement  237 mL Oral BID BM  ? gabapentin  300 mg Oral Q8H  ? guaiFENesin  30 mL Oral Q6H  ? insulin  aspart  0-9 Units Subcutaneous TID WC  ? mouth rinse  15 mL Mouth Rinse q12n4p  ? metoprolol tartrate  25 mg Oral BID  ? multivitamin with minerals  1 tablet Oral Daily  ? pantoprazole sodium  40 mg Oral Dail

## 2021-07-30 NOTE — Progress Notes (Signed)
Speech Language Pathology Treatment: Dysphagia  ?Patient Details ?Name: Gina Bradley ?MRN: [id number] ?DOB: [birth year] ?Today's Date: 07/30/2021 ?Time: [id number] ?SLP Time Calculation (min) (ACUTE ONLY): 20 min ? ?Assessment / Plan / Recommendation ?Clinical Impression ? Pt was seen after decannulation on previous date. Some air is noted to be leaking through the stoma and pt had questions about this and her stoma in general. SLP provided education and encouraged her to provide gentle pressure over the covered stoma PRN as it is healing. SLP observed some of her lunch meal noting an immediate cough x1, after which pt commented that it felt like it went down the wrong way, but that can't happen anymore. Pt said she thought she couldn't get choked anymore since her trach was removed. SLP provided education on anatomy, swallowing, and precautions. Would continue on Dys 3 diet and thin liquids, reducing environmental distractions as much as possible during meal.  ?  ?HPI HPI: 61 yo female former smoker presented to [facility] ER on 3/21 with AMS, fever.  Found to have multifocal pneumonia and required intubation and pressors.  Transferred to [facility] for further management. ETT 3/21; trach 3/28 secondary to failure to wean; TC trials 4/8. Dx acute metabolic encephalopathy, pmhx peripheral neuropathy, HF, HTN, DM2. ?  ?   ?SLP Plan ? Continue with current plan of care ? ?  ?  ?Recommendations for follow up therapy are one component of a multi-disciplinary discharge planning process, led by the attending physician.  Recommendations may be updated based on patient status, additional functional criteria and insurance authorization. ?  ? ?Recommendations  ?Diet recommendations: Dysphagia 3 (mechanical soft);Thin liquid ?Liquids provided via: Cup;Straw ?Medication Administration: Whole meds with puree ?Supervision: Intermittent supervision to cue for compensatory strategies ?Compensations: Minimize environmental distractions ?Postural  Changes and/or Swallow Maneuvers: Seated upright 90 degrees  ?   ?    ?   ? ? ? ? Oral Care Recommendations: Oral care BID ?Follow Up Recommendations: Skilled nursing-short term rehab (<3 hours/day) ?Assistance recommended at discharge: Frequent or constant Supervision/Assistance ?SLP Visit Diagnosis: Aphonia (R49.1) ?Plan: Continue with current plan of care ? ? ? ? ?  ?  ? ? ?Osie Bond., M.A. CCC-SLP ?Acute Rehabilitation Services ?Office [phone] ? ?Secure chat preferred ? ? ?07/30/2021, 1:05 PM ?

## 2021-07-30 NOTE — Progress Notes (Signed)
Physical Therapy Treatment ?Patient Details ?Name: Gina Bradley ?MRN: [id number] ?DOB: [birth year] ?Today's Date: 07/30/2021 ? ? ?History of Present Illness 61 yo female former smoker presented to [facility] ER on 3/21 with AMS, fever.  Found to have multifocal pneumonia and required intubation and pressors.  Transferred to [facility] for further management. ETT 3/21; trach 3/28 secondary to failure to wean; TC trials 4/8. Decannulated 4/27.   Dx acute metabolic encephalopathy, pmhx peripheral neuropathy, HF, HTN, DM2. ? ?  ?PT Comments  ? ? Pt admitted with above diagnosis. Pt was able to ambulate with min assist of 2 persons as she needs chair follow as she has unsteady gait and needs to sit without warning at times. Pt progressing and was on [facility] today with sats 87%.   Pt currently with functional limitations due to balance and endurance deficits. Pt will benefit from skilled PT to increase their independence and safety with mobility to allow discharge to the venue listed below.      ?Recommendations for follow up therapy are one component of a multi-disciplinary discharge planning process, led by the attending physician.  Recommendations may be updated based on patient status, additional functional criteria and insurance authorization. ? ?Follow Up Recommendations ? Skilled nursing-short term rehab (<3 hours/day) ?  ?  ?Assistance Recommended at Discharge Frequent or constant Supervision/Assistance  ?Patient can return home with the following Two people to help with walking and/or transfers;A lot of help with bathing/dressing/bathroom;Assistance with cooking/housework;Assistance with feeding;Direct supervision/assist for medications management;Direct supervision/assist for financial management;Assist for transportation;Help with stairs or ramp for entrance ?  ?Equipment Recommendations ? BSC/3in1;Wheelchair (measurements PT);Wheelchair cushion (measurements PT);Hospital bed;Other (comment)  ?  ?Recommendations for Other  Services OT consult ? ? ?  ?Precautions / Restrictions Precautions ?Precautions: Fall ?Precaution Comments: hx of fall with L4 compression fx ?Restrictions ?Weight Bearing Restrictions: No  ?  ? ?Mobility ? Bed Mobility ?Overal bed mobility: Needs Assistance ?Bed Mobility: Supine to Sit ?Rolling: Modified independent (Device/Increase time) ?Sidelying to sit: HOB elevated, Supervision ?Supine to sit: Supervision ?  ?  ?General bed mobility comments: supervision with cues for sequencing and to stay on task ?  ? ?Transfers ?Overall transfer level: Needs assistance ?Equipment used: Rolling walker (2 wheels) ?Transfers: Sit to/from Stand, Bed to chair/wheelchair/BSC ?Sit to Stand: Min assist ?  ?  ?  ?  ?  ?General transfer comment: Pt completed sit to stand to  RW needing steadying assist. cues for hand placement ?  ? ?Ambulation/Gait ?Ambulation/Gait assistance: Min assist, +2 safety/equipment ?Gait Distance (Feet): 12 Feet (5 feet then 7 feet) ?Assistive device: Rolling walker (2 wheels) ?Gait Pattern/deviations: Step-to pattern, Decreased stride length, Wide base of support, Trunk flexed, Drifts right/left ?  ?Gait velocity interpretation: <1.31 ft/sec, indicative of household ambulator ?  ?General Gait Details: support for advancing RW and balance as well as to steady at times. Pt also needed cues to stay close to RW.  +dyspnea with sats 87% on 2LO2, other VSS. ? ? ?Stairs ?  ?  ?  ?  ?  ? ? ?Wheelchair Mobility ?  ? ?Modified Rankin (Stroke Patients Only) ?  ? ? ?  ?Balance Overall balance assessment: Needs assistance ?Sitting-balance support: Feet supported ?Sitting balance-Leahy Scale: Fair ?Sitting balance - Comments: at EOB did not require UE support to maintain ?  ?Standing balance support: Bilateral upper extremity supported, During functional activity, Reliant on assistive device for balance ?Standing balance-Leahy Scale: Poor ?Standing balance comment: Pt needs UE support on the  RW for completion of  transfer as well as +2 min assist for safety. ?  ?  ?  ?  ?  ?  ?  ?  ?  ?  ?  ?  ? ?  ?Cognition Arousal/Alertness: Awake/alert ?Behavior During Therapy: [facility] for tasks assessed/performed ?Overall Cognitive Status: Impaired/Different from baseline ?Area of Impairment: Memory, Safety/judgement, Awareness, Attention, Following commands, Problem solving ?  ?  ?  ?  ?  ?  ?  ?  ?Orientation Level: Disoriented to, Time ?Current Attention Level: Sustained ?Memory: Decreased short-term memory ?Following Commands: Follows multi-step commands inconsistently ?Safety/Judgement: Decreased awareness of safety, Decreased awareness of deficits ?Awareness: Intellectual ?Problem Solving: Requires tactile cues, Requires verbal cues, Decreased initiation, Slow processing, Difficulty sequencing ?General Comments: Pt needs mod to max instructional cueing for sustained attention to bathing tasks.  Increased internal distractions at times with sequencing. ?  ?  ? ?  ?Exercises General Exercises - Lower Extremity ?Ankle Circles/Pumps: AROM, Both, 10 reps ?Long Arc Quad: AROM, Both, Seated, 10 reps ?Hip Flexion/Marching: AROM, Both, 10 reps, Seated ? ?  ?General Comments   ?  ?  ? ?Pertinent Vitals/Pain Pain Assessment ?Pain Assessment: Faces ?Faces Pain Scale: Hurts even more ?Pain Location: generalized ?Pain Descriptors / Indicators: Grimacing, Guarding ?Pain Intervention(s): Limited activity within patient's tolerance, Monitored during session, Repositioned  ? ? ?Home Living   ?  ?  ?  ?  ?  ?  ?  ?  ?  ?   ?  ?Prior Function    ?  ?  ?   ? ?PT Goals (current goals can now be found in the care plan section) Acute Rehab PT Goals ?Patient Stated Goal: wants to get stronger ?Progress towards PT goals: Progressing toward goals ? ?  ?Frequency ? ? ? Min 3X/week ? ? ? ?  ?PT Plan Current plan remains appropriate  ? ? ?Co-evaluation   ?  ?  ?  ?  ? ?  ?AM-PAC PT "6 Clicks" Mobility   ?Outcome Measure ? Help needed turning from your back to your  side while in a flat bed without using bedrails?: None ?Help needed moving from lying on your back to sitting on the side of a flat bed without using bedrails?: None ?Help needed moving to and from a bed to a chair (including a wheelchair)?: A Little ?Help needed standing up from a chair using your arms (e.g., wheelchair or bedside chair)?: Total ?Help needed to walk in hospital room?: Total ?Help needed climbing 3-5 steps with a railing? : Total ?6 Click Score: 14 ? ?  ?End of Session Equipment Utilized During Treatment: Oxygen;Gait belt ?Activity Tolerance: Patient limited by fatigue ?Patient left: in chair;with chair alarm set;with call bell/phone within reach ?Nurse Communication: Mobility status ?PT Visit Diagnosis: Unsteadiness on feet (R26.81);Muscle weakness (generalized) (M62.81);Other abnormalities of gait and mobility (R26.89);History of falling (Z91.81);Difficulty in walking, not elsewhere classified (R26.2) ?Pain - Right/Left: Left ?Pain - part of body: Hip ?  ? ? ?Time: [id number] ?PT Time Calculation (min) (ACUTE ONLY): 26 min ? ?Charges:  $Gait Training: 8-22 mins ?$Therapeutic Exercise: 8-22 mins          ?          ? ?Binyomin Brann M,PT ?Acute Rehab Services ?[phone] ?[phone] (pager)  ? ? ?Alvira Philips ?07/30/2021, 3:47 PM ? ?

## 2021-07-30 NOTE — Progress Notes (Signed)
Looks good decannulated. ?Allow 3-4 weeks for stoma to close, if does not send to ENT. ? ?Available PRN ?

## 2021-07-31 DIAGNOSIS — J9602 Acute respiratory failure with hypercapnia: Secondary | ICD-10-CM | POA: Diagnosis not present

## 2021-07-31 DIAGNOSIS — J9601 Acute respiratory failure with hypoxia: Secondary | ICD-10-CM | POA: Diagnosis not present

## 2021-07-31 LAB — COMPREHENSIVE METABOLIC PANEL
ALT: 33 U/L (ref 0–44)
AST: 41 U/L (ref 15–41)
Albumin: 2.9 g/dL — ABNORMAL LOW (ref 3.5–5.0)
Alkaline Phosphatase: 88 U/L (ref 38–126)
Anion gap: 6 (ref 5–15)
BUN: 6 mg/dL (ref 6–20)
CO2: 29 mmol/L (ref 22–32)
Calcium: 9.3 mg/dL (ref 8.9–10.3)
Chloride: 101 mmol/L (ref 98–111)
Creatinine, Ser: 0.59 mg/dL (ref 0.44–1.00)
GFR, Estimated: >60 mL/min (ref >60)
Glucose, Bld: 120 mg/dL — ABNORMAL HIGH (ref 70–99)
Potassium: 3.7 mmol/L (ref 3.5–5.1)
Sodium: 136 mmol/L (ref 135–145)
Total Bilirubin: 0.5 mg/dL (ref 0.3–1.2)
Total Protein: 6 g/dL — ABNORMAL LOW (ref 6.5–8.1)

## 2021-07-31 LAB — CBC WITH DIFFERENTIAL/PLATELET
Abs Immature Granulocytes: 0.17 10*3/uL — ABNORMAL HIGH (ref 0.00–0.07)
Basophils Absolute: 0.1 10*3/uL (ref 0.0–0.1)
Basophils Relative: 1 %
Eosinophils Absolute: 0.4 10*3/uL (ref 0.0–0.5)
Eosinophils Relative: 4 %
HCT: 30.9 % — ABNORMAL LOW (ref 36.0–46.0)
Hemoglobin: 9.5 g/dL — ABNORMAL LOW (ref 12.0–15.0)
Immature Granulocytes: 2 %
Lymphocytes Relative: 29 %
Lymphs Abs: 2.9 10*3/uL (ref 0.7–4.0)
MCH: 30.2 pg (ref 26.0–34.0)
MCHC: 30.7 g/dL (ref 30.0–36.0)
MCV: 98.1 fL (ref 80.0–100.0)
Monocytes Absolute: 1 10*3/uL (ref 0.1–1.0)
Monocytes Relative: 10 %
Neutro Abs: 5.7 10*3/uL (ref 1.7–7.7)
Neutrophils Relative %: 54 %
Platelets: 323 10*3/uL (ref 150–400)
RBC: 3.15 MIL/uL — ABNORMAL LOW (ref 3.87–5.11)
RDW: 17.2 % — ABNORMAL HIGH (ref 11.5–15.5)
WBC: 10.2 10*3/uL (ref 4.0–10.5)
nRBC: 0 % (ref 0.0–0.2)

## 2021-07-31 LAB — GLUCOSE, CAPILLARY
Glucose-Capillary: 180 mg/dL — ABNORMAL HIGH (ref 70–99)
Glucose-Capillary: 256 mg/dL — ABNORMAL HIGH (ref 70–99)
Glucose-Capillary: 198 mg/dL — ABNORMAL HIGH (ref 70–99)
Glucose-Capillary: 137 mg/dL — ABNORMAL HIGH (ref 70–99)

## 2021-07-31 LAB — BRAIN NATRIURETIC PEPTIDE: B Natriuretic Peptide: 23.2 pg/mL (ref 0.0–100.0)

## 2021-07-31 LAB — MAGNESIUM: Magnesium: 1.6 mg/dL — ABNORMAL LOW (ref 1.7–2.4)

## 2021-07-31 MED ORDER — FUROSEMIDE 40 MG PO TABS
40.0000 mg | ORAL_TABLET | Freq: Once | ORAL | Status: AC
  Administered 2021-07-31: 40 mg via ORAL
  Filled 2021-07-31 (×3): qty 1

## 2021-07-31 MED ORDER — MAGNESIUM SULFATE 4 GM/100ML IV SOLN
4.0000 g | Freq: Once | INTRAVENOUS | Status: AC
  Administered 2021-07-31: 4 g via INTRAVENOUS
  Filled 2021-07-31: qty 100

## 2021-07-31 MED ORDER — POTASSIUM CHLORIDE CRYS ER 20 MEQ PO TBCR
20.0000 meq | EXTENDED_RELEASE_TABLET | Freq: Once | ORAL | Status: AC
  Administered 2021-07-31: 20 meq via ORAL
  Filled 2021-07-31: qty 1

## 2021-07-31 MED ORDER — ALUM & MAG HYDROXIDE-SIMETH 200-200-20 MG/5ML PO SUSP
30.0000 mL | Freq: Four times a day (QID) | ORAL | Status: DC | PRN
  Administered 2021-07-31 – 2021-08-02 (×5): 30 mL via ORAL
  Filled 2021-07-31 (×5): qty 30

## 2021-07-31 NOTE — Progress Notes (Signed)
?      ?                 PROGRESS NOTE ? ?      ?PATIENT DETAILS ?Name: Gina Bradley ?Age: 61 y.o. ?Sex: female ?Date of Birth: 08-19-1960 ?Admit Date: 06/22/2021 ?Admitting Physician Rolla Plate, DO ?[id number], Purcell Nails, MD ? ?Brief Summary: ?Patient is a 61 y.o.  female with history of DM-2, HTN, SVT, HFpEF, steroid-dependent COPD, opiate dependence, recent compression fracture at L4/right inferior pubic rami fracture-who presented to [facility] ED with altered mental status, SOB with hypoxia, SVT-she was thought to have septic shock due to PNA-she was subsequently intubated-started on broad-spectrum antibiotics/pressors and managed in the ICU.  She was subsequently transferred to [facility] ICU-she was stabilized-however she had failure to wean and underwent a tracheostomy on 3/28.  While in the ICU-she developed fever requiring ID consultation-ICU delirium/encephalopathy requiring on and off infusion of Precedex.  After stability-she was transferred to [facility] on 4/11-where further hospital course was complicated by persistent encephalopathy and MSSA PNA (sputum cultures on 4/10).  She was managed with supportive care-with significant clearing of her encephalopathy.  See below for further details.   ? ?Significant events: ?3/21>> Admit to [facility], intubated, start pressors, cardiology consult for SVT, start amiodarone ?3/24>> transfer to [facility] ?3/28>> Trach for failure to wean ?4/11>> transfer to [facility] ?4/17>> trach downsized to #6 cuffless ?4/18>> SLP reeval and MBS completed-dysphagia 1 diet started. ?4/19>> eating around 40% of some of her meals-at times able to feed herself. ?4/20>> Cortak tube removed ?4/21>> completely awake/alert ? ?Significant studies: ?3/21>> CT head: No acute abnormalities. ?3/21>> CTA chest: No PE, multifocal pneumonia.  Multiple subacute/chronic rib fractures. ?3/21>> CT abdomen/pelvis: No acute finding-thickened endometrium. ?3/23>> RUQ ultrasound: No gallstones ?3/25>> Echo: EF 65-70% ?3/31>> MRI  brain: No acute intracranial abnormality. ?4/01>> CT chest: Extensive multifocal patchy airspace consolidations. ?4/12>> EEG: No seizures. ? ?Significant microbiology data: ?3/21>> COVID/influenza PCR: Negative ?3/21>> blood culture: No growth ?3/21>> urine culture: No growth ?3/22>> tracheal aspirate: No growth ?3/23>> urine culture: 1000 colonies/mL of Staph aureus ?3/23>> blood culture: Staph hominis (contamination) ?3/27>> tracheal aspirate: No growth ?3/31>> blood culture 1/2: Staph epidermidis (contamination) ?4/01>> tracheal aspirate: No growth ?4/03>> blood culture: No growth ?4/05>> respiratory virus panel: Meta pneumonia virus ?4/10>> tracheal aspirate: MSSA ? ?Antimicrobial therapy: ?Doxycyline 3/22 >> 3/24 ?Ceftriaxone 3/21 >>3/25 ?Azithromycin 3/21 x1 dose ?Vancomycin 3/24 >>3/25; 4/1>> 4/4, 4/10 >>4/13 ?Zosyn 3/25 >> 3/28 ?Cefepime 3/31>> 4/4 ?Diflucan 4/1>> 4/4 ?Cefazolin 4/13>> 4/19. ? ?Procedures: ?3/28>> tracheostomy ? ?Consults: ?ID, PCCM, cardiology ? ?Subjective: Patient in bed, appears comfortable, denies any headache, no fever, no chest pain or pressure, no shortness of breath , no abdominal pain. No new focal weakness. ? ? ?Objective: ?Vitals: ?Blood pressure 113/71, pulse 100, temperature 98.3 ?F (36.8 ?C), temperature source Oral, resp. rate 16, height 5\' 5"  (1.651 m), weight 94.3 kg, last menstrual period 01/05/2015, SpO2 91 %.  ? ?Exam: ? ?Awake Alert, No new F.N deficits, Normal affect ?[place].AT,PERRAL ?Supple Neck, No JVD, trach site with occlusive bandage, appears clean ?Symmetrical Chest wall movement, Good air movement bilaterally, CTAB ?RRR,No Gallops, Rubs or new Murmurs,  ?+ve B.Sounds, Abd Soft, No tenderness,   ?No Cyanosis, Clubbing or edema  ? ? ?Assessment/Plan: ? ?Septic shock, acute on chronic hypoxic and hypercapnic respiratory failure due to multifocal aspiration pneumonia requiring tracheostomy in ICU : Sepsis physiology has resolved-clinically improved-leukocytosis still  persists but has now started to downtrend.  Chest pathophysiology has  now resolved, she has completed her antibiotic treatment.  She also had viral infection with metapneumovirus.  She is currently on #6 cuffless trach with trach collar.  Clinically improving.  She was decannulated on 07/29/2021, trach site occlusive bandage appears clean.  Continue to monitor. ? ?Acute metabolic encephalopathy: Likely multifactorial etiology-due to PNA/ICU delirium/sepsis/polypharmacy.  Deficits much improved with supportive.  ? ?Dysphagia: Due to severe encephalopathy/recent trach-had a cortak tube in place-but with significant improvement in encephalopathy-cortak tubewas discontinued on 4/20.  Per nursing staff-significant improvement in her appetite.  Speech following on oral diet. ? ?Asymptomatic transaminitis: Unclear significance-could have been from sepsis-improved-RUQ ultrasound was without acute abnormalities. ? ?COPD: Continue bronchodilators-on chronic prednisone. ? ?SVT: unsuccessful cardioversion on initial presentation to the ED-subsequently required amiodarone infusion-currently maintaining sinus rhythm-on PO Lopressor dose increased 07/28/21. ? ?Acute on chronic HFpEF,  EF preserved at 65%: Volume status stable-continue Aldactone - PRN IV Lasix. ? ?Bilateral extremity lymphedema: Continues to have significant lower extremity edema- K rising - DC Aldactone - as required several doses of as needed IV Lasix. ? ?HTN: BP stable-continue Lopressor. ? ?HLD: Continue statin ?  ?GERD: Continue PPI ? ?Normocytic anemia: Due to acute illness-no evidence of blood loss-transfuse if Hb <7 ? ?Endometrial thickening on CT abdomen: Will require outpatient follow-up with GYN. ? ?Opiate dependence: Continue oxycodone-dosage has been gradually decreased to 10 mg every 8 hours.  She is on chronic narcotics at home.  ? ?Recent right pubic rami fracture: Supportive care at this point. ? ?Hypokalemia.  Replaced.   ? ?Constipation.  Resolved  after bowel regimen continue. ? ?Palliative care: She wishes to be full code decisions to be made by husband according to the patient. ? ?Chronic pain.  On home dose narcotics.  Avoid overdose.  In no distress whatsoever.  Some narcotic seeking behavior  ? ?DM-2 (A1c 7.5 on 3/22): CBGs relatively stable-continue SSI ? ?CBG (last 3)  ?Recent Labs  ?  07/30/21 ?1725 07/30/21 ?2156 07/31/21 ?0825  ?GLUCAP 155* 120* 180*  ? ? ? ?Nutrition Status: ?Nutrition Problem: Inadequate oral intake ?Etiology: inability to eat ?Signs/Symptoms: NPO status ?Interventions: Ensure Enlive (each supplement provides 350kcal and 20 grams of protein), MVI ? ?Obesity ?Estimated body mass index is 34.6 kg/m? as calculated from the following: ?  Height as of this encounter: 5\' 5"  (1.651 m). ?  Weight as of this encounter: 94.3 kg.  ? ?Code status: ?  Code Status: Full Code  ? ?DVT Prophylaxis: Prophylactic Lovenox ? ?Family Communication: Spouse-[phone] called-no response on 4/21. ? ?Disposition Plan: ?Status is: Inpatient ?Remains inpatient appropriate because: Resolving septic shock-encephalopathy-plan is now for SNF-but per social work-can only be placed in SNF 1 month after trach placed.   ?  ?Planned Discharge Destination: SNF. ? ? ?Diet: ?Diet Order   ? ?       ?  DIET DYS 3 Room service appropriate? Yes with Assist; Fluid consistency: Thin  Diet effective now       ?  ? ?  ?  ? ?  ?  ?MEDICATIONS: ?Scheduled Meds: ? arformoterol  15 mcg Nebulization BID  ? aspirin  81 mg Oral Daily  ? atorvastatin  40 mg Oral Daily  ? budesonide (PULMICORT) nebulizer solution  0.5 mg Nebulization BID  ? chlorhexidine  15 mL Mouth Rinse BID  ? Chlorhexidine Gluconate Cloth  6 each Topical Q0600  ? clonazepam  0.25 mg Oral BID  ? docusate sodium  200 mg Oral BID  ? DULoxetine  20 mg Oral Daily  ? enoxaparin (LOVENOX) injection  50 mg Subcutaneous Q24H  ? feeding supplement  237 mL Oral BID BM  ? gabapentin  300 mg Oral Q8H  ? guaiFENesin  30 mL  Oral Q6H  ? insulin aspart  0-9 Units Subcutaneous TID WC  ? mouth rinse  15 mL Mouth Rinse q12n4p  ? metoprolol tartrate  25 mg Oral BID  ? multivitamin with minerals  1 tablet Oral Daily  ? pantopra

## 2021-08-01 DIAGNOSIS — J9601 Acute respiratory failure with hypoxia: Secondary | ICD-10-CM | POA: Diagnosis not present

## 2021-08-01 DIAGNOSIS — J9602 Acute respiratory failure with hypercapnia: Secondary | ICD-10-CM | POA: Diagnosis not present

## 2021-08-01 LAB — GLUCOSE, CAPILLARY
Glucose-Capillary: 159 mg/dL — ABNORMAL HIGH (ref 70–99)
Glucose-Capillary: 172 mg/dL — ABNORMAL HIGH (ref 70–99)
Glucose-Capillary: 163 mg/dL — ABNORMAL HIGH (ref 70–99)
Glucose-Capillary: 147 mg/dL — ABNORMAL HIGH (ref 70–99)

## 2021-08-01 LAB — MAGNESIUM: Magnesium: 2 mg/dL (ref 1.7–2.4)

## 2021-08-01 NOTE — Progress Notes (Signed)
?      ?                 PROGRESS NOTE ? ?      ?PATIENT DETAILS ?Name: Gina Bradley ?Age: 61 y.o. ?Sex: female ?Date of Birth: 1960-07-17 ?Admit Date: 06/22/2021 ?Admitting Physician Rolla Plate, DO ?[id number], Purcell Nails, MD ? ?Brief Summary: ?Patient is a 61 y.o.  female with history of DM-2, HTN, SVT, HFpEF, steroid-dependent COPD, opiate dependence, recent compression fracture at L4/right inferior pubic rami fracture-who presented to [facility] ED with altered mental status, SOB with hypoxia, SVT-she was thought to have septic shock due to PNA-she was subsequently intubated-started on broad-spectrum antibiotics/pressors and managed in the ICU.  She was subsequently transferred to [facility] ICU-she was stabilized-however she had failure to wean and underwent a tracheostomy on 3/28.  While in the ICU-she developed fever requiring ID consultation-ICU delirium/encephalopathy requiring on and off infusion of Precedex.  After stability-she was transferred to [facility] on 4/11-where further hospital course was complicated by persistent encephalopathy and MSSA PNA (sputum cultures on 4/10).  She was managed with supportive care-with significant clearing of her encephalopathy.  See below for further details.   ? ?Significant events: ?3/21>> Admit to [facility], intubated, start pressors, cardiology consult for SVT, start amiodarone ?3/24>> transfer to [facility] ?3/28>> Trach for failure to wean ?4/11>> transfer to [facility] ?4/17>> trach downsized to #6 cuffless ?4/18>> SLP reeval and MBS completed-dysphagia 1 diet started. ?4/19>> eating around 40% of some of her meals-at times able to feed herself. ?4/20>> Cortak tube removed ?4/21>> completely awake/alert ? ?Significant studies: ?3/21>> CT head: No acute abnormalities. ?3/21>> CTA chest: No PE, multifocal pneumonia.  Multiple subacute/chronic rib fractures. ?3/21>> CT abdomen/pelvis: No acute finding-thickened endometrium. ?3/23>> RUQ ultrasound: No gallstones ?3/25>> Echo: EF 65-70% ?3/31>> MRI  brain: No acute intracranial abnormality. ?4/01>> CT chest: Extensive multifocal patchy airspace consolidations. ?4/12>> EEG: No seizures. ? ?Significant microbiology data: ?3/21>> COVID/influenza PCR: Negative ?3/21>> blood culture: No growth ?3/21>> urine culture: No growth ?3/22>> tracheal aspirate: No growth ?3/23>> urine culture: 1000 colonies/mL of Staph aureus ?3/23>> blood culture: Staph hominis (contamination) ?3/27>> tracheal aspirate: No growth ?3/31>> blood culture 1/2: Staph epidermidis (contamination) ?4/01>> tracheal aspirate: No growth ?4/03>> blood culture: No growth ?4/05>> respiratory virus panel: Meta pneumonia virus ?4/10>> tracheal aspirate: MSSA ? ?Antimicrobial therapy: ?Doxycyline 3/22 >> 3/24 ?Ceftriaxone 3/21 >>3/25 ?Azithromycin 3/21 x1 dose ?Vancomycin 3/24 >>3/25; 4/1>> 4/4, 4/10 >>4/13 ?Zosyn 3/25 >> 3/28 ?Cefepime 3/31>> 4/4 ?Diflucan 4/1>> 4/4 ?Cefazolin 4/13>> 4/19. ? ?Procedures: ?3/28>> tracheostomy ? ?Consults: ?ID, PCCM, cardiology ? ?Subjective: Patient in bed, appears comfortable, denies any headache, no fever, no chest pain or pressure, no shortness of breath , no abdominal pain. No new focal weakness. ? ? ? ?Objective: ?Vitals: ?Blood pressure (!) 105/94, pulse (!) 102, temperature 97.6 ?F (36.4 ?C), temperature source Oral, resp. rate 16, height 5\' 5"  (1.651 m), weight 96.2 kg, last menstrual period 01/05/2015, SpO2 91 %.  ? ?Exam: ? ?Awake Alert, No new F.N deficits, Normal affect ?[place].AT,PERRAL ?Supple Neck, trach site with occlusive bandage, appears clean  ?Symmetrical Chest wall movement, Good air movement bilaterally, CTAB ?RRR,No Gallops, Rubs or new Murmurs,  ?+ve B.Sounds, Abd Soft, No tenderness,   ?No Cyanosis, Clubbing or edema  ? ? ? ?Assessment/Plan: ? ?Septic shock, acute on chronic hypoxic and hypercapnic respiratory failure due to multifocal aspiration pneumonia requiring tracheostomy in ICU : Sepsis physiology has resolved-clinically improved-leukocytosis  still persists but has now started to downtrend.  Chest pathophysiology has now resolved, she has completed her antibiotic treatment.  She also had viral infection with metapneumovirus.  She is currently on #6 cuffless trach with trach collar.  Clinically improving.  She was decannulated on 07/29/2021, trach site occlusive bandage appears clean.  Continue to monitor.  Medically stable await SNF bed. ? ?Acute metabolic encephalopathy: Likely multifactorial etiology-due to PNA/ICU delirium/sepsis/polypharmacy.  Deficits much improved with supportive.  ? ?Dysphagia: Due to severe encephalopathy/recent trach-had a cortak tube in place-but with significant improvement in encephalopathy-cortak tubewas discontinued on 4/20.  Per nursing staff-significant improvement in her appetite.  Speech following on oral diet. ? ?Asymptomatic transaminitis: Unclear significance-could have been from sepsis-improved-RUQ ultrasound was without acute abnormalities. ? ?COPD: Continue bronchodilators-on chronic prednisone. ? ?SVT: unsuccessful cardioversion on initial presentation to the ED-subsequently required amiodarone infusion-currently maintaining sinus rhythm-on PO Lopressor dose increased 07/28/21. ? ?Acute on chronic HFpEF,  EF preserved at 65%: Volume status stable-continue Aldactone - PRN Lasix. ? ?Bilateral extremity lymphedema: Continues to have significant lower extremity edema- K rising - DC Aldactone - as required several doses of as needed IV Lasix. ? ?HTN: BP stable-continue Lopressor. ? ?HLD: Continue statin ?  ?GERD: Continue PPI ? ?Normocytic anemia: Due to acute illness-no evidence of blood loss-transfuse if Hb <7 ? ?Endometrial thickening on CT abdomen: Will require outpatient follow-up with GYN. ? ?Opiate dependence: Continue oxycodone-dosage has been gradually decreased to 10 mg every 8 hours.  She is on chronic narcotics at home.  ? ?Recent right pubic rami fracture: Supportive care at this point. ? ?Hypokalemia.   Replaced.   ? ?Constipation.  Resolved after bowel regimen continue. ? ?Palliative care: She wishes to be full code decisions to be made by husband according to the patient. ? ?Chronic pain.  On home dose narcotics.  Avoid overdose.  In no distress whatsoever.  Some narcotic seeking behavior  ? ?DM-2 (A1c 7.5 on 3/22): CBGs relatively stable-continue SSI ? ?CBG (last 3)  ?Recent Labs  ?  07/31/21 ?1542 07/31/21 ?2313 08/01/21 ?0818  ?GLUCAP 198* 137* 159*  ? ? ? ?Nutrition Status: ?Nutrition Problem: Inadequate oral intake ?Etiology: inability to eat ?Signs/Symptoms: NPO status ?Interventions: Ensure Enlive (each supplement provides 350kcal and 20 grams of protein), MVI ? ?Obesity ?Estimated body mass index is 35.29 kg/m? as calculated from the following: ?  Height as of this encounter: 5\' 5"  (1.651 m). ?  Weight as of this encounter: 96.2 kg.  ? ?Code status: ?  Code Status: Full Code  ? ?DVT Prophylaxis: Prophylactic Lovenox ? ?Family Communication: Spouse-[phone] called-no response on 4/21. ? ?Disposition Plan: ?Status is: Inpatient ?Remains inpatient appropriate because: Resolving septic shock-encephalopathy-plan is now for SNF-but per social work-can only be placed in SNF 1 month after trach placed.   ?  ?Planned Discharge Destination: SNF. ? ? ?Diet: ?Diet Order   ? ?       ?  DIET DYS 3 Room service appropriate? Yes with Assist; Fluid consistency: Thin  Diet effective now       ?  ? ?  ?  ? ?  ?  ?MEDICATIONS: ?Scheduled Meds: ? arformoterol  15 mcg Nebulization BID  ? aspirin  81 mg Oral Daily  ? atorvastatin  40 mg Oral Daily  ? budesonide (PULMICORT) nebulizer solution  0.5 mg Nebulization BID  ? chlorhexidine  15 mL Mouth Rinse BID  ? Chlorhexidine Gluconate Cloth  6 each Topical Q0600  ? clonazepam  0.25 mg Oral BID  ? docusate sodium  200 mg Oral BID  ? DULoxetine  20 mg Oral Daily  ? enoxaparin (LOVENOX) injection  50 mg Subcutaneous Q24H  ? feeding supplement  237 mL Oral BID BM  ?  gabapentin  300 mg Oral Q8H  ? guaiFENesin  30 mL Oral Q6H  ? insulin aspart  0-9 Units Subcutaneous TID WC  ? mouth rinse  15 mL Mouth Rinse q12n4p  ? metoprolol tartrate  25 mg Oral BID  ? multivitamin with miner

## 2021-08-01 NOTE — Plan of Care (Signed)

## 2021-08-02 DIAGNOSIS — J9601 Acute respiratory failure with hypoxia: Secondary | ICD-10-CM | POA: Diagnosis not present

## 2021-08-02 DIAGNOSIS — J9602 Acute respiratory failure with hypercapnia: Secondary | ICD-10-CM | POA: Diagnosis not present

## 2021-08-02 LAB — GLUCOSE, CAPILLARY
Glucose-Capillary: 138 mg/dL — ABNORMAL HIGH (ref 70–99)
Glucose-Capillary: 179 mg/dL — ABNORMAL HIGH (ref 70–99)
Glucose-Capillary: 192 mg/dL — ABNORMAL HIGH (ref 70–99)
Glucose-Capillary: 108 mg/dL — ABNORMAL HIGH (ref 70–99)

## 2021-08-02 MED ORDER — TRAMADOL HCL 50 MG PO TABS
50.0000 mg | ORAL_TABLET | Freq: Two times a day (BID) | ORAL | Status: DC
  Administered 2021-08-02 – 2021-08-04 (×6): 50 mg via ORAL
  Filled 2021-08-02 (×6): qty 1

## 2021-08-02 NOTE — Progress Notes (Signed)
?      ?                 PROGRESS NOTE ? ?      ?PATIENT DETAILS ?Name: Gina Bradley ?Age: 61 y.o. ?Sex: female ?Date of Birth: [birth year] ?Admit Date: 06/22/2021 ?Admitting Physician Rolla Plate, DO ?[id number], Purcell Nails, MD ? ?Brief Summary: ?Patient is a 61 y.o.  female with history of DM-2, HTN, SVT, HFpEF, steroid-dependent COPD, opiate dependence, recent compression fracture at L4/right inferior pubic rami fracture-who presented to [facility] ED with altered mental status, SOB with hypoxia, SVT-she was thought to have septic shock due to PNA-she was subsequently intubated-started on broad-spectrum antibiotics/pressors and managed in the ICU.  She was subsequently transferred to [facility] ICU-she was stabilized-however she had failure to wean and underwent a tracheostomy on 3/28.  While in the ICU-she developed fever requiring ID consultation-ICU delirium/encephalopathy requiring on and off infusion of Precedex.  After stability-she was transferred to [facility] on 4/11-where further hospital course was complicated by persistent encephalopathy and MSSA PNA (sputum cultures on 4/10).  She was managed with supportive care-with significant clearing of her encephalopathy.  See below for further details.   ? ?Significant events: ?3/21>> Admit to [facility], intubated, start pressors, cardiology consult for SVT, start amiodarone ?3/24>> transfer to [facility] ?3/28>> Trach for failure to wean ?4/11>> transfer to [facility] ?4/17>> trach downsized to #6 cuffless ?4/18>> SLP reeval and MBS completed-dysphagia 1 diet started. ?4/19>> eating around 40% of some of her meals-at times able to feed herself. ?4/20>> Cortak tube removed ?4/21>> completely awake/alert ? ?Significant studies: ?3/21>> CT head: No acute abnormalities. ?3/21>> CTA chest: No PE, multifocal pneumonia.  Multiple subacute/chronic rib fractures. ?3/21>> CT abdomen/pelvis: No acute finding-thickened endometrium. ?3/23>> RUQ ultrasound: No gallstones ?3/25>> Echo: EF 65-70% ?3/31>> MRI  brain: No acute intracranial abnormality. ?4/01>> CT chest: Extensive multifocal patchy airspace consolidations. ?4/12>> EEG: No seizures. ? ?Significant microbiology data: ?3/21>> COVID/influenza PCR: Negative ?3/21>> blood culture: No growth ?3/21>> urine culture: No growth ?3/22>> tracheal aspirate: No growth ?3/23>> urine culture: 1000 colonies/mL of Staph aureus ?3/23>> blood culture: Staph hominis (contamination) ?3/27>> tracheal aspirate: No growth ?3/31>> blood culture 1/2: Staph epidermidis (contamination) ?4/01>> tracheal aspirate: No growth ?4/03>> blood culture: No growth ?4/05>> respiratory virus panel: Meta pneumonia virus ?4/10>> tracheal aspirate: MSSA ? ?Antimicrobial therapy: ?Doxycyline 3/22 >> 3/24 ?Ceftriaxone 3/21 >>3/25 ?Azithromycin 3/21 x1 dose ?Vancomycin 3/24 >>3/25; 4/1>> 4/4, 4/10 >>4/13 ?Zosyn 3/25 >> 3/28 ?Cefepime 3/31>> 4/4 ?Diflucan 4/1>> 4/4 ?Cefazolin 4/13>> 4/19. ? ?Procedures: ?3/28>> tracheostomy ? ?Consults: ?ID, PCCM, cardiology ? ?Subjective: Patient in bed, appears comfortable, denies any headache, no fever, no chest pain or pressure, no shortness of breath , no abdominal pain. No new focal weakness. ? ?Objective: ?Vitals: ?Blood pressure 112/71, pulse 94, temperature 97.7 ?F (36.5 ?C), temperature source Oral, resp. rate 15, height 5\' 5"  (1.651 m), weight 96.2 kg, last menstrual period 01/05/2015, SpO2 92 %.  ? ?Exam: ? ?Awake Alert, No new F.N deficits, Normal affect ?Shannon.AT,PERRAL ?Supple Neck, trach site with occlusive bandage, appears clean  ?Symmetrical Chest wall movement, Good air movement bilaterally, CTAB ?RRR,No Gallops, Rubs or new Murmurs,  ?+ve B.Sounds, Abd Soft, No tenderness,   ?No Cyanosis, Clubbing or edema  ? ? ?Assessment/Plan: ? ?Septic shock, acute on chronic hypoxic and hypercapnic respiratory failure due to multifocal aspiration pneumonia requiring tracheostomy in ICU : Sepsis physiology has resolved-clinically improved-leukocytosis still persists  but has now started to downtrend.  Chest pathophysiology has now resolved,  she has completed her antibiotic treatment.  She also had viral infection with metapneumovirus.  She is currently on #6 cuffless trach with trach collar.  Clinically improving.  She was decannulated on 07/29/2021, trach site occlusive bandage appears clean.  Continue to monitor.  Medically stable await SNF bed. ? ?Acute metabolic encephalopathy: Likely multifactorial etiology-due to PNA/ICU delirium/sepsis/polypharmacy.  Deficits much improved with supportive.  ? ?Dysphagia: Due to severe encephalopathy/recent trach-had a cortak tube in place-but with significant improvement in encephalopathy-cortak tubewas discontinued on 4/20.  Per nursing staff-significant improvement in her appetite.  Speech following on oral diet. ? ?Asymptomatic transaminitis: Unclear significance-could have been from sepsis-improved-RUQ ultrasound was without acute abnormalities. ? ?COPD: Continue bronchodilators-on chronic prednisone. ? ?SVT: unsuccessful cardioversion on initial presentation to the ED-subsequently required amiodarone infusion-currently maintaining sinus rhythm-on PO Lopressor dose increased 07/28/21. ? ?Acute on chronic HFpEF,  EF preserved at 65%: Volume status stable-continue Aldactone - PRN Lasix. ? ?Bilateral extremity lymphedema: Continues to have significant lower extremity edema- K rising - DC Aldactone - as required several doses of as needed IV Lasix. ? ?HTN: BP stable-continue Lopressor. ? ?HLD: Continue statin ?  ?GERD: Continue PPI ? ?Normocytic anemia: Due to acute illness-no evidence of blood loss-transfuse if Hb <7 ? ?Endometrial thickening on CT abdomen: Will require outpatient follow-up with GYN. ? ?Opiate dependence: Continue oxycodone-dosage has been gradually decreased to 10 mg every 8 hours.  She is on chronic narcotics at home.  ? ?Recent right pubic rami fracture: Supportive care at this point. ? ?Hypokalemia.  Replaced.    ? ?Constipation.  Resolved after bowel regimen continue. ? ?Palliative care: She wishes to be full code decisions to be made by husband according to the patient. ? ?Chronic pain.  On home dose narcotics.  Avoid overdose.  In no distress whatsoever.  Some narcotic seeking behavior  ? ?DM-2 (A1c 7.5 on 3/22): CBGs relatively stable-continue SSI ? ?CBG (last 3)  ?Recent Labs  ?  08/01/21 ?1613 08/01/21 ?2212 08/02/21 ?[id number]  ?[place] ? ? ?Nutrition Status: ?Nutrition Problem: Inadequate oral intake ?Etiology: inability to eat ?Signs/Symptoms: NPO status ?Interventions: Ensure Enlive (each supplement provides 350kcal and 20 grams of protein), MVI ? ?Obesity ?Estimated body mass index is 35.29 kg/m? as calculated from the following: ?  Height as of this encounter: 5\' 5"  (1.651 m). ?  Weight as of this encounter: 96.2 kg.  ? ?Code status: ?  Code Status: Full Code  ? ?DVT Prophylaxis: Prophylactic Lovenox ? ?Family Communication: Spouse-[phone] called-no response on 4/21. ? ?Disposition Plan: ?Status is: Inpatient ?Remains inpatient appropriate because: Resolving septic shock-encephalopathy-plan is now for SNF-but per social work-can only be placed in SNF 1 month after trach placed.   ?  ?Planned Discharge Destination: SNF. ? ? ?Diet: ?Diet Order   ? ?       ?  DIET DYS 3 Room service appropriate? Yes with Assist; Fluid consistency: Thin  Diet effective now       ?  ? ?  ?  ? ?  ?  ?MEDICATIONS: ?Scheduled Meds: ? arformoterol  15 mcg Nebulization BID  ? aspirin  81 mg Oral Daily  ? atorvastatin  40 mg Oral Daily  ? budesonide (PULMICORT) nebulizer solution  0.5 mg Nebulization BID  ? chlorhexidine  15 mL Mouth Rinse BID  ? Chlorhexidine Gluconate Cloth  6 each Topical Q0600  ? clonazepam  0.25 mg Oral BID  ? docusate sodium  200 mg Oral BID  ?  DULoxetine  20 mg Oral Daily  ? enoxaparin (LOVENOX) injection  50 mg Subcutaneous Q24H  ? feeding supplement  237 mL Oral BID BM  ? gabapentin  300 mg  Oral Q8H  ? guaiFENesin  30 mL Oral Q6H  ? insulin aspart  0-9 Units Subcutaneous TID WC  ? mouth rinse  15 mL Mouth Rinse q12n4p  ? metoprolol tartrate  25 mg Oral BID  ? multivitamin with minerals  1 tablet O

## 2021-08-02 NOTE — TOC Progression Note (Signed)
Transition of Care (TOC) - Progression Note  ? ? ?Patient Details  ?Name: Gina Bradley ?MRN: [id number] ?Date of Birth: 1961/04/02 ? ?Transition of Care (TOC) CM/SW Contact  ?Dray Dente Aris Lot, LCSW ?Phone Number: ?08/02/2021, 1:23 PM ? ?Clinical Narrative:    ? ?CSW provided SNF offers to pt. She chooses [facility]). CSW explains insurance auth process.Pt aware of potential DC tomorrow. CSW left message with Eunice Blase at [facility] requesting confirmation of offer; left message. Debbie called back and left CSW message notifying they can take pt tomorrow. CSW notified Eunice Blase that he would start auth and plan for DC tomorrow.  ? ?CSW submitted SNF auth in [facility] portal; auth pending.  ? ?Expected Discharge Plan: Skilled Nursing Facility ?Barriers to Discharge: Insurance Authorization ? ?Expected Discharge Plan and Services ?Expected Discharge Plan: Skilled Nursing Facility ?In-house Referral: Clinical Social Work ?Discharge Planning Services: CM Consult ?Post Acute Care Choice: Skilled Nursing Facility ?Living arrangements for the past 2 months: Single Family Home ?                ?  ?  ?  ?  ?  ?  ?  ?  ?  ?  ? ? ?Social Determinants of Health (SDOH) Interventions ?  ? ?Readmission Risk Interventions ? ?  07/30/2021  ?  5:17 PM  ?Readmission Risk Prevention Plan  ?Transportation Screening Complete  ?Medication Review Oceanographer) Complete  ?PCP or Specialist appointment within 3-5 days of discharge Complete  ?HRI or Home Care Consult Complete  ?SW Recovery Care/Counseling Consult Complete  ?Palliative Care Screening Complete  ?Skilled Nursing Facility Complete  ? ? ?

## 2021-08-02 NOTE — Progress Notes (Signed)
Nutrition Follow-up ? ?DOCUMENTATION CODES:  ? ?Obesity unspecified ? ?INTERVENTION:  ? ?Encourage good PO intake  ?Continue Multivitamin w/ minerals daily ?Continue Ensure Enlive po BID, each supplement provides 350 kcal and 20 grams of protein. ?  ?NUTRITION DIAGNOSIS:  ? ?Inadequate oral intake related to inability to eat as evidenced by NPO status. - Progressing, now on a diet ? ?GOAL:  ? ?Patient will meet greater than or equal to 90% of their needs - Ongoing ? ?MONITOR:  ? ?PO intake, Supplement acceptance, Labs ? ?REASON FOR ASSESSMENT:  ? ?Ventilator ?  ? ?ASSESSMENT:  ? ?61 year old female with PMH of T2DM, CHF, COPD, GERD, chronic lymphedema who presented with AMS and required intubation. ? ?03/24 - transferred to [facility] ?03/28 - s/p tracheostomy ?03/29 - Cortrak placed (tip gastric) ?04/17 - MBS w/ SLP, recommend NPO ?04/18 - MBS w/ SLP, recommend Dysphagia 1, thin liquids ?04/20 - Cortrak removed  ?04/23 - diet advanced to Dysphagia 3, think liquids ?04/27 - decannulated  ? ?Pt eating lunch at time of RD visit. States that she was unsure of what she had for breakfast. States that she typically has eggs or cereal.  ?Per EMR, pt intake includes: ?4/27: Breakfast 25%, Lunch 50% ?4/29: Breakfast 25%, Lunch 75% ? ?Per Meds History, pt has accepted 100% of Ensure's offered. Pt states that she loves the shakes and knows that they are full of vitamins.  ? ?Per TOC note, plan for d/c to SNF tomorrow.  ? ?Medications reviewed and include: Colace, SSI 0-9 units TID, MVI, Protonix, Miralax, Prednisone ?Labs reviewed: 24 hr CBG 138-179 ? ?Diet Order:   ?Diet Order   ? ?       ?  DIET DYS 3 Room service appropriate? Yes with Assist; Fluid consistency: Thin  Diet effective now       ?  ? ?  ?  ? ?  ? ?EDUCATION NEEDS:  ? ?Not appropriate for education at this time ? ?Skin:  Skin Assessment: Reviewed RN Assessment ? ?Last BM:  4/29 - Type 4 ? ?Height:  ?Ht Readings from Last 1 Encounters:  ?06/30/21 5\' 5"  (1.651 m)   ? ?Weight: ?Wt Readings from Last 1 Encounters:  ?08/01/21 96.2 kg  ? ?Ideal Body Weight:  56.8 kg ? ?BMI:  Body mass index is 35.29 kg/m?. ? ?Estimated Nutritional Needs:  ? ?Kcal:  1900-2100 ? ?Protein:  95-110 grams ? ?Fluid:  >/= 1.9 L ? ? ?08/03/21 RD, LDN ?Clinical Dietitian ?See AMiON for contact information.  ?

## 2021-08-03 LAB — GLUCOSE, CAPILLARY
Glucose-Capillary: 106 mg/dL — ABNORMAL HIGH (ref 70–99)
Glucose-Capillary: 208 mg/dL — ABNORMAL HIGH (ref 70–99)
Glucose-Capillary: 183 mg/dL — ABNORMAL HIGH (ref 70–99)
Glucose-Capillary: 168 mg/dL — ABNORMAL HIGH (ref 70–99)

## 2021-08-03 NOTE — Progress Notes (Signed)
Occupational Therapy Treatment ?Patient Details ?Name: Gina Bradley ?MRN: [id number] ?DOB: 1961/03/20 ?Today's Date: 08/03/2021 ? ? ?History of present illness 61 yo female former smoker presented to [facility] ER on 3/21 with AMS, fever.  Found to have multifocal pneumonia and required intubation and pressors.  Transferred to [facility] for further management. ETT 3/21; trach 3/28 secondary to failure to wean; TC trials 4/8. Decannulated 4/27.   Dx acute metabolic encephalopathy, pmhx peripheral neuropathy, HF, HTN, DM2. ?  ?OT comments ? Chart reviewed to date, nurse cleared pt for participation in OT tx session. Pt is alert, oriented to self, place, not oriented to date and grossly oriented to situation. Pt repeatedly stating she was going home on this date. Tx session targeted progressing functional endurance and strength in stetting of ADLs tasks to facilitate return to PLOF. Pt requesting to use bathroom, performed short amb transfer with RW to bedside commode with MIN-MOD A and step by step vcs. MAX A required for LB dressing and peri care. Improvements noted in decreased physical assist required for ADL/mobility however pt continues to perform below PLOF and will continue to benefit from skilled OT to address deficits. Cognition also remains below PLOF. Pt is left in bedside chair +chair alarm, NAD, all needs met. OT will continue to follow.   ? ?Recommendations for follow up therapy are one component of a multi-disciplinary discharge planning process, led by the attending physician.  Recommendations may be updated based on patient status, additional functional criteria and insurance authorization. ?   ?Follow Up Recommendations ? Skilled nursing-short term rehab (<3 hours/day)  ?  ?Assistance Recommended at Discharge Frequent or constant Supervision/Assistance  ?Patient can return home with the following ? A lot of help with walking and/or transfers;A lot of help with bathing/dressing/bathroom;Direct supervision/assist  for medications management;Help with stairs or ramp for entrance;Assistance with cooking/housework;Assist for transportation;Direct supervision/assist for financial management ?  ?Equipment Recommendations ? Other (comment) (per next venue of care)  ?  ?Recommendations for Other Services   ? ?  ?Precautions / Restrictions Precautions ?Precautions: Fall ?Precaution Comments: hx of fall with L4 compression fx ?Restrictions ?Weight Bearing Restrictions: No  ? ? ?  ? ?Mobility Bed Mobility ?  ?  ?  ?  ?  ?  ?  ?General bed mobility comments: NT in chair upon arrival and at end of session ?  ? ?Transfers ?Overall transfer level: Needs assistance ?Equipment used: Rolling walker (2 wheels) ?Transfers: Sit to/from Stand ?Sit to Stand: Min assist, Mod assist ?  ?  ?  ?  ?  ?  ?  ?  ?Balance Overall balance assessment: Needs assistance ?Sitting-balance support: Feet supported ?Sitting balance-Leahy Scale: Fair ?  ?  ?Standing balance support: Bilateral upper extremity supported, During functional activity, Reliant on assistive device for balance ?Standing balance-Leahy Scale: Poor ?  ?  ?  ?  ?  ?  ?  ?  ?  ?  ?  ?  ?   ? ?ADL either performed or assessed with clinical judgement  ? ?ADL Overall ADL's : Needs assistance/impaired ?  ?  ?  ?  ?  ?  ?Lower Body Bathing: Maximal assistance;Sit to/from stand ?  ?  ?  ?  ?  ?Toilet Transfer: Moderate assistance;BSC/3in1;Stand-pivot;Cueing for safety;Cueing for sequencing;Rolling walker (2 wheels);Minimal assistance ?Toilet Transfer Details (indicate cue type and reason): step by step vcs; short amb transfer from chair<>BSC ?Toileting- Clothing Manipulation and Hygiene: Maximal assistance;Sit to/from stand ?Toileting - Water quality scientist  Details (indicate cue type and reason): step by step vcs ?  ?  ?  ?  ?  ? ?Extremity/Trunk Assessment   ?  ?  ?  ?  ?  ? ?Vision   ?  ?  ?Perception   ?  ?Praxis   ?  ? ?Cognition Arousal/Alertness: Awake/alert ?Behavior During Therapy: [facility] for  tasks assessed/performed ?Overall Cognitive Status: Impaired/Different from baseline ?Area of Impairment: Memory, Safety/judgement, Awareness, Attention, Following commands, Problem solving, Orientation ?  ?  ?  ?  ?  ?  ?  ?  ?Orientation Level: Disoriented to, Time ?Current Attention Level: Sustained ?Memory: Decreased short-term memory ?Following Commands: Follows multi-step commands inconsistently ?Safety/Judgement: Decreased awareness of safety, Decreased awareness of deficits ?Awareness: Intellectual ?Problem Solving: Requires tactile cues, Requires verbal cues, Decreased initiation, Slow processing, Difficulty sequencing ?General Comments: step by step vcs for attention to all tasks, frequent re direction required throughout all tasks ?  ?  ?   ?Exercises   ? ?  ?Shoulder Instructions   ? ? ?  ?General Comments spo2 on 2L via [place] >90% throughout  ? ? ?Pertinent Vitals/ Pain       Pain Assessment ?Pain Assessment: Faces ?Faces Pain Scale: Hurts little more ?Pain Location: generalized ?Pain Descriptors / Indicators: Grimacing, Guarding ?Pain Intervention(s): Limited activity within patient's tolerance, Monitored during session, Repositioned ? ?Home Living   ?  ?  ?  ?  ?  ?  ?  ?  ?  ?  ?  ?  ?  ?  ?  ?  ?  ?  ? ?  ?Prior Functioning/Environment    ?  ?  ?  ?   ? ?Frequency ? Min 2X/week  ? ? ? ? ?  ?Progress Toward Goals ? ?OT Goals(current goals can now be found in the care plan section) ? Progress towards OT goals: Progressing toward goals ? ?Acute Rehab OT Goals ?Patient Stated Goal: go home ?OT Goal Formulation: With patient ?Time For Goal Achievement: 08/17/21 ?Potential to Achieve Goals: Good  ?Plan Discharge plan remains appropriate;Frequency remains appropriate   ? ?Co-evaluation ? ? ?   ?  ?  ?  ?  ? ?  ?AM-PAC OT "6 Clicks" Daily Activity     ?Outcome Measure ? ? Help from another person eating meals?: A Little ?Help from another person taking care of personal grooming?: A Little ?Help from another  person toileting, which includes using toliet, bedpan, or urinal?: A Lot ?Help from another person bathing (including washing, rinsing, drying)?: A Lot ?Help from another person to put on and taking off regular upper body clothing?: A Little ?Help from another person to put on and taking off regular lower body clothing?: A Lot ?6 Click Score: 15 ? ?  ?End of Session Equipment Utilized During Treatment: Oxygen;Rolling walker (2 wheels) ? ?OT Visit Diagnosis: Other abnormalities of gait and mobility (R26.89);Muscle weakness (generalized) (M62.81);Other symptoms and signs involving cognitive function ?  ?Activity Tolerance Patient tolerated treatment well ?  ?Patient Left in chair;with call bell/phone within reach;with chair alarm set ?  ?Nurse Communication Mobility status ?  ? ?   ? ?Time: 7225-7505 ?OT Time Calculation (min): 18 min ? ?Charges: OT General Charges ?$OT Visit: 1 Visit ?OT Treatments ?$Self Care/Home Management : 8-22 mins ? ?Shanon Payor, OTD OTR/L  ?08/03/21, 2:12 PM  ?

## 2021-08-03 NOTE — Progress Notes (Signed)
Physical Therapy Treatment ?Patient Details ?Name: Gina Bradley ?MRN: [id number] ?DOB: [birth year] ?Today's Date: 08/03/2021 ? ? ?History of Present Illness 61 yo female former smoker presented to [facility] ER on 3/21 with AMS, fever.  Found to have multifocal pneumonia and required intubation and pressors.  Transferred to [facility] for further management. ETT 3/21; trach 3/28 secondary to failure to wean; TC trials 4/8. Decannulated 4/27.   Dx acute metabolic encephalopathy, pmhx peripheral neuropathy, HF, HTN, DM2. ? ?  ?PT Comments  ? ? Pt admitted with above diagnosis. Pt able to incr distance today with ambulation making good progress.  Pt still requiring +2 assist for safety.  Pt asking lots of questions about her hospital stay and recalling all that she has been through. Pt in good spirits and looking forward to going to her continued Rehab.   Pt currently with functional limitations due to balance and endurance deficits.  Pt will benefit from skilled PT to increase their independence and safety with mobility to allow discharge to the venue listed below.      ?Recommendations for follow up therapy are one component of a multi-disciplinary discharge planning process, led by the attending physician.  Recommendations may be updated based on patient status, additional functional criteria and insurance authorization. ? ?Follow Up Recommendations ? Skilled nursing-short term rehab (<3 hours/day) ?  ?  ?Assistance Recommended at Discharge Frequent or constant Supervision/Assistance  ?Patient can return home with the following Two people to help with walking and/or transfers;A lot of help with bathing/dressing/bathroom;Assistance with cooking/housework;Assistance with feeding;Direct supervision/assist for medications management;Direct supervision/assist for financial management;Assist for transportation;Help with stairs or ramp for entrance ?  ?Equipment Recommendations ? BSC/3in1;Wheelchair (measurements PT);Wheelchair cushion  (measurements PT);Hospital bed;Other (comment)  ?  ?Recommendations for Other Services   ? ? ?  ?Precautions / Restrictions Precautions ?Precautions: Fall ?Precaution Comments: hx of fall with L4 compression fx ?Restrictions ?Weight Bearing Restrictions: No  ?  ? ?Mobility ? Bed Mobility ?  ?  ?  ?  ?  ?  ?  ?General bed mobility comments: Pt in chair on arrival ?  ? ?Transfers ?Overall transfer level: Needs assistance ?Equipment used: Rolling walker (2 wheels) ?Transfers: Sit to/from Stand, Bed to chair/wheelchair/BSC ?Sit to Stand: Min assist, +2 safety/equipment ?  ?  ?  ?  ?  ?General transfer comment: Pt completed sit to stand to  RW needing steadying assist. cues for hand placement ?  ? ?Ambulation/Gait ?Ambulation/Gait assistance: Min assist, +2 safety/equipment ?Gait Distance (Feet): 30 Feet ?Assistive device: Rolling walker (2 wheels) ?Gait Pattern/deviations: Step-to pattern, Decreased stride length, Wide base of support, Trunk flexed, Drifts right/left ?  ?Gait velocity interpretation: <1.31 ft/sec, indicative of household ambulator ?  ?General Gait Details: support for advancing RW and balance as well as to steady at times. Pt also needed cues to stay close to RW.  2LO2 at rest with sats 93%.  +dyspnea with ambulation with sats 90% on 3LO2 to maintain sats, other VSS.  Pt somewhat shaky at end of walk.  Fatigues quickly. Followed pt with chair. ? ? ?Stairs ?  ?  ?  ?  ?  ? ? ?Wheelchair Mobility ?  ? ?Modified Rankin (Stroke Patients Only) ?  ? ? ?  ?Balance   ?  ?  ?  ?  ?Standing balance support: Bilateral upper extremity supported, During functional activity, Reliant on assistive device for balance ?Standing balance-Leahy Scale: Poor ?Standing balance comment: Pt needs UE support on the RW as  well as external support of 1 with chair follow for safety with dynamic activity. ?  ?  ?  ?  ?  ?  ?  ?  ?  ?  ?  ?  ? ?  ?Cognition Arousal/Alertness: Awake/alert ?Behavior During Therapy: [facility] for tasks  assessed/performed ?Overall Cognitive Status: Impaired/Different from baseline ?Area of Impairment: Memory, Safety/judgement, Awareness, Attention, Following commands, Problem solving ?  ?  ?  ?  ?  ?  ?  ?  ?Orientation Level: Disoriented to, Time ?Current Attention Level: Sustained ?Memory: Decreased short-term memory ?Following Commands: Follows multi-step commands inconsistently ?Safety/Judgement: Decreased awareness of safety, Decreased awareness of deficits ?Awareness: Intellectual ?Problem Solving: Requires tactile cues, Requires verbal cues, Decreased initiation, Slow processing, Difficulty sequencing ?General Comments: Pt needs mod to max instructional cueing for sustained attention to tasks.  Increased internal distractions at times with sequencing. ?  ?  ? ?  ?Exercises General Exercises - Upper Extremity ?Shoulder Flexion: AROM, AAROM, Both, 10 reps, Seated ?General Exercises - Lower Extremity ?Ankle Circles/Pumps: AROM, Both, 10 reps ?Long Arc Quad: AROM, Both, Seated, 15 reps ?Hip Flexion/Marching: AROM, Both, 10 reps, Seated ? ?  ?General Comments   ?  ?  ? ?Pertinent Vitals/Pain Pain Assessment ?Pain Assessment: Faces ?Faces Pain Scale: Hurts little more ?Breathing: normal ?Negative Vocalization: none ?Facial Expression: smiling or inexpressive ?Body Language: relaxed ?Consolability: no need to console ?PAINAD Score: 0 ?Pain Location: generalized ?Pain Descriptors / Indicators: Grimacing, Guarding ?Pain Intervention(s): Limited activity within patient's tolerance, Monitored during session, Repositioned  ? ? ?Home Living   ?  ?  ?  ?  ?  ?  ?  ?  ?  ?   ?  ?Prior Function    ?  ?  ?   ? ?PT Goals (current goals can now be found in the care plan section) Progress towards PT goals: Progressing toward goals ? ?  ?Frequency ? ? ? Min 3X/week ? ? ? ?  ?PT Plan Current plan remains appropriate  ? ? ?Co-evaluation   ?  ?  ?  ?  ? ?  ?AM-PAC PT "6 Clicks" Mobility   ?Outcome Measure ? Help needed turning from  your back to your side while in a flat bed without using bedrails?: None ?Help needed moving from lying on your back to sitting on the side of a flat bed without using bedrails?: None ?Help needed moving to and from a bed to a chair (including a wheelchair)?: A Little ?Help needed standing up from a chair using your arms (e.g., wheelchair or bedside chair)?: Total ?Help needed to walk in hospital room?: Total ?Help needed climbing 3-5 steps with a railing? : Total ?6 Click Score: 14 ? ?  ?End of Session Equipment Utilized During Treatment: Oxygen;Gait belt ?Activity Tolerance: Patient limited by fatigue ?Patient left: in chair;with chair alarm set;with call bell/phone within reach ?Nurse Communication: Mobility status ?PT Visit Diagnosis: Unsteadiness on feet (R26.81);Muscle weakness (generalized) (M62.81);Other abnormalities of gait and mobility (R26.89);History of falling (Z91.81);Difficulty in walking, not elsewhere classified (R26.2) ?Pain - Right/Left: Left ?Pain - part of body: Hip ?  ? ? ?Time: 3419-3790 ?PT Time Calculation (min) (ACUTE ONLY): 21 min ? ?Charges:  $Gait Training: 8-22 mins          ?          ? ?Shyna Duignan M,PT ?Acute Rehab Services ?[phone] ?[phone] (pager)  ? ? ?Bevelyn Buckles ?08/03/2021, 10:15 AM ? ?

## 2021-08-03 NOTE — Progress Notes (Addendum)
Speech Language Pathology Treatment: Dysphagia  ?Patient Details ?Name: Gina Bradley ?MRN: [id number] ?DOB: [birth year] ?Today's Date: 08/03/2021 ?Time: 0086-7619 ?SLP Time Calculation (min) (ACUTE ONLY): 20 min ? ?Assessment / Plan / Recommendation ?Clinical Impression ? Patient seen by SLP for skilled treatment session focused on dysphagia goals. Patient sitting in recliner when SLP arrived with breakfast meal in front of her. She requested some ketchup which SLP retrieved. Her voice sounds very good without any air leakage from healing stoma (decannulated on 4/28). Patient continues with significantly impaired memory, not recalling PMV or trach even when SLP holding up trach and PMV supplies to show patient. She was able to feed self breakfast (Dys 3 solids) but is easily distracted, holding food in her mouth, talking with food in her mouth, and exhibiting delayed mild cough response. Overall, patient apperas to be tolerating Dys 3 solids, thin liquids but would benefit from skilled SLP services at next venue of care for cognition and dysphagia. SLP will continue to follow patient while here on acute however is planned for discharge to SNF, likely today pending insurance authorization. ?  ?HPI HPI: Gina Bradley presented to [facility] ER on 3/21 with AMS, fever.  Found to have multifocal pneumonia and required intubation and pressors.  Transferred to [facility] for further management. ETT 3/21; trach 3/28 secondary to failure to wean; TC trials 4/8. Decannulated on 4/28. Dx acute metabolic encephalopathy, pmhx peripheral neuropathy, HF, HTN, DM2. ?  ?   ?SLP Plan ? Discharge SLP treatment due to (comment) (discharging to SNF) ? ?  ?  ?Recommendations for follow up therapy are one component of a multi-disciplinary discharge planning process, led by the attending physician.  Recommendations may be updated based on patient status, additional functional criteria and insurance authorization. ?  ? ?Recommendations  ?Diet  recommendations: Dysphagia 3 (mechanical soft);Thin liquid ?Liquids provided via: Cup;Straw ?Medication Administration: Whole meds with puree ?Supervision: Intermittent supervision to cue for compensatory strategies ?Compensations: Minimize environmental distractions ?Postural Changes and/or Swallow Maneuvers: Seated upright 90 degrees  ?   ?    ?   ? ? ? ? Oral Care Recommendations: Oral care BID ?Follow Up Recommendations: Skilled nursing-short term rehab (<3 hours/day) ?Assistance recommended at discharge: Frequent or constant Supervision/Assistance ?SLP Visit Diagnosis: Dysphagia, oropharyngeal phase (R13.12) ?Plan: Continue with POC ? ? ? ? ?  ?  ? ? ?Lilianne Nevin, MA, CCC-SLP ?Speech Therapy ? ?

## 2021-08-03 NOTE — Progress Notes (Addendum)
?      ?                 PROGRESS NOTE ? ?      ?PATIENT DETAILS ?Name: Gina Bradley ?Age: 61 y.o. ?Sex: female ?Date of Birth: [birth year] ?Admit Date: 06/22/2021 ?Admitting Physician Lilyan Gilford, DO ?[id number], Lyman Bishop, MD ? ?Brief Summary: ?Patient is a 61 y.o.  female with history of DM-2, HTN, SVT, HFpEF, steroid-dependent COPD, opiate dependence, recent compression fracture at L4/right inferior pubic rami fracture-who presented to [facility] ED with altered mental status, SOB with hypoxia, SVT-she was thought to have septic shock due to PNA-she was subsequently intubated-started on broad-spectrum antibiotics/pressors and managed in the ICU.  She was subsequently transferred to [facility] ICU-she was stabilized-however she had failure to wean and underwent a tracheostomy on 3/28.  While in the ICU-she developed fever requiring ID consultation-ICU delirium/encephalopathy requiring on and off infusion of Precedex.  After stability-she was transferred to [facility] on 4/11-where further hospital course was complicated by persistent encephalopathy and MSSA PNA (sputum cultures on 4/10).  She was managed with supportive care-with significant clearing of her encephalopathy.  See below for further details.   ? ?Significant events: ?3/21>> Admit to [facility], intubated, start pressors, cardiology consult for SVT, start amiodarone ?3/24>> transfer to [facility] ?3/28>> Trach for failure to wean ?4/11>> transfer to [facility] ?4/17>> trach downsized to #6 cuffless ?4/18>> SLP reeval and MBS completed-dysphagia 1 diet started. ?4/19>> eating around 40% of some of her meals-at times able to feed herself. ?4/20>> Cortak tube removed ?4/21>> completely awake/alert ? ?Significant studies: ?3/21>> CT head: No acute abnormalities. ?3/21>> CTA chest: No PE, multifocal pneumonia.  Multiple subacute/chronic rib fractures. ?3/21>> CT abdomen/pelvis: No acute finding-thickened endometrium. ?3/23>> RUQ ultrasound: No gallstones ?3/25>> Echo: EF 65-70% ?3/31>> MRI  brain: No acute intracranial abnormality. ?4/01>> CT chest: Extensive multifocal patchy airspace consolidations. ?4/12>> EEG: No seizures. ? ?Significant microbiology data: ?3/21>> COVID/influenza PCR: Negative ?3/21>> blood culture: No growth ?3/21>> urine culture: No growth ?3/22>> tracheal aspirate: No growth ?3/23>> urine culture: 1000 colonies/mL of Staph aureus ?3/23>> blood culture: Staph hominis (contamination) ?3/27>> tracheal aspirate: No growth ?3/31>> blood culture 1/2: Staph epidermidis (contamination) ?4/01>> tracheal aspirate: No growth ?4/03>> blood culture: No growth ?4/05>> respiratory virus panel: Meta pneumonia virus ?4/10>> tracheal aspirate: MSSA ? ?Antimicrobial therapy: ?Doxycyline 3/22 >> 3/24 ?Ceftriaxone 3/21 >>3/25 ?Azithromycin 3/21 x1 dose ?Vancomycin 3/24 >>3/25; 4/1>> 4/4, 4/10 >>4/13 ?Zosyn 3/25 >> 3/28 ?Cefepime 3/31>> 4/4 ?Diflucan 4/1>> 4/4 ?Cefazolin 4/13>> 4/19. ? ?Procedures: ?3/28>> tracheostomy ? ?Consults: ?ID, PCCM, cardiology ? ?Subjective: Patient in bed, appears comfortable, denies any headache, no fever, no chest pain or pressure, no shortness of breath , no abdominal pain. No new focal weakness. ? ?Objective: ?Vitals: ?Blood pressure 110/88, pulse 92, temperature 97.7 ?F (36.5 ?C), temperature source Oral, resp. rate 17, height 5\' 5"  (1.651 m), weight 95.4 kg, last menstrual period 01/05/2015, SpO2 100 %.  ? ?Exam: ? ?Awake Alert, No new F.N deficits, Normal affect ?[place].AT,PERRAL ?Supple Neck, trach site with occlusive bandage, appears clean  ?Symmetrical Chest wall movement, Good air movement bilaterally, CTAB ?RRR,No Gallops, Rubs or new Murmurs,  ?+ve B.Sounds, Abd Soft, No tenderness,   ?No Cyanosis, Clubbing or edema  ? ? ? ?Assessment/Plan: ? ?Septic shock, acute on chronic hypoxic and hypercapnic respiratory failure due to multifocal aspiration pneumonia requiring tracheostomy in ICU : Sepsis physiology has resolved-clinically improved-leukocytosis still persists  but has now started to downtrend.  Chest pathophysiology has now  resolved, she has completed her antibiotic treatment.  She also had viral infection with metapneumovirus.  She is currently on #6 cuffless trach with trach collar.  Clinically improving.  She was decannulated on 07/29/2021, trach site occlusive bandage appears clean.  Continue to monitor.  Medically stable await SNF bed. ? ?Acute metabolic encephalopathy: Likely multifactorial etiology-due to PNA/ICU delirium/sepsis/polypharmacy.  Deficits much improved with supportive.  ? ?Dysphagia: Due to severe encephalopathy/recent trach-had a cortak tube in place-but with significant improvement in encephalopathy-cortak tubewas discontinued on 4/20.  Per nursing staff-significant improvement in her appetite.  Speech following on oral diet. ? ?Asymptomatic transaminitis: Unclear significance-could have been from sepsis-improved-RUQ ultrasound was without acute abnormalities. ? ?COPD: Continue bronchodilators-on chronic prednisone. ? ?SVT: unsuccessful cardioversion on initial presentation to the ED-subsequently required amiodarone infusion-currently maintaining sinus rhythm-on PO Lopressor dose increased 07/28/21. ? ?Acute on chronic HFpEF,  EF preserved at 65%: Volume status stable-continue Aldactone - PRN Lasix. ? ?Bilateral extremity lymphedema: Continues to have significant lower extremity edema- K rising - DC Aldactone - as required several doses of as needed IV Lasix. ? ?HTN: BP stable-continue Lopressor. ? ?HLD: Continue statin ?  ?GERD: Continue PPI ? ?Normocytic anemia: Due to acute illness-no evidence of blood loss-transfuse if Hb <7 ? ?Endometrial thickening on CT abdomen: Will require outpatient follow-up with GYN. ? ?Opiate dependence: Continue oxycodone-dosage has been gradually decreased to 10 mg every 8 hours.  She is on chronic narcotics at home.  ? ?Recent right pubic rami fracture: Supportive care at this point. ? ?Hypokalemia.  Replaced.    ? ?Constipation.  Resolved after bowel regimen continue. ? ?Palliative care: She wishes to be full code decisions to be made by husband according to the patient. ? ?Chronic pain.  On home dose narcotics.  Avoid overdose.  In no distress whatsoever.  Some narcotic seeking behavior.  ? ?DM-2 (A1c 7.5 on 3/22): CBGs relatively stable-continue SSI. ? ?CBG (last 3)  ?Recent Labs  ?  08/02/21 ?1631 08/02/21 ?2124 08/03/21 ?0749  ?GLUCAP 192* 108* 106*  ? ? ? ?Nutrition Status: ?Nutrition Problem: Inadequate oral intake ?Etiology: inability to eat ?Signs/Symptoms: NPO status ?Interventions: MVI, Ensure Enlive (each supplement provides 350kcal and 20 grams of protein) ? ?Obesity ?Estimated body mass index is 35 kg/m? as calculated from the following: ?  Height as of this encounter: 5\' 5"  (1.651 m). ?  Weight as of this encounter: 95.4 kg.  ? ?Code status: ?  Code Status: Full Code  ? ?DVT Prophylaxis: Prophylactic Lovenox ? ?Family Communication: Spouse-[phone] on 08/03/21 @ 7.34 am - message left ? ?Disposition Plan: ?Status is: Inpatient ?Remains inpatient appropriate because: Resolving septic shock-encephalopathy-plan is now for SNF-but per social work-can only be placed in SNF 1 month after trach placed.   ?  ?Planned Discharge Destination: SNF. ? ? ?Diet: ?Diet Order   ? ?       ?  DIET DYS 3 Room service appropriate? Yes with Assist; Fluid consistency: Thin  Diet effective now       ?  ? ?  ?  ? ?  ?  ?MEDICATIONS: ?Scheduled Meds: ? arformoterol  15 mcg Nebulization BID  ? aspirin  81 mg Oral Daily  ? atorvastatin  40 mg Oral Daily  ? budesonide (PULMICORT) nebulizer solution  0.5 mg Nebulization BID  ? chlorhexidine  15 mL Mouth Rinse BID  ? Chlorhexidine Gluconate Cloth  6 each Topical Q0600  ? clonazepam  0.25 mg Oral BID  ? docusate sodium  200 mg Oral BID  ? DULoxetine  20 mg Oral Daily  ? enoxaparin (LOVENOX) injection  50 mg Subcutaneous Q24H  ? feeding supplement  237 mL Oral BID BM  ? gabapentin   300 mg Oral Q8H  ? guaiFENesin  30 mL Oral Q6H  ? insulin aspart  0-9 Units Subcutaneous TID WC  ? mouth rinse  15 mL Mouth Rinse q12n4p  ? metoprolol tartrate  25 mg Oral BID  ? multivitamin with minerals  1

## 2021-08-03 NOTE — TOC Progression Note (Addendum)
Transition of Care (TOC) - Progression Note  ? ? ?Patient Details  ?Name: Gina Bradley ?MRN: [id number] ?Date of Birth: 07-12-1960 ? ?Transition of Care (TOC) CM/SW Contact  ?Benard Halsted, LCSW ?Phone Number: ?08/03/2021, 8:41 AM ? ?Clinical Narrative:    ?CSW notified that insurance auth has been voided as no updated therapy notes were available. CSW messaged therapy as they are on patient's schedule today and will resubmit insurance authorization when available.  ? ?Update: Insurance auth re-started, Ref# [id number]. ? ? ?Expected Discharge Plan: [place] ?Barriers to Discharge: Insurance Authorization ? ?Expected Discharge Plan and Services ?Expected Discharge Plan: [place] ?In-house Referral: Clinical Social Work ?Discharge Planning Services: CM Consult ?Post Acute Care Choice: [place] ?Living arrangements for the past 2 months: [place] ?                ?  ?  ?  ?  ?  ?  ?  ?  ?  ?  ? ? ?Social Determinants of Health (SDOH) Interventions ?  ? ?Readmission Risk Interventions ? ?  07/30/2021  ?  5:17 PM  ?Readmission Risk Prevention Plan  ?Transportation Screening Complete  ?Medication Review Press photographer) Complete  ?PCP or Specialist appointment within 3-5 days of discharge Complete  ?[place] or Home Care Consult Complete  ?SW Recovery Care/Counseling Consult Complete  ?Palliative Care Screening Complete  ?Skilled Nursing Facility Complete  ? ? ?

## 2021-08-04 LAB — CBC WITH DIFFERENTIAL/PLATELET
Abs Immature Granulocytes: 0.07 10*3/uL (ref 0.00–0.07)
Basophils Absolute: 0.1 10*3/uL (ref 0.0–0.1)
Basophils Relative: 1 %
Eosinophils Absolute: 0.4 10*3/uL (ref 0.0–0.5)
Eosinophils Relative: 5 %
HCT: 30.1 % — ABNORMAL LOW (ref 36.0–46.0)
Hemoglobin: 9.3 g/dL — ABNORMAL LOW (ref 12.0–15.0)
Immature Granulocytes: 1 %
Lymphocytes Relative: 38 %
Lymphs Abs: 3.1 10*3/uL (ref 0.7–4.0)
MCH: 30.1 pg (ref 26.0–34.0)
MCHC: 30.9 g/dL (ref 30.0–36.0)
MCV: 97.4 fL (ref 80.0–100.0)
Monocytes Absolute: 1 10*3/uL (ref 0.1–1.0)
Monocytes Relative: 12 %
Neutro Abs: 3.7 10*3/uL (ref 1.7–7.7)
Neutrophils Relative %: 43 %
Platelets: 287 10*3/uL (ref 150–400)
RBC: 3.09 MIL/uL — ABNORMAL LOW (ref 3.87–5.11)
RDW: 16.4 % — ABNORMAL HIGH (ref 11.5–15.5)
WBC: 8.2 10*3/uL (ref 4.0–10.5)
nRBC: 0 % (ref 0.0–0.2)

## 2021-08-04 LAB — BASIC METABOLIC PANEL
Anion gap: 9 (ref 5–15)
BUN: 6 mg/dL (ref 6–20)
CO2: 30 mmol/L (ref 22–32)
Calcium: 9 mg/dL (ref 8.9–10.3)
Chloride: 98 mmol/L (ref 98–111)
Creatinine, Ser: 0.63 mg/dL (ref 0.44–1.00)
GFR, Estimated: >60 mL/min (ref >60)
Glucose, Bld: 123 mg/dL — ABNORMAL HIGH (ref 70–99)
Potassium: 3.4 mmol/L — ABNORMAL LOW (ref 3.5–5.1)
Sodium: 137 mmol/L (ref 135–145)

## 2021-08-04 LAB — GLUCOSE, CAPILLARY
Glucose-Capillary: 119 mg/dL — ABNORMAL HIGH (ref 70–99)
Glucose-Capillary: 209 mg/dL — ABNORMAL HIGH (ref 70–99)
Glucose-Capillary: 152 mg/dL — ABNORMAL HIGH (ref 70–99)
Glucose-Capillary: 125 mg/dL — ABNORMAL HIGH (ref 70–99)

## 2021-08-04 LAB — MAGNESIUM: Magnesium: 1.8 mg/dL (ref 1.7–2.4)

## 2021-08-04 MED ORDER — POLYETHYLENE GLYCOL 3350 17 G PO PACK: 17.0000 g | PACK | Freq: Two times a day (BID) | ORAL | 0 refills | Status: DC

## 2021-08-04 MED ORDER — MAGNESIUM OXIDE -MG SUPPLEMENT 400 (240 MG) MG PO TABS
400.0000 mg | ORAL_TABLET | Freq: Two times a day (BID) | ORAL | Status: DC
  Administered 2021-08-04 (×2): 400 mg via ORAL
  Filled 2021-08-04 (×2): qty 1

## 2021-08-04 MED ORDER — PREDNISONE 5 MG PO TABS: 5.0000 mg | ORAL_TABLET | Freq: Every day | ORAL | Status: DC

## 2021-08-04 MED ORDER — METOPROLOL TARTRATE 25 MG PO TABS: 25.0000 mg | ORAL_TABLET | Freq: Two times a day (BID) | ORAL | Status: DC

## 2021-08-04 MED ORDER — DULOXETINE HCL 20 MG PO CPEP: 20.0000 mg | ORAL_CAPSULE | Freq: Every day | ORAL | 3 refills | Status: AC

## 2021-08-04 MED ORDER — GABAPENTIN 300 MG PO CAPS: 300.0000 mg | ORAL_CAPSULE | Freq: Three times a day (TID) | ORAL | Status: AC

## 2021-08-04 MED ORDER — OXYCODONE HCL 10 MG PO TABS: 10.0000 mg | ORAL_TABLET | Freq: Four times a day (QID) | ORAL | 0 refills | Status: DC | PRN

## 2021-08-04 MED ORDER — ACETAMINOPHEN 325 MG PO TABS: 650.0000 mg | ORAL_TABLET | ORAL | Status: AC | PRN

## 2021-08-04 MED ORDER — POTASSIUM CHLORIDE CRYS ER 20 MEQ PO TBCR
40.0000 meq | EXTENDED_RELEASE_TABLET | ORAL | Status: AC
  Administered 2021-08-04 (×2): 40 meq via ORAL
  Filled 2021-08-04 (×2): qty 2

## 2021-08-04 MED ORDER — QUETIAPINE FUMARATE 50 MG PO TABS: 50.0000 mg | ORAL_TABLET | Freq: Two times a day (BID) | ORAL | Status: DC

## 2021-08-04 MED ORDER — DOCUSATE SODIUM 100 MG PO CAPS: 200.0000 mg | ORAL_CAPSULE | Freq: Two times a day (BID) | ORAL | 0 refills | Status: DC

## 2021-08-04 MED ORDER — CLONAZEPAM 0.25 MG PO TBDP: 0.2500 mg | ORAL_TABLET | Freq: Two times a day (BID) | ORAL | 0 refills | Status: AC

## 2021-08-04 MED ORDER — CALCIUM CARBONATE ANTACID 500 MG PO CHEW: 1.0000 | CHEWABLE_TABLET | Freq: Three times a day (TID) | ORAL | Status: AC | PRN

## 2021-08-04 MED ORDER — INSULIN ASPART 100 UNIT/ML IJ SOLN: 0.0000 [IU] | Freq: Three times a day (TID) | INTRAMUSCULAR | 11 refills | Status: AC

## 2021-08-04 NOTE — Discharge Summary (Signed)
Physician Discharge Summary  ?Gina Bradley [id number] DOB: Jun 18, 1960 DOA: 06/22/2021 ? ?PCP: Redmond School, MD ? ?Admit date: 06/22/2021 ?Discharge date: 08/04/2021 ? ?Admitted From: Home ?Disposition:  SNF ? ?Recommendations for Outpatient Follow-up:  ?Keep encouraging to use incentive spirometer, wean oxygen as tolerated ?Please obtain BMP/CBC in one week ?Endometrial thickening on CT abdomen: Will require outpatient follow-up with GYN. ? ? ?Discharge Condition:Stable ?CODE STATUS:FULL ?Diet recommendation: Dysphagia 3 with thin liquids  ? ?Brief/Interim Summary: ? ?Patient is a 61 y.o.  female with history of DM-2, HTN, SVT, HFpEF, steroid-dependent COPD, opiate dependence, recent compression fracture at L4/right inferior pubic rami fracture-who presented to [facility] ED with altered mental status, SOB with hypoxia, SVT-she was thought to have septic shock due to PNA-she was subsequently intubated-started on broad-spectrum antibiotics/pressors and managed in the ICU.  She was subsequently transferred to [facility] ICU-she was stabilized-however she had failure to wean and underwent a tracheostomy on 3/28.  While in the ICU-she developed fever requiring ID consultation-ICU delirium/encephalopathy requiring on and off infusion of Precedex.  After stability-she was transferred to [facility] on 4/11-where further hospital course was complicated by persistent encephalopathy and MSSA PNA (sputum cultures on 4/10).  She was managed with supportive care-with significant clearing of her encephalopathy.  See below for further details.   ?  ?Significant events: ?3/21>> Admit to [facility], intubated, start pressors, cardiology consult for SVT, start amiodarone ?3/24>> transfer to [facility] ?3/28>> Trach for failure to wean ?4/11>> transfer to [facility] ?4/17>> trach downsized to #6 cuffless ?4/18>> SLP reeval and MBS completed-dysphagia 1 diet started. ?4/19>> eating around 40% of some of her meals-at times able to feed herself. ?4/20>> Cortak tube  removed ?4/21>> completely awake/alert ?  ?Significant studies: ?3/21>> CT head: No acute abnormalities. ?3/21>> CTA chest: No PE, multifocal pneumonia.  Multiple subacute/chronic rib fractures. ?3/21>> CT abdomen/pelvis: No acute finding-thickened endometrium. ?3/23>> RUQ ultrasound: No gallstones ?3/25>> Echo: EF 65-70% ?3/31>> MRI brain: No acute intracranial abnormality. ?4/01>> CT chest: Extensive multifocal patchy airspace consolidations. ?4/12>> EEG: No seizures. ?  ?Significant microbiology data: ?3/21>> COVID/influenza PCR: Negative ?3/21>> blood culture: No growth ?3/21>> urine culture: No growth ?3/22>> tracheal aspirate: No growth ?3/23>> urine culture: 1000 colonies/mL of Staph aureus ?3/23>> blood culture: Staph hominis (contamination) ?3/27>> tracheal aspirate: No growth ?3/31>> blood culture 1/2: Staph epidermidis (contamination) ?4/01>> tracheal aspirate: No growth ?4/03>> blood culture: No growth ?4/05>> respiratory virus panel: Meta pneumonia virus ?4/10>> tracheal aspirate: MSSA ?  ?Antimicrobial therapy: ?Doxycyline 3/22 >> 3/24 ?Ceftriaxone 3/21 >>3/25 ?Azithromycin 3/21 x1 dose ?Vancomycin 3/24 >>3/25; 4/1>> 4/4, 4/10 >>4/13 ?Zosyn 3/25 >> 3/28 ?Cefepime 3/31>> 4/4 ?Diflucan 4/1>> 4/4 ?Cefazolin 4/13>> 4/19. ?  ?Procedures: ?3/28>> tracheostomy ?  ?Consults: ?ID, PCCM, cardiology ? ?Septic shock, acute on chronic hypoxic and hypercapnic respiratory failure due to multifocal aspiration pneumonia requiring tracheostomy in ICU : Sepsis physiology has resolved-clinically improved-leukocytosis still persists but has now started to downtrend.  Chest pathophysiology has now resolved, she has completed her antibiotic treatment.  She also had viral infection with metapneumovirus.  She is currently on #6 cuffless trach with trach collar.  Clinically improving.  She was decannulated on 07/29/2021, trach site occlusive bandage appears clean.  Continue to monitor.  Medically stable for SNF bed. ?  ?Acute  metabolic encephalopathy: Likely multifactorial etiology-due to PNA/ICU delirium/sepsis/polypharmacy.  Deficits much improved with supportive care. ?- resolved ?  ?Dysphagia: Due to severe encephalopathy/recent trach-had a cortak tube in place-but with significant improvement in encephalopathy-cortak tubewas discontinued on 4/20.  Per nursing staff-significant improvement in her appetite.  Speech following on oral diet. ?-Tolerating dysphagia 3 with thin liquids ?  ?Asymptomatic transaminitis: Unclear significance-could have been from sepsis-improved-RUQ ultrasound was without acute abnormalities. ?  ?COPD: Continue bronchodilators-on chronic prednisone. ?  ?SVT: unsuccessful cardioversion on initial presentation to the ED-subsequently required amiodarone infusion-currently maintaining sinus rhythm-on PO Lopressor dose increased 07/28/21. ?  ?Acute on chronic HFpEF,  EF preserved at 65%: Volume status stable-continue  lasix ?  ?Bilateral extremity lymphedema: Continues to have significant lower extremity edema- K rising - DC Aldactone - as required several doses of as needed IV Lasix.will Dc on po lasix ?  ?HTN: BP stable-continue Lopressor. ?  ?HLD: Continue statin ?  ?GERD: Continue PPI ?  ?Normocytic anemia: Due to acute illness-no evidence of blood loss. ?  ?Endometrial thickening on CT abdomen: Will require outpatient follow-up with GYN. ?  ?Opiate dependence: Continue oxycodone-dosage has been gradually decreased to 10 mg every 8 hours.  She is on chronic narcotics at home.  ?  ?Recent right pubic rami fracture: Supportive care at this point. ?  ?Hypokalemia.  Replaced.   ?  ?Constipation.  Resolved after bowel regimen continue. ?  ?Palliative care: She wishes to be full code decisions to be made by husband according to the patient. ?  ?Chronic pain.  On home dose narcotics.  Avoid overdose.  In no distress whatsoever.  Some narcotic seeking behavior.  ?  ?DM-2 (A1c 7.5 on 3/22): CBGs relatively stable-continue  SSI. ?  ?CBG (last 3)  ?Recent Labs (last 2 labs)   ?     ?Recent Labs  ?  08/02/21 ?[id number] 08/02/21 ?2124 08/03/21 ?0749  ?GLUCAP 192* 108* 106*  ?  ?  ?  ? ?  ?Obesity ?Estimated body mass index is 35 kg/m? as calculated from the following: ?  Height as of this encounter: 5\' 5"  (1.651 m). ?  Weight as of this encounter: 95.4 kg.  ? ?Discharge Diagnoses:  ?Principal Problem: ?  Acute respiratory failure with hypoxia and hypercarbia (HCC) ?Active Problems: ?  Severe sepsis ([place]) ?  Multifocal pneumonia ?  PSVT (paroxysmal supraventricular tachycardia) ([place]) ?  Endometrial thickening on CT scan ?  HTN (hypertension), benign ?  GERD (gastroesophageal reflux disease) ?  Uncontrolled type 2 diabetes mellitus with hyperglycemia, without long-term current use of insulin ([place]) ?  Right subacute Inferior pubic ramus fracture ?  Chronic heart failure with preserved ejection fraction (HFpEF) ([place]) ?  Acute metabolic encephalopathy ?  Opioid dependence ([place]) ?  Obesity, Class III, BMI 40-49.9 (morbid obesity) ([place]) ?  Transaminasemia ?  Sepsis ([place]) ? ? ? ?Discharge Instructions ? ?Discharge Instructions   ? ? Diet - low sodium heart healthy   Complete by: As directed ?  ? Discharge instructions   Complete by: As directed ?  ? Follow with Primary MD Redmond School, MD /SNF physician ? ?Get CBC, CMP, 2 view Chest X ray checked  by Primary MD next visit.  ? ? ?Activity: As tolerated with Full fall precautions use walker/cane & assistance as needed ? ? ?Disposition  ? ? ?Diet: Dysphagia 3 with thin liquid ? ?On your next visit with your primary care physician please Get Medicines reviewed and adjusted. ? ? ?Please request your Prim.MD to go over all Hospital Tests and Procedure/Radiological results at the follow up, please get all Hospital records sent to your Prim MD by signing hospital release before you go home. ? ? ?If you experience  worsening of your admission symptoms, develop shortness of breath, life threatening emergency,  suicidal or homicidal thoughts you must seek medical attention immediately by calling 911 or calling your MD immediately  if symptoms less severe. ? ?You Must read complete instructions/literature along with a

## 2021-08-04 NOTE — Progress Notes (Signed)
Physical Therapy Treatment ?Patient Details ?Name: Gina Bradley ?MRN: [id number] ?DOB: Nov 26, 1960 ?Today's Date: 08/04/2021 ? ? ?History of Present Illness 61 yo female former smoker presented to [facility] ER on 3/21 with AMS, fever.  Found to have multifocal pneumonia and required intubation and pressors.  Transferred to [facility] for further management. ETT 3/21; trach 3/28 secondary to failure to wean; TC trials 4/8. Decannulated 4/27.   Dx acute metabolic encephalopathy, pmhx peripheral neuropathy, HF, HTN, DM2. ? ?  ?PT Comments  ? ? Patient seen for transfer training and therapeutic exercises. Patient pre-occupied with wanting to eat her breakfast, which arrived late, and therefore shorter session. Unable to ambulate due to lack of +2 assist.    ?Recommendations for follow up therapy are one component of a multi-disciplinary discharge planning process, led by the attending physician.  Recommendations may be updated based on patient status, additional functional criteria and insurance authorization. ? ?Follow Up Recommendations ? Skilled nursing-short term rehab (<3 hours/day) ?  ?  ?Assistance Recommended at Discharge Frequent or constant Supervision/Assistance  ?Patient can return home with the following Two people to help with walking and/or transfers;A lot of help with bathing/dressing/bathroom;Assistance with cooking/housework;Assistance with feeding;Direct supervision/assist for medications management;Direct supervision/assist for financial management;Assist for transportation;Help with stairs or ramp for entrance ?  ?Equipment Recommendations ? BSC/3in1;Wheelchair (measurements PT);Wheelchair cushion (measurements PT);Hospital bed;Other (comment)  ?  ?Recommendations for Other Services OT consult ? ? ?  ?Precautions / Restrictions Precautions ?Precautions: Fall ?Precaution Comments: hx of fall with L4 compression fx ?Restrictions ?Weight Bearing Restrictions: No  ?  ? ?Mobility ? Bed Mobility ?  ?  ?  ?  ?  ?  ?   ?General bed mobility comments: sitting EOB with RN in room on arrival ?  ? ?Transfers ?Overall transfer level: Needs assistance ?Equipment used: Rolling walker (2 wheels) ?Transfers: Sit to/from Stand ?Sit to Stand: Min guard, Min assist ?  ?Step pivot transfers: Min assist ?  ?  ?  ?General transfer comment: practiced sit to stand x 3, needing verbal cues for hand placement each time; min assist to sit x 1 due to loss of balance as stepping back towards chair ?  ? ?Ambulation/Gait ?  ?  ?  ?  ?  ?  ?  ?General Gait Details: unable to progress to ambulation due to lack of +2 assist ? ? ?Stairs ?  ?  ?  ?  ?  ? ? ?Wheelchair Mobility ?  ? ?Modified Rankin (Stroke Patients Only) ?  ? ? ?  ?Balance Overall balance assessment: Needs assistance ?Sitting-balance support: Feet supported ?Sitting balance-Leahy Scale: Fair ?Sitting balance - Comments: at EOB did not require UE support to maintain ?  ?Standing balance support: Bilateral upper extremity supported, During functional activity, Reliant on assistive device for balance ?Standing balance-Leahy Scale: Poor ?  ?  ?  ?  ?  ?  ?  ?  ?  ?  ?  ?  ?  ? ?  ?Cognition Arousal/Alertness: Awake/alert ?Behavior During Therapy: Flat affect ?Overall Cognitive Status: Impaired/Different from baseline ?Area of Impairment: Orientation, Attention, Memory, Following commands, Safety/judgement, Awareness, Problem solving ?  ?  ?  ?  ?  ?  ?  ?  ?Orientation Level: Disoriented to, Time ?Current Attention Level: Sustained ?Memory: Decreased short-term memory ?Following Commands: Follows one step commands with increased time, Follows multi-step commands inconsistently ?Safety/Judgement: Decreased awareness of safety, Decreased awareness of deficits ?Awareness: Intellectual ?Problem Solving: Requires verbal cues, Decreased  initiation, Slow processing, Difficulty sequencing ?General Comments: pt needing multiple repetition of hand placement when standing, pt thinks she is 61 years old ?   ?  ? ?  ?Exercises General Exercises - Lower Extremity ?Ankle Circles/Pumps: AROM, Both, 10 reps ?Long Arc Quad: AROM, Both, Seated, 10 reps ?Hip Flexion/Marching: AROM, Both, 10 reps, Seated ? ?  ?General Comments   ?  ?  ? ?Pertinent Vitals/Pain Pain Assessment ?Pain Assessment: Faces ?Faces Pain Scale: Hurts little more ?Pain Location: buttocks ?Pain Descriptors / Indicators: Sore, Discomfort ?Pain Intervention(s): Repositioned, Monitored during session  ? ? ?Home Living   ?  ?  ?  ?  ?  ?  ?  ?  ?  ?   ?  ?Prior Function    ?  ?  ?   ? ?PT Goals (current goals can now be found in the care plan section) Acute Rehab PT Goals ?Patient Stated Goal: wants to get stronger ?Time For Goal Achievement: 08/05/21 ?Potential to Achieve Goals: Fair ?Progress towards PT goals: Progressing toward goals ? ?  ?Frequency ? ? ? Min 3X/week ? ? ? ?  ?PT Plan Current plan remains appropriate  ? ? ?Co-evaluation   ?  ?  ?  ?  ? ?  ?AM-PAC PT "6 Clicks" Mobility   ?Outcome Measure ? Help needed turning from your back to your side while in a flat bed without using bedrails?: None ?Help needed moving from lying on your back to sitting on the side of a flat bed without using bedrails?: None ?Help needed moving to and from a bed to a chair (including a wheelchair)?: A Little ?Help needed standing up from a chair using your arms (e.g., wheelchair or bedside chair)?: A Little ?Help needed to walk in hospital room?: Total ?Help needed climbing 3-5 steps with a railing? : Total ?6 Click Score: 16 ? ?  ?End of Session Equipment Utilized During Treatment: Oxygen;Gait belt ?Activity Tolerance: Patient tolerated treatment well ?Patient left: in chair;with chair alarm set;with call bell/phone within reach ?Nurse Communication: Mobility status ?PT Visit Diagnosis: Unsteadiness on feet (R26.81);Muscle weakness (generalized) (M62.81);Other abnormalities of gait and mobility (R26.89);History of falling (Z91.81);Difficulty in walking, not elsewhere  classified (R26.2) ?Pain - Right/Left: Left ?Pain - part of body:  (buttocks) ?  ? ? ?Time: [id number] ?PT Time Calculation (min) (ACUTE ONLY): 18 min ? ?Charges:  $Therapeutic Exercise: 8-22 mins          ?          ? ? ?Arby Barrette, PT ?Acute Rehabilitation Services  ?Pager [phone] ?Office [phone] ? ? ? ?Jeanie Cooks Zenith Lamphier ?08/04/2021, 11:30 AM ? ?

## 2021-08-04 NOTE — Progress Notes (Signed)
Report called to Denyse Amass, Charity fundraiser at [facility]. Patient waiting for transport.  ? ?

## 2021-08-04 NOTE — TOC Transition Note (Addendum)
Transition of Care (TOC) - CM/SW Discharge Note ? ? ?Patient Details  ?Name: Gina Bradley ?MRN: [id number] ?Date of Birth: September 12, 1960 ? ?Transition of Care (TOC) CM/SW Contact:  ?Mearl Latin, LCSW ?Phone Number: ?08/04/2021, 2:04 PM ? ? ?Clinical Narrative:    ?Patient will DC to: [facility] ?Anticipated DC date: 08/04/21 ?Family notified: Pt will notify her family  ?Transport by: Sharin Mons ? ? ?Per MD patient ready for DC to [facility]. RN to call report prior to discharge [phone] room B13). RN, patient, patient's family, and facility notified of DC. Discharge Summary and FL2 sent to facility. DC packet on chart including signed scripts. Ambulance transport requested for patient.  ? ?CSW will sign off for now as social work intervention is no longer needed. Please consult [place] again if new needs arise. ? ? ? ? ?Final next level of care: Skilled Nursing Facility ?Barriers to Discharge: Barriers Resolved ? ? ?Patient Goals and CMS Choice ?Patient states their goals for this hospitalization and ongoing recovery are:: Rehab then home ?CMS Medicare.gov Compare Post Acute Care list provided to:: Patient ?Choice offered to / list presented to : Patient ? ?Discharge Placement ?  ?Existing PASRR number confirmed : 08/04/21          ?Patient chooses bed at: [facility] [place] ?Patient to be transferred to facility by: [facility] ?Name of family member notified: Pt notified family ?Patient and family notified of of transfer: 08/04/21 ? ?Discharge Plan and Services ?In-house Referral: Clinical Social Work ?Discharge Planning Services: CM Consult ?Post Acute Care Choice: Skilled Nursing Facility          ?  ?  ?  ?  ?  ?  ?  ?  ?  ?  ? ?Social Determinants of Health (SDOH) Interventions ?  ? ? ?Readmission Risk Interventions ? ?  07/30/2021  ?  5:17 PM  ?Readmission Risk Prevention Plan  ?Transportation Screening Complete  ?Medication Review Oceanographer) Complete  ?PCP or Specialist appointment within 3-5 days of discharge  Complete  ?HRI or Home Care Consult Complete  ?SW Recovery Care/Counseling Consult Complete  ?Palliative Care Screening Complete  ?Skilled Nursing Facility Complete  ? ? ? ? ? ?

## 2021-08-04 NOTE — TOC Progression Note (Signed)
Transition of Care (TOC) - Progression Note  ? ? ?Patient Details  ?Name: Gina Bradley ?MRN: [id number] ?Date of Birth: 1960/06/20 ? ?Transition of Care (TOC) CM/SW Contact  ?Mearl Latin, LCSW ?Phone Number: ?08/04/2021, 8:55 AM ? ?Clinical Narrative:    ?Received call from [employer] that [employer] approval received for [facility] SNF: Ref# [id number], Berkley Harvey ID# [id number], effective 08/03/21-08/05/21.  ? ?CSW called [employer] back as auth appeared to state Void in the online portal. [employer] clarified that the auth given to CSW is correct but there were duplicate submissions in the system so the others were voided.  ? ? ?Expected Discharge Plan: Skilled Nursing Facility ?Barriers to Discharge: Barriers Resolved ? ?Expected Discharge Plan and Services ?Expected Discharge Plan: Skilled Nursing Facility ?In-house Referral: Clinical Social Work ?Discharge Planning Services: CM Consult ?Post Acute Care Choice: Skilled Nursing Facility ?Living arrangements for the past 2 months: Single Family Home ?                ?  ?  ?  ?  ?  ?  ?  ?  ?  ?  ? ? ?Social Determinants of Health (SDOH) Interventions ?  ? ?Readmission Risk Interventions ? ?  07/30/2021  ?  5:17 PM  ?Readmission Risk Prevention Plan  ?Transportation Screening Complete  ?Medication Review Oceanographer) Complete  ?PCP or Specialist appointment within 3-5 days of discharge Complete  ?HRI or Home Care Consult Complete  ?SW Recovery Care/Counseling Consult Complete  ?Palliative Care Screening Complete  ?Skilled Nursing Facility Complete  ? ? ?

## 2021-08-04 NOTE — Discharge Instructions (Signed)
Follow with Primary MD Redmond School, MD /SNF physician ? ?Get CBC, CMP, 2 view Chest X ray checked  by Primary MD next visit.  ? ? ?Activity: As tolerated with Full fall precautions use walker/cane & assistance as needed ? ? ?Disposition  ? ? ?Diet: Dysphagia 3 with thin liquid ? ?On your next visit with your primary care physician please Get Medicines reviewed and adjusted. ? ? ?Please request your Prim.MD to go over all Hospital Tests and Procedure/Radiological results at the follow up, please get all Hospital records sent to your Prim MD by signing hospital release before you go home. ? ? ?If you experience worsening of your admission symptoms, develop shortness of breath, life threatening emergency, suicidal or homicidal thoughts you must seek medical attention immediately by calling 911 or calling your MD immediately  if symptoms less severe. ? ?You Must read complete instructions/literature along with all the possible adverse reactions/side effects for all the Medicines you take and that have been prescribed to you. Take any new Medicines after you have completely understood and accpet all the possible adverse reactions/side effects.  ? ?Do not drive, operating heavy machinery, perform activities at heights, swimming or participation in water activities or provide baby sitting services if your were admitted for syncope or siezures until you have seen by Primary MD or a Neurologist and advised to do so again. ? ?Do not drive when taking Pain medications.  ? ? ?Do not take more than prescribed Pain, Sleep and Anxiety Medications ? ?Special Instructions: If you have smoked or chewed Tobacco  in the last 2 yrs please stop smoking, stop any regular Alcohol  and or any Recreational drug use. ? ?Wear Seat belts while driving. ? ? ?Please note ? ?You were cared for by a hospitalist during your hospital stay. If you have any questions about your discharge medications or the care you received while you were in the  hospital after you are discharged, you can call the unit and asked to speak with the hospitalist on call if the hospitalist that took care of you is not available. Once you are discharged, your primary care physician will handle any further medical issues. Please note that NO REFILLS for any discharge medications will be authorized once you are discharged, as it is imperative that you return to your primary care physician (or establish a relationship with a primary care physician if you do not have one) for your aftercare needs so that they can reassess your need for medications and monitor your lab values.  ?

## 2021-08-04 NOTE — Progress Notes (Signed)
Occupational Therapy Treatment ?Patient Details ?Name: Gina Bradley ?MRN: [id number] ?DOB: [birth year] ?Today's Date: 08/04/2021 ? ? ?History of present illness 61 yo female former smoker presented to [facility] ER on 3/21 with AMS, fever.  Found to have multifocal pneumonia and required intubation and pressors.  Transferred to [facility] for further management. ETT 3/21; trach 3/28 secondary to failure to wean; TC trials 4/8. Decannulated 4/27.   Dx acute metabolic encephalopathy, pmhx peripheral neuropathy, HF, HTN, DM2. ?  ?OT comments ? Pt continues to demonstrate significant cognitive impairment. Needing multiple cues to generalize hand placement with walker use and assist to order breakfast (was aware she had not eaten). Appears somewhat aware she is not at her baseline cognitively.  Pt completed seated grooming with set up to mod assist at EOB, Toileted on BSC with min assist and returned to bed with assist for LEs. Pt with c/o pain in buttocks, relieved with multiple standing trials and top of L foot. VSS on 2L 02.  ? ?Recommendations for follow up therapy are one component of a multi-disciplinary discharge planning process, led by the attending physician.  Recommendations may be updated based on patient status, additional functional criteria and insurance authorization. ?   ?Follow Up Recommendations ? Skilled nursing-short term rehab (<3 hours/day)  ?  ?Assistance Recommended at Discharge Frequent or constant Supervision/Assistance  ?Patient can return home with the following ? A little help with walking and/or transfers;A lot of help with bathing/dressing/bathroom;Direct supervision/assist for financial management;Direct supervision/assist for medications management;Assist for transportation;Help with stairs or ramp for entrance;Assistance with cooking/housework ?  ?Equipment Recommendations ? Other (comment) (defer to next venue)  ?  ?Recommendations for Other Services   ? ?  ?Precautions / Restrictions  Precautions ?Precautions: Fall ?Precaution Comments: hx of fall with L4 compression fx  ? ? ?  ? ?Mobility Bed Mobility ?Overal bed mobility: Needs Assistance ?Bed Mobility: Supine to Sit, Sit to Supine ?  ?  ?Supine to sit: Supervision, HOB elevated ?Sit to supine: Mod assist ?  ?General bed mobility comments: assisted LEs back into bed ?  ? ?Transfers ?Overall transfer level: Needs assistance ?Equipment used: Rolling walker (2 wheels) ?Transfers: Sit to/from Stand ?Sit to Stand: Min guard, Min assist ?  ?  ?Step pivot transfers: Min assist ?  ?  ?General transfer comment: practiced sit to stand x 3, needing verbal cues for hand placement each time ?  ?  ?Balance Overall balance assessment: Needs assistance ?Sitting-balance support: Feet supported ?Sitting balance-Leahy Scale: Fair ?  ?  ?Standing balance support: Bilateral upper extremity supported, During functional activity, Reliant on assistive device for balance ?Standing balance-Leahy Scale: Poor ?  ?  ?  ?  ?  ?  ?  ?  ?  ?  ?  ?  ?   ? ?ADL either performed or assessed with clinical judgement  ? ?ADL Overall ADL's : Needs assistance/impaired ?Eating/Feeding: Bed level;Set up ?  ?Grooming: Wash/dry hands;Wash/dry face;Brushing hair;Sitting;Minimal assistance ?  ?  ?  ?  ?  ?Upper Body Dressing : Minimal assistance;Sitting ?  ?  ?Lower Body Dressing Details (indicate cue type and reason): can doff sock in long sitting ?Toilet Transfer: Minimal assistance;Stand-pivot;Rolling walker (2 wheels);BSC/3in1 ?  ?Toileting- Clothing Manipulation and Hygiene: Minimal assistance;Sit to/from stand ?  ?  ?  ?  ?General ADL Comments: 2L via  ?  ? ?Extremity/Trunk Assessment   ?  ?  ?  ?  ?  ? ?Vision   ?  ?  ?  Perception   ?  ?Praxis   ?  ? ?Cognition Arousal/Alertness: Awake/alert ?Behavior During Therapy: Flat affect ?Overall Cognitive Status: Impaired/Different from baseline ?Area of Impairment: Orientation, Attention, Memory, Following commands, Safety/judgement,  Awareness, Problem solving ?  ?  ?  ?  ?  ?  ?  ?  ?Orientation Level: Disoriented to, Time ?Current Attention Level: Sustained ?Memory: Decreased short-term memory ?Following Commands: Follows one step commands with increased time, Follows multi-step commands inconsistently ?Safety/Judgement: Decreased awareness of safety, Decreased awareness of deficits ?Awareness: Intellectual ?Problem Solving: Requires verbal cues, Decreased initiation, Slow processing, Difficulty sequencing ?General Comments: pt needing multiple repetition of hand placement when standing, pt thinks she is 61 years old ?  ?  ?   ?Exercises   ? ?  ?Shoulder Instructions   ? ? ?  ?General Comments    ? ? ?Pertinent Vitals/ Pain       Pain Assessment ?Pain Assessment: Faces ?Faces Pain Scale: Hurts little more ?Pain Location: L foot, buttocks ?Pain Descriptors / Indicators: Sore, Discomfort ?Pain Intervention(s): Monitored during session, Repositioned ? ?Home Living   ?  ?  ?  ?  ?  ?  ?  ?  ?  ?  ?  ?  ?  ?  ?  ?  ?  ?  ? ?  ?Prior Functioning/Environment    ?  ?  ?  ?   ? ?Frequency ? Min 2X/week  ? ? ? ? ?  ?Progress Toward Goals ? ?OT Goals(current goals can now be found in the care plan section) ? Progress towards OT goals: Progressing toward goals ? ?Acute Rehab OT Goals ?OT Goal Formulation: With patient ?Time For Goal Achievement: 08/17/21 ?Potential to Achieve Goals: Good  ?Plan Discharge plan remains appropriate;Frequency remains appropriate   ? ?Co-evaluation ? ? ?   ?  ?  ?  ?  ? ?  ?AM-PAC OT "6 Clicks" Daily Activity     ?Outcome Measure ? ? Help from another person eating meals?: A Little ?Help from another person taking care of personal grooming?: A Little ?Help from another person toileting, which includes using toliet, bedpan, or urinal?: A Little ?Help from another person bathing (including washing, rinsing, drying)?: A Lot ?Help from another person to put on and taking off regular upper body clothing?: A Little ?Help from  another person to put on and taking off regular lower body clothing?: A Lot ?6 Click Score: 16 ? ?  ?End of Session Equipment Utilized During Treatment: Rolling walker (2 wheels);Gait belt;Oxygen ? ?OT Visit Diagnosis: Other abnormalities of gait and mobility (R26.89);Muscle weakness (generalized) (M62.81);Other symptoms and signs involving cognitive function ?  ?Activity Tolerance Patient tolerated treatment well ?  ?Patient Left in bed;with call bell/phone within reach;with bed alarm set ?  ?Nurse Communication   ?  ? ?   ? ?Time: 0920-1006 ?OT Time Calculation (min): 46 min ? ?Charges: OT General Charges ?$OT Visit: 1 Visit ?OT Treatments ?$Self Care/Home Management : 38-52 mins ? ?Martie Round, OTR/L ?Acute Rehabilitation Services ?Pager: [phone] ?Office: [phone]  ? ?Evern Bio ?08/04/2021, 10:16 AM ?

## 2021-08-05 LAB — BASIC METABOLIC PANEL
Anion gap: 7 (ref 5–15)
BUN: 7 mg/dL (ref 6–20)
CO2: 28 mmol/L (ref 22–32)
Calcium: 9.2 mg/dL (ref 8.9–10.3)
Chloride: 102 mmol/L (ref 98–111)
Creatinine, Ser: 0.73 mg/dL (ref 0.44–1.00)
GFR, Estimated: >60 mL/min (ref >60)
Glucose, Bld: 123 mg/dL — ABNORMAL HIGH (ref 70–99)
Potassium: 4.3 mmol/L (ref 3.5–5.1)
Sodium: 137 mmol/L (ref 135–145)

## 2021-08-05 LAB — MAGNESIUM: Magnesium: 1.9 mg/dL (ref 1.7–2.4)

## 2021-09-01 ENCOUNTER — Encounter (HOSPITAL_COMMUNITY): Payer: Self-pay | Admitting: Emergency Medicine

## 2021-09-01 ENCOUNTER — Emergency Department (HOSPITAL_COMMUNITY): Payer: 59

## 2021-09-01 ENCOUNTER — Inpatient Hospital Stay (HOSPITAL_COMMUNITY)
Admission: EM | Admit: 2021-09-01 | Discharge: 2021-09-04 | DRG: 871 | Disposition: A | Discharge status: Home Health | Payer: 59 | Attending: Family Medicine | Admitting: Family Medicine

## 2021-09-01 ENCOUNTER — Observation Stay (HOSPITAL_COMMUNITY): Payer: 59

## 2021-09-01 ENCOUNTER — Other Ambulatory Visit: Payer: Self-pay

## 2021-09-01 DIAGNOSIS — I252 Old myocardial infarction: Secondary | ICD-10-CM

## 2021-09-01 DIAGNOSIS — Z794 Long term (current) use of insulin: Secondary | ICD-10-CM

## 2021-09-01 DIAGNOSIS — E119 Type 2 diabetes mellitus without complications: Secondary | ICD-10-CM | POA: Diagnosis present

## 2021-09-01 DIAGNOSIS — F32A Depression, unspecified: Secondary | ICD-10-CM | POA: Diagnosis present

## 2021-09-01 DIAGNOSIS — R531 Weakness: Principal | ICD-10-CM

## 2021-09-01 DIAGNOSIS — S32501D Unspecified fracture of right pubis, subsequent encounter for fracture with routine healing: Secondary | ICD-10-CM

## 2021-09-01 DIAGNOSIS — Z9981 Dependence on supplemental oxygen: Secondary | ICD-10-CM

## 2021-09-01 DIAGNOSIS — J449 Chronic obstructive pulmonary disease, unspecified: Secondary | ICD-10-CM | POA: Diagnosis present

## 2021-09-01 DIAGNOSIS — D649 Anemia, unspecified: Secondary | ICD-10-CM | POA: Diagnosis not present

## 2021-09-01 DIAGNOSIS — F419 Anxiety disorder, unspecified: Secondary | ICD-10-CM | POA: Diagnosis present

## 2021-09-01 DIAGNOSIS — E871 Hypo-osmolality and hyponatremia: Secondary | ICD-10-CM | POA: Diagnosis present

## 2021-09-01 DIAGNOSIS — G4700 Insomnia, unspecified: Secondary | ICD-10-CM | POA: Diagnosis present

## 2021-09-01 DIAGNOSIS — G2581 Restless legs syndrome: Secondary | ICD-10-CM | POA: Diagnosis present

## 2021-09-01 DIAGNOSIS — E43 Unspecified severe protein-calorie malnutrition: Secondary | ICD-10-CM | POA: Diagnosis present

## 2021-09-01 DIAGNOSIS — Z6838 Body mass index (BMI) 38.0-38.9, adult: Secondary | ICD-10-CM

## 2021-09-01 DIAGNOSIS — S32502D Unspecified fracture of left pubis, subsequent encounter for fracture with routine healing: Secondary | ICD-10-CM

## 2021-09-01 DIAGNOSIS — Z7952 Long term (current) use of systemic steroids: Secondary | ICD-10-CM

## 2021-09-01 DIAGNOSIS — I110 Hypertensive heart disease with heart failure: Secondary | ICD-10-CM | POA: Diagnosis present

## 2021-09-01 DIAGNOSIS — E876 Hypokalemia: Secondary | ICD-10-CM | POA: Diagnosis not present

## 2021-09-01 DIAGNOSIS — N3000 Acute cystitis without hematuria: Secondary | ICD-10-CM

## 2021-09-01 DIAGNOSIS — B359 Dermatophytosis, unspecified: Secondary | ICD-10-CM | POA: Diagnosis present

## 2021-09-01 DIAGNOSIS — I5032 Chronic diastolic (congestive) heart failure: Secondary | ICD-10-CM | POA: Diagnosis present

## 2021-09-01 DIAGNOSIS — N390 Urinary tract infection, site not specified: Secondary | ICD-10-CM | POA: Diagnosis present

## 2021-09-01 DIAGNOSIS — F1120 Opioid dependence, uncomplicated: Secondary | ICD-10-CM | POA: Diagnosis present

## 2021-09-01 DIAGNOSIS — A419 Sepsis, unspecified organism: Secondary | ICD-10-CM | POA: Diagnosis present

## 2021-09-01 DIAGNOSIS — E860 Dehydration: Secondary | ICD-10-CM | POA: Diagnosis present

## 2021-09-01 DIAGNOSIS — I878 Other specified disorders of veins: Secondary | ICD-10-CM | POA: Diagnosis present

## 2021-09-01 DIAGNOSIS — A4151 Sepsis due to Escherichia coli [E. coli]: Secondary | ICD-10-CM | POA: Diagnosis not present

## 2021-09-01 DIAGNOSIS — E878 Other disorders of electrolyte and fluid balance, not elsewhere classified: Secondary | ICD-10-CM | POA: Diagnosis present

## 2021-09-01 DIAGNOSIS — I2510 Atherosclerotic heart disease of native coronary artery without angina pectoris: Secondary | ICD-10-CM | POA: Diagnosis present

## 2021-09-01 DIAGNOSIS — S2243XD Multiple fractures of ribs, bilateral, subsequent encounter for fracture with routine healing: Secondary | ICD-10-CM

## 2021-09-01 DIAGNOSIS — S2249XS Multiple fractures of ribs, unspecified side, sequela: Secondary | ICD-10-CM

## 2021-09-01 DIAGNOSIS — S2249XA Multiple fractures of ribs, unspecified side, initial encounter for closed fracture: Secondary | ICD-10-CM | POA: Diagnosis present

## 2021-09-01 DIAGNOSIS — X58XXXD Exposure to other specified factors, subsequent encounter: Secondary | ICD-10-CM | POA: Diagnosis present

## 2021-09-01 DIAGNOSIS — G4733 Obstructive sleep apnea (adult) (pediatric): Secondary | ICD-10-CM | POA: Diagnosis present

## 2021-09-01 DIAGNOSIS — K219 Gastro-esophageal reflux disease without esophagitis: Secondary | ICD-10-CM | POA: Diagnosis present

## 2021-09-01 HISTORY — DX: Unspecified fracture of sacrum, initial encounter for closed fracture: S32.10XA

## 2021-09-01 HISTORY — DX: Fracture of one rib, unspecified side, initial encounter for closed fracture: S22.39XA

## 2021-09-01 HISTORY — DX: Sleep apnea, unspecified: G47.30

## 2021-09-01 LAB — CBC WITH DIFFERENTIAL/PLATELET
Abs Immature Granulocytes: 0.17 10*3/uL — ABNORMAL HIGH (ref 0.00–0.07)
Basophils Absolute: 0 10*3/uL (ref 0.0–0.1)
Basophils Relative: 0 %
Eosinophils Absolute: 0 10*3/uL (ref 0.0–0.5)
Eosinophils Relative: 0 %
HCT: 34.9 % — ABNORMAL LOW (ref 36.0–46.0)
Hemoglobin: 11.3 g/dL — ABNORMAL LOW (ref 12.0–15.0)
Immature Granulocytes: 1 %
Lymphocytes Relative: 6 %
Lymphs Abs: 0.9 10*3/uL (ref 0.7–4.0)
MCH: 27.6 pg (ref 26.0–34.0)
MCHC: 32.4 g/dL (ref 30.0–36.0)
MCV: 85.3 fL (ref 80.0–100.0)
Monocytes Absolute: 0.8 10*3/uL (ref 0.1–1.0)
Monocytes Relative: 5 %
Neutro Abs: 13.6 10*3/uL — ABNORMAL HIGH (ref 1.7–7.7)
Neutrophils Relative %: 88 %
Platelets: 303 10*3/uL (ref 150–400)
RBC: 4.09 MIL/uL (ref 3.87–5.11)
RDW: 15.2 % (ref 11.5–15.5)
WBC: 15.6 10*3/uL — ABNORMAL HIGH (ref 4.0–10.5)
nRBC: 0 % (ref 0.0–0.2)

## 2021-09-01 LAB — URINALYSIS, ROUTINE W REFLEX MICROSCOPIC
Bilirubin Urine: NEGATIVE
Glucose, UA: NEGATIVE mg/dL
Ketones, ur: 5 mg/dL — AB
Nitrite: NEGATIVE
Protein, ur: NEGATIVE mg/dL
Specific Gravity, Urine: 1.003 — ABNORMAL LOW (ref 1.005–1.030)
pH: 7 (ref 5.0–8.0)

## 2021-09-01 LAB — TROPONIN I (HIGH SENSITIVITY)
Troponin I (High Sensitivity): 9 ng/L (ref <18)
Troponin I (High Sensitivity): <2 ng/L (ref <18)

## 2021-09-01 LAB — BLOOD GAS, VENOUS
Acid-Base Excess: 10.7 mmol/L — ABNORMAL HIGH (ref 0.0–2.0)
Bicarbonate: 35.7 mmol/L — ABNORMAL HIGH (ref 20.0–28.0)
Drawn by: 41517
FIO2: 28 %
O2 Saturation: 63.2 %
Patient temperature: 37
pCO2, Ven: 48 mmHg (ref 44–60)
pH, Ven: 7.48 — ABNORMAL HIGH (ref 7.25–7.43)
pO2, Ven: 36 mmHg (ref 32–45)

## 2021-09-01 LAB — PROTIME-INR
INR: 1 (ref 0.8–1.2)
Prothrombin Time: 13.4 seconds (ref 11.4–15.2)

## 2021-09-01 LAB — COMPREHENSIVE METABOLIC PANEL
ALT: 17 U/L (ref 0–44)
AST: 19 U/L (ref 15–41)
Albumin: 2.6 g/dL — ABNORMAL LOW (ref 3.5–5.0)
Alkaline Phosphatase: 96 U/L (ref 38–126)
Anion gap: 12 (ref 5–15)
BUN: 10 mg/dL (ref 6–20)
CO2: 32 mmol/L (ref 22–32)
Calcium: 8.1 mg/dL — ABNORMAL LOW (ref 8.9–10.3)
Chloride: 84 mmol/L — ABNORMAL LOW (ref 98–111)
Creatinine, Ser: 0.63 mg/dL (ref 0.44–1.00)
GFR, Estimated: >60 mL/min (ref >60)
Glucose, Bld: 193 mg/dL — ABNORMAL HIGH (ref 70–99)
Potassium: 2.2 mmol/L — CL (ref 3.5–5.1)
Sodium: 128 mmol/L — ABNORMAL LOW (ref 135–145)
Total Bilirubin: 1.1 mg/dL (ref 0.3–1.2)
Total Protein: 6.5 g/dL (ref 6.5–8.1)

## 2021-09-01 LAB — LACTIC ACID, PLASMA
Lactic Acid, Venous: 1.3 mmol/L (ref 0.5–1.9)
Lactic Acid, Venous: 0.8 mmol/L (ref 0.5–1.9)

## 2021-09-01 LAB — GLUCOSE, CAPILLARY: Glucose-Capillary: 128 mg/dL — ABNORMAL HIGH (ref 70–99)

## 2021-09-01 LAB — D-DIMER, QUANTITATIVE: D-Dimer, Quant: >20 ug/mL-FEU — ABNORMAL HIGH (ref 0.00–0.50)

## 2021-09-01 LAB — MAGNESIUM: Magnesium: 1.6 mg/dL — ABNORMAL LOW (ref 1.7–2.4)

## 2021-09-01 MED ORDER — MAGNESIUM SULFATE 2 GM/50ML IV SOLN: 2.0000 g | Freq: Once | INTRAVENOUS | Status: DC

## 2021-09-01 MED ORDER — GABAPENTIN 300 MG PO CAPS
300.0000 mg | ORAL_CAPSULE | Freq: Three times a day (TID) | ORAL | Status: DC
  Administered 2021-09-01 – 2021-09-04 (×8): 300 mg via ORAL
  Filled 2021-09-01 (×8): qty 1

## 2021-09-01 MED ORDER — POTASSIUM CHLORIDE 10 MEQ/100ML IV SOLN
10.0000 meq | INTRAVENOUS | Status: AC
  Administered 2021-09-01 (×6): 10 meq via INTRAVENOUS
  Filled 2021-09-01 (×6): qty 100

## 2021-09-01 MED ORDER — ONDANSETRON HCL 4 MG PO TABS: 4.0000 mg | ORAL_TABLET | Freq: Four times a day (QID) | ORAL | Status: DC | PRN

## 2021-09-01 MED ORDER — HEPARIN SODIUM (PORCINE) 5000 UNIT/ML IJ SOLN
5000.0000 [IU] | Freq: Three times a day (TID) | INTRAMUSCULAR | Status: DC
  Administered 2021-09-01 – 2021-09-04 (×8): 5000 [IU] via SUBCUTANEOUS
  Filled 2021-09-01 (×8): qty 1

## 2021-09-01 MED ORDER — SODIUM CHLORIDE 0.9% FLUSH
3.0000 mL | Freq: Two times a day (BID) | INTRAVENOUS | Status: DC
  Administered 2021-09-02 – 2021-09-03 (×3): 3 mL via INTRAVENOUS

## 2021-09-01 MED ORDER — ALBUTEROL SULFATE (2.5 MG/3ML) 0.083% IN NEBU: 2.5000 mg | INHALATION_SOLUTION | Freq: Four times a day (QID) | RESPIRATORY_TRACT | Status: DC | PRN

## 2021-09-01 MED ORDER — SODIUM CHLORIDE 0.9% FLUSH: 3.0000 mL | INTRAVENOUS | Status: DC | PRN

## 2021-09-01 MED ORDER — POLYETHYLENE GLYCOL 3350 17 G PO PACK: 17.0000 g | PACK | Freq: Every day | ORAL | Status: DC | PRN

## 2021-09-01 MED ORDER — ASPIRIN 81 MG PO TBEC
81.0000 mg | DELAYED_RELEASE_TABLET | Freq: Every day | ORAL | Status: DC
  Administered 2021-09-02 – 2021-09-04 (×3): 81 mg via ORAL
  Filled 2021-09-01 (×4): qty 1

## 2021-09-01 MED ORDER — UMECLIDINIUM-VILANTEROL 62.5-25 MCG/ACT IN AEPB
1.0000 | INHALATION_SPRAY | Freq: Every day | RESPIRATORY_TRACT | Status: DC
  Administered 2021-09-02 – 2021-09-04 (×3): 1 via RESPIRATORY_TRACT
  Filled 2021-09-01: qty 14

## 2021-09-01 MED ORDER — ONDANSETRON HCL 4 MG/2ML IJ SOLN
4.0000 mg | Freq: Four times a day (QID) | INTRAMUSCULAR | Status: DC | PRN
  Administered 2021-09-02: 4 mg via INTRAVENOUS
  Filled 2021-09-01 (×3): qty 2

## 2021-09-01 MED ORDER — SODIUM CHLORIDE 0.9% FLUSH
3.0000 mL | Freq: Two times a day (BID) | INTRAVENOUS | Status: DC
  Administered 2021-09-02 – 2021-09-03 (×4): 3 mL via INTRAVENOUS

## 2021-09-01 MED ORDER — POTASSIUM CHLORIDE 10 MEQ/100ML IV SOLN: 10.0000 meq | INTRAVENOUS | Status: DC

## 2021-09-01 MED ORDER — LACTATED RINGERS IV BOLUS (SEPSIS)
500.0000 mL | Freq: Once | INTRAVENOUS | Status: AC
  Administered 2021-09-01: 500 mL via INTRAVENOUS

## 2021-09-01 MED ORDER — SODIUM CHLORIDE 0.9 % IV SOLN
2.0000 g | Freq: Once | INTRAVENOUS | Status: AC
  Administered 2021-09-01: 2 g via INTRAVENOUS
  Filled 2021-09-01: qty 20

## 2021-09-01 MED ORDER — TRAZODONE HCL 50 MG PO TABS
50.0000 mg | ORAL_TABLET | Freq: Every evening | ORAL | Status: DC | PRN
  Administered 2021-09-01 – 2021-09-02 (×2): 50 mg via ORAL
  Filled 2021-09-01 (×2): qty 1

## 2021-09-01 MED ORDER — FUROSEMIDE 20 MG PO TABS
20.0000 mg | ORAL_TABLET | Freq: Every day | ORAL | Status: DC
  Administered 2021-09-01 – 2021-09-04 (×4): 20 mg via ORAL
  Filled 2021-09-01 (×4): qty 1

## 2021-09-01 MED ORDER — SODIUM CHLORIDE 0.9 % IV SOLN: 250.0000 mL | INTRAVENOUS | Status: DC | PRN

## 2021-09-01 MED ORDER — POTASSIUM CHLORIDE IN NACL 20-0.9 MEQ/L-% IV SOLN: INTRAVENOUS | Status: DC

## 2021-09-01 MED ORDER — SODIUM CHLORIDE 0.9 % IV SOLN
1.0000 g | INTRAVENOUS | Status: DC
  Administered 2021-09-02 – 2021-09-03 (×2): 1 g via INTRAVENOUS
  Filled 2021-09-01 (×2): qty 10

## 2021-09-01 MED ORDER — OXYCODONE HCL 5 MG PO TABS
10.0000 mg | ORAL_TABLET | Freq: Four times a day (QID) | ORAL | Status: DC | PRN
  Administered 2021-09-01 – 2021-09-03 (×7): 10 mg via ORAL
  Filled 2021-09-01 (×7): qty 2

## 2021-09-01 MED ORDER — ACETAMINOPHEN 650 MG RE SUPP: 650.0000 mg | Freq: Four times a day (QID) | RECTAL | Status: DC | PRN

## 2021-09-01 MED ORDER — INSULIN ASPART 100 UNIT/ML IJ SOLN: 0.0000 [IU] | Freq: Every day | INTRAMUSCULAR | Status: DC

## 2021-09-01 MED ORDER — CLONAZEPAM 0.25 MG PO TBDP
0.2500 mg | ORAL_TABLET | Freq: Two times a day (BID) | ORAL | Status: DC | PRN
  Administered 2021-09-01 – 2021-09-02 (×3): 0.25 mg via ORAL
  Filled 2021-09-01 (×3): qty 1

## 2021-09-01 MED ORDER — PANTOPRAZOLE SODIUM 40 MG PO TBEC
40.0000 mg | DELAYED_RELEASE_TABLET | Freq: Every day | ORAL | Status: DC
  Administered 2021-09-01 – 2021-09-04 (×4): 40 mg via ORAL
  Filled 2021-09-01 (×4): qty 1

## 2021-09-01 MED ORDER — INSULIN ASPART 100 UNIT/ML IJ SOLN
0.0000 [IU] | Freq: Three times a day (TID) | INTRAMUSCULAR | Status: DC
  Administered 2021-09-02: 2 [IU] via SUBCUTANEOUS
  Administered 2021-09-02: 1 [IU] via SUBCUTANEOUS
  Administered 2021-09-02: 2 [IU] via SUBCUTANEOUS
  Administered 2021-09-03: 1 [IU] via SUBCUTANEOUS
  Administered 2021-09-04: 2 [IU] via SUBCUTANEOUS
  Administered 2021-09-04: 1 [IU] via SUBCUTANEOUS

## 2021-09-01 MED ORDER — CLONAZEPAM 0.25 MG PO TBDP: 0.2500 mg | ORAL_TABLET | Freq: Two times a day (BID) | ORAL | Status: DC

## 2021-09-01 MED ORDER — MAGNESIUM OXIDE -MG SUPPLEMENT 400 (240 MG) MG PO TABS
400.0000 mg | ORAL_TABLET | Freq: Every day | ORAL | Status: DC
  Administered 2021-09-01 – 2021-09-04 (×4): 400 mg via ORAL
  Filled 2021-09-01 (×4): qty 1

## 2021-09-01 MED ORDER — POTASSIUM CHLORIDE 20 MEQ PO PACK
40.0000 meq | PACK | Freq: Once | ORAL | Status: AC
  Administered 2021-09-01: 40 meq via ORAL
  Filled 2021-09-01: qty 2

## 2021-09-01 MED ORDER — ACETAMINOPHEN 325 MG PO TABS
650.0000 mg | ORAL_TABLET | Freq: Four times a day (QID) | ORAL | Status: DC | PRN
  Administered 2021-09-02 – 2021-09-03 (×2): 650 mg via ORAL
  Filled 2021-09-01 (×2): qty 2

## 2021-09-01 MED ORDER — CLONAZEPAM 0.25 MG PO TBDP: 0.2500 mg | ORAL_TABLET | Freq: Two times a day (BID) | ORAL | Status: DC | PRN

## 2021-09-01 MED ORDER — METOPROLOL TARTRATE 25 MG PO TABS
25.0000 mg | ORAL_TABLET | Freq: Two times a day (BID) | ORAL | Status: DC
  Administered 2021-09-02 – 2021-09-03 (×3): 25 mg via ORAL
  Filled 2021-09-01 (×3): qty 1

## 2021-09-01 MED ORDER — MAGNESIUM SULFATE 2 GM/50ML IV SOLN
2.0000 g | Freq: Once | INTRAVENOUS | Status: AC
  Administered 2021-09-01: 2 g via INTRAVENOUS
  Filled 2021-09-01: qty 50

## 2021-09-01 MED ORDER — IOHEXOL 350 MG/ML SOLN
80.0000 mL | Freq: Once | INTRAVENOUS | Status: AC | PRN
  Administered 2021-09-01: 80 mL via INTRAVENOUS

## 2021-09-01 MED ORDER — BISACODYL 10 MG RE SUPP: 10.0000 mg | Freq: Every day | RECTAL | Status: DC | PRN

## 2021-09-01 MED ORDER — IOHEXOL 300 MG/ML  SOLN
100.0000 mL | Freq: Once | INTRAMUSCULAR | Status: AC | PRN
  Administered 2021-09-01: 100 mL via INTRAVENOUS

## 2021-09-01 MED ORDER — DULOXETINE HCL 20 MG PO CPEP
20.0000 mg | ORAL_CAPSULE | Freq: Every day | ORAL | Status: DC
  Administered 2021-09-01: 20 mg via ORAL
  Filled 2021-09-01 (×2): qty 1

## 2021-09-01 MED ORDER — PRIMIDONE 50 MG PO TABS
50.0000 mg | ORAL_TABLET | Freq: Every day | ORAL | Status: DC
Start: 2021-09-01 — End: 2021-09-04
  Administered 2021-09-01 – 2021-09-03 (×3): 50 mg via ORAL
  Filled 2021-09-01 (×3): qty 1

## 2021-09-01 NOTE — ED Triage Notes (Signed)
Pt brought in by [facility] for weakness, pt found incontinent of stool and lying floor, pt was able to stand and pivot to stretcher, lives at home with husband, d/c from [place] 3 days ago.

## 2021-09-01 NOTE — ED Notes (Signed)
Date and time results received: 09/01/21 12:24 PM   Test: K Critical Value: 2.2  Name of Provider Notified: Dr. Durwin Nora  Orders Received? Or Actions Taken?: See orders

## 2021-09-01 NOTE — H&P (Addendum)
TRH H&P    Patient Demographics:    Gina Bradley, is a 61 y.o. female  MRN: [id number]  DOB - [birth year]  Admit Date - 09/01/2021  Referring MD/NP/PA: Godfrey Pick  Outpatient Primary MD for the patient is Redmond School, MD  Patient coming from: home  Chief complaint: Weakness   HPI:    Gina Bradley  is a 61 y.o. female w Hypertension, Dm2, HFpEF, steroid dependent Copd, opiate dependence L4 compression fracture/ pubic rami fracture 06/06/21, SVT 06/22/21, septic shock from pneumonia 06/22/21 s/p intubation discharged to SNF 08/04/21, who apparently went home from SNF 3 days ago and c/o weakness.  Pt denies fever, chills, cough, cp, palp, sob, diarrhea, dysuria, hematuria, flank pain, and notes increased weakness.  Unable to Pt takes lasix at home and is not on any potassium supplementation.     -Patient seen and evaluated on 09/01/21 by Dr Denton Brick, -This H&P was dictated here by Dr. Jani Gravel who also saw and evaluated patient on 09/01/2021 -Dr. Jani Gravel formulated plan of care for this patient  Roxan Hockey, MD   Review of systems:    In addition to the HPI above,  No Fever-chills, No Headache, No changes with Vision or hearing, No problems swallowing food or Liquids, No Chest pain, Cough or Shortness of Breath, No Abdominal pain, No Nausea or Vomiting, bowel movements are regular, No Blood in stool or Urine, No dysuria, No new skin rashes or bruises, No new joints pains-aches,  No new tingling, numbness in any extremity, No recent weight gain or loss, No polyuria, polydypsia or polyphagia, No significant Mental Stressors.  All other systems reviewed and are negative.    Past History of the following :    Past Medical History:  Diagnosis Date   Anemia    Bilateral pubic rami fractures ([place]) 06/06/2021   Cervical disc disease    COPD (chronic obstructive pulmonary disease) ([place])    on  home o2   Diabetes mellitus without complication (HCC)    Essential hypertension    GERD (gastroesophageal reflux disease)    Heart failure ([place]) 2018   History of cardiac catheterization    Normal coronaries March 2015   History of non-ST elevation myocardial infarction (NSTEMI)    Secondary to SVT   History of vertebral fracture 06/06/2021   L4   Lymphedema    Peripheral neuropathy    Rib fracture    09/01/2021   Sacral fracture (HCC)    Sleep apnea    Spinal stenosis    SVT (supraventricular tachycardia) ([place])       Past Surgical History:  Procedure Laterality Date   CESAREAN SECTION     ESOPHAGOGASTRODUODENOSCOPY N/A 02/10/2014   [id number] tiny antral erosion; otherwise normal EGD. No explanation for patient's symptoms. Gallbladder needs further evaluation.   LEFT HEART CATHETERIZATION WITH CORONARY ANGIOGRAM N/A 06/03/2013   Procedure: LEFT HEART CATHETERIZATION WITH CORONARY ANGIOGRAM;  Surgeon: Sinclair Grooms, MD;  Location: [facility] CATH LAB;  Service: Cardiovascular;  Laterality: N/A;      Social  History:      Social History   Tobacco Use   Smoking status: Former    Packs/day: 1.00    Years: 18.00    Pack years: 18.00    Types: Cigarettes    Start date: 09/17/2003    Quit date: 02/01/2019    Years since quitting: 2.5   Smokeless tobacco: Never  Substance Use Topics   Alcohol use: No    Alcohol/week: 0.0 standard drinks       Family History :     Family History  Problem Relation Age of Onset   Heart attack Mother    Diabetes Mother    Hypertension Mother    Angina Mother    Diabetes Father    Hypertension Father    Heart failure Brother    Suicidality Sister    Colon cancer Other        Possibly Dad    Stroke Maternal Grandmother    Heart failure Maternal Grandmother    Diabetes Maternal Grandfather    Hypertension Daughter    Hypertension Son       Home Medications:   Prior to Admission medications   Medication Sig Start Date End Date  Taking? Authorizing Provider  acetaminophen (TYLENOL) 325 MG tablet Take 2 tablets (650 mg total) by mouth every 4 (four) hours as needed for mild pain or fever (temp > 101.5). 08/04/21  Yes Elgergawy, Silver Huguenin, MD  albuterol (PROVENTIL HFA;VENTOLIN HFA) 108 (90 Base) MCG/ACT inhaler Inhale 1 puff into the lungs every 6 (six) hours as needed for wheezing or shortness of breath.   Yes [provider]  albuterol (PROVENTIL) (2.5 MG/3ML) 0.083% nebulizer solution Take 2.5 mg by nebulization daily as needed.   Yes [provider]  clonazePAM (KLONOPIN) 0.25 MG disintegrating tablet Take 1 tablet (0.25 mg total) by mouth 2 (two) times daily. 08/04/21  Yes Elgergawy, Silver Huguenin, MD  cyclobenzaprine (FLEXERIL) 5 MG tablet Take 1 tablet by mouth daily as needed for muscle pain. 05/11/20  Yes [provider]  DULoxetine (CYMBALTA) 20 MG capsule Take 1 capsule (20 mg total) by mouth daily. 08/05/21  Yes Elgergawy, Silver Huguenin, MD  fluticasone (FLONASE) 50 MCG/ACT nasal spray Place 2 sprays into both nostrils daily.   Yes [provider]  metoprolol tartrate (LOPRESSOR) 25 MG tablet Take 1 tablet (25 mg total) by mouth 2 (two) times daily. 08/04/21  Yes Elgergawy, Silver Huguenin, MD  nitroGLYCERIN (NITROSTAT) 0.4 MG SL tablet Place 1 tablet (0.4 mg total) under the tongue every 5 (five) minutes as needed for chest pain. 12/22/14  Yes Tanna Furry, MD  Oxycodone HCl 10 MG TABS Take 1 tablet (10 mg total) by mouth 4 (four) times daily as needed. 08/04/21  Yes Elgergawy, Silver Huguenin, MD  pantoprazole (PROTONIX) 40 MG tablet Take 40 mg by mouth daily. 05/25/13  Yes [provider]  predniSONE (DELTASONE) 5 MG tablet Take 1 tablet (5 mg total) by mouth daily with breakfast. 08/05/21  Yes Elgergawy, Silver Huguenin, MD  ANORO ELLIPTA 62.5-25 MCG/INH AEPB Inhale 1 puff into the lungs daily.  09/09/16   [provider]  aspirin EC 81 MG EC tablet Take 1 tablet (81 mg total) by mouth daily. Patient not  taking: Reported on 09/01/2021 06/03/13   Cherene Altes, MD  calcium carbonate (TUMS - DOSED IN MG ELEMENTAL CALCIUM) 500 MG chewable tablet Chew 1 tablet (200 mg of elemental calcium total) by mouth 3 (three) times daily as needed for  indigestion or heartburn. Patient not taking: Reported on 09/01/2021 08/04/21   Elgergawy, Silver Huguenin, MD  docusate sodium (COLACE) 100 MG capsule Take 2 capsules (200 mg total) by mouth 2 (two) times daily. Patient not taking: Reported on 09/01/2021 08/04/21   Elgergawy, Silver Huguenin, MD  EPINEPHrine (EPIPEN 2-PAK) 0.3 mg/0.3 mL IJ SOAJ injection Inject 0.3 mLs (0.3 mg total) into the muscle once as needed (for severe allergic reaction). CAll 911 immediately if you have to use this medicine 10/21/15   Noemi Chapel, MD  furosemide (LASIX) 20 MG tablet TAKE (1) TABLET BY MOUTH 2 TIMES DAILY AS NEEDED. Patient not taking: Reported on 09/01/2021 08/23/19   Evans Lance, MD  gabapentin (NEURONTIN) 300 MG capsule Take 1 capsule (300 mg total) by mouth every 8 (eight) hours. 08/04/21   Elgergawy, Silver Huguenin, MD  insulin aspart (NOVOLOG) 100 UNIT/ML injection Inject 0-9 Units into the skin 3 (three) times daily with meals. 08/04/21   Elgergawy, Silver Huguenin, MD  methocarbamol (ROBAXIN) 500 MG tablet Take 1 tablet (500 mg total) by mouth 2 (two) times daily. 06/06/21   Suzy Bouchard, PA-C  polyethylene glycol (MIRALAX / GLYCOLAX) 17 g packet Take 17 g by mouth 2 (two) times daily. Patient not taking: Reported on 09/01/2021 08/04/21   Elgergawy, Silver Huguenin, MD  primidone (MYSOLINE) 50 MG tablet Take 50 mg by mouth at bedtime.  Patient not taking: Reported on 09/01/2021 04/06/19   [provider]  QUEtiapine (SEROQUEL) 50 MG tablet Take 1 tablet (50 mg total) by mouth 2 (two) times daily. 08/04/21   Elgergawy, Silver Huguenin, MD     Allergies:     Allergies  Allergen Reactions   Bee Venom Other (See Comments)    Unknown   Tdap [Tetanus-Diphth-Acell Pertussis] Other (See Comments)    Unknown      Physical Exam:   Vitals  Blood pressure (!) 110/52, pulse 79, temperature 98.3 F (36.8 C), temperature source Oral, resp. rate 20, height 5\' 1"  (1.549 m), weight 95.3 kg, last menstrual period 01/05/2015, SpO2 99 %.  1.  General: AxOx3  2. Psychiatric: Euthymic  3. Neurologic: Cn2-12 intact, reflexes 2+ symmetric, diffuse with no clonus, motor 5/5 in all 4 ext  4. HEENMT:  Heent: anicteric, pupils 1.72mm symmetric, direct, consensual, near intact Neck: no jvd, no bruit  5. Respiratory : CTAB  6. Cardiovascular : Rrr s1, s2, no m/g/r  7. Gastrointestinal:  Morbidly obese, soft, nt, nd, +bs  8. Skin:  Venous stasis changes bilateral lower ext, slight bruise on left forearm  9.Musculoskeletal:  Good rom     Data Review:    CBC Recent Labs  Lab 09/01/21 1144  WBC 15.6*  HGB 11.3*  HCT 34.9*  PLT 303  MCV 85.3  MCH 27.6  MCHC 32.4  RDW 15.2  LYMPHSABS 0.9  MONOABS 0.8  EOSABS 0.0  BASOSABS 0.0   ------------------------------------------------------------------------------------------------------------------  Results for orders placed or performed during the hospital encounter of 09/01/21 (from the past 48 hour(s))  Urinalysis, Routine w reflex microscopic Urine, Clean Catch     Status: Abnormal   Collection Time: 09/01/21 11:08 AM  Result Value Ref Range   Color, Urine YELLOW YELLOW   APPearance CLEAR CLEAR   Specific Gravity, Urine 1.003 (L) 1.005 - 1.030   pH 7.0 5.0 - 8.0   Glucose, UA NEGATIVE NEGATIVE mg/dL   Hgb urine dipstick LARGE (A) NEGATIVE   Bilirubin Urine NEGATIVE NEGATIVE   Ketones, ur 5 (A)  NEGATIVE mg/dL   Protein, ur NEGATIVE NEGATIVE mg/dL   Nitrite NEGATIVE NEGATIVE   Leukocytes,Ua LARGE (A) NEGATIVE   RBC / HPF 0-5 0 - 5 RBC/hpf   WBC, UA 11-20 0 - 5 WBC/hpf   Bacteria, UA RARE (A) NONE SEEN   Squamous Epithelial / LPF 0-5 0 - 5    Comment: Performed at [facility], [address] [place], [place] [address]  Lactic  acid, plasma     Status: None   Collection Time: 09/01/21 11:44 AM  Result Value Ref Range   Lactic Acid, Venous 1.3 0.5 - 1.9 mmol/L    Comment: Performed at [facility], [address] [place], [place] [address]  Comprehensive metabolic panel     Status: Abnormal   Collection Time: 09/01/21 11:44 AM  Result Value Ref Range   Sodium 128 (L) 135 - 145 mmol/L   Potassium 2.2 (LL) 3.5 - 5.1 mmol/L    Comment: CRITICAL RESULT CALLED TO, READ BACK BY AND VERIFIED WITH: SHOOK,B AT 12:25PM ON 09/01/21 BY FESTERMAN,C    Chloride 84 (L) 98 - 111 mmol/L   CO2 32 22 - 32 mmol/L   Glucose, Bld 193 (H) 70 - 99 mg/dL    Comment: Glucose reference range applies only to samples taken after fasting for at least 8 hours.   BUN 10 6 - 20 mg/dL   Creatinine, Ser 0.63 0.44 - 1.00 mg/dL   Calcium 8.1 (L) 8.9 - 10.3 mg/dL   Total Protein 6.5 6.5 - 8.1 g/dL   Albumin 2.6 (L) 3.5 - 5.0 g/dL   AST 19 15 - 41 U/L   ALT 17 0 - 44 U/L   Alkaline Phosphatase 96 38 - 126 U/L   Total Bilirubin 1.1 0.3 - 1.2 mg/dL   GFR, Estimated >60 >60 mL/min    Comment: (NOTE) Calculated using the CKD-EPI Creatinine Equation (2021)    Anion gap 12 5 - 15    Comment: Performed at [facility], [address], [place], [place] [address]  CBC with Differential     Status: Abnormal   Collection Time: 09/01/21 11:44 AM  Result Value Ref Range   WBC 15.6 (H) 4.0 - 10.5 K/uL   RBC 4.09 3.87 - 5.11 MIL/uL   Hemoglobin 11.3 (L) 12.0 - 15.0 g/dL   HCT 34.9 (L) 36.0 - 46.0 %   MCV 85.3 80.0 - 100.0 fL   MCH 27.6 26.0 - 34.0 pg   MCHC 32.4 30.0 - 36.0 g/dL   RDW 15.2 11.5 - 15.5 %   Platelets 303 150 - 400 K/uL   nRBC 0.0 0.0 - 0.2 %   Neutrophils Relative % 88 %   Neutro Abs 13.6 (H) 1.7 - 7.7 K/uL   Lymphocytes Relative 6 %   Lymphs Abs 0.9 0.7 - 4.0 K/uL   Monocytes Relative 5 %   Monocytes Absolute 0.8 0.1 - 1.0 K/uL   Eosinophils Relative 0 %   Eosinophils Absolute 0.0 0.0 - 0.5 K/uL   Basophils Relative 0 %    Basophils Absolute 0.0 0.0 - 0.1 K/uL   Immature Granulocytes 1 %   Abs Immature Granulocytes 0.17 (H) 0.00 - 0.07 K/uL    Comment: Performed at [facility], [address] [place], [place] [address]  Protime-INR     Status: None   Collection Time: 09/01/21 11:44 AM  Result Value Ref Range   Prothrombin Time 13.4 11.4 - 15.2 seconds   INR 1.0 0.8 - 1.2  Comment: (NOTE) INR goal varies based on device and disease states. Performed at [facility], [address], [place], [place] [address]   D-dimer, quantitative     Status: Abnormal   Collection Time: 09/01/21 11:44 AM  Result Value Ref Range   D-Dimer, Quant >20.00 (H) 0.00 - 0.50 ug/mL-FEU    Comment: REPEATED TO VERIFY CRITICAL RESULT CALLED TO, READ BACK BY AND VERIFIED WITH: Cindie Laroche AT 12:55 Performed at [facility], [address], [place], [place] [address]   Troponin I (High Sensitivity)     Status: None   Collection Time: 09/01/21 11:44 AM  Result Value Ref Range   Troponin I (High Sensitivity) 9 <18 ng/L    Comment: (NOTE) Elevated high sensitivity troponin I (hsTnI) values and significant  changes across serial measurements may suggest ACS but many other  chronic and acute conditions are known to elevate hsTnI results.  Refer to the Links section for chest pain algorithms and additional  guidance. Performed at [facility], [address] [place], [place] [address]   Magnesium     Status: Abnormal   Collection Time: 09/01/21 11:44 AM  Result Value Ref Range   Magnesium 1.6 (L) 1.7 - 2.4 mg/dL    Comment: Performed at [facility], [address], [place], [place] [address]  Blood gas, venous ([facility], [facility], [facility])     Status: Abnormal   Collection Time: 09/01/21 11:47 AM  Result Value Ref Range   FIO2 28.00 %   pH, Ven 7.48 (H) 7.25 - 7.43   pCO2, Ven 48 44 - 60 mmHg   pO2, Ven 36 32 - 45 mmHg   Bicarbonate 35.7 (H) 20.0 - 28.0 mmol/L   Acid-Base Excess 10.7 (H) 0.0 - 2.0 mmol/L   O2 Saturation 63.2 %    Patient temperature 37.0    Collection site LEFT ANTECUBITAL    Drawn by [phone]     Comment: Performed at [facility], [address], [place], [place] [address]  Lactic acid, plasma     Status: None   Collection Time: 09/01/21  1:05 PM  Result Value Ref Range   Lactic Acid, Venous 0.8 0.5 - 1.9 mmol/L    Comment: Performed at [facility], [address] [place], [place] [address]  Troponin I (High Sensitivity)     Status: None   Collection Time: 09/01/21  1:05 PM  Result Value Ref Range   Troponin I (High Sensitivity) <2 <18 ng/L    Comment: (NOTE) Elevated high sensitivity troponin I (hsTnI) values and significant  changes across serial measurements may suggest ACS but many other  chronic and acute conditions are known to elevate hsTnI results.  Refer to the "Links" section for chest pain algorithms and additional  guidance. Performed at [facility], [address], [place], [place] [address]   Culture, blood (Routine X 2) w Reflex to ID Panel     Status: None (Preliminary result)   Collection Time: 09/01/21  2:46 PM   Specimen: BLOOD  Result Value Ref Range   Specimen Description BLOOD BLOOD RIGHT HAND    Special Requests      BOTTLES DRAWN AEROBIC ONLY Blood Culture results may not be optimal due to an inadequate volume of blood received in culture bottles Performed at [facility], [address], [place], [place] [address]    Culture PENDING    Report Status PENDING   Culture, blood (Routine X 2) w Reflex to ID Panel     Status:  None (Preliminary result)   Collection Time: 09/01/21  2:51 PM   Specimen: BLOOD  Result Value Ref Range   Specimen Description BLOOD BLOOD LEFT HAND    Special Requests      BOTTLES DRAWN AEROBIC ONLY Blood Culture adequate volume Performed at [facility], [address] [place], [place] [address]    Culture PENDING    Report Status PENDING     Chemistries  Recent Labs  Lab 09/01/21 1144  NA 128*  K 2.2*  CL 84*  CO2 32  GLUCOSE  193*  BUN 10  CREATININE 0.63  CALCIUM 8.1*  MG 1.6*  AST 19  ALT 17  ALKPHOS 96  BILITOT 1.1   ------------------------------------------------------------------------------------------------------------------  ------------------------------------------------------------------------------------------------------------------ GFR: Estimated Creatinine Clearance: 78.9 mL/min (by C-G formula based on SCr of 0.63 mg/dL). Liver Function Tests: Recent Labs  Lab 09/01/21 1144  AST 19  ALT 17  ALKPHOS 96  BILITOT 1.1  PROT 6.5  ALBUMIN 2.6*   No results for input(s): LIPASE, AMYLASE in the last 168 hours. No results for input(s): AMMONIA in the last 168 hours. Coagulation Profile: Recent Labs  Lab 09/01/21 1144  INR 1.0   Cardiac Enzymes: No results for input(s): CKTOTAL, CKMB, CKMBINDEX, TROPONINI in the last 168 hours. BNP (last 3 results) No results for input(s): PROBNP in the last 8760 hours. HbA1C: No results for input(s): HGBA1C in the last 72 hours. CBG: No results for input(s): GLUCAP in the last 168 hours. Lipid Profile: No results for input(s): CHOL, HDL, LDLCALC, TRIG, CHOLHDL, LDLDIRECT in the last 72 hours. Thyroid Function Tests: No results for input(s): TSH, T4TOTAL, FREET4, T3FREE, THYROIDAB in the last 72 hours. Anemia Panel: No results for input(s): VITAMINB12, FOLATE, FERRITIN, TIBC, IRON, RETICCTPCT in the last 72 hours.  --------------------------------------------------------------------------------------------------------------- Urine analysis:    Component Value Date/Time   COLORURINE YELLOW 09/01/2021 1108   APPEARANCEUR CLEAR 09/01/2021 1108   LABSPEC 1.003 (L) 09/01/2021 1108   PHURINE 7.0 09/01/2021 1108   GLUCOSEU NEGATIVE 09/01/2021 1108   HGBUR LARGE (A) 09/01/2021 1108   BILIRUBINUR NEGATIVE 09/01/2021 1108   KETONESUR 5 (A) 09/01/2021 1108   PROTEINUR NEGATIVE 09/01/2021 1108   UROBILINOGEN 1.0 07/29/2009 1213   NITRITE NEGATIVE  09/01/2021 1108   LEUKOCYTESUR LARGE (A) 09/01/2021 1108      Imaging Results:    CT Angio Chest PE W and/or Wo Contrast  Result Date: 09/01/2021 CLINICAL DATA:  Elevated D-dimer level. Patient found on floor, stool incontinence EXAM: CT ANGIOGRAPHY CHEST WITH CONTRAST TECHNIQUE: Multidetector CT imaging of the chest was performed using the standard protocol during bolus administration of intravenous contrast. Multiplanar CT image reconstructions and MIPs were obtained to evaluate the vascular anatomy. RADIATION DOSE REDUCTION: This exam was performed according to the departmental dose-optimization program which includes automated exposure control, adjustment of the mA and/or kV according to patient size and/or use of iterative reconstruction technique. CONTRAST:  11mL OMNIPAQUE IOHEXOL 350 MG/ML SOLN COMPARISON:  09/01/2021 and 07/03/2021 FINDINGS: Cardiovascular: No filling defect is identified in the pulmonary arterial tree to suggest pulmonary embolus. Mild aortic atherosclerotic vascular calcification. Mild cardiomegaly. Mediastinum/Nodes: Unremarkable Lungs/Pleura: Ground-glass and linear opacities in the perihilar regions with the airspace opacity substantially improved compared to 07/03/2021. Much of the nodular component of airspace opacity shown on prior exam has resolved. No pleural effusion. Upper Abdomen: Nonspecific questionable 1.3 cm ill-defined hypodensity centrally in the lateral segment left hepatic lobe on image 89 series 4. The upper pole the left kidney is substantially  more hypodense than the upper pole the right kidney. I am uncertain whether this is related to the very early phase of contrast or if there is an inflammatory process such as pyelonephritis, correlate with urine analysis. Musculoskeletal: Numerous bilateral rib fractures in various states of healing. There are some new fractures visible compared to 07/03/2021 including the right anterior fifth and sixth ribs as well as  the left anterior fourth rib. Review of the MIP images confirms the above findings. IMPRESSION: 1. No filling defect is identified in the pulmonary arterial tree to suggest pulmonary embolus. 2. Substantial improvement in bilateral airspace opacities compared to 07/03/2021. 3. Numerous healing bilateral rib fractures. Compared to 07/03/2021 there are some new healing rib fractures including the right anterior fifth and sixth ribs as well as the left anterior fourth rib. This is in addition to multiple other healing fractures. 4. Asymmetric hypodensity in the parenchyma of the left kidney upper pole compared to the right. This could be due to the very early phase of contrast causing some asymmetry, but pyelonephritis could cause a similar appearance. Correlate with urine analysis. 5. Nonspecific 1.3 cm ill-defined hypodensity centrally in the lateral segment left hepatic lobe. This could be further assessed with dedicated hepatic imaging if clinically warranted. 6. Mild cardiomegaly.  Aortic Atherosclerosis (ICD10-I70.0). Electronically Signed   By: Van Clines M.D.   On: 09/01/2021 14:32   DG Chest Port 1 View  Result Date: 09/01/2021 CLINICAL DATA:  Question sepsis EXAM: PORTABLE CHEST 1 VIEW COMPARISON:  07/27/2021 FINDINGS: Previously seen tracheostomy tube is no longer present. Heart size is upper limits of normal. Chronic aortic atherosclerosis. Minimal residual atelectasis at the lung bases, improved. No worsening or new finding. No effusion. IMPRESSION: Improved chest radiography.  Minimal residual basilar atelectasis. Electronically Signed   By: Nelson Chimes M.D.   On: 09/01/2021 12:37   DG Hip Unilat W or Wo Pelvis 2-3 Views Left  Result Date: 09/01/2021 CLINICAL DATA:  Question sepsis.  Pain. EXAM: DG HIP (WITH OR WITHOUT PELVIS) 2-3V LEFT COMPARISON:  CT abdomen 06/22/2021 FINDINGS: Healing bilateral rami fractures. Healing right sacral fractures. No hip fracture. Other bones of the pelvis  appear normal. IMPRESSION: No acute finding. Healing fractures of the right sacrum and bilateral pubic rami. Electronically Signed   By: Nelson Chimes M.D.   On: 09/01/2021 12:40     Assessment & Plan:    Principal Problem:   Hypokalemia Active Problems:   Hyponatremia   Hypomagnesemia   Normocytic anemia   Sepsis secondary to UTI (HCC)   Rib fractures   Hypokalemia Replete with 56meq Kcl iv x6 Check cmp in am  Hypomagnesemia Start magnesium oxide 400mg  po qday Check magnesium in am  UTI, possible early sepsis (hypotension dbp 47, wbc 15.6, rr 21) Awaiting urine and blood cultures Pt received Rocephin 2gm iv x 1 in ED Start Rocephin 1gm iv qday  Hyponatremia Pt will received ns iv with KCL Check cmp in am Consider further w/up if not improving  Anemia improving Check cbc in am  Rib Fractures, pubic rami fractures, L4 compression fracture Cont Gabapentin 300mg  po q8h Cont oxycodone 10mg  po qid prn  Will need outpatient bone density test and antiresorptive agent  Copd /OSA on home o2 Cont prednisone 5mg  po qday Cont Anoro Albuterol neb q6h prn   HFpEF/CAD Cont Lasix 20mg  po qday Cont Aspirin Cont metoprolol 25mg  po bid  DM2 Cont ISS  RLS Cont Primidone  Anxiety/ Depression/ Insomnia Cont Duloxetine 20mg   po qday Cont Clonazepam 0.25mg  po bid  Cont Trazodone 50mg  po qhs prn   [employer]   -Patient seen and evaluated on 09/01/21 by Dr Denton Brick, -This H&P was dictated here by Dr. Jani Gravel who also saw and evaluated patient on 09/01/2021 -Dr. Jani Gravel formulated plan of care for this patient  Roxan Hockey, MD  DVT Prophylaxis-  Heparin [place]  Attempted to call family x2 no response at [phone]   AM Labs Ordered, also please review Full Orders  Family Communication: Admission, patients condition and plan of care including tests being ordered have been discussed with the patient and who indicate understanding and agree with the plan and  Code Status.  Code Status:  FULL CODE  Admission status: Observation: Based on patients clinical presentation and evaluation of above clinical data, I have made determination that patient meets observation criteria at this time  Pt seen with Dr. Denton Brick  Time spent in minutes :  50 minutes   Jani Gravel M.D on 09/01/2021 at 4:35 PM [phone]  Please call [facility] night hospitalist from [place] [phone]

## 2021-09-01 NOTE — Progress Notes (Addendum)
Pt has deep lacerations to 2nd & 3rd digit on left foot. Pt stated she fell at home which caused the lacerations. MD made aware. New orders placed.

## 2021-09-01 NOTE — ED Provider Notes (Signed)
[facility] EMERGENCY DEPARTMENT Provider Note   CSN: [id number] Arrival date & time: 09/01/21  1044     History  Chief Complaint  Patient presents with   Weakness    Gina Bradley is a 61 y.o. female.   Weakness Patient presents for weakness and fall.  Her medical history includes HLD, PSVT, HTN, constipation, GERD, T2DM, CHF, obesity, and COPD.  She is on oxygen at baseline.  Patient reports that this morning, she felt weak and this caused her to fall.  When she fell, she landed on her left side.  She was unable to get up off of the floor.  This prompted a call to EMS.  EMS helped her off of the floor.  Patient was not complaining of any discomfort.  Vital signs were normal during transit.  Per chart review, she was admitted to the hospital on 3/21.  She required intubation and ICU care.  She underwent tracheostomy on 3/28.  She was transferred to the floor on 4/11.  She was discharged to SNF on 5/3.  She returned home 3 days ago.    Home Medications Prior to Admission medications   Medication Sig Start Date End Date Taking? Authorizing Provider  acetaminophen (TYLENOL) 325 MG tablet Take 2 tablets (650 mg total) by mouth every 4 (four) hours as needed for mild pain or fever (temp > 101.5). 08/04/21  Yes Elgergawy, Silver Huguenin, MD  albuterol (PROVENTIL HFA;VENTOLIN HFA) 108 (90 Base) MCG/ACT inhaler Inhale 1 puff into the lungs every 6 (six) hours as needed for wheezing or shortness of breath.   Yes [provider]  albuterol (PROVENTIL) (2.5 MG/3ML) 0.083% nebulizer solution Take 2.5 mg by nebulization daily as needed.   Yes [provider]  ANORO ELLIPTA 62.5-25 MCG/INH AEPB Inhale 1 puff into the lungs daily.  09/09/16  Yes [provider]  clonazePAM (KLONOPIN) 0.25 MG disintegrating tablet Take 1 tablet (0.25 mg total) by mouth 2 (two) times daily. 08/04/21  Yes Elgergawy, Silver Huguenin, MD  cyclobenzaprine (FLEXERIL) 5 MG tablet Take 1 tablet by mouth daily as  needed for muscle pain. 05/11/20  Yes [provider]  DULoxetine (CYMBALTA) 20 MG capsule Take 1 capsule (20 mg total) by mouth daily. 08/05/21  Yes Elgergawy, Silver Huguenin, MD  fluticasone (FLONASE) 50 MCG/ACT nasal spray Place 2 sprays into both nostrils daily.   Yes [provider]  metoprolol tartrate (LOPRESSOR) 25 MG tablet Take 1 tablet (25 mg total) by mouth 2 (two) times daily. 08/04/21  Yes Elgergawy, Silver Huguenin, MD  nitroGLYCERIN (NITROSTAT) 0.4 MG SL tablet Place 1 tablet (0.4 mg total) under the tongue every 5 (five) minutes as needed for chest pain. 12/22/14  Yes Tanna Furry, MD  Oxycodone HCl 10 MG TABS Take 1 tablet (10 mg total) by mouth 4 (four) times daily as needed. 08/04/21  Yes Elgergawy, Silver Huguenin, MD  pantoprazole (PROTONIX) 40 MG tablet Take 40 mg by mouth daily. 05/25/13  Yes [provider]  predniSONE (DELTASONE) 5 MG tablet Take 1 tablet (5 mg total) by mouth daily with breakfast. 08/05/21  Yes Elgergawy, Silver Huguenin, MD  aspirin EC 81 MG EC tablet Take 1 tablet (81 mg total) by mouth daily. Patient not taking: Reported on 09/01/2021 06/03/13   Cherene Altes, MD  calcium carbonate (TUMS - DOSED IN MG ELEMENTAL CALCIUM) 500 MG chewable tablet Chew 1 tablet (200 mg of elemental calcium total) by mouth 3 (three) times daily as needed for indigestion  or heartburn. Patient not taking: Reported on 09/01/2021 08/04/21   Elgergawy, Silver Huguenin, MD  docusate sodium (COLACE) 100 MG capsule Take 2 capsules (200 mg total) by mouth 2 (two) times daily. Patient not taking: Reported on 09/01/2021 08/04/21   Elgergawy, Silver Huguenin, MD  EPINEPHrine (EPIPEN 2-PAK) 0.3 mg/0.3 mL IJ SOAJ injection Inject 0.3 mLs (0.3 mg total) into the muscle once as needed (for severe allergic reaction). CAll 911 immediately if you have to use this medicine 10/21/15   Noemi Chapel, MD  furosemide (LASIX) 20 MG tablet TAKE (1) TABLET BY MOUTH 2 TIMES DAILY AS NEEDED. Patient not taking: Reported on 09/01/2021  08/23/19   Evans Lance, MD  gabapentin (NEURONTIN) 300 MG capsule Take 1 capsule (300 mg total) by mouth every 8 (eight) hours. 08/04/21   Elgergawy, Silver Huguenin, MD  insulin aspart (NOVOLOG) 100 UNIT/ML injection Inject 0-9 Units into the skin 3 (three) times daily with meals. 08/04/21   Elgergawy, Silver Huguenin, MD  methocarbamol (ROBAXIN) 500 MG tablet Take 1 tablet (500 mg total) by mouth 2 (two) times daily. 06/06/21   Suzy Bouchard, PA-C  polyethylene glycol (MIRALAX / GLYCOLAX) 17 g packet Take 17 g by mouth 2 (two) times daily. Patient not taking: Reported on 09/01/2021 08/04/21   Elgergawy, Silver Huguenin, MD  primidone (MYSOLINE) 50 MG tablet Take 50 mg by mouth at bedtime.  Patient not taking: Reported on 09/01/2021 04/06/19   [provider]  QUEtiapine (SEROQUEL) 50 MG tablet Take 1 tablet (50 mg total) by mouth 2 (two) times daily. Patient not taking: Reported on 09/01/2021 08/04/21   Elgergawy, Silver Huguenin, MD      Allergies    Bee venom and Tdap [tetanus-diphth-acell pertussis]    Review of Systems   Review of Systems  Constitutional:  Positive for fatigue.  Neurological:  Positive for weakness.  All other systems reviewed and are negative.  Physical Exam Updated Vital Signs BP (!) 129/58 (BP Location: Right Arm)   Pulse 87   Temp 97.9 F (36.6 C) (Oral)   Resp 20   Ht 5\' 1"  (1.549 m)   Wt 91.6 kg   LMP 01/05/2015   SpO2 97%   BMI 38.16 kg/m  Physical Exam Vitals and nursing note reviewed.  Constitutional:      General: She is not in acute distress.    Appearance: She is well-developed. She is ill-appearing. She is not toxic-appearing or diaphoretic.  HENT:     Head: Normocephalic and atraumatic.     Right Ear: External ear normal.     Left Ear: External ear normal.     Nose: Nose normal.     Mouth/Throat:     Mouth: Mucous membranes are moist.     Pharynx: Oropharynx is clear.  Eyes:     Extraocular Movements: Extraocular movements intact.     Conjunctiva/sclera:  Conjunctivae normal.  Cardiovascular:     Rate and Rhythm: Normal rate and regular rhythm.     Heart sounds: No murmur heard. Pulmonary:     Effort: Pulmonary effort is normal. No respiratory distress.     Breath sounds: Normal breath sounds. No wheezing or rales.  Abdominal:     General: There is no distension.     Palpations: Abdomen is soft.  Musculoskeletal:        General: No swelling, tenderness or deformity.     Cervical back: Normal range of motion and neck supple.  Skin:    General: Skin  is warm and dry.     Capillary Refill: Capillary refill takes less than 2 seconds.     Coloration: Skin is not jaundiced or pale.  Neurological:     General: No focal deficit present.     Mental Status: She is alert and oriented to person, place, and time.     Cranial Nerves: No cranial nerve deficit.     Sensory: No sensory deficit.     Motor: No weakness.     Coordination: Coordination normal.  Psychiatric:        Mood and Affect: Affect is flat.        Speech: Speech is delayed.        Behavior: Behavior is slowed.    ED Results / Procedures / Treatments   Labs (all labs ordered are listed, but only abnormal results are displayed) Labs Reviewed  COMPREHENSIVE METABOLIC PANEL - Abnormal; Notable for the following components:      Result Value   Sodium 128 (*)    Potassium 2.2 (*)    Chloride 84 (*)    Glucose, Bld 193 (*)    Calcium 8.1 (*)    Albumin 2.6 (*)    All other components within normal limits  CBC WITH DIFFERENTIAL/PLATELET - Abnormal; Notable for the following components:   WBC 15.6 (*)    Hemoglobin 11.3 (*)    HCT 34.9 (*)    Neutro Abs 13.6 (*)    Abs Immature Granulocytes 0.17 (*)    All other components within normal limits  URINALYSIS, ROUTINE W REFLEX MICROSCOPIC - Abnormal; Notable for the following components:   Specific Gravity, Urine 1.003 (*)    Hgb urine dipstick LARGE (*)    Ketones, ur 5 (*)    Leukocytes,Ua LARGE (*)    Bacteria, UA RARE  (*)    All other components within normal limits  BLOOD GAS, VENOUS - Abnormal; Notable for the following components:   pH, Ven 7.48 (*)    Bicarbonate 35.7 (*)    Acid-Base Excess 10.7 (*)    All other components within normal limits  D-DIMER, QUANTITATIVE - Abnormal; Notable for the following components:   D-Dimer, Quant >20.00 (*)    All other components within normal limits  MAGNESIUM - Abnormal; Notable for the following components:   Magnesium 1.6 (*)    All other components within normal limits  CULTURE, BLOOD (ROUTINE X 2)  CULTURE, BLOOD (ROUTINE X 2)  URINE CULTURE  LACTIC ACID, PLASMA  LACTIC ACID, PLASMA  PROTIME-INR  CBC  COMPREHENSIVE METABOLIC PANEL  MAGNESIUM  HIV ANTIBODY (ROUTINE TESTING W REFLEX)  TROPONIN I (HIGH SENSITIVITY)  TROPONIN I (HIGH SENSITIVITY)    EKG None  Radiology CT Angio Chest PE W and/or Wo Contrast  Result Date: 09/01/2021 CLINICAL DATA:  Elevated D-dimer level. Patient found on floor, stool incontinence EXAM: CT ANGIOGRAPHY CHEST WITH CONTRAST TECHNIQUE: Multidetector CT imaging of the chest was performed using the standard protocol during bolus administration of intravenous contrast. Multiplanar CT image reconstructions and MIPs were obtained to evaluate the vascular anatomy. RADIATION DOSE REDUCTION: This exam was performed according to the departmental dose-optimization program which includes automated exposure control, adjustment of the mA and/or kV according to patient size and/or use of iterative reconstruction technique. CONTRAST:  56mL OMNIPAQUE IOHEXOL 350 MG/ML SOLN COMPARISON:  09/01/2021 and 07/03/2021 FINDINGS: Cardiovascular: No filling defect is identified in the pulmonary arterial tree to suggest pulmonary embolus. Mild aortic atherosclerotic vascular calcification. Mild cardiomegaly. Mediastinum/Nodes: Unremarkable  Lungs/Pleura: Ground-glass and linear opacities in the perihilar regions with the airspace opacity substantially  improved compared to 07/03/2021. Much of the nodular component of airspace opacity shown on prior exam has resolved. No pleural effusion. Upper Abdomen: Nonspecific questionable 1.3 cm ill-defined hypodensity centrally in the lateral segment left hepatic lobe on image 89 series 4. The upper pole the left kidney is substantially more hypodense than the upper pole the right kidney. I am uncertain whether this is related to the very early phase of contrast or if there is an inflammatory process such as pyelonephritis, correlate with urine analysis. Musculoskeletal: Numerous bilateral rib fractures in various states of healing. There are some new fractures visible compared to 07/03/2021 including the right anterior fifth and sixth ribs as well as the left anterior fourth rib. Review of the MIP images confirms the above findings. IMPRESSION: 1. No filling defect is identified in the pulmonary arterial tree to suggest pulmonary embolus. 2. Substantial improvement in bilateral airspace opacities compared to 07/03/2021. 3. Numerous healing bilateral rib fractures. Compared to 07/03/2021 there are some new healing rib fractures including the right anterior fifth and sixth ribs as well as the left anterior fourth rib. This is in addition to multiple other healing fractures. 4. Asymmetric hypodensity in the parenchyma of the left kidney upper pole compared to the right. This could be due to the very early phase of contrast causing some asymmetry, but pyelonephritis could cause a similar appearance. Correlate with urine analysis. 5. Nonspecific 1.3 cm ill-defined hypodensity centrally in the lateral segment left hepatic lobe. This could be further assessed with dedicated hepatic imaging if clinically warranted. 6. Mild cardiomegaly.  Aortic Atherosclerosis (ICD10-I70.0). Electronically Signed   By: Van Clines M.D.   On: 09/01/2021 14:32   DG Chest Port 1 View  Result Date: 09/01/2021 CLINICAL DATA:  Question sepsis  EXAM: PORTABLE CHEST 1 VIEW COMPARISON:  07/27/2021 FINDINGS: Previously seen tracheostomy tube is no longer present. Heart size is upper limits of normal. Chronic aortic atherosclerosis. Minimal residual atelectasis at the lung bases, improved. No worsening or new finding. No effusion. IMPRESSION: Improved chest radiography.  Minimal residual basilar atelectasis. Electronically Signed   By: Nelson Chimes M.D.   On: 09/01/2021 12:37   DG Hip Unilat W or Wo Pelvis 2-3 Views Left  Result Date: 09/01/2021 CLINICAL DATA:  Question sepsis.  Pain. EXAM: DG HIP (WITH OR WITHOUT PELVIS) 2-3V LEFT COMPARISON:  CT abdomen 06/22/2021 FINDINGS: Healing bilateral rami fractures. Healing right sacral fractures. No hip fracture. Other bones of the pelvis appear normal. IMPRESSION: No acute finding. Healing fractures of the right sacrum and bilateral pubic rami. Electronically Signed   By: Nelson Chimes M.D.   On: 09/01/2021 12:40    Procedures Procedures    Medications Ordered in ED Medications  potassium chloride 10 mEq in 100 mL IVPB (10 mEq Intravenous New Bag/Given 09/01/21 1705)  0.9 % NaCl with KCl 20 mEq/ L  infusion ( Intravenous New Bag/Given 09/01/21 1702)  cefTRIAXone (ROCEPHIN) 1 g in sodium chloride 0.9 % 100 mL IVPB (has no administration in time range)  magnesium oxide (MAG-OX) tablet 400 mg (has no administration in time range)  oxyCODONE (Oxy IR/ROXICODONE) immediate release tablet 10 mg (has no administration in time range)  aspirin EC tablet 81 mg (has no administration in time range)  metoprolol tartrate (LOPRESSOR) tablet 25 mg (has no administration in time range)  DULoxetine (CYMBALTA) DR capsule 20 mg (has no administration in time range)  pantoprazole (  PROTONIX) EC tablet 40 mg (has no administration in time range)  clonazePAM (KLONOPIN) disintegrating tablet 0.25 mg (has no administration in time range)  gabapentin (NEURONTIN) capsule 300 mg (has no administration in time range)   primidone (MYSOLINE) tablet 50 mg (has no administration in time range)  insulin aspart (novoLOG) injection 0-9 Units (has no administration in time range)  insulin aspart (novoLOG) injection 0-5 Units (has no administration in time range)  sodium chloride flush (NS) 0.9 % injection 3 mL (has no administration in time range)  sodium chloride flush (NS) 0.9 % injection 3 mL (has no administration in time range)  sodium chloride flush (NS) 0.9 % injection 3 mL (has no administration in time range)  0.9 %  sodium chloride infusion (has no administration in time range)  acetaminophen (TYLENOL) tablet 650 mg (has no administration in time range)    Or  acetaminophen (TYLENOL) suppository 650 mg (has no administration in time range)  traZODone (DESYREL) tablet 50 mg (has no administration in time range)  polyethylene glycol (MIRALAX / GLYCOLAX) packet 17 g (has no administration in time range)  bisacodyl (DULCOLAX) suppository 10 mg (has no administration in time range)  ondansetron (ZOFRAN) tablet 4 mg (has no administration in time range)    Or  ondansetron (ZOFRAN) injection 4 mg (has no administration in time range)  heparin injection 5,000 Units (has no administration in time range)  lactated ringers bolus 500 mL (0 mLs Intravenous Stopped 09/01/21 1320)  potassium chloride (KLOR-CON) packet 40 mEq (40 mEq Oral Given 09/01/21 1319)  magnesium sulfate IVPB 2 g 50 mL (0 g Intravenous Stopped 09/01/21 1426)  iohexol (OMNIPAQUE) 350 MG/ML injection 80 mL (80 mLs Intravenous Contrast Given 09/01/21 1414)  cefTRIAXone (ROCEPHIN) 2 g in sodium chloride 0.9 % 100 mL IVPB (2 g Intravenous New Bag/Given 09/01/21 1456)    ED Course/ Medical Decision Making/ A&P                           Medical Decision Making Amount and/or Complexity of Data Reviewed Labs: ordered. Radiology: ordered. ECG/medicine tests: ordered.  Risk Prescription drug management. Decision regarding hospitalization.   This  patient presents to the ED for concern of generalized weakness, this involves an extensive number of treatment options, and is a complaint that carries with it a high risk of complications and morbidity.  The differential diagnosis includes infection, polypharmacy, dehydration, deconditioning, metabolic abnormalities, anemia, ACS   Co morbidities that complicate the patient evaluation  HLD, PSVT, HTN, constipation, GERD, T2DM, CHF, obesity, and COPD   Additional history obtained:  Additional history obtained from EMS External records from outside source obtained and reviewed including EMR   Lab Tests:  I Ordered, and personally interpreted labs.  The pertinent results include: Severe hypokalemia, hypomagnesemia, hyponatremia, leukocytosis, anemia improved from baseline, normal lactate, normal troponin, markedly elevated D-dimer, evidence of UTI on urinalysis   Imaging Studies ordered:  I ordered imaging studies including chest x-ray, left hip x-ray, CTA chest I independently visualized and interpreted imaging which showed no acute findings on x-ray images; on CTA, patient has multiple old rib fractures, in addition to asymmetric hypodensity in parenchyma of left upper kidney pole and an ill-defined hypodensity in central lateral segment of left hepatic lobe.  These findings are nonspecific. I agree with the radiologist interpretation   Cardiac Monitoring: / EKG:  The patient was maintained on a cardiac monitor.  I personally viewed and interpreted the  cardiac monitored which showed an underlying rhythm of: Sinus rhythm   Problem List / ED Course / Critical interventions / Medication management  Patient is a 61 year old female who presents for generalized weakness.  Due to her generalized weakness, she did have a fall at home.  She was unable to get up off of the floor and EMS was called.  EMS helped her off of the floor.  They report that she was laying in some feces.  Patient had  normal vital signs during transit to the hospital.  On arrival, she denies any areas of discomfort.  She is very slow to respond to questioning but is alert and oriented.  There is no obvious external evidence of traumatic injuries from her fall.  She did report that she landed on her left side and left leg appeared slightly shortened compared to the right.  This prompted a hip x-ray which was negative for acute injuries.  Patient's lab work was notable for severe hypokalemia.  Oral and IV replacement was ordered.  Magnesium sulfate was given for hypomagnesemia.  Bolus of IV fluids was ordered.  Additional lab findings include leukocytosis and a severely elevated D-dimer.  This prompted a CTA of chest which was negative for PE.  Patient's urinalysis showed evidence of acute infection.  Ceftriaxone was ordered for treatment of UTI, which may have been contributing to her weakness.  Patient did have improvement in the ED and did seem more alert with quicker verbal responses than she did on arrival.  Due to her severe hypokalemia, patient was admitted to hospitalist for further management. I ordered medication including IV fluids for hydration; potassium chloride for hypokalemia, magnesium sulfate for hypomagnesemia, ceftriaxone for UTI Reevaluation of the patient after these medicines showed that the patient improved I have reviewed the patients home medicines and have made adjustments as needed   Social Determinants of Health:  Recent complicated hospitalization followed by skilled nursing facility placement.  Returned home 3 days ago.  CRITICAL CARE Performed by: Godfrey Pick   Total critical care time: 35 minutes  Critical care time was exclusive of separately billable procedures and treating other patients.  Critical care was necessary to treat or prevent imminent or life-threatening deterioration.  Critical care was time spent personally by me on the following activities: development of  treatment plan with patient and/or surrogate as well as nursing, discussions with consultants, evaluation of patient's response to treatment, examination of patient, obtaining history from patient or surrogate, ordering and performing treatments and interventions, ordering and review of laboratory studies, ordering and review of radiographic studies, pulse oximetry and re-evaluation of patient's condition.         Final Clinical Impression(s) / ED Diagnoses Final diagnoses:  Generalized weakness  Hypokalemia  Hypomagnesemia  Acute cystitis without hematuria    Rx / DC Orders ED Discharge Orders     None         Godfrey Pick, MD 09/01/21 [phone]

## 2021-09-02 DIAGNOSIS — I5032 Chronic diastolic (congestive) heart failure: Secondary | ICD-10-CM | POA: Diagnosis present

## 2021-09-02 DIAGNOSIS — J449 Chronic obstructive pulmonary disease, unspecified: Secondary | ICD-10-CM | POA: Diagnosis present

## 2021-09-02 DIAGNOSIS — S2243XD Multiple fractures of ribs, bilateral, subsequent encounter for fracture with routine healing: Secondary | ICD-10-CM | POA: Diagnosis not present

## 2021-09-02 DIAGNOSIS — E43 Unspecified severe protein-calorie malnutrition: Secondary | ICD-10-CM | POA: Diagnosis present

## 2021-09-02 DIAGNOSIS — E860 Dehydration: Secondary | ICD-10-CM | POA: Diagnosis present

## 2021-09-02 DIAGNOSIS — D649 Anemia, unspecified: Secondary | ICD-10-CM | POA: Diagnosis present

## 2021-09-02 DIAGNOSIS — A4151 Sepsis due to Escherichia coli [E. coli]: Secondary | ICD-10-CM | POA: Diagnosis present

## 2021-09-02 DIAGNOSIS — F1120 Opioid dependence, uncomplicated: Secondary | ICD-10-CM | POA: Diagnosis present

## 2021-09-02 DIAGNOSIS — S32501D Unspecified fracture of right pubis, subsequent encounter for fracture with routine healing: Secondary | ICD-10-CM | POA: Diagnosis not present

## 2021-09-02 DIAGNOSIS — E871 Hypo-osmolality and hyponatremia: Secondary | ICD-10-CM | POA: Diagnosis present

## 2021-09-02 DIAGNOSIS — X58XXXD Exposure to other specified factors, subsequent encounter: Secondary | ICD-10-CM | POA: Diagnosis present

## 2021-09-02 DIAGNOSIS — E876 Hypokalemia: Secondary | ICD-10-CM | POA: Diagnosis present

## 2021-09-02 DIAGNOSIS — G2581 Restless legs syndrome: Secondary | ICD-10-CM | POA: Diagnosis present

## 2021-09-02 DIAGNOSIS — I252 Old myocardial infarction: Secondary | ICD-10-CM | POA: Diagnosis not present

## 2021-09-02 DIAGNOSIS — F32A Depression, unspecified: Secondary | ICD-10-CM | POA: Diagnosis present

## 2021-09-02 DIAGNOSIS — E119 Type 2 diabetes mellitus without complications: Secondary | ICD-10-CM | POA: Diagnosis present

## 2021-09-02 DIAGNOSIS — I2510 Atherosclerotic heart disease of native coronary artery without angina pectoris: Secondary | ICD-10-CM | POA: Diagnosis present

## 2021-09-02 DIAGNOSIS — E878 Other disorders of electrolyte and fluid balance, not elsewhere classified: Secondary | ICD-10-CM | POA: Diagnosis present

## 2021-09-02 DIAGNOSIS — N390 Urinary tract infection, site not specified: Secondary | ICD-10-CM | POA: Diagnosis present

## 2021-09-02 DIAGNOSIS — K219 Gastro-esophageal reflux disease without esophagitis: Secondary | ICD-10-CM | POA: Diagnosis present

## 2021-09-02 DIAGNOSIS — B359 Dermatophytosis, unspecified: Secondary | ICD-10-CM | POA: Diagnosis present

## 2021-09-02 DIAGNOSIS — I110 Hypertensive heart disease with heart failure: Secondary | ICD-10-CM | POA: Diagnosis present

## 2021-09-02 DIAGNOSIS — S32502D Unspecified fracture of left pubis, subsequent encounter for fracture with routine healing: Secondary | ICD-10-CM | POA: Diagnosis not present

## 2021-09-02 LAB — BASIC METABOLIC PANEL
Anion gap: 7 (ref 5–15)
BUN: 9 mg/dL (ref 6–20)
CO2: 28 mmol/L (ref 22–32)
Calcium: 8 mg/dL — ABNORMAL LOW (ref 8.9–10.3)
Chloride: 94 mmol/L — ABNORMAL LOW (ref 98–111)
Creatinine, Ser: 0.72 mg/dL (ref 0.44–1.00)
GFR, Estimated: >60 mL/min (ref >60)
Glucose, Bld: 127 mg/dL — ABNORMAL HIGH (ref 70–99)
Potassium: 3.7 mmol/L (ref 3.5–5.1)
Sodium: 129 mmol/L — ABNORMAL LOW (ref 135–145)

## 2021-09-02 LAB — CBC
HCT: 30.6 % — ABNORMAL LOW (ref 36.0–46.0)
Hemoglobin: 9.7 g/dL — ABNORMAL LOW (ref 12.0–15.0)
MCH: 27.7 pg (ref 26.0–34.0)
MCHC: 31.7 g/dL (ref 30.0–36.0)
MCV: 87.4 fL (ref 80.0–100.0)
Platelets: 334 10*3/uL (ref 150–400)
RBC: 3.5 MIL/uL — ABNORMAL LOW (ref 3.87–5.11)
RDW: 15.4 % (ref 11.5–15.5)
WBC: 18.1 10*3/uL — ABNORMAL HIGH (ref 4.0–10.5)
nRBC: 0 % (ref 0.0–0.2)

## 2021-09-02 LAB — GLUCOSE, CAPILLARY
Glucose-Capillary: 127 mg/dL — ABNORMAL HIGH (ref 70–99)
Glucose-Capillary: 160 mg/dL — ABNORMAL HIGH (ref 70–99)
Glucose-Capillary: 176 mg/dL — ABNORMAL HIGH (ref 70–99)
Glucose-Capillary: 106 mg/dL — ABNORMAL HIGH (ref 70–99)

## 2021-09-02 LAB — COMPREHENSIVE METABOLIC PANEL
ALT: 15 U/L (ref 0–44)
AST: 17 U/L (ref 15–41)
Albumin: 2.2 g/dL — ABNORMAL LOW (ref 3.5–5.0)
Alkaline Phosphatase: 77 U/L (ref 38–126)
Anion gap: 10 (ref 5–15)
BUN: 9 mg/dL (ref 6–20)
CO2: 26 mmol/L (ref 22–32)
Calcium: 7.7 mg/dL — ABNORMAL LOW (ref 8.9–10.3)
Chloride: 92 mmol/L — ABNORMAL LOW (ref 98–111)
Creatinine, Ser: 0.67 mg/dL (ref 0.44–1.00)
GFR, Estimated: >60 mL/min (ref >60)
Glucose, Bld: 113 mg/dL — ABNORMAL HIGH (ref 70–99)
Potassium: 2.5 mmol/L — CL (ref 3.5–5.1)
Sodium: 128 mmol/L — ABNORMAL LOW (ref 135–145)
Total Bilirubin: 0.6 mg/dL (ref 0.3–1.2)
Total Protein: 5.6 g/dL — ABNORMAL LOW (ref 6.5–8.1)

## 2021-09-02 LAB — HIV ANTIBODY (ROUTINE TESTING W REFLEX): HIV Screen 4th Generation wRfx: NONREACTIVE

## 2021-09-02 LAB — MAGNESIUM: Magnesium: 1.9 mg/dL (ref 1.7–2.4)

## 2021-09-02 MED ORDER — POTASSIUM CHLORIDE CRYS ER 20 MEQ PO TBCR
40.0000 meq | EXTENDED_RELEASE_TABLET | Freq: Once | ORAL | Status: DC
  Filled 2021-09-02: qty 2

## 2021-09-02 MED ORDER — POTASSIUM CHLORIDE 10 MEQ/100ML IV SOLN
10.0000 meq | INTRAVENOUS | Status: AC
  Administered 2021-09-02 (×3): 10 meq via INTRAVENOUS
  Filled 2021-09-02 (×3): qty 100

## 2021-09-02 MED ORDER — DULOXETINE HCL 20 MG PO CPEP
40.0000 mg | ORAL_CAPSULE | Freq: Every day | ORAL | Status: DC
  Administered 2021-09-02 – 2021-09-04 (×3): 40 mg via ORAL
  Filled 2021-09-02 (×2): qty 2

## 2021-09-02 MED ORDER — HYDROXYZINE HCL 10 MG PO TABS
10.0000 mg | ORAL_TABLET | Freq: Once | ORAL | Status: AC
  Administered 2021-09-02: 10 mg via ORAL
  Filled 2021-09-02: qty 1

## 2021-09-02 MED ORDER — ENSURE ENLIVE PO LIQD
237.0000 mL | Freq: Two times a day (BID) | ORAL | Status: DC
  Administered 2021-09-02 – 2021-09-04 (×3): 237 mL via ORAL

## 2021-09-02 MED ORDER — KETOCONAZOLE 2 % EX CREA
1.0000 "application " | TOPICAL_CREAM | Freq: Two times a day (BID) | CUTANEOUS | Status: DC
  Administered 2021-09-02 – 2021-09-04 (×5): 1 via TOPICAL
  Filled 2021-09-02: qty 15

## 2021-09-02 MED ORDER — POTASSIUM CHLORIDE CRYS ER 20 MEQ PO TBCR
40.0000 meq | EXTENDED_RELEASE_TABLET | Freq: Once | ORAL | Status: AC
  Administered 2021-09-02: 40 meq via ORAL
  Filled 2021-09-02: qty 2

## 2021-09-02 NOTE — Plan of Care (Signed)
  Problem: Acute Rehab PT Goals(only PT should resolve) Goal: Pt Will Go Supine/Side To Sit Outcome: Progressing Flowsheets (Taken 09/02/2021 1016) Pt will go Supine/Side to Sit:  with modified independence  with supervision Goal: Patient Will Transfer Sit To/From Stand Outcome: Progressing Flowsheets (Taken 09/02/2021 1016) Patient will transfer sit to/from stand:  with supervision  with min guard assist Goal: Pt Will Transfer Bed To Chair/Chair To Bed Outcome: Progressing Flowsheets (Taken 09/02/2021 1016) Pt will Transfer Bed to Chair/Chair to Bed:  min guard assist  with min assist Goal: Pt Will Perform Standing Balance Or Pre-Gait Outcome: Progressing Flowsheets (Taken 09/02/2021 1016) Pt will perform standing balance or pre-gait:  1-2 min  with Supervision  with min guard assist  with bilateral UE support Goal: Pt Will Ambulate Outcome: Progressing Flowsheets (Taken 09/02/2021 1016) Pt will Ambulate:  50 feet  with min guard assist  with minimal assist  with rolling walker   10:17 AM, 09/02/21 Gina Bradley, S/PT

## 2021-09-02 NOTE — Plan of Care (Signed)
  Problem: Acute Rehab OT Goals (only OT should resolve) Goal: Pt. Will Perform Grooming Flowsheets (Taken 09/02/2021 1146) Pt Will Perform Grooming:  with supervision  standing Goal: Pt. Will Perform Lower Body Bathing Flowsheets (Taken 09/02/2021 1146) Pt Will Perform Lower Body Bathing:  with modified independence  with supervision Goal: Pt. Will Perform Lower Body Dressing Flowsheets (Taken 09/02/2021 1146) Pt Will Perform Lower Body Dressing:  with modified independence  with supervision Goal: Pt. Will Transfer To Toilet Flowsheets (Taken 09/02/2021 1146) Pt Will Transfer to Toilet:  with modified independence  with supervision Goal: Pt. Will Perform Toileting-Clothing Manipulation Flowsheets (Taken 09/02/2021 1146) Pt Will Perform Toileting - Clothing Manipulation and hygiene:  with supervision  with modified independence Goal: Pt/Caregiver Will Perform Home Exercise Program Flowsheets (Taken 09/02/2021 1146) Pt/caregiver will Perform Home Exercise Program:  Increased strength  Both right and left upper extremity  Independently  Hiedi Touchton OT, MOT

## 2021-09-02 NOTE — [place] FL2 (Signed)
[name] MEDICAID FL2 LEVEL OF CARE SCREENING TOOL     IDENTIFICATION  Patient Name: Gina Bradley Birthdate: 07/11/1960 Sex: female Admission Date (Current Location): 09/01/2021  [facility] and [place] Number:  [employer] and Address:  [place] [address] [place]      Provider Number: [id number]  Attending Physician Name and Address:  Roxan Hockey, MD  Relative Name and Phone Number:  [phone]    Current Level of Care: Hospital Recommended Level of Care: [place] Prior Approval Number:    Date Approved/Denied:   PASRR Number:    Discharge Plan: SNF    Current Diagnoses: Patient Active Problem List   Diagnosis Date Noted   Hyponatremia 09/01/2021   Hypomagnesemia 09/01/2021   Normocytic anemia 09/01/2021   Sepsis secondary to UTI ([place]) 09/01/2021   Rib fractures 09/01/2021   Sepsis ([place])    Acute metabolic encephalopathy 99991111   Opioid dependence ([place]) 06/23/2021   Multifocal pneumonia 06/23/2021   Obesity, Class III, BMI 40-49.9 (morbid obesity) ([place]) 06/23/2021   Transaminasemia 06/23/2021   Acute respiratory failure with hypoxia and hypercarbia ([place]) 06/22/2021   Severe sepsis ([place]) 06/22/2021   Right subacute Inferior pubic ramus fracture 06/22/2021   Endometrial thickening on CT scan 06/22/2021   Chronic heart failure with preserved ejection fraction (HFpEF) ([place]) 06/22/2021   Uncontrolled type 2 diabetes mellitus with hyperglycemia, without long-term current use of insulin ([place]) 08/18/2020   Tick bite of left lower leg    Peripheral edema 08/17/2020   Hypokalemia    Class 1 obesity due to excess calories with body mass index (BMI) of 33.0 to 33.9 in adult    Cellulitis    Acidosis, metabolic [id number]   PMB (postmenopausal bleeding) 10/06/2016   Chest pressure 01/05/2015   Gastric erosion    Encounter for screening colonoscopy 01/22/2014   Constipation 01/20/2014   GERD  (gastroesophageal reflux disease) 01/20/2014   Dyspepsia 01/20/2014   PSVT (paroxysmal supraventricular tachycardia) ([place]) 05/31/2013   HTN (hypertension), benign 05/31/2013   Hypercholesteremia 05/31/2013    Orientation RESPIRATION BLADDER Height & Weight     Self, Time, Situation, Place  Normal Incontinent Weight: 201 lb 15.1 oz (91.6 kg) Height:  5\' 1"  (154.9 cm)  BEHAVIORAL SYMPTOMS/MOOD NEUROLOGICAL BOWEL NUTRITION STATUS      Continent Diet (Heart healthy)  AMBULATORY STATUS COMMUNICATION OF NEEDS Skin   Extensive Assist Verbally Normal (Bilateral irritant dermatitis to groin)                       Personal Care Assistance Level of Assistance  Bathing, Feeding, Dressing Bathing Assistance: Limited assistance Feeding assistance: Independent Dressing Assistance: Limited assistance     Functional Limitations Info  Sight, Hearing, Speech Sight Info: Adequate Hearing Info: Adequate Speech Info: Adequate    SPECIAL CARE FACTORS FREQUENCY  PT (By licensed PT), OT (By licensed OT)     PT Frequency: 5 times weekly OT Frequency: 5 times weekly            Contractures Contractures Info: Not present    Additional Factors Info  Code Status, Allergies Code Status Info: FULL Allergies Info: Bee venom and Tdap (tetanus-diphth-acell Pertussis)           Current Medications (09/02/2021):  This is the current hospital active medication list Current Facility-Administered Medications  Medication Dose Route Frequency Provider Last Rate Last Admin   0.9 %  sodium chloride infusion  250 mL  Intravenous PRN Roxan Hockey, MD       acetaminophen (TYLENOL) tablet 650 mg  650 mg Oral Q6H PRN Emokpae, Courage, MD       Or   acetaminophen (TYLENOL) suppository 650 mg  650 mg Rectal Q6H PRN Emokpae, Courage, MD       albuterol (PROVENTIL) (2.5 MG/3ML) 0.083% nebulizer solution 2.5 mg  2.5 mg Nebulization Q6H PRN Jani Gravel, MD       aspirin EC tablet 81 mg  81 mg Oral Daily  Emokpae, Courage, MD   81 mg at 09/02/21 0930   bisacodyl (DULCOLAX) suppository 10 mg  10 mg Rectal Daily PRN Emokpae, Courage, MD       cefTRIAXone (ROCEPHIN) 1 g in sodium chloride 0.9 % 100 mL IVPB  1 g Intravenous Q24H Emokpae, Courage, MD 200 mL/hr at 09/02/21 1047 1 g at 09/02/21 1047   clonazePAM (KLONOPIN) disintegrating tablet 0.25 mg  0.25 mg Oral BID PRN Adefeso, Oladapo, DO   0.25 mg at 09/02/21 0929   DULoxetine (CYMBALTA) DR capsule 40 mg  40 mg Oral Daily Jani Gravel, MD   40 mg at 09/02/21 0930   feeding supplement (ENSURE ENLIVE / ENSURE PLUS) liquid 237 mL  237 mL Oral BID BM Jani Gravel, MD       furosemide (LASIX) tablet 20 mg  20 mg Oral Daily Jani Gravel, MD   20 mg at 09/02/21 0929   gabapentin (NEURONTIN) capsule 300 mg  300 mg Oral Q8H Emokpae, Courage, MD   300 mg at 09/02/21 0503   heparin injection 5,000 Units  5,000 Units Subcutaneous Q8H Emokpae, Courage, MD   5,000 Units at 09/02/21 0503   insulin aspart (novoLOG) injection 0-5 Units  0-5 Units Subcutaneous QHS Emokpae, Courage, MD       insulin aspart (novoLOG) injection 0-9 Units  0-9 Units Subcutaneous TID WC Emokpae, Courage, MD   1 Units at 09/02/21 0714   ketoconazole (NIZORAL) 2 % cream 1 application.  1 application. Topical BID Jani Gravel, MD   1 application. at 09/02/21 1046   magnesium oxide (MAG-OX) tablet 400 mg  400 mg Oral Daily Emokpae, Courage, MD   400 mg at 09/02/21 0930   metoprolol tartrate (LOPRESSOR) tablet 25 mg  25 mg Oral BID Emokpae, Courage, MD   25 mg at 09/02/21 0929   ondansetron (ZOFRAN) tablet 4 mg  4 mg Oral Q6H PRN Emokpae, Courage, MD       Or   ondansetron (ZOFRAN) injection 4 mg  4 mg Intravenous Q6H PRN Emokpae, Courage, MD       oxyCODONE (Oxy IR/ROXICODONE) immediate release tablet 10 mg  10 mg Oral QID PRN [place] Brick, Courage, MD   10 mg at 09/02/21 0929   pantoprazole (PROTONIX) EC tablet 40 mg  40 mg Oral Daily Emokpae, Courage, MD   40 mg at 09/02/21 0930   polyethylene glycol  (MIRALAX / GLYCOLAX) packet 17 g  17 g Oral Daily PRN Emokpae, Courage, MD       potassium chloride SA (KLOR-CON M) CR tablet 40 mEq  40 mEq Oral Once Emokpae, Courage, MD       primidone (MYSOLINE) tablet 50 mg  50 mg Oral QHS Emokpae, Courage, MD   50 mg at 09/01/21 2055   sodium chloride flush (NS) 0.9 % injection 3 mL  3 mL Intravenous Q12H Emokpae, Courage, MD   3 mL at 09/02/21 0931   sodium chloride flush (NS) 0.9 % injection 3 mL  3 mL Intravenous Q12H Emokpae, Courage, MD   3 mL at 09/02/21 0931   sodium chloride flush (NS) 0.9 % injection 3 mL  3 mL Intravenous PRN Emokpae, Courage, MD       traZODone (DESYREL) tablet 50 mg  50 mg Oral QHS PRN [place] Brick, Courage, MD   50 mg at 09/01/21 2013   umeclidinium-vilanterol (ANORO ELLIPTA) 62.5-25 MCG/ACT 1 puff  1 puff Inhalation Daily Jani Gravel, MD   1 puff at 09/02/21 0859     Discharge Medications: Please see discharge summary for a list of discharge medications.  Relevant Imaging Results:  Relevant Lab Results:   Additional Information SSN: [id number].  Iona Beard, LCSWA

## 2021-09-02 NOTE — Evaluation (Signed)
Physical Therapy Evaluation Patient Details Name: Gina Bradley MRN: [id number] DOB: 09-02-1960 Today's Date: 09/02/2021  History of Present Illness  Gina Bradley  is a 61 y.o. female w Hypertension, Dm2, HFpEF, steroid dependent Copd, opiate dependence L4 compression fracture/ pubic rami fracture 06/06/21, SVT 06/22/21, septic shock from pneumonia 06/22/21 s/p intubation discharged to SNF 08/04/21, who apparently went home from SNF 3 days ago and c/o weakness.  Pt denies fever, chills, cough, cp, palp, sob, diarrhea, dysuria, hematuria, flank pain, and notes increased weakness.  Unable to Pt takes lasix at home and is not on any potassium supplementation   Clinical Impression  Patient is modified independent with bed mobility requiring extended time to complete task secondary to decreased trunk control and generalized weakness. Patient is min guard/assist with transfers with use of RW. Patient was able to complete transfer without RW but was very unsteady and high fall risk, in which transfer was repeated with RW for safety. Patient demonstrated mild labored movement with transfers primarily due to balance and strength deficits. Patient was limited to 10 steps with ambulation in room with RW and min guard/assist provided by PT. Patient struggles with weight shifting and stepping outside base of support resulting in unsteadiness during gait. Patient demonstrates minor gait pattern deficits but was primarily limited by fatigue. Patient left in chair with nursing notified of mobility status. Patient will benefit from continued skilled physical therapy in hospital and recommended venue below to increase strength, balance, endurance for safe ADLs and gait.      Recommendations for follow up therapy are one component of a multi-disciplinary discharge planning process, led by the attending physician.  Recommendations may be updated based on patient status, additional functional criteria and insurance  authorization.  Follow Up Recommendations Skilled nursing-short term rehab (<3 hours/day)    Assistance Recommended at Discharge Set up Supervision/Assistance  Patient can return home with the following  Help with stairs or ramp for entrance;A little help with walking and/or transfers;A little help with bathing/dressing/bathroom    Equipment Recommendations None recommended by PT  Recommendations for Other Services       Functional Status Assessment Patient has had a recent decline in their functional status and demonstrates the ability to make significant improvements in function in a reasonable and predictable amount of time.     Precautions / Restrictions Precautions Precautions: Fall Restrictions Weight Bearing Restrictions: No      Mobility  Bed Mobility Overal bed mobility: Modified Independent             General bed mobility comments: Patient is modified independent with bed mobility but requires extended amount of time to complete supine to sit.    Transfers Overall transfer level: Needs assistance Equipment used: Rolling walker (2 wheels) Transfers: Sit to/from Stand, Bed to chair/wheelchair/BSC Sit to Stand: Min guard, Min assist   Step pivot transfers: Min guard, Min assist       General transfer comment: Patient able to perform transfers with min guard/assist without RW, but was very unsteady and high fall risk. RW used to perform transfer again and patient was more steady but still limited by balance deficits and generalized weakness.    Ambulation/Gait Ambulation/Gait assistance: Min guard, Min assist Gait Distance (Feet): 10 Feet Assistive device: Rolling walker (2 wheels) Gait Pattern/deviations: Step-to pattern, Decreased step length - right, Decreased step length - left, Decreased stride length, Decreased weight shift to left, Decreased weight shift to right Gait velocity: decreased     General  Gait Details: Patient was able to ambulate 10  feet in room with min guard/assist and RW utilized. Patient has difficulty weight shifting and demonstrates strength deficits impacting gait pattern. Patient was primarily limited by fatigue.  Stairs            Wheelchair Mobility    Modified Rankin (Stroke Patients Only)       Balance Overall balance assessment: Needs assistance Sitting-balance support: Bilateral upper extremity supported, Feet supported Sitting balance-Leahy Scale: Fair Sitting balance - Comments: Patient able to sit EOB with UE support   Standing balance support: Bilateral upper extremity supported, During functional activity, Reliant on assistive device for balance Standing balance-Leahy Scale: Poor Standing balance comment: Patient reliant on RW with forward trunk lean and has difficulty reaching outside BOS in standing.                             Pertinent Vitals/Pain Pain Assessment Pain Assessment: 0-10 Pain Score: 6  Pain Location: L side rib pain Pain Descriptors / Indicators: Stabbing Pain Intervention(s): Limited activity within patient's tolerance, Monitored during session, Repositioned    Home Living Family/patient expects to be discharged to:: Private residence Living Arrangements: Spouse/significant other Available Help at Discharge: Family;Available PRN/intermittently Type of Home: House Home Access: Stairs to enter   Entrance Stairs-Number of Steps: 1   Home Layout: One level Home Equipment: BSC/3in1      Prior Function Prior Level of Function : Needs assist       Physical Assist : Mobility (physical);ADLs (physical) Mobility (physical): Gait;Stairs ADLs (physical): IADLs Mobility Comments: Patient reports being able to ambualte at home but rarely ambulates in the community. ADLs Comments: Patient reports being able to perform ADL's independently with husband around for assistance prn.     Hand Dominance        Extremity/Trunk Assessment   Upper Extremity  Assessment Upper Extremity Assessment: Defer to OT evaluation    Lower Extremity Assessment Lower Extremity Assessment: Generalized weakness    Cervical / Trunk Assessment Cervical / Trunk Assessment: Normal  Communication   Communication: No difficulties  Cognition Arousal/Alertness: Awake/alert Behavior During Therapy: WFL for tasks assessed/performed Overall Cognitive Status: Within Functional Limits for tasks assessed                                          General Comments      Exercises     Assessment/Plan    PT Assessment Patient needs continued PT services;All further PT needs can be met in the next venue of care  PT Problem List Decreased strength;Decreased activity tolerance;Decreased balance;Decreased mobility;Decreased coordination       PT Treatment Interventions DME instruction;Gait training;Functional mobility training;Therapeutic activities;Therapeutic exercise;Balance training    PT Goals (Current goals can be found in the Care Plan section)  Acute Rehab PT Goals Patient Stated Goal: return home PT Goal Formulation: With patient Time For Goal Achievement: 09/16/21 Potential to Achieve Goals: Fair    Frequency Min 3X/week     Co-evaluation PT/OT/SLP Co-Evaluation/Treatment: Yes Reason for Co-Treatment: To address functional/ADL transfers PT goals addressed during session: Mobility/safety with mobility;Balance;Proper use of DME;Strengthening/ROM         AM-PAC PT "6 Clicks" Mobility  Outcome Measure Help needed turning from your back to your side while in a flat bed without using bedrails?: None Help needed moving  from lying on your back to sitting on the side of a flat bed without using bedrails?: A Little Help needed moving to and from a bed to a chair (including a wheelchair)?: A Little Help needed standing up from a chair using your arms (e.g., wheelchair or bedside chair)?: A Little Help needed to walk in hospital room?:  A Little Help needed climbing 3-5 steps with a railing? : A Lot 6 Click Score: 18    End of Session Equipment Utilized During Treatment: Gait belt Activity Tolerance: Patient tolerated treatment well;No increased pain;Patient limited by fatigue Patient left: in chair;with call bell/phone within reach Nurse Communication: Mobility status PT Visit Diagnosis: Unsteadiness on feet (R26.81);Other abnormalities of gait and mobility (R26.89);Muscle weakness (generalized) (M62.81)    Time: 3875-6433 PT Time Calculation (min) (ACUTE ONLY): 30 min   Charges:   PT Evaluation $PT Eval Moderate Complexity: 1 Mod PT Treatments $Therapeutic Activity: 23-37 mins        10:15 AM, 09/02/21 Lestine Box, S/PT

## 2021-09-02 NOTE — Evaluation (Signed)
Occupational Therapy Evaluation Patient Details Name: Gina Bradley MRN: [id number] DOB: Aug 14, 1960 Today's Date: 09/02/2021   History of Present Illness Gina Bradley  is a 61 y.o. female w Hypertension, Dm2, HFpEF, steroid dependent Copd, opiate dependence L4 compression fracture/ pubic rami fracture 06/06/21, SVT 06/22/21, septic shock from pneumonia 06/22/21 s/p intubation discharged to SNF 08/04/21, who apparently went home from SNF 3 days ago and c/o weakness.  Pt denies fever, chills, cough, cp, palp, sob, diarrhea, dysuria, hematuria, flank pain, and notes increased weakness.  Unable to Pt takes lasix at home and is not on any potassium supplementation   Clinical Impression   Pt agreeable to OT and PT co-evaluation. Pt presents reports independents with ADL's at baseline but today required min guard to min A for standing tasks and transfers and moderate assist for lower body dressing. Pt demonstrates fair sitting balance and poor standing balance. Pt able to ambulate a very short distance in the room before needing to turn around and sit in the chair. Extended time needed for transfers due to labored movement and increase in pain. Pt will benefit from continued OT in the hospital and recommended venue below to increase strength, balance, and endurance for safe ADL's.         Recommendations for follow up therapy are one component of a multi-disciplinary discharge planning process, led by the attending physician.  Recommendations may be updated based on patient status, additional functional criteria and insurance authorization.   Follow Up Recommendations  Skilled nursing-short term rehab (<3 hours/day)    Assistance Recommended at Discharge Intermittent Supervision/Assistance  Patient can return home with the following A little help with walking and/or transfers;A little help with bathing/dressing/bathroom;Assistance with cooking/housework;Assist for transportation;Help with stairs or ramp for  entrance    Functional Status Assessment  Patient has had a recent decline in their functional status and demonstrates the ability to make significant improvements in function in a reasonable and predictable amount of time.  Equipment Recommendations  None recommended by OT    Recommendations for Other Services       Precautions / Restrictions Precautions Precautions: Fall Restrictions Weight Bearing Restrictions: No      Mobility Bed Mobility Overal bed mobility: Modified Independent             General bed mobility comments: As per PT note    Transfers Overall transfer level: Needs assistance Equipment used: Rolling walker (2 wheels) Transfers: Sit to/from Stand, Bed to chair/wheelchair/BSC Sit to Stand: Min guard, Min assist     Step pivot transfers: Min guard, Min assist     General transfer comment: as per PT note      Balance Overall balance assessment: Needs assistance Sitting-balance support: Bilateral upper extremity supported, Feet supported Sitting balance-Leahy Scale: Fair Sitting balance - Comments: Patient able to sit EOB with UE support   Standing balance support: Bilateral upper extremity supported, During functional activity, Reliant on assistive device for balance Standing balance-Leahy Scale: Poor Standing balance comment: Patient reliant on RW with forward trunk lean and has difficulty reaching outside BOS in standing.                           ADL either performed or assessed with clinical judgement   ADL Overall ADL's : Needs assistance/impaired     Grooming: Minimal assistance;Standing   Upper Body Bathing: Set up;Sitting   Lower Body Bathing: Moderate assistance;Sitting/lateral leans;Minimal assistance   Upper Body Dressing :  Set up;Sitting   Lower Body Dressing: Moderate assistance;Sitting/lateral leans Lower Body Dressing Details (indicate cue type and reason): Pt able to don R sock but needed assist to don left  seated at EOB. Toilet Transfer: Minimal assistance;Min Manufacturing systems engineerguard;Stand-pivot Toilet Transfer Details (indicate cue type and reason): [facility] with RW Toileting- Clothing Manipulation and Hygiene: Minimal assistance;Moderate assistance;Sitting/lateral lean;Sit to/from stand       Functional mobility during ADLs: Min guard;Minimal assistance;Rolling walker (2 wheels) General ADL Comments: Pt demonstrates labored movement and increase in pain and poor endurance in standing tasks and transfers. Limited for LBD via L LE lacerations.     Vision Baseline Vision/History: 1 Wears glasses Ability to See in Adequate Light: 1 Impaired Patient Visual Report: Blurring of vision (Pt reports history of intermittent blurry vision. Vision is normal at this time.) Vision Assessment?: No apparent visual deficits                Pertinent Vitals/Pain Pain Assessment Pain Assessment: 0-10 Pain Score: 6  Pain Location: L side rib pain Pain Descriptors / Indicators: Stabbing Pain Intervention(s): Limited activity within patient's tolerance, Monitored during session, Repositioned     Hand Dominance Right   Extremity/Trunk Assessment Upper Extremity Assessment Upper Extremity Assessment: Generalized weakness   Lower Extremity Assessment Lower Extremity Assessment: Defer to PT evaluation   Cervical / Trunk Assessment Cervical / Trunk Assessment: Normal   Communication Communication Communication: No difficulties   Cognition Arousal/Alertness: Awake/alert Behavior During Therapy: WFL for tasks assessed/performed Overall Cognitive Status: Within Functional Limits for tasks assessed                                                        Home Living Family/patient expects to be discharged to:: Private residence Living Arrangements: Spouse/significant other Available Help at Discharge: Family;Available PRN/intermittently Type of Home: House Home Access: Stairs to  enter Enter[employer] CorporationEntrance Stairs-Number of Steps: 1 Entrance Stairs-Rails: None Home Layout: One level     Bathroom Shower/Tub: Chief Strategy OfficerTub/shower unit   Bathroom Toilet: Standard Bathroom Accessibility: Yes How Accessible: Accessible via walker Home Equipment: BSC/3in1;Other (comment) (Pt unsure if she has a RW or not. Hemi walker)          Prior Functioning/Environment Prior Level of Function : Needs assist       Physical Assist : Mobility (physical);ADLs (physical) Mobility (physical): Gait;Stairs ADLs (physical): IADLs Mobility Comments: Patient reports being able to ambualte at home typically without AD but rarely ambulates in the community. ADLs Comments: Patient reports being able to perform ADL's independently with husband around for assistance prn. Assisted for IADL's.        OT Problem List: Decreased strength;Decreased activity tolerance;Impaired balance (sitting and/or standing)      OT Treatment/Interventions: Self-care/ADL training;Therapeutic exercise;Therapeutic activities;Patient/family education;Balance training;DME and/or AE instruction    OT Goals(Current goals can be found in the care plan section) Acute Rehab OT Goals Patient Stated Goal: return home OT Goal Formulation: With patient Time For Goal Achievement: 09/16/21 Potential to Achieve Goals: Good  OT Frequency: Min 2X/week    Co-evaluation PT/OT/SLP Co-Evaluation/Treatment: Yes Reason for Co-Treatment: To address functional/ADL transfers PT goals addressed during session: Mobility/safety with mobility;Balance;Proper use of DME;Strengthening/ROM OT goals addressed during session: ADL's and self-care      AM-PAC OT "6 Clicks" Daily Activity     Outcome  Measure Help from another person eating meals?: None Help from another person taking care of personal grooming?: A Little Help from another person toileting, which includes using toliet, bedpan, or urinal?: A Little Help from another person bathing (including  washing, rinsing, drying)?: A Lot Help from another person to put on and taking off regular upper body clothing?: A Little Help from another person to put on and taking off regular lower body clothing?: A Lot 6 Click Score: 17   End of Session Equipment Utilized During Treatment: Rolling walker (2 wheels)  Activity Tolerance: Patient tolerated treatment well Patient left: in chair;with call bell/phone within reach  OT Visit Diagnosis: Unsteadiness on feet (R26.81);History of falling (Z91.81);Muscle weakness (generalized) (M62.81);Other abnormalities of gait and mobility (R26.89)                Time: 6962-9528 OT Time Calculation (min): 26 min Charges:  OT General Charges $OT Visit: 1 Visit OT Evaluation $OT Eval Low Complexity: 1 Low  Sukhraj Esquivias OT, MOT  Danie Chandler 09/02/2021, 11:42 AM

## 2021-09-02 NOTE — Progress Notes (Signed)
Critical lab called in of 2.5 potasium. Verbally told on call who was on the floor. Awaiting new orders

## 2021-09-02 NOTE — Progress Notes (Addendum)
PROGRESS NOTE                                                                                                                                                                                                             Patient Demographics:    Gina Bradley, is a 61 y.o. female, DOB - June 25, 1960, [id number]  Admit date - 09/01/2021   Admitting Physician Channon Ambrosini Denton Brick, MD  Outpatient Primary MD for the patient is Redmond School, MD  LOS - 0  Outpatient Specialists:  Chief Complaint  Patient presents with   Weakness       Brief Narrative  61 y.o. female w Hypertension, Dm2, HFpEF, steroid dependent Copd, opiate dependence L4 compression fracture/ pubic rami fracture 06/06/21, SVT 06/22/21, septic shock from pneumonia 06/22/21 s/p intubation discharged to [facility] 08/04/21, who apparently went home from SNF 3 days ago and c/o generalized weakness.  Pt typically walks with walker at home.  Pt takes lasix at home and is not on any potassium supplementation.  pt denies any change to lasix dose.   In ED, pt noted to have potassium of 2.2, and also noted to have UTI   Subjective:    Greidy Logwood today states feeling a little stronger.  Pt notes that her skin is cracked under the toes on her left foot. Pt not sure how it got there.   No drainage, no erythema.    Pt denies dysuria, but notes frequency and incontinence, neither of which is new.  Pt denies fever, chills, flank pain, n/v.  No adverse affects noted from Rocephin.   Pt slept well but notes depression due to her health issues as well as not being able to see her children as often.  Denies si/hi.   --Dr. Jani Gravel saw and evaluated pt on 09/02/21 -Dr. Jani Gravel formulated plan of care for this patient-    Assessment  & Plan :    Principal Problem:   Hypokalemia Active Problems:   Hyponatremia   Hypomagnesemia   Normocytic anemia   Sepsis secondary to UTI [facility])   Rib  fractures   --Dr. Jani Gravel saw and evaluated pt on 09/02/21 -Dr. Jani Gravel formulated plan of care for this patient-   Hypokalemia Replete with 45meq Kcl iv x3 this AM as well as oral potassium 66meq po x1 Check bmp  at 1300 Check cmp in am   Hypomagnesemia Cont magnesium oxide 400mg  po qday   UTI, possible early sepsis (hypotension dbp 47, wbc 15.6, rr 21) Awaiting urine and blood cultures Pt received Rocephin 2gm iv x 1 in ED Cont Rocephin 1gm iv qday D2   Hyponatremia stable Pt will received ns iv with KCL Check cmp in am Consider further w/up if not improving   Anemia  Check cbc in am   Rib Fractures, pubic rami fractures, L4 compression fracture Cont Gabapentin 300mg  po q8h Cont oxycodone 10mg  po qid prn  Will need outpatient bone density test and antiresorptive agent   Copd /OSA on home o2 Cont prednisone 5mg  po qday Cont Anoro Albuterol neb q6h prn    HFpEF/CAD Cont Lasix 20mg  po qday Cont Aspirin Cont metoprolol 25mg  po bid   DM2 Cont ISS   RLS Cont Primidone   Anxiety/ Depression/ Insomnia Increase Duloxetine 20->40mg  po qday (6/1) Cont Clonazepam 0.25mg  po bid  Cont Trazodone 50mg  po qhs prn    Gerd Cont Protonix  Severe protein Calorie malnutrition Nutrition consult Start ensure liquid  Tinea Start Ketoconazole cream bid    Code Status : Full Code  Family Communication  :   called [name] [phone], no answer  Disposition Plan  :  ? SNF vs home  Barriers For Discharge :  deconditioning  Consults  :  PT to evaluate and tx, consider OT depending upon Transition consult recommendations, Nutrition consult  Procedures  :  CTA chest 5/31 Xray pelvis/hip 5/31  DVT Prophylaxis  :    Lab Results  Component Value Date   PLT 334 09/02/2021    Antibiotics  :  Rocephin 5/31->  Anti-infectives (From admission, onward)    Start     Dose/Rate Route Frequency Ordered Stop   09/02/21 1100  cefTRIAXone (ROCEPHIN) 1 g in sodium chloride  0.9 % 100 mL IVPB        1 g 200 mL/hr over 30 Minutes Intravenous Every 24 hours 09/01/21 1620 09/11/21 1059   09/01/21 1445  cefTRIAXone (ROCEPHIN) 2 g in sodium chloride 0.9 % 100 mL IVPB        2 g 200 mL/hr over 30 Minutes Intravenous  Once 09/01/21 1437 09/01/21 1526         Objective:   Vitals:   09/01/21 1500 09/01/21 1640 09/01/21 1951 09/02/21 0448  BP: (!) 110/52 (!) 129/58 (!) 100/50 104/65  Pulse: 79 87 99 (!) 110  Resp: 20 20 20 18   Temp:  97.9 F (36.6 C) 99.1 F (37.3 C) 99.9 F (37.7 C)  TempSrc:  Oral Oral   SpO2: 99% 97% 96% 92%  Weight:  91.6 kg    Height:  5\' 1"  (1.549 m)      Wt Readings from Last 3 Encounters:  09/01/21 91.6 kg  08/03/21 95.4 kg  06/06/21 99.8 kg     Intake/Output Summary (Last 24 hours) at 09/02/2021 0757 Last data filed at 09/02/2021 0500 Gross per 24 hour  Intake 1806.61 ml  Output 500 ml  Net 1306.61 ml     Physical Exam  Awake Alert, Oriented X 3,  Heent: anicteric, pupils 1.57mm symmetric Neck: no jvd, no bruit Heart: rrr s1, s2, no m/g/r Lung: ctab Abd: soft, obese, nt nd, +bs Ext: no c/c , slight edema on top of left foot Skin: venous stasis changes bilateral lower ext, slight bruise on left forearm, iv in the left forearm Slight cracks to skin under  the toes of the left foot ? Tinea    Data Review:    CBC Recent Labs  Lab 09/01/21 1144 09/02/21 0535  WBC 15.6* 18.1*  HGB 11.3* 9.7*  HCT 34.9* 30.6*  PLT 303 334  MCV 85.3 87.4  MCH 27.6 27.7  MCHC 32.4 31.7  RDW 15.2 15.4  LYMPHSABS 0.9  --   MONOABS 0.8  --   EOSABS 0.0  --   BASOSABS 0.0  --     Chemistries  Recent Labs  Lab 09/01/21 1144 09/02/21 0535  NA 128* 128*  K 2.2* 2.5*  CL 84* 92*  CO2 32 26  GLUCOSE 193* 113*  BUN 10 9  CREATININE 0.63 0.67  CALCIUM 8.1* 7.7*  MG 1.6* 1.9  AST 19 17  ALT 17 15  ALKPHOS 96 77  BILITOT 1.1 0.6    ------------------------------------------------------------------------------------------------------------------ No results for input(s): CHOL, HDL, LDLCALC, TRIG, CHOLHDL, LDLDIRECT in the last 72 hours.  Lab Results  Component Value Date   HGBA1C 7.5 (H) 06/23/2021   ------------------------------------------------------------------------------------------------------------------ No results for input(s): TSH, T4TOTAL, T3FREE, THYROIDAB in the last 72 hours.  Invalid input(s): FREET3 ------------------------------------------------------------------------------------------------------------------ No results for input(s): VITAMINB12, FOLATE, FERRITIN, TIBC, IRON, RETICCTPCT in the last 72 hours.  Coagulation profile Recent Labs  Lab 09/01/21 1144  INR 1.0    Recent Labs    09/01/21 1144  DDIMER >20.00*    Cardiac Enzymes No results for input(s): CKMB, TROPONINI, MYOGLOBIN in the last 168 hours.  Invalid input(s): CK ------------------------------------------------------------------------------------------------------------------    Component Value Date/Time   BNP 23.2 07/31/2021 0103    Inpatient Medications  Scheduled Meds:  aspirin EC  81 mg Oral Daily   DULoxetine  20 mg Oral Daily   feeding supplement  237 mL Oral BID BM   furosemide  20 mg Oral Daily   gabapentin  300 mg Oral Q8H   heparin  5,000 Units Subcutaneous Q8H   insulin aspart  0-5 Units Subcutaneous QHS   insulin aspart  0-9 Units Subcutaneous TID WC   magnesium oxide  400 mg Oral Daily   metoprolol tartrate  25 mg Oral BID   pantoprazole  40 mg Oral Daily   primidone  50 mg Oral QHS   sodium chloride flush  3 mL Intravenous Q12H   sodium chloride flush  3 mL Intravenous Q12H   umeclidinium-vilanterol  1 puff Inhalation Daily   Continuous Infusions:  sodium chloride     0.9 % NaCl with KCl 20 mEq / L 75 mL/hr at 09/02/21 0502   cefTRIAXone (ROCEPHIN)  IV     potassium chloride 10 mEq  (09/02/21 0703)   PRN Meds:.sodium chloride, acetaminophen **OR** acetaminophen, albuterol, bisacodyl, clonazePAM, ondansetron **OR** ondansetron (ZOFRAN) IV, oxyCODONE, polyethylene glycol, sodium chloride flush, traZODone  Micro Results Recent Results (from the past 240 hour(s))  Culture, blood (Routine X 2) w Reflex to ID Panel     Status: None (Preliminary result)   Collection Time: 09/01/21  2:46 PM   Specimen: BLOOD  Result Value Ref Range Status   Specimen Description BLOOD BLOOD RIGHT HAND  Final   Special Requests   Final    BOTTLES DRAWN AEROBIC ONLY Blood Culture results may not be optimal due to an inadequate volume of blood received in culture bottles   Culture   Final    NO GROWTH < 24 HOURS Performed at [facility], [address], [place], [place] [address]    Report Status PENDING  Incomplete  Culture, blood (Routine X 2) w Reflex to ID Panel     Status: None (Preliminary result)   Collection Time: 09/01/21  2:51 PM   Specimen: BLOOD  Result Value Ref Range Status   Specimen Description BLOOD BLOOD LEFT HAND  Final   Special Requests   Final    BOTTLES DRAWN AEROBIC ONLY Blood Culture adequate volume   Culture   Final    NO GROWTH < 24 HOURS Performed at [facility], [address] [place], [place] [address]    Report Status PENDING  Incomplete    Radiology Reports CT Angio Chest PE W and/or Wo Contrast  Result Date: 09/01/2021 CLINICAL DATA:  Elevated D-dimer level. Patient found on floor, stool incontinence EXAM: CT ANGIOGRAPHY CHEST WITH CONTRAST TECHNIQUE: Multidetector CT imaging of the chest was performed using the standard protocol during bolus administration of intravenous contrast. Multiplanar CT image reconstructions and MIPs were obtained to evaluate the vascular anatomy. RADIATION DOSE REDUCTION: This exam was performed according to the departmental dose-optimization program which includes automated exposure control, adjustment of the mA and/or kV  according to patient size and/or use of iterative reconstruction technique. CONTRAST:  52mL OMNIPAQUE IOHEXOL 350 MG/ML SOLN COMPARISON:  09/01/2021 and 07/03/2021 FINDINGS: Cardiovascular: No filling defect is identified in the pulmonary arterial tree to suggest pulmonary embolus. Mild aortic atherosclerotic vascular calcification. Mild cardiomegaly. Mediastinum/Nodes: Unremarkable Lungs/Pleura: Ground-glass and linear opacities in the perihilar regions with the airspace opacity substantially improved compared to 07/03/2021. Much of the nodular component of airspace opacity shown on prior exam has resolved. No pleural effusion. Upper Abdomen: Nonspecific questionable 1.3 cm ill-defined hypodensity centrally in the lateral segment left hepatic lobe on image 89 series 4. The upper pole the left kidney is substantially more hypodense than the upper pole the right kidney. I am uncertain whether this is related to the very early phase of contrast or if there is an inflammatory process such as pyelonephritis, correlate with urine analysis. Musculoskeletal: Numerous bilateral rib fractures in various states of healing. There are some new fractures visible compared to 07/03/2021 including the right anterior fifth and sixth ribs as well as the left anterior fourth rib. Review of the MIP images confirms the above findings. IMPRESSION: 1. No filling defect is identified in the pulmonary arterial tree to suggest pulmonary embolus. 2. Substantial improvement in bilateral airspace opacities compared to 07/03/2021. 3. Numerous healing bilateral rib fractures. Compared to 07/03/2021 there are some new healing rib fractures including the right anterior fifth and sixth ribs as well as the left anterior fourth rib. This is in addition to multiple other healing fractures. 4. Asymmetric hypodensity in the parenchyma of the left kidney upper pole compared to the right. This could be due to the very early phase of contrast causing some  asymmetry, but pyelonephritis could cause a similar appearance. Correlate with urine analysis. 5. Nonspecific 1.3 cm ill-defined hypodensity centrally in the lateral segment left hepatic lobe. This could be further assessed with dedicated hepatic imaging if clinically warranted. 6. Mild cardiomegaly.  Aortic Atherosclerosis (ICD10-I70.0). Electronically Signed   By: Van Clines M.D.   On: 09/01/2021 14:32   CT ABDOMEN PELVIS W CONTRAST  Result Date: 09/02/2021 CLINICAL DATA:  Acute abdominal pain. EXAM: CT ABDOMEN AND PELVIS WITH CONTRAST TECHNIQUE: Multidetector CT imaging of the abdomen and pelvis was performed using the standard protocol following bolus administration of intravenous contrast. RADIATION DOSE REDUCTION: This exam was performed according to the departmental dose-optimization program which includes automated exposure  control, adjustment of the mA and/or kV according to patient size and/or use of iterative reconstruction technique. CONTRAST:  168mL OMNIPAQUE IOHEXOL 300 MG/ML  SOLN COMPARISON:  CT angiogram chest 09/01/2021, CT abdomen and pelvis 08/22/2021. FINDINGS: Lower chest: There is atelectasis in the lung bases. Hepatobiliary: There is focal fatty infiltration along the falciform ligament. Gallbladder and bile ducts are within normal limits. Pancreas: Unremarkable. No pancreatic ductal dilatation or surrounding inflammatory changes. Spleen: Normal in size without focal abnormality. Adrenals/Urinary Tract: There are ill-defined patchy areas of hypodensity with surrounding cortical enhancement in the bilateral kidneys, left greater than right. These are new from the prior examination. Small bilateral renal cysts are unchanged. There is no hydronephrosis or perinephric fluid collection. There is some thickening of the left renal pelvis. The adrenal glands and bladder are within normal limits. Contrast fills the bladder. Stomach/Bowel: Stomach is within normal limits. Appendix appears  normal. No evidence of bowel wall thickening, distention, or inflammatory changes. There is sigmoid colon diverticulosis without evidence for acute diverticulitis. Vascular/Lymphatic: Aortic atherosclerosis. No enlarged abdominal or pelvic lymph nodes. Reproductive: Uterus and bilateral adnexa are unremarkable. Other: There is a small fat containing umbilical hernia. No ascites. Musculoskeletal: Degenerative changes affect the spine. IMPRESSION: 1. Findings worrisome for bilateral pyelonephritis, left greater than right. This appears severe on the left. No perinephric fluid collection or hydronephrosis. 2.  Sigmoid colon diverticulosis. 3.  Aortic Atherosclerosis (ICD10-I70.0). Electronically Signed   By: Ronney Asters M.D.   On: 09/02/2021 00:27   DG Chest Port 1 View  Result Date: 09/01/2021 CLINICAL DATA:  Question sepsis EXAM: PORTABLE CHEST 1 VIEW COMPARISON:  07/27/2021 FINDINGS: Previously seen tracheostomy tube is no longer present. Heart size is upper limits of normal. Chronic aortic atherosclerosis. Minimal residual atelectasis at the lung bases, improved. No worsening or new finding. No effusion. IMPRESSION: Improved chest radiography.  Minimal residual basilar atelectasis. Electronically Signed   By: Nelson Chimes M.D.   On: 09/01/2021 12:37   DG Hip Unilat W or Wo Pelvis 2-3 Views Left  Result Date: 09/01/2021 CLINICAL DATA:  Question sepsis.  Pain. EXAM: DG HIP (WITH OR WITHOUT PELVIS) 2-3V LEFT COMPARISON:  CT abdomen 06/22/2021 FINDINGS: Healing bilateral rami fractures. Healing right sacral fractures. No hip fracture. Other bones of the pelvis appear normal. IMPRESSION: No acute finding. Healing fractures of the right sacrum and bilateral pubic rami. Electronically Signed   By: Nelson Chimes M.D.   On: 09/01/2021 12:40    Time Spent in minutes  30 minutes  Pt is currently observation, but due to slow to resolved hypokalemia along with increasing leukocytosis, might need to change to  inpatient status.    Jani Gravel M.D on 09/02/2021 at 7:57 AM  Between 7am to 7pm - Pager - [phone]  After 7pm go to [email or web address] - password [facility]  [facility] -  Office  [phone]

## 2021-09-02 NOTE — TOC Initial Note (Signed)
Transition of Care [facility]) - Initial/Assessment Note    Patient Details  Name: Gina Bradley MRN: [id number] Date of Birth: 11-02-1960  [facility] CM/SW Contact:    Iona Beard, [place] Phone Number: 09/02/2021, 10:56 AM  Clinical Narrative:                 TOC updated that PT is recommending SNF for pt at D/C. CSW spoke with pt in room at bedside about PT recommendation. Pt states she is agreeable to referral being sent out. Pt states she has been to [facility]. She prefers to go to [facility] if possible. CSW explained that referral can be sent to all facilities and [facility] will follow up with all bed offers. PT agreeable to referral being sent to local facilities. TOC to follow.   Expected Discharge Plan: Skilled Nursing Facility Barriers to Discharge: Continued Medical Work up   Patient Goals and CMS Choice Patient states their goals for this hospitalization and ongoing recovery are:: go to SNF CMS Medicare.gov Compare Post Acute Care list provided to:: Patient Choice offered to / list presented to : Patient  Expected Discharge Plan and Services Expected Discharge Plan: [place] In-house Referral: Clinical Social Work   Post Acute Care Choice: [place] Living arrangements for the past 2 months: [place]                                      Prior Living Arrangements/Services Living arrangements for the past 2 months: Single Family Home Lives with:: Spouse Patient language and need for interpreter reviewed:: Yes Do you feel safe going back to the place where you live?: Yes      Need for Family Participation in Patient Care: Yes (Comment) Care giver support system in place?: Yes (comment)   Criminal Activity/Legal Involvement Pertinent to Current Situation/Hospitalization: No - Comment as needed  Activities of Daily Living Home Assistive Devices/Equipment: None ADL Screening (condition at time of  admission) Patient's cognitive ability adequate to safely complete daily activities?: No Is the patient deaf or have difficulty hearing?: No Does the patient have difficulty seeing, even when wearing glasses/contacts?: No Does the patient have difficulty concentrating, remembering, or making decisions?: No Patient able to express need for assistance with ADLs?: Yes Does the patient have difficulty dressing or bathing?: Yes Independently performs ADLs?: Yes (appropriate for developmental age) Does the patient have difficulty walking or climbing stairs?: Yes Weakness of Legs: Both Weakness of Arms/Hands: Both  Permission Sought/Granted                  Emotional Assessment Appearance:: Appears stated age Attitude/Demeanor/Rapport: Engaged Affect (typically observed): Accepting Orientation: : Oriented to Self, Oriented to Place, Oriented to  Time, Oriented to Situation Alcohol / Substance Use: Not Applicable Psych Involvement: No (comment)  Admission diagnosis:  Hypokalemia [E87.6] Patient Active Problem List   Diagnosis Date Noted   Hyponatremia 09/01/2021   Hypomagnesemia 09/01/2021   Normocytic anemia 09/01/2021   Sepsis secondary to UTI ([place]) 09/01/2021   Rib fractures 09/01/2021   Sepsis ([place])    Acute metabolic encephalopathy [id number]   Opioid dependence ([place]) 06/23/2021   Multifocal pneumonia 06/23/2021   Obesity, Class III, BMI 40-49.9 (morbid obesity) ([place]) 06/23/2021   Transaminasemia 06/23/2021   Acute respiratory failure with hypoxia and hypercarbia ([place]) 06/22/2021   Severe sepsis ([place]) 06/22/2021   Right  subacute Inferior pubic ramus fracture 06/22/2021   Endometrial thickening on CT scan 06/22/2021   Chronic heart failure with preserved ejection fraction (HFpEF) ([place]) 06/22/2021   Uncontrolled type 2 diabetes mellitus with hyperglycemia, without long-term current use of insulin ([place]) 08/18/2020   Tick bite of left lower leg    Peripheral edema 08/17/2020    Hypokalemia    Class 1 obesity due to excess calories with body mass index (BMI) of 33.0 to 33.9 in adult    Cellulitis    Acidosis, metabolic [id number]   PMB (postmenopausal bleeding) 10/06/2016   Chest pressure 01/05/2015   Gastric erosion    Encounter for screening colonoscopy 01/22/2014   Constipation 01/20/2014   GERD (gastroesophageal reflux disease) 01/20/2014   Dyspepsia 01/20/2014   PSVT (paroxysmal supraventricular tachycardia) ([place]) 05/31/2013   HTN (hypertension), benign 05/31/2013   Hypercholesteremia 05/31/2013   PCP:  Redmond School, MD Pharmacy:   [place], [place] [id number] [place] [id number] Phone: [phone] Fax: [phone]     Social Determinants of Health (SDOH) Interventions    Readmission Risk Interventions    07/30/2021    5:17 PM  Readmission Risk Prevention Plan  Transportation Screening Complete  Medication Review (RN Care Manager) Complete  PCP or Specialist appointment within 3-5 days of discharge Complete  HRI or [place] Complete  SW Recovery Care/Counseling Consult Complete  Palliative Care Screening Complete  Skilled Nursing Facility Complete

## 2021-09-03 LAB — CBC
HCT: 30.7 % — ABNORMAL LOW (ref 36.0–46.0)
Hemoglobin: 9.5 g/dL — ABNORMAL LOW (ref 12.0–15.0)
MCH: 27.3 pg (ref 26.0–34.0)
MCHC: 30.9 g/dL (ref 30.0–36.0)
MCV: 88.2 fL (ref 80.0–100.0)
Platelets: 329 10*3/uL (ref 150–400)
RBC: 3.48 MIL/uL — ABNORMAL LOW (ref 3.87–5.11)
RDW: 15.9 % — ABNORMAL HIGH (ref 11.5–15.5)
WBC: 12.8 10*3/uL — ABNORMAL HIGH (ref 4.0–10.5)
nRBC: 0 % (ref 0.0–0.2)

## 2021-09-03 LAB — GLUCOSE, CAPILLARY
Glucose-Capillary: 104 mg/dL — ABNORMAL HIGH (ref 70–99)
Glucose-Capillary: 175 mg/dL — ABNORMAL HIGH (ref 70–99)
Glucose-Capillary: 149 mg/dL — ABNORMAL HIGH (ref 70–99)
Glucose-Capillary: 165 mg/dL — ABNORMAL HIGH (ref 70–99)

## 2021-09-03 LAB — URINE CULTURE: Culture: >=100000 — AB

## 2021-09-03 LAB — RENAL FUNCTION PANEL
Albumin: 2.2 g/dL — ABNORMAL LOW (ref 3.5–5.0)
Anion gap: 9 (ref 5–15)
BUN: 10 mg/dL (ref 6–20)
CO2: 26 mmol/L (ref 22–32)
Calcium: 7.9 mg/dL — ABNORMAL LOW (ref 8.9–10.3)
Chloride: 95 mmol/L — ABNORMAL LOW (ref 98–111)
Creatinine, Ser: 0.89 mg/dL (ref 0.44–1.00)
GFR, Estimated: >60 mL/min (ref >60)
Glucose, Bld: 98 mg/dL (ref 70–99)
Phosphorus: 2.4 mg/dL — ABNORMAL LOW (ref 2.5–4.6)
Potassium: 3.6 mmol/L (ref 3.5–5.1)
Sodium: 130 mmol/L — ABNORMAL LOW (ref 135–145)

## 2021-09-03 LAB — COMPREHENSIVE METABOLIC PANEL
ALT: 13 U/L (ref 0–44)
AST: 14 U/L — ABNORMAL LOW (ref 15–41)
Albumin: 2.2 g/dL — ABNORMAL LOW (ref 3.5–5.0)
Alkaline Phosphatase: 74 U/L (ref 38–126)
Anion gap: 9 (ref 5–15)
BUN: 10 mg/dL (ref 6–20)
CO2: 25 mmol/L (ref 22–32)
Calcium: 7.8 mg/dL — ABNORMAL LOW (ref 8.9–10.3)
Chloride: 95 mmol/L — ABNORMAL LOW (ref 98–111)
Creatinine, Ser: 0.81 mg/dL (ref 0.44–1.00)
GFR, Estimated: >60 mL/min (ref >60)
Glucose, Bld: 100 mg/dL — ABNORMAL HIGH (ref 70–99)
Potassium: 3.5 mmol/L (ref 3.5–5.1)
Sodium: 129 mmol/L — ABNORMAL LOW (ref 135–145)
Total Bilirubin: 0.6 mg/dL (ref 0.3–1.2)
Total Protein: 5.6 g/dL — ABNORMAL LOW (ref 6.5–8.1)

## 2021-09-03 MED ORDER — K PHOS MONO-SOD PHOS DI & MONO 155-852-130 MG PO TABS
250.0000 mg | ORAL_TABLET | Freq: Three times a day (TID) | ORAL | Status: DC
Start: 2021-09-03 — End: 2021-09-04
  Administered 2021-09-03 – 2021-09-04 (×4): 250 mg via ORAL
  Filled 2021-09-03 (×5): qty 1

## 2021-09-03 MED ORDER — METOPROLOL TARTRATE 25 MG PO TABS
12.5000 mg | ORAL_TABLET | Freq: Two times a day (BID) | ORAL | Status: DC
  Administered 2021-09-03 – 2021-09-04 (×2): 12.5 mg via ORAL
  Filled 2021-09-03 (×2): qty 1

## 2021-09-03 NOTE — Progress Notes (Signed)
PROGRESS NOTE                                                                                                                                                                                                             Patient Demographics:    Gina Bradley, is a 61 y.o. female, DOB - 05-30-1960, VAC:[id number]  Admit date - 09/01/2021   Admitting Physician Markies Mowatt Denton Brick, MD  Outpatient Primary MD for the patient is Redmond School, MD  LOS - 1  Outpatient Specialists:  Chief Complaint  Patient presents with   Weakness       Brief Narrative  61 y.o. female w Hypertension, Dm2, HFpEF, steroid dependent Copd, opiate dependence L4 compression fracture/ pubic rami fracture 06/06/21, SVT 06/22/21, septic shock from pneumonia 06/22/21 s/p intubation discharged to [facility] 08/04/21, who apparently went home from SNF 3 days ago and c/o generalized weakness.  Pt typically walks with walker at home.  Pt takes lasix at home and is not on any potassium supplementation.  pt denies any change to lasix dose.   In ED, pt noted to have potassium of 2.2, and also noted to have UTI   Subjective:    Gina Bradley today denies vomiting or diarrhea -Fatigue and generalized weakness persist -Patient is no longer sure she wants to go to SNF rehab -Oral intake is not great    Assessment  & Plan :    Principal Problem:   Hypokalemia Active Problems:   Hyponatremia   Hypomagnesemia   Normocytic anemia   Sepsis secondary to UTI ([place])   Rib fractures   Hyponatremia/hypochloremia--- improving slowly with IV fluids -Sodium is up to 129  Hypokalemia/hypomagnesemia Replaced and rechecked   E. coli sepsis secondary to urinary source -POA -Met sepsis criteria on admission  -Leukocytosis improving  -c/n Iv Rocephin  Hypotension-- soft BP, hold metoprolol   Chronic anemia  Hgb close to recent baseline -Admission Hgb was higher than baseline  due to dehydration/hemoconcentration   Rib Fractures, pubic rami fractures, L4 compression fracture Cont Gabapentin 358m po q8h Cont oxycodone 118mpo qid prn  Will need outpatient bone density test and antiresorptive agent   Copd /OSA on home o2 Cont prednisone 13m58mo qday Cont Anoro Albuterol neb q6h prn    HFpEF/CAD Cont Lasix 69m2m qday Cont Aspirin Cont metoprolol 213mg213m  bid   DM2 Use Novolog/Humalog Sliding scale insulin with Accu-Cheks/Fingersticks as ordered    RLS Cont Primidone   Anxiety/ Depression/ Insomnia Increase Duloxetine 20->59m po qday (6/1) Cont Clonazepam 0.257mpo bid  Cont Trazodone 5048mo qhs prn    Gerd Cont Protonix  Severe protein Calorie malnutrition Nutrition consult C/n ensure liquid  Tinea Start Ketoconazole cream bid   Disposition--patient and husband thinking about going home with home health services rather than going to an SNF rehab -Possible discharge home on 09/04/2021 if electrolytes improve and oral intake improves   Code Status : Full Code  Family Communication  :   called RicAudry [phone] answer  Disposition Plan  :  ? SNF vs home  Barriers For Discharge :  deconditioning  Consults  :  PT to evaluate and tx, consider OT depending upon Transition consult recommendations, Nutrition consult  Procedures  :  CTA chest 5/31 Xray pelvis/hip 5/31  DVT Prophylaxis  :    Lab Results  Component Value Date   PLT 329 09/03/2021    Antibiotics  :  Rocephin 5/31->  Anti-infectives (From admission, onward)    Start     Dose/Rate Route Frequency Ordered Stop   09/02/21 1100  cefTRIAXone (ROCEPHIN) 1 g in sodium chloride 0.9 % 100 mL IVPB        1 g 200 mL/hr over 30 Minutes Intravenous Every 24 hours 09/01/21 1620 09/11/21 1059   09/01/21 1445  cefTRIAXone (ROCEPHIN) 2 g in sodium chloride 0.9 % 100 mL IVPB        2 g 200 mL/hr over 30 Minutes Intravenous  Once 09/01/21 1437 09/01/21 1530          Objective:   Vitals:   09/02/21 2140 09/03/21 0451 09/03/21 0835 09/03/21 1230  BP: (!) 100/53 120/71  (!) 83/47  Pulse: 89 (!) 106  82  Resp: 20 19  20   Temp: 98.2 F (36.8 C) 99.9 F (37.7 C)  98.7 F (37.1 C)  TempSrc: Oral   Oral  SpO2: 91% 93% 93% 95%  Weight:      Height:        Wt Readings from Last 3 Encounters:  09/01/21 91.6 kg  08/03/21 95.4 kg  06/06/21 99.8 kg     Intake/Output Summary (Last 24 hours) at 09/03/2021 1812 Last data filed at 09/03/2021 1354 Gross per 24 hour  Intake 960 ml  Output 1201 ml  Net -241 ml    Physical Exam  Gen:- Awake Alert, in no acute distress  HEENT:- [place].AT, No sclera icterus Neck-Supple Neck,No JVD,.  Lungs-  CTAB , fair air movement bilaterally  CV- S1, S2 normal, RRR Abd-  +ve B.Sounds, Abd Soft, No tenderness, increased truncal adiposity Extremity/Skin:-   good pedal pulses , chronic venous stasis Psych-affect is appropriate, oriented x3 Neuro-generalized weakness, no new focal deficits, no tremors     Data Review:    CBC Recent Labs  Lab 09/01/21 1144 09/02/21 0535 09/03/21 0536  WBC 15.6* 18.1* 12.8*  HGB 11.3* 9.7* 9.5*  HCT 34.9* 30.6* 30.7*  PLT 303 334 329  MCV 85.3 87.4 88.2  MCH 27.6 27.7 27.3  MCHC 32.4 31.7 30.9  RDW 15.2 15.4 15.9*  LYMPHSABS 0.9  --   --   MONOABS 0.8  --   --   EOSABS 0.0  --   --   BASOSABS 0.0  --   --     Chemistries  Recent Labs  Lab 09/01/21  1144 09/02/21 0535 09/02/21 1320 09/03/21 0536  NA 128* 128* 129* 130*  129*  K 2.2* 2.5* 3.7 3.6  3.5  CL 84* 92* 94* 95*  95*  CO2 32 26 28 26  25   GLUCOSE 193* 113* 127* 98  100*  BUN 10 9 9 10  10   CREATININE 0.63 0.67 0.72 0.89  0.81  CALCIUM 8.1* 7.7* 8.0* 7.9*  7.8*  MG 1.6* 1.9  --   --   AST 19 17  --  14*  ALT 17 15  --  13  ALKPHOS 96 77  --  74  BILITOT 1.1 0.6  --  0.6   ------------------------------------------------------------------------------------------------------------------ No  results for input(s): CHOL, HDL, LDLCALC, TRIG, CHOLHDL, LDLDIRECT in the last 72 hours.  Lab Results  Component Value Date   HGBA1C 7.5 (H) 06/23/2021   ------------------------------------------------------------------------------------------------------------------ No results for input(s): TSH, T4TOTAL, T3FREE, THYROIDAB in the last 72 hours.  Invalid input(s): FREET3 ------------------------------------------------------------------------------------------------------------------ No results for input(s): VITAMINB12, FOLATE, FERRITIN, TIBC, IRON, RETICCTPCT in the last 72 hours.  Coagulation profile Recent Labs  Lab 09/01/21 1144  INR 1.0    Recent Labs    09/01/21 1144  DDIMER >20.00*    Cardiac Enzymes No results for input(s): CKMB, TROPONINI, MYOGLOBIN in the last 168 hours.  Invalid input(s): CK ------------------------------------------------------------------------------------------------------------------    Component Value Date/Time   BNP 23.2 07/31/2021 0103    Inpatient Medications  Scheduled Meds:  aspirin EC  81 mg Oral Daily   DULoxetine  40 mg Oral Daily   feeding supplement  237 mL Oral BID BM   furosemide  20 mg Oral Daily   gabapentin  300 mg Oral Q8H   heparin  5,000 Units Subcutaneous Q8H   insulin aspart  0-5 Units Subcutaneous QHS   insulin aspart  0-9 Units Subcutaneous TID WC   ketoconazole  1 application. Topical BID   magnesium oxide  400 mg Oral Daily   metoprolol tartrate  25 mg Oral BID   pantoprazole  40 mg Oral Daily   phosphorus  250 mg Oral TID   potassium chloride  40 mEq Oral Once   primidone  50 mg Oral QHS   sodium chloride flush  3 mL Intravenous Q12H   umeclidinium-vilanterol  1 puff Inhalation Daily   Continuous Infusions:  sodium chloride     cefTRIAXone (ROCEPHIN)  IV 1 g (09/03/21 1007)   PRN Meds:.sodium chloride, acetaminophen **OR** acetaminophen, albuterol, bisacodyl, clonazePAM, ondansetron **OR**  ondansetron (ZOFRAN) IV, oxyCODONE, polyethylene glycol, sodium chloride flush, traZODone  Micro Results Recent Results (from the past 240 hour(s))  Urine Culture     Status: Abnormal   Collection Time: 09/01/21 11:08 AM   Specimen: Urine, Clean Catch  Result Value Ref Range Status   Specimen Description   Final    URINE, CLEAN CATCH Performed at [facility], [address], [place], [place] [address]    Special Requests   Final    NONE Performed at [facility], [address] [place], [place] [address]    Culture >=100,000 COLONIES/mL ESCHERICHIA COLI (A)  Final   Report Status 09/03/2021 FINAL  Final   Organism ID, Bacteria ESCHERICHIA COLI (A)  Final      Susceptibility   Escherichia coli - MIC*    AMPICILLIN <=2 SENSITIVE Sensitive     CEFAZOLIN <=4 SENSITIVE Sensitive     CEFEPIME <=0.12 SENSITIVE Sensitive     CEFTRIAXONE <=0.25 SENSITIVE Sensitive     CIPROFLOXACIN <=0.25  SENSITIVE Sensitive     GENTAMICIN <=1 SENSITIVE Sensitive     IMIPENEM <=0.25 SENSITIVE Sensitive     NITROFURANTOIN <=16 SENSITIVE Sensitive     TRIMETH/SULFA <=20 SENSITIVE Sensitive     AMPICILLIN/SULBACTAM <=2 SENSITIVE Sensitive     PIP/TAZO <=4 SENSITIVE Sensitive     * >=100,000 COLONIES/mL ESCHERICHIA COLI  Culture, blood (Routine X 2) w Reflex to ID Panel     Status: None (Preliminary result)   Collection Time: 09/01/21  2:46 PM   Specimen: BLOOD  Result Value Ref Range Status   Specimen Description BLOOD BLOOD RIGHT HAND  Final   Special Requests   Final    BOTTLES DRAWN AEROBIC ONLY Blood Culture results may not be optimal due to an inadequate volume of blood received in culture bottles   Culture   Final    NO GROWTH < 24 HOURS Performed at [facility], [address], [place], [place] [address]    Report Status PENDING  Incomplete  Culture, blood (Routine X 2) w Reflex to ID Panel     Status: None (Preliminary result)   Collection Time: 09/01/21  2:51 PM   Specimen: BLOOD  Result  Value Ref Range Status   Specimen Description BLOOD BLOOD LEFT HAND  Final   Special Requests   Final    BOTTLES DRAWN AEROBIC ONLY Blood Culture adequate volume   Culture   Final    NO GROWTH < 24 HOURS Performed at [facility], [address], [place], [place] [address]    Report Status PENDING  Incomplete    Radiology Reports CT Angio Chest PE W and/or Wo Contrast  Result Date: 09/01/2021 CLINICAL DATA:  Elevated D-dimer level. Patient found on floor, stool incontinence EXAM: CT ANGIOGRAPHY CHEST WITH CONTRAST TECHNIQUE: Multidetector CT imaging of the chest was performed using the standard protocol during bolus administration of intravenous contrast. Multiplanar CT image reconstructions and MIPs were obtained to evaluate the vascular anatomy. RADIATION DOSE REDUCTION: This exam was performed according to the departmental dose-optimization program which includes automated exposure control, adjustment of the mA and/or kV according to patient size and/or use of iterative reconstruction technique. CONTRAST:  98m OMNIPAQUE IOHEXOL 350 MG/ML SOLN COMPARISON:  09/01/2021 and 07/03/2021 FINDINGS: Cardiovascular: No filling defect is identified in the pulmonary arterial tree to suggest pulmonary embolus. Mild aortic atherosclerotic vascular calcification. Mild cardiomegaly. Mediastinum/Nodes: Unremarkable Lungs/Pleura: Ground-glass and linear opacities in the perihilar regions with the airspace opacity substantially improved compared to 07/03/2021. Much of the nodular component of airspace opacity shown on prior exam has resolved. No pleural effusion. Upper Abdomen: Nonspecific questionable 1.3 cm ill-defined hypodensity centrally in the lateral segment left hepatic lobe on image 89 series 4. The upper pole the left kidney is substantially more hypodense than the upper pole the right kidney. I am uncertain whether this is related to the very early phase of contrast or if there is an inflammatory process  such as pyelonephritis, correlate with urine analysis. Musculoskeletal: Numerous bilateral rib fractures in various states of healing. There are some new fractures visible compared to 07/03/2021 including the right anterior fifth and sixth ribs as well as the left anterior fourth rib. Review of the MIP images confirms the above findings. IMPRESSION: 1. No filling defect is identified in the pulmonary arterial tree to suggest pulmonary embolus. 2. Substantial improvement in bilateral airspace opacities compared to 07/03/2021. 3. Numerous healing bilateral rib fractures. Compared to 07/03/2021 there are some new healing rib fractures including the right  anterior fifth and sixth ribs as well as the left anterior fourth rib. This is in addition to multiple other healing fractures. 4. Asymmetric hypodensity in the parenchyma of the left kidney upper pole compared to the right. This could be due to the very early phase of contrast causing some asymmetry, but pyelonephritis could cause a similar appearance. Correlate with urine analysis. 5. Nonspecific 1.3 cm ill-defined hypodensity centrally in the lateral segment left hepatic lobe. This could be further assessed with dedicated hepatic imaging if clinically warranted. 6. Mild cardiomegaly.  Aortic Atherosclerosis (ICD10-I70.0). Electronically Signed   By: Van Clines M.D.   On: 09/01/2021 14:32   CT ABDOMEN PELVIS W CONTRAST  Result Date: 09/02/2021 CLINICAL DATA:  Acute abdominal pain. EXAM: CT ABDOMEN AND PELVIS WITH CONTRAST TECHNIQUE: Multidetector CT imaging of the abdomen and pelvis was performed using the standard protocol following bolus administration of intravenous contrast. RADIATION DOSE REDUCTION: This exam was performed according to the departmental dose-optimization program which includes automated exposure control, adjustment of the mA and/or kV according to patient size and/or use of iterative reconstruction technique. CONTRAST:  186m OMNIPAQUE  IOHEXOL 300 MG/ML  SOLN COMPARISON:  CT angiogram chest 09/01/2021, CT abdomen and pelvis 08/22/2021. FINDINGS: Lower chest: There is atelectasis in the lung bases. Hepatobiliary: There is focal fatty infiltration along the falciform ligament. Gallbladder and bile ducts are within normal limits. Pancreas: Unremarkable. No pancreatic ductal dilatation or surrounding inflammatory changes. Spleen: Normal in size without focal abnormality. Adrenals/Urinary Tract: There are ill-defined patchy areas of hypodensity with surrounding cortical enhancement in the bilateral kidneys, left greater than right. These are new from the prior examination. Small bilateral renal cysts are unchanged. There is no hydronephrosis or perinephric fluid collection. There is some thickening of the left renal pelvis. The adrenal glands and bladder are within normal limits. Contrast fills the bladder. Stomach/Bowel: Stomach is within normal limits. Appendix appears normal. No evidence of bowel wall thickening, distention, or inflammatory changes. There is sigmoid colon diverticulosis without evidence for acute diverticulitis. Vascular/Lymphatic: Aortic atherosclerosis. No enlarged abdominal or pelvic lymph nodes. Reproductive: Uterus and bilateral adnexa are unremarkable. Other: There is a small fat containing umbilical hernia. No ascites. Musculoskeletal: Degenerative changes affect the spine. IMPRESSION: 1. Findings worrisome for bilateral pyelonephritis, left greater than right. This appears severe on the left. No perinephric fluid collection or hydronephrosis. 2.  Sigmoid colon diverticulosis. 3.  Aortic Atherosclerosis (ICD10-I70.0). Electronically Signed   By: ARonney AstersM.D.   On: 09/02/2021 00:27   DG Chest Port 1 View  Result Date: 09/01/2021 CLINICAL DATA:  Question sepsis EXAM: PORTABLE CHEST 1 VIEW COMPARISON:  07/27/2021 FINDINGS: Previously seen tracheostomy tube is no longer present. Heart size is upper limits of normal.  Chronic aortic atherosclerosis. Minimal residual atelectasis at the lung bases, improved. No worsening or new finding. No effusion. IMPRESSION: Improved chest radiography.  Minimal residual basilar atelectasis. Electronically Signed   By: MNelson ChimesM.D.   On: 09/01/2021 12:37   DG Hip Unilat W or Wo Pelvis 2-3 Views Left  Result Date: 09/01/2021 CLINICAL DATA:  Question sepsis.  Pain. EXAM: DG HIP (WITH OR WITHOUT PELVIS) 2-3V LEFT COMPARISON:  CT abdomen 06/22/2021 FINDINGS: Healing bilateral rami fractures. Healing right sacral fractures. No hip fracture. Other bones of the pelvis appear normal. IMPRESSION: No acute finding. Healing fractures of the right sacrum and bilateral pubic rami. Electronically Signed   By: MNelson ChimesM.D.   On: 09/01/2021 12:40     Etienne Mowers  Charlesia Canaday M.D on 09/03/2021 at 6:12 PM  Between 7am to 7pm - Pager - [phone]  After 7pm go to [email or web address] - password [facility]  [facility] -  Office  [phone]

## 2021-09-03 NOTE — TOC Progression Note (Signed)
Transition of Care [facility]) - Progression Note    Patient Details  Name: Gina Bradley MRN: [id number] Date of Birth: Nov 30, 1960  Transition of Care [facility]) CM/SW Contact  Shade Flood, LCSW Phone Number: 09/03/2021, 11:52 AM  Clinical Narrative:     TOC following. Met with pt at bedside today to review dc planning. Pt reports that she is feeling much better and strong enough to go home with HH at dc. This LCSW and pt called pt's husband on speaker phone to update. He is in agreement with this plan. TOC attempting to determine if pt was referred to a [facility] agency when she left rehab last week. Will update HH or arrange [place] depending on what is discovered.  TOC will follow.  Expected Discharge Plan: [place] Barriers to Discharge: Continued Medical Work up  Expected Discharge Plan and Services Expected Discharge Plan: [place] In-house Referral: Clinical Social Work   Post Acute Care Choice: [place] Living arrangements for the past 2 months: [place] Determinants of Health (SDOH) Interventions    Readmission Risk Interventions    07/30/2021    5:17 PM  Readmission Risk Prevention Plan  Transportation Screening Complete  Medication Review (RN Care Manager) Complete  PCP or Specialist appointment within 3-5 days of discharge Complete  HRI or [place] Complete  SW Recovery Care/Counseling Consult Complete  Palliative Care Screening Complete  Skilled Nursing Facility Complete

## 2021-09-03 NOTE — Progress Notes (Signed)
Initial Nutrition Assessment  DOCUMENTATION CODES:   Obesity unspecified  INTERVENTION:  Ensure Enlive po BID   Heart Healthy diet  NUTRITION DIAGNOSIS:   Inadequate oral intake related to poor appetite as evidenced by per patient/family report.   GOAL:  Patient will meet greater than or equal to 90% of their needs   MONITOR:  PO intake, Weight trends, Supplement acceptance, Labs  REASON FOR ASSESSMENT:   Consult Malnutrition Eval  ASSESSMENT: Patient is a 61 yo obese female. History of COPD, DM2, GERD, HF and anemia. Recent discharge from SNF who presents with complaint of weakness.   Patient eating 50-100% at this time. Feeds herself. Patient is up in chair eating lunch during RD visit. She is feeling much better and says her appetite has returned. Relays that her appetite has been off for several months and she has not been compensating with ONS but is amiable to receiving for nutrition support.   Expect insufficient intake per diet recall and acute weight loss. Patient weight is down 4% (3.8 kg) this month. At risk for malnutrition. Recommend pt continue ONS for at least 1 month after discharge.      Latest Ref Rng & Units 09/03/2021    5:36 AM 09/02/2021    1:20 PM 09/02/2021    5:35 AM  BMP  Glucose 70 - 99 mg/dL 354     98   562   563    BUN 6 - 20 mg/dL 10     10   9   9     Creatinine 0.44 - 1.00 mg/dL     8.93   7.34   0.67    Sodium 135 - 145 mmol/L 129     130   129   128    Potassium 3.5 - 5.1 mmol/L 3.5     3.6   3.7   2.5    Chloride 98 - 111 mmol/L 95     95   94   92    CO2 22 - 32 mmol/L 25     26   28   26     Calcium 8.9 - 10.3 mg/dL 7.8     7.9   8.0   7.7        NUTRITION - FOCUSED PHYSICAL EXAM: Nutrition-Focused physical exam completed. Findings are no fat depletion, mild temporal, anterior thigh muscle depletion, and mild edema.     Diet Order:   Diet Order             Diet Heart Room service appropriate? Yes; Fluid consistency:  Thin  Diet effective now                   EDUCATION NEEDS:  Education needs have been addressed  Skin:  Skin Assessment: Reviewed RN Assessment  Last BM:  5/31  Height:   Ht Readings from Last 1 Encounters:  09/01/21 5\' 1"  (1.549 m)    Weight:   Wt Readings from Last 1 Encounters:  09/01/21 91.6 kg    Ideal Body Weight:   48 kg  BMI:  Body mass index is 38.16 kg/m.  Estimated Nutritional Needs:   Kcal:  1700-1800  Protein:  80-85 gr  Fluid:  1700 ml daily   09/03/21 MS,RD,CSG,LDN Contact: 

## 2021-09-03 NOTE — TOC Progression Note (Signed)
Transition of Care [facility]) - Progression Note    Patient Details  Name: Gina Bradley MRN: [id number] Date of Birth: Apr 24, 1960  [facility] CM/SW Contact  Villa Herb, [place] Phone Number: 09/03/2021, 12:06 PM  Clinical Narrative:    CSW spoke to [place] with [facility] who states pt was set up with [facility] at DC from their facility. CSW spoke to [facility] rep who confirms they are active with pt for [facility] PT/OT/RN services. TOC to follow.   Expected Discharge Plan: Home w Home Health Services Barriers to Discharge: Continued Medical Work up  Expected Discharge Plan and Services Expected Discharge Plan: Home w Home Health Services In-house Referral: Clinical Social Work   Post Acute Care Choice: Skilled Nursing Facility Living arrangements for the past 2 months: Single Family Home                                       Social Determinants of Health (SDOH) Interventions    Readmission Risk Interventions    07/30/2021    5:17 PM  Readmission Risk Prevention Plan  Transportation Screening Complete  Medication Review (RN Care Manager) Complete  PCP or Specialist appointment within 3-5 days of discharge Complete  HRI or Home Care Consult Complete  SW Recovery Care/Counseling Consult Complete  Palliative Care Screening Complete  Skilled Nursing Facility Complete

## 2021-09-03 NOTE — Discharge Instructions (Signed)
Nutrition Post Hospital Stay Proper nutrition can help your body recover from illness and injury.   Foods and beverages high in protein, vitamins, and minerals help rebuild muscle loss, promote healing, & reduce fall risk.   In addition to eating healthy foods, a nutrition shake is an easy, delicious way to get the nutrition you need during and after your hospital stay  It is recommended that you continue to drink 2 bottles per day of:  High protein nutrition supplement for at least 1 month (30 days) after your hospital stay   Tips for adding a nutrition shake into your routine: As allowed, drink one with vitamins or medications instead of water or juice Enjoy one as a tasty mid-morning or afternoon snack Drink cold or make a milkshake out of it Drink one instead of milk with cereal or snacks Use as a coffee creamer   Available at the following grocery stores and pharmacies:           * Karin Golden * [employer] * [employer]  * [employer]          * [employer] * [employer]  * Walgreens      * Target  * BJ's   * CVS  * [employer]   * [facility] [phone]            For COUPONS visit: [email or web address]/join or [email or web address]   Suggested Substitutions Ensure Plus = Boost Plus = Carnation Breakfast Essentials = Boost Compact Ensure Active Clear = Boost Breeze Glucerna Shake = Boost Glucose Control = Carnation Breakfast Essentials SUGAR FREE

## 2021-09-03 NOTE — Progress Notes (Signed)
Physical Therapy Treatment Patient Details Name: Gina Bradley MRN: [id number] DOB: [birth year] Today's Date: 09/03/2021   History of Present Illness Gina Bradley  is a 61 y.o. female w Hypertension, Dm2, HFpEF, steroid dependent Copd, opiate dependence L4 compression fracture/ pubic rami fracture 06/06/21, SVT 06/22/21, septic shock from pneumonia 06/22/21 s/p intubation discharged to SNF 08/04/21, who apparently went home from SNF 3 days ago and c/o weakness.  Pt denies fever, chills, cough, cp, palp, sob, diarrhea, dysuria, hematuria, flank pain, and notes increased weakness.  Unable to Pt takes lasix at home and is not on any potassium supplementation    PT Comments    Patient presents in chair with assistance of nursing staff with reporting consent for PT treatment. Patient is min guard with sit to stand transfer and use of RW. Patient slightly improved with stability and balance during transfer with decreased in labored movement observed during transfer. Patient was able to ambulate for 40 feet with RW and min guard assist provided by PT. Patient was able to ambulate further than initial evaluation but still limited by fatigue. Patient still has some difficulty with weight shifting during gait but demonstrates improved stability and balance during ambulation. Patient was able to tolerate pre-gait therapeutic exercises to address strength deficits. Patient was left in chair with nursing staff notified of mobility status. Patient will benefit from continued skilled physical therapy in hospital and recommended venue below to increase strength, balance, endurance for safe ADLs and gait.    Recommendations for follow up therapy are one component of a multi-disciplinary discharge planning process, led by the attending physician.  Recommendations may be updated based on patient status, additional functional criteria and insurance authorization.  Follow Up Recommendations  Skilled nursing-short term rehab  (<3 hours/day)     Assistance Recommended at Discharge Set up Supervision/Assistance  Patient can return home with the following Help with stairs or ramp for entrance;A little help with walking and/or transfers;A little help with bathing/dressing/bathroom   Equipment Recommendations  None recommended by PT    Recommendations for Other Services       Precautions / Restrictions Precautions Precautions: Fall Restrictions Weight Bearing Restrictions: No     Mobility  Bed Mobility               General bed mobility comments: Patient presented sitting in chair with assistance of nursing staff    Transfers Overall transfer level: Needs assistance Equipment used: Rolling walker (2 wheels) Transfers: Sit to/from Stand Sit to Stand: Min guard           General transfer comment: Patient improved sit to stand transfer with needing min guard assist and demonstrated better stability and balance with movement during transfer with use of RW.    Ambulation/Gait Ambulation/Gait assistance: Min guard Gait Distance (Feet): 40 Feet Assistive device: Rolling walker (2 wheels) Gait Pattern/deviations: Step-to pattern, Decreased step length - right, Decreased step length - left, Decreased stride length, Decreased weight shift to left, Decreased weight shift to right Gait velocity: decreased     General Gait Details: Patient was able to ambulate for 40 feet with min guard assist and RW utilized. Patient still has some difficulty weight shfiting but better stability and balance observed compared to intial evaluation. Patient was primarily limited by fatigue.   Stairs             Wheelchair Mobility    Modified Rankin (Stroke Patients Only)       Balance Overall balance assessment: Needs  assistance Sitting-balance support: Bilateral upper extremity supported, Feet supported Sitting balance-Leahy Scale: Fair Sitting balance - Comments: Patient able to sit EOB with UE  support   Standing balance support: Bilateral upper extremity supported, During functional activity, Reliant on assistive device for balance Standing balance-Leahy Scale: Poor Standing balance comment: Patient still reliant on RW with forward trunk lean but demonstrates minor improvement when reaching outside BOS and with dynamic balance.                            Cognition Arousal/Alertness: Awake/alert Behavior During Therapy: WFL for tasks assessed/performed Overall Cognitive Status: Within Functional Limits for tasks assessed                                          Exercises General Exercises - Lower Extremity Long Arc Quad: AROM, Strengthening, Both, 15 reps, Seated Hip Flexion/Marching: AROM, Strengthening, Both, 15 reps, Seated Toe Raises: AROM, 20 reps, Strengthening, Both, Seated Heel Raises: AROM, Strengthening, Both, 20 reps, Seated    General Comments        Pertinent Vitals/Pain Pain Assessment Pain Assessment: Faces Faces Pain Scale: Hurts a little bit Pain Location: L side rib pain Pain Descriptors / Indicators: Stabbing Pain Intervention(s): Limited activity within patient's tolerance, Monitored during session, Repositioned    Home Living                          Prior Function            PT Goals (current goals can now be found in the care plan section) Acute Rehab PT Goals Patient Stated Goal: return home PT Goal Formulation: With patient Time For Goal Achievement: 09/16/21 Potential to Achieve Goals: Fair Progress towards PT goals: Progressing toward goals    Frequency    Min 3X/week      PT Plan Current plan remains appropriate    Co-evaluation   Reason for Co-Treatment: To address functional/ADL transfers PT goals addressed during session: Mobility/safety with mobility;Balance;Proper use of DME;Strengthening/ROM        AM-PAC PT "6 Clicks" Mobility   Outcome Measure  Help needed turning  from your back to your side while in a flat bed without using bedrails?: None Help needed moving from lying on your back to sitting on the side of a flat bed without using bedrails?: A Little Help needed moving to and from a bed to a chair (including a wheelchair)?: A Little Help needed standing up from a chair using your arms (e.g., wheelchair or bedside chair)?: A Little Help needed to walk in hospital room?: A Little Help needed climbing 3-5 steps with a railing? : A Lot 6 Click Score: 18    End of Session Equipment Utilized During Treatment: Gait belt Activity Tolerance: Patient tolerated treatment well;No increased pain;Patient limited by fatigue Patient left: in chair;with call bell/phone within reach Nurse Communication: Mobility status PT Visit Diagnosis: Unsteadiness on feet (R26.81);Other abnormalities of gait and mobility (R26.89);Muscle weakness (generalized) (M62.81)     Time: 1610-9604 PT Time Calculation (min) (ACUTE ONLY): 14 min  Charges:  $Therapeutic Activity: 8-22 mins                     11:55 AM, 09/03/21 Arther Abbott, S/PT

## 2021-09-04 LAB — CBC
HCT: 30.7 % — ABNORMAL LOW (ref 36.0–46.0)
Hemoglobin: 9.5 g/dL — ABNORMAL LOW (ref 12.0–15.0)
MCH: 27.6 pg (ref 26.0–34.0)
MCHC: 30.9 g/dL (ref 30.0–36.0)
MCV: 89.2 fL (ref 80.0–100.0)
Platelets: 395 10*3/uL (ref 150–400)
RBC: 3.44 MIL/uL — ABNORMAL LOW (ref 3.87–5.11)
RDW: 16 % — ABNORMAL HIGH (ref 11.5–15.5)
WBC: 10.6 10*3/uL — ABNORMAL HIGH (ref 4.0–10.5)
nRBC: 0 % (ref 0.0–0.2)

## 2021-09-04 LAB — RENAL FUNCTION PANEL
Albumin: 2.3 g/dL — ABNORMAL LOW (ref 3.5–5.0)
Anion gap: 8 (ref 5–15)
BUN: 11 mg/dL (ref 6–20)
CO2: 28 mmol/L (ref 22–32)
Calcium: 8 mg/dL — ABNORMAL LOW (ref 8.9–10.3)
Chloride: 97 mmol/L — ABNORMAL LOW (ref 98–111)
Creatinine, Ser: 0.74 mg/dL (ref 0.44–1.00)
GFR, Estimated: >60 mL/min (ref >60)
Glucose, Bld: 135 mg/dL — ABNORMAL HIGH (ref 70–99)
Phosphorus: 5.1 mg/dL — ABNORMAL HIGH (ref 2.5–4.6)
Potassium: 3.5 mmol/L (ref 3.5–5.1)
Sodium: 133 mmol/L — ABNORMAL LOW (ref 135–145)

## 2021-09-04 LAB — GLUCOSE, CAPILLARY
Glucose-Capillary: 125 mg/dL — ABNORMAL HIGH (ref 70–99)
Glucose-Capillary: 152 mg/dL — ABNORMAL HIGH (ref 70–99)

## 2021-09-04 MED ORDER — FUROSEMIDE 20 MG PO TABS: 20.0000 mg | ORAL_TABLET | Freq: Every day | ORAL | 0 refills | Status: AC | PRN

## 2021-09-04 MED ORDER — ASPIRIN 81 MG PO TBEC: 81.0000 mg | DELAYED_RELEASE_TABLET | Freq: Every day | ORAL | 12 refills | Status: AC

## 2021-09-04 MED ORDER — POTASSIUM CHLORIDE CRYS ER 20 MEQ PO TBCR
40.0000 meq | EXTENDED_RELEASE_TABLET | Freq: Once | ORAL | Status: AC
  Administered 2021-09-04: 40 meq via ORAL
  Filled 2021-09-04: qty 2

## 2021-09-04 MED ORDER — CEPHALEXIN 500 MG PO CAPS
500.0000 mg | ORAL_CAPSULE | Freq: Three times a day (TID) | ORAL | Status: DC
  Administered 2021-09-04: 500 mg via ORAL
  Filled 2021-09-04: qty 1

## 2021-09-04 NOTE — Progress Notes (Signed)
Pt discharged home with brother in stable condition. Discharge instructions given. Script sent to pharmacy of choice. No immediate questions or concerns at this time. DC'd from unit iva wheelchair

## 2021-09-04 NOTE — Discharge Summary (Signed)
Gina Bradley, is a 61 y.o. female  DOB Aug 03, 1960  MRN [id number].  Admission date:  09/01/2021  Admitting Physician  Cree Napoli Denton Brick, MD  Discharge Date:  09/04/2021   Primary MD  Redmond School, MD  Recommendations for primary care physician for things to follow:   1)Avoid ibuprofen/Advil/Aleve/Motrin/Goody Powders/Naproxen/BC powders/Meloxicam/Diclofenac/Indomethacin and other Nonsteroidal anti-inflammatory medications as these will make you more likely to bleed and can cause stomach ulcers, can also cause Kidney problems.   2)Repeat CBC and BMP Blood Tests on Tuesday 09/07/21  Admission Diagnosis  Hypokalemia [E87.6]   Discharge Diagnosis  Hypokalemia [E87.6]    Principal Problem:   Hypokalemia Active Problems:   Hyponatremia   Hypomagnesemia   Normocytic anemia   Sepsis secondary to UTI [facility])   Rib fractures      Past Medical History:  Diagnosis Date   Anemia    Bilateral pubic rami fractures ([place]) 06/06/2021   Cervical disc disease    COPD (chronic obstructive pulmonary disease) (HCC)    on home o2   Diabetes mellitus without complication (HCC)    Essential hypertension    GERD (gastroesophageal reflux disease)    Heart failure ([place]) 2018   History of cardiac catheterization    Normal coronaries March 2015   History of non-ST elevation myocardial infarction (NSTEMI)    Secondary to SVT   History of vertebral fracture 06/06/2021   L4   Lymphedema    Peripheral neuropathy    Rib fracture    09/01/2021   Sacral fracture (HCC)    Sleep apnea    Spinal stenosis    SVT (supraventricular tachycardia) ([place])     Past Surgical History:  Procedure Laterality Date   CESAREAN SECTION     ESOPHAGOGASTRODUODENOSCOPY N/A 02/10/2014   OIN:OMVEHM tiny antral erosion; otherwise normal EGD. No explanation for patient's symptoms. Gallbladder needs further evaluation.   LEFT HEART  CATHETERIZATION WITH CORONARY ANGIOGRAM N/A 06/03/2013   Procedure: LEFT HEART CATHETERIZATION WITH CORONARY ANGIOGRAM;  Surgeon: Sinclair Grooms, MD;  Location: [facility] CATH LAB;  Service: Cardiovascular;  Laterality: N/A;     HPI  from the history and physical done on the day of admission:      Gina Bradley  is a 61 y.o. female w Hypertension, Dm2, HFpEF, steroid dependent Copd, opiate dependence L4 compression fracture/ pubic rami fracture 06/06/21, SVT 06/22/21, septic shock from pneumonia 06/22/21 s/p intubation discharged to SNF 08/04/21, who apparently went home from SNF 3 days ago and c/o weakness.  Pt denies fever, chills, cough, cp, palp, sob, diarrhea, dysuria, hematuria, flank pain, and notes increased weakness.  Unable to Pt takes lasix at home and is not on any potassium supplementation.    Hospital Course:     Assessment and Plan: Hyponatremia/Hypochloremia--- improved with IV fluids -Sodium is up to 133   Hypokalemia/hypomagnesemia Replaced and normalized  E. coli sepsis secondary to urinary source -POA -Met sepsis criteria on admission  -Leukocytosis improving  WBC 18.1 >> 10.6 -Treated with IV Rocephin, E. coli  was pansensitive -Sepsis pathophysiology has resolved   Hypotension-- soft BP, stop metoprolol   Chronic anemia  Hgb labile and close to recent baseline -Admission Hgb was higher than baseline due to dehydration/hemoconcentration   Rib Fractures, pubic rami fractures, L4 compression fracture Cont Gabapentin 321m po q8h Cont oxycodone 148mpo qid prn  Will need outpatient bone density test and antiresorptive agent   Copd /OSA on home o2 Continue PTA bronchodilators   HFpEF/CAD Cont Lasix 2027mo qday Cont Aspirin   DM2 Resume diabetic diet and PTA regimen   RLS Cont Primidone   Anxiety/ Depression/ Insomnia Continue Cymbalta Cont Clonazepam 0.53m64m bid  Cont Trazodone 50mg16mqhs prn    Gerd Cont Protonix   Severe protein Calorie  malnutrition Nutrition consult C/n ensure liquid   Tinea Treated with ketoconazole cream bid   -Generalized weakness and deconditioning--PT eval appreciated recommends SNF rehab patient and husband declined SNF rehab they request discharge home with home health therapy   Disposition--Home with home health services    Code Status : Full Code   Family Communication  :   called RickyAudry [phone] Condition: Stable  Follow UP   Follow-up Information     [employer] Schedule an appointment as soon as possible for a visit.   Specialty: Internal Medicine Why: Repeat CBC and BMP Blood Test Contact information: [phone]    McDowSatira Sark.   Specialty: Cardiology Contact information: [phone]         TayloEvans Lance.   Specialty: Cardiology Contact information: [phone]            Diet and Activity recommendation:  As advised  Discharge Instructions   Discharge Instructions     Call MD for:  difficulty breathing, headache or visual disturbances   Complete by: As directed    Call MD for:  persistant dizziness or light-headedness   Complete by: As directed    Call MD for:  persistant nausea and vomiting   Complete by: As directed    Call MD for:  severe uncontrolled pain   Complete by: As directed    Call MD for:  temperature >100.4   Complete by: As directed    Diet - low sodium heart healthy   Complete by: As directed    Discharge instructions   Complete by: As directed    1)Avoid ibuprofen/Advil/Aleve/Motrin/Goody Powders/Naproxen/BC powders/Meloxicam/Diclofenac/Indomethacin and other Nonsteroidal anti-inflammatory medications as these will make you more likely to bleed and can cause stomach ulcers, can also cause Kidney problems.   2)Repeat CBC and BMP Blood Tests on Tuesday  09/07/21   Increase activity slowly   Complete by: As directed    No wound care   Complete by: As directed          Discharge Medications     Allergies as of 09/04/2021       Reactions   Bee Venom Other (See Comments)   Unknown   Tdap [tetanus-diphth-acell Pertussis] Other (See Comments)   Unknown        Medication List     STOP taking these medications    cyclobenzaprine 5 MG tablet Commonly known as: FLEXERIL   docusate sodium 100 MG capsule Commonly known as: COLACE   methocarbamol 500 MG tablet Commonly known  as: ROBAXIN   metoprolol tartrate 25 MG tablet Commonly known as: LOPRESSOR   polyethylene glycol 17 g packet Commonly known as: MIRALAX / GLYCOLAX   predniSONE 5 MG tablet Commonly known as: DELTASONE   QUEtiapine 50 MG tablet Commonly known as: SEROquel       TAKE these medications    acetaminophen 325 MG tablet Commonly known as: TYLENOL Take 2 tablets (650 mg total) by mouth every 4 (four) hours as needed for mild pain or fever (temp > 101.5).   albuterol 108 (90 Base) MCG/ACT inhaler Commonly known as: VENTOLIN HFA Inhale 1 puff into the lungs every 6 (six) hours as needed for wheezing or shortness of breath.   albuterol (2.5 MG/3ML) 0.083% nebulizer solution Commonly known as: PROVENTIL Take 2.5 mg by nebulization daily as needed.   Anoro Ellipta 62.5-25 MCG/ACT Aepb Generic drug: umeclidinium-vilanterol Inhale 1 puff into the lungs daily.   aspirin EC 81 MG tablet Take 1 tablet (81 mg total) by mouth daily with breakfast. What changed: when to take this   calcium carbonate 500 MG chewable tablet Commonly known as: TUMS - dosed in mg elemental calcium Chew 1 tablet (200 mg of elemental calcium total) by mouth 3 (three) times daily as needed for indigestion or heartburn.   clonazePAM 0.25 MG disintegrating tablet Commonly known as: KLONOPIN Take 1 tablet (0.25 mg total) by mouth 2 (two) times daily.   DULoxetine 20 MG  capsule Commonly known as: CYMBALTA Take 1 capsule (20 mg total) by mouth daily.   EPINEPHrine 0.3 mg/0.3 mL Soaj injection Commonly known as: EpiPen 2-Pak Inject 0.3 mLs (0.3 mg total) into the muscle once as needed (for severe allergic reaction). CAll 911 immediately if you have to use this medicine   fluticasone 50 MCG/ACT nasal spray Commonly known as: FLONASE Place 2 sprays into both nostrils daily.   furosemide 20 MG tablet Commonly known as: LASIX Take 1 tablet (20 mg total) by mouth daily as needed. For fluid/swelling What changed: See the new instructions.   gabapentin 300 MG capsule Commonly known as: NEURONTIN Take 1 capsule (300 mg total) by mouth every 8 (eight) hours.   insulin aspart 100 UNIT/ML injection Commonly known as: novoLOG Inject 0-9 Units into the skin 3 (three) times daily with meals.   nitroGLYCERIN 0.4 MG SL tablet Commonly known as: Nitrostat Place 1 tablet (0.4 mg total) under the tongue every 5 (five) minutes as needed for chest pain.   Oxycodone HCl 10 MG Tabs Take 1 tablet (10 mg total) by mouth 4 (four) times daily as needed.   pantoprazole 40 MG tablet Commonly known as: PROTONIX Take 40 mg by mouth daily.   primidone 50 MG tablet Commonly known as: MYSOLINE Take 50 mg by mouth at bedtime.        Major procedures and Radiology Reports - PLEASE review detailed and final reports for all details, in brief -   CT Angio Chest PE W and/or Wo Contrast  Result Date: 09/01/2021 CLINICAL DATA:  Elevated D-dimer level. Patient found on floor, stool incontinence EXAM: CT ANGIOGRAPHY CHEST WITH CONTRAST TECHNIQUE: Multidetector CT imaging of the chest was performed using the standard protocol during bolus administration of intravenous contrast. Multiplanar CT image reconstructions and MIPs were obtained to evaluate the vascular anatomy. RADIATION DOSE REDUCTION: This exam was performed according to the departmental dose-optimization program which  includes automated exposure control, adjustment of the mA and/or kV according to patient size and/or use of iterative reconstruction technique.  CONTRAST:  23m OMNIPAQUE IOHEXOL 350 MG/ML SOLN COMPARISON:  09/01/2021 and 07/03/2021 FINDINGS: Cardiovascular: No filling defect is identified in the pulmonary arterial tree to suggest pulmonary embolus. Mild aortic atherosclerotic vascular calcification. Mild cardiomegaly. Mediastinum/Nodes: Unremarkable Lungs/Pleura: Ground-glass and linear opacities in the perihilar regions with the airspace opacity substantially improved compared to 07/03/2021. Much of the nodular component of airspace opacity shown on prior exam has resolved. No pleural effusion. Upper Abdomen: Nonspecific questionable 1.3 cm ill-defined hypodensity centrally in the lateral segment left hepatic lobe on image 89 series 4. The upper pole the left kidney is substantially more hypodense than the upper pole the right kidney. I am uncertain whether this is related to the very early phase of contrast or if there is an inflammatory process such as pyelonephritis, correlate with urine analysis. Musculoskeletal: Numerous bilateral rib fractures in various states of healing. There are some new fractures visible compared to 07/03/2021 including the right anterior fifth and sixth ribs as well as the left anterior fourth rib. Review of the MIP images confirms the above findings. IMPRESSION: 1. No filling defect is identified in the pulmonary arterial tree to suggest pulmonary embolus. 2. Substantial improvement in bilateral airspace opacities compared to 07/03/2021. 3. Numerous healing bilateral rib fractures. Compared to 07/03/2021 there are some new healing rib fractures including the right anterior fifth and sixth ribs as well as the left anterior fourth rib. This is in addition to multiple other healing fractures. 4. Asymmetric hypodensity in the parenchyma of the left kidney upper pole compared to the right.  This could be due to the very early phase of contrast causing some asymmetry, but pyelonephritis could cause a similar appearance. Correlate with urine analysis. 5. Nonspecific 1.3 cm ill-defined hypodensity centrally in the lateral segment left hepatic lobe. This could be further assessed with dedicated hepatic imaging if clinically warranted. 6. Mild cardiomegaly.  Aortic Atherosclerosis (ICD10-I70.0). Electronically Signed   By: WVan ClinesM.D.   On: 09/01/2021 14:32   CT ABDOMEN PELVIS W CONTRAST  Result Date: 09/02/2021 CLINICAL DATA:  Acute abdominal pain. EXAM: CT ABDOMEN AND PELVIS WITH CONTRAST TECHNIQUE: Multidetector CT imaging of the abdomen and pelvis was performed using the standard protocol following bolus administration of intravenous contrast. RADIATION DOSE REDUCTION: This exam was performed according to the departmental dose-optimization program which includes automated exposure control, adjustment of the mA and/or kV according to patient size and/or use of iterative reconstruction technique. CONTRAST:  1026mOMNIPAQUE IOHEXOL 300 MG/ML  SOLN COMPARISON:  CT angiogram chest 09/01/2021, CT abdomen and pelvis 08/22/2021. FINDINGS: Lower chest: There is atelectasis in the lung bases. Hepatobiliary: There is focal fatty infiltration along the falciform ligament. Gallbladder and bile ducts are within normal limits. Pancreas: Unremarkable. No pancreatic ductal dilatation or surrounding inflammatory changes. Spleen: Normal in size without focal abnormality. Adrenals/Urinary Tract: There are ill-defined patchy areas of hypodensity with surrounding cortical enhancement in the bilateral kidneys, left greater than right. These are new from the prior examination. Small bilateral renal cysts are unchanged. There is no hydronephrosis or perinephric fluid collection. There is some thickening of the left renal pelvis. The adrenal glands and bladder are within normal limits. Contrast fills the bladder.  Stomach/Bowel: Stomach is within normal limits. Appendix appears normal. No evidence of bowel wall thickening, distention, or inflammatory changes. There is sigmoid colon diverticulosis without evidence for acute diverticulitis. Vascular/Lymphatic: Aortic atherosclerosis. No enlarged abdominal or pelvic lymph nodes. Reproductive: Uterus and bilateral adnexa are unremarkable. Other: There is a small fat  containing umbilical hernia. No ascites. Musculoskeletal: Degenerative changes affect the spine. IMPRESSION: 1. Findings worrisome for bilateral pyelonephritis, left greater than right. This appears severe on the left. No perinephric fluid collection or hydronephrosis. 2.  Sigmoid colon diverticulosis. 3.  Aortic Atherosclerosis (ICD10-I70.0). Electronically Signed   By: Ronney Asters M.D.   On: 09/02/2021 00:27   DG Chest Port 1 View  Result Date: 09/01/2021 CLINICAL DATA:  Question sepsis EXAM: PORTABLE CHEST 1 VIEW COMPARISON:  07/27/2021 FINDINGS: Previously seen tracheostomy tube is no longer present. Heart size is upper limits of normal. Chronic aortic atherosclerosis. Minimal residual atelectasis at the lung bases, improved. No worsening or new finding. No effusion. IMPRESSION: Improved chest radiography.  Minimal residual basilar atelectasis. Electronically Signed   By: Nelson Chimes M.D.   On: 09/01/2021 12:37   DG Hip Unilat W or Wo Pelvis 2-3 Views Left  Result Date: 09/01/2021 CLINICAL DATA:  Question sepsis.  Pain. EXAM: DG HIP (WITH OR WITHOUT PELVIS) 2-3V LEFT COMPARISON:  CT abdomen 06/22/2021 FINDINGS: Healing bilateral rami fractures. Healing right sacral fractures. No hip fracture. Other bones of the pelvis appear normal. IMPRESSION: No acute finding. Healing fractures of the right sacrum and bilateral pubic rami. Electronically Signed   By: Nelson Chimes M.D.   On: 09/01/2021 12:40    Micro Results  Recent Results (from the past 240 hour(s))  Urine Culture     Status: Abnormal    Collection Time: 09/01/21 11:08 AM   Specimen: Urine, Clean Catch  Result Value Ref Range Status   Specimen Description   Final    URINE, CLEAN CATCH Performed at [facility], [address], [place], [place] [address]    Special Requests   Final    NONE Performed at [facility], [address], [place], [place] [address]    Culture >=100,000 COLONIES/mL ESCHERICHIA COLI (A)  Final   Report Status 09/03/2021 FINAL  Final   Organism ID, Bacteria ESCHERICHIA COLI (A)  Final      Susceptibility   Escherichia coli - MIC*    AMPICILLIN <=2 SENSITIVE Sensitive     CEFAZOLIN <=4 SENSITIVE Sensitive     CEFEPIME <=0.12 SENSITIVE Sensitive     CEFTRIAXONE <=0.25 SENSITIVE Sensitive     CIPROFLOXACIN <=0.25 SENSITIVE Sensitive     GENTAMICIN <=1 SENSITIVE Sensitive     IMIPENEM <=0.25 SENSITIVE Sensitive     NITROFURANTOIN <=16 SENSITIVE Sensitive     TRIMETH/SULFA <=20 SENSITIVE Sensitive     AMPICILLIN/SULBACTAM <=2 SENSITIVE Sensitive     PIP/TAZO <=4 SENSITIVE Sensitive     * >=100,000 COLONIES/mL ESCHERICHIA COLI  Culture, blood (Routine X 2) w Reflex to ID Panel     Status: None (Preliminary result)   Collection Time: 09/01/21  2:46 PM   Specimen: BLOOD  Result Value Ref Range Status   Specimen Description BLOOD BLOOD RIGHT HAND  Final   Special Requests   Final    BOTTLES DRAWN AEROBIC ONLY Blood Culture results may not be optimal due to an inadequate volume of blood received in culture bottles   Culture   Final    NO GROWTH 3 DAYS Performed at [facility], [address] [place], [place] [address]    Report Status PENDING  Incomplete  Culture, blood (Routine X 2) w Reflex to ID Panel     Status: None (Preliminary result)   Collection Time: 09/01/21  2:51 PM   Specimen: BLOOD  Result Value Ref Range Status   Specimen  Description BLOOD BLOOD LEFT HAND  Final   Special Requests   Final    BOTTLES DRAWN AEROBIC ONLY Blood Culture adequate volume   Culture   Final    NO  GROWTH 3 DAYS Performed at [facility], [address], [place], [place] [address]    Report Status PENDING  Incomplete    Today   Subjective    Alesia Oshields today has no new complaints -Eating and drinking well -Voiding well, without dysuria -No vomiting or diarrhea          Patient has been seen and examined prior to discharge   Objective   Blood pressure (!) 101/56, pulse 80, temperature 97.8 F (36.6 C), temperature source Oral, resp. rate 18, height 5' 1"  (1.549 m), weight 91.6 kg, last menstrual period 01/05/2015, SpO2 (!) 87 %.   Intake/Output Summary (Last 24 hours) at 09/04/2021 1138 Last data filed at 09/04/2021 0900 Gross per 24 hour  Intake 720 ml  Output 1501 ml  Net -781 ml    Exam Gen:- Awake Alert, in no acute distress  HEENT:- [place].AT, No sclera icterus Neck-Supple Neck,No JVD,.  Lungs-  CTAB , fair air movement bilaterally  CV- S1, S2 normal, RRR Abd-  +ve B.Sounds, Abd Soft, No tenderness, increased truncal adiposity, no CVA tenderness Extremity/Skin:-   good pedal pulses , chronic venous stasis Psych-affect is appropriate, oriented x3 Neuro-generalized weakness, no new focal deficits, no tremors   Data Review   CBC w Diff:  Lab Results  Component Value Date   WBC 10.6 (H) 09/04/2021   HGB 9.5 (L) 09/04/2021   HCT 30.7 (L) 09/04/2021   PLT 395 09/04/2021   LYMPHOPCT 6 09/01/2021   MONOPCT 5 09/01/2021   EOSPCT 0 09/01/2021   BASOPCT 0 09/01/2021    CMP:  Lab Results  Component Value Date   NA 133 (L) 09/04/2021   K 3.5 09/04/2021   CL 97 (L) 09/04/2021   CO2 28 09/04/2021   BUN 11 09/04/2021   CREATININE 0.74 09/04/2021   PROT 5.6 (L) 09/03/2021   ALBUMIN 2.3 (L) 09/04/2021   BILITOT 0.6 09/03/2021   ALKPHOS 74 09/03/2021   AST 14 (L) 09/03/2021   ALT 13 09/03/2021  .  Total Discharge time is about 33 minutes  Roxan Hockey M.D on 09/04/2021 at 11:38 AM  Go to [email or web address] -  for contact info  [facility] - Office   [phone]

## 2021-09-06 LAB — CULTURE, BLOOD (ROUTINE X 2)
Culture: NO GROWTH
Culture: NO GROWTH
Special Requests: ADEQUATE

## 2021-11-12 ENCOUNTER — Emergency Department (HOSPITAL_COMMUNITY)
Admission: EM | Admit: 2021-11-12 | Discharge: 2021-11-12 | Disposition: A | Discharge status: Home / Self Care | Payer: 59 | Attending: Emergency Medicine | Admitting: Emergency Medicine

## 2021-11-12 ENCOUNTER — Other Ambulatory Visit: Payer: Self-pay

## 2021-11-12 ENCOUNTER — Encounter (HOSPITAL_COMMUNITY): Payer: Self-pay

## 2021-11-12 ENCOUNTER — Emergency Department (HOSPITAL_COMMUNITY): Payer: 59

## 2021-11-12 DIAGNOSIS — Z794 Long term (current) use of insulin: Secondary | ICD-10-CM | POA: Insufficient documentation

## 2021-11-12 DIAGNOSIS — Z7982 Long term (current) use of aspirin: Secondary | ICD-10-CM | POA: Insufficient documentation

## 2021-11-12 DIAGNOSIS — J449 Chronic obstructive pulmonary disease, unspecified: Secondary | ICD-10-CM | POA: Insufficient documentation

## 2021-11-12 DIAGNOSIS — I10 Essential (primary) hypertension: Secondary | ICD-10-CM | POA: Insufficient documentation

## 2021-11-12 DIAGNOSIS — E119 Type 2 diabetes mellitus without complications: Secondary | ICD-10-CM | POA: Diagnosis not present

## 2021-11-12 DIAGNOSIS — S2232XA Fracture of one rib, left side, initial encounter for closed fracture: Secondary | ICD-10-CM | POA: Insufficient documentation

## 2021-11-12 DIAGNOSIS — Z7951 Long term (current) use of inhaled steroids: Secondary | ICD-10-CM | POA: Insufficient documentation

## 2021-11-12 DIAGNOSIS — X58XXXA Exposure to other specified factors, initial encounter: Secondary | ICD-10-CM | POA: Insufficient documentation

## 2021-11-12 DIAGNOSIS — S29001A Unspecified injury of muscle and tendon of front wall of thorax, initial encounter: Secondary | ICD-10-CM | POA: Diagnosis present

## 2021-11-12 MED ORDER — OXYCODONE-ACETAMINOPHEN 5-325 MG PO TABS
1.0000 | ORAL_TABLET | Freq: Once | ORAL | Status: AC
  Administered 2021-11-12: 1 via ORAL
  Filled 2021-11-12: qty 1

## 2021-11-12 MED ORDER — KETOROLAC TROMETHAMINE 30 MG/ML IJ SOLN
30.0000 mg | Freq: Once | INTRAMUSCULAR | Status: AC
Start: 2021-11-12 — End: 2021-11-12
  Administered 2021-11-12: 30 mg via INTRAMUSCULAR
  Filled 2021-11-12: qty 1

## 2021-11-12 NOTE — Discharge Instructions (Signed)
Your x-ray today shows that you have a broken rib on the left.  This is the likely source of your pain.  As discussed, I recommend that you use the incentive spirometer several times a day.  You may continue your pain medication as directed.  Your symptoms should improve over the next 1 to 2 weeks.  Avoid twisting bending or heavy lifting.  You may use a small pillow or folded towel held firmly to your side when you need to cough or sneeze.  It is important that you take deep breaths several times a day.  Follow-up with your primary care provider for recheck.  Return to the emergency department for any new or worsening symptoms.

## 2021-11-12 NOTE — ED Triage Notes (Signed)
Patient reports she was picking up something in her yard and had sudden onset of left rib pain

## 2021-11-12 NOTE — ED Provider Notes (Signed)
[facility] EMERGENCY DEPARTMENT Provider Note   CSN: [id number] Arrival date & time: 11/12/21  1046     History {Add pertinent medical, surgical, social history, OB history to HPI:1} Chief Complaint  Patient presents with  . Rib Injury    Gina Bradley is a 61 y.o. female.  HPI      Gina Bradley is a 61 y.o. female with past medical history including COPD, SVT, lymphedema, hypertension type 2 diabetes and prior rib fractures who presents to the Emergency Department complaining of left lateral chest wall pain.  He states that she was wearing a lumbar back brace yesterday and she bent over to pick something up in the yard and felt a sharp sudden pain to her lateral left ribs.  She has had pain to this area since onset, pain worsens with attempted deep breathing, coughing, sneezing, or certain movements.  Pain improves while at rest.  She denies any anterior chest pain or shortness of breath.  States current pain feels similar to previous rib fractures.  She has prescription pain medication at home, but states is not helping. She denies any fever, chills, excessive cough, hemoptysis or shortness of breath.   Home Medications Prior to Admission medications   Medication Sig Start Date End Date Taking? Authorizing Provider  acetaminophen (TYLENOL) 325 MG tablet Take 2 tablets (650 mg total) by mouth every 4 (four) hours as needed for mild pain or fever (temp > 101.5). 08/04/21   Elgergawy, Leana Roe, MD  albuterol (PROVENTIL HFA;VENTOLIN HFA) 108 (90 Base) MCG/ACT inhaler Inhale 1 puff into the lungs every 6 (six) hours as needed for wheezing or shortness of breath.    [provider]  albuterol (PROVENTIL) (2.5 MG/3ML) 0.083% nebulizer solution Take 2.5 mg by nebulization daily as needed.    [provider]  ANORO ELLIPTA 62.5-25 MCG/INH AEPB Inhale 1 puff into the lungs daily.  09/09/16   [provider]  aspirin EC 81 MG tablet Take 1 tablet (81 mg total) by  mouth daily with breakfast. 09/04/21   Shon Hale, MD  calcium carbonate (TUMS - DOSED IN MG ELEMENTAL CALCIUM) 500 MG chewable tablet Chew 1 tablet (200 mg of elemental calcium total) by mouth 3 (three) times daily as needed for indigestion or heartburn. Patient not taking: Reported on 09/01/2021 08/04/21   Elgergawy, Leana Roe, MD  clonazePAM (KLONOPIN) 0.25 MG disintegrating tablet Take 1 tablet (0.25 mg total) by mouth 2 (two) times daily. 08/04/21   Elgergawy, Leana Roe, MD  DULoxetine (CYMBALTA) 20 MG capsule Take 1 capsule (20 mg total) by mouth daily. 08/05/21   Elgergawy, Leana Roe, MD  EPINEPHrine (EPIPEN 2-PAK) 0.3 mg/0.3 mL IJ SOAJ injection Inject 0.3 mLs (0.3 mg total) into the muscle once as needed (for severe allergic reaction). CAll 911 immediately if you have to use this medicine 10/21/15   Eber Hong, MD  fluticasone [facility]) 50 MCG/ACT nasal spray Place 2 sprays into both nostrils daily.    [provider]  furosemide (LASIX) 20 MG tablet Take 1 tablet (20 mg total) by mouth daily as needed. For fluid/swelling 09/04/21   Shon Hale, MD  gabapentin (NEURONTIN) 300 MG capsule Take 1 capsule (300 mg total) by mouth every 8 (eight) hours. 08/04/21   Elgergawy, Leana Roe, MD  insulin aspart (NOVOLOG) 100 UNIT/ML injection Inject 0-9 Units into the skin 3 (three) times daily with meals. 08/04/21   Elgergawy, Leana Roe, MD  nitroGLYCERIN (NITROSTAT) 0.4 MG SL tablet Place  1 tablet (0.4 mg total) under the tongue every 5 (five) minutes as needed for chest pain. 12/22/14   Tanna Furry, MD  Oxycodone HCl 10 MG TABS Take 1 tablet (10 mg total) by mouth 4 (four) times daily as needed. 08/04/21   Elgergawy, Silver Huguenin, MD  pantoprazole (PROTONIX) 40 MG tablet Take 40 mg by mouth daily. 05/25/13   [provider]  primidone (MYSOLINE) 50 MG tablet Take 50 mg by mouth at bedtime.  Patient not taking: Reported on 09/01/2021 04/06/19   [provider]      Allergies    Bee venom and  Tdap [tetanus-diphth-acell pertussis]    Review of Systems   Review of Systems  Constitutional:  Negative for chills and fever.  Respiratory:  Negative for shortness of breath.   Cardiovascular:  Positive for chest pain (Left lateral chest wall pain).  Gastrointestinal:  Negative for abdominal pain, diarrhea, nausea and vomiting.  Musculoskeletal:  Negative for neck pain.  Skin:  Negative for rash and wound.  Neurological:  Negative for dizziness, syncope, weakness, numbness and headaches.    Physical Exam Updated Vital Signs BP (!) 155/85 (BP Location: Right Arm)   Pulse 95   Temp 98 F (36.7 C)   Resp 20   Ht 5\' 1"  (1.549 m)   Wt 90.7 kg   LMP 01/05/2015   SpO2 100%   BMI 37.79 kg/m  Physical Exam Vitals and nursing note reviewed.  Constitutional:      General: She is not in acute distress.    Appearance: Normal appearance.  HENT:     Head: Atraumatic.  Cardiovascular:     Rate and Rhythm: Normal rate and regular rhythm.     Pulses: Normal pulses.  Pulmonary:     Effort: Pulmonary effort is normal.     Breath sounds: Normal breath sounds.  Chest:     Chest wall: Tenderness (Focal tenderness to palpation of the mid to upper left lateral chest wall.  No bony deformity or crepitus.) present.  Abdominal:     General: There is no distension.     Palpations: Abdomen is soft.     Tenderness: There is no abdominal tenderness.  Musculoskeletal:        General: Normal range of motion.     Right lower leg: No edema.     Left lower leg: No edema.  Skin:    General: Skin is warm.     Capillary Refill: Capillary refill takes less than 2 seconds.     Findings: No erythema or rash.  Neurological:     General: No focal deficit present.     Mental Status: She is alert.     Sensory: No sensory deficit.     Motor: No weakness.    ED Results / Procedures / Treatments   Labs (all labs ordered are listed, but only abnormal results are displayed) Labs Reviewed - No data to  display  EKG None  Radiology DG Ribs Unilateral W/Chest Left  Result Date: 11/12/2021 CLINICAL DATA:  Left chest wall pain after picking something up in the yard several days ago. EXAM: LEFT RIBS AND CHEST - 3+ VIEW COMPARISON:  09/01/2021. FINDINGS: Acute nondisplaced fracture of the left anterolateral seventh rib. Old fractures of the left anterolateral eighth and ninth ribs. No bone lesions.  Old healed right rib fractures. Cardiac silhouette top-normal in size, stable. No mediastinal or hilar masses. Clear lungs.  No convincing pleural effusion.  No pneumothorax. IMPRESSION: 1.  Acute nondisplaced fracture of the left anterolateral seventh rib. 2. No other acute findings.  No other recent fractures. Electronically Signed   By: Amie Portland M.D.   On: 11/12/2021 12:01    Procedures Procedures  {Document cardiac monitor, telemetry assessment procedure when appropriate:1}  Medications Ordered in ED Medications  ketorolac (TORADOL) 30 MG/ML injection 30 mg (has no administration in time range)  oxyCODONE-acetaminophen (PERCOCET/ROXICET) 5-325 MG per tablet 1 tablet (has no administration in time range)    ED Course/ Medical Decision Making/ A&P                           Medical Decision Making Patient here for evaluation of left lateral chest wall pain since yesterday.  States she was wearing a lumbar back brace and bent over to pick something up out of the yard.  Felt a sharp pain to her lateral chest wall that has been persistent since onset.  No shortness of breath, excessive cough or hemoptysis.  On exam, patient well-appearing nontoxic.  Vital signs are reassuring although she is slightly hypertensive.  Has history of same.  No hypoxia tachycardia or tachypnea.  She does have focal tenderness along the lateral chest wall.  I suspect she has rib fracture, differential would also include contusion, pneumothorax, PNA, cardiac process.  Given sudden onset of her symptoms after bending  over.  I suspect this is related to rib injury  Amount and/or Complexity of Data Reviewed Radiology: ordered.    Details: Rib with chest film shows an acute nondisplaced fracture of the left seventh rib.  No evidence of pneumothorax.  I personally reviewed radiology imaging and agree with interpretation. Discussion of management or test interpretation with external provider(s): Discussed findings with the patient.  She is agreeable to symptomatic treatment, has pain medication at home.  Given p.o. and IM medicine here for pain.  She was dispensed incentive spirometer for home use.  I feel that she is appropriate for discharge home, will follow-up with PCP.  Return precautions were discussed.  Risk Prescription drug management.     {Document critical care time when appropriate:1} {Document review of labs and clinical decision tools ie heart score, Chads2Vasc2 etc:1}  {Document your independent review of radiology images, and any outside records:1} {Document your discussion with family members, caretakers, and with consultants:1} {Document social determinants of health affecting pt's care:1} {Document your decision making why or why not admission, treatments were needed:1} Final Clinical Impression(s) / ED Diagnoses Final diagnoses:  None    Rx / DC Orders ED Discharge Orders     None

## 2022-01-28 IMAGING — CT CT HEAD W/O CM
5 of 7 series · 17 of 47 positions shown, 19 images · non-contrast
Comparison: 06/24/2019

CLINICAL DATA: Ataxia

EXAM:
CT HEAD WITHOUT CONTRAST
TECHNIQUE: Contiguous axial images were obtained from the base of the skull
through the vertex without intravenous contrast.

[Series 2: head w o · axial · 0.45mm/px · z∈[+70,+170]mm · 5 of 32 slices shown, 7 images (1 of 2)]
[im 6/32  brain]
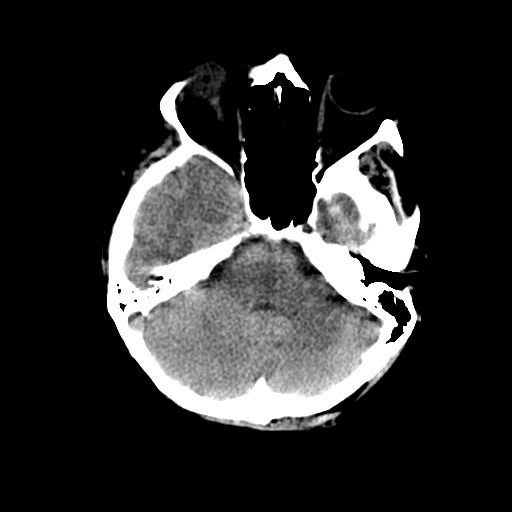
[im 6/32  bone]
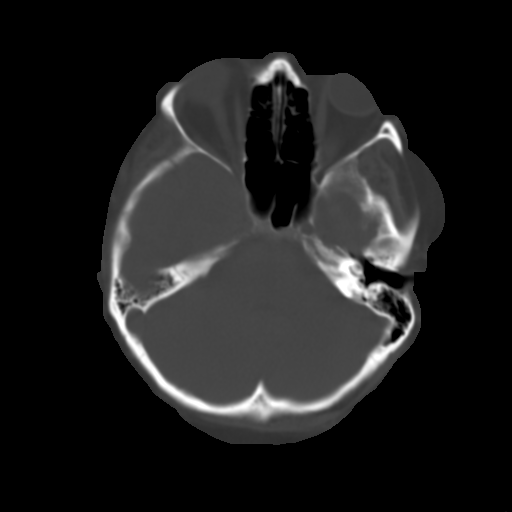
[im 11/32  brain]
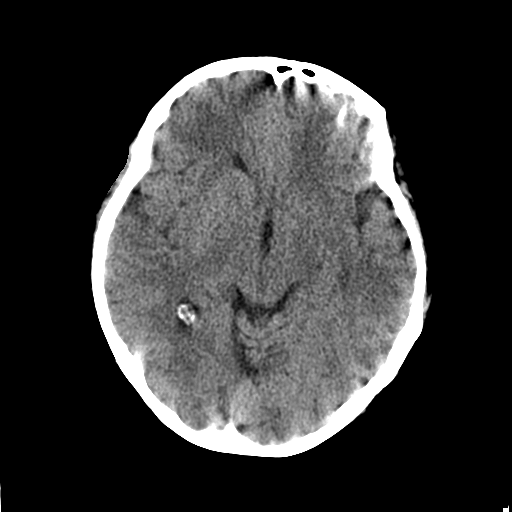
[im 16/32  brain]
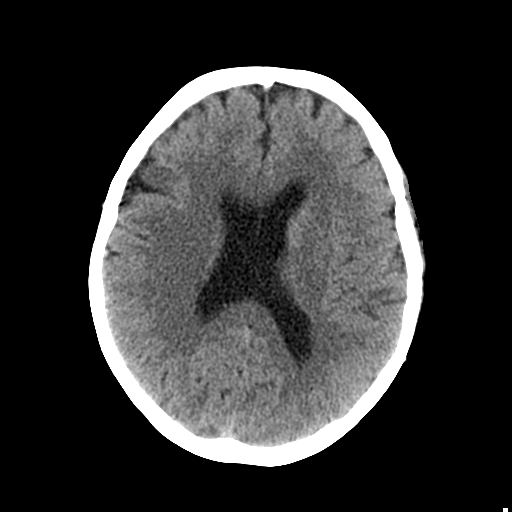
[im 21/32  brain]
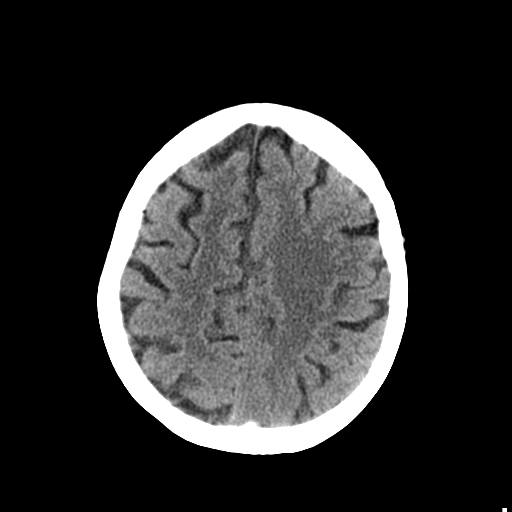
[im 26/32  brain]
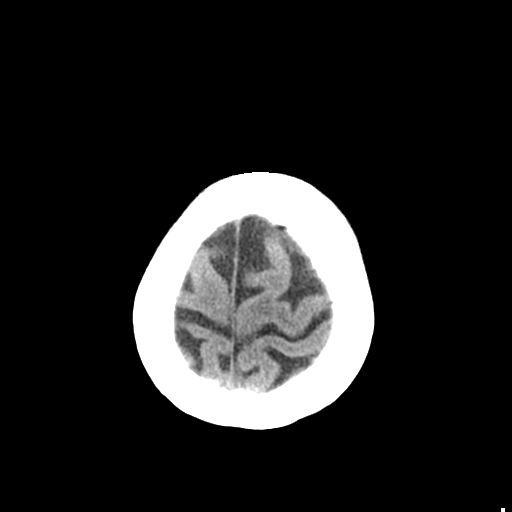
[im 26/32  bone]
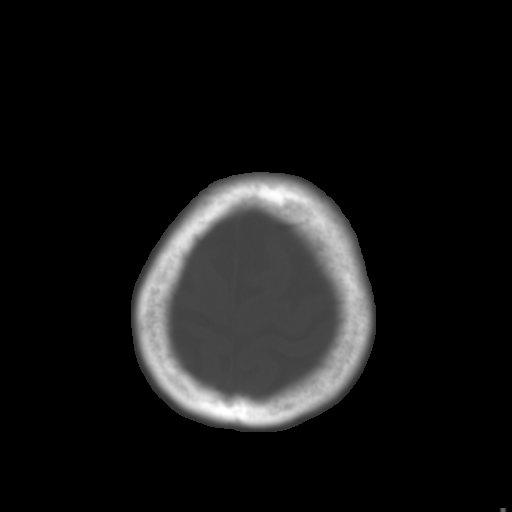

[Series 3: head bone · axial · 0.45mm/px · z∈[+55,+75]mm · 2 of 82 slices shown]
[im 6/82  bone]
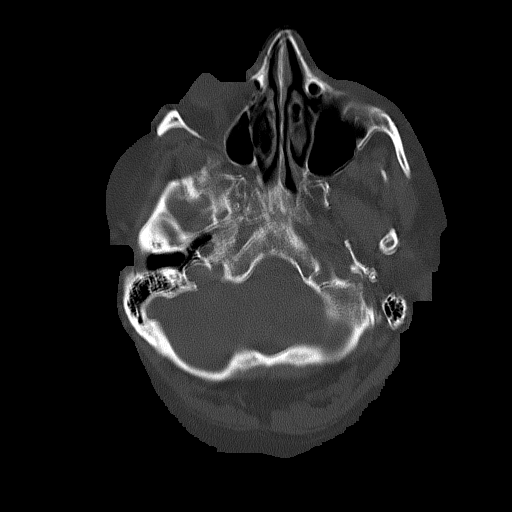
[im 16/82  bone]
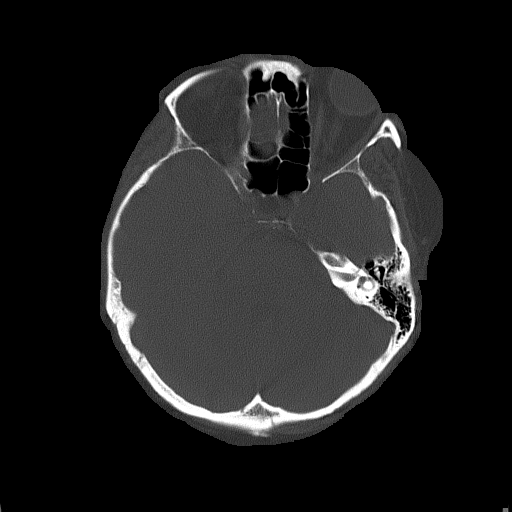

[Series 5: sagittal soft · sagittal · 0.30mm/px · 3 of 59 slices shown]
[im 15/59  brain]
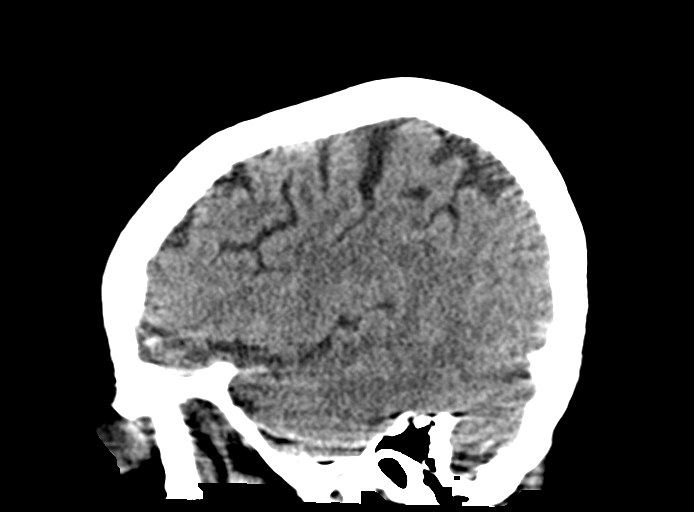
[im 30/59  brain]
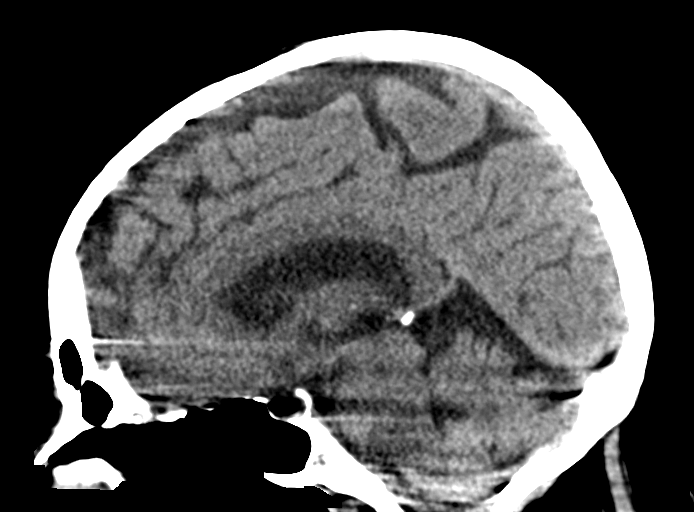
[im 44/59  brain]
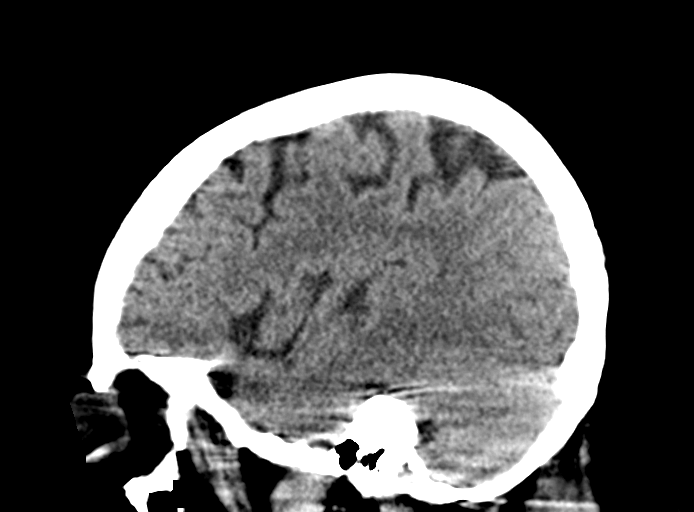

[Series 6: head w o · axial · 0.45mm/px · z∈[+70,+150]mm · 4 of 28 slices shown (2 of 2)]
[im 6/28  brain]
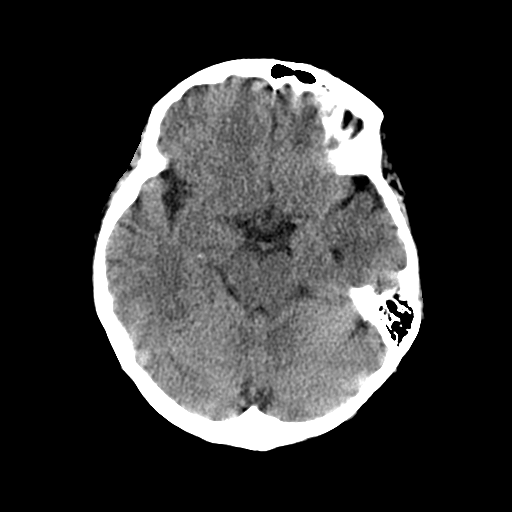
[im 11/28  brain]
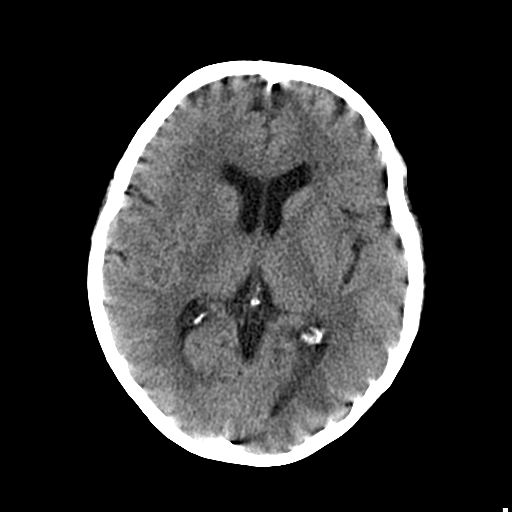
[im 17/28  brain]
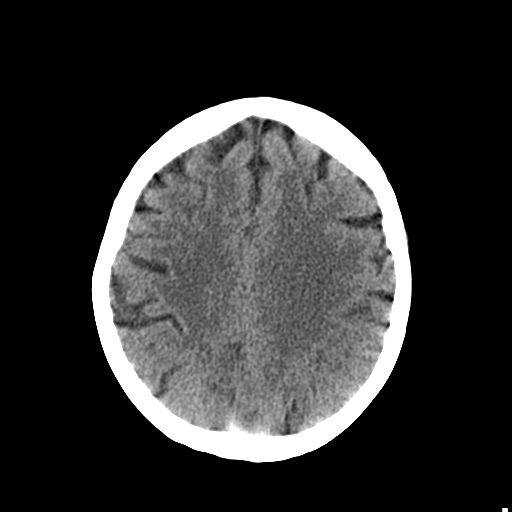
[im 22/28  brain]
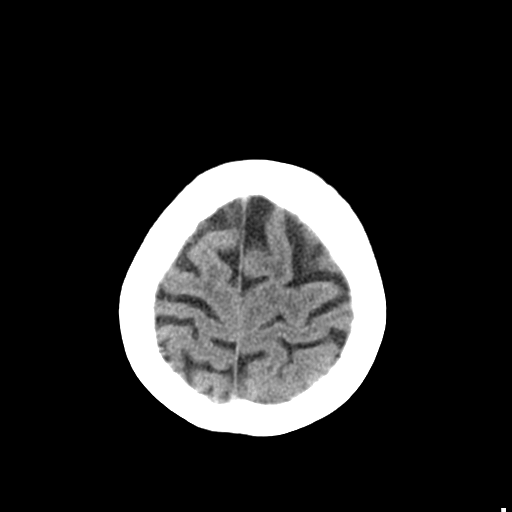

[Series 8: coronal soft · coronal · 0.32mm/px · 3 of 67 slices shown]
[im 17/67  brain]
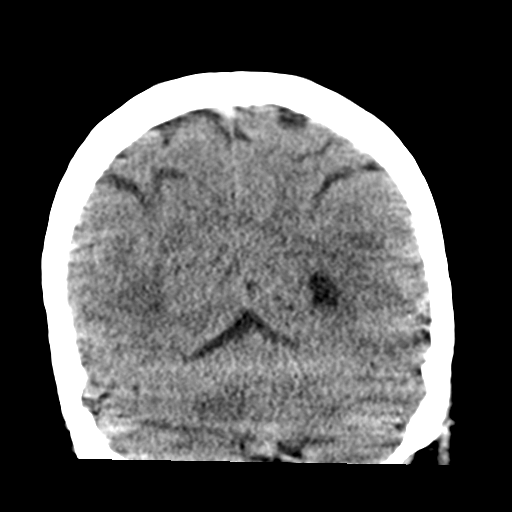
[im 34/67  brain]
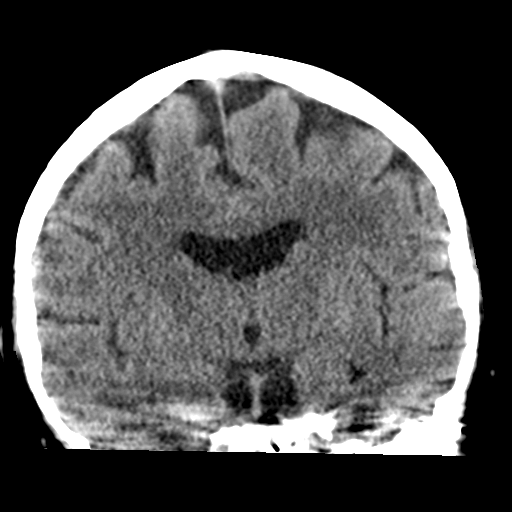
[im 50/67  brain]
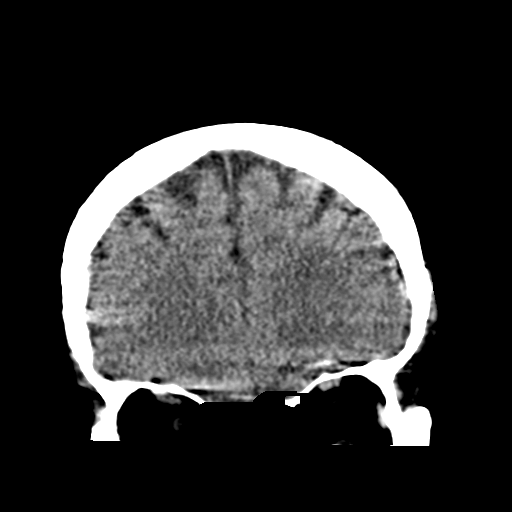

[17 of 47 positions shown; findings below may reference images not displayed]

FINDINGS: Brain: Mild diffuse cerebral atrophy. No acute intracranial
abnormality. Specifically, no hemorrhage, hydrocephalus, mass
lesion, acute infarction, or significant intracranial injury.

Vascular: No hyperdense vessel or unexpected calcification.

Skull: No acute calvarial abnormality.

Sinuses/Orbits: Visualized paranasal sinuses and mastoids clear.
Orbital soft tissues unremarkable.

Other: None
IMPRESSION: No acute intracranial abnormality.

## 2022-06-17 DIAGNOSIS — E1140 Type 2 diabetes mellitus with diabetic neuropathy, unspecified: Secondary | ICD-10-CM | POA: Diagnosis not present

## 2022-06-17 DIAGNOSIS — J440 Chronic obstructive pulmonary disease with acute lower respiratory infection: Secondary | ICD-10-CM | POA: Diagnosis not present

## 2022-06-17 DIAGNOSIS — I5032 Chronic diastolic (congestive) heart failure: Secondary | ICD-10-CM | POA: Diagnosis not present

## 2022-06-17 DIAGNOSIS — M47816 Spondylosis without myelopathy or radiculopathy, lumbar region: Secondary | ICD-10-CM | POA: Diagnosis not present

## 2022-06-17 DIAGNOSIS — E1165 Type 2 diabetes mellitus with hyperglycemia: Secondary | ICD-10-CM | POA: Diagnosis not present

## 2022-06-17 DIAGNOSIS — I700 Atherosclerosis of aorta: Secondary | ICD-10-CM | POA: Diagnosis not present

## 2022-06-17 DIAGNOSIS — M1991 Primary osteoarthritis, unspecified site: Secondary | ICD-10-CM | POA: Diagnosis not present

## 2022-06-17 DIAGNOSIS — G894 Chronic pain syndrome: Secondary | ICD-10-CM | POA: Diagnosis not present

## 2022-06-17 DIAGNOSIS — M5416 Radiculopathy, lumbar region: Secondary | ICD-10-CM | POA: Diagnosis not present

## 2022-06-27 DIAGNOSIS — J9620 Acute and chronic respiratory failure, unspecified whether with hypoxia or hypercapnia: Secondary | ICD-10-CM | POA: Diagnosis not present

## 2022-07-14 DIAGNOSIS — L570 Actinic keratosis: Secondary | ICD-10-CM | POA: Diagnosis not present

## 2022-07-14 DIAGNOSIS — G894 Chronic pain syndrome: Secondary | ICD-10-CM | POA: Diagnosis not present

## 2022-07-14 DIAGNOSIS — Z8619 Personal history of other infectious and parasitic diseases: Secondary | ICD-10-CM | POA: Diagnosis not present

## 2022-07-28 DIAGNOSIS — J9620 Acute and chronic respiratory failure, unspecified whether with hypoxia or hypercapnia: Secondary | ICD-10-CM | POA: Diagnosis not present

## 2022-07-29 DIAGNOSIS — Z0001 Encounter for general adult medical examination with abnormal findings: Secondary | ICD-10-CM | POA: Diagnosis not present

## 2022-07-29 DIAGNOSIS — E559 Vitamin D deficiency, unspecified: Secondary | ICD-10-CM | POA: Diagnosis not present

## 2022-07-29 DIAGNOSIS — E1140 Type 2 diabetes mellitus with diabetic neuropathy, unspecified: Secondary | ICD-10-CM | POA: Diagnosis not present

## 2022-07-29 DIAGNOSIS — E039 Hypothyroidism, unspecified: Secondary | ICD-10-CM | POA: Diagnosis not present

## 2022-07-29 DIAGNOSIS — D518 Other vitamin B12 deficiency anemias: Secondary | ICD-10-CM | POA: Diagnosis not present

## 2022-08-12 DIAGNOSIS — J440 Chronic obstructive pulmonary disease with acute lower respiratory infection: Secondary | ICD-10-CM | POA: Diagnosis not present

## 2022-08-12 DIAGNOSIS — J449 Chronic obstructive pulmonary disease, unspecified: Secondary | ICD-10-CM | POA: Diagnosis not present

## 2022-08-12 DIAGNOSIS — E1165 Type 2 diabetes mellitus with hyperglycemia: Secondary | ICD-10-CM | POA: Diagnosis not present

## 2022-08-12 DIAGNOSIS — I700 Atherosclerosis of aorta: Secondary | ICD-10-CM | POA: Diagnosis not present

## 2022-08-12 DIAGNOSIS — M5416 Radiculopathy, lumbar region: Secondary | ICD-10-CM | POA: Diagnosis not present

## 2022-08-12 DIAGNOSIS — E1140 Type 2 diabetes mellitus with diabetic neuropathy, unspecified: Secondary | ICD-10-CM | POA: Diagnosis not present

## 2022-08-12 DIAGNOSIS — J302 Other seasonal allergic rhinitis: Secondary | ICD-10-CM | POA: Diagnosis not present

## 2022-08-12 DIAGNOSIS — M47816 Spondylosis without myelopathy or radiculopathy, lumbar region: Secondary | ICD-10-CM | POA: Diagnosis not present

## 2022-08-12 DIAGNOSIS — I5032 Chronic diastolic (congestive) heart failure: Secondary | ICD-10-CM | POA: Diagnosis not present

## 2022-08-27 DIAGNOSIS — J9620 Acute and chronic respiratory failure, unspecified whether with hypoxia or hypercapnia: Secondary | ICD-10-CM | POA: Diagnosis not present

## 2022-08-31 DIAGNOSIS — C44329 Squamous cell carcinoma of skin of other parts of face: Secondary | ICD-10-CM | POA: Diagnosis not present

## 2022-08-31 DIAGNOSIS — C44319 Basal cell carcinoma of skin of other parts of face: Secondary | ICD-10-CM | POA: Diagnosis not present

## 2022-08-31 DIAGNOSIS — L821 Other seborrheic keratosis: Secondary | ICD-10-CM | POA: Diagnosis not present

## 2022-08-31 DIAGNOSIS — C44619 Basal cell carcinoma of skin of left upper limb, including shoulder: Secondary | ICD-10-CM | POA: Diagnosis not present

## 2022-08-31 DIAGNOSIS — C44321 Squamous cell carcinoma of skin of nose: Secondary | ICD-10-CM | POA: Diagnosis not present

## 2022-09-14 DIAGNOSIS — R5383 Other fatigue: Secondary | ICD-10-CM | POA: Diagnosis not present

## 2022-09-14 DIAGNOSIS — J440 Chronic obstructive pulmonary disease with acute lower respiratory infection: Secondary | ICD-10-CM | POA: Diagnosis not present

## 2022-09-14 DIAGNOSIS — I471 Supraventricular tachycardia, unspecified: Secondary | ICD-10-CM | POA: Diagnosis not present

## 2022-09-14 DIAGNOSIS — I700 Atherosclerosis of aorta: Secondary | ICD-10-CM | POA: Diagnosis not present

## 2022-09-14 DIAGNOSIS — E1140 Type 2 diabetes mellitus with diabetic neuropathy, unspecified: Secondary | ICD-10-CM | POA: Diagnosis not present

## 2022-09-14 DIAGNOSIS — I10 Essential (primary) hypertension: Secondary | ICD-10-CM | POA: Diagnosis not present

## 2022-09-14 DIAGNOSIS — E1165 Type 2 diabetes mellitus with hyperglycemia: Secondary | ICD-10-CM | POA: Diagnosis not present

## 2022-09-14 DIAGNOSIS — G894 Chronic pain syndrome: Secondary | ICD-10-CM | POA: Diagnosis not present

## 2022-09-14 DIAGNOSIS — M47816 Spondylosis without myelopathy or radiculopathy, lumbar region: Secondary | ICD-10-CM | POA: Diagnosis not present

## 2022-09-14 DIAGNOSIS — I5032 Chronic diastolic (congestive) heart failure: Secondary | ICD-10-CM | POA: Diagnosis not present

## 2022-09-27 DIAGNOSIS — J9620 Acute and chronic respiratory failure, unspecified whether with hypoxia or hypercapnia: Secondary | ICD-10-CM | POA: Diagnosis not present

## 2022-10-05 DIAGNOSIS — G8929 Other chronic pain: Secondary | ICD-10-CM | POA: Diagnosis not present

## 2022-10-11 NOTE — Progress Notes (Deleted)
   Cardiology Office Note    Date:  10/11/2022  ID:  BRUCE MAYERS, DOB 04/18/1960, MRN [id number] Cardiologist: Nona Dell, MD    History of Present Illness:    Gina Bradley is a 62 y.o. female with past medical history of PSVT, Type 2 DM, HTN, HLD, lymphedema and COPD who presents to the office today for overdue follow-up.  She was last evaluated by the Cardiology service during an admission from 3/21 to 08/04/2021 for acute hypoxic respiratory failure in the setting of pneumonia. Cardiology was consulted during admission as she had frequent episodes of SVT and initially required the use of IV Amiodarone but this was stopped later during her admission and she was continued on Lopressor 25mg  BID. She did ultimately require tracheostomy placement given failure to wean and this was decannulated on 07/29/2021 and was discharged to SNF. She has not been evaluated by Cardiology since this admission.   ROS: ***  Studies Reviewed:   EKG: EKG is*** ordered today and demonstrates ***   EKG Interpretation Date/Time:    Ventricular Rate:    PR Interval:    QRS Duration:    QT Interval:    QTC Calculation:   R Axis:      Text Interpretation:         Echocardiogram: 06/2021 IMPRESSIONS     1. Left ventricular ejection fraction, by estimation, is 65 to 70%. The  left ventricle has normal function. The left ventricle has no regional  wall motion abnormalities. Left ventricular diastolic parameters are  indeterminate.   2. Right ventricular systolic function is normal. The right ventricular  size is normal. Tricuspid regurgitation signal is inadequate for assessing  PA pressure.   3. The mitral valve is normal in structure. No evidence of mitral valve  regurgitation.   4. The aortic valve was not well visualized. Aortic valve regurgitation  is not visualized. No aortic stenosis is present.   Risk Assessment/Calculations:   {Does this patient have ATRIAL  FIBRILLATION?:[phone]} No BP recorded.  {Refresh Note OR Click here to enter BP  :1}***         Physical Exam:   VS:  LMP 01/05/2015    Wt Readings from Last 3 Encounters:  11/12/21 200 lb (90.7 kg)  09/01/21 201 lb 15.1 oz (91.6 kg)  08/03/21 210 lb 5.1 oz (95.4 kg)     GEN: Well nourished, well developed in no acute distress NECK: No JVD; No carotid bruits CARDIAC: ***RRR, no murmurs, rubs, gallops RESPIRATORY:  Clear to auscultation without rales, wheezing or rhonchi  ABDOMEN: Appears non-distended. No obvious abdominal masses. EXTREMITIES: No clubbing or cyanosis. No edema.  Distal pedal pulses are 2+ bilaterally.   Assessment and Plan:   1. SVT - ***  2. HTN - ***  Signed, Ellsworth Lennox, PA-C

## 2022-10-12 ENCOUNTER — Ambulatory Visit: Payer: 59 | Admitting: Student

## 2022-10-27 DIAGNOSIS — J9620 Acute and chronic respiratory failure, unspecified whether with hypoxia or hypercapnia: Secondary | ICD-10-CM | POA: Diagnosis not present

## 2022-10-31 NOTE — Progress Notes (Deleted)
Name: Gina Bradley DOB: May 18, 1960 MRN: [id number]  History of Present Illness: Ms. Gina Bradley is a 62 y.o. female who presents today as a new patient at [facility] [place].  ***no recent relevant records   She reports chief complaint of ***gross hematuria. She reports that the visible hematuria was first noticed *** ago and {gen continuous/intermittent/once:313061} (***still ongoing ***?). She {Actions; denies-reports:120008} prior history of gross hematuria.   Today She {Actions; denies-reports:120008} urinary urgency, frequency, dysuria, straining to void, or sensations of incomplete emptying. She {Actions; denies-reports:120008} ***abdominal or ***flank pain. She {Actions; denies-reports:120008} fevers.  She {Actions; denies-reports:120008} history of kidney stones.  She {Actions; denies-reports:120008} history of pyelonephritis.  She {Actions; denies-reports:120008} history of recent or recurrent UTI. She {Actions; denies-reports:120008} history of GU malignancy or pelvic radiation.  She {Actions; denies-reports:120008} history of autoimmune disease. She {Actions; denies-reports:120008} history of smoking (quit***; smoked*** ppd x*** years). She {Actions; denies-reports:120008} known occupational risks. She {Actions; denies-reports:120008} recent vigorous exercise which they think may be contributory to hematuria. She {Actions; denies-reports:120008} any recent trauma or prolonged pressure to the perineal area. She {Actions; denies-reports:120008} recent illness. She {Actions; denies-reports:120008} taking anticoagulants (***).  Fall Screening: Do you usually have a device to assist in your mobility? {yes/no:20286} ***cane / ***walker / ***wheelchair  Medications: Current Outpatient Medications  Medication Sig Dispense Refill   acetaminophen (TYLENOL) 325 MG tablet Take 2 tablets (650 mg total) by mouth every 4 (four) hours as needed for mild pain or fever (temp >  101.5).     albuterol (PROVENTIL HFA;VENTOLIN HFA) 108 (90 Base) MCG/ACT inhaler Inhale 1 puff into the lungs every 6 (six) hours as needed for wheezing or shortness of breath.     albuterol (PROVENTIL) (2.5 MG/3ML) 0.083% nebulizer solution Take 2.5 mg by nebulization daily as needed.     ANORO ELLIPTA 62.5-25 MCG/INH AEPB Inhale 1 puff into the lungs daily.   11   aspirin EC 81 MG tablet Take 1 tablet (81 mg total) by mouth daily with breakfast. 30 tablet 12   calcium carbonate (TUMS - DOSED IN MG ELEMENTAL CALCIUM) 500 MG chewable tablet Chew 1 tablet (200 mg of elemental calcium total) by mouth 3 (three) times daily as needed for indigestion or heartburn. (Patient not taking: Reported on 09/01/2021)     clonazePAM (KLONOPIN) 0.25 MG disintegrating tablet Take 1 tablet (0.25 mg total) by mouth 2 (two) times daily. 10 tablet 0   DULoxetine (CYMBALTA) 20 MG capsule Take 1 capsule (20 mg total) by mouth daily.  3   EPINEPHrine (EPIPEN 2-PAK) 0.3 mg/0.3 mL IJ SOAJ injection Inject 0.3 mLs (0.3 mg total) into the muscle once as needed (for severe allergic reaction). CAll 911 immediately if you have to use this medicine 2 Device 1   fluticasone (FLONASE) 50 MCG/ACT nasal spray Place 2 sprays into both nostrils daily.     furosemide (LASIX) 20 MG tablet Take 1 tablet (20 mg total) by mouth daily as needed. For fluid/swelling 30 tablet 0   gabapentin (NEURONTIN) 300 MG capsule Take 1 capsule (300 mg total) by mouth every 8 (eight) hours.     insulin aspart (NOVOLOG) 100 UNIT/ML injection Inject 0-9 Units into the skin 3 (three) times daily with meals. 10 mL 11   nitroGLYCERIN (NITROSTAT) 0.4 MG SL tablet Place 1 tablet (0.4 mg total) under the tongue every 5 (five) minutes as needed for chest pain. 10 tablet 0   Oxycodone HCl 10 MG TABS Take 1 tablet (10 mg total)  by mouth 4 (four) times daily as needed. 10 tablet 0   pantoprazole (PROTONIX) 40 MG tablet Take 40 mg by mouth daily.     primidone (MYSOLINE)  50 MG tablet Take 50 mg by mouth at bedtime.  (Patient not taking: Reported on 09/01/2021)     No current facility-administered medications for this visit.    Allergies: Allergies  Allergen Reactions   Bee Venom Other (See Comments)    Unknown   Tdap [Tetanus-Diphth-Acell Pertussis] Other (See Comments)    Unknown    Past Medical History:  Diagnosis Date   Anemia    Bilateral pubic rami fractures (HCC) 06/06/2021   Cervical disc disease    COPD (chronic obstructive pulmonary disease) (HCC)    on home o2   Diabetes mellitus without complication (HCC)    Essential hypertension    GERD (gastroesophageal reflux disease)    Heart failure (HCC) 2018   History of cardiac catheterization    Normal coronaries March 2015   History of non-ST elevation myocardial infarction (NSTEMI)    Secondary to SVT   History of vertebral fracture 06/06/2021   L4   Lymphedema    Peripheral neuropathy    Rib fracture    09/01/2021   Sacral fracture (HCC)    Sleep apnea    Spinal stenosis    SVT (supraventricular tachycardia) (HCC)    Past Surgical History:  Procedure Laterality Date   CESAREAN SECTION     ESOPHAGOGASTRODUODENOSCOPY N/A 02/10/2014   WUJ:[id number] tiny antral erosion; otherwise normal EGD. No explanation for patient's symptoms. Gallbladder needs further evaluation.   LEFT HEART CATHETERIZATION WITH CORONARY ANGIOGRAM N/A 06/03/2013   Procedure: LEFT HEART CATHETERIZATION WITH CORONARY ANGIOGRAM;  Surgeon: Lesleigh Noe, MD;  Location[facility] CATH LAB;  Service: Cardiovascular;  Laterality: N/A;   Family History  Problem Relation Age of Onset   Heart attack Mother    Diabetes Mother    Hypertension Mother    Angina Mother    Diabetes Father    Hypertension Father    Heart failure Brother    Suicidality Sister    Colon cancer Other        Possibly Dad    Stroke Maternal Grandmother    Heart failure Maternal Grandmother    Diabetes Maternal Grandfather    Hypertension Daughter     Hypertension Son    Social History   Socioeconomic History   Marital status: Married    Spouse name: Not on file   Number of children: Not on file   Years of education: Not on file   Highest education level: Not on file  Occupational History   Occupation: [employer]    Employer: [employer]  Tobacco Use   Smoking status: Former    Current packs/day: 0.00    Average packs/day: 1 pack/day for 18.0 years (18.0 ttl pk-yrs)    Types: Cigarettes    Start date: 09/17/2003    Quit date: 02/01/2019    Years since quitting: 3.7   Smokeless tobacco: Never  Vaping Use   Vaping status: Never Used  Substance and Sexual Activity   Alcohol use: No    Alcohol/week: 0.0 standard drinks of alcohol   Drug use: Yes    Types: Marijuana   Sexual activity: Not Currently    Birth control/protection: Post-menopausal  Other Topics Concern   Not on file  Social History Narrative   Not on file   Social Determinants of Corporate investment banker  Strain: Not on file  Food Insecurity: Not on file  Transportation Needs: Not on file  Physical Activity: Not on file  Stress: Not on file  Social Connections: Not on file  Intimate Partner Violence: Not on file    SUBJECTIVE  Review of Systems Constitutional: Patient ***denies any unintentional weight loss or change in strength lntegumentary: Patient ***denies any rashes or pruritus Eyes: Patient denies ***dry eyes ENT: Patient ***denies dry mouth Cardiovascular: Patient ***denies chest pain or syncope Respiratory: Patient ***denies shortness of breath Gastrointestinal: Patient ***denies nausea, vomiting, constipation, or diarrhea Musculoskeletal: Patient ***denies muscle cramps or weakness Neurologic: Patient ***denies convulsions or seizures Psychiatric: Patient ***denies memory problems Allergic/Immunologic: Patient ***denies recent allergic reaction(s) Hematologic/Lymphatic: Patient denies bleeding tendencies Endocrine: Patient  ***denies heat/cold intolerance  GU: As per HPI.  OBJECTIVE There were no vitals filed for this visit. There is no height or weight on file to calculate BMI.  Physical Examination  Constitutional: ***No obvious distress; patient is ***non-toxic appearing  Cardiovascular: ***No visible lower extremity edema.  Respiratory: The patient does ***not have audible wheezing/stridor; respirations do ***not appear labored  Gastrointestinal: Abdomen ***non-distended Musculoskeletal: ***Normal ROM of UEs  Skin: ***No obvious rashes/open sores  Neurologic: CN 2-12 grossly ***intact Psychiatric: Answered questions ***appropriately with ***normal affect  Hematologic/Lymphatic/Immunologic: ***No obvious bruises or sites of spontaneous bleeding  UA: {Desc; negative/positive:13464} for *** WBC/hpf, *** RBC/hpf, bacteria (***) PVR: *** ml  ASSESSMENT No diagnosis found.  For management of gross hematuria, we discussed possible etiologies including but not limited to: vigorous exercise, sexual activity, stone, trauma, blood thinner use, urinary tract infection, kidney function, ***BPH, ***radiation cystitis, malignancy. ***We discussed pt's smoking as a risk factor for GU cancer and encouraged ***continued smoking cessation.***  We discussed the importance of work-up including assessing the upper and lower GU tract with CT urogram and cystoscopy. We will also check ***CMP, ***CBC, and ***voided cytology.  We discussed the risk for clot retention and pt was advised to increase fluid intake to thin out clots. Pt was advised to go to the ER if they become unable to urinate due to clot retention, start having symptoms of anemia (which were discussed), or any other significant concerning acute symptoms.  Pt verbalized understanding and decided to pursue this work-up. Patient agreed to follow-up afterward to discuss the results and formulate a treatment plan based on the findings. All questions were  answered.   PLAN Advised the following: CT ***ordered. Labs (CBC, CMP, ***PSA) ordered. Voided cytology ***ordered. 4. ***No follow-ups on file.  No orders of the defined types were placed in this encounter.   It has been explained that the patient is to follow regularly with their PCP in addition to all other providers involved in their care and to follow instructions provided by these respective offices. Patient advised to contact urology clinic if any urologic-pertaining questions, concerns, new symptoms or problems arise in the interim period.  There are no Patient Instructions on file for this visit.  Electronically signed by:  Donnita Falls, MSN, FNP-C, CUNP 10/31/2022 1:16 PM

## 2022-11-01 ENCOUNTER — Ambulatory Visit: Payer: 59 | Admitting: Urology

## 2022-11-01 DIAGNOSIS — R319 Hematuria, unspecified: Secondary | ICD-10-CM

## 2022-11-14 DIAGNOSIS — G894 Chronic pain syndrome: Secondary | ICD-10-CM | POA: Diagnosis not present

## 2022-11-27 DIAGNOSIS — J9620 Acute and chronic respiratory failure, unspecified whether with hypoxia or hypercapnia: Secondary | ICD-10-CM | POA: Diagnosis not present

## 2022-11-30 DIAGNOSIS — L308 Other specified dermatitis: Secondary | ICD-10-CM | POA: Diagnosis not present

## 2022-11-30 DIAGNOSIS — Z85828 Personal history of other malignant neoplasm of skin: Secondary | ICD-10-CM | POA: Diagnosis not present

## 2022-11-30 DIAGNOSIS — Z08 Encounter for follow-up examination after completed treatment for malignant neoplasm: Secondary | ICD-10-CM | POA: Diagnosis not present

## 2022-11-30 DIAGNOSIS — L821 Other seborrheic keratosis: Secondary | ICD-10-CM | POA: Diagnosis not present

## 2022-11-30 DIAGNOSIS — L720 Epidermal cyst: Secondary | ICD-10-CM | POA: Diagnosis not present

## 2022-12-14 DIAGNOSIS — R0781 Pleurodynia: Secondary | ICD-10-CM | POA: Diagnosis not present

## 2022-12-14 DIAGNOSIS — G894 Chronic pain syndrome: Secondary | ICD-10-CM | POA: Diagnosis not present

## 2022-12-28 DIAGNOSIS — J9620 Acute and chronic respiratory failure, unspecified whether with hypoxia or hypercapnia: Secondary | ICD-10-CM | POA: Diagnosis not present

## 2023-01-02 ENCOUNTER — Encounter (HOSPITAL_COMMUNITY): Payer: Self-pay | Admitting: Emergency Medicine

## 2023-01-02 ENCOUNTER — Other Ambulatory Visit: Payer: Self-pay

## 2023-01-02 ENCOUNTER — Emergency Department (HOSPITAL_COMMUNITY)
Admission: EM | Admit: 2023-01-02 | Discharge: 2023-01-02 | Disposition: A | Discharge status: Home / Self Care | Payer: 59 | Attending: Emergency Medicine | Admitting: Emergency Medicine

## 2023-01-02 DIAGNOSIS — S51811A Laceration without foreign body of right forearm, initial encounter: Secondary | ICD-10-CM | POA: Insufficient documentation

## 2023-01-02 DIAGNOSIS — S50811A Abrasion of right forearm, initial encounter: Secondary | ICD-10-CM | POA: Diagnosis not present

## 2023-01-02 DIAGNOSIS — T148XXA Other injury of unspecified body region, initial encounter: Secondary | ICD-10-CM

## 2023-01-02 DIAGNOSIS — W5503XA Scratched by cat, initial encounter: Secondary | ICD-10-CM | POA: Diagnosis not present

## 2023-01-02 DIAGNOSIS — S51851A Open bite of right forearm, initial encounter: Secondary | ICD-10-CM | POA: Diagnosis not present

## 2023-01-02 DIAGNOSIS — E119 Type 2 diabetes mellitus without complications: Secondary | ICD-10-CM | POA: Insufficient documentation

## 2023-01-02 MED ORDER — TETANUS-DIPHTH-ACELL PERTUSSIS 5-2.5-18.5 LF-MCG/0.5 IM SUSY: 0.5000 mL | PREFILLED_SYRINGE | Freq: Once | INTRAMUSCULAR | Status: DC

## 2023-01-02 MED ORDER — AZITHROMYCIN 250 MG PO TABS: 250.0000 mg | ORAL_TABLET | Freq: Every day | ORAL | 0 refills | Status: DC

## 2023-01-02 NOTE — ED Provider Notes (Signed)
[place] EMERGENCY DEPARTMENT AT [facility] Provider Note   CSN: [id number] Arrival date & time: 01/02/23  1527     History  Chief Complaint  Patient presents with   Animal Bite    Kitten    Gina Bradley is a 62 y.o. female, history of diabetes who presents to the ED secondary to a cat scratch to the right arm, that occurred a couple hours ago.  She states she was playing with her 68-month-old kitten, when he scratched her on the right forearm.  Gina Bradley is not up-to-date on his immunizations.  Last tetanus shot was 10+ years ago  Allergies    Bee venom and Tdap [tetanus-diphth-acell pertussis]    Review of Systems   Review of Systems  Constitutional:  Negative for fever.  Skin:  Positive for wound.    Physical Exam Updated Vital Signs BP 110/67 (BP Location: Left Arm)   Pulse 71   Temp 99.3 F (37.4 C) (Oral)   Resp 18   Ht 5\' 1"  (1.549 m)   Wt 95.3 kg   LMP 01/05/2015   SpO2 97%   BMI 39.68 kg/m  Physical Exam Vitals and nursing note reviewed.  Constitutional:      General: She is not in acute distress.    Appearance: She is well-developed.  HENT:     Head: Normocephalic and atraumatic.  Eyes:     Conjunctiva/sclera: Conjunctivae normal.  Cardiovascular:     Rate and Rhythm: Normal rate and regular rhythm.     Heart sounds: No murmur heard. Pulmonary:     Effort: Pulmonary effort is normal. No respiratory distress.     Breath sounds: Normal breath sounds.  Abdominal:     Palpations: Abdomen is soft.     Tenderness: There is no abdominal tenderness.  Musculoskeletal:        General: No swelling.     Cervical back: Neck supple.  Skin:    General: Skin is warm and dry.     Capillary Refill: Capillary refill takes less than 2 seconds.     Comments: 2.5 cm skin tear to R forearm  Neurological:     Mental Status: She is alert.  Psychiatric:        Mood and Affect: Mood normal.     ED Results / Procedures / Treatments   Labs (all labs  ordered are listed, but only abnormal results are displayed) Labs Reviewed - No data to display  EKG None  Radiology No results found.  Procedures .Marland KitchenLaceration Repair  Date/Time: 01/02/2023 6:07 PM  Performed by: Pete Pelt, PA Authorized by: Pete Pelt, PA   Consent:    Consent obtained:  Verbal   Consent given by:  Patient   Risks, benefits, and alternatives were discussed: yes     Risks discussed:  Infection, need for additional repair, nerve damage, poor wound healing, poor cosmetic result, pain, retained foreign body, tendon damage and vascular damage   Alternatives discussed:  No treatment Universal protocol:    Patient identity confirmed:  Verbally with patient Anesthesia:    Anesthesia method:  None Laceration details:    Location:  Shoulder/arm   Shoulder/arm location:  R lower arm   Length (cm):  2.5 Treatment:    Area cleansed with:  Chlorhexidine   Amount of cleaning:  Standard   Irrigation solution:  Sterile saline   Irrigation method:  Pressure wash Skin repair:    Repair method:  Steri-Strips   Number  of Steri-Strips:  4 Approximation:    Approximation:  Close Post-procedure details:    Dressing:  Bulky dressing   Procedure completion:  Tolerated     Medications Ordered in ED Medications - No data to display   ED Course/ Medical Decision Making/ A&P                                 Medical Decision Making Patient is a 63 year old female, who got scratched by her cat.  Cat is not up-to-date on immunizations.  Discussed with patient, rabies, patient was not bit, she declines vaccinations.  Given that she is not bit, at low risk.  Additionally we discussed tetanus, she states she had an adverse reaction to the tetanus vaccine, a few years back, and declines it at this time.  She voiced understanding of risk associated with this.  I cleansed wound, and repaired with Steri-Strips.  The wound is fairly superficial, and she was discharged with a  Z-Pak, in case it becomes infected, or she develops lymph node swelling.  We discussed return precautions and she voiced understanding discharged home  Risk Prescription drug management.    Final Clinical Impression(s) / ED Diagnoses Final diagnoses:  Cat scratch  Skin abrasion    Rx / DC Orders ED Discharge Orders          Ordered    azithromycin (ZITHROMAX) 250 MG tablet  Daily        01/02/23 1808              Pete Pelt, PA 01/02/23 1810    Vanetta Mulders, MD 01/03/23 0011

## 2023-01-02 NOTE — ED Triage Notes (Signed)
Pt was scratch on right forearm by kitten (6 months old), laceration noted, bleeding controlled, no vaccinations per pt.

## 2023-01-02 NOTE — ED Notes (Signed)
Introduced self to pt Pt stated that her kitten scratched arm Wound covered and seen by provider Waiting on further orders

## 2023-01-02 NOTE — Discharge Instructions (Addendum)
Please follow-up with your primary care doctor, I have prescribed you medication, to take, if the area becomes red, swollen, or you develop lymph node swelling.  Return to the ER if you feel like your symptoms are worsening.  If approved repaired it, with some tape, the tape will fall off on its own

## 2023-01-04 DIAGNOSIS — E1140 Type 2 diabetes mellitus with diabetic neuropathy, unspecified: Secondary | ICD-10-CM | POA: Diagnosis not present

## 2023-01-04 DIAGNOSIS — E1165 Type 2 diabetes mellitus with hyperglycemia: Secondary | ICD-10-CM | POA: Diagnosis not present

## 2023-01-04 DIAGNOSIS — J440 Chronic obstructive pulmonary disease with acute lower respiratory infection: Secondary | ICD-10-CM | POA: Diagnosis not present

## 2023-01-04 DIAGNOSIS — E2749 Other adrenocortical insufficiency: Secondary | ICD-10-CM | POA: Diagnosis not present

## 2023-01-04 DIAGNOSIS — I5032 Chronic diastolic (congestive) heart failure: Secondary | ICD-10-CM | POA: Diagnosis not present

## 2023-01-04 DIAGNOSIS — W5503XA Scratched by cat, initial encounter: Secondary | ICD-10-CM | POA: Diagnosis not present

## 2023-01-27 DIAGNOSIS — J9620 Acute and chronic respiratory failure, unspecified whether with hypoxia or hypercapnia: Secondary | ICD-10-CM | POA: Diagnosis not present

## 2023-02-02 ENCOUNTER — Other Ambulatory Visit (HOSPITAL_COMMUNITY): Payer: Self-pay | Admitting: Family Medicine

## 2023-02-02 ENCOUNTER — Ambulatory Visit (INDEPENDENT_AMBULATORY_CARE_PROVIDER_SITE_OTHER): Payer: 59 | Admitting: Cardiology

## 2023-02-02 ENCOUNTER — Encounter: Payer: Self-pay | Admitting: Cardiology

## 2023-02-02 ENCOUNTER — Ambulatory Visit
Admission: RE | Admit: 2023-02-02 | Discharge: 2023-02-02 | Disposition: A | Discharge status: Home / Self Care | Payer: 59 | Source: Ambulatory Visit | Attending: Family Medicine | Admitting: Family Medicine

## 2023-02-02 VITALS — BP 132/82 | HR 66 | Ht 61.0 in | Wt 216.0 lb

## 2023-02-02 DIAGNOSIS — I471 Supraventricular tachycardia, unspecified: Secondary | ICD-10-CM

## 2023-02-02 DIAGNOSIS — I10 Essential (primary) hypertension: Secondary | ICD-10-CM

## 2023-02-02 DIAGNOSIS — R52 Pain, unspecified: Secondary | ICD-10-CM

## 2023-02-02 DIAGNOSIS — I700 Atherosclerosis of aorta: Secondary | ICD-10-CM | POA: Diagnosis not present

## 2023-02-02 DIAGNOSIS — I5032 Chronic diastolic (congestive) heart failure: Secondary | ICD-10-CM | POA: Insufficient documentation

## 2023-02-02 DIAGNOSIS — R0781 Pleurodynia: Secondary | ICD-10-CM | POA: Diagnosis not present

## 2023-02-02 NOTE — Progress Notes (Signed)
Cardiology Office Note  Date: 02/02/2023   ID: Gina Bradley, DOB 03-21-1961, MRN [id number]  History of Present Illness: Gina Bradley is a 62 y.o. female last seen in March 2022 by Ms. Lilla Shook, I reviewed the note (our last encounter was in 2021).  She is here overdue for follow-up.  Reports no progressive palpitations, no syncope.  States that she continues on Toprol-XL 100 mg daily.  I reviewed her medications she is also on low-dose aspirin and daily Lasix.  She uses below the knee compression stockings daily and has a mechanical compression device that she uses 3 times a week for treatment of lymphedema.  ECG shows normal sinus rhythm.  Physical Exam: VS:  BP (!) 142/82   Pulse 66   Ht 5\' 1"  (1.549 m)   Wt 216 lb (98 kg)   LMP 01/05/2015   SpO2 97%   BMI 40.81 kg/m , BMI Body mass index is 40.81 kg/m.  Wt Readings from Last 3 Encounters:  02/02/23 216 lb (98 kg)  01/02/23 210 lb (95.3 kg)  11/12/21 200 lb (90.7 kg)    General: Patient appears comfortable at rest. HEENT: Conjunctiva and lids normal. Neck: Supple, no elevated JVP or carotid bruits. Lungs: Decreased breath sounds without wheezing. Cardiac: Regular rate and rhythm, no S3 or significant systolic murmur, no pericardial rub. Extremities: Bilateral below the knee compression stockings in place.  ECG:  An ECG dated 07/31/2021 was personally reviewed today and demonstrated:  Sinus rhythm with nonspecific ST changes.  Labwork:  June 2024: Hemoglobin 13.6, platelets 255, BUN 21, creatinine 0.83, potassium 3.9, AST 14, ALT 14  Other Studies Reviewed Today:  Echocardiogram 06/26/2021:  1. Left ventricular ejection fraction, by estimation, is 65 to 70%. The  left ventricle has normal function. The left ventricle has no regional  wall motion abnormalities. Left ventricular diastolic parameters are  indeterminate.   2. Right ventricular systolic function is normal. The right ventricular  size is  normal. Tricuspid regurgitation signal is inadequate for assessing  PA pressure.   3. The mitral valve is normal in structure. No evidence of mitral valve  regurgitation.   4. The aortic valve was not well visualized. Aortic valve regurgitation  is not visualized. No aortic stenosis is present.   Assessment and Plan:  1.  PSVT.  No progressive symptomatology, plan to continue Toprol-XL 100 mg daily and observation.  2.  Primary hypertension.  Blood pressure up today.  By current list she is only on Toprol XL, recommended follow-up with PCP in case additional therapy becomes necessary.  3.  Lymphedema.  She uses below the knee compression stockings and also a mechanical compression device at home.  4.  COPD.  Keep follow-up with pulmonary.  Disposition:  Follow up  1 year.  Signed, Jonelle Sidle, M.D., F.A.C.C. [facility]

## 2023-02-02 NOTE — Patient Instructions (Signed)
Medication Instructions:  Your physician recommends that you continue on your current medications as directed. Please refer to the Current Medication list given to you today.   Labwork: None today  Testing/Procedures: None today  Follow-Up: 1 year  Any Other Special Instructions Will Be Listed Below (If Applicable).  If you need a refill on your cardiac medications before your next appointment, please call your pharmacy.  

## 2023-02-10 DIAGNOSIS — G894 Chronic pain syndrome: Secondary | ICD-10-CM | POA: Diagnosis not present

## 2023-02-10 DIAGNOSIS — L0889 Other specified local infections of the skin and subcutaneous tissue: Secondary | ICD-10-CM | POA: Diagnosis not present

## 2023-02-10 DIAGNOSIS — W548XXA Other contact with dog, initial encounter: Secondary | ICD-10-CM | POA: Diagnosis not present

## 2023-02-16 ENCOUNTER — Other Ambulatory Visit: Payer: Self-pay | Admitting: Internal Medicine

## 2023-02-16 DIAGNOSIS — Z1231 Encounter for screening mammogram for malignant neoplasm of breast: Secondary | ICD-10-CM

## 2023-02-20 DIAGNOSIS — C44391 Other specified malignant neoplasm of skin of nose: Secondary | ICD-10-CM | POA: Diagnosis not present

## 2023-02-27 DIAGNOSIS — J9620 Acute and chronic respiratory failure, unspecified whether with hypoxia or hypercapnia: Secondary | ICD-10-CM | POA: Diagnosis not present

## 2023-03-01 ENCOUNTER — Telehealth: Payer: Self-pay | Admitting: Cardiology

## 2023-03-01 MED ORDER — METOPROLOL SUCCINATE ER 100 MG PO TB24: 100.0000 mg | ORAL_TABLET | Freq: Every day | ORAL | 0 refills | Status: AC

## 2023-03-01 NOTE — Telephone Encounter (Signed)
Clarified medications with patient.

## 2023-03-01 NOTE — Telephone Encounter (Signed)
Patient wants a call back to clarify her blood pressure medications.

## 2023-03-11 NOTE — Progress Notes (Unsigned)
Gina Bradley, female    DOB: 1961-03-11     MRN: [id number]   Brief patient profile:  62 yowf quit smoking 01/2019 carries dx of copd but as of 12/2015 had mostly upper airway obst pattern on pfts  Then admit:     Admit date: 06/26/2019 Discharge date: 07/04/2019   Recommendations for Outpatient Follow-up:  Repeat basic metabolic panel to follow electrolytes and renal function Reassess blood pressure and further adjust antihypertensive regimen as needed.     Discharge Diagnoses:  Principal Problem:   AMS (altered mental status) Active Problems:   HTN (hypertension), benign   Hypercholesteremia   COPD (chronic obstructive pulmonary disease) (HCC)   Cervical stenosis of spine   Peripheral neuropathy   GERD (gastroesophageal reflux disease)   Acidosis, metabolic   Acute psychosis (HCC)   Hypokalemia   Class 1 obesity due to excess calories with body mass index (BMI) of 33.0 to 33.9 in adult   Cellulitis of lower extremity     Discharge Condition: Stable and improved.  Patient discharged home with instruction to follow-up with PCP and psychiatry service as an outpatient.   CODE STATUS: Full code.   Diet recommendation: Heart healthy diet.        Filed Weights    07/01/19 0500 07/02/19 0639 07/04/19 0700  Weight: 82.5 kg 83.8 kg 85.9 kg      History of present illness:  As per H&P written by [name] on 06/27/19 62 y.o. female with medical history significant of COPD, hypertension, hyperlipidemia, peripheral neuropathy who presented to the ER with altered mental status. Patient presented with increased confusion, hallucinations, psychotic behavior.  Patient was brought in yelling and screaming and saying that she is hearing voices.  At the time of my interview she refers to herself in the third person and was referring to all her symptoms as if they were affecting somebody else.  Also seem to have some delusions, confusion and hallucinations.  Possibly acute psychosis  with schizophrenia.  Does not seem to have a prior history of schizophrenia but admits to me that she has been diagnosed with "schizoid".  Patient does not really give a relevant history at this time. ED Course:  Vital Signs reviewed on presentation, significant for temperature 98.4, heart rate 106, blood pressure 109/75, saturation 100% on room air. Labs reviewed, significant for arterial blood gas shows a pH of 7.43, PCO2 24, PO2 102, saturation 97%.  Sodium 136, potassium 3.8, bicarb 16, anion gap 20, BUN 11, creatinine 0.59, ALT 52, total bilirubin 1.4, ammonia 34, lactic acid 1.0, WBC count 14.3, hemoglobin 15, hematocrit 46, platelets 272, TSH 1.123. Imaging personally Reviewed, chest x-ray shows no acute cardiopulmonary disease.  CT of the head shows no acute intracranial abnormalities. EKG personally reviewed, shows sinus tachycardia, no acute ST-T changes.   Hospital Course:  1-acute hepatic encephalopathy: Presented with increased confusion, hallucinations and psychotic behavior. -Following psychiatry recommendations patient has been started on Zyprexa twice a day (2.5 mg in the morning and 5 mg at bedtime), in order to achieve mood stabilization and prevent psychosis. -Has been cleared by psychiatry service to go home and follow-up as an outpatient -Currently hemodynamically stable, denying hallucinations or suicidal ideation. -She is oriented x3 and demonstrating normal insight.   2-hypokalemia -Potassium 2.8 on presentation -After repletion/initiation of maintenance supplementation potassium within normal limits -Magnesium also stable. -Repeat basic metabolic panel follow-up visit to reassess electrolytes trend and the stability.   3-hypertension -Continue metoprolol and heart  healthy diet.[name]   4-hyperlipidemia -Continue Lipitor.   5-bilateral lower extremity cellulitis/left lower extremity open wound -No signs of active drainage -Significant improvement in erythematous  changes -No fever and normal WBCs -Continue treatment with oral doxycycline to complete antibiotic therapy. -Patient advised to maintain low-sodium diet and to keep her legs elevated.    6-chronic diastolic congestive heart failure: -Compensated overall -Continue blood pressure control, continue treatment with isosorbide and as needed home Lasix dosage. -Heart healthy diet and daily weight has been instructed.   7-peripheral neuropathy -Continue gabapentin -Dose will be adjusted for further control of her neuropathic pain. -Instructed to take 200 mg 3 times a day at discharge; recommending close monitoring by PCP with further adjustment to medication as needed.   8-chronic obstructive pulmonary disease -Stable and compensated -No wheezing on exam. -No requiring oxygen supplementation and no wheezing on exam. -Continue as needed albuterol and continue Anoro Ellipta.   9-gastroesophageal reflux disease -Continue PPI.   10-class I obesity -Low calorie diet, portion control increase physical activity discussed with patient -Body mass index is 33.55 kg/m.        History of Present Illness  10/22/2019  Pulmonary/ 1st office eval/[name]  Quit smoking 01/2019/ maint on anoro and pred 10 mg daily x "2 y"   Chief Complaint  Patient presents with   Consult    Patient has COPD. Patient is doing better than last summer. When it is really hot or really cold it makes her breathing worse. Dry cough except in the morning with grey sputum. Shortness of breath with exeriton.  Dyspnea:  mb and back 100-150 ft stops a time or two  Pain in back/hips/ knees also limiting  Cough: not much at all  Sleep: flat bed two pillows, needs "inhalers first thing in am" with assoc neck tightness on ppi q am only  SABA use: once or twice daily depending on activity - never  Re-challenges or prechallenges Rec Increase Pantoprazole 40 mg Take 30- 60 min before your first and last meals of the day  GERD diet  reviewed, bed blocks rec  Make sure you check your oxygen saturations at highest level of activity to be sure it stays over 90%  Try wean prednisone :  One week of 10/5 then 5 mg daily x one week then 2.5 mg daily and increase if needed back to prior effective  Change toprol  to where you take a half pill twice daily  Plan A = Automatic = Always=    Anoro one click each am  Plan B = Backup (to supplement plan A, not to replace it) Only use your albuterol inhaler as a rescue medication Plan C = Crisis (instead of Plan B but only if Plan B stops working) - only use your albuterol nebulizer if you first try Plan B  Plan D = Deltasone = prednisone  -  Take 2 daily until better then wean back to floor 2.5 mg    Please schedule a follow up visit in 3 months but call sooner if needed         03/13/2023  Re-establish  ov/[place] office/Hadleigh Felber re: GOLD 0 copd  maint on ***  No chief complaint on file.   Dyspnea:  *** Cough: *** Sleeping: ***   resp cc  SABA use: *** 02: ***  Lung cancer screening: ***   No obvious day to day or daytime variability or assoc excess/ purulent sputum or mucus plugs or hemoptysis or cp or chest  tightness, subjective wheeze or overt sinus or hb symptoms.    Also denies any obvious fluctuation of symptoms with weather or environmental changes or other aggravating or alleviating factors except as outlined above   No unusual exposure hx or h/o childhood pna/ asthma or knowledge of premature birth.  Current Allergies, Complete Past Medical History, Past Surgical History, Family History, and Social History were reviewed in [employer] record.  ROS  The following are not active complaints unless bolded Hoarseness, sore throat, dysphagia, dental problems, itching, sneezing,  nasal congestion or discharge of excess mucus or purulent secretions, ear ache,   fever, chills, sweats, unintended wt loss or wt gain, classically pleuritic or exertional  cp,  orthopnea pnd or arm/hand swelling  or leg swelling, presyncope, palpitations, abdominal pain, anorexia, nausea, vomiting, diarrhea  or change in bowel habits or change in bladder habits, change in stools or change in urine, dysuria, hematuria,  rash, arthralgias, visual complaints, headache, numbness, weakness or ataxia or problems with walking or coordination,  change in mood or  memory.        No outpatient medications have been marked as taking for the 03/13/23 encounter (Appointment) with Nyoka Cowden, MD.        Past Medical History:  Diagnosis Date   Anemia    Cervical disc disease    COPD (chronic obstructive pulmonary disease) (HCC)    Diabetes mellitus without complication (HCC)    Essential hypertension    GERD (gastroesophageal reflux disease)    Heart failure (HCC) 2018   History of cardiac catheterization    Normal coronaries March 2015   History of non-ST elevation myocardial infarction (NSTEMI)    Secondary to SVT   Lymphedema    Peripheral neuropathy    Spinal stenosis    SVT (supraventricular tachycardia) (HCC)     Outpatient Medications Prior to Visit  Medication Sig Dispense Refill   albuterol (PROVENTIL HFA;VENTOLIN HFA) 108 (90 Base) MCG/ACT inhaler Inhale into the lungs every 6 (six) hours as needed for wheezing or shortness of breath.     albuterol (PROVENTIL) (2.5 MG/3ML) 0.083% nebulizer solution Take 2.5 mg by nebulization 4 (four) times daily.     ANORO ELLIPTA 62.5-25 MCG/INH AEPB Inhale 1 puff into the lungs daily.   11   aspirin EC 81 MG EC tablet Take 1 tablet (81 mg total) by mouth daily.     atorvastatin (LIPITOR) 20 MG tablet Take 20 mg by mouth every evening.      co-enzyme Q-10 30 MG capsule Take 100 mg by mouth daily.     EPINEPHrine (EPIPEN 2-PAK) 0.3 mg/0.3 mL IJ SOAJ injection Inject 0.3 mLs (0.3 mg total) into the muscle once as needed (for severe allergic reaction). CAll 911 immediately if you have to use this medicine 2 Device 1    fluticasone (FLONASE) 50 MCG/ACT nasal spray Place 2 sprays into both nostrils daily.     furosemide (LASIX) 20 MG tablet TAKE (1) TABLET BY MOUTH 2 TIMES DAILY AS NEEDED. 60 tablet 6   gabapentin (NEURONTIN) 100 MG capsule Take 2 capsules (200 mg total) by mouth 3 (three) times daily.     HYDROcodone-acetaminophen (NORCO) 10-325 MG tablet Take 1 tablet by mouth every 6 (six) hours as needed for severe pain. pain     isosorbide mononitrate (ISMO) 10 MG tablet Take 10 mg by mouth daily.     losartan (COZAAR) 25 MG tablet Take 25 mg by mouth daily.  metFORMIN (GLUCOPHAGE) 500 MG tablet Take 500 mg by mouth 2 (two) times daily.   10   metoprolol succinate (TOPROL-XL) 100 MG 24 hr tablet TAKE (1) TABLET BY MOUTH ONCE DAILY. (Patient taking differently: Take 100 mg by mouth daily. ) 30 tablet 9   nitroGLYCERIN (NITROSTAT) 0.4 MG SL tablet Place 1 tablet (0.4 mg total) under the tongue every 5 (five) minutes as needed for chest pain. 10 tablet 0   Oxycodone HCl 10 MG TABS Take 10 mg by mouth 4 (four) times daily as needed.     pantoprazole (PROTONIX) 40 MG tablet Take 40 mg by mouth 2 (two) times daily.     predniSONE (DELTASONE) 5 MG tablet Take 10 mg by mouth 2 (two) times daily.      primidone (MYSOLINE) 50 MG tablet Take 50 mg by mouth at bedtime.      No facility-administered medications prior to visit.     Objective:     Wts   03/13/2023       ***   02/02/23 216 lb (98 kg)  01/02/23 210 lb (95.3 kg)  11/12/21 200 lb (90.7 kg)      Vital signs reviewed  03/13/2023  - Note at rest 02 sats  ***% on ***   General appearance:    ***          Assessment

## 2023-03-13 ENCOUNTER — Institutional Professional Consult (permissible substitution): Payer: 59 | Admitting: Internal Medicine

## 2023-03-13 ENCOUNTER — Encounter: Payer: Self-pay | Admitting: Internal Medicine

## 2023-03-15 DIAGNOSIS — E039 Hypothyroidism, unspecified: Secondary | ICD-10-CM | POA: Diagnosis not present

## 2023-03-15 DIAGNOSIS — D72829 Elevated white blood cell count, unspecified: Secondary | ICD-10-CM | POA: Diagnosis not present

## 2023-03-15 DIAGNOSIS — J449 Chronic obstructive pulmonary disease, unspecified: Secondary | ICD-10-CM | POA: Diagnosis not present

## 2023-03-15 DIAGNOSIS — E559 Vitamin D deficiency, unspecified: Secondary | ICD-10-CM | POA: Diagnosis not present

## 2023-03-15 DIAGNOSIS — E1165 Type 2 diabetes mellitus with hyperglycemia: Secondary | ICD-10-CM | POA: Diagnosis not present

## 2023-03-15 DIAGNOSIS — I110 Hypertensive heart disease with heart failure: Secondary | ICD-10-CM | POA: Diagnosis not present

## 2023-03-15 DIAGNOSIS — I10 Essential (primary) hypertension: Secondary | ICD-10-CM | POA: Diagnosis not present

## 2023-03-27 ENCOUNTER — Telehealth: Payer: Self-pay | Admitting: Internal Medicine

## 2023-03-27 NOTE — Telephone Encounter (Signed)
Spoke with patient regarding new appointment date and time with Dr. Sherene Sires on 05/10/23 at 2:30 pm----will mail information to patient and she voiced her understanding.

## 2023-03-29 DIAGNOSIS — J9620 Acute and chronic respiratory failure, unspecified whether with hypoxia or hypercapnia: Secondary | ICD-10-CM | POA: Diagnosis not present

## 2023-04-13 ENCOUNTER — Institutional Professional Consult (permissible substitution): Payer: 59 | Admitting: Internal Medicine

## 2023-04-13 DIAGNOSIS — D518 Other vitamin B12 deficiency anemias: Secondary | ICD-10-CM | POA: Diagnosis not present

## 2023-04-13 DIAGNOSIS — E559 Vitamin D deficiency, unspecified: Secondary | ICD-10-CM | POA: Diagnosis not present

## 2023-04-13 DIAGNOSIS — E2749 Other adrenocortical insufficiency: Secondary | ICD-10-CM | POA: Diagnosis not present

## 2023-04-13 DIAGNOSIS — I471 Supraventricular tachycardia, unspecified: Secondary | ICD-10-CM | POA: Diagnosis not present

## 2023-04-13 DIAGNOSIS — R3129 Other microscopic hematuria: Secondary | ICD-10-CM | POA: Diagnosis not present

## 2023-04-13 DIAGNOSIS — E1165 Type 2 diabetes mellitus with hyperglycemia: Secondary | ICD-10-CM | POA: Diagnosis not present

## 2023-04-13 DIAGNOSIS — I5032 Chronic diastolic (congestive) heart failure: Secondary | ICD-10-CM | POA: Diagnosis not present

## 2023-04-13 DIAGNOSIS — E039 Hypothyroidism, unspecified: Secondary | ICD-10-CM | POA: Diagnosis not present

## 2023-04-13 DIAGNOSIS — E0590 Thyrotoxicosis, unspecified without thyrotoxic crisis or storm: Secondary | ICD-10-CM | POA: Diagnosis not present

## 2023-04-13 DIAGNOSIS — I700 Atherosclerosis of aorta: Secondary | ICD-10-CM | POA: Diagnosis not present

## 2023-04-13 DIAGNOSIS — Z0001 Encounter for general adult medical examination with abnormal findings: Secondary | ICD-10-CM | POA: Diagnosis not present

## 2023-04-13 DIAGNOSIS — M545 Low back pain, unspecified: Secondary | ICD-10-CM | POA: Diagnosis not present

## 2023-04-29 DIAGNOSIS — J9620 Acute and chronic respiratory failure, unspecified whether with hypoxia or hypercapnia: Secondary | ICD-10-CM | POA: Diagnosis not present

## 2023-05-09 NOTE — Progress Notes (Deleted)
 Gina Bradley, female    DOB: Aug 07, 1960     MRN: [id number]   Brief patient profile:  59 yowf quit smoking 01/2019 carries dx of copd but as of 12/2015 had mostly upper airway obst pattern on pfts  Then admit:     Admit date: 06/26/2019 Discharge date: 07/04/2019   Recommendations for Outpatient Follow-up:  Repeat basic metabolic panel to follow electrolytes and renal function Reassess blood pressure and further adjust antihypertensive regimen as needed.     Discharge Diagnoses:  Principal Problem:   AMS (altered mental status) Active Problems:   HTN (hypertension), benign   Hypercholesteremia   COPD (chronic obstructive pulmonary disease) (HCC)   Cervical stenosis of spine   Peripheral neuropathy   GERD (gastroesophageal reflux disease)   Acidosis, metabolic   Acute psychosis (HCC)   Hypokalemia   Class 1 obesity due to excess calories with body mass index (BMI) of 33.0 to 33.9 in adult   Cellulitis of lower extremity     Discharge Condition: Stable and improved.  Patient discharged home with instruction to follow-up with PCP and psychiatry service as an outpatient.   CODE STATUS: Full code.   Diet recommendation: Heart healthy diet.        Filed Weights    07/01/19 0500 07/02/19 0639 07/04/19 0700  Weight: 82.5 kg 83.8 kg 85.9 kg      History of present illness:  As per H&P written by Dr. Margart on 06/27/19 63 y.o. female with medical history significant of COPD, hypertension, hyperlipidemia, peripheral neuropathy who presented to the ER with altered mental status. Patient presented with increased confusion, hallucinations, psychotic behavior.  Patient was brought in yelling and screaming and saying that she is hearing voices.  At the time of my interview she refers to herself in the third person and was referring to all her symptoms as if they were affecting somebody else.  Also seem to have some delusions, confusion and hallucinations.  Possibly acute psychosis  with schizophrenia.  Does not seem to have a prior history of schizophrenia but admits to me that she has been diagnosed with schizoid.  Patient does not really give a relevant history at this time. ED Course:  Vital Signs reviewed on presentation, significant for temperature 98.4, heart rate 106, blood pressure 109/75, saturation 100% on room air. Labs reviewed, significant for arterial blood gas shows a pH of 7.43, PCO2 24, PO2 102, saturation 97%.  Sodium 136, potassium 3.8, bicarb 16, anion gap 20, BUN 11, creatinine 0.59, ALT 52, total bilirubin 1.4, ammonia 34, lactic acid 1.0, WBC count 14.3, hemoglobin 15, hematocrit 46, platelets 272, TSH 1.123. Imaging personally Reviewed, chest x-ray shows no acute cardiopulmonary disease.  CT of the head shows no acute intracranial abnormalities. EKG personally reviewed, shows sinus tachycardia, no acute ST-T changes.   Hospital Course:  1-acute hepatic encephalopathy: Presented with increased confusion, hallucinations and psychotic behavior. -Following psychiatry recommendations patient has been started on Zyprexa  twice a day (2.5 mg in the morning and 5 mg at bedtime), in order to achieve mood stabilization and prevent psychosis. -Has been cleared by psychiatry service to go home and follow-up as an outpatient -Currently hemodynamically stable, denying hallucinations or suicidal ideation. -She is oriented x3 and demonstrating normal insight.   2-hypokalemia -Potassium 2.8 on presentation -After repletion/initiation of maintenance supplementation potassium within normal limits -Magnesium  also stable. -Repeat basic metabolic panel follow-up visit to reassess electrolytes trend and the stability.   3-hypertension -Continue metoprolol  and heart  healthy diet.SABRA   4-hyperlipidemia -Continue Lipitor .   5-bilateral lower extremity cellulitis/left lower extremity open wound -No signs of active drainage -Significant improvement in erythematous  changes -No fever and normal WBCs -Continue treatment with oral doxycycline  to complete antibiotic therapy. -Patient advised to maintain low-sodium diet and to keep her legs elevated.    6-chronic diastolic congestive heart failure: -Compensated overall -Continue blood pressure control, continue treatment with isosorbide  and as needed home Lasix  dosage. -Heart healthy diet and daily weight has been instructed.   7-peripheral neuropathy -Continue gabapentin  -Dose will be adjusted for further control of her neuropathic pain. -Instructed to take 200 mg 3 times a day at discharge; recommending close monitoring by PCP with further adjustment to medication as needed.   8-chronic obstructive pulmonary disease -Stable and compensated -No wheezing on exam. -No requiring oxygen  supplementation and no wheezing on exam. -Continue as needed albuterol  and continue Anoro Ellipta .   9-gastroesophageal reflux disease -Continue PPI.   10-class I obesity -Low calorie diet, portion control increase physical activity discussed with patient -Body mass index is 33.55 kg/m.        History of Present Illness  10/22/2019  Pulmonary/ 1st office eval/Gina Bradley  Quit smoking 01/2019/ maint on anoro and pred 10 mg daily x 2 y   Chief Complaint  Patient presents with   Consult    Patient has COPD. Patient is doing better than last summer. When it is really hot or really cold it makes her breathing worse. Dry cough except in the morning with grey sputum. Shortness of breath with exeriton.  Dyspnea:  mb and back 100-150 ft stops a time or two  Pain in back/hips/ knees also limiting  Cough: not much at all  Sleep: flat bed two pillows, needs inhalers first thing in am with assoc neck tightness on ppi q am only  SABA use: once or twice daily depending on activity - never  Re-challenges or prechallenges Rec Increase Pantoprazole  40 mg Take 30- 60 min before your first and last meals of the day  GERD diet  reviewed, bed blocks rec  Make sure you check your oxygen  saturations at highest level of activity to be sure it stays over 90%  Try wean prednisone  :  One week of 10/5 then 5 mg daily x one week then 2.5 mg daily and increase if needed back to prior effective  Change toprol   to where you take a half pill twice daily  Plan A = Automatic = Always=    Anoro one click each am  Plan B = Backup (to supplement plan A, not to replace it) Only use your albuterol  inhaler as a rescue medication Plan C = Crisis (instead of Plan B but only if Plan B stops working) - only use your albuterol  nebulizer if you first try Plan B  Plan D = Deltasone  = prednisone   -  Take 2 daily until better then wean back to floor 2.5 mg   Please schedule a follow up visit in 3 months but call sooner if needed  > did not return as rec   05/10/2023  Re-establish  ov/[place] office/Elianny Buxbaum re: *** maint on ***  No chief complaint on file.   Dyspnea:  *** Cough: *** Sleeping: ***   resp cc  SABA use: *** 02: ***  Lung cancer screening: ***   No obvious day to day or daytime variability or assoc excess/ purulent sputum or mucus plugs or hemoptysis or cp or chest tightness, subjective wheeze  or overt sinus or hb symptoms.    Also denies any obvious fluctuation of symptoms with weather or environmental changes or other aggravating or alleviating factors except as outlined above   No unusual exposure hx or h/o childhood pna/ asthma or knowledge of premature birth.  Current Allergies, Complete Past Medical History, Past Surgical History, Family History, and Social History were reviewed in [employer] record.  ROS  The following are not active complaints unless bolded Hoarseness, sore throat, dysphagia, dental problems, itching, sneezing,  nasal congestion or discharge of excess mucus or purulent secretions, ear ache,   fever, chills, sweats, unintended wt loss or wt gain, classically pleuritic or  exertional cp,  orthopnea pnd or arm/hand swelling  or leg swelling, presyncope, palpitations, abdominal pain, anorexia, nausea, vomiting, diarrhea  or change in bowel habits or change in bladder habits, change in stools or change in urine, dysuria, hematuria,  rash, arthralgias, visual complaints, headache, numbness, weakness or ataxia or problems with walking or coordination,  change in mood or  memory.        No outpatient medications have been marked as taking for the 05/10/23 encounter (Appointment) with Darlean Ozell NOVAK, MD.               Past Medical History:  Diagnosis Date   Anemia    Cervical disc disease    COPD (chronic obstructive pulmonary disease) (HCC)    Diabetes mellitus without complication (HCC)    Essential hypertension    GERD (gastroesophageal reflux disease)    Heart failure (HCC) 2018   History of cardiac catheterization    Normal coronaries March 2015   History of non-ST elevation myocardial infarction (NSTEMI)    Secondary to SVT   Lymphedema    Peripheral neuropathy    Spinal stenosis    SVT (supraventricular tachycardia) (HCC)        Objective:    Wts  05/10/2023         ***  02/02/23 216 lb (98 kg)  01/02/23 210 lb (95.3 kg)  11/12/21 200 lb (90.7 kg)      Vital signs reviewed  05/10/2023  - Note at rest 02 sats  ***% on ***   General appearance:    ***              Assessment

## 2023-05-10 ENCOUNTER — Encounter: Payer: Self-pay | Admitting: Internal Medicine

## 2023-05-10 ENCOUNTER — Institutional Professional Consult (permissible substitution): Payer: 59 | Admitting: Internal Medicine

## 2023-05-12 DIAGNOSIS — J440 Chronic obstructive pulmonary disease with acute lower respiratory infection: Secondary | ICD-10-CM | POA: Diagnosis not present

## 2023-05-12 DIAGNOSIS — M545 Low back pain, unspecified: Secondary | ICD-10-CM | POA: Diagnosis not present

## 2023-05-12 DIAGNOSIS — E1165 Type 2 diabetes mellitus with hyperglycemia: Secondary | ICD-10-CM | POA: Diagnosis not present

## 2023-05-12 DIAGNOSIS — I700 Atherosclerosis of aorta: Secondary | ICD-10-CM | POA: Diagnosis not present

## 2023-05-12 DIAGNOSIS — E2749 Other adrenocortical insufficiency: Secondary | ICD-10-CM | POA: Diagnosis not present

## 2023-05-12 DIAGNOSIS — I5032 Chronic diastolic (congestive) heart failure: Secondary | ICD-10-CM | POA: Diagnosis not present

## 2023-05-12 DIAGNOSIS — E1140 Type 2 diabetes mellitus with diabetic neuropathy, unspecified: Secondary | ICD-10-CM | POA: Diagnosis not present

## 2023-05-12 DIAGNOSIS — M5416 Radiculopathy, lumbar region: Secondary | ICD-10-CM | POA: Diagnosis not present

## 2023-05-30 DIAGNOSIS — J9620 Acute and chronic respiratory failure, unspecified whether with hypoxia or hypercapnia: Secondary | ICD-10-CM | POA: Diagnosis not present

## 2023-06-09 DIAGNOSIS — I5032 Chronic diastolic (congestive) heart failure: Secondary | ICD-10-CM | POA: Diagnosis not present

## 2023-06-09 DIAGNOSIS — E1140 Type 2 diabetes mellitus with diabetic neuropathy, unspecified: Secondary | ICD-10-CM | POA: Diagnosis not present

## 2023-06-09 DIAGNOSIS — G894 Chronic pain syndrome: Secondary | ICD-10-CM | POA: Diagnosis not present

## 2023-06-09 DIAGNOSIS — M47816 Spondylosis without myelopathy or radiculopathy, lumbar region: Secondary | ICD-10-CM | POA: Diagnosis not present

## 2023-06-09 DIAGNOSIS — I700 Atherosclerosis of aorta: Secondary | ICD-10-CM | POA: Diagnosis not present

## 2023-06-09 DIAGNOSIS — I10 Essential (primary) hypertension: Secondary | ICD-10-CM | POA: Diagnosis not present

## 2023-06-09 DIAGNOSIS — E1165 Type 2 diabetes mellitus with hyperglycemia: Secondary | ICD-10-CM | POA: Diagnosis not present

## 2023-06-09 DIAGNOSIS — M5416 Radiculopathy, lumbar region: Secondary | ICD-10-CM | POA: Diagnosis not present

## 2023-06-09 DIAGNOSIS — J440 Chronic obstructive pulmonary disease with acute lower respiratory infection: Secondary | ICD-10-CM | POA: Diagnosis not present

## 2023-06-16 ENCOUNTER — Encounter

## 2023-06-27 DIAGNOSIS — J9620 Acute and chronic respiratory failure, unspecified whether with hypoxia or hypercapnia: Secondary | ICD-10-CM | POA: Diagnosis not present

## 2023-07-10 DIAGNOSIS — G894 Chronic pain syndrome: Secondary | ICD-10-CM | POA: Diagnosis not present

## 2023-07-10 DIAGNOSIS — E1140 Type 2 diabetes mellitus with diabetic neuropathy, unspecified: Secondary | ICD-10-CM | POA: Diagnosis not present

## 2023-07-10 DIAGNOSIS — E1165 Type 2 diabetes mellitus with hyperglycemia: Secondary | ICD-10-CM | POA: Diagnosis not present

## 2023-07-10 DIAGNOSIS — M47816 Spondylosis without myelopathy or radiculopathy, lumbar region: Secondary | ICD-10-CM | POA: Diagnosis not present

## 2023-07-28 DIAGNOSIS — J9620 Acute and chronic respiratory failure, unspecified whether with hypoxia or hypercapnia: Secondary | ICD-10-CM | POA: Diagnosis not present

## 2023-08-07 DIAGNOSIS — G894 Chronic pain syndrome: Secondary | ICD-10-CM | POA: Diagnosis not present

## 2023-08-27 DIAGNOSIS — J9620 Acute and chronic respiratory failure, unspecified whether with hypoxia or hypercapnia: Secondary | ICD-10-CM | POA: Diagnosis not present

## 2023-09-07 ENCOUNTER — Other Ambulatory Visit (HOSPITAL_COMMUNITY): Payer: Self-pay | Admitting: Internal Medicine

## 2023-09-07 DIAGNOSIS — M47816 Spondylosis without myelopathy or radiculopathy, lumbar region: Secondary | ICD-10-CM | POA: Diagnosis not present

## 2023-09-07 DIAGNOSIS — I5032 Chronic diastolic (congestive) heart failure: Secondary | ICD-10-CM | POA: Diagnosis not present

## 2023-09-07 DIAGNOSIS — R221 Localized swelling, mass and lump, neck: Secondary | ICD-10-CM | POA: Diagnosis not present

## 2023-09-07 DIAGNOSIS — G894 Chronic pain syndrome: Secondary | ICD-10-CM | POA: Diagnosis not present

## 2023-09-07 DIAGNOSIS — M545 Low back pain, unspecified: Secondary | ICD-10-CM | POA: Diagnosis not present

## 2023-09-07 DIAGNOSIS — I10 Essential (primary) hypertension: Secondary | ICD-10-CM | POA: Diagnosis not present

## 2023-09-07 DIAGNOSIS — J440 Chronic obstructive pulmonary disease with acute lower respiratory infection: Secondary | ICD-10-CM | POA: Diagnosis not present

## 2023-09-07 DIAGNOSIS — E2749 Other adrenocortical insufficiency: Secondary | ICD-10-CM | POA: Diagnosis not present

## 2023-09-07 DIAGNOSIS — E1140 Type 2 diabetes mellitus with diabetic neuropathy, unspecified: Secondary | ICD-10-CM | POA: Diagnosis not present

## 2023-09-14 ENCOUNTER — Ambulatory Visit (HOSPITAL_COMMUNITY)
Admission: RE | Admit: 2023-09-14 | Discharge: 2023-09-14 | Disposition: A | Discharge status: Home / Self Care | Source: Ambulatory Visit | Attending: Internal Medicine | Admitting: Internal Medicine

## 2023-09-14 DIAGNOSIS — R221 Localized swelling, mass and lump, neck: Secondary | ICD-10-CM | POA: Diagnosis not present

## 2023-09-27 DIAGNOSIS — J9620 Acute and chronic respiratory failure, unspecified whether with hypoxia or hypercapnia: Secondary | ICD-10-CM | POA: Diagnosis not present

## 2023-10-05 DIAGNOSIS — I5032 Chronic diastolic (congestive) heart failure: Secondary | ICD-10-CM | POA: Diagnosis not present

## 2023-10-05 DIAGNOSIS — E2749 Other adrenocortical insufficiency: Secondary | ICD-10-CM | POA: Diagnosis not present

## 2023-10-05 DIAGNOSIS — G894 Chronic pain syndrome: Secondary | ICD-10-CM | POA: Diagnosis not present

## 2023-10-05 DIAGNOSIS — E1140 Type 2 diabetes mellitus with diabetic neuropathy, unspecified: Secondary | ICD-10-CM | POA: Diagnosis not present

## 2023-10-05 DIAGNOSIS — M47816 Spondylosis without myelopathy or radiculopathy, lumbar region: Secondary | ICD-10-CM | POA: Diagnosis not present

## 2023-10-05 DIAGNOSIS — I10 Essential (primary) hypertension: Secondary | ICD-10-CM | POA: Diagnosis not present

## 2023-10-05 DIAGNOSIS — E1165 Type 2 diabetes mellitus with hyperglycemia: Secondary | ICD-10-CM | POA: Diagnosis not present

## 2023-10-05 DIAGNOSIS — J440 Chronic obstructive pulmonary disease with acute lower respiratory infection: Secondary | ICD-10-CM | POA: Diagnosis not present

## 2023-10-27 DIAGNOSIS — J9620 Acute and chronic respiratory failure, unspecified whether with hypoxia or hypercapnia: Secondary | ICD-10-CM | POA: Diagnosis not present

## 2023-11-02 DIAGNOSIS — J449 Chronic obstructive pulmonary disease, unspecified: Secondary | ICD-10-CM | POA: Diagnosis not present

## 2023-11-02 DIAGNOSIS — I700 Atherosclerosis of aorta: Secondary | ICD-10-CM | POA: Diagnosis not present

## 2023-11-02 DIAGNOSIS — Z87892 Personal history of anaphylaxis: Secondary | ICD-10-CM | POA: Diagnosis not present

## 2023-11-07 DIAGNOSIS — J440 Chronic obstructive pulmonary disease with acute lower respiratory infection: Secondary | ICD-10-CM | POA: Diagnosis not present

## 2023-11-07 DIAGNOSIS — E1140 Type 2 diabetes mellitus with diabetic neuropathy, unspecified: Secondary | ICD-10-CM | POA: Diagnosis not present

## 2023-11-07 DIAGNOSIS — M47816 Spondylosis without myelopathy or radiculopathy, lumbar region: Secondary | ICD-10-CM | POA: Diagnosis not present

## 2023-11-07 DIAGNOSIS — G894 Chronic pain syndrome: Secondary | ICD-10-CM | POA: Diagnosis not present

## 2023-11-07 DIAGNOSIS — E1165 Type 2 diabetes mellitus with hyperglycemia: Secondary | ICD-10-CM | POA: Diagnosis not present

## 2023-11-07 DIAGNOSIS — I5032 Chronic diastolic (congestive) heart failure: Secondary | ICD-10-CM | POA: Diagnosis not present

## 2023-11-07 DIAGNOSIS — N12 Tubulo-interstitial nephritis, not specified as acute or chronic: Secondary | ICD-10-CM | POA: Diagnosis not present

## 2023-11-07 DIAGNOSIS — I10 Essential (primary) hypertension: Secondary | ICD-10-CM | POA: Diagnosis not present

## 2023-11-07 DIAGNOSIS — E2749 Other adrenocortical insufficiency: Secondary | ICD-10-CM | POA: Diagnosis not present

## 2023-11-08 ENCOUNTER — Ambulatory Visit: Payer: Self-pay | Admitting: *Deleted

## 2023-11-08 NOTE — Telephone Encounter (Signed)
 Patient calling to schedule appt with new practice.  Previous provider has retired and other provider in [place] practice leaving. New patient appt scheduled for patient at [facility] 02/12/24.                 Copied from CRM #[id number]. Topic: Clinical - Red Word Triage >> Nov 08, 2023  4:06 PM Turkey B wrote: [facility] that prompted transfer to Nurse Triage: Patient has blood in urine and severe pain on left side Reason for Disposition  Side (flank) or back pain present  Answer Assessment - Initial Assessment Questions Patient has been seen and evaluated by previous PCP yesterday and prescribed cipro for UTI. Patient calling to establish care with a new practice. Patient scheduled appt for new patient at [facility] 02/12/24. Recommended patient continue cipro course and drink fluids and call back if sx continue after antibiotics completed.       1. COLOR of URINE: Describe the color of the urine.  (e.g., tea-colored, pink, red, bloody) Do you have blood clots in your urine? (e.g., none, pea, grape, small coin)     Blood in urine 2. ONSET: When did the bleeding start?      Reports greater than 1 year but noted recently.  3. EPISODES: How many times has there been blood in the urine? or How many times today?     Na  4. PAIN with URINATION: Is there any pain with passing your urine? If Yes, ask: How bad is the pain?  (Scale 1-10; or mild, moderate, severe)     Na 5. FEVER: Do you have a fever? If Yes, ask: What is your temperature, how was it measured, and when did it start?     No  6. ASSOCIATED SYMPTOMS: Are you passing urine more frequently than usual?     Na  7. OTHER SYMPTOMS: Do you have any other symptoms? (e.g., back/flank pain, abdomen pain, vomiting)     Left flank pain, reports pink noted after wiping. Has been seen by PCP yesterday and now retiring.  8. PREGNANCY: Is there any chance you are pregnant? When was your last menstrual period?      na  Protocols used: Urine - Blood In-A-AH

## 2023-11-27 DIAGNOSIS — J9620 Acute and chronic respiratory failure, unspecified whether with hypoxia or hypercapnia: Secondary | ICD-10-CM | POA: Diagnosis not present

## 2023-11-30 DIAGNOSIS — Z8781 Personal history of (healed) traumatic fracture: Secondary | ICD-10-CM | POA: Diagnosis not present

## 2023-11-30 DIAGNOSIS — M25552 Pain in left hip: Secondary | ICD-10-CM | POA: Diagnosis not present

## 2023-11-30 DIAGNOSIS — R531 Weakness: Secondary | ICD-10-CM | POA: Diagnosis not present

## 2023-12-07 DIAGNOSIS — E1140 Type 2 diabetes mellitus with diabetic neuropathy, unspecified: Secondary | ICD-10-CM | POA: Diagnosis not present

## 2023-12-07 DIAGNOSIS — J449 Chronic obstructive pulmonary disease, unspecified: Secondary | ICD-10-CM | POA: Diagnosis not present

## 2023-12-07 DIAGNOSIS — E1165 Type 2 diabetes mellitus with hyperglycemia: Secondary | ICD-10-CM | POA: Diagnosis not present

## 2023-12-07 DIAGNOSIS — G894 Chronic pain syndrome: Secondary | ICD-10-CM | POA: Diagnosis not present

## 2023-12-07 DIAGNOSIS — E2749 Other adrenocortical insufficiency: Secondary | ICD-10-CM | POA: Diagnosis not present

## 2023-12-07 DIAGNOSIS — I5032 Chronic diastolic (congestive) heart failure: Secondary | ICD-10-CM | POA: Diagnosis not present

## 2024-02-12 ENCOUNTER — Ambulatory Visit: Admitting: Physician Assistant

## 2024-03-05 ENCOUNTER — Ambulatory Visit: Payer: Self-pay

## 2024-03-05 NOTE — Telephone Encounter (Signed)
 FYI Only or Action Required?: FYI only for provider: recommended to call her cardiology office and make an appointment.  Scheduled for a new patient appointment prior to transfer to Nurse Triage  Called Nurse Triage reporting Joint Swelling.  Symptoms began several weeks ago.  Interventions attempted: Rest, hydration, or home remedies.  Symptoms are: unchanged.  Triage Disposition: See PCP When Office is Open (Within 3 Days)  Patient/caregiver understands and will follow disposition?: Yes  Copied from CRM #[id number]. Topic: Clinical - Red Word Triage >> Mar 05, 2024  2:29 PM Joesph NOVAK wrote: Red Word that prompted transfer to Nurse Triage: Ankles are swelling, pain in her hips as well. Reason for Disposition  [1] MILD swelling of both ankles (e.g., ankle joints look swollen; or bilateral mild pedal edema) AND [2] new-onset or getting worse  (Exceptions: Caused by hot weather, already seen by doctor or NP/PA for this.)  Answer Assessment - Initial Assessment Questions Patient reports bilateral ankle swelling which is located on the outer part of the ankle. Swelling is chronic which is mild to moderate. Patient is recommended to call her cardiologist to get an appointment. Patient was scheduled for a new patient appointment prior to transfer to Nurse Triage.   1. LOCATION: Which ankle is swollen? Where is the swelling?     Both ankles-swelling is located on the outer part of the ankle 2. ONSET: When did the swelling start?     Chronic ankle 3. SWELLING: How bad is the swelling? Or, How large is it? (e.g., mild, moderate, severe; size of localized swelling)      Mild to moderate 4. PAIN: Is there any pain? If Yes, ask: How bad is it? (Scale 0-10; or none, mild, moderate, severe)     No  5. CAUSE: What do you think caused the ankle swelling?     CHF 6. OTHER SYMPTOMS: Do you have any other symptoms? (e.g., fever, chest pain, difficulty breathing, calf pain)     Chest  pain yesterday.  Protocols used: Ankle Swelling-A-AH

## 2024-03-25 ENCOUNTER — Ambulatory Visit: Payer: Self-pay | Admitting: Family Medicine

## 2024-04-16 ENCOUNTER — Ambulatory Visit: Payer: Self-pay

## 2024-04-16 NOTE — Telephone Encounter (Signed)
 Call was disconnected. Writer attempted to call the pt back - unable to leave voicemail.  Pt calling in stating she forget to tell the other triage nurse she spoke with today that she has itching on her ankle that is severe - onset 1 week. Little bumps  Pt states she has been washing the area, applying lotion, using Polysporin/neosporin and still itches. Pt states the itching keep her up at night.  Reason for Disposition  [1] MODERATE-SEVERE local itching (i.e., interferes with work, school, activities) AND [2] not improved after 24 hours of hydrocortisone  cream  Protocols used: Itching - Localized-A-AH

## 2024-04-16 NOTE — Telephone Encounter (Signed)
 This encounter was created in error - please disregard.

## 2024-04-16 NOTE — Addendum Note (Signed)
 Addended by: Mckoy Bhakta L on: 04/16/2024 04:05 PM   Modules accepted: Orders, Level of Service

## 2024-04-16 NOTE — Telephone Encounter (Signed)
 FYI Only or Action Required?: FYI only for provider: Home care, already seeing a provider for hip pain.  Patient was last seen in primary care on Not established.  Called Nurse Triage reporting Hip Pain.  Symptoms began several months ago.  Interventions attempted: OTC medications: Tylenol  and Prescription medications: Hydrocodone -acetaminophen  .  Symptoms are: unchanged.  Triage Disposition: See PCP Within 2 Weeks  Patient/caregiver understands and will follow disposition?: Yes . Reason for Disposition  Hip pain is a chronic symptom (recurrent or ongoing AND present > 4 weeks)  Answer Assessment - Initial Assessment Questions Seen a provider yesterday for it and was denied a cortisone injection, stated she should be ok to manage with pain medication and tylenol . Pt takes hydrocodone -acetaminophen , some relief. Takes tylenol  at night. New pt appointment already scheduled for 06/18/24, patient wanted sooner appointment, advised there are none, offered sooner at another location and patient denied. Advised UC if symptoms are severe. Given office number to [facility] [place] if patient wants to call back and schedule a sooner new pt appointment   1. LOCATION and RADIATION: Where is the pain located? Does the pain spread (shoot) anywhere else?     Right hip, sometimes radiates down the leg  2. QUALITY: What does the pain feel like?  (e.g., sharp, dull, aching, burning)     Sharp  3. SEVERITY: How bad is the pain? What does it keep you from doing?   (Scale 1-10; or mild, moderate, severe)     9/10  4. ONSET: When did the pain start? Does it come and go, or is it there all the time?     Chronic, but worsening last 3-4 months  5. WORK OR EXERCISE: Has there been any recent work or exercise that involved this part of the body?      Denies  6. CAUSE: What do you think is causing the hip pain?      Unsure, dx of spinal stenosis  7. AGGRAVATING FACTORS: What makes the hip  pain worse? (e.g., walking, climbing stairs, running)     Walking, sitting, any pressure on it  8. OTHER SYMPTOMS: Do you have any other symptoms? (e.g., back pain, pain shooting down leg,  fever, rash)     Denies  Protocols used: Hip Pain-A-AH  Copied from CRM #[id number]. Topic: Clinical - Red Word Triage >> Apr 16, 2024 11:52 AM Joesph NOVAK wrote: Red Word that prompted transfer to Nurse Triage: Hip pain, hard to walk on. Excruciating pain.  Eye has a red mark.

## 2024-04-16 NOTE — Telephone Encounter (Signed)
 FYI Only or Action Required?: FYI only for provider: advised patient to seek care at her previous PCP or urgent care within next 2-3 days.  Patient was last seen in primary care on N/A.  Called Nurse Triage reporting Hip Pain and Pruritis.  Symptoms began a week ago.  Interventions attempted: Other: Polysporin,Compression pumps.  Symptoms are: stable.  Triage Disposition: See PCP When Office is Open (Within 3 Days)  Patient/caregiver understands and will follow disposition?: Yes                 Reason for Disposition  Localized rash present > 7 days  Answer Assessment - Initial Assessment Questions 1. APPEARANCE of RASH: What does the rash look like? (e.g., blisters, dry flaky skin, red spots, redness, sores)     Redness, with little bumps (not blisters).  2. LOCATION: Where is the rash located?      Right shin to right knee.  3. NUMBER: How many spots are there?      Not sure.  4. SIZE: How big are the spots? (e.g., inches, cm; or compare to size of pinhead, tip of pen, eraser, pea)      4-6 inches long.  5. ONSET: When did the rash start?      1 week ago. Doesn't think it is spreading.  6. ITCHING: Does the rash itch? If Yes, ask: How bad is the itch?  (Scale 0-10; or none, mild, moderate, severe)     Yes;  severe at times.  7. PAIN: Does the rash hurt? If Yes, ask: How bad is the pain?  (Scale 0-10; or none, mild, moderate, severe)     No.  8. OTHER SYMPTOMS: Do you have any other symptoms? (e.g., fever)     Woke up yesterday morning knot up under it looks almost bluish, like a vessel busted in my eye left eye. Was seen yesterday by provider and treated with prednisolone 1% eye drops. Forgot to mention her leg symptom to provider yesterday.  No new or worsening difficulty breathing, chest pain, fever   9. PREGNANCY: Is there any chance you are pregnant? When was your last menstrual period?     N/A  Protocols used: Rash or  Redness - Localized-A-AH

## 2024-04-16 NOTE — Telephone Encounter (Signed)
 Duplicate encounter, please see nurse triage note today.

## 2024-04-25 ENCOUNTER — Emergency Department (HOSPITAL_COMMUNITY)

## 2024-04-25 ENCOUNTER — Other Ambulatory Visit: Payer: Self-pay

## 2024-04-25 ENCOUNTER — Emergency Department (HOSPITAL_COMMUNITY): Admission: EM | Admit: 2024-04-25 | Discharge: 2024-04-25 | Disposition: A | Discharge status: Home / Self Care

## 2024-04-25 ENCOUNTER — Encounter (HOSPITAL_COMMUNITY): Payer: Self-pay | Admitting: *Deleted

## 2024-04-25 DIAGNOSIS — M25551 Pain in right hip: Secondary | ICD-10-CM | POA: Insufficient documentation

## 2024-04-25 DIAGNOSIS — Z79899 Other long term (current) drug therapy: Secondary | ICD-10-CM | POA: Diagnosis not present

## 2024-04-25 DIAGNOSIS — K5732 Diverticulitis of large intestine without perforation or abscess without bleeding: Secondary | ICD-10-CM | POA: Diagnosis not present

## 2024-04-25 DIAGNOSIS — K5792 Diverticulitis of intestine, part unspecified, without perforation or abscess without bleeding: Secondary | ICD-10-CM

## 2024-04-25 LAB — CBC WITH DIFFERENTIAL/PLATELET
Abs Immature Granulocytes: 0.05 K/uL (ref 0.00–0.07)
Basophils Absolute: 0.1 K/uL (ref 0.0–0.1)
Basophils Relative: 1 %
Eosinophils Absolute: 0.1 K/uL (ref 0.0–0.5)
Eosinophils Relative: 1 %
HCT: 42.4 % (ref 36.0–46.0)
Hemoglobin: 13.6 g/dL (ref 12.0–15.0)
Immature Granulocytes: 0 %
Lymphocytes Relative: 15 %
Lymphs Abs: 2 K/uL (ref 0.7–4.0)
MCH: 30.7 pg (ref 26.0–34.0)
MCHC: 32.1 g/dL (ref 30.0–36.0)
MCV: 95.7 fL (ref 80.0–100.0)
Monocytes Absolute: 0.5 K/uL (ref 0.1–1.0)
Monocytes Relative: 4 %
Neutro Abs: 10.3 K/uL — ABNORMAL HIGH (ref 1.7–7.7)
Neutrophils Relative %: 79 %
Platelets: 252 K/uL (ref 150–400)
RBC: 4.43 MIL/uL (ref 3.87–5.11)
RDW: 12.6 % (ref 11.5–15.5)
WBC: 13 K/uL — ABNORMAL HIGH (ref 4.0–10.5)
nRBC: 0 % (ref 0.0–0.2)

## 2024-04-25 LAB — COMPREHENSIVE METABOLIC PANEL WITH GFR
ALT: 17 U/L (ref 0–44)
AST: 20 U/L (ref 15–41)
Albumin: 4.2 g/dL (ref 3.5–5.0)
Alkaline Phosphatase: 58 U/L (ref 38–126)
Anion gap: 14 (ref 5–15)
BUN: 13 mg/dL (ref 8–23)
CO2: 26 mmol/L (ref 22–32)
Calcium: 9.3 mg/dL (ref 8.9–10.3)
Chloride: 103 mmol/L (ref 98–111)
Creatinine, Ser: 0.85 mg/dL (ref 0.44–1.00)
GFR, Estimated: >60 mL/min
Glucose, Bld: 154 mg/dL — ABNORMAL HIGH (ref 70–99)
Potassium: 4.4 mmol/L (ref 3.5–5.1)
Sodium: 144 mmol/L (ref 135–145)
Total Bilirubin: 0.3 mg/dL (ref 0.0–1.2)
Total Protein: 6.6 g/dL (ref 6.5–8.1)

## 2024-04-25 LAB — URINALYSIS, ROUTINE W REFLEX MICROSCOPIC
Bacteria, UA: NONE SEEN
Bilirubin Urine: NEGATIVE
Glucose, UA: NEGATIVE mg/dL
Ketones, ur: NEGATIVE mg/dL
Leukocytes,Ua: NEGATIVE
Nitrite: NEGATIVE
Protein, ur: NEGATIVE mg/dL
Specific Gravity, Urine: 1.013 (ref 1.005–1.030)
pH: 6 (ref 5.0–8.0)

## 2024-04-25 LAB — LIPASE, BLOOD: Lipase: 29 U/L (ref 11–51)

## 2024-04-25 MED ORDER — IOHEXOL 300 MG/ML  SOLN
100.0000 mL | Freq: Once | INTRAMUSCULAR | Status: AC | PRN
  Administered 2024-04-25: 100 mL via INTRAVENOUS

## 2024-04-25 MED ORDER — HYDROCODONE-ACETAMINOPHEN 10-325 MG PO TABS
1.0000 | ORAL_TABLET | Freq: Once | ORAL | Status: AC
  Administered 2024-04-25: 1 via ORAL
  Filled 2024-04-25: qty 1

## 2024-04-25 MED ORDER — AMOXICILLIN-POT CLAVULANATE 875-125 MG PO TABS: 1.0000 | ORAL_TABLET | Freq: Two times a day (BID) | ORAL | 0 refills | Status: AC

## 2024-04-25 MED ORDER — CYCLOBENZAPRINE HCL 10 MG PO TABS: 5.0000 mg | ORAL_TABLET | Freq: Two times a day (BID) | ORAL | 0 refills | Status: AC | PRN

## 2024-04-25 NOTE — ED Provider Notes (Signed)
 " [place] EMERGENCY DEPARTMENT AT [facility] Provider Note   CSN: [id number] Arrival date & time: 04/25/24  1501     Patient presents with: Hip Pain   Gina Bradley is a 64 y.o. female.    Hip Pain Pertinent negatives include no chest pain, no abdominal pain and no shortness of breath.  Presents because of  right hip pain as well as right back pain and right lower abdominal pain.  Patient states that she has had chronic hip pain.  Takes opioids prescribed by PCP.  Is not follow-up with orthopedic surgery.  Patient states this has been over chronic pain but increasing in nature over the past 2 weeks.  Patient also endorsing some right lower back pain.  Also has some shooting pain down the anterior aspect of her right leg at times.  Also endorsing some right lower abdominal pain as well.  Only previous history of surgical of the abdomen is C-section.  No other abdominal surgeries.  Still passing flatulence.  Slight constipation with bowel movements about every 2 to 3 days but otherwise still producing stool.  No nausea no vomiting.  No fever no chills.  No chest pain or shortness of breath.  No pleuritic chest pain or hemoptysis.  No exertional chest pain.    Prior to Admission medications  Medication Sig Start Date End Date Taking? Authorizing Provider  amoxicillin -clavulanate (AUGMENTIN ) 875-125 MG tablet Take 1 tablet by mouth every 12 (twelve) hours for 10 days. 04/25/24 05/05/24 Yes Simon Lavonia SAILOR, MD  cyclobenzaprine  (FLEXERIL ) 10 MG tablet Take 0.5 tablets (5 mg total) by mouth 2 (two) times daily as needed for muscle spasms. 04/25/24  Yes Simon Lavonia SAILOR, MD  acetaminophen  (TYLENOL ) 325 MG tablet Take 2 tablets (650 mg total) by mouth every 4 (four) hours as needed for mild pain or fever (temp > 101.5). 08/04/21   Elgergawy, Brayton RAMAN, MD  albuterol  (PROVENTIL  HFA;VENTOLIN  HFA) 108 (90 Base) MCG/ACT inhaler Inhale 1 puff into the lungs every 6 (six) hours as needed for wheezing  or shortness of breath.    [provider]  albuterol  (PROVENTIL ) (2.5 MG/3ML) 0.083% nebulizer solution Take 2.5 mg by nebulization daily as needed.    [provider]  ANORO ELLIPTA  62.5-25 MCG/INH AEPB Inhale 1 puff into the lungs daily.  09/09/16   [provider]  aspirin  EC 81 MG tablet Take 1 tablet (81 mg total) by mouth daily with breakfast. 09/04/21   Pearlean Manus, MD  calcium  carbonate (TUMS - DOSED IN MG ELEMENTAL CALCIUM ) 500 MG chewable tablet Chew 1 tablet (200 mg of elemental calcium  total) by mouth 3 (three) times daily as needed for indigestion or heartburn. 08/04/21   Elgergawy, Brayton RAMAN, MD  clonazePAM  (KLONOPIN ) 0.25 MG disintegrating tablet Take 1 tablet (0.25 mg total) by mouth 2 (two) times daily. 08/04/21   Elgergawy, Brayton RAMAN, MD  DULoxetine  (CYMBALTA ) 20 MG capsule Take 1 capsule (20 mg total) by mouth daily. 08/05/21   Elgergawy, Brayton RAMAN, MD  EPINEPHrine  (EPIPEN  2-PAK) 0.3 mg/0.3 mL IJ SOAJ injection Inject 0.3 mLs (0.3 mg total) into the muscle once as needed (for severe allergic reaction). CAll 911 immediately if you have to use this medicine 10/21/15   Cleotilde Rogue, MD  fluticasone  (FLONASE ) 50 MCG/ACT nasal spray Place 2 sprays into both nostrils daily.    [provider]  furosemide  (LASIX ) 20 MG tablet Take 1 tablet (20 mg total) by mouth daily as needed. For fluid/swelling  09/04/21   Pearlean Manus, MD  gabapentin  (NEURONTIN ) 300 MG capsule Take 1 capsule (300 mg total) by mouth every 8 (eight) hours. 08/04/21   Elgergawy, Brayton RAMAN, MD  glimepiride (AMARYL) 4 MG tablet Take 4 mg by mouth daily. 02/03/23   [provider]  HYDROcodone -acetaminophen  (NORCO) 10-325 MG tablet Take 1 tablet by mouth every 4 (four) hours. 02/10/23   [provider]  insulin  aspart (NOVOLOG ) 100 UNIT/ML injection Inject 0-9 Units into the skin 3 (three) times daily with meals. 08/04/21   Elgergawy, Brayton RAMAN, MD  isosorbide  mononitrate (ISMO ) 10 MG  tablet Take 10 mg by mouth daily. 01/17/23   [provider]  levothyroxine (SYNTHROID) 25 MCG tablet Take 25 mcg by mouth daily. 02/15/23   [provider]  losartan  (COZAAR ) 25 MG tablet Take 25 mg by mouth daily. 03/06/23   [provider]  metoprolol  succinate (TOPROL -XL) 100 MG 24 hr tablet Take 1 tablet (100 mg total) by mouth daily. 03/01/23   Debera Jayson MATSU, MD  mupirocin ointment (BACTROBAN) 2 % Apply 1 Application topically 2 (two) times daily. 02/10/23   [provider]  nitroGLYCERIN  (NITROSTAT ) 0.4 MG SL tablet Place 1 tablet (0.4 mg total) under the tongue every 5 (five) minutes as needed for chest pain. 12/22/14   Lynwood Anes, MD  pantoprazole  (PROTONIX ) 40 MG tablet Take 40 mg by mouth daily. 05/25/13   [provider]  predniSONE  (DELTASONE ) 5 MG tablet Take 5 mg by mouth 2 (two) times daily. 02/11/23   [provider]  primidone  (MYSOLINE ) 50 MG tablet Take 50 mg by mouth at bedtime. 04/06/19   [provider]  rosuvastatin (CRESTOR) 10 MG tablet Take 10 mg by mouth daily. 02/22/23   [provider]    Allergies: Bee venom and Tdap [tetanus-diphth-acell pertussis]    Review of Systems  Constitutional:  Negative for chills and fever.  HENT:  Negative for ear pain and sore throat.   Eyes:  Negative for pain and visual disturbance.  Respiratory:  Negative for cough and shortness of breath.   Cardiovascular:  Negative for chest pain and palpitations.  Gastrointestinal:  Negative for abdominal pain and vomiting.  Genitourinary:  Negative for dysuria and hematuria.  Musculoskeletal:  Negative for arthralgias and back pain.  Skin:  Negative for color change and rash.  Neurological:  Negative for seizures and syncope.  All other systems reviewed and are negative.   Updated Vital Signs BP (!) 121/54 (BP Location: Right Arm)   Pulse 62   Temp 98.2 F (36.8 C) (Oral)   Resp 18   Ht 4' 9 (1.448 m)   Wt 90.7  kg   LMP 01/05/2015   SpO2 96%   BMI 43.28 kg/m   Physical Exam Vitals and nursing note reviewed.  Constitutional:      General: She is not in acute distress.    Appearance: She is well-developed.  HENT:     Head: Normocephalic and atraumatic.  Eyes:     Conjunctiva/sclera: Conjunctivae normal.  Cardiovascular:     Rate and Rhythm: Normal rate and regular rhythm.     Heart sounds: No murmur heard. Pulmonary:     Effort: Pulmonary effort is normal. No respiratory distress.     Breath sounds: Normal breath sounds.  Abdominal:     Palpations: Abdomen is soft.     Tenderness: There is no abdominal tenderness.  Musculoskeletal:        General: No swelling.  Cervical back: Neck supple.       Legs:  Skin:    General: Skin is warm and dry.     Capillary Refill: Capillary refill takes less than 2 seconds.  Neurological:     Mental Status: She is alert.  Psychiatric:        Mood and Affect: Mood normal.     (all labs ordered are listed, but only abnormal results are displayed) Labs Reviewed  CBC WITH DIFFERENTIAL/PLATELET - Abnormal; Notable for the following components:      Result Value   WBC 13.0 (*)    Neutro Abs 10.3 (*)    All other components within normal limits  COMPREHENSIVE METABOLIC PANEL WITH GFR - Abnormal; Notable for the following components:   Glucose, Bld 154 (*)    All other components within normal limits  URINALYSIS, ROUTINE W REFLEX MICROSCOPIC - Abnormal; Notable for the following components:   Hgb urine dipstick LARGE (*)    All other components within normal limits  LIPASE, BLOOD    EKG: None  Radiology: CT ABDOMEN PELVIS W CONTRAST Result Date: 04/25/2024 CLINICAL DATA:  Right lower quadrant pain. EXAM: CT ABDOMEN AND PELVIS WITH CONTRAST TECHNIQUE: Multidetector CT imaging of the abdomen and pelvis was performed using the standard protocol following bolus administration of intravenous contrast. RADIATION DOSE REDUCTION: This exam was  performed according to the departmental dose-optimization program which includes automated exposure control, adjustment of the mA and/or kV according to patient size and/or use of iterative reconstruction technique. CONTRAST:  OMNIPAQUE  IOHEXOL  300 MG/ML  SOLN COMPARISON:  Sep 01, 2021 FINDINGS: Lower chest: No acute abnormality. Hepatobiliary: No focal liver abnormality is seen. Tiny gallstones are suspected within the contracted gallbladder. There is no evidence of gallbladder wall thickening or biliary dilatation. Pancreas: Unremarkable. No pancreatic ductal dilatation or surrounding inflammatory changes. Spleen: Normal in size without focal abnormality. Adrenals/Urinary Tract: Adrenal glands are unremarkable. Kidneys are normal in size, without renal calculi or hydronephrosis. Stable bilateral simple renal cysts are seen. Bladder is unremarkable. Stomach/Bowel: Stomach is within normal limits. Appendix appears normal. No evidence of bowel dilatation. Numerous diverticula are seen throughout a redundant sigmoid colon. Mild thickening of the mid sigmoid colon is also noted. Vascular/Lymphatic: Aortic atherosclerosis. No enlarged abdominal or pelvic lymph nodes. Reproductive: Uterus and bilateral adnexa are unremarkable. Other: No abdominal wall hernia or abnormality. No abdominopelvic ascites. Musculoskeletal: Multilevel degenerative changes are seen throughout the lumbar spine. IMPRESSION: 1. Sigmoid diverticulosis with mild thickening of the mid sigmoid colon which may represent sequelae associated with mild diverticulitis. 2. Cholelithiasis. 3. Stable bilateral simple renal cysts. 4. Aortic atherosclerosis. Electronically Signed   By: Suzen Dials M.D.   On: 04/25/2024 19:08   DG Hip Unilat W or Wo Pelvis 2-3 Views Right Result Date: 04/25/2024 CLINICAL DATA:  Right-sided hip pain EXAM: DG HIP (WITH OR WITHOUT PELVIS) 2-3V RIGHT COMPARISON:  06/06/2021 FINDINGS: No acute fracture or malalignment.  Old pubic rami fractures. SI joints are non widened. IMPRESSION: No acute osseous abnormality. Old pubic rami fractures. Electronically Signed   By: Luke Bun M.D.   On: 04/25/2024 15:47     Procedures   Medications Ordered in the ED  HYDROcodone -acetaminophen  (NORCO) 10-325 MG per tablet 1 tablet (has no administration in time range)  iohexol  (OMNIPAQUE ) 300 MG/ML solution 100 mL (100 mLs Intravenous Contrast Given 04/25/24 1832)  Medical Decision Making Amount and/or Complexity of Data Reviewed Labs: ordered. Radiology: ordered.  Risk Prescription drug management.     HPI:   Presents because of  right hip pain as well as right back pain and right lower abdominal pain.  Patient states that she has had chronic hip pain.  Takes opioids prescribed by PCP.  Is not follow-up with orthopedic surgery.  Patient states this has been over chronic pain but increasing in nature over the past 2 weeks.  Patient also endorsing some right lower back pain.  Also has some shooting pain down the anterior aspect of her right leg at times.  Also endorsing some right lower abdominal pain as well.  Only previous history of surgical of the abdomen is C-section.  No other abdominal surgeries.  Still passing flatulence.  Slight constipation with bowel movements about every 2 to 3 days but otherwise still producing stool.  No nausea no vomiting.  No fever no chills.  No chest pain or shortness of breath.  No pleuritic chest pain or hemoptysis.  No exertional chest pain.   MDM:   Upon reexamination, patient hemodynamically stable.  Remains A&O x 3 with GCS 15.  In terms of right lower quadrant pain.  Could be referred pain from back/hip.  She does have some tenderness in some right lower quadrant.  No previous history of appendectomy.  Will obtain laboratory workup and rule out appendicitis with CT scan.  Will also obtain UA to rule out any kind of evidence of UTI or  pyelonephritis.   Obtain laboratory workup.  Assess for large electrolyte derangement.  X-ray of the right hip shows old pubic rami fractures otherwise no acute pathology.  No acute fractures.  Could be source of pain.  Otherwise, no concerns for occult fracture.  No falls.  Patient also endorsing some right lower back pain.  Chronic in nature for her.  No bowel or bladder incontinence.  No saddle anesthesia.  No concerns for cauda equina or other spinal cord emergency.  Could be contributing to right hip pain as well as sharp shooting pain down the anterior aspect of her right leg.   Reevaluation:   Urine shows no UTI.  Has a blood in the urine.  Leukocytosis but otherwise no large electrolyte derangements.  No AKI.  CT scan consistent for diverticulitis.  Will start patient on Augmentin  outpatient.  Follow-up with gastroenterology.  No previous colonoscopies in the past.  Counseled on medication as well as dietary changes.  Right hip x-ray unremarkable.  I think this is either some degenerative changes of the right hip causing pain versus referral of right lower back pain to the right hip.  She can follow-up with orthopedic surgery outpatient.  Patient already on high dose pain medication at home.  Did add on Flexeril .  Recommend lidocaine  patches as well as Tylenol .   I have independently interpreted the XR  and CT  images and agree with the radiologist finding   Social Determinant of Health: chronic pain    Disposition and Follow Up: ortho, GI       Final diagnoses:  Right hip pain  Diverticulitis    ED Discharge Orders          Ordered    amoxicillin -clavulanate (AUGMENTIN ) 875-125 MG tablet  Every 12 hours        04/25/24 2027    cyclobenzaprine  (FLEXERIL ) 10 MG tablet  2 times daily PRN        04/25/24 2059  Simon Lavonia SAILOR, MD 04/25/24 2101  "

## 2024-04-25 NOTE — ED Notes (Signed)
 Patient transported to X-ray

## 2024-04-25 NOTE — ED Triage Notes (Signed)
 Pt with right hip x 1 week. Pain got worse and makes it hard to ambulate.  Denies any N/V/D.

## 2024-04-25 NOTE — Discharge Instructions (Addendum)
 You do have diverticulitis which is inflammation of your descending colon.  This can happen for couple different reasons.  We did start you on Augmentin .  Please go over the above information about what foods to avoid.  Follow-up with gastroenterology about this.  If you have any kind of fever or chills please come back to the ED.  In terms of your right hip pain.  Please follow-up with orthopedic surgery.  For pain, you can take 1000 mg of Tylenol  or 1 g of Tylenol  every 6-8 hours.  Do not exceed more than 4000 mg or 4 g in a 24-hour period.  Please take the Flexeril  to see this helps.  You can use lidocaine  patches

## 2024-05-08 ENCOUNTER — Ambulatory Visit: Admitting: Orthopedic Surgery

## 2024-05-08 NOTE — Progress Notes (Unsigned)
" ° °  Cardiology Office Note    Date:  05/08/2024  ID:  Gina Bradley, DOB November 24, 1960, MRN [id number] Cardiologist: Jayson Sierras, MD Electrophysiologist:  Danelle Birmingham, MD { :  History of Present Illness:    Gina Bradley is a 64 y.o. female with past medical history of PSVT, Type 2 DM, HTN, HLD, lymphedema and COPD who presents to the office today for annual follow-up.  She was last examined by Dr. Sierras in 01/2023 and denied any recent palpitations or chest pain at that time.  She was wearing compression stockings on a daily basis and using a compression device machine 3 times a week for lymphedema.  No changes were made to her cardiac medications and she was continued on ASA 81 mg daily, Lasix  20 mg as needed and Toprol -XL 100 mg daily.  She was evaluated at [facility] ED last month for hip pain and lower abdominal pain. Was found to have diverticulitis and was started on antibiotic therapy.   ROS: ***  Studies Reviewed:   EKG: EKG is*** ordered today and demonstrates ***   EKG Interpretation Date/Time:    Ventricular Rate:    PR Interval:    QRS Duration:    QT Interval:    QTC Calculation:   R Axis:      Text Interpretation:         Echocardiogram: 06/2021 IMPRESSIONS     1. Left ventricular ejection fraction, by estimation, is 65 to 70%. The  left ventricle has normal function. The left ventricle has no regional  wall motion abnormalities. Left ventricular diastolic parameters are  indeterminate.   2. Right ventricular systolic function is normal. The right ventricular  size is normal. Tricuspid regurgitation signal is inadequate for assessing  PA pressure.   3. The mitral valve is normal in structure. No evidence of mitral valve  regurgitation.   4. The aortic valve was not well visualized. Aortic valve regurgitation  is not visualized. No aortic stenosis is present.   Risk Assessment/Calculations:   {Does this patient have ATRIAL  FIBRILLATION?[phone]} No BP recorded.  {Refresh Note OR Click here to enter BP  :1}***         Physical Exam:   VS:  LMP 01/05/2015    Wt Readings from Last 3 Encounters:  04/25/24 200 lb (90.7 kg)  02/02/23 216 lb (98 kg)  01/02/23 210 lb (95.3 kg)     GEN: Well nourished, well developed in no acute distress NECK: No JVD; No carotid bruits CARDIAC: ***RRR, no murmurs, rubs, gallops RESPIRATORY:  Clear to auscultation without rales, wheezing or rhonchi  ABDOMEN: Appears non-distended. No obvious abdominal masses. EXTREMITIES: No clubbing or cyanosis. No edema.  Distal pedal pulses are 2+ bilaterally.   Assessment and Plan:      {Are you ordering a CV Procedure (e.g. stress test, cath, DCCV, TEE, etc)?   Press F2        :[id number]}   Signed, Laymon CHRISTELLA Qua, PA-C   "

## 2024-05-09 ENCOUNTER — Ambulatory Visit: Admitting: Student

## 2024-05-10 ENCOUNTER — Encounter: Payer: Self-pay | Admitting: Internal Medicine

## 2024-05-15 ENCOUNTER — Ambulatory Visit: Admitting: Student

## 2024-06-18 ENCOUNTER — Ambulatory Visit: Payer: Self-pay
